# Patient Record
Sex: Female | Born: 1935 | Race: White | Hispanic: No | Marital: Married | Smoking: Never smoker
Health system: Southern US, Community
[De-identification: ages and names within clinical notes are randomized; demographics above are authoritative.]

## PROBLEM LIST (undated history)

## (undated) DIAGNOSIS — I4891 Unspecified atrial fibrillation: Secondary | ICD-10-CM

## (undated) DIAGNOSIS — J449 Chronic obstructive pulmonary disease, unspecified: Secondary | ICD-10-CM

## (undated) DIAGNOSIS — E079 Disorder of thyroid, unspecified: Secondary | ICD-10-CM

## (undated) DIAGNOSIS — M419 Scoliosis, unspecified: Secondary | ICD-10-CM

## (undated) DIAGNOSIS — N189 Chronic kidney disease, unspecified: Secondary | ICD-10-CM

## (undated) DIAGNOSIS — G43909 Migraine, unspecified, not intractable, without status migrainosus: Secondary | ICD-10-CM

## (undated) DIAGNOSIS — Z95810 Presence of automatic (implantable) cardiac defibrillator: Secondary | ICD-10-CM

## (undated) DIAGNOSIS — I340 Nonrheumatic mitral (valve) insufficiency: Secondary | ICD-10-CM

## (undated) DIAGNOSIS — E785 Hyperlipidemia, unspecified: Secondary | ICD-10-CM

## (undated) DIAGNOSIS — I472 Ventricular tachycardia, unspecified: Secondary | ICD-10-CM

## (undated) DIAGNOSIS — D649 Anemia, unspecified: Secondary | ICD-10-CM

## (undated) DIAGNOSIS — I499 Cardiac arrhythmia, unspecified: Secondary | ICD-10-CM

## (undated) DIAGNOSIS — G894 Chronic pain syndrome: Secondary | ICD-10-CM

## (undated) DIAGNOSIS — I5022 Chronic systolic (congestive) heart failure: Secondary | ICD-10-CM

## (undated) DIAGNOSIS — K900 Celiac disease: Secondary | ICD-10-CM

## (undated) DIAGNOSIS — Z950 Presence of cardiac pacemaker: Secondary | ICD-10-CM

## (undated) DIAGNOSIS — I420 Dilated cardiomyopathy: Secondary | ICD-10-CM

## (undated) DIAGNOSIS — L570 Actinic keratosis: Secondary | ICD-10-CM

## (undated) DIAGNOSIS — M810 Age-related osteoporosis without current pathological fracture: Secondary | ICD-10-CM

## (undated) DIAGNOSIS — J3489 Other specified disorders of nose and nasal sinuses: Secondary | ICD-10-CM

## (undated) DIAGNOSIS — I10 Essential (primary) hypertension: Secondary | ICD-10-CM

## (undated) DIAGNOSIS — H269 Unspecified cataract: Secondary | ICD-10-CM

## (undated) DIAGNOSIS — T7840XA Allergy, unspecified, initial encounter: Secondary | ICD-10-CM

## (undated) DIAGNOSIS — K219 Gastro-esophageal reflux disease without esophagitis: Secondary | ICD-10-CM

## (undated) DIAGNOSIS — E039 Hypothyroidism, unspecified: Secondary | ICD-10-CM

## (undated) DIAGNOSIS — I469 Cardiac arrest, cause unspecified: Secondary | ICD-10-CM

## (undated) DIAGNOSIS — R010 Benign and innocent cardiac murmurs: Secondary | ICD-10-CM

## (undated) DIAGNOSIS — Z5189 Encounter for other specified aftercare: Secondary | ICD-10-CM

## (undated) DIAGNOSIS — I359 Nonrheumatic aortic valve disorder, unspecified: Secondary | ICD-10-CM

## (undated) DIAGNOSIS — M799 Soft tissue disorder, unspecified: Secondary | ICD-10-CM

## (undated) DIAGNOSIS — M545 Low back pain, unspecified: Secondary | ICD-10-CM

## (undated) DIAGNOSIS — J984 Other disorders of lung: Secondary | ICD-10-CM

## (undated) DIAGNOSIS — Z4789 Encounter for other orthopedic aftercare: Secondary | ICD-10-CM

## (undated) DIAGNOSIS — R519 Headache, unspecified: Secondary | ICD-10-CM

## (undated) DIAGNOSIS — K259 Gastric ulcer, unspecified as acute or chronic, without hemorrhage or perforation: Secondary | ICD-10-CM

## (undated) DIAGNOSIS — M1990 Unspecified osteoarthritis, unspecified site: Secondary | ICD-10-CM

## (undated) DIAGNOSIS — I341 Nonrheumatic mitral (valve) prolapse: Secondary | ICD-10-CM

## (undated) DIAGNOSIS — I351 Nonrheumatic aortic (valve) insufficiency: Secondary | ICD-10-CM

## (undated) DIAGNOSIS — J349 Unspecified disorder of nose and nasal sinuses: Secondary | ICD-10-CM

## (undated) DIAGNOSIS — J392 Other diseases of pharynx: Secondary | ICD-10-CM

## (undated) DIAGNOSIS — I429 Cardiomyopathy, unspecified: Secondary | ICD-10-CM

## (undated) DIAGNOSIS — R0602 Shortness of breath: Secondary | ICD-10-CM

## (undated) DIAGNOSIS — R0609 Other forms of dyspnea: Secondary | ICD-10-CM

## (undated) DIAGNOSIS — H9390 Unspecified disorder of ear, unspecified ear: Secondary | ICD-10-CM

## (undated) DIAGNOSIS — R011 Cardiac murmur, unspecified: Secondary | ICD-10-CM

## (undated) DIAGNOSIS — R262 Difficulty in walking, not elsewhere classified: Secondary | ICD-10-CM

## (undated) DIAGNOSIS — H539 Unspecified visual disturbance: Secondary | ICD-10-CM

## (undated) DIAGNOSIS — I879 Disorder of vein, unspecified: Secondary | ICD-10-CM

## (undated) DIAGNOSIS — I509 Heart failure, unspecified: Secondary | ICD-10-CM

## (undated) DIAGNOSIS — J189 Pneumonia, unspecified organism: Secondary | ICD-10-CM

## (undated) HISTORY — DX: Age-related osteoporosis without current pathological fracture: M81.0

## (undated) HISTORY — DX: Hyperlipidemia, unspecified: E78.5

## (undated) HISTORY — DX: Unspecified cataract: H26.9

## (undated) HISTORY — PX: PACEMAKER IMPLANT: EP1218

## (undated) HISTORY — DX: Anemia, unspecified: D64.9

## (undated) HISTORY — DX: Cardiac arrhythmia, unspecified: I49.9

## (undated) HISTORY — DX: Unspecified atrial fibrillation: I48.91

## (undated) HISTORY — DX: Actinic keratosis: L57.0

## (undated) HISTORY — DX: Chronic kidney disease, unspecified: N18.9

## (undated) HISTORY — DX: Essential (primary) hypertension: I10

## (undated) HISTORY — DX: Other specified disorders of nose and nasal sinuses: J34.89

## (undated) HISTORY — DX: Scoliosis, unspecified: M41.9

## (undated) HISTORY — DX: Chronic pain syndrome: G89.4

## (undated) HISTORY — DX: Allergy, unspecified, initial encounter: T78.40XA

## (undated) HISTORY — DX: Chronic systolic (congestive) heart failure: I50.22

## (undated) HISTORY — DX: Ventricular tachycardia: I47.2

## (undated) HISTORY — PX: CORONARY ARTERY BYPASS GRAFT: SHX141

## (undated) HISTORY — DX: Celiac disease: K90.0

## (undated) HISTORY — DX: Ventricular tachycardia, unspecified: I47.20

## (undated) HISTORY — DX: Nonrheumatic mitral (valve) insufficiency: I34.0

## (undated) HISTORY — DX: Disorder of thyroid, unspecified: E07.9

## (undated) HISTORY — DX: Migraine, unspecified, not intractable, without status migrainosus: G43.909

## (undated) HISTORY — PX: OTHER SURGICAL HISTORY: SHX169

## (undated) HISTORY — DX: Dilated cardiomyopathy: I42.0

## (undated) HISTORY — PX: CARDIAC SURGERY: SHX584

## (undated) HISTORY — DX: Chronic obstructive pulmonary disease, unspecified: J44.9

## (undated) HISTORY — DX: Cardiac murmur, unspecified: R01.1

## (undated) HISTORY — DX: Other diseases of pharynx: J39.2

## (undated) HISTORY — DX: Soft tissue disorder, unspecified: M79.9

## (undated) HISTORY — DX: Nonrheumatic mitral (valve) prolapse: I34.1

## (undated) HISTORY — PX: CARDIAC CATHETERIZATION: SHX172

## (undated) HISTORY — DX: Low back pain, unspecified: M54.50

## (undated) HISTORY — DX: Unspecified osteoarthritis, unspecified site: M19.90

## (undated) HISTORY — PX: TONSILLECTOMY AND ADENOIDECTOMY: SUR1326

## (undated) HISTORY — DX: Difficulty in walking, not elsewhere classified: R26.2

## (undated) HISTORY — DX: Unspecified disorder of nose and nasal sinuses: J34.9

## (undated) HISTORY — PX: APPENDECTOMY (OPEN): SHX54

## (undated) HISTORY — DX: Gastro-esophageal reflux disease without esophagitis: K21.9

## (undated) HISTORY — PX: CARDIOVERSION: SHX1299

## (undated) HISTORY — PX: ADENOIDECTOMY: SUR15

## (undated) HISTORY — DX: Cardiomyopathy, unspecified: I42.9

## (undated) HISTORY — DX: Headache, unspecified: R51.9

## (undated) HISTORY — DX: Unspecified disorder of ear, unspecified ear: H93.90

## (undated) HISTORY — DX: Disorder of vein, unspecified: I87.9

## (undated) HISTORY — DX: Nonrheumatic aortic (valve) insufficiency: I35.1

## (undated) HISTORY — DX: Unspecified visual disturbance: H53.9

## (undated) HISTORY — DX: Hypothyroidism, unspecified: E03.9

---
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
## 1995-02-01 ENCOUNTER — Ambulatory Visit: Admit: 1995-02-01 | Disposition: A | Discharge status: Home / Self Care | Payer: Self-pay | Source: Ambulatory Visit

## 1999-10-08 ENCOUNTER — Ambulatory Visit
Admit: 1999-10-08 | Disposition: A | Discharge status: Home / Self Care | Payer: Self-pay | Source: Ambulatory Visit | Admitting: Adult Reconstructive Orthopaedic Surgery

## 1999-11-14 ENCOUNTER — Ambulatory Visit: Admission: RE | Admit: 1999-11-14 | Payer: Self-pay | Source: Ambulatory Visit | Admitting: Gastroenterology

## 2000-10-04 ENCOUNTER — Ambulatory Visit
Admission: RE | Admit: 2000-10-04 | Payer: Self-pay | Source: Ambulatory Visit | Admitting: Adult Reconstructive Orthopaedic Surgery

## 2001-05-14 ENCOUNTER — Emergency Department: Admit: 2001-05-14 | Payer: Self-pay | Source: Emergency Department | Admitting: Emergency Medicine

## 2006-12-17 ENCOUNTER — Ambulatory Visit
Admit: 2006-12-17 | Disposition: A | Discharge status: Home / Self Care | Payer: Self-pay | Source: Ambulatory Visit | Admitting: Gastroenterology

## 2006-12-17 LAB — BASIC METABOLIC PANEL
BUN: 22 mg/dL — ABNORMAL HIGH (ref 8–20)
CO2: 28 mEq/L (ref 21–30)
Calcium: 9.5 mg/dL (ref 8.6–10.2)
Chloride: 106 mEq/L (ref 98–107)
Creatinine: 1.1 mg/dL (ref 0.6–1.5)
Glucose: 77 mg/dL (ref 70–100)
Potassium: 4.4 mEq/L (ref 3.6–5.0)
Sodium: 140 mEq/L (ref 136–146)

## 2006-12-17 LAB — CBC- CERNER
Hematocrit: 36.2 % — ABNORMAL LOW (ref 37.0–47.0)
Hgb: 12.4 G/DL (ref 12.0–16.0)
MCH: 31.2 PG (ref 28.0–32.0)
MCHC: 34.2 G/DL (ref 32.0–36.0)
MCV: 91 FL (ref 80.0–100.0)
MPV: 8.2 FL (ref 7.4–10.4)
Platelets: 187 /mm3 (ref 140–400)
RBC: 3.98 /mm3 — ABNORMAL LOW (ref 4.20–5.40)
RDW: 16 % — ABNORMAL HIGH (ref 11.5–15.0)
WBC: 6.4 /mm3 (ref 3.5–10.8)

## 2006-12-17 LAB — GFR

## 2006-12-30 ENCOUNTER — Ambulatory Visit: Admission: RE | Admit: 2006-12-30 | Payer: Self-pay | Source: Ambulatory Visit | Admitting: Gastroenterology

## 2007-10-06 ENCOUNTER — Ambulatory Visit
Admit: 2007-10-06 | Disposition: A | Discharge status: Home / Self Care | Payer: Self-pay | Source: Ambulatory Visit | Admitting: Gastroenterology

## 2007-10-06 LAB — CBC AND DIFFERENTIAL
Basophils Absolute: 0 /mm3 (ref 0.0–0.2)
Basophils: 0 % (ref 0–2)
Eosinophils Absolute: 0.1 /mm3 (ref 0.0–0.7)
Eosinophils: 1 % (ref 0–5)
Granulocytes Absolute: 3.8 /mm3 (ref 1.8–8.1)
Hematocrit: 39.2 % (ref 37.0–47.0)
Hgb: 12.4 G/DL (ref 12.0–16.0)
Immature Granulocytes Absolute: 0
Immature Granulocytes: 0 %
Lymphocytes Absolute: 1.8 /mm3 (ref 0.5–4.4)
Lymphocytes: 30 % (ref 15–41)
MCH: 31.7 PG (ref 28.0–32.0)
MCHC: 31.6 G/DL — ABNORMAL LOW (ref 32.0–36.0)
MCV: 100.3 FL — ABNORMAL HIGH (ref 80.0–100.0)
MPV: 10.6 FL (ref 9.4–12.3)
Monocytes Absolute: 0.3 /mm3 (ref 0.0–1.2)
Monocytes: 6 % (ref 0–11)
Neutrophils %: 63 % (ref 52–75)
Platelets: 198 /mm3 (ref 140–400)
RBC: 3.91 /mm3 — ABNORMAL LOW (ref 4.20–5.40)
RDW: 13.6 % (ref 11.5–15.0)
WBC: 5.96 /mm3 (ref 3.50–10.80)

## 2007-10-06 LAB — BASIC METABOLIC PANEL
BUN: 22 mg/dL — ABNORMAL HIGH (ref 8–20)
CO2: 26 mEq/L (ref 21–30)
Calcium: 9.2 mg/dL (ref 8.6–10.2)
Chloride: 104 mEq/L (ref 98–107)
Creatinine: 1 mg/dL (ref 0.6–1.5)
Glucose: 94 mg/dL (ref 70–100)
Potassium: 4.9 mEq/L (ref 3.6–5.0)
Sodium: 141 mEq/L (ref 136–146)

## 2007-10-06 LAB — GFR

## 2007-10-13 ENCOUNTER — Ambulatory Visit: Admission: RE | Admit: 2007-10-13 | Payer: Self-pay | Source: Ambulatory Visit | Admitting: Gastroenterology

## 2008-12-03 ENCOUNTER — Ambulatory Visit
Admit: 2008-12-03 | Disposition: A | Discharge status: Home / Self Care | Payer: Self-pay | Source: Ambulatory Visit | Admitting: Adult Reconstructive Orthopaedic Surgery

## 2008-12-17 ENCOUNTER — Ambulatory Visit
Admit: 2008-12-17 | Disposition: A | Discharge status: Home / Self Care | Payer: Self-pay | Source: Ambulatory Visit | Admitting: Anesthesiology

## 2009-07-01 ENCOUNTER — Ambulatory Visit
Admit: 2009-07-01 | Disposition: A | Discharge status: Home / Self Care | Payer: Self-pay | Source: Ambulatory Visit | Admitting: Adult Reconstructive Orthopaedic Surgery

## 2009-07-23 ENCOUNTER — Ambulatory Visit
Admit: 2009-07-23 | Disposition: A | Discharge status: Home / Self Care | Payer: Self-pay | Source: Ambulatory Visit | Admitting: Neurological Surgery

## 2011-03-22 LAB — ECG 12-LEAD
Atrial Rate: 70 {beats}/min
P Axis: 41 degrees
P-R Interval: 164 ms
Q-T Interval: 414 ms
QRS Duration: 98 ms
QTC Calculation (Bezet): 447 ms
R Axis: -26 degrees
T Axis: 81 degrees
Ventricular Rate: 70 {beats}/min

## 2011-04-08 ENCOUNTER — Ambulatory Visit
Admission: RE | Admit: 2011-04-08 | Disposition: A | Discharge status: Home / Self Care | Payer: Self-pay | Source: Ambulatory Visit | Attending: Gastroenterology | Admitting: Gastroenterology

## 2011-04-08 ENCOUNTER — Ambulatory Visit: Payer: Self-pay

## 2011-06-09 NOTE — Op Note (Signed)
MRN: [id number] DOCUMENT ID: [id number]      INTRODUCTION:      77 YEAR OLD FEMALE PATIENT PRESENTS FOR AN EGD.  THE INDICATION FOR THE      PROCEDURE WAS IDA. PATIENT UNDERWENT EVALUATION WITH EGD AND COLONOSCOPY      UNREVEALING AND VCE WHICH REMAINED IN THE STOMACH FOR RECORDING TIME NOW      FOR ENDOSCOPIC PLACEMENT OF VCE.      CONSENT:      THE BENEFITS, RISKS, AND ALTERNATIVES TO THE PROCEDURE WERE DISCUSSED AND      INFORMED CONSENT WAS OBTAINED.      PREPARATION:      EKG, PULSE, PULSE OXIMETRY AND BLOOD PRESSURE MONITORED.      MEDICATIONS:      ANESTHESIA-IVA      PROCEDURE:      THE ENDOSCOPE WAS PASSED WITH EASE UNDER DIRECT VISUALIZATION TO THE 3RD      PORTION OF THE DUODENUM.  RETROFLEXION WAS PERFORMED.  THE STUDY WAS      PERFORMED WITH A GIF-H180.      FINDINGS:      ESOPHAGUS: THER WAS MILD DISTAL ESOPHAGEAL ERYTHEMA WITH A FEW EROSIONS AT      THE SCJ CONSISTANT WITH EROSIVE ESOPHAGITIS. THE SCJ APPEARED REGULAR,      HOWEVER, DISPLACED PROXIMALLY 1-2 CM. BIOPSIES WERE NOT OBTAINED AS NOT TO      INTERFERE WITH VCE READING.  THE ESOPHAGUS WAS OTHERWISE NORMAL.      STOMACH: THERE WAS A SMALL HIATAL HERNIA.  THE STOMACH WAS OTHERWISE      NORMAL.      DUODENUM: THE DUODENUM APPEARED NORMAL.  THE ENDOSCOPE WAS WITHDRAWAN AND      THE [place] ENDOSCOPY VCE DEPLOYMENT DEVICE ASSEMBLED. THE ACTIVATED VCE WAS      ATTACHED AND ADEQUATELY LUBRICATED. THE ENDOSCOPE WITH VCE WAS PASSED WITH      CAUTION INTO THE THE ESOPHAGUS WITHOUT RESISTANCE. THE VCE WAS THEN      ADVACED ALLOWING DIRECT VISUALIZATION. THE ENDOSCOPE WAS ADVACED TO THE      2ND PORTION OF DUODENUM AND THE VCE DEPOLYED. THE EDOSCOPE WAS WITHDRAWAN.      COMPLICATIONS:      THERE WERE NO COMPLICATIONS ASSOCIATED WITH THE PROCEDURE.      IMPRESSION:      1.  THER WAS MILD DISTAL ESOPHAGEAL ERYTHEMA WITH A FEW EROSIONS AT THE      SCJ CONSISTANT WITH EROSIVE ESOPHAGITIS. THE SCJ APPEARED REGULAR,      HOWEVER, DISPLACED 1-2 CM PROXIMALLY. BIOPSIES  WERE NOT OBTAINED AS NOT TO      INTERFERE WITH VCE READING.      2.  SMALL HIATAL HERNIA. [553.3].      3.  THE DUODENUM APPEARED NORMAL.  THE ENDOSCOPE WAS WITHDRAWAN AND THE [place]      ENDOSCOPY VCE DEPLOYMENT DEVICE ASSEMBLED. THE ACTIVATED VCE WAS ATTACHED      AND ADEQUATELY LUBRICATED. THE ENDOSCOPE WITH VCE WAS PASSED WITH CAUTION      INTO THE THE ESOPHAGUS WITHOUT RESISTANCE. THE VCE WAS THEN ADVACED      ALLOWING DIRECT VISUALIZATION. THE ENDOSCOPE WAS ADVACED TO THE 2ND      PORTION OF DUODENUM AND THE VCE DEPOLYED. THE EDOSCOPE WAS WITHDRAWAN.      RECOMMENDATION:      - VCE.      - PATIENT INSTRUCTED TO FOLLOW UP WITH DR Reola Calkins FOR FURTHER EVALUATION AND  MANAGEMENT OF IDA, EROSIVE ESOPHAGITIS, AND CONSIDER REPEAT EGD AFTER HI      DOSE PPI THERAPY WITH BIOPSIES OF DISTAL ESOPHAGUS.      SIGNING PHYSICIAN: Marge Duncans

## 2011-06-09 NOTE — Op Note (Signed)
Introduction:[id number] Document ID: [id number] -- 77 year old female      patient presents for an outpatient Esophagogastroduodenoscopy on      04/08/2011.            Indications: GERD (530.81). Iron deficiency anemia (280.9). History of      celiac disease.            Consent: The benefits, risks, and alternatives to the procedure were      discussed and informed consent was obtained from the patient.            Preparation: EKG, pulse, pulse oximetry, and blood pressure were monitored      throughout the procedure.            Medications: IVA anesthesia.            Procedure: The gastroscope was passed through the mouth under direct      visualization and was advanced with ease to the 2nd portion of the      duodenum. The scope was withdrawn and the mucosa was carefully examined.      The views were excellent. The patient's toleration of the procedure was      excellent.            Estimated Blood Loss: Negligible.            Findings:   Esophagus: There was mild, distal, esophageal erythema. The      SCJ appeared regular at level of GEJ. A biopsy was taken from the GE      junction and distal third of the esophagus.  Stomach: Erythematous mucosa      was found in the antrum. Otherwise, the stomach appeared to be normal. A      biopsy was taken from the antrum, body of the stomach, and fundus.      Duodenum: The duodenum appeared to be normal. 6 biopsies were taken from      the 3rd part of the duodenum, 2nd portion of the duodenum, and bulb.            Unplanned Events: There were no unplanned events.            Summary: There was mild, distal, esophageal erythema. The SCJ appeared      regular at level of GEJ. Erythematous mucosa was found in the antrum.      Normal duodenum. 6 biopsies taken.            Recommendations: Call office in 7-10 for pathology results. Diet and      lifesyle modifications. PPI po qam as needed. Proceed with colonosocpy.      Follow up in office after endoscopic evaluation complete.  Follow up with      Dr. Pecola Leisure as directed.            Procedure Codes: [43239]EGD with biopsy            Performed By: The procedure was performed by Halina Andreas, MD.      Version 1, electronically signed by Dr. Marge Duncans on 04/08/2011 at 11:08.      D:/[email or web address]

## 2011-06-09 NOTE — Op Note (Signed)
Introduction: [id number] Document ID: [id number] -- 77 year old female      patient presents for an outpatient Colonoscopy on 04/08/2011.            Indications: Iron deficiency anemia (280.9).            Consent: The benefits, risks, and alternatives to the procedure were      discussed and informed consent was obtained from the patient.            Preparation: EKG, pulse, pulse oximetry, and blood pressure were monitored      throughout the procedure.            Medications: IVA anesthesia.            Rectal Exam: Normal rectal exam.            Procedure: The colonoscope was passed through the anus under direct      visualization and was advanced with ease to the cecum, confirmed by      landmarks. The scope was withdrawn and the mucosa was carefully examined.      The quality of the preparation was excellent. The views were excellent.      The patient's toleration of the procedure was excellent. Retroflexion was      performed in the rectum.            Estimated Blood Loss: Negligible.            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 The AVMs were      ablated using APC, standard protocol, with excellent eschar formation and      no bleeding. The TI was intubated and appeared normal for 10 cm. There      were few tiny early diverticula in the sigmoid colon. Medium-sized      internal hemorrhoids were found. Otherwise, the colon appeared to be      normal.            Unplanned Events: There were no unplanned events.            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 The AVMs were      ablated using APC, standard protocol, with excellent eschar formation and      no bleeding. The TI was intubated and appeared normal for 10 cm. There      were few tiny early diverticula in the sigmoid colon. Internal hemorrhoids      found (455.0).            Recommendations: VCE to further evaluate for small bowel mucosal pathology      including AVMs. Follow up in office after VCE.  Follow up with Dr. Pecola Leisure as      directed.            Procedure Codes: [45382]Colonoscopy with control of bleeding            Performed By: The procedure was performed by Halina Andreas, MD.      Version 1, electronically signed by Dr. Marge Duncans on 04/08/2011 at 11:15.      D:/[id number]

## 2011-06-09 NOTE — Op Note (Signed)
MRN: [id number] DOCUMENT ID: [id number]      INTRODUCTION:      77 YEAR OLD FEMALE PATIENT PRESENTS FOR AN ELECTIVE OUTPATIENT      COLONOSCOPY.  THE INDICATION FOR THE PROCEDURE WAS IRON DEFICIENCY ANEMIA.      CONSENT:      THE BENEFITS, RISKS, AND ALTERNATIVES TO THE PROCEDURE WERE DISCUSSED AND      INFORMED CONSENT WAS OBTAINED.      PREPARATION:      EKG, PULSE, PULSE OXIMETRY AND BLOOD PRESSURE MONITORED.      MEDICATIONS:      ANESTHESIA-IVA      - PROPOFOL      - ROBINUL DURING THE PROCEDURE      THE ENDOSCOPE WAS PASSED WITH A MODERATE AMOUNT OF DIFFICULTY THROUGH A      VERY TORTUOUS COLON UNDER DIRECT VISUALIZATION TO THE TERMINAL ILEUM      CONFIRMED BY LANDMARKS.  THE STUDY WAS PERFORMED WITH A CF-H180AL AND      CF-H180AL.  THE QUALITY OF THE PREPARATION WAS EXCELLENT.  RETROFLEXION      WAS PERFORMED IN THE RECTUM.  COLONOSCOPE WITHDRAWAL TIME WAS 7 MINUTES.      FINDINGS:  NORMAL VASCULAR PATTERN WITH NO INFLAMMATION.  NO POLYPS OR AVM      WERE SEEN.  MODERATE INTERNAL HEMORRHOIDS WERE PRESENT.  THE COLONOSCOPY      WAS OTHERWISE NORMAL.      COMPLICATIONS:      THERE WERE NO COMPLICATIONS ASSOCIATED WITH THE PROCEDURE.      IMPRESSION:      1.  NORMAL VASCULAR PATTERN WITH NO INFLAMMATION.      2.  NO POLYPS OR AVM WERE SEEN.      3.  MODERATE INTERNAL HEMORRHOIDS WERE PRESENT [455.0].      4.  COLONOSCOPY, OTHERWISE NORMAL.      5.  NO CAUSE FOR IRON DEFICIENCY ANEMIA SEEN.      RECOMMENDATION:      - REPEAT COLONOSCOPY IN 7 TO 10 YEARS.      SIGNING PHYSICIAN: Margalit Leece S

## 2011-06-09 NOTE — Op Note (Signed)
MRN: [id number] DOCUMENT ID: [id number]      INTRODUCTION:      77 YEAR OLD FEMALE PATIENT PRESENTS FOR AN ELECTIVE OUTPATIENT EGD.  THE      INDICATIONS FOR THE PROCEDURE WERE IRON DEFICIENCY ANEMIA AND CELIAC      DISEASE.      CONSENT:      THE BENEFITS, RISKS, AND ALTERNATIVES TO THE PROCEDURE WERE DISCUSSED AND      INFORMED CONSENT WAS OBTAINED.      PREPARATION:      EKG, PULSE, PULSE OXIMETRY AND BLOOD PRESSURE MONITORED.      MEDICATIONS:      ANESTHESIA-IVA      - PROPOFOL      PROCEDURE:      THE ENDOSCOPE WAS PASSED WITH EASE UNDER DIRECT VISUALIZATION TO THE 2ND      PORTION OF THE DUODENUM.  THE STUDY WAS PERFORMED WITH A H180 AND      CF-H180AL.  RETROFLEXION WAS PERFORMED IN THE CARDIA OF THE STOMACH.      FINDINGS:      ESOPHAGUS: MILD ERYTHEMA IN DISTAL ESOPHAGUS.  BIOPSIES TAKEN TO EVALUATE      FOR INFLAMMATION AND REFLUX CHANGES.  THE ESOPHAGUS WAS OTHERWISE NORMAL.      GE-JUNCTION: AT 39 CM FROM THE GUMS, THE GASTROESOPHAGEAL JUNCTION      APPEARED NORMAL.      STOMACH: NORMAL MUCOSA IN THE CARDIA, FUNDUS AND BODY WITH MILD CHANGES IN      THE ANTRUM SUGGESTIVE OF GASTRITIS.  NO ULCERS OR EROSIONS SEEN.  BIOPSIES      TAKEN IN THE BODY AND ANTRUM FOR UREASE AND IN THE ANTRUM FOR HISTOLOGY.      THE STOMACH WAS OTHERWISE NORMAL.      DUODENUM: NORMAL BULB.  PATCHY ERYTHEMA IN THE 2ND PORTION, VILLI APPEAR      NORMAL.  BIOPSIES TAKEN.  THE DUODENUM WAS OTHERWISE NORMAL.      COMPLICATIONS:      THERE WERE NO COMPLICATIONS ASSOCIATED WITH THE PROCEDURE.      IMPRESSION:      1.  MILD ERYTHEMA IN DISTAL ESOPHAGUS.  BIOPSIES TAKEN TO EVALUATE FOR      INFLAMMATION AND REFLUX CHANGES.      2.  THE GASTROESOPHAGEAL JUNCTION APPEARED NORMAL.      3.  NORMAL MUCOSA IN THE CARDIA, FUNDUS AND BODY WITH MILD CHANGES IN THE      ANTRUM SUGGESTIVE OF GASTRITIS.  NO ULCERS OR EROSIONS SEEN.  BIOPSIES      TAKEN IN THE BODY AND ANTRUM FOR UREASE AND IN THE ANTRUM FOR HISTOLOGY.      4.  NORMAL DUODENAL BULB.       5.  PATCHY ERYTHEMA IN THE 2ND PORTION OF THE DUODENUM, VILLI APPEAR      NORMAL.  BIOPSIES TAKEN.      RECOMMENDATION:      - FOLLOW-UP ON THE RESULTS OF BIOPSY SPECIMENS IN 2 WEEKS.      SIGNING PHYSICIAN: Andrej Spagnoli S

## 2011-07-29 ENCOUNTER — Other Ambulatory Visit: Payer: Self-pay | Admitting: Cardiovascular Disease

## 2011-07-29 DIAGNOSIS — I4891 Unspecified atrial fibrillation: Secondary | ICD-10-CM

## 2011-08-04 ENCOUNTER — Encounter: Payer: Self-pay | Admitting: Anesthesiology

## 2011-08-04 ENCOUNTER — Ambulatory Visit
Admission: RE | Admit: 2011-08-04 | Discharge: 2011-08-04 | Disposition: A | Discharge status: Home / Self Care | Payer: Medicare Other | Source: Ambulatory Visit | Attending: Cardiovascular Disease | Admitting: Cardiovascular Disease

## 2011-08-04 DIAGNOSIS — I4891 Unspecified atrial fibrillation: Secondary | ICD-10-CM | POA: Insufficient documentation

## 2011-08-04 DIAGNOSIS — M419 Scoliosis, unspecified: Secondary | ICD-10-CM | POA: Insufficient documentation

## 2011-08-04 DIAGNOSIS — Q211 Atrial septal defect: Secondary | ICD-10-CM | POA: Insufficient documentation

## 2011-08-04 DIAGNOSIS — K900 Celiac disease: Secondary | ICD-10-CM | POA: Insufficient documentation

## 2011-08-04 DIAGNOSIS — Q2111 Secundum atrial septal defect: Secondary | ICD-10-CM | POA: Insufficient documentation

## 2011-08-04 HISTORY — DX: Celiac disease: K90.0

## 2011-08-04 HISTORY — DX: Disorder of thyroid, unspecified: E07.9

## 2011-08-04 HISTORY — DX: Hyperlipidemia, unspecified: E78.5

## 2011-08-04 HISTORY — DX: Benign and innocent cardiac murmurs: R01.0

## 2011-08-04 HISTORY — DX: Nonrheumatic aortic valve disorder, unspecified: I35.9

## 2011-08-04 HISTORY — DX: Shortness of breath: R06.02

## 2011-08-04 HISTORY — DX: Scoliosis, unspecified: M41.9

## 2011-08-04 HISTORY — DX: Cardiac arrhythmia, unspecified: I49.9

## 2011-08-04 MED ORDER — LACTATED RINGERS IV SOLN
INTRAVENOUS | Status: DC | PRN
Start: 2011-08-04 — End: 2012-06-29

## 2011-08-04 MED ORDER — FENTANYL CITRATE 0.05 MG/ML IJ SOLN
INTRAMUSCULAR | Status: DC | PRN
Start: 2011-08-04 — End: 2012-06-29
  Administered 2011-08-04: 50 ug via INTRAVENOUS

## 2011-08-04 MED ORDER — PROPOFOL 10 MG/ML IV EMUL
INTRAVENOUS | Status: DC | PRN
Start: 2011-08-04 — End: 2012-06-29
  Administered 2011-08-04: 30 mg via INTRAVENOUS
  Administered 2011-08-04 (×2): 20 mg via INTRAVENOUS
  Administered 2011-08-04: 40 mg via INTRAVENOUS

## 2011-08-04 NOTE — Anesthesia Postprocedure Evaluation (Signed)
Anesthesia Post Evaluation    Patient: [name]    Procedures performed: TEE by Cardiology, Ext [place] cardioversion    Anesthesia type: General TIVA    Patient location:PACU    Last vitals:   Filed Vitals:    08/04/11 0910   BP: 118/71   Pulse: 80   Resp: 28       Post pain: Patient not complaining of pain, continue current therapy     Mental Status:awake and alert     Respiratory Function: tolerating room air    Cardiovascular: stable    Nausea/Vomiting: patient not complaining of nausea or vomiting    Hydration Status: adequate    Post assessment: no apparent anesthetic complications, no reportable events and no evidence of recall

## 2011-08-04 NOTE — Transfer of Care (Signed)
Anesthesia Transfer of Care Note    Patient: [name]    Procedures performed: TEE by Cardiology, ext cardioversion    Anesthesia type: General TIVA    Patient location:PACU    Post pain: Patient not complaining of pain, continue current therapy     Mental Status:sedated    Respiratory Function: toleratinig nasal cannula    Cardiovascular: stable    Nausea/Vomiting: patient not complaining of nausea or vomiting    Hydration Status: adequate    Post assessment: no reportable events

## 2011-08-04 NOTE — Anesthesia Preprocedure Evaluation (Signed)
Anesthesia Evaluation    AIRWAY    Mallampati: II    TM distance: >3 FB       CARDIOVASCULAR    regular and normal     DENTAL    No notable dental hx       PULMONARY    pulmonary exam normal and clear to auscultation     OTHER FINDINGS          Anesthesia Plan    ASA 2   general   Detailed anesthesia plan: general IV        intravenous induction   informed consent obtained        <IHSANLANDD>

## 2011-08-04 NOTE — Procedures (Signed)
Echo  Findings  No left atrial thrombus,nl left ventricular function  Small pfo with right to left and left to right shunting  Mild aortic insufficiency,mild mitral regurgitation    Cardioversion performed with 200   joules pt had brief run of svt  Then reestablished sinus rhythm.  Discharge to home with same meds.

## 2011-08-04 NOTE — Progress Notes (Signed)
Tee/Cv performed by Dr. Denton Lank pt converted to sinus brady after one shock 200j.short run of self limited svt noted ; Dr. Denton Lank updated husband post procedure to review poc and follow up. Pt recovered and [place] instructions review with patient and husband; pt [place] per wheelchair to car.at 0930

## 2011-08-04 NOTE — Discharge Instructions (Addendum)
Post Anesthesia Discharge Instructions    Although you may be awake and alert in the recovery room, small amounts of anesthetic remain in your system for about 24 hours.  You may feel tired and sleepy during this time.      You are advised to go directly home from the hospital.    Plan to stay at home and rest for the remainder of the day.    It is advisable to have someone with you at home for 24 hours after surgery.    Do not operate a motor vehicle, or any mechanical or electrical equipment for the next 24 hours.      Be careful when you are walking around, you may become dizzy.  The effects of anesthesia and/or medications are still present and drowsiness may occur    Do not consume alcohol, tranquilizers, sleeping medications, or any other non prescribed medication for the remainder of the day.    Diet:  begin with liquids, progress your diet as tolerated or as directed by your surgeon.  Nausea and vomiting may occur in the next 24 hours.  Continue current medications. No restrictio  Discharge Instructions for Pacemaker Implantation  You have had a procedure to insert a pacemaker. Once inside your body, this small electronic device helps keep your heart from beating too slowly. A pacemaker can't fix existing heart problems. But it can help you feel better and have more energy. As you recover, follow all of the instructions you are given, including those below.  Activity   Don't drive until your doctor says it's okay.   Follow the instructions you are given about limiting your activity.   If you are fitted with an arm sling, keep your arm in the sling for 24 hours.   Do not raise your arm on the incision side above shoulder level for 10 days. This gives the device lead wires time to attach securely inside your heart.   Ask your doctor when you can expect to return to work.   You can still exercise. It's good for your body and your heart. Talk with your doctor about an exercise plan.  Other  Precautions   Change your dressing as often as your doctor instructs. Avoid getting the area wet for about a week.   Every day, take your temperature and check your incision for signs of infection (redness, swelling, drainage, or warmth). Do this for 7days.   Learn to take your own pulse. Keep a record of your results. Ask your doctor what pulse rate means you should call for medical attention.   Before you receive any treatment, tell all healthcare providers (including your dentist) that you have a pacemaker.   Carry an ID card that contains information about your pacemaker. You can show this card if yourpacemaker sets off a metal detector. You should also show it to avoid screening with a hand-held security wand.   Keep your cell phone away from your pacemaker. Don't carry the phone in your shirt pocket, even when it's turned off.   Avoid strong magnets. Examples are those used in MRIs or in hand-held security wands.   Avoid strong electrical fields. Examples are those made by radio transmitting towers,"ham"radios, and Geophysicist/field seismologist equipment.   Avoid leaning over the open hood of a running car. A running engine creates an Civil engineer, contracting.  Follow-Up   See your cardiologist in the next 7-10 days. Call and make an appointment as soon as you get home.  Make regular follow-up appointments with your doctor. He or she will check the pacemaker to make sure it's working properly.  When to Call Your Doctor  Call your doctor immediately if you have any of the following:   Dizziness   Chest pain   Lack of energy   Fainting spells   Twitching chest muscles   Rapid pulse or pounding heartbeat   Shortness of breath   Pain around your pacemaker  Fever above100.4For other signs of infection (redness, swe  Discharge Instructions for Pacemaker Implantation  You have had a procedure to insert a pacemaker. Once inside your body, this small electronic device helps keep your heart from beating too  slowly. A pacemaker can't fix existing heart problems. But it can help you feel better and have more energy. As you recover, follow all of the instructions you are given, including those below.  Activity  Don't drive until your doctor says it's okay.  Follow the instructions you are given about limiting your activity.  If you are fitted with an arm sling, keep your arm in the sling for 24 hours.  Do not raise your arm on the incision side above shoulder level for 10 days. This gives the device lead wires time to attach securely inside your heart.  Ask your doctor when you can expect to return to work.  You can still exercise. It's good for your body and your heart. Talk with your doctor about an exercise plan.  Other Precautions  Change your dressing as often as your doctor instructs. Avoid getting the area wet for about a week.  Every day, take your temperature and check your incision for signs of infection (redness, swelling, drainage, or warmth). Do this for 7days.  Learn to take your own pulse. Keep a record of your results. Ask your doctor what pulse rate means you should call for medical attention.  Before you receive any treatment, tell all healthcare providers (including your dentist) that you have a pacemaker.  Carry an ID card that contains information about your pacemaker. You can show this card if yourpacemaker sets off a metal detector. You should also show it to avoid screening with a hand-held security wand.  Keep your cell phone away from your pacemaker. Don't carry the phone in your shirt pocket, even when it's turned off.  Avoid strong magnets. Examples are those used in MRIs or in hand-held security wands.  Avoid strong electrical fields. Examples are those made by radio transmitting towers,"ham"radios, and Geophysicist/field seismologist equipment.  Avoid leaning over the open hood of a running car. A running engine creates an Civil engineer, contracting.  Follow-Up  See your cardiologist in the next 7-10 days.  Call and make an appointment as soon as you get home.  Make regular follow-up appointments with your doctor. He or she will check the pacemaker to make sure it's working properly.  When to Call Your Doctor  Call your doctor immediately if you have any of the following:  Dizziness  Chest pain  Lack of energy  Fainting spells  Twitching chest muscles  Rapid pulse or pounding heartbeat  Shortness of breath  Pain around your pacemaker  Fever above100.4For other signs of infection (redness, swelling, drainage, or warmth at the incision site)  Hiccups that won't stop    [address] [place], [place] [address]. All rights reserved. This information is not intended as a substitute for professional medical care. Always follow your healthcare professional's instructions.  MEDICATION:  DIGOXIN  Digoxin (brand names: Lanoxin, Digitek) helps strengthen the heart and controls an irregular heartbeat (atrial fibrillation). It also helps relieve swelling of the legs and shortness of breath due to congestive heart failure.  DIRECTIONS FOR USE:  Digoxin must be taken at regular times with or without food, once a day, every day. Do not stop taking this medicine without consulting your doctor since it might make your condition worse.  WHAT TO WATCH FOR:  POSSIBLE SIDE EFFECTS: Nausea, vomiting, loss of appetite, visual changes, drowsiness, headache, confusion, depression, weakness, slow pulse (If these symptoms occur, contact your doctor).  MEDICAL CONDITIONS: Before starting this medicine, be sure your doctor knows if you have any of the following conditions:  Pregnancy, breastfeeding, kidney disease  DRUG INTERACTIONS: Before starting this medicine, be sure your doctor knows if you are taking any of the following drugs:  Antacids, Kaopectate, milk of Magnesia, Azulfidine (sulfasalazine), Questran (cholestyramine), Colestid (colestipol)  WARNING:  Do not drive, ride a bicycle, or operate dangerous  equipment while taking this medicine until you know how it will affect you.  [NOTE: This information topic may not include all directions, precautions, medical conditions, drug/food interactions and warnings for this drug. Check with your doctor, nurse, or pharmacist for any questions that you may have.]   2000-2011 [employer], [address], [place], [place] [address]. All rights reserved. This information is not intended as a substitute for professional medical care. Always follow your healthcare professional's instructions.  MEDICATION: DIGOXIN  Digoxin (brand names: Lanoxin, Digitek) helps strengthen the heart and controls an irregular heartbeat (atrial fibrillation). It also helps relieve swelling of the legs and shortness of breath due to congestive heart failure.  DIRECTIONS FOR USE:  Digoxin must be taken at regular times with or without food, once a day, every day. Do not stop taking this medicine without consulting your doctor since it might make your condition worse.  WHAT TO WATCH FOR:  POSSIBLE SIDE EFFECTS: Nausea, vomiting, loss of appetite, visual changes, drowsiness, headache, confusion, depression, weakness, slow pulse (If these symptoms occur, contact your doctor).  MEDICAL CONDITIONS: Before starting this medicine, be sure your doctor knows if you have any of the following conditions:  Pregnancy, breastfeeding, kidney disease  DRUG INTERACTIONS: Before starting this medicine, be sure your doctor knows if you are taking any of the following drugs:  Antacids, Kaopectate, milk of Magnesia, Azulfidine (sulfasalazine), Questran (cholestyramine), Colestid (colestipol)  WARNING:  Do not drive, ride a bicycle, or operate dangerous equipment while taking this medicine until you know how it will affect you.  [NOTE: This information topic may not include all directions, precautions, medical conditions, drug/food interactions and warnings for this drug. Check with your doctor, nurse, or pharmacist for  any questions that you may have.]    2000-2011 [employer], [address], [place], [place] [address]. All rights reserved. This information is not intended as a substitute for professional al's iHiccups that won't stop    [address], [address], [place], [place] [address]. All rights reserved. This information is not intended as a substitute for professional medical care. Always follow your healthcare professional's instructions.n

## 2011-08-06 LAB — ECG 12-LEAD
Atrial Rate: 89 {beats}/min
Atrial Rate: 120 {beats}/min
Atrial Rate: 66 {beats}/min
P Axis: 76 degrees
P Axis: 83 degrees
P-R Interval: 212 ms
P-R Interval: 272 ms
Q-T Interval: 354 ms
Q-T Interval: 394 ms
Q-T Interval: 410 ms
QRS Duration: 102 ms
QRS Duration: 106 ms
QRS Duration: 108 ms
QTC Calculation (Bezet): 423 ms
QTC Calculation (Bezet): 413 ms
QTC Calculation (Bezet): 515 ms
R Axis: -47 degrees
R Axis: -42 degrees
R Axis: -50 degrees
T Axis: 27 degrees
T Axis: 49 degrees
T Axis: 75 degrees
Ventricular Rate: 86 {beats}/min
Ventricular Rate: 66 {beats}/min
Ventricular Rate: 95 {beats}/min

## 2012-06-13 NOTE — Op Note (Unsigned)
DATE OF SURGERY:                    10/04/2000            SURGEON:                            Aggie Moats, MD            ASSISTANT(S):                  PREOPERATIVE DIAGNOSIS:  TORN LATERAL MENISCUS, LEFT KNEE.            POSTOPERATIVE  DIAGNOSIS:   PARTIAL   ANTERIOR   CRUCIATE   LIGAMENT  TEAR,      DEGENERATIVE  TEAR  LATERAL  MENISCUS  AND  CHONDROMALACIA  MEDIAL  FEMORAL      CONDYLE, LEFT KNEE.            TITLE OF PROCEDURE:  LEFT KNEE ARTHROSCOPY,  PARTIAL  LATERAL MENISCECTOMY,      PARTIAL LATERAL MENISCECTOMY.            ANESTHESIA:  LMA.            INDICATIONS:   The  patient  is  a  78   year   old,  who  was  well  until      approximately a year ago when she began complaining of left knee pain which      was  made  worse  by  aerobics.   She ultimately  underwent  an  MRI  which      demonstrated possible tears of both her  lateral  and  medial menisci.  She      elected not to proceed with surgery  at  that  time.   However  she  is now      anticipating a walking trip around  [place]  and  decided  to  proceed  with      arthroscopic  surgery,  and after careful  explanation  of  the  risks  and      benefits,  including  infection,  blood   clots,   pulmonary  embolism  and      anesthetic reaction, she elected to proceed with surgery and is now brought      to the operating room for the above procedure.            DESCRIPTION OF PROCEDURE:  After comfortably  positioning  on the operating      room  table  in  the  supine position,  after  induction  of  adequate  LMA      anesthesia, the patient's left leg was  prepped  and  draped  in  the usual      sterile fashion.  Standard portals were made with the scope, inflow through      inferolateral  portal,  instrumentation  through  inferomedial  portal  and      outflow through superior medial portal.   A  survey  of the joint was made.      The  patellofemoral  joint  was  relatively   pristine   with   some   mild      degenerative fraying of  both the patella and the trochlear area.  The scope  was passed into the medial compartment.   The  posterior horn of the medial      meniscus was well seen, suggesting an  ACL  tear.   The  rest of the medial      meniscus was intact to inspection and probing.  There was some mild fraying      of the articular cartilage of the medial  femoral  condyle, adjacent to the      notch.   The  scope  was  passed into  the  notch.  The  anterior  cruciate      ligament, particularly at the anterior  fibers was somewhat lax to probing.      The scope was passed into the lateral  compartment.   The  popliteus tendon      was well seen.  The articular cartilage  of the lateral femoral condyle and      lateral tibial plateau was pristine.   There  was  noted to be degenerative      fraying of the lateral meniscus.  This  was  debrided  using a shaver.  The      joint was then irrigated and closure  was  begun. The skin was closed using      interrupted 3-0 nylon stitches, approximately  30  cc of 0.5% Marcaine were      instilled in the joint.            A  dry sterile dressing was applied.  The  patient  was  extubated  in  the      operating room, transferred to a stretcher and taken to the to the recovery      room in satisfactory condition.  It should  be noted that 30 cc of Marcaine      were instilled in the joint.                                                        _____________________________________                                            _____                                            Aggie Moats, MD      [id number]      D: 10/04/2000 5:07 P      T: 10/05/2000 11:50 A      J: [id number]      N: [id number]      CC: Excell Seltzer, MD         Aggie Moats, MD

## 2012-06-27 ENCOUNTER — Other Ambulatory Visit: Payer: Self-pay | Admitting: Cardiovascular Disease

## 2012-06-27 DIAGNOSIS — I059 Rheumatic mitral valve disease, unspecified: Secondary | ICD-10-CM

## 2012-06-29 ENCOUNTER — Encounter: Payer: Self-pay | Admitting: Anesthesiology

## 2012-06-29 ENCOUNTER — Ambulatory Visit
Admission: RE | Admit: 2012-06-29 | Discharge: 2012-06-29 | Disposition: A | Discharge status: Home / Self Care | Payer: Medicare Other | Source: Ambulatory Visit | Attending: Cardiovascular Disease | Admitting: Cardiovascular Disease

## 2012-06-29 VITALS — BP 150/70 | HR 88 | Resp 34 | Ht 65.0 in | Wt 150.0 lb

## 2012-06-29 DIAGNOSIS — I059 Rheumatic mitral valve disease, unspecified: Secondary | ICD-10-CM | POA: Insufficient documentation

## 2012-06-29 DIAGNOSIS — I4891 Unspecified atrial fibrillation: Secondary | ICD-10-CM | POA: Insufficient documentation

## 2012-06-29 DIAGNOSIS — Q2111 Secundum atrial septal defect: Secondary | ICD-10-CM | POA: Insufficient documentation

## 2012-06-29 DIAGNOSIS — I079 Rheumatic tricuspid valve disease, unspecified: Secondary | ICD-10-CM | POA: Insufficient documentation

## 2012-06-29 DIAGNOSIS — I517 Cardiomegaly: Secondary | ICD-10-CM | POA: Insufficient documentation

## 2012-06-29 LAB — BASIC METABOLIC PANEL
BUN: 22 mg/dL — ABNORMAL HIGH (ref 7.0–19.0)
CO2: 23 mEq/L (ref 22–29)
Calcium: 9.6 mg/dL (ref 7.9–10.6)
Chloride: 106 mEq/L (ref 98–107)
Creatinine: 1 mg/dL (ref 0.6–1.0)
Glucose: 93 mg/dL (ref 70–100)
Potassium: 4.5 mEq/L (ref 3.5–5.1)
Sodium: 140 mEq/L (ref 136–145)

## 2012-06-29 LAB — CBC
Hematocrit: 40.8 % (ref 37.0–47.0)
Hgb: 13.2 g/dL (ref 12.0–16.0)
MCH: 32.3 pg — ABNORMAL HIGH (ref 28.0–32.0)
MCHC: 32.4 g/dL (ref 32.0–36.0)
MCV: 99.8 fL (ref 80.0–100.0)
MPV: 10.8 fL (ref 9.4–12.3)
Nucleated RBC: 0 /100 WBC (ref 0–1)
Platelets: 189 10*3/uL (ref 140–400)
RBC: 4.09 10*6/uL — ABNORMAL LOW (ref 4.20–5.40)
RDW: 14 % (ref 12–15)
WBC: 4.8 10*3/uL (ref 3.50–10.80)

## 2012-06-29 LAB — GFR: EGFR: 53.6

## 2012-06-29 MED ORDER — PROPOFOL INFUSION 10 MG/ML
INTRAVENOUS | Status: DC | PRN
Start: 2012-06-29 — End: 2012-06-29
  Administered 2012-06-29 (×4): 50 mg via INTRAVENOUS

## 2012-06-29 MED ORDER — SODIUM CHLORIDE 0.9 % IV SOLN
INTRAVENOUS | Status: DC | PRN
Start: 2012-06-29 — End: 2012-06-29

## 2012-06-29 NOTE — Progress Notes (Signed)
Dr. Denton Lank did TEE and she spoke with pt/husband post procedure. Pt tolerated procedure well. Volunteer to wheel pt downstairs to meet ride home.

## 2012-06-29 NOTE — Anesthesia Preprocedure Evaluation (Signed)
Anesthesia Evaluation    AIRWAY    Mallampati: II    TM distance: >3 FB  Neck ROM: full  Mouth Opening:full   CARDIOVASCULAR    irregular and normal     DENTAL    No notable dental hx     PULMONARY    pulmonary exam normal and clear to auscultation     OTHER FINDINGS                  Anesthesia Plan    ASA 3   general   Detailed anesthesia plan: general IV      Post op pain management: per surgeon        intravenous induction     informed consent obtained    Plan discussed with resident.

## 2012-06-29 NOTE — Discharge Instructions (Signed)
Anesthesia: General Anesthesia  You're due to have surgery. During surgery, you'll be given medication called anesthesia. (It is also called anesthetic.) This will keep you comfortable and pain-free. Youranesthesia providerwill use general anesthesia. This sheet tells you more about it.    What Is General Anesthesia?  General anesthesia puts you into a state like deep sleep. It goes into the bloodstream (IV anesthetics), into the lungs (gas anesthetics), or both. You feel nothing during the procedure. You will not remember it. During the procedure, the anesthesia provider monitors you continuously. He or she checks your heart rate and rhythm, blood pressure, breathing, and blood oxygen.   IV Anesthetics  IV anesthetics are given through an IV line in your arm. They're often given first. This is so you are asleep before a gas anesthetic is started. Some kinds of IV anesthetics relieve pain. Others relax you. Your doctor will decide which kind is best in your case.   Gas Anesthetics  Gas anesthetics are breathed into the lungs. They are often used to keep you asleep. They can be given through a facemask or a tube placed in your larynx or trachea (breathing tube).   If you have a facemask, your anesthesia provider will most likely place it over your nose and mouth while you're still awake. You'll breathe oxygen through the mask as your IV anesthetic is started. Gas anesthetic may be added through the mask.   If you have atube in the larynx or trachea,it will be inserted into your throat after you're asleep.  Anesthesia Tools and Medications  You will likely have:   IV anesthetics put into an IV line into your bloodstream.   Gas anestheticsbreathed into your lungs, where they pass into your bloodstream.   A pulse oximeter attached tothe end of your finger. This measures your blood oxygen level.   Electrocardiography leads (electrodes)are small sticky pads on your chest. These record your heart rate and  rhythm.   A blood pressure cuff. This reads your blood pressure.  Risks and Possible Complications  General anesthesia has some risks. These include:   Breathing problems   Nausea and vomiting   Sore throat or hoarseness (usually temporary)   Allergic reaction to the anesthetic   Irregular heartbeat (rare)   Cardiac arrest (rare)   Anesthesia Safety   Follow all instructions you are given for how long not to eat or drink before your procedure.   Be sure your doctor knows what medicationsand drugsyou take. This includes over-the-counter medications, herbs, supplements, alcohol or other drugs. You will be asked when those were last taken.   Have an adult family member or friend drive you home after the procedure.   For the first 24 hours after your surgery:   Do not drive or use heavy equipment.   Have a trusted family member or spouse make important decisions or sign documents.   Avoid alcohol.   Have someone stay with you, if possible. They can watch for problems and help keep you safe.   2000-2013 [employer], [address], [place] [address]. All rights reserved. This information is not intended as a substitute for professional medical care. Always follow your healthcare professional's instructions.    Transesophageal Echo    You have had a Transesophageal Echocardiogram (TEE).    An echocardiogram, also known as an "echo," is a diagnostic test using ultrasound waves to produce a moving picture of your heart. This particular test uses an ultrasound device on an endoscope (  a flexible probe) that is inserted into your mouth and then your esophagus (food pipe). Throat numbing medications and mild sedation are used for this procedure.    Because the esophagus is located just behind your heart, clear pictures of your heart and its various parts (valves and chambers) can be obtained. These images are produced on a computer screen for your doctor to look at. Blood flow through your heart  valves can also be seen and evaluated.    The TEE is usually done if your doctor suspects a problem with your heart muscle, heart valves, and / or the chambers of your heart. It is also used to check on the effectiveness of any medical or surgical treatments you have had for your heart.    TEE is a very good test for finding blood clots and tumors inside of the heart. It also can show problems of the heart valves, such as infection or defects of the valve parts. It can also help your doctor to detect abnormal holes in the heart wall that arise from certain congenital heart diseases (defects present from birth). Finally, it is used to diagnose and determine the severity of tears in the wall of the aorta, called an aortic dissection.    A cardiologist (heart doctor) has reviewed your echocardiogram. At this time, your doctor has determined that further testing of your heart IS NOT necessary.    YOU SHOULD SEEK MEDICAL ATTENTION IMMEDIATELY, EITHER HERE OR AT THE NEAREST EMERGENCY DEPARTMENT, IF ANY OF THE FOLLOWING OCCURS:   The symptoms that lead to you getting the TEE return or become worse.   You have any additional concerns.

## 2012-06-29 NOTE — Sedation Documentation (Signed)
Positioned pt for comfort, Low lights and knees bent.

## 2012-06-29 NOTE — Transfer of Care (Cosign Needed)
Anesthesia Transfer of Care Note    Patient: [name]    Procedures performed: * No procedures listed *    Anesthesia type: General TIVA    Patient location:Outpatient    Last vitals:   Filed Vitals:    06/29/12 1450   BP: 127/77   Pulse: 101   Resp: 16   SpO2: 98%       Post pain: Patient not complaining of pain, continue current therapy     Mental Status:awake    Respiratory Function: tolerating nasal cannula    Cardiovascular: stable    Nausea/Vomiting: patient not complaining of nausea or vomiting    Hydration Status: adequate    Post assessment: no apparent anesthetic complications

## 2012-07-04 NOTE — Anesthesia Postprocedure Evaluation (Signed)
Anesthesia Post Procedure Evaluation    Patient: [name]    Procedures performed: * No procedures listed *    Anesthesia type: General TIVA    Patient location: Outpatient      Last vitals: There were no vitals filed for this visit.    Post vital signs: stable    Other complications: none    Patient discharged prior to post-anesthetic evaluation. Records reviewed. No apparent anesthetic complications.

## 2012-09-01 ENCOUNTER — Ambulatory Visit: Payer: Medicare Other | Attending: Cardiology

## 2012-09-01 DIAGNOSIS — I4891 Unspecified atrial fibrillation: Secondary | ICD-10-CM | POA: Insufficient documentation

## 2012-09-01 DIAGNOSIS — Z01818 Encounter for other preprocedural examination: Secondary | ICD-10-CM | POA: Insufficient documentation

## 2012-09-01 DIAGNOSIS — I059 Rheumatic mitral valve disease, unspecified: Secondary | ICD-10-CM | POA: Insufficient documentation

## 2012-09-01 LAB — BASIC METABOLIC PANEL
BUN: 17 mg/dL (ref 6.0–20.0)
CO2: 27 mEq/L (ref 21–30)
Calcium: 9.4 mg/dL (ref 7.9–10.6)
Chloride: 106 mEq/L (ref 96–109)
Creatinine: 0.8 mg/dL (ref 0.4–1.5)
Glucose: 76 mg/dL (ref 70–100)
Potassium: 4.7 mEq/L (ref 3.5–5.3)
Sodium: 142 mEq/L (ref 135–146)

## 2012-09-01 LAB — PT/INR
PT INR: 2.5 — ABNORMAL HIGH (ref 0.9–1.1)
PT: 26.7 s — ABNORMAL HIGH (ref 12.6–15.0)

## 2012-09-01 LAB — CBC
Hematocrit: 40.1 % (ref 37.0–47.0)
Hgb: 12.8 g/dL (ref 12.0–16.0)
MCH: 32.7 pg — ABNORMAL HIGH (ref 28.0–32.0)
MCHC: 31.9 g/dL — ABNORMAL LOW (ref 32.0–36.0)
MCV: 102.6 fL — ABNORMAL HIGH (ref 80.0–100.0)
MPV: 11 fL (ref 9.4–12.3)
Nucleated RBC: 0 /100 WBC (ref 0–1)
Platelets: 181 10*3/uL (ref 140–400)
RBC: 3.91 10*6/uL — ABNORMAL LOW (ref 4.20–5.40)
RDW: 14 % (ref 12–15)
WBC: 4.08 10*3/uL (ref 3.50–10.80)

## 2012-09-01 LAB — GFR: EGFR: >60

## 2012-09-01 LAB — HEMOLYSIS INDEX: Hemolysis Index: 14 Index — ABNORMAL HIGH (ref 0–9)

## 2012-09-08 ENCOUNTER — Ambulatory Visit: Payer: Medicare Other | Admitting: Cardiovascular Disease

## 2012-09-08 ENCOUNTER — Ambulatory Visit
Admission: RE | Admit: 2012-09-08 | Discharge: 2012-09-08 | Disposition: A | Discharge status: Home / Self Care | Payer: Medicare Other | Source: Ambulatory Visit | Attending: Cardiovascular Disease | Admitting: Cardiovascular Disease

## 2012-09-08 ENCOUNTER — Encounter
Admission: RE | Disposition: A | Discharge status: Home / Self Care | Payer: Self-pay | Source: Ambulatory Visit | Attending: Cardiovascular Disease

## 2012-09-08 DIAGNOSIS — E785 Hyperlipidemia, unspecified: Secondary | ICD-10-CM | POA: Insufficient documentation

## 2012-09-08 DIAGNOSIS — I2789 Other specified pulmonary heart diseases: Secondary | ICD-10-CM | POA: Insufficient documentation

## 2012-09-08 DIAGNOSIS — I519 Heart disease, unspecified: Secondary | ICD-10-CM | POA: Insufficient documentation

## 2012-09-08 DIAGNOSIS — E039 Hypothyroidism, unspecified: Secondary | ICD-10-CM | POA: Insufficient documentation

## 2012-09-08 DIAGNOSIS — I059 Rheumatic mitral valve disease, unspecified: Secondary | ICD-10-CM | POA: Insufficient documentation

## 2012-09-08 LAB — PT/INR
PT INR: 1.3 — ABNORMAL HIGH (ref 0.9–1.1)
PT: 15.9 s — ABNORMAL HIGH (ref 12.6–15.0)

## 2012-09-08 SURGERY — RIGHT & LEFT HEART CATH POSSIBLE PCI
Anesthesia: Conscious Sedation

## 2012-09-08 MED ORDER — LIDOCAINE HCL (PF) 1 % IJ SOLN
INTRAMUSCULAR | Status: AC
Start: 2012-09-08 — End: 2012-09-08
  Filled 2012-09-08: qty 1

## 2012-09-08 MED ORDER — HEPARIN SODIUM (PORCINE) 1000 UNIT/ML IJ SOLN
INTRAMUSCULAR | Status: AC
Start: 2012-09-08 — End: 2012-09-08
  Administered 2012-09-08: 2000 [IU] via INTRAVENOUS
  Filled 2012-09-08: qty 1

## 2012-09-08 MED ORDER — SODIUM CHLORIDE 0.9 % IV SOLN
INTRAVENOUS | Status: AC
Start: 2012-09-08 — End: 2012-09-08

## 2012-09-08 MED ORDER — IODIXANOL 320 MG/ML IV SOLN
80.0000 mL | Freq: Once | INTRAVENOUS | Status: AC | PRN
Start: 2012-09-08 — End: 2012-09-08
  Administered 2012-09-08: 80 mL via INTRA_ARTERIAL

## 2012-09-08 MED ORDER — VERAPAMIL HCL 2.5 MG/ML IV SOLN
INTRAVENOUS | Status: AC
Start: 2012-09-08 — End: 2012-09-08
  Administered 2012-09-08: 2.5 mg via INTRA_ARTERIAL
  Filled 2012-09-08: qty 2

## 2012-09-08 MED ORDER — SODIUM CHLORIDE 0.9 % IV SOLN
INTRAVENOUS | Status: DC
Start: 2012-09-08 — End: 2012-09-08

## 2012-09-08 MED ORDER — NITROGLYCERIN IN D5W 200-5 MCG/ML-% IV SOLN
INTRAVENOUS | Status: AC
Start: 2012-09-08 — End: 2012-09-08
  Administered 2012-09-08: 200 ug via INTRA_ARTERIAL
  Filled 2012-09-08: qty 5

## 2012-09-08 MED ORDER — MIDAZOLAM HCL 2 MG/2ML IJ SOLN
INTRAMUSCULAR | Status: AC
Start: 2012-09-08 — End: 2012-09-08
  Administered 2012-09-08: 2 mg via INTRAVENOUS
  Filled 2012-09-08: qty 2

## 2012-09-08 MED ORDER — HEPARIN WASH BOWL 5 UNITS/ML SOLN (CATH LAB)
Status: AC
Start: 2012-09-08 — End: 2012-09-08
  Filled 2012-09-08: qty 2

## 2012-09-08 MED ORDER — ACETAMINOPHEN 500 MG PO TABS
500.0000 mg | ORAL_TABLET | Freq: Once | ORAL | Status: AC
Start: 2012-09-08 — End: 2012-09-08
  Administered 2012-09-08: 500 mg via ORAL
  Filled 2012-09-08: qty 1

## 2012-09-08 MED ORDER — FENTANYL CITRATE 0.05 MG/ML IJ SOLN
INTRAMUSCULAR | Status: AC
Start: 2012-09-08 — End: 2012-09-08
  Administered 2012-09-08: 50 ug via INTRAVENOUS
  Filled 2012-09-08: qty 2

## 2012-09-08 MED ORDER — FUROSEMIDE 20 MG PO TABS
20.00 mg | ORAL_TABLET | Freq: Two times a day (BID) | ORAL | Status: DC
Start: 2012-09-08 — End: 2013-05-29

## 2012-09-08 NOTE — Progress Notes (Signed)
Pt. Ambulated w/o difficulty, right wrist site and right groin sites c/d/i, no hematomas.   Pt. Denies pain and discomfort.   Aldrete 10.  Pt. Verbalized understanding of all discharge instructions.  IV and monitor discontinued, and pt. Escorted to vehicle in wheelchair.

## 2012-09-08 NOTE — Plan of Care (Signed)
Pre- Cath Teaching and Learning Objectives   Learner: [name],   Preference for learning: Verbal  Teaching Method: Verbal Instruction   Outcome of Learning: Fully Achieved    Described/Demonstrated the following:     + Responsibilities of patient's care  + Cardiac Cath/PTCA/Stent/Brachytherapy   + Purpose of procedure  + Need to be NPO pre-procedure  + Need for maintaining bedrest & straight leg post-procedure & sheath removal.  + Necessary fluid intake after procedure  + Symptoms of bleeding & states plan to notify nurse.

## 2012-09-08 NOTE — Progress Notes (Signed)
vasband removed w/o difficulty.  Pt. Ambulated to bathroom and voided.   Aldrete 10.   Right groin site c/d/i, no hematoma, right radial site with nickel-sized bruising, no hematoma.   Pt. And husband verbalized understanding of all discharge instructions.

## 2012-09-08 NOTE — Progress Notes (Signed)
ASA      Indication(s):   ( ) Failed stress test  ( ) Chest pain / Angina  ( ) Abnormal EKG  ( ) Known CAD  ( ) Heart Failure / post Heart Transplant  (x ) Other:Severe mitral valve regurgitation    Review of systems performed:   Yes  (x)         Allergies:     Allergies   Allergen Reactions   . Erythromycin          Most recent labs:    If available in Epic:     Lab Results   Component Value Date    WBC 4.08 09/01/2012    HGB 12.8 09/01/2012    HCT 40.1 09/01/2012    MCV 102.6* 09/01/2012    PLT 181 09/01/2012          Lab 09/01/12 1226   NA 142   K 4.7   CL 106   CO2 27   BUN 17.0   CREAT 0.8   EGFR >60.0   GLU 76   CA 9.4   ALB --   PHOS --       If unavailable in Epic, present in paper chart.    ASA Physical Status:   (  )  ASA 1  Healthy patient    (  )  ASA 2  Mild systemic illness    ( x )  ASA 3  Systemic disease, though not incapacitating    (  )  ASA 4  Severe systemic disease that is a constant threat to life     (  )  ASA 5  Moribund condition, patient unexpected to live >24 hours, irrespective of    procedure    (  )  E       Emergent procedure    Planned sedation:   (  ) No sedation  (x) Moderate sedation  (  ) Deep sedation (with Anesthesiology present)      Conclusion:   Indications, benefits and risks including bleeding, vascular complications, contrast nephropathy, MI, CVA, urgent CABG and death) were explained to the patient and husband. Both agreeable to proceed.   This patient has been seen and examined immediately prior to the procedure, and I feel that they are an appropriate candidate for the planned procedure with the planned sedation.    The risks, benefits, and alternatives to the planned procedure and sedation have been explained to the patient or the patient's guardian.     The currently available history & physical has been reviewed, and there are no major changes.     Exceptions only in the case of emergency.         Rush Landmark, MD

## 2012-09-08 NOTE — Progress Notes (Signed)
Received patient from ccl staff.   Pt. C/o only of slight headache.   Right radial band in place with good distal pulses, and right groin site c/d/i, with good distal pulses.    Pt. Tolerating diet with husband at bedside.

## 2012-09-08 NOTE — H&P (Addendum)
[place] HEART CARDIOLOGY CONSULTATION REPORT  [facility]    Date Time: 09/08/2012 7:32 AM  Patient Name: TeresaTeresa Young  Requesting Physician: Rush Landmark, MD           History:   Mrs. Swamy is a pleasant 79 year old female, returning for clinical followup after her recent transesophageal echocardiogram. 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 The left atrium was markedly dilated and there was no thrombus. The patient also had moderate to severe tricuspid regurgitation with marked right atrial dilatation. There was no pulmonary vein reversal and cord and leaflet structure was intact. Left ventricular size and function was normal. She continues to be limited in her level of exercise, primarily by lower spine and foot issues. We discussed stress echocardiography as a means to further evaluate whether she should have surgical management of her mitral valvular disease. She has never been on a treadmill and is going to practice. She denies PND, orthopnea or pedal edema. She has chronic dyspnea on exertion, which is multifactorial.   Stress echo showed severe pulm HTN with exercise and only 2. were achieved.     Past Medical History:     Past Medical History   Diagnosis Date   . Arrhythmia    . Shortness of breath    . Benign heart murmur    . AVD (aortic valve disease)    . Celiac disease    . Scoliosis    . Hyperlipidemia    . Thyroid disease    . Hypothyroidism    . GERD (gastroesophageal reflux disease)      occasional , asymptomatic       Past Surgical History:     Past Surgical History   Procedure Date   . Appendectomy    . Knee surgery    . Tonsillectomy and adenoidectomy        Family History:   History reviewed. No pertinent family history.    Social History:     History     Social History   . Marital Status: Married     Spouse Name: N/A     Number of Children: N/A   . Years of Education: N/A     Social History Main Topics    . Smoking status: Never Smoker    . Smokeless tobacco: Never Used   . Alcohol Use: No      Comment: rare   . Drug Use: No   . Sexually Active:      Other Topics Concern   . Not on file     Social History Narrative   . No narrative on file       Allergies:     Allergies   Allergen Reactions   . Erythromycin        Medications:     Prescriptions prior to admission   Medication Sig   . acetaminophen (TYLENOL) 500 MG tablet Take 500 mg by mouth 3 (three) times daily.   Marland Kitchen alendronate (FOSAMAX) 70 MG tablet Take 70 mg by mouth every 7 days. Take 1 tab by mouth every 7 days with a full glass of water on an empty stomach. Do not take anything else by mouth or lie down for 30 mins.   . bisoprolol (ZEBETA) 5 MG tablet Take 5 mg by mouth daily.     . Calcium Carbonate-Vit D-Min (CALCIUM 600 + MINERALS) 600-200 MG-UNIT TABS Take by mouth. Take as directed    .  Coenzyme Q10 (COQ10) 100 MG CAPS Take 1 tablet by mouth daily.   Marland Kitchen gabapentin (NEURONTIN) 100 MG capsule Take 100 mg by mouth 3 (three) times daily.     . Iron-Vitamin C (VITRON-C PO) Take 1 tablet by mouth twice a week.   . levothyroxine (SYNTHROID, LEVOTHROID) 112 MCG tablet Take 112 mcg by mouth daily.     Marland Kitchen lisinopril (PRINIVIL,ZESTRIL) 5 MG tablet Take 5 mg by mouth daily.     Marland Kitchen loratadine (CLARITIN) 10 MG tablet Take 10 mg by mouth daily.     . Magnesium 250 MG TABS Take 1 tablet by mouth daily.   . Multiple Vitamin (MULTI-VITAMINS PO) Take 1 tablet by mouth.   . Probiotic Product (ALIGN PO) Take 1 tablet by mouth daily.   . rivaroxaban (XARELTO) 15 MG TABS Take 15 mg by mouth daily.     . simvastatin (ZOCOR) 40 MG tablet Take 40 mg by mouth nightly.     . Vitamins C E (CRANBERRY CONCENTRATE PO) Take 500 mg by mouth daily.   Marland Kitchen warfarin (COUMADIN) 5 MG tablet Take 5 mg by mouth daily.      Current Facility-Administered Medications   Medication Dose Route Frequency         Review of Systems:    Comprehensive review of systems including constitutional, eyes, ears,  nose, mouth, throat, cardiovascular, GI, GU, musculoskeletal, integumentary, respiratory, neurologic, psychiatric, and endocrine is negative other than what is mentioned already in the history of present illness    Physical Exam:     Filed Vitals:    09/08/12 0702   BP: 153/51   Pulse: 73   Temp: 96 F (35.6 C)   Resp: 18   SpO2: 97%     Temp (24hrs), Avg:96 F (35.6 C), Min:96 F (35.6 C), Max:96 F (35.6 C)      Intake and Output Summary (Last 24 hours) at Date Time  No intake or output data in the 24 hours ending 09/08/12 0732    GENERAL: Patient is in no acute distress   HEENT: No scleral icterus or conjunctival pallor, moist mucous membranes   NECK: Elevated jugular venous distention or thyromegaly, normal carotid upstrokes without bruits   CARDIAC:  S1 and S2, 2/6 SEJM at RUSB  CHEST: Clear to auscultation bilaterally, normal respiratory effort  ABDOMEN: soft  SKIN: No rash or jaundice   NEUROLOGIC: Alert and oriented to time, place and person, normal mood and affect   MUSCULOSKELETAL: Normal muscle strength and tone.      Labs Reviewed:   No results found for this basename: CK:3,TROPI:3,TROPT:3,CKMBINDEX:3 in the last 168 hours  No results found for this basename: DIG in the last 168 hours  No results found for this basename: CHOL:3,TRIG:3,HDL:3,LDL:3 in the last 168 hours  No results found for this basename: BILITOTAL,BILIDIRECT,PROT,ALB,ALT,AST in the last 168 hours  No results found for this basename: MG in the last 168 hours    Lab 09/08/12 0645   PT 15.9*   INR 1.3*   PTT --       Lab 09/01/12 1226   WBC 4.08   HGB 12.8   HCT 40.1   PLT 181       Lab 09/01/12 1226   NA 142   K 4.7   CL 106   CO2 27   BUN 17.0   CREAT 0.8   EGFR >60.0   GLU 76   CA 9.4     STRESS ECHO  Normal  stress echocardiogram without clinical, EKG, or echocardiographic evidence of ischemia.  Severe mitral regurgitation with associated pulmonary hypertension, worse with exercise and symptomatic.    Results were discussed with the  patient and her husband.  At this point, she does have evidence that surgical management of her valvular disease is indicated if she wishes to proceed.  We reviewed her STS score and the potential comorbidities.  She is going to return for coumadin teaching next week and let us know if she wants to proceed with cardiac catheterization as a prelude to mitral annuloplasty and MAZE procedures.    IMPRESSION:    1. Moderate to severe mitral regurgitation with biatrial enlargement and mild pulmonary    hypertension by echocardiogram June 06, 2012 and transesophageal    echocardiography demonstrating the etiology of MR as being annular dilatation.  2. Recurrent atrial fibrillation with history of transesophageal echocardiography guided    cardioversion February 2013.  3. EP consult with Dr. Lorel Monaco June 06, 2012 recommending further evaluation of    underlying valvular disease, rate control strategy or cardioversion after amiodarone    loading.   4. Right sided neck fullness with subsequent negative CT of the neck in 2008.  5. Normal CT of the abdomen and pelvis in 2009 with history of celiac disease.   6. Anemia, followed by Dr. Pecola Leisure with normal blood counts June 29, 2012.  7. Normal perfusion by adenosine MPI 2007 and Lexiscan in 2013.      RECOMMENDATIONS:  1. The patient is going to try practicing walking on a treadmill. She is limited by scoliosis    and spinal stenosis. She is going to return for a stress echo, probably on a [place]   . protocol to assess the functional impairment that she is experiencing based on her    mitral valvular disease and atrial fibrillation.  2. Following that, we discussed possible surgical evaluation, although if she is more    limited by musculoskeletal issues, clinical observation of her cardiac situation may    also be reasonable.  3. We further briefly discussed amiodarone loading and electrical cardioversion, but this    may not be a complete solution, given the presence  of underlying moderately severe    mitral regurgitation and biatrial enlargement.       Signed by: Rush Landmark, MD    [facility]  NP Spectralink [phone] (8am-5pm)  MD Spectralink [phone] or [phone] (8am-5pm)  Arrhythmia Spectralink [phone] (8am-4:30pm)  After hours, non urgent consult line [phone]  After Hours, urgent consults [phone]

## 2012-09-08 NOTE — Discharge Instructions (Signed)
[facility]  [address]  [place], [place]    Interventional Cardiovascular Admission and Recovery  Post Catheterization Discharge Instructions  Arm Access          Access Site: Radial or Brachial Artery    Activity:  1. Do not lift anything greater than five (5) pounds in weight and no strenuous activity for 48 hours.  2. No driving for 48 hours following your procedure.    Access Site Care:  1. You may shower 24 hours after your procedure.  Leave the bandage in place and just let the water passively flow over the site.  After 48 hours REMOVE the dressing before or during your shower.  Again, let the water passively flow over the site, wash gently with your hand, then pat the area dry.    2. Do not rub, pick or scratch the area.   3. Do not apply creams, powders, lotions, or ointments to the site.   4. Apply a regular sized Band-Aid to the puncture site and change it daily for five (5) days.  You may shower daily.  5. If the puncture site looks like it is becoming infected or not healing properly, (red, hot to touch, drainage, and/or fever over 101 degrees F), call the doctor who performed the procedure.    Normal Observation:  1. You may feel tenderness.      2. You may experience some mild bruising.      Call 911 if:  1. You are experiencing unrelieved chest pain.  2. You notice bleeding either through the dressing or underneath the skin.  If the blood is trapped under the skin, the area will hurt, become swollen and hard.  If either happens, lay down flat and hold pressure on the site.  This is an arterial bleed, and may become an emergency if unattended.    3. Your arm becomes cold, numb, painful, grayish in color, or change from usual color/sensation.                                                                      [facility]                                                                    [address]  [place], [place]                                                  Interventional Cardiovascular Admission and Recovery                                                                   Post Catheterization Discharge Instructions                                                                                   Groin Access    Closure Device: ___________________________         Access Site:______________________    Activity:  1. No driving for 24 hours following the procedure due to medications you may have received.   2. Rest today and tomorrow, gradually increasing to your usual activities.  3. Limit stair usage for the next 24 hours. If you must use the stairs, take them one at a time, leading with your unaffected leg holding pressure on the bandaged site.  4. Do not lift anything over 10 pounds in weight for five (5) days. That includes pushing, pulling, dragging or moving anything.  5. No strenuous activity for five (5) days. Do not attempt anything that may cause fatigue, shortness of breath, perspiration or chest pain.   6. Support the bandaged site when coughing or sneezing. Do not strain when having a bowel movement.     Access Site Care:  1. No tub baths, hot tubs, pools or sitting in water for one week.  2. You may shower 24 hours after your procedure. Leave the bandage in place and just let the water passively flow over the site.  3. REMOVE the dressing 48 hours after your procedure, either before or during your shower. Again, let the water passively flow over the site, wash gently with your hand, then pat the area dry.   4. Do not rub, pick or scratch the area.   5. Do not apply creams, powders, lotions, or ointments to the site.   6. Apply a regular sized Band-Aid to the puncture site and change it daily for five (5) days. You may shower daily.  7. If the puncture site looks like it is becoming infected or not healing properly, (red, hot to touch, drainage, and/or fever over 101 degrees F), call the doctor who performed the procedure.     Normal  Observation:  1. You may feel tenderness.   2. You may experience some mild bruising.   3. You may feel a small lump, about the size of an olive pit, which should disappear within 90 days if a closure device is used.   Call 911 if:  1. You are experiencing unrelieved chest pain.  2. You notice bleeding either through the dressing or underneath the skin. If the blood is trapped under the skin, the area will hurt, become swollen and hard. If either happens, lay down flat and hold pressure on the site. This is an arterial bleed, and may become an emergency if unattended.   3. Your leg becomes cold, numb, painful, grayish in color, or change from usual color/sensation.

## 2012-09-15 ENCOUNTER — Ambulatory Visit: Payer: Medicare Other | Attending: Cardiovascular Disease

## 2012-09-15 DIAGNOSIS — R0602 Shortness of breath: Secondary | ICD-10-CM | POA: Insufficient documentation

## 2012-09-15 DIAGNOSIS — O46012 Antepartum hemorrhage with afibrinogenemia, second trimester: Secondary | ICD-10-CM

## 2012-09-15 DIAGNOSIS — I4891 Unspecified atrial fibrillation: Secondary | ICD-10-CM | POA: Insufficient documentation

## 2012-09-15 LAB — HEMOLYSIS INDEX: Hemolysis Index: 51 Index — ABNORMAL HIGH (ref 0–9)

## 2012-09-15 LAB — BASIC METABOLIC PANEL
BUN: 23 mg/dL — ABNORMAL HIGH (ref 6.0–20.0)
CO2: 26 mEq/L (ref 21–30)
Calcium: 9.3 mg/dL (ref 7.9–10.6)
Chloride: 105 mEq/L (ref 96–109)
Creatinine: 0.9 mg/dL (ref 0.4–1.5)
Glucose: 84 mg/dL (ref 70–100)
Potassium: 5.3 mEq/L (ref 3.5–5.3)
Sodium: 141 mEq/L (ref 135–146)

## 2012-09-15 LAB — GFR: EGFR: >60

## 2013-04-19 ENCOUNTER — Other Ambulatory Visit: Payer: Medicare Other

## 2013-04-26 ENCOUNTER — Other Ambulatory Visit: Payer: Medicare Other

## 2013-04-28 ENCOUNTER — Ambulatory Visit
Admission: RE | Admit: 2013-04-28 | Discharge: 2013-04-28 | Disposition: A | Discharge status: Home / Self Care | Payer: Medicare Other | Source: Ambulatory Visit | Attending: Critical Care Medicine | Admitting: Critical Care Medicine

## 2013-04-28 VITALS — BP 140/80 | HR 98 | Resp 16 | Ht 64.0 in | Wt 151.8 lb

## 2013-04-28 DIAGNOSIS — J449 Chronic obstructive pulmonary disease, unspecified: Secondary | ICD-10-CM

## 2013-04-28 DIAGNOSIS — J4489 Other specified chronic obstructive pulmonary disease: Secondary | ICD-10-CM | POA: Insufficient documentation

## 2013-04-28 HISTORY — DX: Encounter for other orthopedic aftercare: Z47.89

## 2013-04-28 HISTORY — DX: Anemia, unspecified: D64.9

## 2013-04-28 HISTORY — DX: Other forms of dyspnea: R06.09

## 2013-04-28 HISTORY — DX: Chronic obstructive pulmonary disease, unspecified: J44.9

## 2013-04-28 HISTORY — DX: Other disorders of lung: J98.4

## 2013-04-28 NOTE — Progress Notes (Signed)
Pulmonary Rehabilitation Assessment and Six Minute Walk   Initial Assessment  04/28/2013   Start Time: 0900   End Time:  1115   Total Time:  135 mins  Supervising MD readily available:  Dr. Illene Bolus    Initial Evaluation completed by Aleen Sells EP    Referral Diagnosis:  COPD  [name] understands she has COPD, mild to medium  No chief complaint on file.    Duration of onset:  Apr 04, 2013 diagnosed, SOB for 2 years  [facility]  [address]  [place], [place] [address]  Danford Bad, MD  Allergies   Allergen Reactions   . Erythromycin      Current Outpatient Rx   Name  Route  Sig  Dispense  Refill   . ACETAMINOPHEN 500 MG PO TABS    Oral    Take 500 mg by mouth 3 (three) times daily.             . ALENDRONATE SODIUM 70 MG PO TABS    Oral    Take 70 mg by mouth every 7 days. Take 1 tab by mouth every 7 days with a full glass of water on an empty stomach. Do not take anything else by mouth or lie down for 30 mins.             Marland Kitchen BISOPROLOL FUMARATE 5 MG PO TABS    Oral    Take 5 mg by mouth daily.               Marland Kitchen CALCIUM 600 + MINERALS 600-200 MG-UNIT PO TABS    Oral    Take by mouth. Take as directed              . COQ10 100 MG PO CAPS    Oral    Take 1 tablet by mouth daily.             . FUROSEMIDE 20 MG PO TABS    Oral    Take 1 tablet (20 mg total) by mouth 2 (two) times daily.    30 tablet    0     . GABAPENTIN 100 MG PO CAPS    Oral    Take 100 mg by mouth 3 (three) times daily.               Marland Kitchen VITRON-C PO    Oral    Take 1 tablet by mouth twice a week.             Marland Kitchen LEVOTHYROXINE SODIUM 112 MCG PO TABS    Oral    Take 112 mcg by mouth daily.               Marland Kitchen LISINOPRIL 5 MG PO TABS    Oral    Take 5 mg by mouth daily.               Marland Kitchen LORATADINE 10 MG PO TABS    Oral    Take 10 mg by mouth daily.               Marland Kitchen MAGNESIUM 250 MG PO TABS    Oral    Take 1 tablet by mouth daily.             . MULTI-VITAMINS PO    Oral    Take 1 tablet by mouth.             Marland Kitchen  ALIGN PO    Oral     Take 1 tablet by mouth daily.             Marland Kitchen SIMVASTATIN 40 MG PO TABS    Oral    Take 40 mg by mouth nightly.               Marland Kitchen CRANBERRY CONCENTRATE PO    Oral    Take 500 mg by mouth daily.             . WARFARIN SODIUM 5 MG PO TABS    Oral    Take 5 mg by mouth daily.               Past Medical History   Diagnosis Date   . Arrhythmia    . Shortness of breath    . Benign heart murmur    . AVD (aortic valve disease)    . Celiac disease    . Scoliosis    . Hyperlipidemia    . Thyroid disease    . Hypothyroidism    . GERD (gastroesophageal reflux disease)      occasional , asymptomatic     Past Surgical History   Procedure Date   . Appendectomy    . Knee surgery    . Tonsillectomy and adenoidectomy      History   Substance Use Topics   . Smoking status: Never Smoker    . Smokeless tobacco: Never Used   . Alcohol Use: No      Comment: rare       There were no vitals filed for this visit.  There is no height or weight on file to calculate BMI.   Symptoms:   Cough:  Only when hoariness is severe enough  Sputum:  does not produce nor cough enough to have sputum  Wheeze:  no  Dyspnea:  climbing 1 flight of stairs, walking 1 block and vacuuming   Fluid retention:  mild pre-tibial edema  Clubbing:  no  Cyanosis:  no      Sleeping problems:  yes  Number of hours sleep per night:  8   Extra pillows:  no    Breathing Retraining needed:    Patient uses accessory muscles:  yes  Pursed Lip Breathing Training:  yes  Diaphragm Breathing Training:  yes    MMRC Dyspnea Index:      Borg Dyspnea Scale:       Occupation:  Currently working:  no, retirement/disability date: 1992  Occupational exposures:  none    Current functional activity level:  Sedentary, not able to go walking as she would like; but is active with normal activities such as shopping, errands, going out, etc  Has a DMV Placard:  no    Difficulty with ADLs:  Walking, climbing stairs, vacuuming  Stairs at home:  yes - 12 and difficulty with stairs       Variables Affecting  Learning:    Vision:  yes  Glasses:  yes  Hearing aid:  no  Language:  no  Ethnic/cultural diversity:  no    Vaccines:  Flu - yes, Date: 04/04/13 and Pneumonia - yes, Date: 07/04/08; shingles-about 2010    Respiratory Infections past year:  0  Hospitalizations/ED past year:  no  Have you attending Pulmonary Rehab before?  no  Need training in warning signal, infection prevention:  yes    Stress Management  Stressors:  Back problems  Relaxation techniques:  Gardening, reading,  book club, car rides    Aerosol Therapy:  Nebulizer:  no    Sleep Study:  no     Epworth Sleepiness Scale:  7    BIPAP/CPAP:  not prescribed    Oxygen Therapy:  not prescribed    Rescue Inhaler:  no     Functional Goals:  Walk better, improve SOB, walk up flight of stairs without needing to rest    Pulmonary Functions Test Results:  Date:  04/05/13  FEV1:  59  FEV1/FVC:  62  DLCO:  46  TLC:  95       RV: 132         Chest X-ray:  Date:  04/04/13  Result:  A little cloudy on top, had CT scan which came back clear      Assessment: Pt is very pleasant woman who has been diagnosed with COPD 04/04/13. She has a history of heart complications; afib and stenosis of AV; and has been experiencing an increase in her SOB over the past two years. After several cardiac tests which could not explain the shortness of breath, the patient was referred to a pulmonologist. Although the patient continues to be active around the house and does still go out, she has extreme difficulty with stairs and walking long distances. She does also have scoliosis and stenosis in her spine, which also had an effect on her ability to walk long distances. The patient states that she does not have a cough. Occasionally she will cough when her hoariness is very bad. She does not understand why she becomes hoarse and has assumed it has to do with her pulmonary issue. Patient does not have any problems sleeping. Due to having celiac disease, she does have acid reflux occasionally. Pt  also states that she is starting to get arthritis. Pt is not that deconditioned due to still being as active as she can. She does not work-out and does not take walks like she would to due to SOB. But she has not let it effect her to the point of being completely sedentary. Her favorite past-time is to garden, which will begin to cause her pain in her back from bending over the whole time. Her back will cause some adjustments and attention needed when exercising.          Increased work of breathing  Deconditioned difficulty with ADLs  Difficulty with stair climbing  Patient needs education on disease process    Plan:  Follow through with MD Pulmonary Rehabilitation order  Teach breathing retraining  Teach energy conservation/pacing  Stair climbing instructions  Educate patient on disease process

## 2013-05-03 ENCOUNTER — Inpatient Hospital Stay
Admission: RE | Admit: 2013-05-03 | Discharge: 2013-05-03 | Disposition: A | Discharge status: Home / Self Care | Payer: Medicare Other | Source: Ambulatory Visit | Attending: Critical Care Medicine | Admitting: Critical Care Medicine

## 2013-05-03 DIAGNOSIS — J4489 Other specified chronic obstructive pulmonary disease: Secondary | ICD-10-CM | POA: Insufficient documentation

## 2013-05-05 ENCOUNTER — Inpatient Hospital Stay
Admission: RE | Admit: 2013-05-05 | Discharge: 2013-05-05 | Disposition: A | Discharge status: Home / Self Care | Payer: Medicare Other | Source: Ambulatory Visit

## 2013-05-05 DIAGNOSIS — J449 Chronic obstructive pulmonary disease, unspecified: Secondary | ICD-10-CM

## 2013-05-08 ENCOUNTER — Inpatient Hospital Stay
Admission: RE | Admit: 2013-05-08 | Discharge: 2013-05-08 | Disposition: A | Discharge status: Home / Self Care | Payer: Medicare Other | Source: Ambulatory Visit

## 2013-05-08 DIAGNOSIS — J449 Chronic obstructive pulmonary disease, unspecified: Secondary | ICD-10-CM

## 2013-05-10 ENCOUNTER — Inpatient Hospital Stay
Admission: RE | Admit: 2013-05-10 | Discharge: 2013-05-10 | Disposition: A | Discharge status: Home / Self Care | Payer: Medicare Other | Source: Ambulatory Visit

## 2013-05-12 ENCOUNTER — Inpatient Hospital Stay
Admission: RE | Admit: 2013-05-12 | Discharge: 2013-05-12 | Disposition: A | Discharge status: Home / Self Care | Payer: Medicare Other | Source: Ambulatory Visit

## 2013-05-12 DIAGNOSIS — J449 Chronic obstructive pulmonary disease, unspecified: Secondary | ICD-10-CM

## 2013-05-12 NOTE — Addendum Note (Signed)
Encounter addended by: Gaynelle Cage, RCP on: 05/12/2013  8:55 AM<BR>     Documentation filed: Visit Diagnoses, Charges VN, Inpatient Patient Education

## 2013-05-12 NOTE — Addendum Note (Signed)
Encounter addended by: Gaynelle Cage, RCP on: 05/12/2013  8:56 AM<BR>     Documentation filed: Visit Diagnoses, Charges VN

## 2013-05-12 NOTE — Addendum Note (Signed)
Encounter addended by: Rickey Barbara, RCP on: 05/12/2013  9:07 AM<BR>     Documentation filed: Visit Diagnoses, Charges VN

## 2013-05-15 ENCOUNTER — Inpatient Hospital Stay
Admission: RE | Admit: 2013-05-15 | Discharge: 2013-05-15 | Disposition: A | Discharge status: Home / Self Care | Payer: Medicare Other | Source: Ambulatory Visit

## 2013-05-17 ENCOUNTER — Inpatient Hospital Stay
Admission: RE | Admit: 2013-05-17 | Discharge: 2013-05-17 | Disposition: A | Discharge status: Home / Self Care | Payer: Medicare Other | Source: Ambulatory Visit

## 2013-05-17 DIAGNOSIS — J449 Chronic obstructive pulmonary disease, unspecified: Secondary | ICD-10-CM

## 2013-05-19 ENCOUNTER — Inpatient Hospital Stay: Admission: RE | Admit: 2013-05-19 | Payer: Medicare Other | Source: Ambulatory Visit

## 2013-05-22 ENCOUNTER — Inpatient Hospital Stay
Admission: RE | Admit: 2013-05-22 | Discharge: 2013-05-22 | Disposition: A | Discharge status: Home / Self Care | Payer: Medicare Other | Source: Ambulatory Visit | Attending: Critical Care Medicine | Admitting: Critical Care Medicine

## 2013-05-22 DIAGNOSIS — J449 Chronic obstructive pulmonary disease, unspecified: Secondary | ICD-10-CM

## 2013-05-22 DIAGNOSIS — J4489 Other specified chronic obstructive pulmonary disease: Secondary | ICD-10-CM | POA: Insufficient documentation

## 2013-05-22 NOTE — Addendum Note (Signed)
Encounter addended by: Orpah Cobb, RCP on: 05/22/2013  9:17 AM<BR>     Documentation filed: Visit Diagnoses

## 2013-05-22 NOTE — Addendum Note (Signed)
Encounter addended by: Orpah Cobb, RCP on: 05/22/2013  9:16 AM<BR>     Documentation filed: Visit Diagnoses

## 2013-05-24 ENCOUNTER — Inpatient Hospital Stay
Admission: RE | Admit: 2013-05-24 | Discharge: 2013-05-24 | Disposition: A | Discharge status: Home / Self Care | Payer: Medicare Other | Source: Ambulatory Visit

## 2013-05-24 DIAGNOSIS — J449 Chronic obstructive pulmonary disease, unspecified: Secondary | ICD-10-CM

## 2013-05-26 ENCOUNTER — Inpatient Hospital Stay
Admission: RE | Admit: 2013-05-26 | Discharge: 2013-05-26 | Disposition: A | Discharge status: Home / Self Care | Payer: Medicare Other | Source: Ambulatory Visit

## 2013-05-26 DIAGNOSIS — J449 Chronic obstructive pulmonary disease, unspecified: Secondary | ICD-10-CM

## 2013-05-29 ENCOUNTER — Inpatient Hospital Stay
Admission: RE | Admit: 2013-05-29 | Discharge: 2013-05-29 | Disposition: A | Discharge status: Home / Self Care | Payer: Medicare Other | Source: Ambulatory Visit

## 2013-05-29 DIAGNOSIS — J449 Chronic obstructive pulmonary disease, unspecified: Secondary | ICD-10-CM

## 2013-05-31 ENCOUNTER — Inpatient Hospital Stay
Admission: RE | Admit: 2013-05-31 | Discharge: 2013-05-31 | Disposition: A | Discharge status: Home / Self Care | Payer: Medicare Other | Source: Ambulatory Visit

## 2013-05-31 DIAGNOSIS — J449 Chronic obstructive pulmonary disease, unspecified: Secondary | ICD-10-CM

## 2013-06-02 ENCOUNTER — Inpatient Hospital Stay
Admission: RE | Admit: 2013-06-02 | Discharge: 2013-06-02 | Disposition: A | Discharge status: Home / Self Care | Payer: Medicare Other | Source: Ambulatory Visit

## 2013-06-05 ENCOUNTER — Inpatient Hospital Stay
Admission: RE | Admit: 2013-06-05 | Discharge: 2013-06-05 | Disposition: A | Discharge status: Home / Self Care | Payer: Medicare Other | Source: Ambulatory Visit

## 2013-06-05 DIAGNOSIS — J449 Chronic obstructive pulmonary disease, unspecified: Secondary | ICD-10-CM

## 2013-06-07 ENCOUNTER — Inpatient Hospital Stay
Admission: RE | Admit: 2013-06-07 | Discharge: 2013-06-07 | Disposition: A | Discharge status: Home / Self Care | Payer: Medicare Other | Source: Ambulatory Visit

## 2013-06-07 DIAGNOSIS — J449 Chronic obstructive pulmonary disease, unspecified: Secondary | ICD-10-CM

## 2013-06-09 ENCOUNTER — Inpatient Hospital Stay
Admission: RE | Admit: 2013-06-09 | Discharge: 2013-06-09 | Disposition: A | Discharge status: Home / Self Care | Payer: Medicare Other | Source: Ambulatory Visit

## 2013-06-12 ENCOUNTER — Inpatient Hospital Stay
Admission: RE | Admit: 2013-06-12 | Discharge: 2013-06-12 | Disposition: A | Discharge status: Home / Self Care | Payer: Medicare Other | Source: Ambulatory Visit

## 2013-06-12 DIAGNOSIS — J449 Chronic obstructive pulmonary disease, unspecified: Secondary | ICD-10-CM

## 2013-06-14 ENCOUNTER — Inpatient Hospital Stay
Admission: RE | Admit: 2013-06-14 | Discharge: 2013-06-14 | Disposition: A | Discharge status: Home / Self Care | Payer: Medicare Other | Source: Ambulatory Visit

## 2013-06-14 DIAGNOSIS — J449 Chronic obstructive pulmonary disease, unspecified: Secondary | ICD-10-CM

## 2013-06-19 ENCOUNTER — Inpatient Hospital Stay
Admission: RE | Admit: 2013-06-19 | Discharge: 2013-06-19 | Disposition: A | Discharge status: Home / Self Care | Payer: Medicare Other | Source: Ambulatory Visit

## 2013-06-19 VITALS — BP 122/78 | HR 72 | Resp 16 | Ht 64.02 in | Wt 153.6 lb

## 2013-06-19 DIAGNOSIS — J449 Chronic obstructive pulmonary disease, unspecified: Secondary | ICD-10-CM

## 2013-06-21 ENCOUNTER — Inpatient Hospital Stay
Admission: RE | Admit: 2013-06-21 | Discharge: 2013-06-21 | Disposition: A | Discharge status: Home / Self Care | Payer: Medicare Other | Source: Ambulatory Visit

## 2013-06-21 DIAGNOSIS — J449 Chronic obstructive pulmonary disease, unspecified: Secondary | ICD-10-CM

## 2013-06-23 ENCOUNTER — Inpatient Hospital Stay
Admission: RE | Admit: 2013-06-23 | Discharge: 2013-06-23 | Disposition: A | Discharge status: Home / Self Care | Payer: Medicare Other | Source: Ambulatory Visit | Attending: Critical Care Medicine | Admitting: Critical Care Medicine

## 2013-06-23 DIAGNOSIS — J449 Chronic obstructive pulmonary disease, unspecified: Secondary | ICD-10-CM

## 2013-06-23 DIAGNOSIS — J4489 Other specified chronic obstructive pulmonary disease: Secondary | ICD-10-CM | POA: Insufficient documentation

## 2013-06-26 ENCOUNTER — Inpatient Hospital Stay
Admission: RE | Admit: 2013-06-26 | Discharge: 2013-06-26 | Disposition: A | Discharge status: Home / Self Care | Payer: Medicare Other | Source: Ambulatory Visit

## 2013-06-26 DIAGNOSIS — J449 Chronic obstructive pulmonary disease, unspecified: Secondary | ICD-10-CM

## 2013-06-28 ENCOUNTER — Inpatient Hospital Stay
Admission: RE | Admit: 2013-06-28 | Discharge: 2013-06-28 | Disposition: A | Discharge status: Home / Self Care | Payer: Medicare Other | Source: Ambulatory Visit

## 2013-06-28 DIAGNOSIS — J449 Chronic obstructive pulmonary disease, unspecified: Secondary | ICD-10-CM

## 2013-07-26 ENCOUNTER — Other Ambulatory Visit: Payer: Self-pay | Admitting: Cardiology

## 2013-07-26 DIAGNOSIS — I059 Rheumatic mitral valve disease, unspecified: Secondary | ICD-10-CM

## 2013-07-28 ENCOUNTER — Ambulatory Visit
Admission: RE | Admit: 2013-07-28 | Discharge: 2013-07-28 | Disposition: A | Discharge status: Home / Self Care | Payer: Medicare Other | Source: Ambulatory Visit | Attending: Cardiology | Admitting: Cardiology

## 2013-07-28 DIAGNOSIS — I079 Rheumatic tricuspid valve disease, unspecified: Secondary | ICD-10-CM | POA: Insufficient documentation

## 2013-07-28 DIAGNOSIS — I517 Cardiomegaly: Secondary | ICD-10-CM | POA: Insufficient documentation

## 2013-07-28 DIAGNOSIS — I080 Rheumatic disorders of both mitral and aortic valves: Secondary | ICD-10-CM | POA: Insufficient documentation

## 2013-07-28 DIAGNOSIS — I059 Rheumatic mitral valve disease, unspecified: Secondary | ICD-10-CM

## 2013-10-31 ENCOUNTER — Inpatient Hospital Stay: Payer: Medicare Other | Admitting: Internal Medicine

## 2013-10-31 ENCOUNTER — Inpatient Hospital Stay
Admission: RE | Admit: 2013-10-31 | Discharge: 2013-11-03 | DRG: 310 | Disposition: A | Discharge status: Home / Self Care | Payer: Medicare Other | Source: Ambulatory Visit | Attending: Internal Medicine | Admitting: Internal Medicine

## 2013-10-31 DIAGNOSIS — K900 Celiac disease: Secondary | ICD-10-CM | POA: Diagnosis present

## 2013-10-31 DIAGNOSIS — E785 Hyperlipidemia, unspecified: Secondary | ICD-10-CM | POA: Diagnosis present

## 2013-10-31 DIAGNOSIS — J449 Chronic obstructive pulmonary disease, unspecified: Secondary | ICD-10-CM | POA: Diagnosis present

## 2013-10-31 DIAGNOSIS — Z883 Allergy status to other anti-infective agents status: Secondary | ICD-10-CM

## 2013-10-31 DIAGNOSIS — K219 Gastro-esophageal reflux disease without esophagitis: Secondary | ICD-10-CM | POA: Diagnosis present

## 2013-10-31 DIAGNOSIS — Z0189 Encounter for other specified special examinations: Secondary | ICD-10-CM

## 2013-10-31 DIAGNOSIS — E039 Hypothyroidism, unspecified: Secondary | ICD-10-CM | POA: Diagnosis present

## 2013-10-31 DIAGNOSIS — J4489 Other specified chronic obstructive pulmonary disease: Secondary | ICD-10-CM | POA: Diagnosis present

## 2013-10-31 DIAGNOSIS — I080 Rheumatic disorders of both mitral and aortic valves: Secondary | ICD-10-CM | POA: Diagnosis present

## 2013-10-31 DIAGNOSIS — I4891 Unspecified atrial fibrillation: Principal | ICD-10-CM | POA: Diagnosis present

## 2013-10-31 DIAGNOSIS — I079 Rheumatic tricuspid valve disease, unspecified: Secondary | ICD-10-CM | POA: Diagnosis present

## 2013-10-31 DIAGNOSIS — Z7901 Long term (current) use of anticoagulants: Secondary | ICD-10-CM

## 2013-10-31 DIAGNOSIS — I10 Essential (primary) hypertension: Secondary | ICD-10-CM | POA: Diagnosis present

## 2013-10-31 HISTORY — DX: Encounter for other specified special examinations: Z01.89

## 2013-10-31 LAB — COMPREHENSIVE METABOLIC PANEL
ALT: 23 U/L (ref 0–55)
AST (SGOT): 56 U/L — ABNORMAL HIGH (ref 5–34)
Albumin/Globulin Ratio: 1.4 (ref 0.9–2.2)
Albumin: 4.1 g/dL (ref 3.5–5.0)
Alkaline Phosphatase: 62 U/L (ref 37–106)
BUN: 32 mg/dL — ABNORMAL HIGH (ref 7.0–19.0)
Bilirubin, Total: 0.5 mg/dL (ref 0.2–1.2)
CO2: 23 mEq/L (ref 22–29)
Calcium: 9.8 mg/dL (ref 7.9–10.2)
Chloride: 106 mEq/L (ref 100–111)
Creatinine: 1 mg/dL (ref 0.6–1.0)
Globulin: 3 g/dL (ref 2.0–3.6)
Glucose: 87 mg/dL (ref 70–100)
Potassium: 4.8 mEq/L (ref 3.5–5.1)
Protein, Total: 7.1 g/dL (ref 6.0–8.3)
Sodium: 140 mEq/L (ref 136–145)

## 2013-10-31 LAB — GFR: EGFR: 53.4

## 2013-10-31 LAB — CBC
Hematocrit: 40 % (ref 37.0–47.0)
Hgb: 12.8 g/dL (ref 12.0–16.0)
MCH: 31.9 pg (ref 28.0–32.0)
MCHC: 32 g/dL (ref 32.0–36.0)
MCV: 99.8 fL (ref 80.0–100.0)
MPV: 10.5 fL (ref 9.4–12.3)
Nucleated RBC: 0 /100 WBC (ref 0–1)
Platelets: 171 10*3/uL (ref 140–400)
RBC: 4.01 10*6/uL — ABNORMAL LOW (ref 4.20–5.40)
RDW: 14 % (ref 12–15)
WBC: 3.39 10*3/uL — ABNORMAL LOW (ref 3.50–10.80)

## 2013-10-31 LAB — MAGNESIUM: Magnesium: 2.4 mg/dL (ref 1.6–2.6)

## 2013-10-31 LAB — PT/INR
PT INR: 2 — ABNORMAL HIGH (ref 0.9–1.1)
PT: 24.3 s — ABNORMAL HIGH (ref 12.6–15.0)

## 2013-10-31 MED ORDER — GABAPENTIN 100 MG PO CAPS
100.0000 mg | ORAL_CAPSULE | Freq: Three times a day (TID) | ORAL | Status: DC
Start: 2013-10-31 — End: 2013-11-03
  Administered 2013-10-31 – 2013-11-03 (×8): 100 mg via ORAL
  Filled 2013-10-31 (×9): qty 1

## 2013-10-31 MED ORDER — LISINOPRIL 5 MG PO TABS
5.00 mg | ORAL_TABLET | Freq: Every day | ORAL | Status: DC
Start: 2013-10-31 — End: 2013-11-03
  Administered 2013-11-01 – 2013-11-03 (×3): 5 mg via ORAL
  Filled 2013-10-31 (×3): qty 1

## 2013-10-31 MED ORDER — WARFARIN SODIUM 5 MG PO TABS
5.0000 mg | ORAL_TABLET | Freq: Every day | ORAL | Status: DC
Start: 2013-10-31 — End: 2013-11-02
  Administered 2013-10-31 – 2013-11-01 (×2): 5 mg via ORAL
  Filled 2013-10-31 (×2): qty 1

## 2013-10-31 MED ORDER — SIMVASTATIN 40 MG PO TABS
40.0000 mg | ORAL_TABLET | Freq: Every evening | ORAL | Status: DC
Start: 2013-10-31 — End: 2013-11-03
  Administered 2013-10-31 – 2013-11-02 (×3): 40 mg via ORAL
  Filled 2013-10-31 (×3): qty 1

## 2013-10-31 MED ORDER — POLYETHYLENE GLYCOL 3350 17 G PO PACK
17.0000 g | PACK | Freq: Every day | ORAL | Status: DC | PRN
Start: 2013-10-31 — End: 2013-11-03

## 2013-10-31 MED ORDER — BISMUTH SUBSALICYLATE 262 MG/15ML PO SUSP
30.0000 mL | Freq: Four times a day (QID) | ORAL | Status: DC | PRN
Start: 2013-10-31 — End: 2013-11-03
  Filled 2013-10-31: qty 120

## 2013-10-31 MED ORDER — DOFETILIDE 250 MCG PO CAPS
250.0000 ug | ORAL_CAPSULE | Freq: Two times a day (BID) | ORAL | Status: DC
Start: 2013-10-31 — End: 2013-10-31

## 2013-10-31 MED ORDER — FUROSEMIDE 20 MG PO TABS
20.0000 mg | ORAL_TABLET | Freq: Every day | ORAL | Status: DC
Start: 2013-10-31 — End: 2013-11-03
  Administered 2013-11-01 – 2013-11-03 (×3): 20 mg via ORAL
  Filled 2013-10-31 (×3): qty 1

## 2013-10-31 MED ORDER — CETIRIZINE HCL 10 MG PO TABS
5.0000 mg | ORAL_TABLET | Freq: Every day | ORAL | Status: DC
Start: 2013-10-31 — End: 2013-11-03
  Administered 2013-11-01 – 2013-11-03 (×3): 5 mg via ORAL
  Filled 2013-10-31 (×3): qty 1

## 2013-10-31 MED ORDER — ACETAMINOPHEN 325 MG PO TABS
650.0000 mg | ORAL_TABLET | ORAL | Status: DC | PRN
Start: 2013-10-31 — End: 2013-11-03

## 2013-10-31 MED ORDER — LEVOTHYROXINE SODIUM 112 MCG PO TABS
112.0000 ug | ORAL_TABLET | Freq: Every day | ORAL | Status: DC
Start: 2013-10-31 — End: 2013-11-03
  Administered 2013-11-01 – 2013-11-03 (×3): 112 ug via ORAL
  Filled 2013-10-31 (×3): qty 1

## 2013-10-31 MED ORDER — SODIUM CHLORIDE 0.9 % IJ SOLN
3.0000 mL | Freq: Three times a day (TID) | INTRAMUSCULAR | Status: DC
Start: 2013-10-31 — End: 2013-11-03
  Administered 2013-10-31 – 2013-11-03 (×5): 3 mL via INTRAVENOUS

## 2013-10-31 MED ORDER — BISOPROLOL FUMARATE 5 MG PO TABS
5.0000 mg | ORAL_TABLET | Freq: Every day | ORAL | Status: DC
Start: 2013-10-31 — End: 2013-11-03
  Administered 2013-11-01 – 2013-11-03 (×3): 5 mg via ORAL
  Filled 2013-10-31 (×4): qty 1

## 2013-10-31 MED ORDER — ONDANSETRON HCL 4 MG/2ML IJ SOLN
4.0000 mg | Freq: Three times a day (TID) | INTRAMUSCULAR | Status: DC | PRN
Start: 2013-10-31 — End: 2013-11-03

## 2013-10-31 MED ORDER — RISAQUAD PO CAPS
1.00 | ORAL_CAPSULE | Freq: Every day | ORAL | Status: DC
Start: 2013-10-31 — End: 2013-11-03
  Administered 2013-11-01 – 2013-11-03 (×3): 1 via ORAL
  Filled 2013-10-31 (×3): qty 1

## 2013-10-31 MED ORDER — MAGNESIUM OXIDE 400 (241.3 MG) MG PO TABS
200.0000 mg | ORAL_TABLET | Freq: Every day | ORAL | Status: DC
Start: 2013-10-31 — End: 2013-11-03
  Administered 2013-11-01 – 2013-11-03 (×3): 200 mg via ORAL
  Filled 2013-10-31 (×3): qty 1

## 2013-10-31 MED ORDER — DOFETILIDE 250 MCG PO CAPS
250.0000 ug | ORAL_CAPSULE | Freq: Two times a day (BID) | ORAL | Status: DC
Start: 2013-10-31 — End: 2013-11-03
  Administered 2013-10-31 – 2013-11-03 (×6): 250 ug via ORAL
  Filled 2013-10-31 (×22): qty 1

## 2013-10-31 NOTE — Progress Notes (Signed)
Pt admitted to unit from home. VS. A-fib on tele monitor. Denies pain or discomfort. Oriented to unit and room. Safety precautions maintained. Page out to attending MD

## 2013-10-31 NOTE — Plan of Care (Signed)
Problem: Health Promotion  Goal: Knowledge - disease process  Extent of understanding conveyed about a specific disease process.   Outcome: Progressing  Pt aware of need for admission. Cont A-fib on tele monitor.     Problem: Safety  Goal: Patient will be free from injury during hospitalization  Fall mat in place, refused fall alarm.     Problem: Pain  Goal: Patient's pain/discomfort is manageable  Pt denies any pain or discomfort.

## 2013-10-31 NOTE — H&P (Signed)
[facility], [facility]  Answering service [phone]  Admission History and Physical Examination      Date Time: 10/31/2013 3:21 PM  Patient Name: [name],[name]  Attending Physician: Karma Ganja, MD  Primary Care Physician: Danford Bad, MD    CC:  Atrial fibrillation      History of Presenting Illness:   [name] is a 80 y.o. female with a PMH significant for MMP including atrial fibrillation, hypothyroidism, celiac disease who presents for tikosyn loading. She states that, lately, she has had increased symptoms from her a fib - specifically dyspnea on exertion. She had a DCCV a few years ago that worked for a few months, but hs been on a relatively slow decline since then. She was on Xarelto for [facility] but was switched to coumadin due to worsening valve disease.    Past Medical History:     Past Medical History   Diagnosis Date   . Arrhythmia    . Shortness of breath    . Benign heart murmur    . AVD (aortic valve disease)    . Celiac disease    . Scoliosis    . Hyperlipidemia    . Thyroid disease    . Hypothyroidism    . GERD (gastroesophageal reflux disease)      occasional , asymptomatic   . Anemia    . Disease of lung    . Chronic obstructive pulmonary disease    . Dyspnea on exertion    . Hypertension    . Orthopedic aftercare      scoliosis, stenosis, athritis     Available old records reviewed,    Past Surgical History:     Past Surgical History   Procedure Laterality Date   . Appendectomy     . Knee surgery     . Tonsillectomy and adenoidectomy     . Cataract extraction w/ intraocular lens implant     . Cardiac catheterization     . Orthopedic surgery         Family History:     Family History   Problem Relation Age of Onset   . Pneumonia Mother    . Heart failure Father    . Hypertension Father      Reviewed    Social History:     History     Social History   . Marital Status: Married     Spouse Name: N/A     Number of Children: N/A   . Years of Education: N/A      Social History Main Topics   . Smoking status: Never Smoker    . Smokeless tobacco: Never Used   . Alcohol Use: No      Comment: rare   . Drug Use: No   . Sexual Activity:      Other Topics Concern   . Not on file     Social History Narrative   . No narrative on file     Reviewed    Allergies:     Allergies   Allergen Reactions   . Erythromycin        Medications:     Current Discharge Medication List      CONTINUE these medications which have NOT CHANGED    Details   acetaminophen (TYLENOL) 500 MG tablet Take 500 mg by mouth 3 (three) times daily.      alendronate (FOSAMAX) 70 MG tablet Take 70 mg by mouth every  7 days. Take 1 tab by mouth every 7 days with a full glass of water on an empty stomach. Do not take anything else by mouth or lie down for 30 mins.      bisoprolol (ZEBETA) 5 MG tablet Take 5 mg by mouth daily.        Calcium Carbonate-Vit D-Min (CALCIUM 600 + MINERALS) 600-200 MG-UNIT TABS Take by mouth. Take as directed       Coenzyme Q10 (COQ10) 100 MG CAPS Take 2 tablets by mouth daily.         furosemide (LASIX) 20 MG tablet Take 20 mg by mouth daily.      gabapentin (NEURONTIN) 100 MG capsule Take 100 mg by mouth 3 (three) times daily.        Iron-Vitamin C (VITRON-C PO) Take 1 tablet by mouth twice a week.      levothyroxine (SYNTHROID, LEVOTHROID) 112 MCG tablet Take 112 mcg by mouth daily.        lisinopril (PRINIVIL,ZESTRIL) 5 MG tablet Take 5 mg by mouth daily.        loratadine (CLARITIN) 10 MG tablet Take 10 mg by mouth daily.        Magnesium 250 MG TABS Take 1 tablet by mouth daily.      Multiple Vitamin (MULTI-VITAMINS PO) Take 1 tablet by mouth.      Probiotic Product (ALIGN PO) Take 1 tablet by mouth daily.      simvastatin (ZOCOR) 40 MG tablet Take 40 mg by mouth nightly.        Vitamins C E (CRANBERRY CONCENTRATE PO) Take 500 mg by mouth daily.      warfarin (COUMADIN) 5 MG tablet Take 5 mg by mouth daily.              Review of Systems:   No headache. No Eyes, Ear, Nose, Throat  problem. No chest pain, orthopnea or shortness or breath or cough, no abdominal pain, nausea or vomiting. No pain or weakness in upper or lower extremity, no psychiatric, neurological, endocrine or hematological complaints other than above.    Physical Exam:   There were no vitals filed for this visit.    Intake and Output Summary (Last 24 hours) at Date Time  No intake or output data in the 24 hours ending 10/31/13 1521    General: Awake,  no acute distress.  HEENT: Normocephalic, No icter or conjunctival injection  Neck: supple, no lymphadenopathy, no JVD, no carotid bruits  Cardiovascular: S1, S2 itregular rhythm and rate, no murmurs, rubs or gallops  Lungs: Bilateral breath sounds, clear to auscultation bilaterally, without wheezing, rhonchi, or rales  Abdomen: soft, non-tender, non-distended; no palpable masses, no hepatosplenomegaly, normoactive bowel sounds, no rebound or guarding  Extremities: no clubbing, cyanosis, or edema  Neuro: Exam grossly Non focal,   Skin: no rashes or lesions noted      Labs:       Results    ** No results found for the last 24 hours. **          Radiology Results (24 Hour)    ** No results found for the last 24 hours. **               Assessment/ Plan     Patient Active Problem List   Diagnosis   . Celiac disease   . Scoliosis   . Atrial fibrillation   . Mitral valve disorder   . Disease of tricuspid valve   .  Chronic airway obstruction, not elsewhere classified         # Atrial fibrillation  - tikosyn loading per cardiology/EP  - continue warfarin for anticoagulation    # Hypothyroidism  - will continue synthroid  - check a TSH level    # celiac disease  - avoid gluten. Will order appropriate diet.    # Prophy  - on therapeutic AC  - tolerating PO    # Dispo  - when cleared by cards/EP    Signed by: Antoine Poche, MD   ZO:[id number], Molli Posey, MD

## 2013-10-31 NOTE — Plan of Care (Signed)
A-fib on tele monitor. Waiting for cardiology to see pt. Pt denies any pain or discomfort. Safety precautions maintained. Fall mat in place, refused fall alarm. Pt steady gait.

## 2013-10-31 NOTE — Progress Notes (Addendum)
EP notified of pt need for Tikosyn therapy. Spoke to Dr. Allena Katz and Dr. Loraine Leriche EP. Order per Dr Loraine Leriche is to start Tikosyn BID, 0700 & 1900.

## 2013-11-01 LAB — ECG 12-LEAD
Q-T Interval: 388 ms
Q-T Interval: 414 ms
Q-T Interval: 412 ms
QRS Duration: 116 ms
QRS Duration: 118 ms
QRS Duration: 114 ms
QTC Calculation (Bezet): 427 ms
QTC Calculation (Bezet): 456 ms
QTC Calculation (Bezet): 475 ms
R Axis: -11 degrees
R Axis: -20 degrees
R Axis: -46 degrees
T Axis: 7 degrees
T Axis: 12 degrees
T Axis: 57 degrees
Ventricular Rate: 73 {beats}/min
Ventricular Rate: 73 {beats}/min
Ventricular Rate: 80 {beats}/min

## 2013-11-01 LAB — CBC
Hematocrit: 37.3 % (ref 37.0–47.0)
Hgb: 11.7 g/dL — ABNORMAL LOW (ref 12.0–16.0)
MCH: 31.3 pg (ref 28.0–32.0)
MCHC: 31.4 g/dL — ABNORMAL LOW (ref 32.0–36.0)
MCV: 99.7 fL (ref 80.0–100.0)
MPV: 10 fL (ref 9.4–12.3)
Nucleated RBC: 0 /100 WBC (ref 0–1)
Platelets: 151 10*3/uL (ref 140–400)
RBC: 3.74 10*6/uL — ABNORMAL LOW (ref 4.20–5.40)
RDW: 13 % (ref 12–15)
WBC: 3.23 10*3/uL — ABNORMAL LOW (ref 3.50–10.80)

## 2013-11-01 LAB — BASIC METABOLIC PANEL
BUN: 23 mg/dL — ABNORMAL HIGH (ref 7.0–19.0)
CO2: 23 mEq/L (ref 22–29)
Calcium: 8.8 mg/dL (ref 7.9–10.2)
Chloride: 110 mEq/L (ref 100–111)
Creatinine: 0.9 mg/dL (ref 0.6–1.0)
Glucose: 85 mg/dL (ref 70–100)
Potassium: 4.2 mEq/L (ref 3.5–5.1)
Sodium: 141 mEq/L (ref 136–145)

## 2013-11-01 LAB — PT/INR
PT INR: 2.2 — ABNORMAL HIGH (ref 0.9–1.1)
PT: 24.6 s — ABNORMAL HIGH (ref 12.6–15.0)

## 2013-11-01 LAB — MAGNESIUM: Magnesium: 2.1 mg/dL (ref 1.6–2.6)

## 2013-11-01 LAB — GFR: EGFR: >60

## 2013-11-01 NOTE — Plan of Care (Signed)
Problem: Safety  Goal: Patient will be free from injury during hospitalization  Outcome: Progressing  Pt received 3rd dose of tikosyn, tolerating well, no c/o CP or SOB. Afib on telemetry. Pt independent in room, ambulates frequently with no difficulties, refuses bed alarm, fall mat in place, pt educated on falls risks and verbalizes understanding. Plan for NPO at MN for TEE/CV at 1100 tomorrow.

## 2013-11-01 NOTE — Progress Notes (Signed)
Case Management Initial Discharge Planning Assessment      Psychosocial/Demographic Information   Name of interviewee: [name] - patient    Healthcare Decision Maker (HDM) (if other than the patient) self   Pt lives with Her husband   Type of residence where patient lives Living Arrangements: Spouse/significant other]   ]House, rambler-style   ]     Prior level of functioning (ambulation & ADLs)  ]Independent w/ ADLs, ambulates w/o assistive device, still drives   ]     Correct Insurance listed on face sheet - verified with the patient/HDM Medicare Part A and B  [employer]      Any additional emergency contacts? Extended Emergency Contact Information  Primary Emergency Contact: Prevette,John  Address: [address] [place], [place] [address] [place]  Home Phone: [phone]  Mobile Phone: [phone]  Relation: Spouse   Does the patient have an Advance Directive? <no information>  Advance Directive: Patient has advance directive, copy not in chart]     Is the POA/Guardianship documentation in shadow chart? (if applicable)  Healthcare Agent Appointed: Yes]  Healthcare Agent's Name: Merri Brunette ]  Healthcare Agent's Phone Number: [phone]]   Source of Income (SSDI. SSI. Social Security, pension, employment, Catering manager)      Economist in Place  Name of Primary Care Physician verified in patient banner (update in patient banner if not listed).  If no PCP, call [phone] with patient in room to get them connected with a PCP. Danford Bad, MD  [phone]   What DME does the patient currently own/have at home? (rolling walker, hospital bed, home O2, BiPAP/CPAP, bedside commode, cane, hoyer lift)  ]  Assistive Devices: None]   ]   Has the patient been to an Acute Rehab or SNF in the past?  If so, where? no   Does the patient currently have home health or hospice/palliative services in place?  If so, list agency name. no   Would the patient benefit from a PT/OT order? If so, is it  ordered? no   Does the patient already have community dialysis set up?  If so, where? no     Readmission Assessment  Current LACE Score Reviewed? Yes/No Yes   Is this patient an inpatient to inpatient 30 day readmission? no   Does the patient have difficulty obtaining his/her medications? no   Follow-up appt made with: [facility], [facility] or private pcp? no   Does the patient have difficulty getting to his/her appointment? no       Anticipated Discharge Plan  Discussed Anticipated Discharge Date and Discharge Disposition Possibilities with: _X__Patient   ___Healthcare Decision Maker  ___Other   Anticipated Disposition: Option A Home   Anticipated Disposition: Option B    Who will transport the patient when ready for discharge? (offer wheelchair [place] service if patient/family cannot identify transport plan) Pt's husband will transport   If applicable, were SNF or Hospice choices provided? no   Palliative Care Consult needed? (if yes, contact attending MD)  no   Geriatrics Consult needed? (if yes, contact attending MD) no   Elderlink Referral needed? (if yes, refer through [facility]) no   TCM Referral needed? (if yes, refer through [facility]) no   PACE Referral needed? (if yes, refer through [facility]) no   Are there any potential barriers to discharge identified?      ___Lack of Insurance  ___Lack of Health  Literacy  ___Undocumented  ___No resources for meds or medical care  ___Transportation issues  ___Language/Cultural/Spiritual  ___Cognitive level / capacity  ___Psychiatric or substance abuse issues  ___Co-morbidities  ___Potential abuse or neglect  ___Safety issues in the home  ___Potential placement issues  ___Pt / family disagreement with d/c plan  ___Lack of family support  ___Lack of extended family / friend support  ___Home Estate agent (multi-level home/access          issues)   _X__ NONE     Inpatient Medicare/Medicare HMO Patients Only  Was an initial IMM signed within 24 hours of admission?  (Look in Media  Tab, Documents Table or Shadow Chart)  ]Yes         COMMENTS: Pt states she and her husband are both independent w/ ADLs; pt had no concerns re: d/c at this time.     Lourdes Sledge, MSW  Social Worker II  [facility]  [phone]

## 2013-11-01 NOTE — Plan of Care (Signed)
A&Ox4. Independent. rec'd 2 doses of tikosyn (2000 and 0700),  (250 mcg each). Awaiting EKG at 0900 am post 2nd dose. AF on tele. HR controlled. VSS. Denies pain, SOB or dizziness. Safety is maintained. Will cont to monitor pt and cont with the plan of care.

## 2013-11-01 NOTE — Progress Notes (Signed)
Talked with Dr. Loraine Leriche EP on the phone approx [phone]. Notified him about manual calculation QT and QTC 400/462. He said OK to give the 2nd dose but make it at 0700 am.

## 2013-11-01 NOTE — Progress Notes (Signed)
[facility], [facility]  Answering service [phone]  Progress Note    Date Time: 11/01/2013 12:33 PM  Patient Name: [name]  Attending Physician: Antoine Poche, *      Subjective:   Patient Seen and Examined. The notes from the last 24 hours were reviewed. Discussed with patient's family member and nursing at length.        Physical Exam:     Filed Vitals:    11/01/13 1204   BP: 123/65   Pulse: 65   Temp: 97.4 F (36.3 C)   Resp: 18   SpO2: 96%       Intake and Output Summary (Last 24 hours) at Date Time  No intake or output data in the 24 hours ending 11/01/13 1233    General: Awake, no acute distress.  HEENT: Normocephalic. No icter or congestion  Neck: supple, no lymphadenopathy, no thyromegaly, no JVD, no carotid bruits  Cardiovascular: S1, S2 regular rhythm and rate, no murmurs, rubs or gallops  Lungs: Bilateral breath sounds, clear to auscultation bilaterally, No wheezing, rhonchi, or rales  Abdomen: soft, non-tender, non-distended; no palpable masses,  no rebound or guarding  Extremities: no clubbing, cyanosis, or edema  Neuro: Exam grossly  non focal  Skin: no rashes or lesions noted      Meds:     Scheduled Meds:  Current Facility-Administered Medications   Medication Dose Route Frequency   . bisoprolol  5 mg Oral Daily   . cetirizine  5 mg Oral Daily   . dofetilide  250 mcg Oral Q12H SCH   . furosemide  20 mg Oral Daily   . gabapentin  100 mg Oral Q8H SCH   . lactobacillus/streoptococcus  1 capsule Oral Daily   . levothyroxine  112 mcg Oral Daily at 0600   . lisinopril  5 mg Oral Daily   . magnesium oxide  200 mg Oral Daily   . simvastatin  40 mg Oral QHS   . sodium chloride (PF)  3 mL Intravenous Q8H   . warfarin  5 mg Oral Daily at 1800     Continuous Infusions:   PRN Meds:.acetaminophen, bismuth subsalicylate, ondansetron, polyethylene glycol  I personally reviewed all of the medications        Labs:     Results    Procedure Component Value Units Date/Time     Basic Metabolic Panel [[id number]]  (Abnormal) Collected:  11/01/13 0404    Specimen Information:  Blood Updated:  11/01/13 0448     Glucose 85 mg/dL      BUN 52.8 (H) mg/dL      Creatinine 0.9 mg/dL      CALCIUM 8.8 mg/dL      Sodium 413 mEq/L      Potassium 4.2 mEq/L      Chloride 110 mEq/L      CO2 23 mEq/L     Magnesium [[id number]] Collected:  11/01/13 0404    Specimen Information:  Blood Updated:  11/01/13 0448     Magnesium 2.1 mg/dL     GFR [[id number]] Collected:  11/01/13 0404     EGFR >60.0 Updated:  11/01/13 0448    Prothrombin time/INR [[id number]]  (Abnormal) Collected:  11/01/13 0404    Specimen Information:  Blood Updated:  11/01/13 0439     PT 24.6 (H) sec      PT INR 2.2 (H)      PT Anticoag. Given Within 48 hrs. warfarin ([place]  CBC [[id number]]  (Abnormal) Collected:  11/01/13 0404    Specimen Information:  Blood / Blood Updated:  11/01/13 0423     WBC 3.23 (L) x10 3/uL      RBC 3.74 (L) x10 6/uL      Hgb 11.7 (L) g/dL      Hematocrit 40.9 %      MCV 99.7 fL      MCH 31.3 pg      MCHC 31.4 (L) g/dL      RDW 13 %      Platelets 151 x10 3/uL      MPV 10.0 fL      Nucleated RBC 0 /100 WBC     Magnesium [[id number]] Collected:  10/31/13 1553    Specimen Information:  Blood Updated:  10/31/13 1636     Magnesium 2.4 mg/dL     GFR [[id number]] Collected:  10/31/13 1553     EGFR 53.4 Updated:  10/31/13 1636    Comprehensive metabolic panel [[id number]]  (Abnormal) Collected:  10/31/13 1553    Specimen Information:  Blood Updated:  10/31/13 1636     Glucose 87 mg/dL      BUN 52.8 (H) mg/dL      Creatinine 1.0 mg/dL      Sodium 413 mEq/L      Potassium 4.8 mEq/L      Chloride 106 mEq/L      CO2 23 mEq/L      CALCIUM 9.8 mg/dL      Protein, Total 7.1 g/dL      Albumin 4.1 g/dL      AST (SGOT) 56 (H) U/L      ALT 23 U/L      Alkaline Phosphatase 62 U/L      Bilirubin, Total 0.5 mg/dL      Globulin 3.0 g/dL      Albumin/Globulin Ratio 1.4     Prothrombin time/INR [[id number]]  (Abnormal) Collected:  10/31/13 1553     Specimen Information:  Blood Updated:  10/31/13 1621     PT 24.3 (H) sec      PT INR 2.0 (H)      PT Anticoag. Given Within 48 hrs. warfarin ([place]     CBC [[id number]]  (Abnormal) Collected:  10/31/13 1553    Specimen Information:  Blood / Blood Updated:  10/31/13 1614     WBC 3.39 (L) x10 3/uL      RBC 4.01 (L) x10 6/uL      Hgb 12.8 g/dL      Hematocrit 74.2 %      MCV 99.8 fL      MCH 31.9 pg      MCHC 32.0 g/dL      RDW 14 %      Platelets 171 x10 3/uL      MPV 10.5 fL      Nucleated RBC 0 /100 WBC            Radiology Results (24 Hour)    ** No results found for the last 24 hours. **                   Assessment/ Plan     Patient Active Problem List   Diagnosis   . Celiac disease   . Scoliosis   . Atrial fibrillation   . Mitral valve disorder   . Disease of tricuspid valve   . Chronic airway obstruction, not elsewhere classified         #  Atrial fibrillation  - tikosyn loading per cardiology/EP  - continue warfarin for anticoagulation    # Hypothyroidism  - will continue synthroid  - check a TSH level    # celiac disease  - gluten free diet.    # Prophy  - on therapeutic AC  - tolerating PO    # Dispo  - when cleared by cards/EP      Signed by: Antoine Poche, MD

## 2013-11-01 NOTE — Consults (Addendum)
[facility]  EP CONSULTATION REPORT  [facility]      Date Time: 11/01/2013 8:36 AM Patient Name: [name],[name]  Requesting Physician: Antoine Poche, *  Medical Record #:  [id number]  Account#:  [id number]  Admission Date:  10/31/2013      Assessment:   1.  Atrial fibrillation, persistent.   2.  Marked biatrial dilatation.   3.  Patent foramen ovale with a left to right shunt.   4.  Severe mitral and severe tricuspid regurgitation  5.         Switched from xarelto to coumadin given severe valvular disease  6          Recent INRs from office   10/05/13        3.1              10/12/13        2.3              10/19/13        2.6  10/26/13        2.6  10/31/13        2.0    Recommendations:    CrCl 54 would continue tikosyn BID, post first dose EKG QTc , 2nd dose stable    Continue tikosyn BID   In Afib with CVR, INR therapeutic continue coumadin, Mag 2.1   Will schedule DCCV if patient doesn't convert Thursday AM, as patient has a severely dilated LA    Patient seen and examined, my assessment and plans as noted above.  80 year old female with a history of COPD, moderate MR, severe TR, and persistent AF, referred by her primary cardiologist for dofetilide initiation.  Currently has had 2 doses at reduced dose, with tolerable QTc.  Remains in rate controlled AF, with rare ventricular HR spikes and bradycardia in 40s with sleep.  Therapeutic INRs confirmed.  Ok at this juncture to continue dofetilide initiation.  If has recurrent AF, would consider trial of amiodarone.  Suspect that AF is secondary to signficant valvular disease, which may need to be addressed to improve arrhythmia control.  No surgical plan at this time, with improved MR on recent TTE.  Hopefully her symptoms with improve with maintaining normal Rhythm.  Will plan on DCCV tomorrow if remains in AF after 4th dose.    Signed by    Thurnell Lose, MD      Reason for Consultation:   Afib and  DOE    History:   [name] is a 80 Young.o. female admitted on 10/31/2013, [name] is a 80 Young.o. female with a PMH significant for persistent atrial fibrillation, hypothyroidism, celiac disease who presents for tikosyn loading. She states that, lately, she has had increased symptoms from her a fib - specifically dyspnea on exertion. She had a DCCV a few years ago that worked for a few months, but has been on a relatively slow decline since then. She was on Xarelto for [facility] but was switched to coumadin due to worsening valve disease  Past Medical History:     Past Medical History   Diagnosis Date   . Arrhythmia    . Shortness of breath    . Benign heart murmur    . AVD (aortic valve disease)    . Celiac disease    . Scoliosis    . Hyperlipidemia    . Thyroid disease    .  Hypothyroidism    . GERD (gastroesophageal reflux disease)      occasional , asymptomatic   . Anemia    . Disease of lung    . Chronic obstructive pulmonary disease    . Dyspnea on exertion    . Hypertension    . Orthopedic aftercare      scoliosis, stenosis, athritis       Past Surgical History:     Past Surgical History   Procedure Laterality Date   . Appendectomy     . Knee surgery     . Tonsillectomy and adenoidectomy     . Cataract extraction w/ intraocular lens implant     . Cardiac catheterization     . Orthopedic surgery         Family History:     Family History   Problem Relation Age of Onset   . Pneumonia Mother    . Heart failure Father    . Hypertension Father        Social History:     History     Social History   . Marital Status: Married     Spouse Name: N/A     Number of Children: N/A   . Years of Education: N/A     Social History Main Topics   . Smoking status: Never Smoker    . Smokeless tobacco: Never Used   . Alcohol Use: No      Comment: rare   . Drug Use: No   . Sexual Activity:      Other Topics Concern   . Not on file     Social History Narrative   . No narrative on file       Allergies:     Allergies   Allergen  Reactions   . Erythromycin        Medications:     Prescriptions prior to admission   Medication Sig   . acetaminophen (TYLENOL) 500 MG tablet Take 500 mg by mouth 3 (three) times daily.   Marland Kitchen alendronate (FOSAMAX) 70 MG tablet Take 70 mg by mouth every 7 days. Take 1 tab by mouth every 7 days with a full glass of water on an empty stomach. Do not take anything else by mouth or lie down for 30 mins.   . bisoprolol (ZEBETA) 5 MG tablet Take 5 mg by mouth daily.     . Calcium Carbonate-Vit D-Min (CALCIUM 600 + MINERALS) 600-200 MG-UNIT TABS Take by mouth. Take as directed    . Coenzyme Q10 (COQ10) 100 MG CAPS Take 2 tablets by mouth daily.      . furosemide (LASIX) 20 MG tablet Take 20 mg by mouth daily.   Marland Kitchen gabapentin (NEURONTIN) 100 MG capsule Take 100 mg by mouth 3 (three) times daily.     . Iron-Vitamin C (VITRON-C PO) Take 1 tablet by mouth twice a week.   . levothyroxine (SYNTHROID, LEVOTHROID) 112 MCG tablet Take 112 mcg by mouth daily.     Marland Kitchen lisinopril (PRINIVIL,ZESTRIL) 5 MG tablet Take 5 mg by mouth daily.     Marland Kitchen loratadine (CLARITIN) 10 MG tablet Take 10 mg by mouth daily.     . Magnesium 250 MG TABS Take 1 tablet by mouth daily.   . Multiple Vitamin (MULTI-VITAMINS PO) Take 1 tablet by mouth.   . Probiotic Product (ALIGN PO) Take 1 tablet by mouth daily.   . simvastatin (ZOCOR) 40 MG tablet Take 40 mg by mouth nightly.     Marland Kitchen  warfarin (COUMADIN) 5 MG tablet Take 5 mg by mouth daily.   . [DISCONTINUED] Vitamins C E (CRANBERRY CONCENTRATE PO) Take 500 mg by mouth daily.       Current Facility-Administered Medications   Medication Dose Route Frequency Provider Last Rate Last Dose   . acetaminophen (TYLENOL) tablet 650 mg  650 mg Oral Q4H PRN Aje, Temilolu Lady Gary, MD       . bismuth subsalicylate (PEPTO BISMOL) 262 MG/15ML suspension 30 mL  30 mL Oral Q6H PRN Aje, Temilolu Olayinka, MD       . bisoprolol (ZEBETA) tablet 5 mg  5 mg Oral Daily Aje, Temilolu Olayinka, MD       . cetirizine (ZyrTEC) tablet 5 mg  5  mg Oral Daily Aje, Temilolu Olayinka, MD       . dofetilide (TIKOSYN) capsule 250 mcg  250 mcg Oral Q12H [facility] Otis Dials, MD   250 mcg at 11/01/13 [id number]   . furosemide (LASIX) tablet 20 mg  20 mg Oral Daily Aje, Temilolu Olayinka, MD       . gabapentin (NEURONTIN) capsule 100 mg  100 mg Oral Q8H SCH Aje, Marlane Mingle, MD   100 mg at 11/01/13 0456   . lactobacillus/streoptococcus (RISAQUAD) capsule 1 capsule  1 capsule Oral Daily Aje, Temilolu Olayinka, MD       . levothyroxine (SYNTHROID, LEVOTHROID) tablet 112 mcg  112 mcg Oral Daily at 0600 Aje, Marlane Mingle, MD   112 mcg at 11/01/13 0457   . lisinopril (PRINIVIL,ZESTRIL) tablet 5 mg  5 mg Oral Daily Aje, Temilolu Olayinka, MD       . magnesium oxide (MAG-OX) tablet 200 mg  200 mg Oral Daily Aje, Temilolu Olayinka, MD       . ondansetron (ZOFRAN) injection 4 mg  4 mg Intravenous Q8H PRN Aje, Temilolu Olayinka, MD       . polyethylene glycol (MIRALAX) packet 17 g  17 g Oral QD PRN Aje, Marlane Mingle, MD       . simvastatin (ZOCOR) tablet 40 mg  40 mg Oral QHS Aje, Marlane Mingle, MD   40 mg at 10/31/13 2251   . sodium chloride (PF) 0.9 % flush 3 mL  3 mL Intravenous Q8H Patel, Dhaval R, MD   3 mL at 10/31/13 2001   . warfarin (COUMADIN) tablet 5 mg  5 mg Oral Daily at 1800 Aje, Marlane Mingle, MD   5 mg at 10/31/13 1843         Review of Systems:    Comprehensive review of systems including constitutional, eyes, ears, nose, mouth, throat, cardiovascular, GI, GU, musculoskeletal, integumentary, respiratory, neurologic, psychiatric, and endocrine is negative other than what is mentioned already in the history of present illness    Physical Exam:     VITAL SIGNS PHYSICAL EXAM   Filed Vitals:    11/01/13 0749   BP: 147/70   Pulse: 65   Temp: 97.6 F (36.4 C)   Resp: 18   SpO2: 96%     Temp (24hrs), Avg:97.5 F (36.4 C), Min:97.3 F (36.3 C), Max:97.9 F (36.6 C)      Intake and Output Summary (Last 24 hours) at Date Time  No intake or output  data in the 24 hours ending 11/01/13 0836    Telemetry: AF with CVR and SVR Physical Exam  General: awake, alert, breathing comfortable, no acute distress  Head: normocephalic  Eyes: EOM's intact  Cardiovascular: iregular irreg rate and rhythm,, no S3, no  S4, no murmurs, rubs or gallops  Neck: no carotid bruits or JVD  Lungs: clear to auscultation bilateraly, without wheezing, rhonchi, or rales  Abdomen: soft, non-tender, non-distended; no palpable masses,  normoactive bowel sounds  Extremities: no edema  Pulse: equal pulses, 4/4 symmetric  Neurological: Alert and oriented X3, mood and affect normal       Labs Reviewed:   No results for input(s): CK, TROPI, TROPT, CKMBINDEX in the last 168 hours.  No results for input(s): DIG in the last 168 hours.  No results for input(s): CHOL, TRIG, HDL, LDL in the last 168 hours.    Recent Labs  Lab 10/31/13  1553   Bilirubin, Total 0.5   Protein, Total 7.1   Albumin 4.1   ALT 23   AST (SGOT) 56*       Recent Labs  Lab 11/01/13  0404   Magnesium 2.1       Recent Labs  Lab 11/01/13  0404   PT 24.6*   PT INR 2.2*       Recent Labs  Lab 11/01/13  0404 10/31/13  1553   WBC 3.23* 3.39*   Hgb 11.7* 12.8   Hematocrit 37.3 40.0   Platelets 151 171       Recent Labs  Lab 11/01/13  0404 10/31/13  1553   Sodium 141 140   Potassium 4.2 4.8   Chloride 110 106   CO2 23 23   BUN 23.0* 32.0*   Creatinine 0.9 1.0   EGFR >60.0 53.4   Glucose 85 87   CALCIUM 8.8 9.8           Radiology   Telemetry: Afib with SVR    Echo 2/15: 1. Systolic function is normal.  2. Visually estimated ejection fraction is 60%.  3. The left atrium is severely dilated.  4. Moderate mitral valve regurgitation.  5. Moderate aortic valve regurgitation.  6. The right ventricle is dilated.  7. Moderate-severe tricuspid valve regurgitation.    Signed by: Edythe Lynn, PA-C    [facility]  NP Spectralink [phone] (8am-5pm)  MD Spectralink [phone] (8am-5pm)  Arrhythmia Spectralink [phone] (8am-4:30pm)  [facility]  Office Number [phone] (if EP line unavailable)  After hours, non urgent consult line [phone]  After Hours, urgent consults [phone]

## 2013-11-02 ENCOUNTER — Encounter: Payer: Medicare Other | Admitting: Anesthesiology

## 2013-11-02 ENCOUNTER — Inpatient Hospital Stay: Payer: Medicare Other | Admitting: Anesthesiology

## 2013-11-02 ENCOUNTER — Inpatient Hospital Stay: Payer: Medicare Other

## 2013-11-02 LAB — PT/INR
PT INR: 1.9 — ABNORMAL HIGH (ref 0.9–1.1)
PT: 23.3 s — ABNORMAL HIGH (ref 12.6–15.0)

## 2013-11-02 LAB — ECG 12-LEAD
Atrial Rate: 65 {beats}/min
Atrial Rate: 85 {beats}/min
Q-T Interval: 400 ms
Q-T Interval: 408 ms
QRS Duration: 116 ms
QRS Duration: 112 ms
QTC Calculation (Bezet): 461 ms
QTC Calculation (Bezet): 446 ms
R Axis: -45 degrees
R Axis: -56 degrees
T Axis: 59 degrees
T Axis: -5 degrees
Ventricular Rate: 80 {beats}/min
Ventricular Rate: 72 {beats}/min

## 2013-11-02 LAB — BASIC METABOLIC PANEL
BUN: 19 mg/dL (ref 7.0–19.0)
CO2: 24 mEq/L (ref 22–29)
Calcium: 9.1 mg/dL (ref 7.9–10.2)
Chloride: 109 mEq/L (ref 100–111)
Creatinine: 0.9 mg/dL (ref 0.6–1.0)
Glucose: 82 mg/dL (ref 70–100)
Potassium: 4.1 mEq/L (ref 3.5–5.1)
Sodium: 143 mEq/L (ref 136–145)

## 2013-11-02 LAB — CBC
Hematocrit: 39.3 % (ref 37.0–47.0)
Hgb: 12.8 g/dL (ref 12.0–16.0)
MCH: 32.2 pg — ABNORMAL HIGH (ref 28.0–32.0)
MCHC: 32.6 g/dL (ref 32.0–36.0)
MCV: 99 fL (ref 80.0–100.0)
MPV: 10.6 fL (ref 9.4–12.3)
Nucleated RBC: 0 /100 WBC (ref 0–1)
Platelets: 174 10*3/uL (ref 140–400)
RBC: 3.97 10*6/uL — ABNORMAL LOW (ref 4.20–5.40)
RDW: 13 % (ref 12–15)
WBC: 3.52 10*3/uL (ref 3.50–10.80)

## 2013-11-02 LAB — MAGNESIUM: Magnesium: 2.2 mg/dL (ref 1.6–2.6)

## 2013-11-02 LAB — GFR: EGFR: >60

## 2013-11-02 LAB — TSH: TSH: 0.47 u[IU]/mL (ref 0.35–4.94)

## 2013-11-02 MED ORDER — WARFARIN SODIUM 5 MG PO TABS
10.0000 mg | ORAL_TABLET | Freq: Once | ORAL | Status: AC
Start: 2013-11-02 — End: 2013-11-02
  Administered 2013-11-02: 10 mg via ORAL
  Filled 2013-11-02: qty 2

## 2013-11-02 MED ORDER — PROPOFOL 10 MG/ML IV EMUL
INTRAVENOUS | Status: DC | PRN
Start: 2013-11-02 — End: 2013-11-02
  Administered 2013-11-02: 60 mg via INTRAVENOUS

## 2013-11-02 MED ORDER — ENOXAPARIN SODIUM 80 MG/0.8ML SC SOLN
1.0000 mg/kg | Freq: Once | SUBCUTANEOUS | Status: DC
Start: 2013-11-02 — End: 2013-11-02

## 2013-11-02 MED ORDER — PROPOFOL 10 MG/ML IV EMUL
INTRAVENOUS | Status: AC
Start: 2013-11-02 — End: ?
  Filled 2013-11-02: qty 20

## 2013-11-02 MED ORDER — ENOXAPARIN SODIUM 80 MG/0.8ML SC SOLN
1.0000 mg/kg | Freq: Once | SUBCUTANEOUS | Status: AC
Start: 2013-11-02 — End: 2013-11-02
  Administered 2013-11-02: 70 mg via SUBCUTANEOUS
  Filled 2013-11-02: qty 0.8

## 2013-11-02 MED ORDER — LACTATED RINGERS IV SOLN
INTRAVENOUS | Status: DC | PRN
Start: 2013-11-02 — End: 2013-11-02

## 2013-11-02 NOTE — Progress Notes (Signed)
[facility]   PROGRESS NOTE     [facility]       Date Time: 11/02/2013 4:53 PM Patient Name: [name], [name]  Medical Record #:  [id number] Account#:  [id number] Admission Date:  10/31/2013      Assessment:    Atrial fibrillation, now in NSR after tikosyn and cardioversion   Biatrial enlargement   Severe mitral regurgitation   Coumadin anticoagulation with INR slightly below therapeutic (usual coumadin dose 5 mg daily and 7.5 on Wednesday    Recommendations    Continue tikosyn 250 bid   Give 10 mg dose of coumadin tonight   Lovenox to coumadin until INR 2.0   Possible d/c after dose 6 of tikosyn and post ekg tomorrow am   Will go home with one week supply of tikosyn      Patient Active Problem List   Diagnosis   . Celiac disease   . Scoliosis   . Atrial fibrillation   . Mitral valve disorder   . Disease of tricuspid valve   . Chronic airway obstruction, not elsewhere classified         Medications:      Scheduled Meds:      bisoprolol 5 mg Oral Daily   cetirizine 5 mg Oral Daily   dofetilide 250 mcg Oral Q12H SCH   furosemide 20 mg Oral Daily   gabapentin 100 mg Oral Q8H SCH   lactobacillus/streoptococcus 1 capsule Oral Daily   levothyroxine 112 mcg Oral Daily at 0600   lisinopril 5 mg Oral Daily   magnesium oxide 200 mg Oral Daily   simvastatin 40 mg Oral QHS   sodium chloride (PF) 3 mL Intravenous Q8H   warfarin 10 mg Oral Once            Subjective:   Feels well overall, no clear side effect from [employer]    Physical Exam:     VITAL SIGNS PHYSICAL EXAM   Temp:  [97.4 F (36.3 C)-97.9 F (36.6 C)] 97.4 F (36.3 C)  Heart Rate:  [48-77] 55  Resp Rate:  [12-28] 28  BP: (93-133)/(51-90) 116/67 mmHg  FiO2:  [20.9 %-21 %] 21 %  Temp (24hrs), Avg:97.5 F (36.4 C), Min:97.4 F (36.3 C), Max:97.9 F (36.6 C)      Telemetry: Reviewed - NSR    No intake or output data in the 24 hours ending 11/02/13 1653 Physical Exam  General: awake, alert, breathing comfortable, no acute  distress  Head: normocephalic  Eyes: EOM's intact  Cardiovascular: regular rate and rhythm, S1, S2, no S3, no S4, no murmurs, rubs or gallops  Neck: no carotid bruits or JVD  Lungs: clear to auscultation bilaterally, without wheezing, rhonchi, or rales  Abdomen: soft, non-tender, non-distended; no palpable masses,  normoactive bowel sounds  Extremities: no edema  Pulse: equal pulses, 4/4 symmetric  Neurological: Alert and oriented X3, mood and affect normal  Musculoskeletal:  Good strength and tone     Tele shows NSR with PACs    Labs:       Recent Labs  Lab 11/02/13  0141   Magnesium 2.2       Recent Labs  Lab 11/02/13  0141   PT 23.3*   PT INR 1.9*       Recent Labs  Lab 11/02/13  0141 11/01/13  0404 10/31/13  1553   WBC 3.52 3.23* 3.39*   Hgb 12.8 11.7* 12.8   Hematocrit 39.3 37.3 40.0   Platelets 174  151 171       Recent Labs  Lab 11/02/13  0141 11/01/13  0404 10/31/13  1553   Sodium 143 141 140   Potassium 4.1 4.2 4.8   Chloride 109 110 106   CO2 24 23 23    BUN 19.0 23.0* 32.0*   Creatinine 0.9 0.9 1.0   EGFR >60.0 >60.0 53.4   Glucose 82 85 87   CALCIUM 9.1 8.8 9.8       Recent Labs  Lab 11/02/13  0141   Thyroid Stimulating Hormone 0.47        Estimated Creatinine Clearance: 45.6 mL/min (based on Cr of 0.9).      Signed by     Ida Rogue, MD    [facility]  [facility]    Arrhythmia Spectralink [phone] (8am-4:30pm)  After hours, non urgent consult line [phone]  After Hours, urgent consults [phone]  For EP questions or consults during the week, easiest to call Spectralink [phone]   You can also reach [place] 24/7 through our main EP office [phone].

## 2013-11-02 NOTE — Progress Notes (Signed)
Notified Dr. Amado Coe of pt QTC level. Order was to give the same dose 250 mcg at 0700 am. Will cont to monitor.

## 2013-11-02 NOTE — Progress Notes (Signed)
Pt tolerated procedure well.  VSS and back to baseline.  Pt denies pain and SOB and nausea.  Dr. Marinell Blight explained procedure and results to pt  who verbalized understanding. Report called to RN Page.  Pt taken via stretcher by transport  back to room with belongings in tow.

## 2013-11-02 NOTE — Procedures (Signed)
[facility] Operative Note  Direct Current Cardioversion    Pre-op Diagnosis: Atrial Fibrillation    Post-op Diagnosis: Sinus rhythm    Procedure Note:    The patient was brought to the [facility] in the fasting state. The patient was informed regarding the risk/benefits of DCCV, including, but not limited to, proarrhythmia, superficial skin burns, stroke. The patient understood and was willing to proceed. Anesthesia service was present to provide adequate sedation.     Informed consent was obtained and boarding pass verified. Defibrillator pads were placed in the AP position. All providers were cleared from the cart.     A single synchronized biphasic shock at 150 Joules was administered to the patient. The patient's rhythm converted to sinus rhythm.     The patient's vitals were monitored thoughout the procedure and remained stable. There were no focal neurologic deficits on emergence from anasthesia.     Advait Buice, MD FACC  [facility]  [phone]

## 2013-11-02 NOTE — Progress Notes (Signed)
[facility], [facility]  Answering service [phone]  Progress Note    Date Time: 11/02/2013 12:11 PM  Patient Name: [name],[name]  Attending Physician: Antoine Poche, *      Subjective:   Patient Seen and Examined. The notes from the last 24 hours were reviewed. Discussed with patient's family member and nursing at length.        Physical Exam:     Filed Vitals:    11/02/13 1205   BP: 116/67   Pulse: 55   Temp:    Resp: 28   SpO2: 95%       Intake and Output Summary (Last 24 hours) at Date Time    Intake/Output Summary (Last 24 hours) at 11/02/13 1211  Last data filed at 11/01/13 1300   Gross per 24 hour   Intake    320 ml   Output      0 ml   Net    320 ml       General: Awake, no acute distress.  HEENT: Normocephalic. No icter or congestion  Neck: supple, no lymphadenopathy, no thyromegaly, no JVD, no carotid bruits  Cardiovascular: S1, S2 regular rhythm and rate, no murmurs, rubs or gallops  Lungs: Bilateral breath sounds, clear to auscultation bilaterally, No wheezing, rhonchi, or rales  Abdomen: soft, non-tender, non-distended; no palpable masses,  no rebound or guarding  Extremities: no clubbing, cyanosis, or edema  Neuro: Exam grossly  non focal  Skin: no rashes or lesions noted      Meds:     Scheduled Meds:  Current Facility-Administered Medications   Medication Dose Route Frequency   . bisoprolol  5 mg Oral Daily   . cetirizine  5 mg Oral Daily   . dofetilide  250 mcg Oral Q12H SCH   . furosemide  20 mg Oral Daily   . gabapentin  100 mg Oral Q8H SCH   . lactobacillus/streoptococcus  1 capsule Oral Daily   . levothyroxine  112 mcg Oral Daily at 0600   . lisinopril  5 mg Oral Daily   . magnesium oxide  200 mg Oral Daily   . simvastatin  40 mg Oral QHS   . sodium chloride (PF)  3 mL Intravenous Q8H   . warfarin  5 mg Oral Daily at 1800     Continuous Infusions:   PRN Meds:.acetaminophen, bismuth subsalicylate, ondansetron, polyethylene glycol  I personally reviewed all of  the medications        Labs:     Results    Procedure Component Value Units Date/Time    TSH [[id number]] Collected:  11/02/13 0141    Specimen Information:  Blood Updated:  11/02/13 0242     Thyroid Stimulating Hormone 0.47 uIU/mL     Prothrombin time/INR [[id number]]  (Abnormal) Collected:  11/02/13 0141    Specimen Information:  Blood Updated:  11/02/13 0227     PT 23.3 (H) sec      PT INR 1.9 (H)      PT Anticoag. Given Within 48 hrs. warfarin ([place]     Magnesium [[id number]] Collected:  11/02/13 0141    Specimen Information:  Blood Updated:  11/02/13 0219     Magnesium 2.2 mg/dL     GFR [[id number]] Collected:  11/02/13 0141     EGFR >60.0 Updated:  11/02/13 0219    Basic Metabolic Panel [[id number]] Collected:  11/02/13 0141    Specimen Information:  Blood Updated:  11/02/13 [phone]  Glucose 82 mg/dL      BUN 54.0 mg/dL      Creatinine 0.9 mg/dL      CALCIUM 9.1 mg/dL      Sodium 981 mEq/L      Potassium 4.1 mEq/L      Chloride 109 mEq/L      CO2 24 mEq/L     CBC [[id number]]  (Abnormal) Collected:  11/02/13 0141    Specimen Information:  Blood / Blood Updated:  11/02/13 0205     WBC 3.52 x10 3/uL      RBC 3.97 (L) x10 6/uL      Hgb 12.8 g/dL      Hematocrit 62.1 %      MCV 99.0 fL      MCH 32.2 (H) pg      MCHC 32.6 g/dL      RDW 13 %      Platelets 174 x10 3/uL      MPV 10.6 fL      Nucleated RBC 0 /100 WBC            Radiology Results (24 Hour)    ** No results found for the last 24 hours. **                   Assessment/ Plan     Patient Active Problem List   Diagnosis   . Celiac disease   . Scoliosis   . Atrial fibrillation   . Mitral valve disorder   . Disease of tricuspid valve   . Chronic airway obstruction, not elsewhere classified         # Atrial fibrillation  - tikosyn loading per cardiology/EP. Plan for DCCV today.  - continue warfarin for anticoagulation    # Hypothyroidism  - will continue synthroid  - check a TSH level    # celiac disease  - gluten free diet.    # Prophy  - on therapeutic AC  -  tolerating PO    # Dispo  - when cleared by cards/EP. Likely tomorrow.      Signed by: Antoine Poche, MD

## 2013-11-02 NOTE — Transfer of Care (Signed)
Procedure: Cardioversion   IV General Anesthesia  Cardiac Diagnostics  See: Cardiac Diagnostics Vital Signs  Pain well controlled  Hemodynamically Stable  Appropriate Mental Status  Tolerating Room Air/or low flow oxygen  N/V controlled  No obvious anesthetic complications

## 2013-11-02 NOTE — Anesthesia Preprocedure Evaluation (Addendum)
Anesthesia Evaluation    AIRWAY    Mallampati: III    TM distance: <3 FB  Neck ROM: limited  Mouth Opening:full   CARDIOVASCULAR    irregular and normal       DENTAL    no notable dental hx     PULMONARY    pulmonary exam normal and clear to auscultation     OTHER FINDINGS    Allergies:   -- Erythromycin   All Rx:  Scheduled Meds:Current Facility-Administered Medications:  bisoprolol, 5 mg, Oral, Daily  cetirizine, 5 mg, Oral, Daily  dofetilide, 250 mcg, Oral, Q12H SCH  furosemide, 20 mg, Oral, Daily  gabapentin, 100 mg, Oral, Q8H SCH  lactobacillus/streoptococcus, 1 capsule, Oral, Daily  levothyroxine, 112 mcg, Oral, Daily at 0600  lisinopril, 5 mg, Oral, Daily  magnesium oxide, 200 mg, Oral, Daily  simvastatin, 40 mg, Oral, QHS  sodium chloride (PF), 3 mL, Intravenous, Q8H  warfarin, 5 mg, Oral, Daily at 1800      Continuous Infusions:   PRN Meds:.acetaminophen, bismuth subsalicylate, ondansetron, polyethylene glycol   Problem List:  Patient Active Problem List    Chronic airway obstruction, not elsewhere classified         Date Noted: 04/28/2013      Atrial fibrillation         Date Noted: 06/29/2012      Mitral valve disorder         Date Noted: 06/29/2012      Disease of tricuspid valve         Date Noted: 06/29/2012      Celiac disease      Scoliosis      History:  Past Medical History:    Arrhythmia                                                    Shortness of breath                                           Benign heart murmur                                           AVD (aortic valve disease)                                    Celiac disease                                                Scoliosis                                                     Hyperlipidemia  Thyroid disease                                               Hypothyroidism                                                GERD (gastroesophageal reflux disease)                           Comment:occasional , asymptomatic    Anemia                                                        Disease of lung                                               Chronic obstructive pulmonary disease                         Dyspnea on exertion                                           Hypertension                                                  Orthopedic aftercare                                            Comment:scoliosis, stenosis, athritis  Past Surgical History:    APPENDECTOMY                                                   KNEE SURGERY                                                   TONSILLECTOMY AND ADENOIDECTOMY                                CATARACT EXTRACTION W/ INTRAOCULAR LENS IMPLANT                CARDIAC CATHETERIZATION  ORTHOPEDIC SURGERY                                           Labs    WBC       3.52   11/02/2013  HGB       12.8   11/02/2013  HCT       39.3   11/02/2013  PLT        174   11/02/2013  ALT         23   10/31/2013  AST         56   10/31/2013  NA         143   11/02/2013  K          4.1   11/02/2013  CL         109   11/02/2013  CO2         24   11/02/2013  CREAT      0.9   11/02/2013  BUN       19.0   11/02/2013  TSH       0.47   11/02/2013  PT        23.3   11/02/2013  INR        1.9   11/02/2013  GLU         82   11/02/2013  MG         2.2   11/02/2013      Enzo Bi, MD                    Anesthesia Plan    ASA 3     general                     intravenous induction   Detailed anesthesia plan: general IV            informed consent obtained

## 2013-11-02 NOTE — Plan of Care (Addendum)
A&Ox4. AF on tele- HR controlled. Independent. VSS. Denies pain, SOB or dizziness. C/o neck stiffness, repositioned pt. NPO since MN for cardioversion. Gave tikosyn at approx 0700 (4th dose), per Dr. Amado Coe order. Electrolytes are WNL. Will cont to monitor pt and cont with the plan of care.

## 2013-11-02 NOTE — Anesthesia Postprocedure Evaluation (Signed)
The patient is awake or easily arousable.  The patient's respirations and cardiovascular status have been evaluated and deemed stable post-op.  Post-op nausea, vomiting, and pain have been treated and reasonably controlled without respiratory or cardiovascular compromise.  Please refer to post-op PACU documentation for confirmation of attainment of normothermia and hydration status.  There were no obvious anesthetic related complications.  The patient has recovered adequately to be discharged from the PACU.

## 2013-11-02 NOTE — Plan of Care (Signed)
Problem: Safety  Goal: Patient will be free from injury during hospitalization  Outcome: Progressing  Pt completed CV today, NSR on telemetry, will receive 5th dose of tikosyn tonight. No c/o CP or SOB. Pt ambulatory in room, no difficulties, refuses bed alarm. Plan for D/C tomorrow if cleared by EP.

## 2013-11-03 ENCOUNTER — Other Ambulatory Visit: Payer: Self-pay

## 2013-11-03 LAB — ECG 12-LEAD
Atrial Rate: 63 {beats}/min
Atrial Rate: 70 {beats}/min
Atrial Rate: 68 {beats}/min
P Axis: 50 degrees
P-R Interval: 200 ms
P-R Interval: 110 ms
P-R Interval: 126 ms
Q-T Interval: 474 ms
Q-T Interval: 422 ms
Q-T Interval: 430 ms
QRS Duration: 120 ms
QRS Duration: 112 ms
QRS Duration: 118 ms
QTC Calculation (Bezet): 485 ms
QTC Calculation (Bezet): 455 ms
QTC Calculation (Bezet): 457 ms
R Axis: -56 degrees
R Axis: -47 degrees
R Axis: -52 degrees
T Axis: -1 degrees
T Axis: 54 degrees
T Axis: 55 degrees
Ventricular Rate: 63 {beats}/min
Ventricular Rate: 70 {beats}/min
Ventricular Rate: 68 {beats}/min

## 2013-11-03 LAB — CBC
Hematocrit: 38.1 % (ref 37.0–47.0)
Hgb: 12.2 g/dL (ref 12.0–16.0)
MCH: 31.9 pg (ref 28.0–32.0)
MCHC: 32 g/dL (ref 32.0–36.0)
MCV: 99.7 fL (ref 80.0–100.0)
MPV: 10.6 fL (ref 9.4–12.3)
Nucleated RBC: 0 /100 WBC (ref 0–1)
Platelets: 169 10*3/uL (ref 140–400)
RBC: 3.82 10*6/uL — ABNORMAL LOW (ref 4.20–5.40)
RDW: 14 % (ref 12–15)
WBC: 3.8 10*3/uL (ref 3.50–10.80)

## 2013-11-03 LAB — BASIC METABOLIC PANEL
BUN: 24 mg/dL — ABNORMAL HIGH (ref 7.0–19.0)
CO2: 23 mEq/L (ref 22–29)
Calcium: 8.7 mg/dL (ref 7.9–10.2)
Chloride: 108 mEq/L (ref 100–111)
Creatinine: 0.8 mg/dL (ref 0.6–1.0)
Glucose: 82 mg/dL (ref 70–100)
Potassium: 4.3 mEq/L (ref 3.5–5.1)
Sodium: 139 mEq/L (ref 136–145)

## 2013-11-03 LAB — PT AND APTT
PT INR: 2.1 — ABNORMAL HIGH (ref 0.9–1.1)
PT: 23 s — ABNORMAL HIGH (ref 12.6–15.0)
PTT: 38 s — ABNORMAL HIGH (ref 23–37)

## 2013-11-03 LAB — MAGNESIUM: Magnesium: 2.1 mg/dL (ref 1.6–2.6)

## 2013-11-03 LAB — GFR: EGFR: >60

## 2013-11-03 MED ORDER — DOFETILIDE 250 MCG PO CAPS
250.0000 ug | ORAL_CAPSULE | Freq: Two times a day (BID) | ORAL | Status: DC
Start: 2013-11-03 — End: 2013-12-27

## 2013-11-03 MED ORDER — DOFETILIDE 250 MCG PO CAPS
250.0000 ug | ORAL_CAPSULE | Freq: Two times a day (BID) | ORAL | 3 refills | Status: DC
Start: 2013-11-03 — End: 2013-12-27
  Filled 2013-11-03: qty 60, 30d supply, fill #0

## 2013-11-03 MED ORDER — WARFARIN SODIUM 5 MG PO TABS
5.0000 mg | ORAL_TABLET | Freq: Every day | ORAL | Status: DC
Start: 2013-11-03 — End: 2013-11-03

## 2013-11-03 NOTE — Discharge Instructions (Signed)
Discharge Instructions:     Call Your Doctor if you have:  Chest pain  Shortness of breath or difficulty breathing  Temperature greater than 100.4  Persistent nausea and vomiting  Severe uncontrolled pain  Signs of infection (pain, swelling, redness, tenderness, odor, or green/yellow discharge)  Headache or visual disturbances  Hives  Persistent dizziness or light-headedness  Extreme fatigue  Bleeding  Any other questions or concerns you may have after discharge    In an emergency, call 911 or go to an Emergency Department at a nearby hospital      Additional Instructions:  -Schedule a follow up appointment with your Primary Care Provider within 1 week of discharge.     -Carry a list of your medications and allergies with you at all times    -Resume your usual diet unless otherwise directed. Good nutrition promotes healing.    -Call your pharmacy at least 1 week in advance to refill prescriptions      Information on Taking Medicine Safely  Medicine is given to help treat or prevent illness. But if you don't take it correctly, it might not help. It might even harm you. Your doctor or pharmacist can help you learn the right way to take your medicine. Listed below are some tips to help you take medicine safely.    Safety Tips  Have a routine for taking each medicine. Make it part of something you do each day, such as brushing your teeth or eating a meal.  When you go to the hospital or your doctor's office, bring all your current medicines in their original boxes or bottles. If you can't do that, bring an up-to-date list of your medicines.  Do not stop taking a prescription medicine unless your doctor tells you to. Doing so could make your condition worse.  Do not share medicines.  Let your doctor and pharmacist know of any allergies you have.  Taking prescription medicines with alcohol, street drugs, herbs, supplements, or even some over-the-counter medicines can be harmful. Talk to your doctor or pharmacist before  using any of these things while taking a prescription medcine.  When filling your prescriptions, try using the same pharmacy for all your medicines. If not, let the pharmacist know what medicines you are already taking.  Keep medicines out of the reach of children and pets. Store medicines in a cool, dry, dark place--not in the bathroom or in the kitchen near moisture or heat.  Do not use medicine that has expired or that doesn't look or smell right. Bring expired or old medicine to your pharmacy for disposal.    Using Generic Medicines  Medicines have brand names and generic (chemical) names. When a medicine is first made, it is sold only under its brand name. Later, it can be made and sold as a generic. Generic medicines cost less than brand-name medicines and most work just as well. Unless their doctor says otherwise, most people can use the generic medicine instead of the brand-name medicine.  Always follow your healthcare professional's instructions.      Your Internal Medicine/Hospitalist Providers:   [facility] : [phone]

## 2013-11-03 NOTE — Plan of Care (Signed)
Pt ready for discharge, received tikosyn teaching, has 1 week supply and prescription she prefers to fill at own pharmacy. No c/o CP or SOB, cleared by EP. IV access removed, pt has all personal belongings. Awaiting ride from family member.

## 2013-11-03 NOTE — Progress Notes (Addendum)
[facility]   PROGRESS NOTE     [facility]       Date Time: 11/03/2013 8:30 AM Patient Name: [name], [name]  Medical Record #:  [id number] Account#:  [id number] Admission Date:  10/31/2013      Assessment:    Atrial fibrillation, now in NSR after tikosyn and cardioversion   Biatrial enlargement   Severe mitral regurgitation   Coumadin anticoagulation with INR slightly below therapeutic (usual coumadin dose 5 mg daily and 7.5 on Wednesday    Recommendations    Check ECG this morning after the 7am administration of Tikosyn . EKG is currently pending. If unremarkable, will go home this morning on Tikosyn BID of Tikosyn.   Continue Coumadin with INR goals 2-3. INR today is 2.1; can discontinue Lovenox as she is therapeutic   F/U in office in 4 weeks. We will arrange    Addendum from PA: EKG this morning QTC is within normal limits, No significant changes. Patient can be discharged this morning. SHe has an appt with [facility] next Thurs. SHe takes Coumadin 5mg  daily except for Weds she takes 7.5mg . She will go on this regimen. SHe will see Dr. Michelle Piper in the office in 2-3 weeks and then follow with EP after that as well. We will arrange the appt.  Patient Active Problem List   Diagnosis   . Celiac disease   . Scoliosis   . Atrial fibrillation   . Mitral valve disorder   . Disease of tricuspid valve   . Chronic airway obstruction, not elsewhere classified         Medications:      Scheduled Meds:        bisoprolol 5 mg Oral Daily   cetirizine 5 mg Oral Daily   dofetilide 250 mcg Oral Q12H SCH   furosemide 20 mg Oral Daily   gabapentin 100 mg Oral Q8H SCH   lactobacillus/streoptococcus 1 capsule Oral Daily   levothyroxine 112 mcg Oral Daily at 0600   lisinopril 5 mg Oral Daily   magnesium oxide 200 mg Oral Daily   simvastatin 40 mg Oral QHS   sodium chloride (PF) 3 mL Intravenous Q8H            Subjective:   Feels well overall, no clear side effect from  [place]    Physical Exam:     VITAL SIGNS PHYSICAL EXAM   Temp:  [97.5 F (36.4 C)-98 F (36.7 C)] 98 F (36.7 C)  Heart Rate:  [48-76] 62  Resp Rate:  [12-28] 18  BP: (93-124)/(51-81) 124/74 mmHg  FiO2:  [20.9 %-21 %] 21 %  Temp (24hrs), Avg:97.7 F (36.5 C), Min:97.5 F (36.4 C), Max:98 F (36.7 C)      Telemetry: Reviewed - NSR    No intake or output data in the 24 hours ending 11/03/13 0830 Physical Exam  General: awake, alert, breathing comfortable, no acute distress  Head: normocephalic  Eyes: EOM's intact  Cardiovascular: regular rate and rhythm, S1, S2, no S3, no S4, no murmurs, rubs or gallops  Neck: no carotid bruits or JVD  Lungs: clear to auscultation bilaterally, without wheezing, rhonchi, or rales  Abdomen: soft, non-tender, non-distended; no palpable masses,  normoactive bowel sounds  Extremities: no edema  Pulse: equal pulses, 4/4 symmetric  Neurological: Alert and oriented X3, mood and affect normal  Musculoskeletal:  Good strength and tone     Tele shows NSR with PACs    Labs:  Recent Labs  Lab 11/03/13  0434   Magnesium 2.1       Recent Labs  Lab 11/03/13  0434   PT 23.0*   PT INR 2.1*   PTT 38*       Recent Labs  Lab 11/03/13  0434 11/02/13  0141 11/01/13  0404   WBC 3.80 3.52 3.23*   Hgb 12.2 12.8 11.7*   Hematocrit 38.1 39.3 37.3   Platelets 169 174 151       Recent Labs  Lab 11/03/13  0434 11/02/13  0141 11/01/13  0404   Sodium 139 143 141   Potassium 4.3 4.1 4.2   Chloride 108 109 110   CO2 23 24 23    BUN 24.0* 19.0 23.0*   Creatinine 0.8 0.9 0.9   EGFR >60.0 >60.0 >60.0   Glucose 82 82 85   CALCIUM 8.7 9.1 8.8       Recent Labs  Lab 11/02/13  0141   Thyroid Stimulating Hormone 0.47        Estimated Creatinine Clearance: 51.3 mL/min (based on Cr of 0.8).      Signed by     Dreama Saa, PA    [facility]  [facility]    Arrhythmia Spectralink [phone] (8am-4:30pm)  After hours, non urgent consult line [phone]  After Hours, urgent consults [phone]  For EP  questions or consults during the week, easiest to call Spectralink [phone]   You can also reach [place] 24/7 through our main EP office [phone].

## 2013-11-03 NOTE — Plan of Care (Signed)
Pt discharge education completed. Pt reports that her coumadin is managed by [facility] and she has PT/INR blood work appointment on Thursday. Pt agrees to get her Tikosyn prescription filled at [facility] as her home pharmacy its a special order which may take a week or more.    Jorene Guest, BS, MS, RN, MSN  Patient Care Navigator, [place]  [phone]

## 2013-11-03 NOTE — Plan of Care (Signed)
Patient has been in sinus rhythm with PAC's, vitals have been stable, adjusted QTc was 448 after receiving 5th dose of tikosyn, next dose to be given at 0700. Ambulating in room independently, fall mats are in place, refusing bed alarm.

## 2013-11-03 NOTE — Discharge Summary (Signed)
[facility], [facility]  Answering service [phone]  Discharge Summary      Date Time: 11/03/2013 9:50 AM  Patient Name: [name],[name]  Attending Physician: Antoine Poche, *  Primary Care Physician: Danford Bad, MD    Date of Admission:   10/31/2013    Date of Discharge:   11/03/13    Discharge Dx:     Patient Active Problem List    Diagnosis Date Noted   . Chronic airway obstruction, not elsewhere classified 04/28/2013   . Atrial fibrillation 06/29/2012   . Mitral valve disorder 06/29/2012   . Disease of tricuspid valve 06/29/2012   . Celiac disease    . Scoliosis         Consultations:   Treatment Team: Attending Provider: Antoine Poche, MD; Registered Nurse: Julieta Bellini, RN; Case Manager: Baltazar Apo     Procedures/Radiology performed:   DCCV: atrial fibrillation to NSR   Results    Procedure Component Value Units Date/Time    Magnesium [[id number]] Collected:  11/03/13 0434    Specimen Information:  Blood Updated:  11/03/13 0526     Magnesium 2.1 mg/dL     GFR [[id number]] Collected:  11/03/13 0434     EGFR >60.0 Updated:  11/03/13 0526    Basic Metabolic Panel [[id number]]  (Abnormal) Collected:  11/03/13 0434    Specimen Information:  Blood Updated:  11/03/13 0526     Glucose 82 mg/dL      BUN 29.5 (H) mg/dL      Creatinine 0.8 mg/dL      CALCIUM 8.7 mg/dL      Sodium 284 mEq/L      Potassium 4.3 mEq/L      Chloride 108 mEq/L      CO2 23 mEq/L     PT AND APTT [[id number]]  (Abnormal) Collected:  11/03/13 0434     PT 23.0 (H) sec Updated:  11/03/13 0518     PT INR 2.1 (H)      PT Anticoag. Given Within 48 hrs. warfarin (Couma      PTT 38 (H) sec     CBC [[id number]]  (Abnormal) Collected:  11/03/13 0434    Specimen Information:  Blood / Blood Updated:  11/03/13 0515     WBC 3.80 x10 3/uL      RBC 3.82 (L) x10 6/uL      Hgb 12.2 g/dL      Hematocrit 34.7 %      MCV 99.7 fL      MCH 31.9 pg      MCHC 32.0 g/dL      RDW 14 %      Platelets 169 x10 3/uL      MPV 10.6  fL      Nucleated RBC 0 /100 WBC             Hospital Course:   Mrs. Mink is 80 year old woman with a history of symptomatic atrial fibrillation admitted for tikosyn loading. She was started on Tikosyn and did not convert after first few doses. As a result she underwent a DCCV which put her in NSR which she remains in currently. At this point she is stable for discharge and has been cleared for [facility] by EP/Cardiology with OP EP/Cardiology follow up. She will continue taking coumadin for [facility].    DISCHARGE DAY EXAM:  Filed Vitals:    11/03/13 0739   BP: 124/74   Pulse: 62   Temp:  98 F (36.7 C)   Resp: 18   SpO2: 97%     General: awake, alert, oriented x 3; no acute distress.  Cardiovascular: regular rate and rhythm, no murmurs, rubs or gallops  Lungs: clear to auscultation bilaterally, without wheezing, rhonchi, or rales  Abdomen: soft, non-tender, non-distended; no palpable masses, no hepatosplenomegaly, normoactive bowel sounds, no rebound or guarding  Extremities: no clubbing, cyanosis, or edema      Discharge Medications:     Current Discharge Medication List      START taking these medications    Details   dofetilide (TIKOSYN) 250 MCG capsule Take 1 capsule (250 mcg total) by mouth every 12 (twelve) hours.  Refills: 0         CONTINUE these medications which have NOT CHANGED    Details   acetaminophen (TYLENOL) 500 MG tablet Take 500 mg by mouth 3 (three) times daily.      alendronate (FOSAMAX) 70 MG tablet Take 70 mg by mouth every 7 days. Take 1 tab by mouth every 7 days with a full glass of water on an empty stomach. Do not take anything else by mouth or lie down for 30 mins.      bisoprolol (ZEBETA) 5 MG tablet Take 5 mg by mouth daily.        Calcium Carbonate-Vit D-Min (CALCIUM 600 + MINERALS) 600-200 MG-UNIT TABS Take by mouth. Take as directed       Coenzyme Q10 (COQ10) 100 MG CAPS Take 2 tablets by mouth daily.         furosemide (LASIX) 20 MG tablet Take 20 mg by mouth daily.      gabapentin (NEURONTIN)  100 MG capsule Take 100 mg by mouth 3 (three) times daily.        Iron-Vitamin C (VITRON-C PO) Take 1 tablet by mouth twice a week.      levothyroxine (SYNTHROID, LEVOTHROID) 112 MCG tablet Take 112 mcg by mouth daily.        lisinopril (PRINIVIL,ZESTRIL) 5 MG tablet Take 5 mg by mouth daily.        loratadine (CLARITIN) 10 MG tablet Take 10 mg by mouth daily.        Magnesium 250 MG TABS Take 1 tablet by mouth daily.      Multiple Vitamin (MULTI-VITAMINS PO) Take 1 tablet by mouth.      Probiotic Product (ALIGN PO) Take 1 tablet by mouth daily.      simvastatin (ZOCOR) 40 MG tablet Take 40 mg by mouth nightly.        warfarin (COUMADIN) 5 MG tablet Take 5 mg by mouth daily.               Discharge Instructions:   Discharge Instructions:     Call Your Doctor if you have:  Chest pain  Shortness of breath or difficulty breathing  Temperature greater than 100.4  Persistent nausea and vomiting  Severe uncontrolled pain  Signs of infection (pain, swelling, redness, tenderness, odor, or green/yellow discharge)  Headache or visual disturbances  Hives  Persistent dizziness or light-headedness  Extreme fatigue  Bleeding  Any other questions or concerns you may have after discharge    In an emergency, call 911 or go to an Emergency Department at a nearby hospital      Additional Instructions:  -Schedule a follow up appointment with your Primary Care Provider within 1 week of discharge.     -Carry a list of your medications and allergies  with you at all times    -Resume your usual diet unless otherwise directed. Good nutrition promotes healing.    -Call your pharmacy at least 1 week in advance to refill prescriptions      Information on Taking Medicine Safely  Medicine is given to help treat or prevent illness. But if you don't take it correctly, it might not help. It might even harm you. Your doctor or pharmacist can help you learn the right way to take your medicine. Listed below are some tips to help you take medicine  safely.    Safety Tips  Have a routine for taking each medicine. Make it part of something you do each day, such as brushing your teeth or eating a meal.  When you go to the hospital or your doctor's office, bring all your current medicines in their original boxes or bottles. If you can't do that, bring an up-to-date list of your medicines.  Do not stop taking a prescription medicine unless your doctor tells you to. Doing so could make your condition worse.  Do not share medicines.  Let your doctor and pharmacist know of any allergies you have.  Taking prescription medicines with alcohol, street drugs, herbs, supplements, or even some over-the-counter medicines can be harmful. Talk to your doctor or pharmacist before using any of these things while taking a prescription medcine.  When filling your prescriptions, try using the same pharmacy for all your medicines. If not, let the pharmacist know what medicines you are already taking.  Keep medicines out of the reach of children and pets. Store medicines in a cool, dry, dark place--not in the bathroom or in the kitchen near moisture or heat.  Do not use medicine that has expired or that doesn't look or smell right. Bring expired or old medicine to your pharmacy for disposal.    Using Generic Medicines  Medicines have brand names and generic (chemical) names. When a medicine is first made, it is sold only under its brand name. Later, it can be made and sold as a generic. Generic medicines cost less than brand-name medicines and most work just as well. Unless their doctor says otherwise, most people can use the generic medicine instead of the brand-name medicine.  Always follow your healthcare professional's instructions.      Your Internal Medicine/Hospitalist Providers:   [facility] : [phone]      Disposition:  home    Patient was instructed to follow up with Primary Care Doctor Danford Bad, MD in 2 weeks and with cardiology in 2  weeks    Minutes spent coordinating discharge and reviewing discharge plan:More 30 minutes          Signed by: Antoine Poche, MD    CC: Danford Bad, MD

## 2013-11-03 NOTE — Progress Notes (Signed)
Pt discharged safely off unit per wheelchair.

## 2013-12-15 ENCOUNTER — Other Ambulatory Visit: Payer: Self-pay | Admitting: Cardiovascular Disease

## 2013-12-15 DIAGNOSIS — I4891 Unspecified atrial fibrillation: Secondary | ICD-10-CM

## 2013-12-28 ENCOUNTER — Ambulatory Visit: Payer: Medicare Other | Admitting: Anesthesiology

## 2013-12-28 ENCOUNTER — Ambulatory Visit
Admission: RE | Admit: 2013-12-28 | Discharge: 2013-12-28 | Disposition: A | Discharge status: Home / Self Care | Payer: Medicare Other | Source: Ambulatory Visit | Attending: Cardiovascular Disease | Admitting: Cardiovascular Disease

## 2013-12-28 VITALS — BP 120/75 | HR 48 | Resp 15 | Ht 65.0 in | Wt 150.0 lb

## 2013-12-28 DIAGNOSIS — I4819 Other persistent atrial fibrillation: Secondary | ICD-10-CM

## 2013-12-28 DIAGNOSIS — I4891 Unspecified atrial fibrillation: Secondary | ICD-10-CM | POA: Insufficient documentation

## 2013-12-28 LAB — PT/INR
PT INR: 2.9 — ABNORMAL HIGH (ref 0.9–1.1)
PT: 29.5 s — ABNORMAL HIGH (ref 12.6–15.0)

## 2013-12-28 MED ORDER — PROPOFOL INFUSION 10 MG/ML
INTRAVENOUS | Status: DC | PRN
Start: 2013-12-28 — End: 2013-12-28
  Administered 2013-12-28: 70 mg via INTRAVENOUS

## 2013-12-28 MED ORDER — PROPOFOL 10 MG/ML IV EMUL
INTRAVENOUS | Status: AC
Start: 2013-12-28 — End: ?
  Filled 2013-12-28: qty 20

## 2013-12-28 MED ORDER — SODIUM CHLORIDE 0.9 % IV SOLN
INTRAVENOUS | Status: DC | PRN
Start: 2013-12-28 — End: 2013-12-28

## 2013-12-28 NOTE — Anesthesia Preprocedure Evaluation (Addendum)
Anesthesia Evaluation    AIRWAY    Mallampati: III    TM distance: <3 FB  Neck ROM: limited  Mouth Opening:full   CARDIOVASCULAR    irregular and normal       DENTAL    no notable dental hx     PULMONARY    pulmonary exam normal and clear to auscultation     OTHER FINDINGS                          Anesthesia Plan    ASA 2     general                     intravenous induction   Detailed anesthesia plan: general IV        Post op pain management: per surgeon    informed consent obtained                   ===============================================================  Inpatient Anesthesia Evaluation    Patient Name: [name],[name]  Surgeon: * Surgery not found *  Patient Age / Sex: 80 Young.o. / female    Medical History:     Past Medical History   Diagnosis Date   . Arrhythmia    . Shortness of breath    . Benign heart murmur    . AVD (aortic valve disease)    . Celiac disease    . Scoliosis    . Hyperlipidemia    . Thyroid disease    . Hypothyroidism    . GERD (gastroesophageal reflux disease)      occasional , asymptomatic   . Anemia    . Disease of lung    . Chronic obstructive pulmonary disease    . Dyspnea on exertion    . Hypertension    . Orthopedic aftercare      scoliosis, stenosis, athritis   . Encounter for cardioversion procedure 10/31/2013       Past Surgical History   Procedure Laterality Date   . Appendectomy     . Knee surgery     . Tonsillectomy and adenoidectomy     . Cataract extraction w/ intraocular lens implant     . Cardiac catheterization     . Orthopedic surgery           Allergies:     Allergies   Allergen Reactions   . Erythromycin          Medications:     No current facility-administered medications for this encounter.              Prior to Admission medications    Medication Sig Start Date End Date Taking? Authorizing Provider   acetaminophen (TYLENOL) 500 MG tablet Take 500 mg by mouth 3 (three) times daily.   Yes [provider]   alendronate (FOSAMAX) 70 MG tablet Take 70 mg by  mouth every 7 days. Take 1 tab by mouth every 7 days with a full glass of water on an empty stomach. Do not take anything else by mouth or lie down for 30 mins.   Yes [provider]   amiodarone (PACERONE) 200 MG tablet Take 200 mg by mouth daily.   Yes [provider]   bisoprolol (ZEBETA) 5 MG tablet Take 5 mg by mouth daily.     Yes [provider]   Calcium Carbonate-Vit D-Min (CALCIUM 600 + MINERALS) 600-200 MG-UNIT TABS Take by mouth.  Take as directed    Yes [provider]   Coenzyme Q10 (COQ10) 100 MG CAPS Take 2 tablets by mouth daily.      Yes [provider]   furosemide (LASIX) 20 MG tablet Take 20 mg by mouth every other day.      Yes [provider]   gabapentin (NEURONTIN) 100 MG capsule Take 100 mg by mouth 3 (three) times daily.     Yes [provider]   Iron-Vitamin C (VITRON-C PO) Take 1 tablet by mouth twice a week.   Yes [provider]   levothyroxine (SYNTHROID, LEVOTHROID) 112 MCG tablet Take 112 mcg by mouth daily.     Yes [provider]   lisinopril (PRINIVIL,ZESTRIL) 5 MG tablet Take 5 mg by mouth daily.     Yes [provider]   loratadine (CLARITIN) 10 MG tablet Take 10 mg by mouth daily.     Yes [provider]   Magnesium 250 MG TABS Take 1 tablet by mouth daily.   Yes [provider]   Multiple Vitamin (MULTI-VITAMINS PO) Take 1 tablet by mouth.   Yes [provider]   Probiotic Product (ALIGN PO) Take 1 tablet by mouth daily.   Yes [provider]   simvastatin (ZOCOR) 40 MG tablet Take 40 mg by mouth nightly.     Yes [provider]   warfarin (COUMADIN) 5 MG tablet Take 5 mg by mouth daily. 5 mg on Mondays and Fridays, 2.5 mg the rest of the week      [provider]     Vitals        Wt Readings from Last 3 Encounters:   12/27/13 68.04 kg (150 lb)   11/03/13 66.588 kg (146 lb 12.8 oz)   06/19/13 69.673 kg (153 lb 9.6 oz)     BMI  (Estimated body mass index is 24.96 kg/(m^2) as calculated from the following:    Height as of this encounter: 1.651 m (5\' 5" ).    Weight as of this encounter: 68.04 kg (150 lb).)  Temp Readings from Last 3 Encounters:   11/03/13 36.7 C (98 F)    09/08/12 36.4 C (97.5 F)      BP Readings from Last 3 Encounters:   11/03/13 124/74   06/19/13 122/78   04/28/13 140/80     Pulse Readings from Last 3 Encounters:   11/03/13 62   06/19/13 72   04/28/13 98           Labs:   CBC:  Lab Results   Component Value Date    WBC 3.80 11/03/2013    HGB 12.2 11/03/2013    HCT 38.1 11/03/2013    PLT 169 11/03/2013       Chemistries:  Lab Results   Component Value Date    NA 139 11/03/2013    K 4.3 11/03/2013    CL 108 11/03/2013    CO2 23 11/03/2013    BUN 24.0* 11/03/2013    CREAT 0.8 11/03/2013    GLU 82 11/03/2013    CA 8.7 11/03/2013    AST 56* 10/31/2013       Coags:  Lab Results   Component Value Date    PT 23.0* 11/03/2013    PTT 38* 11/03/2013    INR 2.1* 11/03/2013     _____________________      Signed by: Comer Locket  12/28/2013   7:37 AM    =============================================================  [facility]    PROGRESS NOTE      [facility]       Date Time: 11/03/2013 8:30 AM Patient Name: [name], [name]  Medical Record #:  [id number] Account#: [id number] Admission Date:  10/31/2013       Assessment:     Atrial fibrillation, now in NSR after tikosyn and cardioversion   Biatrial enlargement   Severe mitral regurgitation   Coumadin anticoagulation with INR slightly below therapeutic (usual coumadin dose 5 mg daily and 7.5 on Wednesday      Recommendations     Check ECG this morning after the 7am administration of Tikosyn . EKG is currently pending. If unremarkable, will go home this morning on Tikosyn BID of Tikosyn.   Continue Coumadin with INR goals 2-3. INR today is 2.1; can discontinue Lovenox as she is therapeutic   F/U in office in 4 weeks. We will  arrange    Addendum from PA: EKG this morning QTC is within normal limits, No significant changes. Patient can be discharged this morning. SHe has an appt with the COumadin clinic next Thurs. SHe takes Coumadin 5mg  daily except for Weds she takes 7.5mg . She will go on this regimen. SHe will see Dr. Michelle Piper in the office in 2-3 weeks and then follow with EP after that as well. We will arrange the appt.  Patient Active Problem List    Diagnosis    .  Celiac disease    .  Scoliosis    .  Atrial fibrillation    .  Mitral valve disorder    .  Disease of tricuspid valve    .  Chronic airway obstruction, not elsewhere classified      ===========================================================    INDICATION:  Ms. Cudney is a pleasant 80 year old woman with moderate to severe mitral  valve regurgitation and moderate to severe tricuspid valve regurgitation.    She has symptoms of dyspnea primarily with exertion.  She has had no chest  pain.  She underwent a TEE in January of 2014, at which point, the  transesophageal echocardiogram showed moderate to severe mitral valve  regurgitation with severe dilatation of the right atrium.  The patient also  had a stress echocardiogram to assess if her symptoms may be related to  worsening pulmonary hypertension on exertion.  At rest, her PA systolic  pressure was 57, post-exercise it increased to 87.  The patient was only  able to achieve 2.9 METS on the modified Bruce and 6.30 minutes.  Given all  this, the patient was referred for cardiac catheterization, right and left  heart catheterization to assess her pulmonary pressures and also for  preoperative evaluation prior to possible mitral valve surgery.    TITLE OF PROCEDURES:  1.  Right heart catheterization.  2.  Coronary angiogram.  3.  Left ventricular angiogram.    DESCRIPTION OF PROCEDURE:  Informed consent was obtained.  The patient was brought into the  catheterization lab in a stable condition.  The right femoral region  and  the right radial region were draped and prepped in a sterile manner.    Lidocaine 1% was given in both sides.  Conscious sedation was given using  Versed and fentanyl.  A 7-French sheath was inserted into the right femoral  vein and a 5-French sheath was inserted into the right femoral artery using  the modified Seldinger technique,  following which a Swan-Ganz catheter was  advanced through the femoral sheath into the right  atrium, right ventricle,  PA and wedge position.  Following which, cardiac output was obtained using  thermodilution method.  The catheter was then removed.  Following which, a  5-French TIG catheter was advanced through the right radial sheath into the  left coronary system.  Multiple projections were obtained.  The catheter  was then removed and was used to engage the right coronary artery.    Multiple projections were obtained.  The catheter was then removed and  exchanged for a pigtail catheter, which was advanced into the left  ventricle.  LVEDP was measured.  LV angiogram was performed and a pullback  gradient was obtained.  At the end of the procedure, the sheath in the  right femoral vein was removed and manual hemostasis was achieved.  The  sheath in the right radial artery was removed and a vascular band was  placed.  There is no complication.  The patient was taken to recovery.    FINDINGS:  I.  HEMODYNAMICS:  1.  Mean wedge of 12, A wave of 12, V wave of 16.  No significant prominent  V waves noted.  PA pressure 37/17/24 consistent with mild pulmonary  hypertension.  2.  Mean right atrial pressure 7 mmHg.  3.  LVEDP 13 mmHg.  4.  RV 37/36 mmHg.  5.  Cardiac output using thermodilution was 4.23 liters per minute, cardiac  index 2.4 liters per minute per meter squared.    CORONARY ANGIOGRAPHY:  1.  Left main appears normal.  2.  Left circumflex is a medium size vessel with 1 major OM, which  bifurcates distally.  There is mild diffuse disease.  3.  Left anterior descending artery  is a medium to small size vessel  distally.  It gives off a large diagonal branch, which has mild diffuse  disease.  4.  Right coronary is a large vessel and dominant.  There is approximately  20% stenosis in the proximal segment of this vessel.    LEFT VENTRICULOGRAM:  Demonstrated normal LV function.  There appears to be mild diffuse  hypokinesis with an LV function estimated at 45%.  There was no aortic  valve stenosis by pullback.  Severe mitral regurgitation was not noted on  the ventricular angiogram.    CONCLUSION:  1.  Mild pulmonary hypertension with normal wedge pressure and elevated  pulmonary vascular resistance of approximately 3 Wood units.  2.  Normal cardiac output.  3.  No significant obstructive coronary artery disease.  4.  Mild LV dysfunction, LV function estimated at 45% to 50%.  No severe  mitral valve regurgitation noted on LV angiogram today, possibly mild to moderate  the left ventricular angiogram.    RECOMMENDATIONS:  I would recommend a pulmonary evaluation for other causes of dyspnea in  this patient.  We will give the patient a very low-dose of Lasix to assess  for any improvement despite the fact that her filling pressures were  essentially normal.  We will follow her up closely with close electrolytes  check as well.

## 2013-12-28 NOTE — Discharge Instructions (Signed)
Atrial Fibrillation    "Atrial Fibrillation" is a condition where the heart beats in an irregular pattern. It is due to a disturbance in the electrical pathways of the heart. It is a sign of heart disease or other health problem affecting the heart.  The most common symptom is "palpitations". This is the feeling that your heart is fluttering, beating fast, hard or irregular. When the heart beats too fast it does not pump blood very well. This can cause other symptoms such as anxiety, fatigue, shortness of breath, chest pain, dizziness or fainting. Atrial Fibrillation may come and go, lasting from a few hours to a couple of days. Or, it may become chronic, lasting for months at a time, or longer.  Atrial Fibrillation may be caused by a disease of the heart or other conditions in the body that affect the heart:  1. Coronary artery disease (atherosclerosis)  2. High blood pressure  3. Disease of the heart valves  4. Enlarged heart  Atrial Fibrillation can also occur without heart disease due to:   Overactive thyroid (hyperthyroid)   Chronic lung disease (COPD, emphysema, bronchitis)   Heavy alcohol use   Cardiac stimulants (cocaine, amphetamines, diet pills, certain decongestant cold medicines, caffeine or nicotine)   Infection   Blood clot in the lung (pulmonary embolus)  Treating or removing these causes will improve success in the treatment of Atrial Fibrillation and reduce your risk of recurrence.  Atrial Fibrillation can alternate back and forth with another abnormal rhythm called Atrial Flutter . The risk of stroke increases with either of these conditions. Proper treatment can reduce your risk of stroke.  Home Care:   Resume your usual activities as soon as you are feeling back to normal.   If you smoke, stop smoking. Contact your doctor or a local stop-smoking program for help.   Avoid cardiac stimulants (cocaine, amphetamines, diet pills, certain decongestant cold medicines, caffeine or  nicotine).   If medicine is prescribed to prevent recurrence of Atrial Fibrillation, take it exactly as directed. Some medicines must be taken daily, not just when you have symptoms, in order to be effective.   If you were prescribed warfarin (Coumadin) to reduce stroke risk, have your blood tested on a regular basis as advised by your doctor. This will ensure you are getting the dose that is right for you and reduce the risk of side effects.  Follow Up  with your doctor as advised by our staff.  Get Prompt Medical Attention  if any of the following occur:   Increasing shortness of breath or swelling in the legs   Unexpected weight gain   Chest pain or palpitations (the sense that your heart is fluttering, beating fast or hard)   Fever of 100.71F (38C) or higher, or as directed by your healthcare provider   Cough with dark colored or bloody sputum (mucus)   Pain, redness or swelling in one leg   Signs of stroke:   Weakness of an arm or leg or one side of the face   Difficulty with speech or vision   Extreme drowsiness, confusion, dizziness or fainting   2000-2014 [employer] [address], [place], [place] [address]. All rights reserved. This information is not intended as a substitute for professional medical care. Always follow your healthcare professional's instructions.      Post Anesthesia Discharge Instructions    Although you may be awake and alert in the recovery room, small amounts of anesthetic remain in  your system for about 24 hours.  You may feel tired and sleepy during this time.      You are advised to go directly home from the hospital.    Plan to stay at home and rest for the remainder of the day.    It is advisable to have someone with you at home for 24 hours after surgery.    Do not operate a motor vehicle, or any mechanical or electrical equipment for the next 24 hours.      Be careful when you are walking around, you may become dizzy.  The effects of anesthesia  and/or medications are still present and drowsiness may occur    Do not consume alcohol, tranquilizers, sleeping medications, or any other non prescribed medication for the remainder of the day.    Diet:  begin with liquids, progress your diet as tolerated or as directed by your surgeon.  Nausea and vomiting may occur in the next 24 hours.    Cardioversion [employer])    You had a cardioversion today. This is an electrical shock applied to the chest toreset your heart rhythmback to normal. Your chest wall and chest muscles may feel sore for a few days. A bruise may appear on the chest, but that will go away within a week.  To prepare you for this procedure, you may have received medicine to sedate you and reduce pain. Depending on the medicine used,it could take up to8 hours for the effect to wear off.  Home Care:  5. For the next 8 hours, you should be watched by a responsible adult to look for any worsening of your condition.  6. Do not take any oral medicine for pain or sleep during the next 4 hours since this might react with the medicines you were given in the hospital, causing a much stronger response than usual.  7. Do not drinkanyalcohol for the next 24 hours.  8. Do notdriveor operate dangerous machinery during the next 24 hours.  9. Medicines may be prescribed to prevent a recurrence of the abnormal heart rhythm. Take these as directed.  10. You may use acetaminophen (Tylenol) or ibuprofen (Motrin, Advil) to control pain, unless another pain medicine was prescribed. [NOTE: If you have chronic liver or kidney disease or ever had a stomach ulcer or GI bleeding, talk with your doctor before using these medicines.]  Follow Up  with your doctor or this facility if you are not alert and back to your usual level of activity within 12 hours.  Get Prompt Medical Attention  if any of the following occur:   Weakness, dizziness, light-headed or fainting   Chest, arm, shoulder, neck or upper back  pain   Shortness of breath   Rapid heart rate (over 120 beats per minute, at rest)   Palpitations (the sense that your heart is fluttering or beating fast or hard or irregular)   Difficulty with speech or vision, weakness of an arm or leg   2000-2014 [employer] [address] [place], [place] [address]. All rights reserved. This information is not intended as a substitute for professional medical care. Always follow your healthcare professional's instructions.

## 2013-12-28 NOTE — Procedures (Signed)
[name] is a 80 y.o. female patient.  Active Problems:    * No active hospital problems. *    Past Medical History   Diagnosis Date   . Arrhythmia    . Shortness of breath    . Benign heart murmur    . AVD (aortic valve disease)    . Celiac disease    . Scoliosis    . Hyperlipidemia    . Thyroid disease    . Hypothyroidism    . GERD (gastroesophageal reflux disease)      occasional , asymptomatic   . Anemia    . Disease of lung    . Chronic obstructive pulmonary disease    . Dyspnea on exertion    . Hypertension    . Orthopedic aftercare      scoliosis, stenosis, athritis   . Encounter for cardioversion procedure 10/31/2013     Blood pressure 107/53, pulse 41, resp. rate 14, height 1.651 m (5\' 5" ), weight 68.04 kg (150 lb), SpO2 99 %.    Electrical Cardioversion  Date/Time: 12/28/2013 9:32 AM  Performed by: Ida Rogue  Authorized by: Ida Rogue  Consent: Verbal consent obtained. Written consent obtained.  Risks and benefits: risks, benefits and alternatives were discussed  Consent given by: patient  Patient understanding: patient states understanding of the procedure being performed  Patient consent: the patient's understanding of the procedure matches consent given  Procedure consent: procedure consent matches procedure scheduled  Relevant documents: relevant documents present and verified  Test results: test results available and properly labeled  Site marked: the operative site was not marked  Imaging studies: imaging studies not available  Required items: required blood products, implants, devices, and special equipment available  Patient identity confirmed: verbally with patient  Time out: Immediately prior to procedure a "time out" was called to verify the correct patient, procedure, equipment, support staff and site/side marked as required.  Patient sedated: yes  Sedatives: see MAR for details  Vitals: Vital signs were monitored during sedation.  Cardioversion basis: elective  Indications: failure of  anti-arrhythmic medications  Pre-procedure rhythm: atrial fibrillation  Patient position: patient was placed in a supine position  Chest area: chest area exposed  Electrodes: pads  Electrodes placed: anterior-posterior  Number of attempts: 1  Attempt 1 mode: synchronous  Attempt 1 waveform: biphasic  Attempt 1 shock (in Joules): 200  Attempt 1 outcome: conversion to normal sinus rhythm  Post-procedure rhythm: normal sinus rhythm  Complications: no complications  Patient tolerance: Patient tolerated the procedure well with no immediate complications  Comments: Patient had sinus bradycardia after procedure        Ida Rogue  12/28/2013

## 2013-12-28 NOTE — Progress Notes (Signed)
Pt tolerated procedure well.  Vitals signs stable and back to baseline. Pt denies pain, sob and nausea.  Dr. Loraine Leriche explained procedure and results to patient and spouse who verbalized understanding.  Patient taken  via wheelchair by [facility] to [facility] lobby with belongings in tow to meet family for transport home.

## 2013-12-28 NOTE — Anesthesia Postprocedure Evaluation (Signed)
Anesthesia Post Evaluation    Patient: [name]    Last vitals:   Filed Vitals:    12/28/13 0955   BP: 120/75   Pulse: 48   Resp: 15   SpO2: 94%       The patient is awake or easily arousable.      The patients respirations, and cardiovascular status have been evaluated and deemed stable post op.     Post op nausea, vomiting and pain have been treated and controlled as effectively as possible without compromising the patients respiratory and cardiovascular status.    Please refer to Post Op PACU Documentation for confirmation of attainment of normothermia and adequate hydration status.    There were no obvious anesthetic related complications.    The patient has recovered adequately to be transfered to floor at the present time.

## 2013-12-28 NOTE — Transfer of Care (Signed)
Anesthesia Transfer of Care Note    Patient: [name]    Procedures performed: DCCV    Anesthesia type: General TIVA    Patient location:Phase I PACU    Last vitals: There were no vitals filed for this visit.    Post pain: Patient not complaining of pain, continue current therapy      Mental Status:sedated    Respiratory Function: tolerating face mask    Cardiovascular: stable    Nausea/Vomiting: patient not complaining of nausea or vomiting    Hydration Status: adequate    Post assessment: no apparent anesthetic complications, no reportable events and no evidence of recall

## 2014-01-03 LAB — ECG 12-LEAD
Atrial Rate: 78 {beats}/min
Atrial Rate: 44 {beats}/min
P Axis: 82 degrees
P-R Interval: 230 ms
Q-T Interval: 408 ms
Q-T Interval: 472 ms
QRS Duration: 124 ms
QRS Duration: 128 ms
QTC Calculation (Bezet): 452 ms
QTC Calculation (Bezet): 403 ms
R Axis: -53 degrees
R Axis: -54 degrees
T Axis: 84 degrees
T Axis: 76 degrees
Ventricular Rate: 74 {beats}/min
Ventricular Rate: 44 {beats}/min

## 2014-06-22 HISTORY — PX: OTHER SURGICAL HISTORY: SHX169

## 2014-07-05 ENCOUNTER — Ambulatory Visit (INDEPENDENT_AMBULATORY_CARE_PROVIDER_SITE_OTHER): Payer: Self-pay

## 2014-07-17 ENCOUNTER — Ambulatory Visit (INDEPENDENT_AMBULATORY_CARE_PROVIDER_SITE_OTHER): Payer: Self-pay | Admitting: Cardiovascular Disease

## 2014-07-17 ENCOUNTER — Ambulatory Visit (INDEPENDENT_AMBULATORY_CARE_PROVIDER_SITE_OTHER): Payer: Self-pay

## 2014-07-17 ENCOUNTER — Other Ambulatory Visit (INDEPENDENT_AMBULATORY_CARE_PROVIDER_SITE_OTHER): Payer: Self-pay

## 2014-07-19 ENCOUNTER — Other Ambulatory Visit: Payer: Self-pay | Admitting: Cardiovascular Disease

## 2014-07-19 DIAGNOSIS — I340 Nonrheumatic mitral (valve) insufficiency: Secondary | ICD-10-CM

## 2014-07-27 ENCOUNTER — Ambulatory Visit
Admission: RE | Admit: 2014-07-27 | Discharge: 2014-07-27 | Disposition: A | Discharge status: Home / Self Care | Payer: Medicare Other | Source: Ambulatory Visit | Attending: Cardiovascular Disease | Admitting: Cardiovascular Disease

## 2014-07-27 DIAGNOSIS — I080 Rheumatic disorders of both mitral and aortic valves: Secondary | ICD-10-CM | POA: Insufficient documentation

## 2014-07-27 DIAGNOSIS — I340 Nonrheumatic mitral (valve) insufficiency: Secondary | ICD-10-CM

## 2014-07-27 DIAGNOSIS — R0602 Shortness of breath: Secondary | ICD-10-CM | POA: Insufficient documentation

## 2014-07-27 DIAGNOSIS — I4891 Unspecified atrial fibrillation: Secondary | ICD-10-CM | POA: Insufficient documentation

## 2014-07-30 ENCOUNTER — Other Ambulatory Visit: Payer: Self-pay | Admitting: Nurse Practitioner

## 2014-07-30 DIAGNOSIS — M25561 Pain in right knee: Secondary | ICD-10-CM

## 2014-07-30 DIAGNOSIS — M1990 Unspecified osteoarthritis, unspecified site: Secondary | ICD-10-CM

## 2014-08-01 ENCOUNTER — Ambulatory Visit: Payer: Medicare Other | Attending: Nurse Practitioner

## 2014-08-01 DIAGNOSIS — M23321 Other meniscus derangements, posterior horn of medial meniscus, right knee: Secondary | ICD-10-CM | POA: Insufficient documentation

## 2014-08-01 DIAGNOSIS — M1711 Unilateral primary osteoarthritis, right knee: Secondary | ICD-10-CM | POA: Insufficient documentation

## 2014-08-01 DIAGNOSIS — M23361 Other meniscus derangements, other lateral meniscus, right knee: Secondary | ICD-10-CM | POA: Insufficient documentation

## 2014-08-01 DIAGNOSIS — M2241 Chondromalacia patellae, right knee: Secondary | ICD-10-CM | POA: Insufficient documentation

## 2014-08-01 DIAGNOSIS — M1990 Unspecified osteoarthritis, unspecified site: Secondary | ICD-10-CM

## 2014-08-01 DIAGNOSIS — M25561 Pain in right knee: Secondary | ICD-10-CM | POA: Insufficient documentation

## 2014-08-02 ENCOUNTER — Ambulatory Visit (INDEPENDENT_AMBULATORY_CARE_PROVIDER_SITE_OTHER): Payer: Self-pay

## 2014-08-10 ENCOUNTER — Ambulatory Visit (INDEPENDENT_AMBULATORY_CARE_PROVIDER_SITE_OTHER): Payer: Self-pay | Admitting: Cardiology

## 2014-08-30 ENCOUNTER — Ambulatory Visit (INDEPENDENT_AMBULATORY_CARE_PROVIDER_SITE_OTHER): Payer: Self-pay

## 2014-09-27 ENCOUNTER — Ambulatory Visit (INDEPENDENT_AMBULATORY_CARE_PROVIDER_SITE_OTHER): Payer: Self-pay

## 2014-10-30 ENCOUNTER — Ambulatory Visit (INDEPENDENT_AMBULATORY_CARE_PROVIDER_SITE_OTHER): Payer: Self-pay

## 2014-11-06 ENCOUNTER — Ambulatory Visit (INDEPENDENT_AMBULATORY_CARE_PROVIDER_SITE_OTHER): Payer: Self-pay

## 2014-11-20 ENCOUNTER — Ambulatory Visit (INDEPENDENT_AMBULATORY_CARE_PROVIDER_SITE_OTHER): Payer: Self-pay

## 2014-12-20 ENCOUNTER — Ambulatory Visit (INDEPENDENT_AMBULATORY_CARE_PROVIDER_SITE_OTHER): Payer: Self-pay

## 2015-01-11 ENCOUNTER — Ambulatory Visit (INDEPENDENT_AMBULATORY_CARE_PROVIDER_SITE_OTHER): Payer: Self-pay

## 2015-01-11 ENCOUNTER — Ambulatory Visit (INDEPENDENT_AMBULATORY_CARE_PROVIDER_SITE_OTHER): Payer: Self-pay | Admitting: Cardiovascular Disease

## 2015-01-11 ENCOUNTER — Other Ambulatory Visit (INDEPENDENT_AMBULATORY_CARE_PROVIDER_SITE_OTHER): Payer: Self-pay

## 2015-01-17 ENCOUNTER — Ambulatory Visit (INDEPENDENT_AMBULATORY_CARE_PROVIDER_SITE_OTHER): Payer: Self-pay

## 2015-01-24 ENCOUNTER — Ambulatory Visit: Payer: Medicare Other

## 2015-01-24 NOTE — Patient Instructions (Signed)
   Labs done 7/29 at [facility]   Arrival/Procedure times reviewed:  0700/0800 read back   Pt verbalized understanding of NPO instructions:  NPO after MN with sips of water to take am medications - read back   On Coumadin- Managed by Dr. Amado Coe, pt reports that she has 4 weeks of INR results.  Pt denies any missed doses in the past 4 weeks.  Reinforced with pt to continue taking per her normal routine   Pt instructed to bring current medication list day of procedure   Has limited vision in left eye

## 2015-01-24 NOTE — Patient Instructions (Signed)
   Labs done 7/30 at [facility]   Arrival/Procedure times reviewed:  0700/0800 read back   Pt verbalized understanding of NPO instructions:  NPO after MN with sips of water to take am medications - read back   On Coumadin - Managed by Dr. Amado Coe.  No missed doses in the past 4 weeks.  Reinforced with pt to continue taking per her normal routine.   Limited vision in left eye

## 2015-01-25 ENCOUNTER — Other Ambulatory Visit: Payer: Self-pay | Admitting: Cardiovascular Disease

## 2015-01-25 DIAGNOSIS — I4891 Unspecified atrial fibrillation: Secondary | ICD-10-CM

## 2015-01-25 NOTE — H&P (Addendum)
The history and physical currently available in the chart has been reviewed and there are no significant interval changes since prior evaluation.  She has no complaints.  She was seen and examined by me prior to the procedure.  The risks, benefits and alternatives of the procedure have been discussed in detail and she has indicated that she understands the procedure, indications, and risks inherent to the procedure and is amenable to proceeding.  All questions were answered.  Informed consent was signed and verified.

## 2015-01-28 ENCOUNTER — Ambulatory Visit: Payer: Medicare Other | Admitting: Anesthesiology

## 2015-01-28 ENCOUNTER — Ambulatory Visit
Admission: RE | Admit: 2015-01-28 | Discharge: 2015-01-28 | Disposition: A | Discharge status: Home / Self Care | Payer: Medicare Other | Source: Ambulatory Visit | Attending: Anesthesiology | Admitting: Anesthesiology

## 2015-01-28 ENCOUNTER — Ambulatory Visit (INDEPENDENT_AMBULATORY_CARE_PROVIDER_SITE_OTHER): Payer: Self-pay

## 2015-01-28 VITALS — BP 128/72 | HR 56 | Temp 98.1°F | Resp 16 | Ht 64.0 in | Wt 148.0 lb

## 2015-01-28 DIAGNOSIS — Z7901 Long term (current) use of anticoagulants: Secondary | ICD-10-CM | POA: Insufficient documentation

## 2015-01-28 DIAGNOSIS — I4891 Unspecified atrial fibrillation: Secondary | ICD-10-CM

## 2015-01-28 DIAGNOSIS — I481 Persistent atrial fibrillation: Secondary | ICD-10-CM | POA: Insufficient documentation

## 2015-01-28 LAB — ECG 12-LEAD
Atrial Rate: 97 {beats}/min
Atrial Rate: 57 {beats}/min
P Axis: 86 degrees
P-R Interval: 218 ms
Q-T Interval: 394 ms
Q-T Interval: 476 ms
QRS Duration: 122 ms
QRS Duration: 128 ms
QTC Calculation (Bezet): 439 ms
QTC Calculation (Bezet): 463 ms
R Axis: -46 degrees
R Axis: -54 degrees
T Axis: 88 degrees
T Axis: 75 degrees
Ventricular Rate: 75 {beats}/min
Ventricular Rate: 57 {beats}/min

## 2015-01-28 LAB — PT/INR
PT INR: 2.6 — ABNORMAL HIGH (ref 0.9–1.1)
PT: 28.5 s — ABNORMAL HIGH (ref 12.6–15.0)

## 2015-01-28 MED ORDER — PROPOFOL INFUSION 10 MG/ML
INTRAVENOUS | Status: DC | PRN
Start: 2015-01-28 — End: 2015-01-28
  Administered 2015-01-28: 60 mg via INTRAVENOUS

## 2015-01-28 MED ORDER — PROPOFOL 10 MG/ML IV EMUL (WRAP)
INTRAVENOUS | Status: AC
Start: 2015-01-28 — End: ?
  Filled 2015-01-28: qty 20

## 2015-01-28 MED ORDER — SODIUM CHLORIDE 0.9 % IV SOLN
INTRAVENOUS | Status: DC | PRN
Start: 2015-01-28 — End: 2015-01-28

## 2015-01-28 NOTE — Progress Notes (Signed)
Pt in [facility] for Synchronized Cardioversion with anesthesia. Pt AA+Ox4, MAE, speech clear, denies pain, VSS, A.fib on monitor and on 12-lead EKG. Dr. Caralee Ates and Dr Amado Coe talked to pt. Anesthesia and Procedure risks and benefits discussed. All consents signed. Will continue with procedure, see anesthesia records for intra-procedure documentation and monitoring.

## 2015-01-28 NOTE — Discharge Instructions (Signed)
Post Anesthesia Discharge Instructions    Although you may be awake and alert in the recovery room, small amounts of anesthetic remain in your system for about 24 hours.  You may feel tired and sleepy during this time.      You are advised to go directly home from the hospital.    Plan to stay at home and rest for the remainder of the day.    It is advisable to have someone with you at home for 24 hours after surgery.    Do not operate a motor vehicle, or any mechanical or electrical equipment for the next 24 hours.      Be careful when you are walking around, you may become dizzy.  The effects of anesthesia and/or medications are still present and drowsiness may occur    Do not consume alcohol, tranquilizers, sleeping medications, or any other non prescribed medication for the remainder of the day.    Diet:  begin with liquids, progress your diet as tolerated or as directed by your surgeon.  Nausea and vomiting may occur in the next 24 hours.    Electrical Cardioversion    You had a cardioversion today. This is an electrical shock applied to the chest. The shockreset your heart rhythmback to normal. Your chest wall and chest muscles may feel sore for a few days. A bruise may appear on your chest. That will go away within a week.  To get ready for this procedure, you may have been given medicine to help you relax and to reduce pain. Depending on the medicine used,it could take up to8 hours for the effect to wear off.  Home care  Follow these guidelines when caring for yourself at home:   You should be watched by a responsible adult for the next 8 hours. This is in case your condition gets worse.   Don't take any oral medicine for pain or sleep during the next 4 hours. These medicines might react with the medicines you were given in the hospital. This can cause a much stronger response than usual.   Don't drinkanyalcohol for the next 24 hours.   Don'tdriveor operate dangerous machinery during the next 24  hours.   Your health care provider may have prescribed medicines to stop the abnormal heart rhythm from coming back. Take these medicines as directed.   You may use acetaminophen or ibuprofen to control pain, unless another pain medicine was prescribed. If you have chronic liver or kidney disease, talk with your provider before taking these medicines. Also talk with your provider if you've had a stomach ulcer or GI bleeding.  Follow-up care  Follow up with your health care provider, or as advised, if you aren't alert and back to your usual level of activity within 12 hours.  When to seek medical advice  Call your health care provider right away if any of these occur:   Weakness, dizziness, lightheadedness, or fainting   Pain in your chest, arm, shoulder, neck or upper back   Shortness of breath   Rapid heart rate (more than 120 beats per minute at rest)   You feel like your heart is fluttering or beating fast, hard, or irregularly (palpitations)   You have problems speaking or seeing   Weakness in an arm or leg   More than minor skin discomfort or redness where the cardioversion pads were placed   2000-2015 [employer] [address], [place] [address]. All rights reserved. This   information is not intended as a substitute for professional medical care. Always follow your healthcare professional's instructions.

## 2015-01-28 NOTE — Anesthesia Postprocedure Evaluation (Signed)
Anesthesia Post Evaluation    Patient: [name]    * No procedures listed *    Anesthesia type: general    Last Vitals:   Filed Vitals:    01/28/15 0835   BP: 129/72   Pulse: 57   Temp:    Resp: 60   SpO2: 95%       Patient Location: Phase II PACU      Post Pain: Patient not complaining of pain, continue current therapy    Mental Status: awake and alert    Respiratory Function: tolerating room air    Cardiovascular: stable    Nausea/Vomiting: patient not complaining of nausea or vomiting    Hydration Status: adequate    Post Assessment: no apparent anesthetic complications, no evidence of recall and no reportable events          Anesthesia Qualified Clinical Data Registry    Central Line      CVC insertion : NO                                               Perioperative temperature management      General/neuraxial anesthesia > or = 60 minutes (excluding CABG) : NO                                Administration of antibiotic prophylaxis      Age > or = 18, with IV access, with surgical procedure for which antibiotic prophylaxis indicated, and not on chronic antibiotics : NO                  Medication Administration      Ordering or administration of drug inconsistent with intended drug, dose, delivery or timing : NO      Dental loss/damage      Dental injury with administration of anesthesia : NO      Difficult intubation due to unrecognized difficult airway        Elective airway procedure including but not limited to: tracheostomy, fiberoptic bronchoscopy, rigid bronchoscopy; jet ventilation; or elective use of a device to facilitate airway management such as a Glidescope : NO                > Unanticipated difficult intubation post pre-evaluation : NO      Aspiration of gastric contents        Aspiration of gastric contents : NO                    Surgical fire        Procedure requiring electrocautery/laser : NO                    Immediate perioperative cardiac arrest        Cardiac arrest in OR or  PACU : NO                    Unplanned hospital admission        Unplanned hospital admission for initially intended outpatient anesthesia service : NO      Unplanned ICU admission        Unplanned ICU admission related to anesthesia occurring within 24 hours of induction or start of MAC : NO      Surgical  case cancellation        Cancellation of procedure after care already started by anesthesia care team : NO      Post-anesthesia transfer of care checklist/protocol to PACU        Transfer from OR to PACU upon case conclusion : YES              > Use of PACU transfer checklist/protocol : YES     (Includes the key elements of: patient identification, responsible practitioner identification (PACU nurse or advanced practitioner), discussion of pertinent history and procedure course, intraoperative anesthetic management, post-procedure plans, acknowledgement/questions)    Post-anesthesia transfer of care checklist/protocol to ICU        Transfer from OR to ICU upon case conclusion : NO                    Post-operative nausea/vomiting risk protocol        Post-operative nausea/vomiting risk protocol : NO  Patient > or = 18 with care initiated by anesthesia team that has a risk factor screen for post-op nausea/vomiting (Includes female, hx PONV, or motion sickness, non-smoker, intended opioid administration for post-op analgesia.)    Anaphylaxis        Anaphylaxis during anesthesia services : NO    (Inclusive of any suspected transfusion reaction in association with blood-bank confirmed blood product incompatibility)              Barbara Cower, 01/28/2015 9:00 AM

## 2015-01-28 NOTE — Progress Notes (Signed)
Pt s/p successful cardioversion with 200 J x1 biphasic, synchronized shock. Pt tolerated well. AA+Ox4, MAE, speech clear, VSS. Denies pain, anterior and posterior chest skin intact. Pt resting comfortably. Dr Amado Coe talked to pt's husband, plan of care discussed. Will continue to monitor pt.

## 2015-01-28 NOTE — Progress Notes (Signed)
Pt has no complaints, VSS, skin intact. Pt taking po fluids well. Discharge instructions given, all questions answered. IV [place]-ed, than pt discharged to home in stable condition, via wheelchair.

## 2015-01-28 NOTE — Procedures (Signed)
Pre-operative diagnosis: persistent AF  Post-operative dx: same  Procedure:  Synchronized DCCV    Prior to the procedure informed consent was obtained.  Longstanding therapeutic INRs were reviewed and confirmed.  The patient was sedated with 60mg  IV propofol with adequate affect.  A single 200J CV was performed with successful restoration of SR.      Summary:  Successful CV of AF into SR    Plan:  Bed rest x 2 hrs  Continue coumadin w/ goal INR 2-3  Continue amiodarone  EP f/u 4 weeks.

## 2015-01-28 NOTE — Anesthesia Preprocedure Evaluation (Signed)
Anesthesia Evaluation    AIRWAY    Mallampati: II    TM distance: >3 FB  Neck ROM: full  Mouth Opening:full   CARDIOVASCULAR    irregular and normal       DENTAL    no notable dental hx     PULMONARY    pulmonary exam normal     OTHER FINDINGS                      Anesthesia Plan    ASA 2     general                     intravenous induction   Detailed anesthesia plan: general IV            informed consent obtained

## 2015-01-28 NOTE — Transfer of Care (Signed)
Anesthesia Transfer of Care Note    Patient: [name]    Procedures performed: Ext cardioversion by cardiology    Anesthesia type: General TIVA    Patient location:PACU    Last vitals:   Filed Vitals:    01/28/15 0826   BP: 119/64   Pulse: 53   Temp:    Resp: 17   SpO2: 96%       Post pain: Patient not complaining of pain, continue current therapy      Mental Status:awake    Respiratory Function: tolerating room air    Cardiovascular: stable    Nausea/Vomiting: patient not complaining of nausea or vomiting    Hydration Status: adequate    Post assessment: no apparent anesthetic complications, no reportable events and no evidence of recall

## 2015-02-06 ENCOUNTER — Ambulatory Visit (INDEPENDENT_AMBULATORY_CARE_PROVIDER_SITE_OTHER): Payer: Self-pay | Admitting: Cardiology

## 2015-02-06 ENCOUNTER — Ambulatory Visit (INDEPENDENT_AMBULATORY_CARE_PROVIDER_SITE_OTHER): Payer: Self-pay

## 2015-02-26 ENCOUNTER — Ambulatory Visit (INDEPENDENT_AMBULATORY_CARE_PROVIDER_SITE_OTHER): Payer: Self-pay

## 2015-03-26 ENCOUNTER — Ambulatory Visit (INDEPENDENT_AMBULATORY_CARE_PROVIDER_SITE_OTHER): Payer: Self-pay

## 2015-04-18 ENCOUNTER — Other Ambulatory Visit (INDEPENDENT_AMBULATORY_CARE_PROVIDER_SITE_OTHER): Payer: Self-pay

## 2015-04-18 ENCOUNTER — Ambulatory Visit (INDEPENDENT_AMBULATORY_CARE_PROVIDER_SITE_OTHER): Payer: Self-pay

## 2015-04-18 ENCOUNTER — Ambulatory Visit (INDEPENDENT_AMBULATORY_CARE_PROVIDER_SITE_OTHER): Payer: Self-pay | Admitting: Cardiovascular Disease

## 2015-04-23 ENCOUNTER — Ambulatory Visit (INDEPENDENT_AMBULATORY_CARE_PROVIDER_SITE_OTHER): Payer: Self-pay

## 2015-05-21 ENCOUNTER — Ambulatory Visit (INDEPENDENT_AMBULATORY_CARE_PROVIDER_SITE_OTHER): Payer: Self-pay

## 2015-06-20 ENCOUNTER — Ambulatory Visit (INDEPENDENT_AMBULATORY_CARE_PROVIDER_SITE_OTHER): Payer: Self-pay

## 2015-06-23 DIAGNOSIS — S7290XA Unspecified fracture of unspecified femur, initial encounter for closed fracture: Secondary | ICD-10-CM

## 2015-06-23 HISTORY — DX: Unspecified fracture of unspecified femur, initial encounter for closed fracture: S72.90XA

## 2015-07-18 ENCOUNTER — Ambulatory Visit (INDEPENDENT_AMBULATORY_CARE_PROVIDER_SITE_OTHER): Payer: Self-pay

## 2015-08-19 ENCOUNTER — Ambulatory Visit (INDEPENDENT_AMBULATORY_CARE_PROVIDER_SITE_OTHER): Payer: Self-pay | Admitting: Cardiology

## 2015-08-27 ENCOUNTER — Ambulatory Visit (INDEPENDENT_AMBULATORY_CARE_PROVIDER_SITE_OTHER): Payer: Self-pay

## 2015-09-04 ENCOUNTER — Other Ambulatory Visit: Payer: Self-pay | Admitting: Cardiology

## 2015-09-04 DIAGNOSIS — I340 Nonrheumatic mitral (valve) insufficiency: Secondary | ICD-10-CM

## 2015-09-10 ENCOUNTER — Ambulatory Visit (INDEPENDENT_AMBULATORY_CARE_PROVIDER_SITE_OTHER): Payer: Self-pay

## 2015-09-11 ENCOUNTER — Ambulatory Visit
Admission: RE | Admit: 2015-09-11 | Discharge: 2015-09-11 | Disposition: A | Discharge status: Home / Self Care | Payer: Medicare Other | Source: Ambulatory Visit | Attending: Cardiology | Admitting: Cardiology

## 2015-09-11 DIAGNOSIS — I340 Nonrheumatic mitral (valve) insufficiency: Secondary | ICD-10-CM

## 2015-09-11 DIAGNOSIS — I517 Cardiomegaly: Secondary | ICD-10-CM | POA: Insufficient documentation

## 2015-09-11 DIAGNOSIS — I088 Other rheumatic multiple valve diseases: Secondary | ICD-10-CM | POA: Insufficient documentation

## 2015-09-11 DIAGNOSIS — I5189 Other ill-defined heart diseases: Secondary | ICD-10-CM | POA: Insufficient documentation

## 2015-09-17 ENCOUNTER — Other Ambulatory Visit (INDEPENDENT_AMBULATORY_CARE_PROVIDER_SITE_OTHER): Payer: Self-pay

## 2015-09-17 ENCOUNTER — Ambulatory Visit (INDEPENDENT_AMBULATORY_CARE_PROVIDER_SITE_OTHER): Payer: Self-pay

## 2015-09-17 ENCOUNTER — Ambulatory Visit (INDEPENDENT_AMBULATORY_CARE_PROVIDER_SITE_OTHER): Payer: Self-pay | Admitting: Cardiovascular Disease

## 2015-09-18 ENCOUNTER — Ambulatory Visit (INDEPENDENT_AMBULATORY_CARE_PROVIDER_SITE_OTHER): Payer: Self-pay | Admitting: Nurse Practitioner

## 2015-09-24 ENCOUNTER — Ambulatory Visit (INDEPENDENT_AMBULATORY_CARE_PROVIDER_SITE_OTHER): Payer: Self-pay

## 2015-10-08 ENCOUNTER — Ambulatory Visit (INDEPENDENT_AMBULATORY_CARE_PROVIDER_SITE_OTHER): Payer: Self-pay

## 2015-10-31 ENCOUNTER — Ambulatory Visit (INDEPENDENT_AMBULATORY_CARE_PROVIDER_SITE_OTHER): Payer: Self-pay

## 2015-11-07 ENCOUNTER — Ambulatory Visit (INDEPENDENT_AMBULATORY_CARE_PROVIDER_SITE_OTHER): Payer: Self-pay

## 2015-11-12 ENCOUNTER — Ambulatory Visit (INDEPENDENT_AMBULATORY_CARE_PROVIDER_SITE_OTHER): Payer: Self-pay

## 2015-11-19 ENCOUNTER — Ambulatory Visit (INDEPENDENT_AMBULATORY_CARE_PROVIDER_SITE_OTHER): Payer: Self-pay

## 2015-12-03 ENCOUNTER — Ambulatory Visit (INDEPENDENT_AMBULATORY_CARE_PROVIDER_SITE_OTHER): Payer: Self-pay

## 2015-12-10 ENCOUNTER — Ambulatory Visit (INDEPENDENT_AMBULATORY_CARE_PROVIDER_SITE_OTHER): Payer: Self-pay | Admitting: Cardiovascular Disease

## 2015-12-10 ENCOUNTER — Ambulatory Visit (INDEPENDENT_AMBULATORY_CARE_PROVIDER_SITE_OTHER): Payer: Self-pay

## 2015-12-10 ENCOUNTER — Other Ambulatory Visit (INDEPENDENT_AMBULATORY_CARE_PROVIDER_SITE_OTHER): Payer: Self-pay

## 2015-12-16 ENCOUNTER — Encounter: Payer: Self-pay | Admitting: Clinical Cardiac Electrophysiology

## 2015-12-31 ENCOUNTER — Ambulatory Visit (INDEPENDENT_AMBULATORY_CARE_PROVIDER_SITE_OTHER): Payer: Self-pay

## 2016-01-07 ENCOUNTER — Ambulatory Visit (INDEPENDENT_AMBULATORY_CARE_PROVIDER_SITE_OTHER): Payer: Self-pay

## 2016-01-14 ENCOUNTER — Ambulatory Visit (INDEPENDENT_AMBULATORY_CARE_PROVIDER_SITE_OTHER): Payer: Self-pay

## 2016-01-21 ENCOUNTER — Ambulatory Visit: Payer: Medicare Other

## 2016-01-21 NOTE — Pre-Procedure Instructions (Signed)
   Procedure verified 01/29/2016   Pt ID verified   Pt verbalized understanding of NPO instructions:  NPO after MN with sips of water to take am medications - read back   Arrival/Procedure time reviewed: 1000/1200 read back    Procedure location:  Park in [employer] garage.  [facility] ground level, EP/Cath Lab check-in    Labs done 01/18/2016, in [facility]   Ride arranged with husband, Jonny Ruiz   On Coumadin

## 2016-02-11 ENCOUNTER — Ambulatory Visit (INDEPENDENT_AMBULATORY_CARE_PROVIDER_SITE_OTHER): Payer: Self-pay

## 2016-02-18 ENCOUNTER — Ambulatory Visit: Payer: Medicare Other

## 2016-02-18 ENCOUNTER — Ambulatory Visit (INDEPENDENT_AMBULATORY_CARE_PROVIDER_SITE_OTHER): Payer: Self-pay

## 2016-02-18 NOTE — Pre-Procedure Instructions (Signed)
   Procedure Verified: 02/26/2016   Pt ID verified   Arrival time at 0700   w read back , parking at gray garage, check in at CATH/EP lab registration, [facility] grd flr    Nothing to eat and drink after midnight, w read back   May take morning meds w sips of water, Reinforced medication instructions by MD   Pt instructed to bring medication list dos   Labs to be done @ [facility] 02/19/2016.    INR to be done @ [facility] 02/21/2016, 02/25/2016    Ride arranged with husband Jonny Ruiz [phone]

## 2016-02-19 ENCOUNTER — Ambulatory Visit (INDEPENDENT_AMBULATORY_CARE_PROVIDER_SITE_OTHER): Payer: Self-pay | Admitting: Cardiology

## 2016-02-26 ENCOUNTER — Ambulatory Visit (INDEPENDENT_AMBULATORY_CARE_PROVIDER_SITE_OTHER): Payer: Self-pay | Admitting: Clinical Cardiac Electrophysiology

## 2016-02-26 ENCOUNTER — Ambulatory Visit: Payer: Medicare Other | Admitting: Anesthesiology

## 2016-02-26 ENCOUNTER — Ambulatory Visit: Payer: Medicare Other

## 2016-02-26 ENCOUNTER — Encounter
Admission: RE | Disposition: A | Discharge status: Home / Self Care | Payer: Self-pay | Source: Ambulatory Visit | Attending: Clinical Cardiac Electrophysiology

## 2016-02-26 ENCOUNTER — Inpatient Hospital Stay: Payer: Medicare Other | Admitting: Clinical Cardiac Electrophysiology

## 2016-02-26 ENCOUNTER — Inpatient Hospital Stay
Admission: RE | Admit: 2016-02-26 | Discharge: 2016-02-29 | DRG: 274 | Disposition: A | Discharge status: Home / Self Care | Payer: Medicare Other | Source: Ambulatory Visit | Attending: Clinical Cardiac Electrophysiology | Admitting: Clinical Cardiac Electrophysiology

## 2016-02-26 DIAGNOSIS — M810 Age-related osteoporosis without current pathological fracture: Secondary | ICD-10-CM | POA: Diagnosis present

## 2016-02-26 DIAGNOSIS — Z7983 Long term (current) use of bisphosphonates: Secondary | ICD-10-CM

## 2016-02-26 DIAGNOSIS — E039 Hypothyroidism, unspecified: Secondary | ICD-10-CM | POA: Diagnosis present

## 2016-02-26 DIAGNOSIS — I272 Other secondary pulmonary hypertension: Secondary | ICD-10-CM | POA: Diagnosis present

## 2016-02-26 DIAGNOSIS — I481 Persistent atrial fibrillation: Principal | ICD-10-CM | POA: Diagnosis present

## 2016-02-26 DIAGNOSIS — K219 Gastro-esophageal reflux disease without esophagitis: Secondary | ICD-10-CM | POA: Diagnosis present

## 2016-02-26 DIAGNOSIS — I10 Essential (primary) hypertension: Secondary | ICD-10-CM | POA: Diagnosis present

## 2016-02-26 DIAGNOSIS — Z79899 Other long term (current) drug therapy: Secondary | ICD-10-CM

## 2016-02-26 DIAGNOSIS — K900 Celiac disease: Secondary | ICD-10-CM | POA: Diagnosis present

## 2016-02-26 DIAGNOSIS — Z7901 Long term (current) use of anticoagulants: Secondary | ICD-10-CM

## 2016-02-26 DIAGNOSIS — I4891 Unspecified atrial fibrillation: Secondary | ICD-10-CM | POA: Diagnosis present

## 2016-02-26 LAB — MAGNESIUM: Magnesium: 1.8 mg/dL (ref 1.6–2.6)

## 2016-02-26 LAB — BASIC METABOLIC PANEL
BUN: 19 mg/dL (ref 7.0–19.0)
CO2: 25 mEq/L (ref 22–29)
Calcium: 8.5 mg/dL (ref 7.9–10.2)
Chloride: 110 mEq/L (ref 100–111)
Creatinine: 1 mg/dL (ref 0.6–1.0)
Glucose: 125 mg/dL — ABNORMAL HIGH (ref 70–100)
Potassium: 5.3 mEq/L — ABNORMAL HIGH (ref 3.5–5.1)
Sodium: 142 mEq/L (ref 136–145)

## 2016-02-26 LAB — ECG 12-LEAD
Atrial Rate: 88 {beats}/min
P Axis: 87 degrees
P-R Interval: 228 ms
Q-T Interval: 414 ms
QRS Duration: 110 ms
QTC Calculation (Bezet): 500 ms
R Axis: -61 degrees
T Axis: 106 degrees
Ventricular Rate: 88 {beats}/min

## 2016-02-26 LAB — PT/INR
PT INR: 2.5 — ABNORMAL HIGH (ref 0.9–1.1)
PT: 26.8 s — ABNORMAL HIGH (ref 12.6–15.0)

## 2016-02-26 LAB — GFR: EGFR: 53.1

## 2016-02-26 SURGERY — ABLATION - ATRIAL FIBRILLATION
Anesthesia: Anesthesia General

## 2016-02-26 MED ORDER — CEFUROXIME SODIUM 1.5 G IJ/IV SOLR (WRAP)
Status: DC | PRN
Start: 2016-02-26 — End: 2016-02-26
  Administered 2016-02-26: 1.5 g via INTRAVENOUS

## 2016-02-26 MED ORDER — FENTANYL CITRATE (PF) 50 MCG/ML IJ SOLN (WRAP)
INTRAMUSCULAR | Status: DC | PRN
Start: 2016-02-26 — End: 2016-02-26
  Administered 2016-02-26 (×2): 25 ug via INTRAVENOUS
  Administered 2016-02-26: 50 ug via INTRAVENOUS

## 2016-02-26 MED ORDER — POTASSIUM CHLORIDE CRYS ER 20 MEQ PO TBCR
20.0000 meq | EXTENDED_RELEASE_TABLET | Freq: Once | ORAL | Status: DC | PRN
Start: 2016-02-26 — End: 2016-02-29

## 2016-02-26 MED ORDER — MAGNESIUM OXIDE 400 (241.3 MG) MG PO TABS
200.0000 mg | ORAL_TABLET | Freq: Every day | ORAL | Status: DC
Start: 2016-02-27 — End: 2016-02-29
  Administered 2016-02-27 – 2016-02-29 (×3): 200 mg via ORAL
  Filled 2016-02-26 (×3): qty 1

## 2016-02-26 MED ORDER — LISINOPRIL 5 MG PO TABS
5.0000 mg | ORAL_TABLET | Freq: Every day | ORAL | Status: DC
Start: 2016-02-26 — End: 2016-02-29
  Administered 2016-02-27 – 2016-02-29 (×2): 5 mg via ORAL
  Filled 2016-02-26 (×3): qty 1

## 2016-02-26 MED ORDER — CETIRIZINE HCL 10 MG PO TABS
10.0000 mg | ORAL_TABLET | Freq: Every day | ORAL | Status: DC
Start: 2016-02-27 — End: 2016-02-29
  Administered 2016-02-27 – 2016-02-29 (×3): 10 mg via ORAL
  Filled 2016-02-26 (×3): qty 1

## 2016-02-26 MED ORDER — FUROSEMIDE 20 MG PO TABS
20.0000 mg | ORAL_TABLET | ORAL | Status: DC
Start: 2016-02-27 — End: 2016-02-29
  Administered 2016-02-27 – 2016-02-29 (×2): 20 mg via ORAL
  Filled 2016-02-26 (×2): qty 1

## 2016-02-26 MED ORDER — ACETAMINOPHEN 500 MG PO TABS
500.0000 mg | ORAL_TABLET | Freq: Three times a day (TID) | ORAL | Status: DC
Start: 2016-02-26 — End: 2016-02-29
  Administered 2016-02-26 – 2016-02-29 (×8): 500 mg via ORAL
  Filled 2016-02-26 (×9): qty 1

## 2016-02-26 MED ORDER — PHENYLEPHRINE HCL 10 MG/ML IV SOLN (WRAP)
100.0000 mg | Status: DC | PRN
Start: 2016-02-26 — End: 2016-02-26
  Administered 2016-02-26: 20 ug/min via INTRAVENOUS

## 2016-02-26 MED ORDER — DEXAMETHASONE SODIUM PHOSPHATE 4 MG/ML IJ SOLN (WRAP)
INTRAMUSCULAR | Status: DC | PRN
Start: 2016-02-26 — End: 2016-02-26
  Administered 2016-02-26: 4 mg via INTRAVENOUS

## 2016-02-26 MED ORDER — LACTATED RINGERS IV SOLN
INTRAVENOUS | Status: DC
Start: 2016-02-26 — End: 2016-02-29

## 2016-02-26 MED ORDER — SIMVASTATIN 10 MG PO TABS
10.0000 mg | ORAL_TABLET | Freq: Every evening | ORAL | Status: DC
Start: 2016-02-26 — End: 2016-02-29
  Administered 2016-02-26 – 2016-02-28 (×3): 10 mg via ORAL
  Filled 2016-02-26 (×4): qty 1

## 2016-02-26 MED ORDER — LEVOTHYROXINE SODIUM 112 MCG PO TABS
112.0000 ug | ORAL_TABLET | Freq: Every day | ORAL | Status: DC
Start: 2016-02-27 — End: 2016-02-29
  Administered 2016-02-27 – 2016-02-29 (×3): 112 ug via ORAL
  Filled 2016-02-26 (×3): qty 1

## 2016-02-26 MED ORDER — POTASSIUM CHLORIDE CRYS ER 20 MEQ PO TBCR
40.0000 meq | EXTENDED_RELEASE_TABLET | Freq: Once | ORAL | Status: DC | PRN
Start: 2016-02-26 — End: 2016-02-29

## 2016-02-26 MED ORDER — HEPARIN SODIUM (PORCINE) 1000 UNIT/ML IJ SOLN
INTRAMUSCULAR | Status: AC
Start: 2016-02-26 — End: 2016-02-26
  Administered 2016-02-26: 2000 [IU] via INTRAVENOUS
  Administered 2016-02-26 (×2): 5000 [IU] via INTRAVENOUS
  Filled 2016-02-26: qty 10

## 2016-02-26 MED ORDER — GABAPENTIN 100 MG PO CAPS
100.0000 mg | ORAL_CAPSULE | Freq: Three times a day (TID) | ORAL | Status: DC
Start: 2016-02-26 — End: 2016-02-29
  Administered 2016-02-26 – 2016-02-29 (×9): 100 mg via ORAL
  Filled 2016-02-26 (×10): qty 1

## 2016-02-26 MED ORDER — NEOSTIGMINE METHYLSULFATE 1 MG/ML IJ SOLN
INTRAMUSCULAR | Status: DC | PRN
Start: 2016-02-26 — End: 2016-02-26
  Administered 2016-02-26: 1.5 mg via INTRAVENOUS

## 2016-02-26 MED ORDER — FLUTICASONE PROPIONATE 50 MCG/ACT NA SUSP
2.0000 | Freq: Every day | NASAL | Status: DC
Start: 2016-02-26 — End: 2016-02-29
  Administered 2016-02-27 – 2016-02-29 (×3): 2 via NASAL
  Filled 2016-02-26: qty 16

## 2016-02-26 MED ORDER — TAB-A-VITE/BETA CAROTENE PO TABS
1.0000 | ORAL_TABLET | Freq: Every day | ORAL | Status: DC
Start: 2016-02-27 — End: 2016-02-29
  Administered 2016-02-27 – 2016-02-29 (×3): 1 via ORAL
  Filled 2016-02-26 (×3): qty 1

## 2016-02-26 MED ORDER — PHENYLEPHRINE 100 MCG/ML IN NACL 0.9% IV SOSY
PREFILLED_SYRINGE | INTRAVENOUS | Status: DC | PRN
Start: 2016-02-26 — End: 2016-02-26
  Administered 2016-02-26 (×4): 100 ug via INTRAVENOUS

## 2016-02-26 MED ORDER — SODIUM CHLORIDE 0.9 % IJ SOLN
3.0000 mL | Freq: Three times a day (TID) | INTRAMUSCULAR | Status: DC
Start: 2016-02-26 — End: 2016-02-29
  Administered 2016-02-28 – 2016-02-29 (×3): 3 mL via INTRAVENOUS

## 2016-02-26 MED ORDER — GLYCOPYRROLATE 0.2 MG/ML IJ SOLN
INTRAMUSCULAR | Status: DC | PRN
Start: 2016-02-26 — End: 2016-02-26
  Administered 2016-02-26: .2 mg via INTRAVENOUS

## 2016-02-26 MED ORDER — PROTAMINE SULFATE 10 MG/ML IV SOLN
INTRAVENOUS | Status: DC | PRN
Start: 2016-02-26 — End: 2016-02-26
  Administered 2016-02-26: 100 mg via INTRAVENOUS

## 2016-02-26 MED ORDER — ONDANSETRON HCL 4 MG/2ML IJ SOLN
INTRAMUSCULAR | Status: DC | PRN
Start: 2016-02-26 — End: 2016-02-26
  Administered 2016-02-26: 4 mg via INTRAVENOUS

## 2016-02-26 MED ORDER — ROCURONIUM BROMIDE 50 MG/5ML IV SOLN
INTRAVENOUS | Status: DC | PRN
Start: 2016-02-26 — End: 2016-02-26
  Administered 2016-02-26: 25 mg via INTRAVENOUS
  Administered 2016-02-26: 10 mg via INTRAVENOUS

## 2016-02-26 MED ORDER — DOFETILIDE 250 MCG PO CAPS
250.0000 ug | ORAL_CAPSULE | Freq: Two times a day (BID) | ORAL | Status: DC
Start: 2016-02-26 — End: 2016-02-29
  Administered 2016-02-26 – 2016-02-28 (×5): 250 ug via ORAL
  Filled 2016-02-26 (×20): qty 1

## 2016-02-26 MED ORDER — HEPARIN (PORCINE) IN D5W 50-5 UNIT/ML-% IV SOLN
INTRAVENOUS | Status: AC
Start: 2016-02-26 — End: 2016-02-26
  Administered 2016-02-26: 500 [IU]/h via INTRAVENOUS
  Filled 2016-02-26: qty 500

## 2016-02-26 MED ORDER — HEPARIN (PORCINE) IN NACL 2-0.9 UNIT/ML-% IJ SOLN
INTRAMUSCULAR | Status: AC
Start: 2016-02-26 — End: 2016-02-26
  Filled 2016-02-26: qty 4000

## 2016-02-26 MED ORDER — POTASSIUM CHLORIDE CRYS ER 20 MEQ PO TBCR
60.0000 meq | EXTENDED_RELEASE_TABLET | Freq: Once | ORAL | Status: DC | PRN
Start: 2016-02-26 — End: 2016-02-26

## 2016-02-26 MED ORDER — WARFARIN SODIUM 2.5 MG PO TABS
2.5000 mg | ORAL_TABLET | Freq: Once | ORAL | Status: AC
Start: 2016-02-26 — End: 2016-02-26
  Administered 2016-02-26: 2.5 mg via ORAL
  Filled 2016-02-26: qty 1

## 2016-02-26 MED ORDER — MAGNESIUM SULFATE IN D5W 1-5 GM/100ML-% IV SOLN
1.0000 g | INTRAVENOUS | Status: DC | PRN
Start: 2016-02-26 — End: 2016-02-29
  Administered 2016-02-26: 1 g via INTRAVENOUS
  Filled 2016-02-26: qty 100

## 2016-02-26 MED ORDER — POTASSIUM CHLORIDE 10 MEQ/100ML IV SOLN
10.0000 meq | INTRAVENOUS | Status: DC | PRN
Start: 2016-02-26 — End: 2016-02-29

## 2016-02-26 MED ORDER — LIDOCAINE HCL (PF) 1 % IJ SOLN
INTRAMUSCULAR | Status: AC
Start: 2016-02-26 — End: 2016-02-26
  Administered 2016-02-26: 5 mL via SUBCUTANEOUS
  Filled 2016-02-26: qty 30

## 2016-02-26 MED ORDER — PROPOFOL INFUSION 10 MG/ML
INTRAVENOUS | Status: DC | PRN
Start: 2016-02-26 — End: 2016-02-26
  Administered 2016-02-26: 40 mg via INTRAVENOUS
  Administered 2016-02-26: 100 mg via INTRAVENOUS

## 2016-02-26 NOTE — Anesthesia Postprocedure Evaluation (Signed)
Anesthesia Post Evaluation    Patient: [name]    Procedure(s) with comments:  Ablation - Atrial Fibrillation -T - TEE  ICE/CARTO/w dr Amado Coe    Anesthesia type: general    Last Vitals:   Filed Vitals:    02/26/16 1500   BP: 102/55   Pulse: 77   Temp:    Resp: 16   SpO2: 93%       Patient Location: Phase II PACU      Post Pain: Patient not complaining of pain, continue current therapy    Mental Status: awake and alert    Respiratory Function: tolerating room air    Cardiovascular: stable    Nausea/Vomiting: patient not complaining of nausea or vomiting    Hydration Status: adequate    Post Assessment: no apparent anesthetic complications, no evidence of recall and no reportable events          Anesthesia Qualified Clinical Data Registry    Central Line      CVC insertion : NO                                               Perioperative temperature management      General/neuraxial anesthesia > or = 60 minutes (excluding CABG) : YES              > Use of intraoperative active warming : YES              > Temperature > or = 36 degrees Centigrade (96.8 degrees Farenheit) during time span from 30 minutes before up to 15 minutes after anesthesia end time : YES      Administration of antibiotic prophylaxis      Age > or = 18, with IV access, with surgical procedure for which antibiotic prophylaxis indicated, and not on chronic antibiotics : YES              > Prophylactic antibiotics within 1 hour of incision (or fluroroquinolone/vancomycin within 2 hours of incision) : YES    Medication Administration      Ordering or administration of drug inconsistent with intended drug, dose, delivery or timing : NO      Dental loss/damage      Dental injury with administration of anesthesia : NO      Difficult intubation due to unrecognized difficult airway        Elective airway procedure including but not limited to: tracheostomy, fiberoptic bronchoscopy, rigid bronchoscopy; jet ventilation; or elective use of a device to  facilitate airway management such as a Glidescope : NO                > Unanticipated difficult intubation post pre-evaluation : NO      Aspiration of gastric contents        Aspiration of gastric contents : NO                    Surgical fire        Procedure requiring electrocautery/laser : YES                > Ignition/burning in invasive procedure location : NO      Immediate perioperative cardiac arrest        Cardiac arrest in OR or PACU : NO  Unplanned hospital admission        Unplanned hospital admission for initially intended outpatient anesthesia service : NO      Unplanned ICU admission        Unplanned ICU admission related to anesthesia occurring within 24 hours of induction or start of MAC : NO      Surgical case cancellation        Cancellation of procedure after care already started by anesthesia care team : NO      Post-anesthesia transfer of care checklist/protocol to PACU        Transfer from OR to PACU upon case conclusion : YES              > Use of PACU transfer checklist/protocol : YES     (Includes the key elements of: patient identification, responsible practitioner identification (PACU nurse or advanced practitioner), discussion of pertinent history and procedure course, intraoperative anesthetic management, post-procedure plans, acknowledgement/questions)    Post-anesthesia transfer of care checklist/protocol to ICU        Transfer from OR to ICU upon case conclusion : NO                    Post-operative nausea/vomiting risk protocol        Post-operative nausea/vomiting risk protocol : YES  Patient > or = 18 with care initiated by anesthesia team that has a risk factor screen for post-op nausea/vomiting (Includes female, hx PONV, or motion sickness, non-smoker, intended opioid administration for post-op analgesia.)    Anaphylaxis        Anaphylaxis during anesthesia services : NO    (Inclusive of any suspected transfusion reaction in association with blood-bank  confirmed blood product incompatibility)              Barbara Cower, 02/26/2016 3:23 PM

## 2016-02-26 NOTE — Plan of Care (Signed)
Problem: Safety  Goal: Patient will be free from injury during hospitalization  Outcome: Progressing    Problem: Pain  Goal: Pain at adequate level as identified by patient  Outcome: Progressing    Problem: Hemodynamic Status: Cardiac  Goal: Stable vital signs and fluid balance  Outcome: Progressing    Comments:   Pt A/Ox4, one person standby assist with ambulation to bathroom. Bilateral lungs clear diminished on RA. EKG performed 2 hrs post tikosyn dose, shows NSR with frequent PACs and PVCs, HR 82, QT/QTC 424/495. On call EP paged, per Dr. Lorel Monaco okay to continue same dose. Pt is on cardiac, gluten-free diet, has celiac disease. Pt bilateral groin sites covered with gauze and tegaderm, scant serosanguinous drainage, bruising noted on bilateral sites. No oozing noted. Bed placed in lowest position, call light within reach, fall mat placed, wheels locked. Pt denies pain this shift and is currently resting comfortably.     Plan: Continue tikosyn loading, monitor electrolytes. Continue EKG's 2hrs post tikosyn dose.

## 2016-02-26 NOTE — Plan of Care (Signed)
Baseline ECG with QT/QTc (manually calculated by me) of 400/449 msec.  Initiate tikosyn therapy BID with 7a/7p dosing.    Thanks,   Karis Juba, PA-C  [facility]  [phone] FFX Spectralink.

## 2016-02-26 NOTE — Plan of Care (Addendum)
Problem: Moderate/High Fall Risk Score >5  Goal: Patient will remain free of falls  Outcome: Progressing    02/26/16 1730   OTHER   Moderate Risk (6-13) MOD-Floor mat at bedside (where available) if appropriate;MOD-Remain with patient during toileting;MOD-(VH Only) Yellow "Fall Risk" signage       Assumed care of pt from [facility] at 1700 s/p afib ablation. Pt a&ox4. R groin puncture site has scant amount of drainage. Present upon admission. L groin dressing c/d/i. Minimal bruising around the puncture site. Mg 1.8 given replacement via IVPB.  Spoke to Dr. Lorel Monaco re: K+ of 5.3. Per MD no changes and proceed with giving Tikosyn. First dose given at 1902. ECG 2 hours post dose.  Pt INR 2.5. Given coumadin w/ blood draw due in AM.
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
## 2016-02-26 NOTE — Progress Notes (Signed)
Report and pt received in ICAR room 37 from procedural area s/p afib ablation. Assumed pt's care. Right femoral vein puncture site is CDI.  No bleeding or hematoma noted. Left femoral vein puncture site noted to have a sizeable hematoma that was hemostased by lab staff.  No active bleeding or hematoma noted. ID band verified, alert and oriented x3, VSS, aldrete 9, pain 0/10, SR with occasional PVC on monitor.    Oriented pt to call bell and placed within reach. Family retrieved from waiting area to bedside.

## 2016-02-26 NOTE — Transfer of Care (Signed)
Anesthesia Transfer of Care Note    Patient: [name]    Procedures performed: Procedure(s) with comments:  Ablation - Atrial Fibrillation -T - TEE  ICE/CARTO/w dr Amado Coe    Anesthesia type: General ETT    Patient location:ICAR    Last vitals:   Filed Vitals:    02/26/16 1434   BP: 120/72   Pulse: 77   Temp: 36.8 C (98.2 F)   Resp: 16   SpO2: 98%       Post pain: Patient not complaining of pain, continue current therapy      Mental Status:awake    Respiratory Function: tolerating face mask    Cardiovascular: stable    Nausea/Vomiting: patient not complaining of nausea or vomiting    Hydration Status: adequate    Post assessment: no apparent anesthetic complications, no reportable events and no evidence of recall    Signed by: Daylene Katayama  02/26/2016 2:34 PM

## 2016-02-26 NOTE — Progress Notes (Signed)
Received pt from home and admitted for Atrial Fibrillation Ablation to [facility] Rm 8. VSS, heart rhythm AFib w BBB and PVC's, c/o chronic back pain with movement/walking, no c/o N/V. Pt confirmed NPO status and her husband will be his transport home tomorrow. Awaiting consent to be obtained by Anesthesia. Bilat distal UE/LE pulses palpable/dopplerable and marked. Reviewed general procedure plan and post-op precautions with pt and her husband. Oriented to room and call light. Call light in reach. Awaiting MD visit then Lab transport.

## 2016-02-26 NOTE — Brief Op Note (Signed)
Dictated  Extensive biatrial ablation for persistent atrial fibrillation

## 2016-02-26 NOTE — Progress Notes (Signed)
Report called to [employer] on [place].  Pt transferred to room 173 via bed with monitor and RN escort.

## 2016-02-26 NOTE — Progress Notes (Signed)
Dr. Amado Coe at bedside.  Updated on pt status and rhythm afib vs. NSR.  Per MD pt to be started on tikosyn.  First dose to be given tonight.

## 2016-02-26 NOTE — Anesthesia Preprocedure Evaluation (Signed)
Anesthesia Evaluation    AIRWAY    Mallampati: II    TM distance: >3 FB  Neck ROM: full  Mouth Opening:full   CARDIOVASCULAR    irregular and normal       DENTAL    no notable dental hx       PULMONARY    decreased breath sounds and bibasilar     OTHER FINDINGS                      Anesthesia Plan    ASA 3     general                     intravenous induction   Detailed anesthesia plan: general endotracheal        Post op pain management: per surgeon    informed consent obtained    Plan discussed with CRNA.

## 2016-02-26 NOTE — Progress Notes (Signed)
Oozing noted to right femoral puncture site.  Dressing changed, quik clot applied.  5 minutes of manual pressure held.  Hemostasis achieved.  Will continue to monitor.

## 2016-02-27 LAB — PT/INR
PT INR: 3 — ABNORMAL HIGH (ref 0.9–1.1)
PT: 31 s — ABNORMAL HIGH (ref 12.6–15.0)

## 2016-02-27 LAB — BASIC METABOLIC PANEL
BUN: 18 mg/dL (ref 7.0–19.0)
CO2: 21 mEq/L — ABNORMAL LOW (ref 22–29)
Calcium: 8.1 mg/dL (ref 7.9–10.2)
Chloride: 106 mEq/L (ref 100–111)
Creatinine: 1 mg/dL (ref 0.6–1.0)
Glucose: 115 mg/dL — ABNORMAL HIGH (ref 70–100)
Potassium: 4.5 mEq/L (ref 3.5–5.1)
Sodium: 137 mEq/L (ref 136–145)

## 2016-02-27 LAB — GFR: EGFR: 53.1

## 2016-02-27 LAB — MAGNESIUM: Magnesium: 2.2 mg/dL (ref 1.6–2.6)

## 2016-02-27 NOTE — Progress Notes (Addendum)
[facility]   PROGRESS NOTE     [facility]       Date Time: 02/27/2016 9:37 AM Patient Name: [name], [name]  Medical Record #:  [id number] Account#:  [id number] Admission Date:  02/26/2016      Assessment:    Admitted 6 Sep 17 for AF ablation and Tikosyn initiation.   Persistent AF with failure of tikosyn and amiodarone.  Now S/P PVI, Biatrial FIRM guided by temporal dispersion, RA isthmus ablation 6 Sep 17.     On tikosyn 250 mcg BID (2 doses thus far) with QT/QTc (manually calculated by [facility]) of 420/464 msec.   On warfarin for AC.   Echo 22 Mar 17 - EF 55%, Mod LAE with LAVI of 65mL/m2, Mod MR, Mild-mod AI, Mod TR, Mod PHTN with RVSP of , Grade II DD.   Severe DLCO abnormality, moderate obstructive pattern, followed by Dr. Stacy Gardner.   Scoliosis.    Recommendations    Continue TIkosyn BID with 7a/7p dosing.   Continue to monitor via telemetry.    Mr. [name] underwent extensive biatrial ablation yesterday.  Overnight, she had runs of atrial arrhythmias but has predominantly remained in normal rhythm.  This is quite promising, given persistent nature of her atrial fibrillation, extensive biatrial scarring and previous failure to maintain normal rhythm on amiodarone.  We will continue to follow closely and she is expected to stay for 6 doses.      Patient Active Problem List   Diagnosis   . Celiac disease   . Scoliosis   . Atrial fibrillation   . Mitral valve disorder   . Disease of tricuspid valve   . Chronic airway obstruction, not elsewhere classified   . AF (atrial fibrillation)         Medications:      Scheduled Meds:      acetaminophen 500 mg Oral TID   cetirizine 10 mg Oral Daily   dofetilide 250 mcg Oral Q12H SCH   fluticasone 2 spray Each Nare Daily   furosemide 20 mg Oral Every Other Day   gabapentin 100 mg Oral TID   levothyroxine 112 mcg Oral Daily   lisinopril 5 mg Oral Daily   magnesium oxide 200 mg Oral Daily   multivitamin 1 tablet Oral Daily   simvastatin  10 mg Oral QHS   sodium chloride (PF) 3 mL Intravenous Q8H       Continuous Infusions:  . lactated ringers Stopped (02/26/16 1433)        Subjective:   No complaints overnight.     Denies chest pain or palpitations.     Physical Exam:     VITAL SIGNS PHYSICAL EXAM   Temp:  [97.3 F (36.3 C)-98.3 F (36.8 C)] 97.8 F (36.6 C)  Heart Rate:  [71-95] 85  Resp Rate:  [14-18] 18  BP: (89-157)/(54-93) 102/74 mmHg  Temp (24hrs), Avg:97.8 F (36.6 C), Min:97.3 F (36.3 C), Max:98.3 F (36.8 C)      Telemetry: NSR with brief PAT and rare PVC's overnight, now NSR with PAC's and PVC's @ 87 bpm.        Intake/Output Summary (Last 24 hours) at 02/27/16 0937  Last data filed at 02/26/16 1806   Gross per 24 hour   Intake   1000 ml   Output      0 ml   Net   1000 ml    Physical Exam  General: frail, awake, alert, breathing comfortable, no acute distress  Head: normocephalic  Eyes: EOM's intact  Cardiovascular: regular rate and rhythm, S1, S2, no S3, no S4, no murmurs, rubs or gallops, frequent extrasystole's appreciated.   Neck: no carotid bruits or JVD  Lungs: clear to auscultation bilaterally, without wheezing, rhonchi, or rales  Abdomen: soft, non-tender, non-distended; normoactive bowel sounds  Extremities: no edema  Pulse: bilateral radial pulses 2+   Neurological: Alert and oriented X3, mood and affect normal  Musculoskeletal:  Good strength and tone     Incision: B/L groin sites C/D/I without hematoma or discharge.     Labs:             Recent Labs  Lab 02/27/16  0224   MAGNESIUM 2.2       Recent Labs  Lab 02/27/16  0224   PT 31.0*   PT INR 3.0*           Recent Labs  Lab 02/27/16  0224 02/26/16  1614   SODIUM 137 142   POTASSIUM 4.5 5.3*   CHLORIDE 106 110   CO2 21* 25   BUN 18.0 19.0   CREATININE 1.0 1.0   EGFR 53.1 53.1   GLUCOSE 115* 125*   CALCIUM 8.1 8.5     .No results found for: BNP   Estimated Creatinine Clearance: 38.7 mL/min (based on Cr of 1).  Diagnostics:     Signed by     Theo Dills, PA-C    [facility]  [facility]    Arrhythmia Spectralink [phone] (8am-4:30pm)  After hours, non urgent consult line [phone]  After Hours, urgent consults [phone]  For EP questions or consults during the week, easiest to call Spectralink [phone]   You can also reach [place] 24/7 through our main EP office [phone].      j

## 2016-02-27 NOTE — Plan of Care (Addendum)
Problem: Hemodynamic Status: Cardiac  Goal: Stable vital signs and fluid balance  Outcome: Progressing    Pt is A&Ox4. Pt did have some minor confusion this morning but resolved within an hour or so as day progressed. HR is SR with frequent PACs and PVCs, 1st degree AV block, and BBB. R/L groin puncture sites from ablation are c/d/i with no changes. Pt c/o throat hoarseness and irritation. Given tylenol PRN for pain. Dose #2 of Tikosyn given at 7:30am. Pt husband at bedside. Pt in chair with bedside table and call bell within reach. Will CTM and follow POC.    Dose #3 of Tikosyn due at 1930.

## 2016-02-27 NOTE — Progress Notes (Addendum)
MEDICARE FOCUS PATIENT:   Pt lives with her spouse and plans to discharge back home. Admitted 6 Sep 17 for AF ablation and Tikosyn initiation.       02/27/16 1031   Patient Type   Within 30 Days of Previous Admission? No   Healthcare Decisions   Interviewed: Patient   English as a second language teacher Information: [phone]   Orientation/Decision Making Abilities of Patient Alert and Oriented x3, able to make decisions   Advance Directive Patient has advance directive, copy in chart   Advance Directive not in Chart (n/a)   Healthcare Agent Appointed No   Healthcare Agent's Name (n/a)   Healthcare Agent's Phone Number (n/a)   Additional Emergency Contacts? (n/a)   Prior to admission   Prior level of function Independent with ADLs   Type of Residence Private residence  (rambler w/ 2-step entry)   Home Layout Two level   Have running water, electricity, heat, etc? Yes   Living Arrangements Spouse/significant other   How do you get to your MD appointments? self / spouse   How do you get your groceries? self /spouse   Who fixes your meals? self / spouse   Who does your laundry? self / spouse   Who picks up your prescriptions? self / spouse   Dressing Independent   Grooming Independent   Feeding Independent   Bathing Independent   Toileting Independent   DME Currently at Home Standard walker;Single point cane;Other (Comment)  (BP machine)   Name of Prior Assisted Living Facility (n/a)   Home Care/Community Services (n/a)   Prior SNF admission? (Detail) (n/a)   Prior Rehab admission? (Detail) (n/a)   Adult Protective Services (APS) involved? No   Discharge Planning   Support Systems Spouse/significant other;Family members  (sister in law lives in area)   Patient expects to be discharged to: home   Anticipated [place] plan discussed with: Same as interviewed;Patient   [place] discussion contact information: [phone]   Follow up appointment scheduled? No   Reason no follow up scheduled? (pending treatment plan / discharge date)   Potential  barriers to discharge: (n/a)   Mode of transportation: Private car (family member)  (spouse to transpor tpt home at discharge)   Consults/Providers   PT Evaluation Needed 2   OT Evalulation Needed 2   SLP Evaluation Needed 2   Outcome Palliative Care Screen Screened but did not meet criteria for intervention   Correct PCP listed in Epic? Yes   Important Message from [facility] Notice   Patient received 1st IMM Letter? Yes   Date of most recent IMM given: 02/26/16

## 2016-02-27 NOTE — Plan of Care (Signed)
Bundle letter delivered and explained to the pt and pt spouse present at bedside. A-fib pt education booklet used to review pt education.    Groin care instructions post ablation reviewed. Pt made aware of the signs and symptoms of bleeding and infection that require immediate MD attention. RN updated the pt that shower baths are ok but to avoid tub baths for a week if possible as standing water can cause froin infection. Pt reports that she usually does tub baths but can do showers as she did after her total knee replacement.    Pt wants to know if she qualifies for HHPT. RN updated the pt that the SW and [facility] liaison will be updated and will follow up with the pt.    Tikosyn pt education handout printed and reviewed with the pt. Pt made aware of the purpose of the medication and possible side effects that require immediate MD attention.    Jorene Guest, RN, MSN, PCCN-K  Patient Care Navigator, [place]  [phone]

## 2016-02-27 NOTE — UM Notes (Signed)
02/27/16 1610  Admit to Inpatient        RUE:AVWUJW fibrillation  LOC: floor  GLOS: 34 day    82 year old woman with amiodarone refractory  persistent atrial fibrillation. She has been symptomatic and additionally  structural heart implications of atrial fibrillation have also been noted  as mitral regurgitation and LV function improved after sustained normal  rhythm. Now that she has failed to maintain normal rhythm despite  treatment with amiodarone, she presents for ablation.    38 y o female pt s/p  Extensive biatrial ablation for persistent atrial fibrillation  Dr. Amado Coe at bedside. Updated on pt status and rhythm afib vs. NSR. Per MD pt to be started on tikosyn. First dose to be given tonight    RN note:   Assumed care of pt from [facility] at 1700 s/p afib ablation. Pt a&ox4. R groin puncture site has scant amount of drainage. Present upon admission. L groin dressing c/d/i. Minimal bruising around the puncture site. Mg 1.8 given replacement via IVPB. Spoke to Dr. Lorel Monaco re: K+ of 5.3. Per MD no changes and proceed with giving Tikosyn. First dose given at 1902. ECG 2 hours post dose. Pt INR 2.5. Given coumadin w/ blood draw due in AM.    Vs-97.8, 85, 18, 102/74, 95% ra,   Labs: pt/inr 31.0/3.0, glucose 115,   Cardiac diet, po tikosyn bid, telemetry,     Cordelia Pen MSN, [employer], [place]  Utilization Review Case Manager  [employer]  [phone]

## 2016-02-27 NOTE — Procedures (Signed)
HISTORY:  [name] is an 82 year old woman with amiodarone refractory  persistent atrial fibrillation.  She has been symptomatic and additionally  structural heart implications of atrial fibrillation have also been noted  as mitral regurgitation and LV function improved after sustained normal  rhythm.  Now that she has failed to maintain normal rhythm despite  treatment with amiodarone, she presents for ablation.     TITLE OF PROCEDURES:   1.  Pulmonary vein isolation.  2.  Biatrial substrate modification guided by mapping.  3.  Right atrial isthmus ablation.  4.  Intracardiac echocardiogram.  5.  Three dimensional mapping.    Assistant: Marlou Starks, MD     ANESTHESIA:  General with intubation and esophageal temperature monitoring.     DESCRIPTION OF PROCEDURE:  The patient was brought to the EP suite in a postabsorptive state.  Vital  signs, electrocardiogram, and pulse oximetry were monitored continuously.   The patient was prepped and draped and bilateral groin regions were  prepared for catheter and sheath insertion.  Local anesthesia was achieved  with 1% lidocaine.  Preprocedure INR was 2.5.     A long 10 and a short 8-French sheath were inserted in the left femoral  vein, Agilis sheath and an 8-French sheath in the right femoral vein.   Heparin bolus was given and drip was started.  ACT was targeted over 300  seconds.  Via Agilis sheath, PentaRay catheter was advanced into right  atrium.     Using Carto mapping system, a detailed voltage/dispersion map was generated.   Areas of dispersion were identified in posterior right atrium and inferior/posterior septum.  There  were extensive areas of electrical scar.     Under intracardiac echo guidance, using Agilis sheath and an RF needle,  interatrial septum was punctured at desired location.  PentaRay catheter  was advanced into left atrium.  A detailed voltage and dispersion map was  now generated in left atrium.  Areas of dispersion were identified  at  septal roof and left posterior wall behind left-sided veins.  Again,  extensive areas of electrical scar were noted.  Limited potentials were  noted within right superior and right inferior pulmonary veins.  PentaRay  catheter was then removed and Topera catheter was advanced into left atrium  via Agilis sheath.  It was deployed.  Three consecutive EPOCHs were  recorded.  Areas of rotors were identified anterior to the left atrial  appendage, posterior wall by left inferior pulmonary vein which extended to  mitral annulus.  Agilis sheath and Topera mapping catheter were then pulled  back into right atrium where it was now deployed and once again 3  consecutive the EPOCHs were recorded.  Areas of rotation were identified  posterior wall at superior vena cava, posterior septum adjacent to superior  vena cava and inferior posterolateral right atrium.     Decision was made to ablate left atrium using cryotherapy.  Via existing  transseptal puncture site, FlexCath was advanced into the left atrium as a  wire was stationed in the left superior pulmonary vein.  Using standard  technique without injecting contrast, 28-mm [employer] and 20-mm Achieve  catheters were advanced and the left superior pulmonary vein was occluded  at the antrum.  Esophageal temperature was continuously monitored. Left  superior pulmonary vein was isolated with first freeze at 45 seconds,  left inferior pulmonary vein did not have any potentials; however, 2 cryo  applications were delivered, 1 at the vein antrum and  second one  posteriorly directed at the area identified by [facility].  Diaphragmatic  stimulation was then initiated.  Right inferior pulmonary vein was isolated  at 55 seconds with first freeze and right superior pulmonary vein at 40  seconds with second freeze.  All 3 veins then received a second cryo  application after initial successful cryo isolation. There was no  significant change in esophageal temperature and diaphragmatic  function  remained normal post-ablation.     [employer] and Achieve catheters were pulled back into [place] which was  pulled back into right atrium.  Achieve and [employer] catheters were  removed.  The Agilis sheath with smartTouch catheter was then advanced into LA via existing transseptal puncture site into left atrium.   The remaining area previously identified by temporal dispersion and focal impulse rotor  modulation was then targeted until that voltage in that area was  eliminated.     The catheter and sheath were then withdrawn into right atrium.  Areas of  dispersion and rotors were targeted for ablation.  Voltage in these areas  were eliminated.  Before ablating any region in the lateral or  posterolateral region of right atrium, high-output pacing was performed to  confirm that there was no phrenic nerve in that vicinity.     At this point, patient was cardioverted to normal rhythm.  Right atrial  isthmus was assessed, conduction was intact with lateral to medial and  medial to lateral delays of under 80 milliseconds.  A series of  radiofrequency lesions were then applied extending from tricuspid annulus  to inferior vena cava.  Post-ablation, these delays were over 180  milliseconds.  An EP study was performed.  AH interval was 120 and HV  interval was 40 milliseconds.  AV Wenckebach cycle length was 500  milliseconds.     Of note, when in atrial fibrillation and following conversion to normal  rhythm, only low amplitude electrograms were noted within coronary sinus.     At this point, heparin was stopped, all catheters were removed.  Protamine  was administered.  Once ACT was under 180 seconds, sheaths were removed and  hemostasis was achieved using manual pressure.  The patient tolerated the  procedure well.  There were no apparent complications.     CONCLUSIONS:  1.  Pulmonary vein isolation.  2.  Right atrial isthmus ablation.  3.  Extensive biatrial ablation guided by temporal dispersion and  focal  impulsive rotor modulation mapping.

## 2016-02-27 NOTE — Plan of Care (Signed)
Problem: Moderate/High Fall Risk Score >5  Goal: Patient will remain free of falls  Outcome: Progressing    02/27/16 0800   OTHER   Moderate Risk (6-13) MOD-(VH Only) Yellow "Fall Risk" signage;MOD-Floor mat at bedside (where available) if appropriate;MOD-Use of chair-pad alarm when appropriate

## 2016-02-27 NOTE — Plan of Care (Signed)
Problem: Safety  Goal: Patient will be free from injury during hospitalization  Outcome: Progressing    Problem: Pain  Goal: Pain at adequate level as identified by patient  Outcome: Progressing    Problem: Hemodynamic Status: Cardiac  Goal: Stable vital signs and fluid balance  Outcome: Progressing    Comments:   Pt A/Ox4, one person standby assist w/ambulation. Bilateral lungs clear, diminished in bases on RA. EKG was done 2 hrs post third tikosyn dose; HR 82, QT/QTC 390/455 showed NSR 1st degree AV block, frequent PACs and PVCS, pt also in/out of afib throughout shift. On call EP notified, per Dr. Amado Coe okay to continue same dose. Next dose to be given in am at 0700. Pt complained of soreness 4/10 on bilateral groins. Groin sites remain unchanged with bruising and old drainage, site soft nontender. Scheduled meds given per [facility], pt reassessed later, now resting comfortably. Bed placed in lowest position, fall mat placed, call light within reach, wheels locked.

## 2016-02-28 LAB — ECG 12-LEAD
Atrial Rate: 82 {beats}/min
Atrial Rate: 84 {beats}/min
Atrial Rate: 82 {beats}/min
Atrial Rate: 74 {beats}/min
P Axis: 82 degrees
P Axis: 86 degrees
P Axis: 82 degrees
P-R Interval: 208 ms
P-R Interval: 128 ms
P-R Interval: 214 ms
P-R Interval: 208 ms
Q-T Interval: 424 ms
Q-T Interval: 418 ms
Q-T Interval: 390 ms
Q-T Interval: 420 ms
QRS Duration: 116 ms
QRS Duration: 114 ms
QRS Duration: 110 ms
QRS Duration: 114 ms
QTC Calculation (Bezet): 495 ms
QTC Calculation (Bezet): 493 ms
QTC Calculation (Bezet): 455 ms
QTC Calculation (Bezet): 466 ms
R Axis: -60 degrees
R Axis: -41 degrees
R Axis: -63 degrees
R Axis: -49 degrees
T Axis: 104 degrees
T Axis: 109 degrees
T Axis: 88 degrees
T Axis: 109 degrees
Ventricular Rate: 82 {beats}/min
Ventricular Rate: 84 {beats}/min
Ventricular Rate: 82 {beats}/min
Ventricular Rate: 74 {beats}/min

## 2016-02-28 LAB — BASIC METABOLIC PANEL
BUN: 27 mg/dL — ABNORMAL HIGH (ref 7.0–19.0)
CO2: 23 mEq/L (ref 22–29)
Calcium: 7.9 mg/dL (ref 7.9–10.2)
Chloride: 105 mEq/L (ref 100–111)
Creatinine: 1.2 mg/dL — ABNORMAL HIGH (ref 0.6–1.0)
Glucose: 94 mg/dL (ref 70–100)
Potassium: 4.3 mEq/L (ref 3.5–5.1)
Sodium: 134 mEq/L — ABNORMAL LOW (ref 136–145)

## 2016-02-28 LAB — MAGNESIUM: Magnesium: 2.2 mg/dL (ref 1.6–2.6)

## 2016-02-28 LAB — PT/INR
PT INR: 3.2 — ABNORMAL HIGH (ref 0.9–1.1)
PT: 32.5 s — ABNORMAL HIGH (ref 12.6–15.0)

## 2016-02-28 LAB — GFR: EGFR: 43

## 2016-02-28 NOTE — Progress Notes (Signed)
Patient received Tikosyn teaching on 02/28/2016 as well as 1 week supply of 250 mcg dose:  Patient has been provided teaching on the following:  Drug interactions, avoid grapefruit/grapefruit juice, proper timing of medication, avoid skipping doses, monitor heart rate, contact MD and go to local ER is symptoms worsen upon discharge, always check with MD and pharmacist if taking any supplements as well as any other medication which might be added in future along with Tikosyn. In addition, notified patient with regards to outpatient pharmacy services at [facility] to fill discharge medication if needed.

## 2016-02-28 NOTE — Plan of Care (Signed)
Problem: Moderate/High Fall Risk Score >5  Goal: Patient will remain free of falls  Outcome: Progressing    02/28/16 0745   OTHER   Moderate Risk (6-13) MOD-Use of chair-pad alarm when appropriate;MOD-Remain with patient during toileting;MOD-Place Fall Risk level on whiteboard in room         Problem: Hemodynamic Status: Cardiac  Goal: Stable vital signs and fluid balance  Outcome: Progressing    02/28/16 1729   Goal/Interventions addressed this shift   Stable vital signs and fluid balance Monitor/assess vital signs and telemetry per unit protocol;Weigh on admission and record weight daily;Assess signs and symptoms associated with cardiac rhythm changes;Monitor for leg swelling/edema and report to LIP if abnormal         Comments:   Pt A&O x4. HR in 70s, SBP 90s-120s during shift. A fib on tele with frequent PVCs. Pt denies CP, SOB and dizziness. 4th dose of Tikosyn given this am before shift and EKG done at 0850. PA Karis Juba notified QT 420/QTc 466; continue dose at Tikosyn. Pt L AC IV infiltrated this am; inserted new IV in R forearm. Pharmacy education completed and pt given 1 week supply of Tikosyn. Pt INR was high 3.2; Coumadin held today per PA James. Repeat INR in am and restart Coumadin if in acceptable range. Pt groin cath site remains bruised. Fall mat down, bed alarm on and linked to phones. Pt safety precautions maintained.    Plan:              5th Dose of Tikosyn due at 1900; EKG 2 hrs post dose administration              INR tomorrow am; possibly restart Coumadin              Monitor groin site              Discharge tomorrow after 6th dose

## 2016-02-28 NOTE — Progress Notes (Signed)
[facility]   PROGRESS NOTE     [facility]       Date Time: 02/28/2016 9:09 AM Patient Name: [name], [name]  Medical Record #:  [id number] Account#:  [id number] Admission Date:  02/26/2016      Assessment:    Admitted 6 Sep 17 for AF ablation and Tikosyn initiation.   Persistent AF with failure of tikosyn and amiodarone. Now S/P PVI, Biatrial FIRM guided by temporal dispersion, RA isthmus ablation 6 Sep 17.    On tikosyn 250 mcg BID (4 doses thus far) with QTc 466   On warfarin for AC.   Echo 22 Mar 17 - EF 55%, Mod LAE with LAVI of 73mL/m2, Mod MR, Mild-mod AI, Mod TR, Mod PHTN with RVSP of , Grade II DD.   Severe DLCO abnormality, moderate obstructive pattern, followed by Dr. Stacy Gardner.   Scoliosis.    Recommendations    Continue Tikosyn 250   Order coumadin tomorrow based on INR   Anticipate [place] tomorrow after the 6th dose      Patient Active Problem List   Diagnosis   . Celiac disease   . Scoliosis   . Atrial fibrillation   . Mitral valve disorder   . Disease of tricuspid valve   . Chronic airway obstruction, not elsewhere classified   . AF (atrial fibrillation)         Medications:      Scheduled Meds:      acetaminophen 500 mg Oral TID   cetirizine 10 mg Oral Daily   dofetilide 250 mcg Oral Q12H SCH   fluticasone 2 spray Each Nare Daily   furosemide 20 mg Oral Every Other Day   gabapentin 100 mg Oral TID   levothyroxine 112 mcg Oral Daily   lisinopril 5 mg Oral Daily   magnesium oxide 200 mg Oral Daily   multivitamin 1 tablet Oral Daily   simvastatin 10 mg Oral QHS   sodium chloride (PF) 3 mL Intravenous Q8H       Continuous Infusions:  . lactated ringers Stopped (02/26/16 1433)        Subjective:   Throat and groin sore    Physical Exam:     VITAL SIGNS PHYSICAL EXAM   Temp:  [97.4 F (36.3 C)-98.1 F (36.7 C)] 97.4 F (36.3 C)  Heart Rate:  [69-81] 81  Resp Rate:  [16-18] 16  BP: (94-111)/(51-69) 105/69 mmHg  Temp (24hrs), Avg:97.7 F (36.5 C), Min:97.4 F (36.3  C), Max:98.1 F (36.7 C)      Telemetry: Sinus rhythm, PVC, PAT   General: thin, awake, alert, breathing comfortable, no acute distress  HEENT: normocephalic, PER, anicteric, no pallor or cyanosis.  Mucosa moist.    Cardiovascular: regular rate and rhythm, S1, S2, no S3, frequent ectopies  Neck: no JVD  Lungs: clear to auscultation, diminished BS  Abdomen: bowel sound present  Extremities: no edema, ecchymosis groin area  Pulse: equal pulses,  symmetric  Neurological: Alert and oriented Musculoskeletal:  Scoliosis  Skin:  Warm and dry       Labs:             Recent Labs  Lab 02/28/16  0406   MAGNESIUM 2.2       Recent Labs  Lab 02/28/16  0406   PT 32.5*   PT INR 3.2*           Recent Labs  Lab 02/28/16  0406 02/27/16  0224 02/26/16  1614  SODIUM 134* 137 142   POTASSIUM 4.3 4.5 5.3*   CHLORIDE 105 106 110   CO2 23 21* 25   BUN 27.0* 18.0 19.0   CREATININE 1.2* 1.0 1.0   EGFR 43.0 53.1 53.1   GLUCOSE 94 115* 125*   CALCIUM 7.9 8.1 8.5   Estimated Creatinine Clearance: 32.5 mL/min (based on Cr of 1.2).      Signed by     Lonzo Cloud, MD    [facility]  [facility]    Arrhythmia Spectralink [phone] (8am-4:30pm)  After hours, non urgent consult line [phone]  After Hours, urgent consults [phone]  For EP questions or consults during the week, easiest to call Spectralink [phone]   You can also reach [place] 24/7 through our main EP office [phone].

## 2016-02-28 NOTE — Progress Notes (Signed)
Per Karis Juba PA, hold Coumadin today d/t PT INR of 3.2.

## 2016-02-28 NOTE — Plan of Care (Addendum)
Problem: Safety  Goal: Patient will be free from injury during hospitalization  Outcome: Progressing    Problem: Pain  Goal: Pain at adequate level as identified by patient  Outcome: Progressing    Problem: Hemodynamic Status: Cardiac  Goal: Stable vital signs and fluid balance  Outcome: Progressing    Comments:   Pt A/Ox4, one person standby assist with ambulation to bathroom. Pt on RA with lungs clear diminished; right lower lobe has fine crackles in bases. On tele, pt is NSR, BBB, PACs, PVCs. Pt received 5th dose of tikosyn at start of shift. EKG performed 2 hrs post, showed NSR frequent PACs and PVCs, HR 85, QT/QTC 390/468. On call EP paged, per Dr. Cherly Hensen, okay to continue same dose. 6th dose tikosyn scheduled for 0700 am. Bilateral groin sites remained unchanged with bruising, no oozing noted. Pt complains of soreness at site 1/10, scheduled tylenol given with good effect. Bed placed in lowest position, wheels locked, call light within reach, fall mat placed. Pt denies pain for remainder of shift, currently resting comfortably.    Plan: Monitor electrolytes. Continue EKG's 2 hrs post tikosyn dose. Possible discharge tomorrow.

## 2016-02-29 LAB — BASIC METABOLIC PANEL
BUN: 21 mg/dL — ABNORMAL HIGH (ref 7.0–19.0)
CO2: 22 mEq/L (ref 22–29)
Calcium: 8.2 mg/dL (ref 7.9–10.2)
Chloride: 108 mEq/L (ref 100–111)
Creatinine: 0.9 mg/dL (ref 0.6–1.0)
Glucose: 83 mg/dL (ref 70–100)
Potassium: 4.3 mEq/L (ref 3.5–5.1)
Sodium: 138 mEq/L (ref 136–145)

## 2016-02-29 LAB — GFR: EGFR: 59.9

## 2016-02-29 LAB — ECG 12-LEAD
Atrial Rate: 76 {beats}/min
P Axis: 78 degrees
P-R Interval: 204 ms
Q-T Interval: 390 ms
QRS Duration: 114 ms
QTC Calculation (Bezet): 464 ms
R Axis: -56 degrees
T Axis: 94 degrees
Ventricular Rate: 85 {beats}/min

## 2016-02-29 LAB — MAGNESIUM: Magnesium: 2.2 mg/dL (ref 1.6–2.6)

## 2016-02-29 LAB — PT/INR
PT INR: 1.8 — ABNORMAL HIGH (ref 0.9–1.1)
PT: 20.6 s — ABNORMAL HIGH (ref 12.6–15.0)

## 2016-02-29 NOTE — Progress Notes (Signed)
Pt left unit at 1255pm walking.

## 2016-02-29 NOTE — Plan of Care (Signed)
Pt being discharge home with husband. HL and Telemetry d/ced. Downtime Epic discharge summery given. Tikosyn new medication and other to continue same as per MD. Pt verbalized understanding.

## 2016-03-01 NOTE — Progress Notes (Addendum)
HOME HEALTH LIAISON:  DEFERRAL:    LACE SCORE is 5 - Medicare patient     Patient declines Home Health SN visits - agrees to follow-up with a MD office follow-up visit on Monday, 09/11    E-mail notification sent to Discharge Planner Corinna Gab)    (no PT/OT consults done for this patient during this Hospital stay)    Fara Chute, RN BSN - [phone]

## 2016-03-02 ENCOUNTER — Other Ambulatory Visit: Payer: Self-pay

## 2016-03-02 ENCOUNTER — Telehealth: Payer: Self-pay

## 2016-03-02 LAB — ECG 12-LEAD
Atrial Rate: 0 {beats}/min
Q-T Interval: 388 ms
QRS Duration: 116 ms
QTC Calculation (Bezet): 495 ms
R Axis: 7 degrees
T Axis: 192 degrees
Ventricular Rate: 98 {beats}/min

## 2016-03-02 NOTE — Progress Notes (Signed)
Patient has a follow-up appt with Dr. Pecola Leisure on 03/05/16 @ 1130am.  There is a note that states pt has appt on 03/02/16, doctor office said no such appt.  I scheduled the patient 03/05/16 with office patient is aware.  Patient declined H/H.    Bernestine Amass, [place]  [id number]

## 2016-03-02 NOTE — Telephone Encounter (Signed)
Discharge phone call completed. Doing well, no issues or complaints voiced.       SOB: intermittent, gets better upon rest  Blood Pressure:  Has the machine at home but has not checked yet. Rn reminded  Medications: reviewed, INR check tomorrow  PCP appointment: Thursday at 11:30 AM  Cardiology appt: 03/30/16  Pt reports that she does not feel the need for the [facility] visit yet but took RN phone number in case she feels the need for it.    Jorene Guest, RN, MSN, PCCN-K  Patient Care Navigator, [place]  [phone]

## 2016-03-03 ENCOUNTER — Ambulatory Visit (INDEPENDENT_AMBULATORY_CARE_PROVIDER_SITE_OTHER): Payer: Self-pay

## 2016-03-05 ENCOUNTER — Ambulatory Visit (INDEPENDENT_AMBULATORY_CARE_PROVIDER_SITE_OTHER): Payer: Self-pay

## 2016-03-05 ENCOUNTER — Other Ambulatory Visit: Payer: Self-pay | Admitting: Cardiovascular Disease

## 2016-03-05 ENCOUNTER — Ambulatory Visit: Payer: Medicare Other

## 2016-03-05 ENCOUNTER — Ambulatory Visit (INDEPENDENT_AMBULATORY_CARE_PROVIDER_SITE_OTHER): Payer: Self-pay | Admitting: Physician Assistant

## 2016-03-05 DIAGNOSIS — I4891 Unspecified atrial fibrillation: Secondary | ICD-10-CM

## 2016-03-05 DIAGNOSIS — I4819 Other persistent atrial fibrillation: Secondary | ICD-10-CM

## 2016-03-05 NOTE — Patient Instructions (Signed)
Exam-specific instructions:     Medication and NPO instructions: Pt to follow instructions from [facility] and PST staff.    Clothing instructions: Instructed patient to wear loose, comfortable clothing.      Arrival instructions: Instructed patient to arrive at 1030 via the Patient Entrance.  Instructed patient where the CVIR is located and about parking options.  Pt's husband, Jonny Ruiz, will be her designated driver.    Other reminders: Instructed to bring something to keep occupied in case of any unforeseen delays.

## 2016-03-06 ENCOUNTER — Ambulatory Visit: Payer: Medicare Other | Admitting: Cardiovascular Disease

## 2016-03-06 ENCOUNTER — Other Ambulatory Visit (INDEPENDENT_AMBULATORY_CARE_PROVIDER_SITE_OTHER): Payer: Self-pay

## 2016-03-06 ENCOUNTER — Ambulatory Visit: Payer: Medicare Other | Admitting: Certified Registered"

## 2016-03-06 ENCOUNTER — Ambulatory Visit: Payer: Medicare Other

## 2016-03-06 ENCOUNTER — Ambulatory Visit: Payer: Medicare Other | Attending: Cardiovascular Disease

## 2016-03-06 ENCOUNTER — Ambulatory Visit
Admission: RE | Admit: 2016-03-06 | Discharge: 2016-03-06 | Disposition: A | Discharge status: Home / Self Care | Payer: Medicare Other | Source: Ambulatory Visit | Attending: Cardiovascular Disease | Admitting: Cardiovascular Disease

## 2016-03-06 ENCOUNTER — Inpatient Hospital Stay: Payer: Medicare Other

## 2016-03-06 DIAGNOSIS — I4891 Unspecified atrial fibrillation: Secondary | ICD-10-CM | POA: Insufficient documentation

## 2016-03-06 DIAGNOSIS — Z7901 Long term (current) use of anticoagulants: Secondary | ICD-10-CM | POA: Insufficient documentation

## 2016-03-06 DIAGNOSIS — I080 Rheumatic disorders of both mitral and aortic valves: Secondary | ICD-10-CM | POA: Insufficient documentation

## 2016-03-06 DIAGNOSIS — I480 Paroxysmal atrial fibrillation: Secondary | ICD-10-CM | POA: Diagnosis present

## 2016-03-06 HISTORY — DX: Pneumonia, unspecified organism: J18.9

## 2016-03-06 LAB — PT/INR
PT INR: 1.8 — ABNORMAL HIGH (ref 0.9–1.1)
PT: 21 s — ABNORMAL HIGH (ref 12.6–15.0)

## 2016-03-06 MED ORDER — PHENYLEPHRINE HCL 10 MG/ML IV SOLN (WRAP)
Status: DC | PRN
Start: 2016-03-06 — End: 2016-03-06
  Administered 2016-03-06: 50 ug via INTRAVENOUS
  Administered 2016-03-06 (×2): 100 ug via INTRAVENOUS

## 2016-03-06 MED ORDER — LIDOCAINE HCL (PF) 2 % IJ SOLN
INTRAMUSCULAR | Status: AC
Start: 2016-03-06 — End: ?
  Filled 2016-03-06: qty 5

## 2016-03-06 MED ORDER — SODIUM CHLORIDE 0.9 % IV SOLN
INTRAVENOUS | Status: DC | PRN
Start: 2016-03-06 — End: 2016-03-06

## 2016-03-06 MED ORDER — LIDOCAINE HCL 2 % IJ SOLN
INTRAMUSCULAR | Status: DC | PRN
Start: 2016-03-06 — End: 2016-03-06
  Administered 2016-03-06: 100 mg

## 2016-03-06 MED ORDER — PROPOFOL 10 MG/ML IV EMUL (WRAP)
INTRAVENOUS | Status: AC
Start: 2016-03-06 — End: ?
  Filled 2016-03-06: qty 50

## 2016-03-06 MED ORDER — PROPOFOL 10 MG/ML IV EMUL (WRAP)
INTRAVENOUS | Status: DC | PRN
Start: 2016-03-06 — End: 2016-03-06
  Administered 2016-03-06: 50 mg via INTRAVENOUS

## 2016-03-06 MED ORDER — ATROPINE SULFATE 0.1 MG/ML IJ SOLN (WRAP)
INTRAMUSCULAR | Status: DC
Start: 2016-03-06 — End: 2016-03-06
  Filled 2016-03-06: qty 10

## 2016-03-06 MED ORDER — PROPOFOL INFUSION 10 MG/ML
INTRAVENOUS | Status: DC | PRN
Start: 2016-03-06 — End: 2016-03-06
  Administered 2016-03-06: 140 ug/kg/min via INTRAVENOUS

## 2016-03-06 NOTE — Progress Notes (Signed)
Discharge teaching completed. Written copy provided. Pt able to verbalize understanding of discharge teaching. Peripheral IV discontinued   Pt to continue regular medication regime   Pt to follow up next week with cardiology   Pt alert and oriented. Pt denies pain Vital signs stable Discharge criteria met  Wheelchair escort to lobby. Discharged to home

## 2016-03-06 NOTE — Progress Notes (Signed)
Dr Georgeanna Lea to see patient request additional EKG. [phone]

## 2016-03-06 NOTE — Anesthesia Postprocedure Evaluation (Signed)
Anesthesia Post Evaluation    Patient: [name]    Procedures performed: * No procedures listed *    Anesthesia type: General TIVA    Patient location:PACU    Last vitals:   Vitals:    03/06/16 1215   BP: (!) 84/50   Pulse: (!) 46   Resp:    Temp:    SpO2: 96%       Post pain: Patient not complaining of pain, continue current therapy      Mental Status:awake and alert     Respiratory Function: tolerating room air    Cardiovascular: stable    Nausea/Vomiting: patient not complaining of nausea or vomiting    Hydration Status: adequate    Post assessment: no apparent anesthetic complications    Jennae Hakeem Kabiruddin Curran Lenderman, 03/06/2016 12:30 PM

## 2016-03-06 NOTE — Transfer of Care (Signed)
Anesthesia Transfer of Care Note    Patient: [name]    Procedures performed: * No procedures listed *    Anesthesia type: General TIVA    Patient location:PACU    Last vitals:   Vitals:    03/06/16 1159   BP: 108/56   Pulse: 68   Resp: 15   Temp: 36.1 C (97 F)   SpO2: 99%       Post pain: Patient not complaining of pain, continue current therapy      Mental Status:sedated    Respiratory Function: tolerating nasal cannula    Cardiovascular: stable    Nausea/Vomiting: patient not complaining of nausea or vomiting    Hydration Status: adequate    Post assessment: no apparent anesthetic complications, no reportable events and no evidence of recall    Signed by: Clemens Catholic  03/06/16 12:00 PM

## 2016-03-06 NOTE — Discharge Summary (Signed)
Discharge Summary    Date:03/06/2016   Patient Name: [name], [name]  Attending Physician: No att. providers found    Date of Admission:   02/26/2016    Date of Discharge:   02/29/16    Admitting Diagnosis:   Atrial fibrillation    Discharge Dx:     Principal Diagnosis (Diagnosis after study, that is chiefly responsible for admission to inpatient status): Atrial fibrillation  Active Hospital Problems    Diagnosis POA   . Atrial fibrillation Yes      Resolved Hospital Problems    Diagnosis POA   No resolved problems to display.       Treatment Team:    Dr. Marlou Starks, Dr. Peyton Najjar    Procedures performed:   Surgery: all results from this admission  Procedure(s) with comments:  Ablation - Atrial Fibrillation -T (N/A) - TEE  ICE/CARTO/w dr Amado Coe    Reason for Admission:   Atrial fibrillation  Hospital Course:     Patient underwent catheter ablation for treatment of atrial fibrillation with PVI and FIRM with temporal dispersion and RA isthmus ablation. Patient was loaded with tikosyn and reverted to normal rhythm. She tolerated antiarrhythmic drug loading well    Condition at Discharge:   Fair  Today:     BP 138/74   Pulse 77   Temp 98.3 F (36.8 C) (Oral)   Resp 16   Ht 1.575 m (5\' 2" )   Wt 67 kg (147 lb 9.6 oz)   SpO2 97%   BMI 27.00 kg/m   Ranges for the last 24 hours:       Last set of labs     Recent Labs  Lab 02/29/16  0552   Sodium 138   Potassium 4.3   Chloride 108   CO2 22   BUN 21.0*   Creatinine 0.9   EGFR 59.9   Glucose 83   Calcium 8.2       Micro / Labs / Path pending:     Unresulted Labs     None          Discharge Instructions For Providers     1. Patient to monitor for arrhythmia, palpitations and lightheadedness.           Discharge Instructions:     Follow-up Information     Danford Bad, MD. Call on 03/05/2016.    Why:  appt with Dr. Pecola Leisure, PCP 03/05/16 @ 1130am   Contact information:  [address] [place] [address]  [phone]                   Discharge Diet: Cardiac Diet         Activity/Weight bearing status: No heavy lifting for one week  Disposition:  Home or Self Care     Discharge Medication List      Taking    acetaminophen 500 MG tablet  Dose:  500 mg  Commonly known as:  TYLENOL  Take 500 mg by mouth 3 (three) times daily.     alendronate 70 MG tablet  Dose:  70 mg  Commonly known as:  FOSAMAX  Take 70 mg by mouth every 7 days. Take 1 tab by mouth every 7 days with a full glass of water on an empty stomach. Do not take anything else by mouth or lie down for 30 mins.     ALIGN PO  Dose:  1 tablet  Take 1 tablet by mouth daily.  CALCIUM 600 + MINERALS 600-200 MG-UNIT Tabs  Take by mouth. Take as directed     CoQ10 100 MG Caps  Dose:  2 tablet  Take 2 tablets by mouth every evening.     fluticasone 27.5 MCG/SPRAY nasal spray  Dose:  2 spray  Commonly known as:  VERAMYST  2 sprays by Nasal route daily.     furosemide 20 MG tablet  Dose:  20 mg  Commonly known as:  LASIX  Take 20 mg by mouth every other day.     gabapentin 100 MG capsule  Dose:  100 mg  Commonly known as:  NEURONTIN  Take 100 mg by mouth 3 (three) times daily.     levothyroxine 112 MCG tablet  Dose:  112 mcg  Commonly known as:  SYNTHROID, LEVOTHROID  Take 112 mcg by mouth daily.     lisinopril 5 MG tablet  Dose:  5 mg  Commonly known as:  PRINIVIL,ZESTRIL  Take 5 mg by mouth daily.     Magnesium 250 MG Tabs  Dose:  1 tablet  Take 1 tablet by mouth daily.     MULTI-VITAMINS PO  Dose:  1 tablet  Take 1 tablet by mouth.Without Vit K     simvastatin 10 MG tablet  Commonly known as:  ZOCOR  TAKE ONE TABLET EACH EVENING     VITRON-C PO  Dose:  1 tablet  Take 1 tablet by mouth twice a week.     warfarin 5 MG tablet  Dose:  5 mg  Commonly known as:  COUMADIN  Take 5 mg by mouth every evening.          Minutes spent coordinating discharge and reviewing discharge plan: 30 minutes      Signed by: Ida Rogue, MD

## 2016-03-06 NOTE — Anesthesia Preprocedure Evaluation (Signed)
Anesthesia Evaluation    AIRWAY    Mallampati: II    TM distance: >3 FB  Neck ROM: full  Mouth Opening:full   CARDIOVASCULAR    irregular and normal       DENTAL    no notable dental hx       PULMONARY    decreased breath sounds and bibasilar     OTHER FINDINGS                      Anesthesia Plan    ASA 3     general                     intravenous induction   Detailed anesthesia plan: general IV        Post op pain management: per surgeon    informed consent obtained    Plan discussed with CRNA.

## 2016-03-06 NOTE — Brief Op Note (Signed)
[facility] CARDIOVERSION PROCEDURE NOTE    Date Time: 03/06/16 12:07 PM  Patient Name: [name],[name]  Requesting Physician: Lysle Rubens, MD         See seprate tee structured report    Reason for Cardioversion:   Atrial fibrillation      The nature of the procedure, risks and alternatives were discussed with the patient who understands and  gave informed consent for the procedure.    PROCEDURE:  Electrical cardioversion.    After electrocardiographic monitoring, blood pressure monitoring and pulse oximetry monitoring was established, patient was sedated by anesthesiology.  Anterior, posterior, biphasic, synchronous cardioversion was then performed with 200Joules.      COMPLICATIONS:  Patient was resting comfortably after the completion of the procedure.  There were no complications.     IMPRESSION:  Successful electrical cardioversion.    PLAN:  Discharge to home  No change in medications   Follow up [place] [facility]  No alcohol or driving 24 hours      Signed by: Lysle Rubens, M.D., F.A.C.C.

## 2016-03-06 NOTE — Discharge Instructions (Signed)
[address]  [place], [place] [address]  (phone) [phone]   (fax) [phone]        TRANS-ESOPHAGEAL ECHOCARDIOGRAM DISCHARGE INSTRUCTIONS       PHYSICIAN NAME/NUMBER:      ACTIVITY:  ? Return home and rest the rest of the day  ? Activity as tolerated tomorrow  ? Due to the sedations you received, do not sign any legal documents and do not operate any equipment which requires mental alertness (driving)    DIET:  ? May  resume normal diet  ? Soft if any irritation present  ? Avoid alcohol for 24 hours post procedure.    POST PROCEDURE DISCOMFORT:  ? May gargle with warm water or use over the counter lozenges for sore throat.  ? Notify your doctor is no relief    CALL YOUR DOCTOR IF YOU HAVE:  ? Fever/chills  ? Vomiting bright blood  ? Chest pain  ? Difficulty breathing.    We are open from  the hours of 7 am to 6 pm Monday through Friday. If you attempt to call us after hours with a question or concern and are unable to get a hold of [place], or you feel the problem may be life threatening  call 911 and go to the closest emergency room   I have received and understand these discharge instructions. I have no further questions regarding these instructions .     Patient or Representative     __________________________________________________     [address]  [place], [place] [address]  (phone) [phone]   (fax) [phone]      CARDIOVERSION DISCHARGE INSTRUCTIONS      ACTIVITY:  ? Return home today and rest  ? Activity as tolerated tomorrow    DIET:  ? Normal diet  ? soft if any irritation present    SPECIAL INSTRUCTIONS:  ? Due to sedation given for your procedure, do not sign any legal documents and do not operate any equipment which requires mental alertness (driving)    CALL YOUR DOCTOR IF YOU HAVE:  ? Dizziness/fainting  ? Abnormal heart beat  ? Chest pain, burning or tingling at the paddle site  ? Shortness of breath    MEDICATIONS:  ? Continue current medications unless otherwise stated.      We are open from  the hours of 7 am to 6 pm Monday through Friday. If you attempt to call us after hours with a question or concern and are unable to get a hold of [place], or you feel the problem may be life threatening  call 911 and go to the closest emergency room   I have received and understand these discharge instructions. I have no further questions regarding these instructions .     Patient or Representative     __________________________________________________

## 2016-03-07 LAB — ECG 12-LEAD
Atrial Rate: 70 {beats}/min
Atrial Rate: 71 {beats}/min
P Axis: 83 degrees
P Axis: 86 degrees
P-R Interval: 196 ms
P-R Interval: 206 ms
Q-T Interval: 458 ms
Q-T Interval: 468 ms
QRS Duration: 122 ms
QRS Duration: 122 ms
QTC Calculation (Bezet): 494 ms
QTC Calculation (Bezet): 508 ms
R Axis: -41 degrees
R Axis: -54 degrees
T Axis: 105 degrees
T Axis: 94 degrees
Ventricular Rate: 70 {beats}/min
Ventricular Rate: 71 {beats}/min

## 2016-03-10 ENCOUNTER — Ambulatory Visit (INDEPENDENT_AMBULATORY_CARE_PROVIDER_SITE_OTHER): Payer: Self-pay

## 2016-03-13 ENCOUNTER — Ambulatory Visit (INDEPENDENT_AMBULATORY_CARE_PROVIDER_SITE_OTHER): Payer: Self-pay | Admitting: Interventional Cardiology

## 2016-03-26 ENCOUNTER — Ambulatory Visit (INDEPENDENT_AMBULATORY_CARE_PROVIDER_SITE_OTHER): Payer: Self-pay

## 2016-03-30 ENCOUNTER — Ambulatory Visit (INDEPENDENT_AMBULATORY_CARE_PROVIDER_SITE_OTHER): Payer: Self-pay | Admitting: Physician Assistant

## 2016-03-30 ENCOUNTER — Other Ambulatory Visit (INDEPENDENT_AMBULATORY_CARE_PROVIDER_SITE_OTHER): Payer: Self-pay

## 2016-03-30 ENCOUNTER — Ambulatory Visit (INDEPENDENT_AMBULATORY_CARE_PROVIDER_SITE_OTHER): Payer: Self-pay

## 2016-03-31 ENCOUNTER — Other Ambulatory Visit: Payer: Self-pay | Admitting: Physician Assistant

## 2016-03-31 DIAGNOSIS — I4891 Unspecified atrial fibrillation: Secondary | ICD-10-CM

## 2016-04-06 ENCOUNTER — Ambulatory Visit (INDEPENDENT_AMBULATORY_CARE_PROVIDER_SITE_OTHER): Payer: Self-pay | Admitting: Nurse Practitioner

## 2016-04-06 LAB — VAHRT HISTORIC LVEF: Ejection Fraction: 50 %

## 2016-04-07 ENCOUNTER — Ambulatory Visit
Admission: RE | Admit: 2016-04-07 | Discharge: 2016-04-07 | Disposition: A | Discharge status: Home / Self Care | Payer: Medicare Other | Source: Ambulatory Visit | Attending: Physician Assistant | Admitting: Physician Assistant

## 2016-04-07 DIAGNOSIS — I272 Pulmonary hypertension, unspecified: Secondary | ICD-10-CM | POA: Insufficient documentation

## 2016-04-07 DIAGNOSIS — I5189 Other ill-defined heart diseases: Secondary | ICD-10-CM | POA: Insufficient documentation

## 2016-04-07 DIAGNOSIS — I4891 Unspecified atrial fibrillation: Secondary | ICD-10-CM | POA: Insufficient documentation

## 2016-04-07 DIAGNOSIS — I517 Cardiomegaly: Secondary | ICD-10-CM | POA: Insufficient documentation

## 2016-04-09 ENCOUNTER — Ambulatory Visit (INDEPENDENT_AMBULATORY_CARE_PROVIDER_SITE_OTHER): Payer: Self-pay

## 2016-04-09 ENCOUNTER — Encounter: Payer: Self-pay | Admitting: Cardiovascular Disease

## 2016-04-15 ENCOUNTER — Ambulatory Visit: Payer: Medicare Other

## 2016-04-15 NOTE — Pre-Procedure Instructions (Signed)
   Procedure Verified 10/27?2017   Pt ID verified   NPO p MN reinforced , w read back   Reinforced medication instructions by MD, may take morning meds w sips of water   Arrival time at 0600  w read back, parking at [facility], check in at CATH/EP lab registration, [facility] grd flr    Ride arranged with husband, John   Pt instructed to bring medication list with dosages   Labs to be done Thursday @ [facility]   Instructed to bring overnight bag incase overnight stay becomes necessary

## 2016-04-16 ENCOUNTER — Ambulatory Visit: Payer: Medicare Other | Attending: Cardiology

## 2016-04-16 DIAGNOSIS — Z5181 Encounter for therapeutic drug level monitoring: Secondary | ICD-10-CM | POA: Insufficient documentation

## 2016-04-16 DIAGNOSIS — Z7901 Long term (current) use of anticoagulants: Secondary | ICD-10-CM | POA: Insufficient documentation

## 2016-04-16 LAB — CBC AND DIFFERENTIAL
Absolute NRBC: 0 10*3/uL
Basophils Absolute Automated: 0.01 10*3/uL (ref 0.00–0.20)
Basophils Automated: 0.4 %
Eosinophils Absolute Automated: 0.1 10*3/uL (ref 0.00–0.70)
Eosinophils Automated: 3.5 %
Hematocrit: 38.8 % (ref 37.0–47.0)
Hgb: 12.2 g/dL (ref 12.0–16.0)
Immature Granulocytes Absolute: 0.01 10*3/uL
Immature Granulocytes: 0.4 %
Lymphocytes Absolute Automated: 1.32 10*3/uL (ref 0.50–4.40)
Lymphocytes Automated: 46.3 %
MCH: 31.9 pg (ref 28.0–32.0)
MCHC: 31.4 g/dL — ABNORMAL LOW (ref 32.0–36.0)
MCV: 101.6 fL — ABNORMAL HIGH (ref 80.0–100.0)
MPV: 10.4 fL (ref 9.4–12.3)
Monocytes Absolute Automated: 0.29 10*3/uL (ref 0.00–1.20)
Monocytes: 10.2 %
Neutrophils Absolute: 1.12 10*3/uL — ABNORMAL LOW (ref 1.80–8.10)
Neutrophils: 39.2 %
Nucleated RBC: 0 /100 WBC (ref 0.0–1.0)
Platelets: 155 10*3/uL (ref 140–400)
RBC: 3.82 10*6/uL — ABNORMAL LOW (ref 4.20–5.40)
RDW: 14 % (ref 12–15)
WBC: 2.85 10*3/uL — ABNORMAL LOW (ref 3.50–10.80)

## 2016-04-16 LAB — BASIC METABOLIC PANEL
BUN: 23 mg/dL — ABNORMAL HIGH (ref 7.0–19.0)
CO2: 24 mEq/L (ref 21–30)
Calcium: 9.4 mg/dL (ref 7.9–10.2)
Chloride: 106 mEq/L (ref 100–111)
Creatinine: 1 mg/dL (ref 0.4–1.5)
Glucose: 93 mg/dL (ref 70–100)
Potassium: 3.8 mEq/L (ref 3.5–5.1)
Sodium: 140 mEq/L (ref 135–146)

## 2016-04-16 LAB — PT/INR
PT INR: 1.1 (ref 0.9–1.1)
PT: 14.7 s (ref 12.6–15.0)

## 2016-04-16 LAB — HEMOLYSIS INDEX: Hemolysis Index: 5 (ref 0–18)

## 2016-04-16 LAB — GFR: EGFR: 53.1

## 2016-04-17 ENCOUNTER — Encounter
Admission: RE | Disposition: A | Discharge status: Home / Self Care | Payer: Self-pay | Source: Ambulatory Visit | Attending: Cardiovascular Disease

## 2016-04-17 ENCOUNTER — Ambulatory Visit
Admission: RE | Admit: 2016-04-17 | Discharge: 2016-04-17 | Disposition: A | Discharge status: Home / Self Care | Payer: Medicare Other | Source: Ambulatory Visit | Attending: Cardiovascular Disease | Admitting: Cardiovascular Disease

## 2016-04-17 ENCOUNTER — Ambulatory Visit: Payer: Medicare Other | Admitting: Cardiovascular Disease

## 2016-04-17 DIAGNOSIS — I5023 Acute on chronic systolic (congestive) heart failure: Secondary | ICD-10-CM | POA: Diagnosis present

## 2016-04-17 DIAGNOSIS — Z7901 Long term (current) use of anticoagulants: Secondary | ICD-10-CM | POA: Insufficient documentation

## 2016-04-17 DIAGNOSIS — I420 Dilated cardiomyopathy: Secondary | ICD-10-CM

## 2016-04-17 DIAGNOSIS — D649 Anemia, unspecified: Secondary | ICD-10-CM | POA: Insufficient documentation

## 2016-04-17 DIAGNOSIS — I272 Pulmonary hypertension, unspecified: Secondary | ICD-10-CM | POA: Insufficient documentation

## 2016-04-17 DIAGNOSIS — I429 Cardiomyopathy, unspecified: Secondary | ICD-10-CM | POA: Insufficient documentation

## 2016-04-17 DIAGNOSIS — I081 Rheumatic disorders of both mitral and tricuspid valves: Secondary | ICD-10-CM | POA: Insufficient documentation

## 2016-04-17 DIAGNOSIS — I5033 Acute on chronic diastolic (congestive) heart failure: Secondary | ICD-10-CM | POA: Insufficient documentation

## 2016-04-17 DIAGNOSIS — I481 Persistent atrial fibrillation: Secondary | ICD-10-CM | POA: Insufficient documentation

## 2016-04-17 DIAGNOSIS — J449 Chronic obstructive pulmonary disease, unspecified: Secondary | ICD-10-CM | POA: Insufficient documentation

## 2016-04-17 SURGERY — RIGHT & LEFT HEART CATH  W/ CORONARY ANGIOS, LV/LA
Anesthesia: Conscious Sedation

## 2016-04-17 MED ORDER — NITROGLYCERIN IN D5W 200-5 MCG/ML-% IV SOLN
INTRAVENOUS | Status: AC
Start: 2016-04-17 — End: 2016-04-17
  Administered 2016-04-17: 200 ug via INTRA_ARTERIAL
  Filled 2016-04-17: qty 250

## 2016-04-17 MED ORDER — SODIUM CHLORIDE 0.9 % IV SOLN
INTRAVENOUS | Status: AC
Start: 2016-04-17 — End: 2016-04-17

## 2016-04-17 MED ORDER — SODIUM CHLORIDE 0.9 % IV SOLN
INTRAVENOUS | Status: DC
Start: 2016-04-17 — End: 2016-04-17

## 2016-04-17 MED ORDER — IODIXANOL 320 MG/ML IV SOLN
100.0000 mL | Freq: Once | INTRAVENOUS | Status: AC | PRN
Start: 2016-04-17 — End: 2016-04-17
  Administered 2016-04-17: 100 mL via INTRA_ARTERIAL

## 2016-04-17 MED ORDER — MIDAZOLAM HCL 2 MG/2ML IJ SOLN
INTRAMUSCULAR | Status: AC
Start: 2016-04-17 — End: 2016-04-17
  Administered 2016-04-17: 2 mg via INTRAVENOUS
  Filled 2016-04-17: qty 2

## 2016-04-17 MED ORDER — VERAPAMIL HCL 2.5 MG/ML IV SOLN
INTRAVENOUS | Status: AC
Start: 2016-04-17 — End: 2016-04-17
  Administered 2016-04-17: 2.5 mg via INTRA_ARTERIAL
  Filled 2016-04-17: qty 2

## 2016-04-17 MED ORDER — FENTANYL CITRATE (PF) 50 MCG/ML IJ SOLN (WRAP)
INTRAMUSCULAR | Status: AC
Start: 2016-04-17 — End: 2016-04-17
  Administered 2016-04-17: 50 ug via INTRAVENOUS
  Filled 2016-04-17: qty 2

## 2016-04-17 MED ORDER — LIDOCAINE HCL (PF) 1 % IJ SOLN
INTRAMUSCULAR | Status: AC
Start: 2016-04-17 — End: 2016-04-17
  Administered 2016-04-17: 1 mL via INTRAVENOUS
  Filled 2016-04-17: qty 30

## 2016-04-17 MED ORDER — HEPARIN WASH BOWL 5 UNITS/ML SOLN (CATH LAB)
Status: AC
Start: 2016-04-17 — End: 2016-04-17
  Filled 2016-04-17: qty 2000

## 2016-04-17 MED ORDER — HEPARIN SODIUM (PORCINE) 1000 UNIT/ML IJ SOLN
INTRAMUSCULAR | Status: AC
Start: 2016-04-17 — End: 2016-04-17
  Administered 2016-04-17: 2500 [IU] via INTRAVENOUS
  Filled 2016-04-17: qty 10

## 2016-04-17 NOTE — Progress Notes (Signed)
H&P Update with ASA/MALLAMPATTI     Date Time: 04/17/16 6:48 AM    PROCEDURE   LHC/RHC/PCI    MALLAMPATTI AIRWAY CLASSIFICATION (2)      CLASS I- Full visibility of tonsils, uvula, and soft palate     CLASS II- Visibility of hard and soft palate, upper portion of tonsils and uvula  CLASS III-Soft and hard palate and base of uvula are visible    CLASS IV- Only hard palate visible      ASA PHYSICAL STATUS (3)     ASA 1   HEALTHY PATIENT  ASA 2   MILD SYSTEMIC ILLNESS  ASA 3   SYSTEMIC DISEASE, NOT INCAPACITATING  ASA 4   SEVERE SYSTEMIC DISEASE, IS CONSTANT THREAT TO LIFE  ASA 5   MORIBUND CONDITION, NOT EXPECTED TO LIVE >24 HOURS           IRRESPECTIVE OF PROCEDURE  E           EMERGENCY PROCEDURE       CONCLUSION:     The history and physical currently available in the chart has been reviewed and there are no significant interval changes since prior evaluation.  She has no complaints.  She was seen and examined by me prior to the procedure.  The risks, benefits and alternatives of the procedure have been discussed in detail and she has indicated that she understands the procedure, indications, and risks inherent to the procedure and is amenable to proceeding.  All questions were answered. Informed consent was signed and verified.        Signed by: Kendell Bane, MD

## 2016-04-17 NOTE — Progress Notes (Signed)
PIV and tele removed. Pt left for home via wheelchair with husband, left with all belongings

## 2016-04-17 NOTE — Discharge Instructions (Signed)
Interventional Cardiovascular Admission and Recovery  Catheterization Discharge Instructions            Access Site: Radial Artery    Activity:  1. Do not lift anything greater than five (5) pounds in weight and no strenuous activity for 72 hours.  2. Avoid weight bearing in the affected arm and prevent flexion, extension and manipulation of the wrist area for at least 24 hours.   3. No driving for 48 hours following your procedure.  4. Ask your doctor when you should return to work. Usually you can return within two (2) to three (3) days for desk jobs and within five (5) to seven (7) days for jobs requiring heavy labor.   5. Drink 6-8 glasses of water for at least the next two (2) days to help flush your body of the contrast used during the procedure.    Access Site Care:  1. You may shower 24 hours after your procedure.  Leave the bandage in place and let the water passively flow over the site.  You may shower daily.   2. After 48 hours REMOVE the dressing before or during your shower.  Again, let the water passively flow over the site, wash gently with mild soap and water using your hand, then pat the area dry.    3. Do not submerge access site in water (dishwashing, tub bath, pool, etc) until completely healed (usually five (5) days).   4. Do not rub, pick or scratch the area.   5. Do not apply creams, powders, lotions, or ointments to the site.   6. Apply a regular sized Band-Aid to the puncture site for five (5) days.  Change it daily.   7. Observe for signs of infection:  redness, warmth, swelling, drainage, or temperature greater than 100 degrees F.  If you suspect infection call the doctor who performed the procedure.  8. Monitor for bleeding and swelling. If either occurs, sit or lie down, apply manual pressure directly over the access site for twenty (20) minutes and call the doctor who performed the procedure.     Normal Observation:  1. You may feel tenderness at the puncture site.  May take  ACETAMINOPHEN (TYLENOL) if needed.  May also use ice and elevation for site discomfort.  2. You may experience some mild bruising.      Call 911 if:  1. If you are unable to stop the bleeding with manual pressure. An arterial bleed may become an emergency if left unattended.   2. Your fingers/hand/arm has a loss of normal sensation, becomes cold, numb, painful or grayish in color.   3. You are experiencing unrelieved chest pain.

## 2016-04-17 NOTE — UM Notes (Signed)
04/17/16 0611  Place in Outpatient/Ambulatory Status        RIGHT & LEFT HEART CATH  W/ CORONARY ANGIOS, LV/LA    Cordelia Pen MSN, GCNS-BC, ACM  Utilization Review Case Manager  [employer]  [phone]

## 2016-04-17 NOTE — Progress Notes (Signed)
Spoke to Dr Garlan Fair in lab. Per MD, pt will restart coumadin tomorrow 10/28. Notified pt and husband

## 2016-04-17 NOTE — Op Note (Signed)
Right and Left Heart Catheterization      Procedure:   Right Heart Catheterization     Left Heart Catheterization     Coronary angiography    LV Gram   Dx:   Cardiomyopathy    Left to Right shunt after Trans-septal puncture (ablation)     Access: R brachial vein accessed with ultrasound guidance and micropuncture needle    R radial artery accessed with ultrasound guidance and micropuncture needle  Sedation:       Versed 2 mg IV and Fentanyl 50 mcg IV  Complication: none  EBL:   10-20 mls  Equipment:  12F venous sheath and Swan Ganz catheter     12F arterial sheath, 62F Tig4 cathter, 62F pigtail catheter  Right and Left Heart Catheterization    Patient was brought to the cath lab an prepped and draped in a sterile manner. 1% lidocaine was used to infiltrate to right antecubital fossa and right wrist. We accessed the brachial vein with a micropuncture needle. Ultrasound guidance was used. The right heart catheter was advanced through the venous sheath over a swan wire and right heart catheterization was performed with appropriate recording of hemodynamics and collection of blood for oxygen measurement (SVC, IVC, RA, PA, radial artery). Thermodilution cardiac outputs were performed. This catheter was then removed. We then accessed the R radial artery with ultrasound guidance and a 12F sheath was inserted; we gave IA verapamil 2.5 mg, IA NTG 200 mcg and IV heparin 2500 units. We then passed a TIG4 catheter over a J wire to the aortic root. We performed left and right coronary angiography. We then exchanged the TIG4 for a pigtail over an exchange length rosen wire and we crossed the AV with the pigtail and rosen wire. LV pressures were recorded. LVGram was performed.  All catheter exchanges were done over an exchange length wire and the final catheter was removed over a wire. The patient tolerated the procedure well and there were no complications.     Baseline Measurements:   RA   5 mmHg   RV   50/10 mmHg    PA   50/17 mmHg  and mean 28 mmHg   PCWP  18 mmHg mean    TPG  10 mmHg   LV:  100/12 mm Hg   SVC Sat 69 %   IVC Sat 73 %   RA Sat  82 %   PA Sat  78 %   Art Sat  93 %   Fick CO 7.9 L/min   Fick CI  4.8 L/min/m2   TD CO  5.2 L/min   TD CI  3.2 L/min/m2   PVR  1.3 Wood units with Fick and 2.1 Wood units with TD   Qp  12.6 L/min   Qs  7.9 L/min   Qp:Qs  1.6      Coronary Angiography:   Dominance: right   LM:   Long, mild disease   LAD:  Relatively small, mild disease   Ramus: Large, mild disease   LCx:  Mild disease   RCA:  Large with PDA reaching apex; mild disease     Left Heart Catheterization:   Pullback: no gradient   LV Gram: LVEF 35% with mild mitral regurgitation         Impression:  No significant obstructive coronary artery disease  Mild pulmonary hypertension with mean PAP of 28 mmHg  R to L shunt with Qp:Qs of 1.6  LVEDP of 12 mm Hg  indicates that filling L sided filling pressures are not elevated post diuresis    Recommendations:  Continue to observe  Would only consider closure of defect caused by trans-septal puncture if she has evidence of right heart failure or has new dilation of right sided chambers  Physiologically, the right to left heart shunt is not playing a role in left sided heart failure and her LVEDP presently is 12 mm Hg indicating that she has had a good response to diuretic therapy    Kendell Bane, MD [facility]

## 2016-04-17 NOTE — Progress Notes (Signed)
Pt admitted to room 7 for [facility], ID verified. Alert and oriented, vss, hemodynamically stable. Aldrete =10, denies pain, in no apparent distress. Confirmed procedure and NPO status. IVF infusing, lab results in EPIC, need H&P and consent from Dr Garlan Fair.

## 2016-04-17 NOTE — Progress Notes (Signed)
Consent confirmed, patient seen and examined, [id number] informed.

## 2016-04-17 NOTE — Progress Notes (Signed)
Pt completed bedrest. Vasc band deflated per protocol, pt tolerated well. Ambulated around unit and voided with no difficulty. Discharge instructions reviewed and signed with pt and husband

## 2016-04-17 NOTE — Progress Notes (Signed)
VASC band deflated after 2 hours per protocol, pt tolerated well. Gauze dressing applied. Radial and ulnar pulses remain palpable.

## 2016-04-17 NOTE — Progress Notes (Signed)
PT arrived to [facility] room 16 awake and alert. Able to ambulate to radial chair for recovery. Vitals stable. Pt denies pain. R radial and brachial sites checked, no evidence of bleeding/hematoma. Radial and ulnar pulses palpable. Pt oriented to room and call bell. Aware of arm restrictions Husband at bedside. Pt seen by Dr Garlan Fair at bedside

## 2016-04-22 ENCOUNTER — Ambulatory Visit (INDEPENDENT_AMBULATORY_CARE_PROVIDER_SITE_OTHER): Payer: Self-pay | Admitting: Cardiology

## 2016-04-23 ENCOUNTER — Ambulatory Visit (INDEPENDENT_AMBULATORY_CARE_PROVIDER_SITE_OTHER): Payer: Self-pay

## 2016-04-28 ENCOUNTER — Ambulatory Visit (INDEPENDENT_AMBULATORY_CARE_PROVIDER_SITE_OTHER): Payer: Self-pay

## 2016-05-05 ENCOUNTER — Ambulatory Visit (INDEPENDENT_AMBULATORY_CARE_PROVIDER_SITE_OTHER): Payer: Self-pay

## 2016-05-18 ENCOUNTER — Inpatient Hospital Stay
Admission: EM | Admit: 2016-05-18 | Discharge: 2016-05-27 | DRG: 226 | Disposition: A | Discharge status: Skilled Nursing Facility | Payer: Medicare Other | Attending: Hospitalist | Admitting: Hospitalist

## 2016-05-18 ENCOUNTER — Inpatient Hospital Stay: Payer: Medicare Other | Admitting: Critical Care Medicine

## 2016-05-18 ENCOUNTER — Emergency Department: Payer: Medicare Other

## 2016-05-18 DIAGNOSIS — I340 Nonrheumatic mitral (valve) insufficiency: Secondary | ICD-10-CM | POA: Diagnosis present

## 2016-05-18 DIAGNOSIS — R4182 Altered mental status, unspecified: Secondary | ICD-10-CM

## 2016-05-18 DIAGNOSIS — Z7983 Long term (current) use of bisphosphonates: Secondary | ICD-10-CM

## 2016-05-18 DIAGNOSIS — E039 Hypothyroidism, unspecified: Secondary | ICD-10-CM | POA: Diagnosis present

## 2016-05-18 DIAGNOSIS — I4891 Unspecified atrial fibrillation: Secondary | ICD-10-CM

## 2016-05-18 DIAGNOSIS — G8194 Hemiplegia, unspecified affecting left nondominant side: Secondary | ICD-10-CM | POA: Diagnosis present

## 2016-05-18 DIAGNOSIS — I5022 Chronic systolic (congestive) heart failure: Secondary | ICD-10-CM | POA: Diagnosis present

## 2016-05-18 DIAGNOSIS — Q2112 Patent foramen ovale: Secondary | ICD-10-CM

## 2016-05-18 DIAGNOSIS — I472 Ventricular tachycardia: Principal | ICD-10-CM | POA: Diagnosis present

## 2016-05-18 DIAGNOSIS — W19XXXA Unspecified fall, initial encounter: Secondary | ICD-10-CM | POA: Diagnosis present

## 2016-05-18 DIAGNOSIS — D689 Coagulation defect, unspecified: Secondary | ICD-10-CM | POA: Diagnosis not present

## 2016-05-18 DIAGNOSIS — I639 Cerebral infarction, unspecified: Secondary | ICD-10-CM | POA: Diagnosis present

## 2016-05-18 DIAGNOSIS — D649 Anemia, unspecified: Secondary | ICD-10-CM | POA: Diagnosis not present

## 2016-05-18 DIAGNOSIS — I480 Paroxysmal atrial fibrillation: Secondary | ICD-10-CM | POA: Diagnosis present

## 2016-05-18 DIAGNOSIS — S72142A Displaced intertrochanteric fracture of left femur, initial encounter for closed fracture: Secondary | ICD-10-CM | POA: Diagnosis present

## 2016-05-18 DIAGNOSIS — J449 Chronic obstructive pulmonary disease, unspecified: Secondary | ICD-10-CM | POA: Diagnosis present

## 2016-05-18 DIAGNOSIS — I2721 Secondary pulmonary arterial hypertension: Secondary | ICD-10-CM | POA: Diagnosis present

## 2016-05-18 DIAGNOSIS — I4721 Torsades de pointes: Secondary | ICD-10-CM | POA: Diagnosis present

## 2016-05-18 DIAGNOSIS — S72102A Unspecified trochanteric fracture of left femur, initial encounter for closed fracture: Secondary | ICD-10-CM

## 2016-05-18 DIAGNOSIS — E785 Hyperlipidemia, unspecified: Secondary | ICD-10-CM | POA: Diagnosis present

## 2016-05-18 DIAGNOSIS — I469 Cardiac arrest, cause unspecified: Secondary | ICD-10-CM

## 2016-05-18 DIAGNOSIS — Z8673 Personal history of transient ischemic attack (TIA), and cerebral infarction without residual deficits: Secondary | ICD-10-CM

## 2016-05-18 DIAGNOSIS — Z7901 Long term (current) use of anticoagulants: Secondary | ICD-10-CM

## 2016-05-18 DIAGNOSIS — K219 Gastro-esophageal reflux disease without esophagitis: Secondary | ICD-10-CM | POA: Diagnosis present

## 2016-05-18 DIAGNOSIS — R791 Abnormal coagulation profile: Secondary | ICD-10-CM

## 2016-05-18 DIAGNOSIS — I110 Hypertensive heart disease with heart failure: Secondary | ICD-10-CM | POA: Diagnosis present

## 2016-05-18 DIAGNOSIS — R942 Abnormal results of pulmonary function studies: Secondary | ICD-10-CM | POA: Diagnosis present

## 2016-05-18 DIAGNOSIS — I5023 Acute on chronic systolic (congestive) heart failure: Secondary | ICD-10-CM | POA: Diagnosis present

## 2016-05-18 LAB — COMPREHENSIVE METABOLIC PANEL
ALT: 96 U/L — ABNORMAL HIGH (ref 0–55)
AST (SGOT): 153 U/L — ABNORMAL HIGH (ref 5–34)
Albumin/Globulin Ratio: 1.6 (ref 0.9–2.2)
Albumin: 3.8 g/dL (ref 3.5–5.0)
Alkaline Phosphatase: 86 U/L (ref 37–106)
BUN: 28 mg/dL — ABNORMAL HIGH (ref 7.0–19.0)
Bilirubin, Total: 0.8 mg/dL (ref 0.2–1.2)
CO2: 21 mEq/L — ABNORMAL LOW (ref 22–29)
Calcium: 9.2 mg/dL (ref 7.9–10.2)
Chloride: 106 mEq/L (ref 100–111)
Creatinine: 1.2 mg/dL — ABNORMAL HIGH (ref 0.6–1.0)
Globulin: 2.4 g/dL (ref 2.0–3.6)
Glucose: 193 mg/dL — ABNORMAL HIGH (ref 70–100)
Potassium: 4 mEq/L (ref 3.5–5.1)
Protein, Total: 6.2 g/dL (ref 6.0–8.3)
Sodium: 139 mEq/L (ref 136–145)

## 2016-05-18 LAB — CBC AND DIFFERENTIAL
Absolute NRBC: 0 10*3/uL
Basophils Absolute Automated: 0.02 10*3/uL (ref 0.00–0.20)
Basophils Automated: 0.3 %
Eosinophils Absolute Automated: 0.02 10*3/uL (ref 0.00–0.70)
Eosinophils Automated: 0.3 %
Hematocrit: 38.2 % (ref 37.0–47.0)
Hgb: 12.1 g/dL (ref 12.0–16.0)
Immature Granulocytes Absolute: 0.04 10*3/uL
Immature Granulocytes: 0.5 %
Lymphocytes Absolute Automated: 2.59 10*3/uL (ref 0.50–4.40)
Lymphocytes Automated: 33.1 %
MCH: 31.9 pg (ref 28.0–32.0)
MCHC: 31.7 g/dL — ABNORMAL LOW (ref 32.0–36.0)
MCV: 100.8 fL — ABNORMAL HIGH (ref 80.0–100.0)
MPV: 10.7 fL (ref 9.4–12.3)
Monocytes Absolute Automated: 0.39 10*3/uL (ref 0.00–1.20)
Monocytes: 5 %
Neutrophils Absolute: 4.76 10*3/uL (ref 1.80–8.10)
Neutrophils: 60.8 %
Nucleated RBC: 0 /100 WBC (ref 0.0–1.0)
Platelets: 179 10*3/uL (ref 140–400)
RBC: 3.79 10*6/uL — ABNORMAL LOW (ref 4.20–5.40)
RDW: 14 % (ref 12–15)
WBC: 7.82 10*3/uL (ref 3.50–10.80)

## 2016-05-18 LAB — PT/INR
PT INR: 3.7 — ABNORMAL HIGH (ref 0.9–1.1)
PT: 36.1 s — ABNORMAL HIGH (ref 12.6–15.0)

## 2016-05-18 LAB — GFR: EGFR: 43

## 2016-05-18 LAB — I-STAT TROPONIN: i-STAT Troponin: 0.22 ng/mL — ABNORMAL HIGH (ref 0.00–0.09)

## 2016-05-18 MED ORDER — PROPOFOL INFUSION 10 MG/ML
10.0000 ug/kg/min | INTRAVENOUS | Status: DC
Start: 2016-05-18 — End: 2016-05-19
  Administered 2016-05-18: 10 ug/kg/min via INTRAVENOUS
  Filled 2016-05-18: qty 100

## 2016-05-18 MED ORDER — VECURONIUM BROMIDE 10 MG IV SOLR
INTRAVENOUS | Status: AC
Start: 2016-05-18 — End: 2016-05-19
  Filled 2016-05-18: qty 10

## 2016-05-18 MED ORDER — SUCCIMER 100 MG PO CAPS
10.0000 mg/kg | ORAL_CAPSULE | Freq: Three times a day (TID) | ORAL | Status: DC
Start: 2016-05-18 — End: 2016-05-18

## 2016-05-18 MED ORDER — VECURONIUM BROMIDE 10 MG IV SOLR
10.0000 mg | Freq: Once | INTRAVENOUS | Status: DC
Start: 2016-05-18 — End: 2016-05-19

## 2016-05-18 MED ORDER — SUCCINYLCHOLINE CHLORIDE 20 MG/ML IJ SOLN
100.0000 mg | Freq: Once | INTRAMUSCULAR | Status: AC
Start: 2016-05-18 — End: 2016-05-18
  Administered 2016-05-18: 100 mg via INTRAVENOUS

## 2016-05-18 MED ORDER — ETOMIDATE 2 MG/ML IV SOLN
20.0000 mg | Freq: Once | INTRAVENOUS | Status: AC
Start: 2016-05-18 — End: 2016-05-18
  Administered 2016-05-18: 20 mg via INTRAVENOUS

## 2016-05-18 NOTE — ED Notes (Signed)
Xray bedside at this time

## 2016-05-18 NOTE — ED Notes (Signed)
1x2cm hematoma noted on left side of forehead.

## 2016-05-18 NOTE — ED Notes (Signed)
Intubation in progress.

## 2016-05-18 NOTE — Progress Notes (Signed)
SW provided emotional support to pt's husband in consult room.  SW to continue to follow and bring pt's husband back to pt's room when appropriate.    Mliss Fritz, MSW, [place]  Emergency Department Social Worker  Case Management  [phone]  [email or web address]

## 2016-05-18 NOTE — ED Notes (Signed)
Troponin: 0.22

## 2016-05-18 NOTE — ED Notes (Signed)
Bed: S 21  Expected date:   Expected time:   Means of arrival: [id number] - [place]  Comments:  [facility]

## 2016-05-18 NOTE — ED Provider Notes (Signed)
Physician/Midlevel provider first contact with patient: 05/18/16 2249          [facility] EMERGENCY DEPARTMENT H&P      Visit date: 05/18/2016      CLINICAL SUMMARY          Diagnosis:    .     Final diagnoses:   Hemiparesis of left nondominant side, unspecified hemiparesis etiology   Atrial fibrillation with RVR   Elevated INR   Altered mental status, unspecified altered mental status type         MDM Notes:      I am the first provider for this patient.  I reviewed the vital signs, nursing notes, past medical history, past surgical history, family history and social history.  I have reviewed the patient's previous charts.         Disposition:         Inpatient Admit      ED Disposition     ED Disposition Condition Date/Time Comment    Admit  Tue May 19, 2016 12:58 AM Admitting Physician: Hulan Fess [[id number]]   Diagnosis: Ischemic embolic stroke [[id number]]   Estimated Length of Stay: 3 - 5 Days   Tentative Discharge Plan?: Home or Self Care [1]   Patient Class: Inpatient [101]           CASE ACUITY SUMMARY        CNS Altered mental status/Coma GCS: 11. Requiring intubation.               CLINICAL INFORMATION        HPI:      Chief Complaint: Unresponsive  .    [name] is a 82 Youngo. female BIBA who presents with unresponsiveness.   She and her husband were at rest at home when she walked into another room. Husband became concerned and checked on her 20 minutes later when he reportedly found her lying on the ground and breathing but unresponsive.   Husband is unsure of exact down time.     She had 1x NBNB emesis this afternoon while husband was out of the house but reportedly did not complain of CP, dizziness/HA, abdominal pain, or fever to husband. She was otherwise at baseline health and mentation at home prior to unresponsive episode tonight.   She had decreased appetite in the afternoon but was able to tolerate fluids.     En route, EMS cardioverted 2 times in response to suspected vtach noted on  cardiac strip after which heart rate reportedly normalized.     She is presently more active in exam than she was when husband found her at home. She is not alert or verbally responsive to painful stimuli, unchanged from mentation state for EMS, changed from baseline per husband.   Blood pressure never depressed en route per EMS.     PMH CHF, AVD, HLD, HTN, AVD, afib for which she is regularly on coumadin.  Hx ablation in 07/2015 and cardiac catheterization in 02/2016 for afib exacerbation.     History obtained from: EMS, spouse  Caveat: HPI caveat invoked due to patient's mental status.          ROS:      Positive and Negative ROS elements as per HPI  All Other Systems Reviewed and Negative: Yes      Physical Exam:      Pulse (!) 113  BP (!) 173/93  Resp (!) 23  SpO2 99 %  Temp 97.5 F (  36.4 C)    Physical Exam   Nursing note and vitals reviewed.  Constitutional: Pt appears well-developed and well-nourished. Moaning, non-verbal (new per husband).   HEENT:   Head: Normocephalic. 1x2 cm hematoma of left forehead. Old 1x1 cm area of ecchymosis to right forehead.   Right Ear: External ear normal.   Left Ear: External ear normal.   Nose: Nose normal.   Mouth/Throat: Oropharynx is clear and moist. No oropharyngeal exudate.   Eyes: Conjunctivae and EOM are normal. Pupils are equal, round, and reactive to light. Right eye exhibits no discharge. Left eye exhibits no discharge. No scleral icterus.   Neck: Normal range of motion. Neck supple. No JVD present. No tracheal deviation present. No thyromegaly present.   Cardiovascular: Normal heart sounds and intact distal pulses.  Exam reveals no gallop and no friction rub. Irregularly irregular rhythm.   No murmur heard.  Pulmonary/Chest: Effort normal and breath sounds normal. No stridor. No respiratory distress. Pt has no wheezes. Pt has no rales. Pt exhibits no tenderness.   Abdominal: There is no tenderness. Soft. Bowel sounds are normal. Pt exhibits no distension and no  mass.  There is no rebound and no guarding.   Musculoskeletal:  No edema. FROM  Neurological:  Pt exhibits normal muscle tone. Moving right upper and lower extremities. Decreased movement secondary to painful stimuli of left upper and lower extremities.   Skin: Skin is warm and dry. Pt is not diaphoretic.   Psychiatric: Pt has a normal mood and affect. Behavior is normal. Judgment and thought content normal            PAST HISTORY        Primary Care Provider: Danford Bad, MD        PMH/PSH:    .     Past Medical History:   Diagnosis Date   . Abnormal vision     Dry eyes; marginal vision L eye   . Abnormal vision     Wears glasses   . Anemia     controlled on meds   . Aortic valve insufficiency    . Arrhythmia     Atrial fibrillation   . Arthritis     osteoarthritis, back and pelvis   . AVD (aortic valve disease)    . Benign heart murmur    . Bilateral cataracts     surgery, right eye   . Celiac disease    . Chronic obstructive pulmonary disease     mild-mod   . Difficulty walking     Back pain;    . Disease of lung    . Disorder of musculoskeletal system ?    ?   Marland Kitchen Dyspnea on exertion    . Ear, nose and throat disorder     Hoarseness   . Encounter for cardioversion procedure 10/31/2013   . Gastroesophageal reflux disease    . GERD (gastroesophageal reflux disease)     occasional , asymptomatic   . Headache     infrequently   . Heart murmur    . Hyperlipidemia     controlled with meds   . Hypertension     controlled w/meds   . Hypothyroidism     controlled with meds   . Low back pain    . Mitral valve insufficiency    . Mitral valve prolapse    . Orthopedic aftercare     scoliosis, stenosis, athritis   . Osteoporosis    . Pneumonia  had pneumonia shot 2010 and 2015   . Primary cardiomyopathy    . Scoliosis    . Shortness of breath    . Thyroid disease    . Vein disorder     bilateral legs       She has a past surgical history that includes Knee surgery (Left, 2002); Tonsillectomy and adenoidectomy; Cataract  extraction w/ intraocular lens implant (Right, 2001); Tonsillectomy; Colonoscopy; Adenoidectomy; Appendectomy; Cardiac catheterization (2007); Joint replacement (Right, 10-15-15); Eye surgery; Cardioversion; and Cardiac catheterization (2014).      Social/Family History:      She reports that she has never smoked. She has never used smokeless tobacco. She reports that she drinks alcohol. She reports that she does not use drugs.    Family History   Problem Relation Age of Onset   . Pneumonia Mother    . Heart failure Father    . Hypertension Father    . Colon cancer Neg Hx    . Malignant hyperthermia Neg Hx    . Pseudochol deficiency Neg Hx    . Anesthesia problems Neg Hx          Listed Medications on Arrival:    .     Home Medications     Med List Status:  In Progress Set By: Deliah Boston, RN at 05/18/2016 11:40 PM                acetaminophen (TYLENOL) 500 MG chewable tablet     Chew 500 mg by mouth 3 (three) times daily.     alendronate (FOSAMAX) 70 MG/75ML solution     Take 70 mg by mouth every 7 days.Take in the morning with a full glass of water, on an empty stomach, and do not take anything else by mouth or lie down for the next 30 min.     calcium-vitamin D (OSCAL-500 + D) 500-200 MG-UNIT per tablet     Take 1 tablet by mouth daily.     co-enzyme Q-10 30 MG capsule     Take 30 mg by mouth daily.     dofetilide (TIKOSYN) 250 MCG capsule     TAKE ONE CAPSULE TWO TIMES A DAY BY MOUTH     fluticasone (VERAMYST) 27.5 MCG/SPRAY nasal spray     2 sprays by Nasal route daily.     furosemide (LASIX) 20 MG tablet     Take 20 mg by mouth 2 (two) times daily.     gabapentin (NEURONTIN) 100 MG capsule     Take 100 mg by mouth 3 (three) times daily.     iron-vitamin C (VITRON-C) 65-125 MG Tab     Take 1 tablet by mouth every tues and thurs.     levothyroxine (SYNTHROID, LEVOTHROID) 112 MCG tablet     Take 112 mcg by mouth Once a day at 6:00am.     lisinopril (PRINIVIL,ZESTRIL) 5 MG tablet     Take 5 mg by mouth daily.      magnesium oxide (MAG-OX) 400 MG tablet     Take 400 mg by mouth daily.     Multiple Vitamin (MULTIVITAMIN) tablet     Take 1 tablet by mouth daily.     Probiotic Product (ALIGN) 4 MG Cap     Take by mouth.     simvastatin (ZOCOR) 10 MG tablet     Take 10 mg by mouth nightly.     warfarin (COUMADIN) 5 MG tablet     Take 5  mg by mouth daily.         Allergies: She is allergic to erythromycin and gluten meal.            VISIT INFORMATION        Clinical Course in the ED:      10:59 PM  Stroke RN consulted, reports that patient is possibly a TPA candidate as neuro assessment is presently unable to be performed due to patient mental status.     11:07 PM  20 etomidate and 100 suc administered.     12:10 AM  Stroke RN has consulted with neurology who reports that patient is not a TPA candidate secondary to imaging and elevated INR. INR is presently 3.7.     12:22 AM  Discussed patient case with neuro intensivist who will admit after IR clears.     12:36 AM  Stroke RN d/w neurologist who reports that CTA head results are unremarkable for ICH. Head CT is unremarkable for ICH.       Medications Given in the ED:    .     ED Medication Orders     Start Ordered     Status Ordering Provider    05/18/16 2350 05/18/16 2350  vecuronium (NORCURON) injection 10 mg  Once     Route: Intravenous  Ordered Dose: 10 mg     Last MAR action:  Not Given Merry Proud, [facility]    05/18/16 2304 05/18/16 2303  succinylcholine (ANECTINE) injection 100 mg  Once     Route: Intravenous  Ordered Dose: 100 mg     Last MAR action:  Given Jovon Winterhalter, [facility]    05/18/16 2302 05/18/16 2301  etomidate (AMIDATE) injection 20 mg  Once     Route: Intravenous  Ordered Dose: 20 mg     Last MAR action:  Given Honora Searson, [facility]    05/18/16 2302 05/18/16 2301    Every 8 hours     Route: Oral  Ordered Dose: 10 mg/kg     Discontinued Garan Frappier, [facility]    05/18/16 2302 05/18/16 2301  propofol (DIPRIVAN) 10 mg/mL infusion (ADULT)  Continuous     Route: Intravenous  Ordered  Dose: 10-80 mcg/kg/min     Last MAR action:  Not Given Arlana Lindau            Procedures:      Intubation  Date/Time: 05/18/2016 11:07 PM  Performed by: Arlana Lindau  Authorized by: Arlana Lindau     Consent:     Consent obtained:  Emergent situation  Pre-procedure details:     Patient status:  Altered mental status    Paralytics:  Succinylcholine  Procedure details:     Preoxygenation:  Nasal cannula    CPR in progress: no      Intubation method:  Oral    Oral intubation technique:  Lighted stylet    Tube size (mm):  7.5    Tube type:  Cuffed    Number of attempts:  1    Ventilation between attempts: no    Placement assessment:     ETT to lip:  23    Tube secured with:  Adhesive tape    Breath sounds:  Equal    Placement verification: condensation, CXR verification and equal breath sounds      CXR findings:  ETT in proper place  Post-procedure details:     Patient tolerance of procedure:  Tolerated well, no immediate complications  Interpretations:      Pulse ox reviewed by me. All ordered labs and images reviewed by me.     O2 sat-           saturation: 99 %; Oxygen use: 4L NC; Interpretation: Improved with intervention.     Radiology -     interpreted by me with the following observations: CXR showed no acute disease or pleural effusions.     EKG -             interpreted by me: Atrial fibrillation rate 113 with PVCs. RBBB.     Monitor -         interpreted by me: atrial fibrillation at 112.    Critical Care:     Time spent on critical care 30-74 min.    Stroke/TIA -     Reason for not initiating tPA (alteplase) within 0-3 hrs of onset of symptoms: Evidence of intracranial hemorrhage on pretreatment evaluation and or post head CT scan     Head CT/trauma - GCS: 11   Age 88 or older, Focal neuro deficit, Coagulopathy/thrombocytopenia/anti-coagulants and Dangerous mechanism      Critical Care Time (not including procedures): 30-74 minutes.   Due to the high risk of critical illness or  multi-organ failure at initial presentation and/or during ED course.    System(s) at risk for compromise:  circulatory, respiratory and CNS  Critical Diagnosis:   1. Altered mental status, unspecified altered mental status type    2. Hemiparesis of left nondominant side, unspecified hemiparesis etiology    3. Atrial fibrillation with RVR    4. Elevated INR         The patient was Hypotensive:   No     The patient was Hypoxic:   Yes   This does not including time spent performing other reported procedures or services.   Critical care time involved full attention to the patient's condition and included:   Review of nursing notes and/or old charts - Yes  Documentation time - Yes  Care, transfer of care, and discharge plans - Yes  Obtaining necessary history from family, EMS, nursing home staff and/or treating physicians - Yes  Review of medications, allergies, and vital signs - Yes   Consultant collaboration on findings and treatment options - Yes  Ordering, interpreting, and reviewing diagnostic studies/tab tests - Yes               RESULTS        Lab Results:    Results     Procedure Component Value Units Date/Time    i-Stat CG4 Arterial CartrIDge [[id number]]  (Abnormal) Collected:  05/19/16 0019     Updated:  05/19/16 0033     i-STAT pH Arterial 7.299 (L)     i-STAT pCO2 Arterial 40.5     i-STAT pO2 Arterial 517.0 (H)     i-STAT HCO3 Bicarbonate Arterial 20.0 (L) mEq/L      i-STAT Total CO2 Arterial 21.0 (L) mEq/L      i-STAT Base Excess Arterial -6.0 (L) mEq/L      i-STAT O2 Saturation Arterial 100.0 %      i-STAT Lactic acid 1.2 mmol/L      i-STAT Patient Temperature 97.5     i-STAT FIO2 100     i-STAT O2 Delivery Vent     i-STAT Allen's Test NA     i-STAT Draw Site R Brachial     i-STAT Mode PRVC     i-STAT  Set Rate 14 BPM      i-STAT Peep 5 cmH2O      i-STAT Vent Volume 440    Protime - INR [[id number]]  (Abnormal) Collected:  05/18/16 2307    Specimen:  Blood Updated:  05/18/16 2358     PT 36.1 (H) sec       PT INR 3.7 (H)     PT Anticoag. Given Within 48 hrs. warfarin ([place]    Comprehensive Metabolic Panel (CMP) [[id number]]  (Abnormal) Collected:  05/18/16 2307    Specimen:  Blood Updated:  05/18/16 2334     Glucose 193 (H) mg/dL      BUN 78.2 (H) mg/dL      Creatinine 1.2 (H) mg/dL      Sodium 956 mEq/L      Potassium 4.0 mEq/L      Chloride 106 mEq/L      CO2 21 (L) mEq/L      Calcium 9.2 mg/dL      Protein, Total 6.2 g/dL      Albumin 3.8 g/dL      AST (SGOT) 213 (H) U/L      ALT 96 (H) U/L      Alkaline Phosphatase 86 U/L      Bilirubin, Total 0.8 mg/dL      Globulin 2.4 g/dL      Albumin/Globulin Ratio 1.6    GFR [[id number]] Collected:  05/18/16 2307     Updated:  05/18/16 2334     EGFR 43.0    CBC with Differential [[id number]]  (Abnormal) Collected:  05/18/16 2307    Specimen:  Blood from Blood Updated:  05/18/16 2329     WBC 7.82 x10 3/uL      Hgb 12.1 g/dL      Hematocrit 24.4 %      Platelets 179 x10 3/uL      RBC 3.79 (L) x10 6/uL      MCV 100.8 (H) fL      MCH 31.9 pg      MCHC 31.7 (L) g/dL      RDW 14 %      MPV 10.7 fL      Neutrophils 60.8 %      Lymphocytes Automated 33.1 %      Monocytes 5.0 %      Eosinophils Automated 0.3 %      Basophils Automated 0.3 %      Immature Granulocyte 0.5 %      Nucleated RBC 0.0 /100 WBC      Neutrophils Absolute 4.76 x10 3/uL      Abs Lymph Automated 2.59 x10 3/uL      Abs Mono Automated 0.39 x10 3/uL      Abs Eos Automated 0.02 x10 3/uL      Absolute Baso Automated 0.02 x10 3/uL      Absolute Immature Granulocyte 0.04 x10 3/uL      Absolute NRBC 0.00 x10 3/uL     i-Stat Troponin [[id number]]  (Abnormal) Collected:  05/18/16 2249     Updated:  05/18/16 2313     i-STAT Troponin 0.22 (H) ng/mL               Radiology Results:      Chest AP Portable   Final Result      No acute abnormality.      Nonacute findings detailed above.      Johnsie Kindred, MD    05/18/2016 11:56 PM  CT Head without Contrast   Final Result      No acute intracranial abnormality.      If  continued clinical concern for acute stroke, MRI is more sensitive.      These findings were discussed with Dr. Arlana Lindau at 05/18/2016   11:40 PM.      Johnsie Kindred, MD    05/18/2016 11:40 PM         CT Angiogram Head Neck    (Results Pending)   CT Head W Contrast    (Results Pending)   Echocardiogram Adult Complete W Clr/ Dopp Waveform    (Results Pending)               Scribe Attestation:      I was acting as a Neurosurgeon for Arlana Lindau, MD on Mccamy,Joee Y  Treatment Team: Scribe: Lincoln Brigham   I am the first provider for this patient and I personally performed the services documented. Treatment Team: Scribe: Lincoln Brigham is scribing for me on Bonano,[name]. This note accurately reflects work and decisions made by me.  Arlana Lindau, MD            Arlana Lindau, MD  05/19/16 [phone]

## 2016-05-18 NOTE — ED Notes (Signed)
Pt to be intubated for airway control for pt mental status and for CT. Husband bedside and verbalized understanding.

## 2016-05-19 ENCOUNTER — Inpatient Hospital Stay: Payer: Medicare Other

## 2016-05-19 ENCOUNTER — Other Ambulatory Visit (INDEPENDENT_AMBULATORY_CARE_PROVIDER_SITE_OTHER): Payer: Self-pay

## 2016-05-19 ENCOUNTER — Ambulatory Visit (INDEPENDENT_AMBULATORY_CARE_PROVIDER_SITE_OTHER): Payer: Self-pay

## 2016-05-19 DIAGNOSIS — I639 Cerebral infarction, unspecified: Secondary | ICD-10-CM | POA: Diagnosis present

## 2016-05-19 DIAGNOSIS — I2721 Secondary pulmonary arterial hypertension: Secondary | ICD-10-CM | POA: Diagnosis present

## 2016-05-19 DIAGNOSIS — R942 Abnormal results of pulmonary function studies: Secondary | ICD-10-CM | POA: Diagnosis present

## 2016-05-19 DIAGNOSIS — I482 Chronic atrial fibrillation: Secondary | ICD-10-CM

## 2016-05-19 DIAGNOSIS — Q2112 Patent foramen ovale: Secondary | ICD-10-CM

## 2016-05-19 DIAGNOSIS — I5022 Chronic systolic (congestive) heart failure: Secondary | ICD-10-CM | POA: Diagnosis present

## 2016-05-19 DIAGNOSIS — E039 Hypothyroidism, unspecified: Secondary | ICD-10-CM | POA: Diagnosis present

## 2016-05-19 DIAGNOSIS — J9600 Acute respiratory failure, unspecified whether with hypoxia or hypercapnia: Secondary | ICD-10-CM

## 2016-05-19 DIAGNOSIS — I340 Nonrheumatic mitral (valve) insufficiency: Secondary | ICD-10-CM | POA: Diagnosis present

## 2016-05-19 DIAGNOSIS — J9690 Respiratory failure, unspecified, unspecified whether with hypoxia or hypercapnia: Secondary | ICD-10-CM | POA: Insufficient documentation

## 2016-05-19 DIAGNOSIS — Q211 Atrial septal defect: Secondary | ICD-10-CM

## 2016-05-19 DIAGNOSIS — I4891 Unspecified atrial fibrillation: Secondary | ICD-10-CM

## 2016-05-19 DIAGNOSIS — R4182 Altered mental status, unspecified: Secondary | ICD-10-CM

## 2016-05-19 LAB — PT AND APTT
PT INR: 4.2 — ABNORMAL HIGH (ref 0.9–1.1)
PT: 39.5 s — ABNORMAL HIGH (ref 12.6–15.0)
PTT: 84 s — ABNORMAL HIGH (ref 23–37)

## 2016-05-19 LAB — ECG 12-LEAD
Atrial Rate: 113 {beats}/min
Atrial Rate: 77 {beats}/min
P Axis: 83 degrees
P-R Interval: 158 ms
P-R Interval: 180 ms
Q-T Interval: 398 ms
Q-T Interval: 478 ms
QRS Duration: 138 ms
QRS Duration: 118 ms
QTC Calculation (Bezet): 545 ms
QTC Calculation (Bezet): 540 ms
R Axis: -66 degrees
R Axis: -69 degrees
T Axis: 106 degrees
T Axis: 154 degrees
Ventricular Rate: 113 {beats}/min
Ventricular Rate: 77 {beats}/min

## 2016-05-19 LAB — BASIC METABOLIC PANEL
BUN: 22 mg/dL — ABNORMAL HIGH (ref 7.0–19.0)
CO2: 20 mEq/L — ABNORMAL LOW (ref 22–29)
Calcium: 7.2 mg/dL — ABNORMAL LOW (ref 7.9–10.2)
Chloride: 113 mEq/L — ABNORMAL HIGH (ref 100–111)
Creatinine: 0.8 mg/dL (ref 0.6–1.0)
Glucose: 134 mg/dL — ABNORMAL HIGH (ref 70–100)
Potassium: 3.7 mEq/L (ref 3.5–5.1)
Sodium: 141 mEq/L (ref 136–145)

## 2016-05-19 LAB — I-STAT CG4 ARTERIAL CARTRIDGE
Lactic Acid I-Stat: 1.2 mmol/L (ref 0.2–2.0)
i-STAT Base Excess Arterial: -6 mEq/L — ABNORMAL LOW (ref <=2.0)
i-STAT FIO2: 100
i-STAT HCO3 Bicarbonate Arterial: 20 mEq/L — ABNORMAL LOW (ref 23.0–29.0)
i-STAT O2 Saturation Arterial: 100 % (ref 95.0–100.0)
i-STAT Patient Temperature: 97.5
i-STAT Peep: 5 cmH2O
i-STAT Set Rate: 14 {beats}/min
i-STAT Total CO2 Arterial: 21 mEq/L — ABNORMAL LOW (ref 24.0–30.0)
i-STAT Vent Volume: 440
i-STAT pCO2 Arterial: 40.5 (ref 35.0–45.0)
i-STAT pH Arterial: 7.299 — ABNORMAL LOW (ref 7.350–7.450)
i-STAT pO2 Arterial: 517 — ABNORMAL HIGH (ref 80.0–90.0)

## 2016-05-19 LAB — HEPATIC FUNCTION PANEL
ALT: 98 U/L — ABNORMAL HIGH (ref 0–55)
AST (SGOT): 163 U/L — ABNORMAL HIGH (ref 5–34)
Albumin/Globulin Ratio: 1.8 (ref 0.9–2.2)
Albumin: 3 g/dL — ABNORMAL LOW (ref 3.5–5.0)
Alkaline Phosphatase: 76 U/L (ref 37–106)
Bilirubin Direct: 0.4 mg/dL (ref 0.0–0.5)
Bilirubin Indirect: 0.3 mg/dL (ref 0.0–1.1)
Bilirubin, Total: 0.7 mg/dL (ref 0.2–1.2)
Globulin: 1.7 g/dL — ABNORMAL LOW (ref 2.0–3.6)
Protein, Total: 4.7 g/dL — ABNORMAL LOW (ref 6.0–8.3)

## 2016-05-19 LAB — LIPID PANEL
Cholesterol / HDL Ratio: 2.1
Cholesterol: 123 mg/dL (ref 0–199)
HDL: 59 mg/dL (ref 40–9999)
LDL Calculated: 50 mg/dL (ref 0–99)
Triglycerides: 69 mg/dL (ref 34–149)
VLDL Calculated: 14 mg/dL (ref 10–40)

## 2016-05-19 LAB — PHOSPHORUS
Phosphorus: 4.4 mg/dL (ref 2.3–4.7)
Phosphorus: 3.1 mg/dL (ref 2.3–4.7)

## 2016-05-19 LAB — CBC
Absolute NRBC: 0 10*3/uL
Hematocrit: 30 % — ABNORMAL LOW (ref 37.0–47.0)
Hgb: 9.5 g/dL — ABNORMAL LOW (ref 12.0–16.0)
MCH: 32.1 pg — ABNORMAL HIGH (ref 28.0–32.0)
MCHC: 31.7 g/dL — ABNORMAL LOW (ref 32.0–36.0)
MCV: 101.4 fL — ABNORMAL HIGH (ref 80.0–100.0)
MPV: 10.8 fL (ref 9.4–12.3)
Nucleated RBC: 0 /100 WBC (ref 0.0–1.0)
Platelets: 146 10*3/uL (ref 140–400)
RBC: 2.96 10*6/uL — ABNORMAL LOW (ref 4.20–5.40)
RDW: 14 % (ref 12–15)
WBC: 8.2 10*3/uL (ref 3.50–10.80)

## 2016-05-19 LAB — TYPE AND SCREEN
AB Screen Gel: NEGATIVE
ABO Rh: A POS

## 2016-05-19 LAB — MAGNESIUM
Magnesium: 2.1 mg/dL (ref 1.6–2.6)
Magnesium: 1.6 mg/dL (ref 1.6–2.6)

## 2016-05-19 LAB — T4, FREE: T4 Free: 1.02 ng/dL (ref 0.70–1.48)

## 2016-05-19 LAB — TSH: TSH: 0.09 u[IU]/mL — ABNORMAL LOW (ref 0.35–4.94)

## 2016-05-19 LAB — TROPONIN I
Troponin I: 0.24 ng/mL — ABNORMAL HIGH (ref 0.00–0.09)
Troponin I: 0.24 ng/mL — ABNORMAL HIGH (ref 0.00–0.09)

## 2016-05-19 LAB — VITAMIN B12: Vitamin B-12: 678 pg/mL (ref 211–911)

## 2016-05-19 LAB — HEMOLYSIS INDEX
Hemolysis Index: 11 (ref 0–18)
Hemolysis Index: 14 (ref 0–18)

## 2016-05-19 LAB — PT/INR
PT INR: 4.2 — ABNORMAL HIGH (ref 0.9–1.1)
PT: 40 s — ABNORMAL HIGH (ref 12.6–15.0)

## 2016-05-19 LAB — GFR: EGFR: >60

## 2016-05-19 LAB — T3: T3: 0.44 ng/mL — ABNORMAL LOW (ref 0.48–1.59)

## 2016-05-19 MED ORDER — HYDRALAZINE HCL 20 MG/ML IJ SOLN
10.0000 mg | INTRAMUSCULAR | Status: DC | PRN
Start: 2016-05-19 — End: 2016-05-27

## 2016-05-19 MED ORDER — SODIUM CHLORIDE 0.9 % IV SOLN
INTRAVENOUS | Status: DC
Start: 2016-05-19 — End: 2016-05-22

## 2016-05-19 MED ORDER — ONDANSETRON HCL 4 MG/2ML IJ SOLN
4.0000 mg | Freq: Four times a day (QID) | INTRAMUSCULAR | Status: DC | PRN
Start: 2016-05-19 — End: 2016-05-27

## 2016-05-19 MED ORDER — AQUADEKS PO CHEW
1.0000 | CHEWABLE_TABLET | Freq: Every day | ORAL | Status: DC
Start: 2016-05-19 — End: 2016-05-27
  Administered 2016-05-19 – 2016-05-27 (×8): 1 via OROGASTRIC
  Filled 2016-05-19 (×9): qty 1

## 2016-05-19 MED ORDER — DOFETILIDE 250 MCG PO CAPS
250.0000 ug | ORAL_CAPSULE | Freq: Two times a day (BID) | ORAL | Status: DC
Start: 2016-05-19 — End: 2016-05-19
  Filled 2016-05-19 (×6): qty 1

## 2016-05-19 MED ORDER — ACETAMINOPHEN 325 MG PO TABS
650.0000 mg | ORAL_TABLET | ORAL | Status: DC | PRN
Start: 2016-05-19 — End: 2016-05-27
  Administered 2016-05-19 – 2016-05-26 (×15): 650 mg via OROGASTRIC
  Filled 2016-05-19 (×15): qty 2

## 2016-05-19 MED ORDER — PHYTONADIONE 5 MG PO TABS
10.0000 mg | ORAL_TABLET | Freq: Once | ORAL | Status: AC
Start: 2016-05-19 — End: 2016-05-19
  Administered 2016-05-19: 10 mg via ORAL
  Filled 2016-05-19: qty 2

## 2016-05-19 MED ORDER — LABETALOL HCL 5 MG/ML IV SOLN
10.0000 mg | INTRAVENOUS | Status: DC | PRN
Start: 2016-05-19 — End: 2016-05-27

## 2016-05-19 MED ORDER — FAMOTIDINE 10 MG/ML IV SOLN (WRAP)
20.0000 mg | Freq: Every day | INTRAVENOUS | Status: DC
Start: 2016-05-19 — End: 2016-05-27
  Administered 2016-05-19: 20 mg via INTRAVENOUS
  Filled 2016-05-19: qty 2

## 2016-05-19 MED ORDER — FENTANYL CITRATE (PF) 50 MCG/ML IJ SOLN (WRAP)
50.0000 ug | INTRAMUSCULAR | Status: DC | PRN
Start: 2016-05-19 — End: 2016-05-19

## 2016-05-19 MED ORDER — FENTANYL CITRATE-NACL 1-0.9 MG/100ML-% IV SOLN
12.5000 ug/h | INTRAVENOUS | Status: DC
Start: 2016-05-19 — End: 2016-05-19

## 2016-05-19 MED ORDER — MAGNESIUM SULFATE IN D5W 1-5 GM/100ML-% IV SOLN
1.0000 g | Freq: Once | INTRAVENOUS | Status: AC
Start: 2016-05-19 — End: 2016-05-19
  Administered 2016-05-19: 1 g via INTRAVENOUS
  Filled 2016-05-19: qty 100

## 2016-05-19 MED ORDER — FLUTICASONE-SALMETEROL 115-21 MCG/ACT IN AERO
2.0000 | INHALATION_SPRAY | Freq: Two times a day (BID) | RESPIRATORY_TRACT | Status: DC
Start: 2016-05-19 — End: 2016-05-27
  Administered 2016-05-19 – 2016-05-27 (×12): 2 via RESPIRATORY_TRACT
  Filled 2016-05-19 (×4): qty 8

## 2016-05-19 MED ORDER — FENTANYL CITRATE-NACL 1-0.9 MG/100ML-% IV SOLN
INTRAVENOUS | Status: AC
Start: 2016-05-19 — End: 2016-05-19
  Filled 2016-05-19: qty 100

## 2016-05-19 MED ORDER — SIMVASTATIN 10 MG PO TABS
10.0000 mg | ORAL_TABLET | Freq: Every evening | ORAL | Status: DC
Start: 2016-05-19 — End: 2016-05-19

## 2016-05-19 MED ORDER — FLUTICASONE FUROATE-VILANTEROL 100-25 MCG/INH IN AEPB
1.0000 | INHALATION_SPRAY | Freq: Every morning | RESPIRATORY_TRACT | Status: DC
Start: 2016-05-19 — End: 2016-05-19
  Filled 2016-05-19: qty 14

## 2016-05-19 MED ORDER — METOPROLOL TARTRATE 5 MG/5ML IV SOLN
5.0000 mg | Freq: Four times a day (QID) | INTRAVENOUS | Status: DC | PRN
Start: 2016-05-19 — End: 2016-05-27

## 2016-05-19 MED ORDER — FENTANYL CITRATE (PF) 50 MCG/ML IJ SOLN (WRAP)
50.0000 ug | INTRAMUSCULAR | Status: DC | PRN
Start: 2016-05-19 — End: 2016-05-27

## 2016-05-19 MED ORDER — GABAPENTIN 100 MG PO CAPS
100.0000 mg | ORAL_CAPSULE | Freq: Three times a day (TID) | ORAL | Status: DC
Start: 2016-05-19 — End: 2016-05-27
  Administered 2016-05-19 – 2016-05-27 (×20): 100 mg via ORAL
  Filled 2016-05-19 (×24): qty 1

## 2016-05-19 MED ORDER — MAGNESIUM SULFATE IN D5W 1-5 GM/100ML-% IV SOLN
1.0000 g | Freq: Once | INTRAVENOUS | Status: AC
Start: 2016-05-19 — End: 2016-05-19

## 2016-05-19 MED ORDER — IODIXANOL 320 MG/ML IV SOLN
100.0000 mL | Freq: Once | INTRAVENOUS | Status: AC | PRN
Start: 2016-05-19 — End: 2016-05-19
  Administered 2016-05-19: 100 mL via INTRAVENOUS

## 2016-05-19 MED ORDER — MAGNESIUM SULFATE IN D5W 1-5 GM/100ML-% IV SOLN
INTRAVENOUS | Status: AC
Start: 2016-05-19 — End: 2016-05-19
  Filled 2016-05-19: qty 100

## 2016-05-19 MED ORDER — SODIUM CHLORIDE 0.9 % IV SOLN
INTRAVENOUS | Status: DC
Start: 2016-05-19 — End: 2016-05-19

## 2016-05-19 MED ORDER — FAMOTIDINE 20 MG PO TABS
20.0000 mg | ORAL_TABLET | Freq: Every day | ORAL | Status: DC
Start: 2016-05-19 — End: 2016-05-27
  Administered 2016-05-21 – 2016-05-27 (×7): 20 mg via ORAL
  Filled 2016-05-19 (×8): qty 1

## 2016-05-19 MED ORDER — CHLORHEXIDINE GLUCONATE 0.12 % MT SOLN
15.0000 mL | Freq: Two times a day (BID) | OROMUCOSAL | Status: DC
Start: 2016-05-19 — End: 2016-05-22
  Administered 2016-05-19 – 2016-05-22 (×5): 15 mL via OROMUCOSAL
  Filled 2016-05-19 (×3): qty 15

## 2016-05-19 NOTE — SLP Progress Note (Signed)
[facility]   Speech Therapy Cancellation Note      Patient:  [name] MRN#:  [id number]  Unit:  [facility] TOWER 7 Room/Bed:  F709/F709.01    05/19/2016  Time: 5409      Patient not seen for speech therapy secondary to pt is currently intubated. SLP will cancel current orders. Please re-consult when medically appropriate. Thank you. Larosa Rhines McDonough Audre Cenci MS, CCC-SLP

## 2016-05-19 NOTE — Consults (Signed)
Orthopaedic Consult    Date Time: 05/19/16 7:31 PM  Patient Name: Teresa Starch, DO Attending Physician        Assessment & Plan  Orthopaedic assessment:  82 y.o. female with left displaced reverse obliquity intertrochanteric femur fracture    Seen by orthopaedic resident at: 1830    Reductions/Procedures/Splinting performed (indicate type of Anesthesia used):  None    Plan:    1. Labs and preop diagnostics  2. Admit to Medicine  3. Foley  4. NPO at midnight for OR tomorrow pending clearance, will need INR ~1.5-1.6  5. NWB left lower extremity    [facility] PGY4  Dept of Orthopedics  Page [phone] or call [phone] for questions    Attending Addendum/Attestation:    I have personally seen and examined this patient and have participated in their care. I agree with the clinical information, including the physical exam, patient history, and planning as documented by the Resident or Physician Assistant. In addition, I have edited this note to reflect my findings and plan as well as to incorporate any new data.    Pt cleared for OR, plan for CMN left femur.  NPO for OR.    Chevis Pretty, MD  Orthopaedic Trauma  Pager [phone]  Office [phone]         HPI   [name] is a 82 y.o. female  who presents with left hip pain after a fall yesterday night. She believes that she had a syncopal episode. Pain is sharp, nonradiating, and worse with movement. She denies numbness or tingling. She could not ambulate after the injury. At baseline, the patient the patient ambulates without assisted device Patient denies any other injuries. She does have heart arrythmia which is being followed and has been evaluated by [facility] and Cardiology services. She is on coumadin for her heart arrythmias and her INR on arrival was 4.2.      Past Medical and Surgical History     Past Medical History:   Diagnosis Date   . Abnormal vision     Dry eyes; marginal vision L eye   . Abnormal vision     Wears glasses   .  Anemia     controlled on meds   . Aortic valve insufficiency    . Arrhythmia     Atrial fibrillation   . Arthritis     osteoarthritis, back and pelvis   . AVD (aortic valve disease)    . Benign heart murmur    . Bilateral cataracts     surgery, right eye   . Celiac disease    . Chronic obstructive pulmonary disease     mild-mod   . Difficulty walking     Back pain;    . Disease of lung    . Disorder of musculoskeletal system ?    ?   Marland Kitchen Dyspnea on exertion    . Ear, nose and throat disorder     Hoarseness   . Encounter for cardioversion procedure 10/31/2013   . Gastroesophageal reflux disease    . GERD (gastroesophageal reflux disease)     occasional , asymptomatic   . Headache     infrequently   . Heart murmur    . Hyperlipidemia     controlled with meds   . Hypertension     controlled w/meds   . Hypothyroidism     controlled with meds   . Low back pain    .  Mitral valve insufficiency    . Mitral valve prolapse    . Orthopedic aftercare     scoliosis, stenosis, athritis   . Osteoporosis    . Pneumonia     had pneumonia shot 2010 and 2015   . Primary cardiomyopathy    . Scoliosis    . Shortness of breath    . Thyroid disease    . Vein disorder     bilateral legs        Past Surgical History:   Procedure Laterality Date   . ADENOIDECTOMY      as a child   . APPENDECTOMY      as a child   . CARDIAC CATHETERIZATION  2007   . CARDIAC CATHETERIZATION  2014   . CARDIOVERSION      x3 for A Fib   . CATARACT EXTRACTION W/ INTRAOCULAR LENS IMPLANT Right 2001   . COLONOSCOPY     . EYE SURGERY      Cataract R eye, almost no focusand vision in left eye   . JOINT REPLACEMENT Right 10-15-15    TJR,  No Complications   . KNEE SURGERY Left 2002   . TONSILLECTOMY     . TONSILLECTOMY AND ADENOIDECTOMY         Past Social & Family History   Social History:   Social History     Social History   . Marital status: Married     Spouse name: N/A   . Number of children: N/A   . Years of education: N/A     Social History Main Topics   . Smoking  status: Never Smoker   . Smokeless tobacco: Never Used   . Alcohol use Yes      Comment: Rarely   . Drug use: No   . Sexual activity: No     Other Topics Concern   . Not on file     Social History Narrative   . No narrative on file       Family History:   Family History   Problem Relation Age of Onset   . Pneumonia Mother    . Heart failure Father    . Hypertension Father    . Colon cancer Neg Hx    . Malignant hyperthermia Neg Hx    . Pseudochol deficiency Neg Hx    . Anesthesia problems Neg Hx         Relevant history reviewed as it pertains to current Orthopaedic issues    Review of Systems:   All other systems were reviewed and are negative. She denies nausea, vomiting, shortness of breath or chest pain.    Home Medications     Prior to Admission medications    Medication Sig Start Date End Date Taking? Authorizing Provider   acetaminophen (TYLENOL) 500 MG tablet Take 500 mg by mouth 3 (three) times daily.   Yes [provider]   alendronate (FOSAMAX) 70 MG tablet Take 70 mg by mouth every 7 days.Take 1 tab by mouth every 7 days with a full glass of water on an empty stomach. Do not take anything else by mouth or lie down for 30 mins.   Yes [provider]   Calcium Citrate-Vitamin D (CITRACAL + D PO) Take 2 tablets by mouth daily.   Yes [provider]   CO-ENZYME Q-10 PO Take by mouth daily.   Yes [provider]   dofetilide (TIKOSYN) 250 MCG capsule Take 250 mcg by  mouth 2 (two) times daily.   Yes [provider]   fluticasone furoate (ARNUITY ELLIPTA) 100 MCG/ACT Aerosol Powder, Breath Activtivatede Inhale 1 puff into the lungs daily.   Yes [provider]   Magnesium 250 MG Tab Take 250 mg by mouth daily.   Yes [provider]   furosemide (LASIX) 20 MG tablet Take 20 mg by mouth daily.        [provider]   gabapentin (NEURONTIN) 100 MG capsule Take 100 mg by mouth 3 (three) times daily.    [provider]   iron-vitamin C  (VITRON-C) 65-125 MG Tab Take 1 tablet by mouth every tues and thurs.    [provider]   levothyroxine (SYNTHROID, LEVOTHROID) 112 MCG tablet Take 112 mcg by mouth Once a day at 6:00am.    [provider]   lisinopril (PRINIVIL,ZESTRIL) 5 MG tablet Take 5 mg by mouth daily.    [provider]   Multiple Vitamin (MULTIVITAMIN) tablet Take 1 tablet by mouth daily.    [provider]   Probiotic Product (ALIGN) 4 MG Cap Take by mouth.Take as directed        [provider]   simvastatin (ZOCOR) 10 MG tablet Take 10 mg by mouth nightly.    [provider]   warfarin (COUMADIN) 5 MG tablet Take 5 mg by mouth daily.    [provider]   acetaminophen (TYLENOL) 500 MG chewable tablet Chew 500 mg by mouth 3 (three) times daily.  05/19/16  [provider]   alendronate (FOSAMAX) 70 MG/75ML solution Take 70 mg by mouth every 7 days.Take in the morning with a full glass of water, on an empty stomach, and do not take anything else by mouth or lie down for the next 30 min.  05/19/16  [provider]   calcium-vitamin D (OSCAL-500 + D) 500-200 MG-UNIT per tablet Take 1 tablet by mouth daily.  05/19/16  [provider]   co-enzyme Q-10 30 MG capsule Take 30 mg by mouth daily.  05/19/16  [provider]   dofetilide (TIKOSYN) 250 MCG capsule TAKE ONE CAPSULE TWO TIMES A DAY BY MOUTH 04/04/16 05/19/16  [provider]   fluticasone (VERAMYST) 27.5 MCG/SPRAY nasal spray 2 sprays by Nasal route daily.  05/19/16  [provider]   magnesium oxide (MAG-OX) 400 MG tablet Take 400 mg by mouth daily.  05/19/16  [provider]       Allergies     Allergies   Allergen Reactions   . Erythromycin Hives   . Gluten Meal Other (See Comments)     Has Celiac disease       Radiology Studies (actual Orthopaedically relevant films reviewed and read by Orthopaedics):   Ct Angiogram Head Neck    Result Date: 05/19/2016   1.  The major arterial vessels of the neck and intracranial circulation are widely patent as discussed in detail above. 2. No intracranial hemorrhage is seen. 3. Given the symptoms  , followup MRI of the brain without and with contrast material could be obtained. Theodoro Doing, MD 05/19/2016 9:42 AM     Ct Head Without Contrast    Result Date: 05/18/2016  No acute intracranial abnormality. If continued clinical concern for acute stroke, MRI is more sensitive. These findings were discussed with Dr. Arlana Lindau at 05/18/2016 11:40 PM. Johnsie Kindred, MD 05/18/2016 11:40 PM     Ct Head W Contrast  Result Date: 05/19/2016   1. The major arterial vessels of the neck and intracranial circulation are widely patent as discussed in detail above. 2. No intracranial hemorrhage is seen. 3. Given the symptoms  , followup MRI of the brain without and with contrast material could be obtained. Theodoro Doing, MD 05/19/2016 9:42 AM     Xr Hip Left 2-3 Vw With Pelvis    Result Date: 05/19/2016  Acute intratrochanteric fracture of the left femur. Findings were discussed with and acknowledged by the nurse Katy at 5:46 PM on 05/19/2016. Stephannie Peters, MD 05/19/2016 5:47 PM     Chest Ap Portable    Result Date: 05/18/2016  No acute abnormality. Nonacute findings detailed above. Johnsie Kindred, MD 05/18/2016 11:56 PM     US Venous Low Extrem Duplx Dopp Comp Bilat    Result Date: 05/19/2016   No sonographic evidence for right or left lower extremity deep venous thrombosis. Al Decant, MD 05/19/2016 1:54 PM                          Physical Exam:   Patient is a 82 y.o. year old female who is alert, well appearing, and in no distress, mood is alert  Orientation: Fully Oriented     BP 132/63   Pulse 86   Temp 98.2 F (36.8 C) (Oral)   Resp 15   Ht 1.626 m (5' 4.02")   Wt 66.5 kg (146 lb 9.7 oz)   SpO2 100%   BMI 25.15 kg/m   66.5 kg (146 lb 9.7 oz)  1.626 m (5' 4.02")     Gait: unable to walk     Heart: regular  rate   Lungs: unlabored      Right Upper Extremity:   Inspection:  No swelling, erythema, deformity, atrophy or hypertrophy noted  Palpation:  Tenderness-none  ROM:  within normal limits  Joint Stability: normal  Strength: normal  Skin: normal  Peripheral Vascular: normal  Reflexes: normal  Sensation: normal  Lymph Nodes: None Palpable  Coordination: Normal      Left Lower Extremity:   Inspection:  No swelling, erythema, deformity, atrophy or hypertrophy noted  Palpation:  Tenderness-severe at fracture site  ROM:  severely limited  Joint Stability: unable to assess   Strength: limited by pain   Skin: normal  Peripheral Vascular: normal  Reflexes: unable to assess   Sensation: normal  Lymph Nodes: None Palpable  Coordination: unable to assess      Left Upper Extremity:   Inspection:  No swelling, erythema, deformity, atrophy or hypertrophy noted  Palpation:  Tenderness-none  ROM:  within normal limits  Joint Stability: normal  Strength: normal  Skin: normal  Peripheral Vascular: normal  Reflexes: normal  Sensation: normal  Lymph Nodes: None Palpable  Coordination: Normal      Right Lower Extremity:   Inspection:  No swelling, erythema, deformity, atrophy or hypertrophy noted  Palpation:  Tenderness-none  ROM:  within normal limits  Joint Stability: normal  Strength: normal  Skin: normal  Peripheral Vascular: normal  Reflexes: normal  Sensation: normal  Lymph Nodes: None Palpable  Coordination: Normal     Pelvis:   Skin: normal  Palpation: Tenderness- none  Stability: normal

## 2016-05-19 NOTE — Consults (Signed)
[facility]  EP CONSULTATION REPORT  [facility]      Date Time: 05/19/16 2:15 PM Patient Name: [name]  Requesting Physician: Hulan Fess, DO  Medical Record #:  [id number]  Account#:  [id number]  Admission Date:  05/18/2016      Assessment:    Admitted with LOC shocked out of VT by EMS with recurrent torsades seen on telemetry 05/19/16   Prolonged QT interval, tikosyn d/c'd   Recurrent atrial fibrillation s/p extensive bilateral rotor modulation with temporal dispersion, pulmonary vein isolation, and right isthmus ablation February 26, 2016.   Recurrences postoperatively, one requiring cardioversion March 06, 2016, with only brief recurrences since.   Reloaded on Tikosyn post ablation.   Amiodarone d/c'd with severe DLCO abnormality and moderate obstructive disease   CHA2DS2-VASc 3, continues on Coumadin, admitted with supra-therapeutic INR on admission   TEE March 06, 2016, showing a mildly depressed left ventricular function 45-50%, likely tachy-mediated. Then more depressed on echo 10/17 35%, cath at the time showed no sig CAD    Scoliosis      Recommendations:    Torsades last night on tele, d/c'd tikosyn   Daily EKG's, QTc currently prolonged avoid any QT prolonging agents   Keep electrolytes WNL   Restart coumadin once INR and pt have stabilized     My a/p as above. Extubated and interactive. No ambulance tracings available (checked chart and checked with nurse...) but mostly likely she experienced outside of hospital torsades type cardiac arrest in the setting of LQT and tikosyn therapy (especially since she was noted to have nonsustained runs of polymorphic vt in icu overnight). Discussed with [facility] by phone - would stop tikosyn; monitor closely for now in icu setting; keep k>4 and m>2; rates in sinus ok ie normal (no need to consider raising heart rate, in other words). Resume oac as able once inr comes down. Discussed also with her husband at  bedside (noted that he apparently has issues with alzheimer's, but was the one who checked on her / found her / and called 911 fortunately). Rec daily ecg's for now to monitor qt / rhythms.  Thanks, Particia Lather, MD  [facility]  [phone]; [phone] FFX Spectralink.      Reason for Consultation:   AF     History:   [name] is a 82 [name].o. female admitted on 05/18/2016.  We have been asked by Hulan Fess, DO,  to provide EP consultation, regarding AF. She was found unconscious by her husband on the bedroom floor and EMS was called. She was reported shocked out of a rhythm that was likely VT as she continued to have runs of torsades on tele last night. She is well known to [facility] and underwent an extensive ablation with Dr. Lorel Monaco, and was loaded on tikosyn at that time. QTc interval on discharge was stable <578ms.  Past Medical History:     Past Medical History:   Diagnosis Date   . Abnormal vision     Dry eyes; marginal vision L eye   . Abnormal vision     Wears glasses   . Anemia     controlled on meds   . Aortic valve insufficiency    . Arrhythmia     Atrial fibrillation   . Arthritis     osteoarthritis, back and pelvis   . AVD (aortic valve disease)    . Benign heart murmur    . Bilateral cataracts  surgery, right eye   . Celiac disease    . Chronic obstructive pulmonary disease     mild-mod   . Difficulty walking     Back pain;    . Disease of lung    . Disorder of musculoskeletal system ?    ?   Marland Kitchen Dyspnea on exertion    . Ear, nose and throat disorder     Hoarseness   . Encounter for cardioversion procedure 10/31/2013   . Gastroesophageal reflux disease    . GERD (gastroesophageal reflux disease)     occasional , asymptomatic   . Headache     infrequently   . Heart murmur    . Hyperlipidemia     controlled with meds   . Hypertension     controlled w/meds   . Hypothyroidism     controlled with meds   . Low back pain    . Mitral valve insufficiency    . Mitral valve prolapse    . Orthopedic aftercare      scoliosis, stenosis, athritis   . Osteoporosis    . Pneumonia     had pneumonia shot 2010 and 2015   . Primary cardiomyopathy    . Scoliosis    . Shortness of breath    . Thyroid disease    . Vein disorder     bilateral legs       Past Surgical History:     Past Surgical History:   Procedure Laterality Date   . ADENOIDECTOMY      as a child   . APPENDECTOMY      as a child   . CARDIAC CATHETERIZATION  2007   . CARDIAC CATHETERIZATION  2014   . CARDIOVERSION      x3 for A Fib   . CATARACT EXTRACTION W/ INTRAOCULAR LENS IMPLANT Right 2001   . COLONOSCOPY     . EYE SURGERY      Cataract R eye, almost no focusand vision in left eye   . JOINT REPLACEMENT Right 10-15-15    TJR,  No Complications   . KNEE SURGERY Left 2002   . TONSILLECTOMY     . TONSILLECTOMY AND ADENOIDECTOMY         Family History:     Family History   Problem Relation Age of Onset   . Pneumonia Mother    . Heart failure Father    . Hypertension Father    . Colon cancer Neg Hx    . Malignant hyperthermia Neg Hx    . Pseudochol deficiency Neg Hx    . Anesthesia problems Neg Hx        Social History:     Social History     Social History   . Marital status: Married     Spouse name: N/A   . Number of children: N/A   . Years of education: N/A     Social History Main Topics   . Smoking status: Never Smoker   . Smokeless tobacco: Never Used   . Alcohol use Yes      Comment: Rarely   . Drug use: No   . Sexual activity: No     Other Topics Concern   . Not on file     Social History Narrative   . No narrative on file       Allergies:     Allergies   Allergen Reactions   . Erythromycin Hives   . Gluten Meal Other (  See Comments)     Has Celiac disease       Medications:     Prescriptions Prior to Admission   Medication Sig   . acetaminophen (TYLENOL) 500 MG tablet Take 500 mg by mouth 3 (three) times daily.   Marland Kitchen alendronate (FOSAMAX) 70 MG tablet Take 70 mg by mouth every 7 days.Take 1 tab by mouth every 7 days with a full glass of water on an empty stomach. Do  not take anything else by mouth or lie down for 30 mins.   . Calcium Citrate-Vitamin D (CITRACAL + D PO) Take 2 tablets by mouth daily.   Marland Kitchen CO-ENZYME Q-10 PO Take by mouth daily.   Marland Kitchen dofetilide (TIKOSYN) 250 MCG capsule Take 250 mcg by mouth 2 (two) times daily.   . fluticasone furoate (ARNUITY ELLIPTA) 100 MCG/ACT Aerosol Powder, Breath Activtivatede Inhale 1 puff into the lungs daily.   . Magnesium 250 MG Tab Take 250 mg by mouth daily.   . furosemide (LASIX) 20 MG tablet Take 20 mg by mouth daily.       Marland Kitchen gabapentin (NEURONTIN) 100 MG capsule Take 100 mg by mouth 3 (three) times daily.   . iron-vitamin C (VITRON-C) 65-125 MG Tab Take 1 tablet by mouth every tues and thurs.   Marland Kitchen levothyroxine (SYNTHROID, LEVOTHROID) 112 MCG tablet Take 112 mcg by mouth Once a day at 6:00am.   . lisinopril (PRINIVIL,ZESTRIL) 5 MG tablet Take 5 mg by mouth daily.   . Multiple Vitamin (MULTIVITAMIN) tablet Take 1 tablet by mouth daily.   . Probiotic Product (ALIGN) 4 MG Cap Take by mouth.Take as directed       . simvastatin (ZOCOR) 10 MG tablet Take 10 mg by mouth nightly.   . warfarin (COUMADIN) 5 MG tablet Take 5 mg by mouth daily.       Current Facility-Administered Medications   Medication Dose Route Frequency Provider Last Rate Last Dose   . acetaminophen (TYLENOL) tablet 650 mg  650 mg per OG tube Q4H PRN Hulan Fess, DO   650 mg at 05/19/16 0819   . chlorhexidine (PERIDEX) 0.12 % solution 15 mL  15 mL Mouth/Throat Q12H [place] F, DO   15 mL at 05/19/16 0300   . dofetilide (TIKOSYN) capsule 250 mcg  250 mcg Oral Q12H SCH Cockram, Butch Penny., PA       . famotidine (PEPCID) tablet 20 mg  20 mg Oral Daily [place] F, DO        Or   . famotidine (PEPCID) injection 20 mg  20 mg Intravenous Daily [place] F, DO   20 mg at 05/19/16 0931   . fentaNYL (PF) (SUBLIMAZE) injection 50 mcg  50 mcg Intravenous Q1H PRN Hulan Fess, DO       . FentaNYL-NaCl (PF) 1-0.9 MG/100ML-% 1000 mcg in sodium chloride 0.9%  infusion 100 mL            . FentaNYL-NaCl (PF) 1000 mcg in sodium chloride 0.9% infusion 100 mL  12.5-200 mcg/hr Intravenous Continuous Cockram, Butch Penny., PA       . fluticasone-salmeterol (ADVAIR HFA) 115-21 MCG/ACT inhaler 2 puff  2 puff Inhalation BID Erich Montane D, NP   2 puff at 05/19/16 0820   . hydrALAZINE (APRESOLINE) injection 10 mg  10 mg Intravenous Q3H PRN Hulan Fess, DO       . labetalol (NORMODYNE,TRANDATE) injection 10 mg  10 mg Intravenous Q1H PRN [place]  F, DO       . metoprolol tartrate (LOPRESSOR) injection 5 mg  5 mg Intravenous Q6H PRN Hulan Fess, DO       . multivitamin-minerals-folic acid-coenzyme q10 (AquADEKs) chewable tablet 1 tablet  1 tablet per OG tube Daily Hulan Fess, DO   1 tablet at 05/19/16 0931   . ondansetron (ZOFRAN) injection 4 mg  4 mg Intravenous Q6H PRN Hulan Fess, DO       . propofol (DIPRIVAN) 10 mg/mL infusion (ADULT)  10-80 mcg/kg/min Intravenous Continuous Arlana Lindau, MD   Stopped at 05/19/16 1004   . vecuronium (NORCURON) injection 10 mg  10 mg Intravenous Once Arlana Lindau, MD             Review of Systems:    Comprehensive review of systems including constitutional, eyes, ears, nose, mouth, throat, cardiovascular, GI, GU, musculoskeletal, integumentary, respiratory, neurologic, psychiatric, and endocrine is negative other than what is mentioned already in the history of present illness    Physical Exam:     VITAL SIGNS PHYSICAL EXAM   Vitals:    05/19/16 1300   BP: 134/67   Pulse: 81   Resp: 17   Temp:    SpO2: 100%     Temp (24hrs), Avg:98.5 F (36.9 C), Min:96.7 F (35.9 C), Max:100.9 F (38.3 C)      Intake and Output Summary (Last 24 hours) at Date Time    Intake/Output Summary (Last 24 hours) at 05/19/16 1415  Last data filed at 05/19/16 1300   Gross per 24 hour   Intake           1020.6 ml   Output              425 ml   Net            595.6 ml      Physical Exam  General: awake, alert, breathing  comfortable, no acute distress  Head: normocephalic  Eyes: EOM's intact  Cardiovascular: regular rate and rhythm, S1, S2, no S3, no S4, no murmurs, rubs or gallops  Neck: no carotid bruits or JVD  Lungs: clear to auscultation bilateraly, without wheezing, rhonchi, or rales  Abdomen: soft, non-tender, non-distended; normoactive bowel sounds  Extremities: no edema  Pulse: equal pulses, 4/4 symmetric  Neurological: Alert and oriented X3, mood and affect normal       Labs Reviewed:     Recent Labs  Lab 05/19/16  0414   Troponin I 0.24*           Recent Labs  Lab 05/19/16  0413   Cholesterol 123   Triglycerides 69   HDL 59   LDL Calculated 50       Recent Labs  Lab 05/19/16  0413   Bilirubin, Total 0.7   Bilirubin, Direct 0.4   Protein, Total 4.7*   Albumin 3.0*   ALT 98*   AST (SGOT) 163*       Recent Labs  Lab 05/19/16  0413   Magnesium 1.6       Recent Labs  Lab 05/19/16  0414   PT 40.0*   PT INR 4.2*       Recent Labs  Lab 05/19/16  0414 05/18/16  2307   WBC 8.20 7.82   Hgb 9.5* 12.1   Hematocrit 30.0* 38.2   Platelets 146 179       Recent Labs  Lab 05/19/16  0413 05/18/16  2307   Sodium 141  139   Potassium 3.7 4.0   Chloride 113* 106   CO2 20* 21*   BUN 22.0* 28.0*   Creatinine 0.8 1.2*   EGFR >60.0 43.0   Glucose 134* 193*   Calcium 7.2* 9.2           Imaging   Telemetry: SR with runs of torsades last night terminated spontaneously    Echocardiogram: EF 35% echo 10/17        Signed by: Edythe Lynn, PA-C    [facility]  Arrhythmia Spectralink [phone] (8am-4:30pm)  [facility] Office Number [phone] (if EP line unavailable)  After hours, non urgent consult line [phone]  After Hours, urgent consults [phone]

## 2016-05-19 NOTE — Progress Notes (Addendum)
MEDICARE FOCUS PATIENT  Social worker met with patient and her spouse Jonny Ruiz.  Though she does not recall the events leading to this hospitalization, per her spouse, she is seemingly at baseline today; no cognitive or physical impairment is evidenced.    Patient had no deficits prior.  Anticipate that patient will d/c to home with spouse.    Angus Seller, MSW  MSICU/NSICU Clinical Social Worker  [phone]     05/19/16 1430   Patient Type   Within 30 Days of Previous Admission? No   Healthcare Decisions   Interviewed: Patient;Spouse   Interviewee Contact Information: Jonny Ruiz [phone]   Orientation/Decision Making Abilities of Patient Alert and Oriented x3, able to make decisions   Advance Directive Patient does not have advance directive   Healthcare Agent Appointed No   Prior to admission   Prior level of function Independent with ADLs;Ambulates independently   Type of Residence Private residence   Home Layout One level   Have running water, electricity, heat, etc? Yes   Living Arrangements Spouse/significant other   How do you get to your MD appointments? self/spouse   How do you get your groceries? self   Who fixes your meals? self   Who does your laundry? spouse   Who picks up your prescriptions? self/spouse   Dressing Independent   Grooming Independent   Feeding Independent   Bathing Independent   Toileting Independent   Home Care/Community Services None   Adult Protective Services (APS) involved? No   Discharge Planning   Support Systems Spouse/significant other;Children   Patient expects to be discharged to: home   Anticipated [place] plan discussed with: Same as interviewed   Follow up appointment scheduled? No   Reason no follow up scheduled? Family to schedule   Mode of transportation: Private car (family member)   Consults/Providers   PT Evaluation Needed 1   OT Evalulation Needed 1   SLP Evaluation Needed 1   Correct PCP listed in Epic? Yes   Important Message from [facility] Notice   Patient received 1st  IMM Letter? Yes

## 2016-05-19 NOTE — Consults (Signed)
Admission Medication Reconciliation  Chief Complaint   Patient presents with   . Unresponsive       Allergies:  Erythromycin and Gluten meal    Patient's Pharmacy Information:  [facility] in [place]    Primary Source:  Spouse and Pharmacy    Prior to Admission Medication List:  Medication Sig   . acetaminophen (TYLENOL) 500 MG tablet Take 500 mg by mouth 3 (three) times daily.   Marland Kitchen alendronate (FOSAMAX) 70 MG tablet Take 70 mg by mouth every 7 days.Take 1 tab by mouth every 7 days with a full glass of water on an empty stomach. Do not take anything else by mouth or lie down for 30 mins.   . Calcium Citrate-Vitamin D (CITRACAL + D PO) Take 2 tablets by mouth daily.   Marland Kitchen CO-ENZYME Q-10 PO Take by mouth daily.   Marland Kitchen dofetilide (TIKOSYN) 250 MCG capsule Take 250 mcg by mouth 2 (two) times daily.   . fluticasone furoate (ARNUITY ELLIPTA) 100 MCG/ACT Aerosol Powder, Breath Activtivatede Inhale 1 puff into the lungs daily.   . Magnesium 250 MG Tab Take 250 mg by mouth daily.   . furosemide (LASIX) 20 MG tablet Take 20 mg by mouth daily.       Marland Kitchen gabapentin (NEURONTIN) 100 MG capsule Take 100 mg by mouth 3 (three) times daily.   . iron-vitamin C (VITRON-C) 65-125 MG Tab Take 1 tablet by mouth every tues and thurs.   Marland Kitchen levothyroxine (SYNTHROID, LEVOTHROID) 112 MCG tablet Take 112 mcg by mouth Once a day at 6:00am.   . lisinopril (PRINIVIL,ZESTRIL) 5 MG tablet Take 5 mg by mouth daily.   . Multiple Vitamin (MULTIVITAMIN) tablet Take 1 tablet by mouth daily.   . Probiotic Product (ALIGN) 4 MG Cap Take by mouth.Take as directed       . simvastatin (ZOCOR) 10 MG tablet Take 10 mg by mouth nightly.   . warfarin (COUMADIN) 5 MG tablet Take 5 mg by mouth daily.       Comments/Recommendations:    -The following medications have been changed(reason):   Furosemide updated to 20 mg po daily  Fosamax product changed to Fosamax 70 mg tablets  Magnesium updated to 250 mg po daily    -The following medications have been removed(reason):    Veramyst nasal spray- husband did not have med listed and [facility] did not have a record of filling med    -The following medications have been added:   Arnuity Ellipta 100 mcg- 1 puff by inhalation once daily    Dosage strengths of OTC: Citracal, and Co-enzyme Q10  were not clear  Instructions for use of OTC: Align were not clear      Delene Ruffini, RPH BCPS  Transitions of Care Pharmacist  [phone]

## 2016-05-19 NOTE — Consults (Signed)
Service Date: 05/19/2016     Patient Type: I     CONSULTING PHYSICIAN: Shawnee Knapp MD     REFERRING PHYSICIAN:      TIME OF CONSULTATION:    8:45 a.m.      REASON FOR CONSULTATION:  Unwitnessed fall, altered mental status, rule out stroke.     HISTORY OF PRESENT ILLNESS:  The patient is an 82 year old woman with history of prior stroke, mitral  regurgitation, atrial fibrillation on Coumadin, congestive heart failure  who comes into the hospital with unwitnessed fall, left-sided weakness,  concerned about stroke.  The patient is currently intubated but alert.   History is mostly from medical records.  The patient was watching TV with  her husband last night when she went to another room; 30 to 40 minutes  later husband went to check on her and found her lying on the floor.  She  was unconscious and would not answer.  EMS was called.  The patient was  noted to have wide complex tachycardia en route.  This was suspected as  ventricular tachycardia.  She was cardioverted x2.  The patient in the ED  had NIH stroke scale of 21.  CT brain scan negative for any bleed, no TPA  was administered due to her INR of 3.7.  The patient currently is more  awake, responsive, denies pain.  She is intubated in the ICU.     PAST MEDICAL HISTORY:  Pulmonary hypertension, mitral regurgitation, atrial fibrillation, ischemic  stroke, PFO, long-term anticoagulation, history of anemia, aortic stenosis,  celiac disease, COPD, osteoarthritis, hypertension, hyperlipidemia, mitral  valve prolapse, hypothyroidism, cardiomyopathy, scoliosis.     PAST SURGICAL HISTORY:  Appendectomy, cardiac catheterization, cataract surgery, tonsillectomy,  knee surgery, colonoscopy.     FAMILY HISTORY:  Noncontributory.     SOCIAL HISTORY:  Nonsmoker, no alcohol or drug abuse.  Lives with her husband.     CURRENT MEDICATIONS:  Include Lasix, Neurontin, , Zocor, warfarin.     ALLERGIES:  ERYTHROMYCIN, GLUTEN due to celiac disease.     REVIEW OF SYSTEMS:  Not  practical as patient is intubated.     PHYSICAL EXAMINATION:  VITAL SIGNS:  Temperature 100.9, pulse 84, atrial fibrillation,  respirations 16, blood pressure 132/73, oxygen saturation 100%.  GENERAL:  A pleasant, cooperative, thin-built, elderly woman propped up in  bed, intubated on a ventilator.  HEAD:  No external signs of trauma.  NECK:  Supple.  ENT:  Unremarkable.  SKIN:  Warm, dry.  EXTREMITIES:  Show arthritic changes.  No clubbing, cyanosis or edema.   Peripheral pulses intact.  NEUROLOGIC:  Shows patient to be awake, alert.  She nods appropriately,  follows commands.  Extraocular movements full without gaze deviation.  Face  is grossly symmetric within the limitations of intubation.  She is able to  squeeze with both hands.  There may be minimal drift on the left arm.  She  can follow commands.  She can hold up 2 fingers, wiggles her toes.   Negative Babinski.  HEART:  Sounds irregular, systolic murmur, no carotid bruits, no signs of  meningeal irritation.     LABORATORY AND DIAGNOSTIC DATA:  Shows hemoglobin 9.5, hematocrit 30, white cell count 8.2; platelet count  146,000.  Sodium 141, potassium 3.7, chloride 113, bicarbonate 20, BUN 22,  creatinine 0.8, random glucose 134.  INR last night, 3.7; this morning is  4.2.  CT brain scan negative for any acute intracranial process.  CT  angiogram:  No large vessel disease.     IMPRESSION:  An 82 year old with history of hypertension, hyperlipidemia, congestive  heart failure, mitral regurgitation, mitral valve prolapse, atrial  fibrillation on Coumadin, prior history of ischemic stroke, celiac disease,  other problems listed above, comes in with unwitnessed fall, syncope versus  seizure.  The patient noted to have left hemiparesis upon presentation with  an NIH stroke scale of 21.  No TPA was administered due to INR of 3.7.  The  patient is neurologically much improved, which raises question of a  differential diagnosis of transient attack versus stroke  versus unwitnessed  seizure with Todd's palsy.  Possibility of primary arrhythmia causing  syncopal event is also a consideration.     RECOMMENDATIONS:  Brain MRI to look for infarct.  EEG to look for seizure focus.  Continue  general medical and supportive care, blackout precautions, fall  precautions, optimize INR, serial neurologic exams.     Diagnosis, recommendation and plan of care discussed with the patient.   Care coordinated with the stroke nurse, attending physician and the nursing  staff.  Plan is to follow patient clinically, review diagnostic data.   Additional recommendation will depend upon clinical course and results of  above testing.      Critically ill, being monitored and treated in ICU.  Total time spent 50 minutes     D:  05/19/2016 13:25 PM by Dr. Shawnee Knapp, MD ([id number])  T:  05/19/2016 13:58 PM by [id number]      Everlean Cherry: [id number]) (Doc ID: [id number])

## 2016-05-19 NOTE — Progress Notes (Signed)
EEG completed as ordered.  Report to follow.   For EEG/Evoked Potentials Results:     Dial [phone] or [id number]   Enter Provider ID #   Press * 3   Enter Provider ID # again   Enter the default PIN # - [id number]   Change the default PIN # (1st time)  Listen by: Medical Record Number Press 1

## 2016-05-19 NOTE — Stroke Progress Note (Signed)
Background:   82 y.o. female presents to the emergency department with complaint(s) of Unresponsive       Initial NIHSS completed at 2300.    Last known well is 2120.  Husband states that patient went to the bathroom around 2120 after about 30 minutes he went to check on her and found her unresponsive laying on floor.  Upon EMS arrival patient remained unresponsive, abnormal ECG, was cardioverted x 2 enroute to ED.  On exam patient is only responding to pain, does not open eyes, moaning, moving head back and forth, no purposeful movements.  Pupils equal and reactive.  Noted to have decreased movement to left upper and lower extremities.  Moving right side more aggressively than left.  Of note, patient is on coumadin for afib.    Past Medical History:   Diagnosis Date   . Abnormal vision     Dry eyes; marginal vision L eye   . Abnormal vision     Wears glasses   . Anemia     controlled on meds   . Aortic valve insufficiency    . Arrhythmia     Atrial fibrillation   . Arthritis     osteoarthritis, back and pelvis   . AVD (aortic valve disease)    . Benign heart murmur    . Bilateral cataracts     surgery, right eye   . Celiac disease    . Chronic obstructive pulmonary disease     mild-mod   . Difficulty walking     Back pain;    . Disease of lung    . Disorder of musculoskeletal system ?    ?   Marland Kitchen Dyspnea on exertion    . Ear, nose and throat disorder     Hoarseness   . Encounter for cardioversion procedure 10/31/2013   . Gastroesophageal reflux disease    . GERD (gastroesophageal reflux disease)     occasional , asymptomatic   . Headache     infrequently   . Heart murmur    . Hyperlipidemia     controlled with meds   . Hypertension     controlled w/meds   . Hypothyroidism     controlled with meds   . Low back pain    . Mitral valve insufficiency    . Mitral valve prolapse    . Orthopedic aftercare     scoliosis, stenosis, athritis   . Osteoporosis    . Pneumonia     had pneumonia shot 2010 and 2015   . Primary  cardiomyopathy    . Scoliosis    . Shortness of breath    . Thyroid disease    . Vein disorder     bilateral legs        Plan:  NIHSS = 21, mRS: 1 = No significant disability despite symptoms; able to carry out all usual duties and activities..  Patient's past medical history and tPA exclusion criteria reviewed.  Patient is not a candidate for tPA secondary to Current use of all dosage forms of anticoagulant in the previous 48 hours and a prothrombin time of  greater than 15 seconds or INR greater than 1.7 or elevated aPTT at presentation.    Patient required intubation prior to initial CT.      Case Discussed with:    Case discussed with Dr. Merry Proud (ED attending).    23:41 - Called and discussed case with Dr. Lamonte Sakai (neurology).  Will proceed with CTA to eval  for LVO, no to IV-tPA secondary to INR >1.7.

## 2016-05-19 NOTE — Procedures (Signed)
EEG report    Date of study: May 19, 2016.    Indication: Unresponsiveness.    Summary of findings:    The background rhythm consisted of 20-50 V activity region from 4 Hz delta through 8-9 Hz alpha activity, somewhat disorganized at times.  There appeared to be a bit more prominent slowing seen over the left side in the frontotemporal region, though independent delta slowing was seen on the right as well.  Typical myogenic and electrode artifacts were noted, at times, producing sharp contours but no definite discharges or seizure activity was seen.  EKG showed a regular rhythm.    Impression:    Findings are consistent with mild and occasionally more mild-to-moderate generalized dysfunction, nonspecific for etiology.  Occasional independent slowing was seen over the frontotemporal regions as well.  No definite discharges or seizures were noted, although technical artifacts were seen, as described above.  Correlation recommended.    Marland Kitchen

## 2016-05-19 NOTE — Progress Notes (Signed)
Time out pre procedure pause performed with Dr.Ramesh, Subhashini.  Physician gave go ahead. All necessary equipment was available. Patient extubated to 4l/m NC.     05/19/16 1100   Extubation   Extubation reason Param/CPAP Protocol   Extubated to Nasal cannula   Adverse Reactions None

## 2016-05-19 NOTE — Research Enrollment (Signed)
Title: Cardiovascular Precision Medicine Study    Date: 05/19/2016    [name] was enrolled in the Cardiovascular Precision Medicine Study protocol (IR[facility] [phone]). [name] has been fully informed about the study including procedures, risks, and benefits. Patient was given a study flyer and showed interest in participating. Pt was then given the consent form, read it and/or had it read to them, and was given the opportunity to ask questions.    After all questions were answered, and the patient agreed to comply with all study related procedures including the length of participation, [name] signed the consent document. Patient was given a copy of the signed consent form with study contact information, the original consent was faxed to [facility] medical records, and then secured in a locked study file cabinet. Collection of study specimens did not commence until after patient had all questions answered and signed the Informed Consent Form     For study related questions, please contact:    Investigators: Marlowe Sax, MD ex. [phone]  Clinical Research Coordinators: Stann Ore ex. [phone]

## 2016-05-19 NOTE — Plan of Care (Signed)
Problem: Safety  Goal: Patient will be free from injury during hospitalization  Outcome: Progressing   05/19/16 0745   Goal/Interventions addressed this shift   Patient will be free from injury during hospitalization  Assess patient's risk for falls and implement fall prevention plan of care per policy;Provide and maintain safe environment;Use appropriate transfer methods;Ensure appropriate safety devices are available at the bedside;Include patient/ family/ care giver in decisions related to safety;Hourly rounding;Assess for patients risk for elopement and implement Elopement Risk Plan per policy;Provide alternative method of communication if needed (communication boards, writing)   All standard safety precautions taken. Pt free from injury this shift. Continuing to monitor.    Problem: Day of Admission - Stroke  Goal: Core/Quality measure requirements - Admission  Outcome: Progressing   05/19/16 0745   Goal/Interventions addressed this shift   Core/Quality measure requirements - Admission Document NIH Stroke Scale on admission;Document nursing swallow/dysphagia screen on admission. If patient fails, keep patient NPO (follow your hospital protocol on swallowing screening).;VTE Prevention: Ensure anticoagulant(s) administered and/or anti-embolism stockings/devices documented as ordered;Ensure antithrombotic administered or contraindication documented by LIP;If diagnosis or history of Atrial Fib/Atrial Flutter, ensure oral anticoagulation is initiated or contraindication documented by LIP;Ensure lipid panel ordered;Begin stroke education on admission (must include Modifiable Risk Factors, Warning Signs and Symptoms of Stroke, Activation of Emergency Medical System and Follow-up Appointments) Ensure handout has been given and documented.;Ensure PT/OT and/or SLP ordered   NIHSS = 18 on admission to NSICU. Pt on prop. With best exam, pt opens eyes to voice, nods/gestures, FC, tracks/EOMs intact, PERRL at 3-66mm and  brisk, +cough/gag/corneals, 3/5 strength in BUE, 3/5 in RLE, 2/5 in LLE. Pt afebrile; tmax 99. SBP on low end of goal. Continuing to monitor.    Problem: Non-Violent Restraints Interdisciplinary Plan  Goal: Will be injury free during the use of non-violent restraints  Outcome: Not Progressing   05/19/16 0745   Goal/Interventions addressed this shift   Will be injury free during the use of non-violent restraints Attempt all alternatives before use of restraints;Initiate least restrictive type of restraint that is effective;Provide and maintain safe environment;Include patient/family/caregiver in decisions related to safety;Document observed patient actions according to protocol;Notify family of initiation of restraints;Ensure safety devices are properly applied and maintained;Nurse to accompany patient off unit when on restraints;Remove restraints before the indicated maximum length of time when meets criteria for discontinuation   Pt continuously reaching for ETT despite teaching. Soft limb restraints placed on BUE. [facility] aware Clent Ridges). Continuing to monitor.    Comments: Pt had multiple episodes of VTach and VFib, though short lived. Pt converted back to rate controlled afib after each event and pt remained non-symptomatic. 12 lead EKG completed per orders, labs (Mg, Ph, K) sent and are pending. Episodes have slowed as shift progresses but pt has frequent multifocal PVCs. Continuing to monitor.

## 2016-05-19 NOTE — Plan of Care (Addendum)
Neuro exam improved since beginning of shift, once extubated and sedation held. See doc flow sheets for details. Afib on tele, frequent PVCs, seen by Cardio today. Extubated at 1100, now on 2L NC. No BM today, but passing flatus. Straight cath x 1 at 0800 today, voided 250 at 1600. Skin grossly intact.         Problem: Safety  Goal: Patient will be free from injury during hospitalization  Outcome: Progressing   05/19/16 0745   Goal/Interventions addressed this shift   Patient will be free from injury during hospitalization  Assess patient's risk for falls and implement fall prevention plan of care per policy;Provide and maintain safe environment;Use appropriate transfer methods;Ensure appropriate safety devices are available at the bedside;Include patient/ family/ care giver in decisions related to safety;Hourly rounding;Assess for patients risk for elopement and implement Elopement Risk Plan per policy;Provide alternative method of communication if needed (communication boards, writing)   Free from injury this shift.     Problem: Pain  Goal: Pain at adequate level as identified by patient  Outcome: Progressing   05/19/16 1858   Goal/Interventions addressed this shift   Pain at adequate level as identified by patient Identify patient comfort function goal;Assess for risk of opioid induced respiratory depression, including snoring/sleep apnea. Alert healthcare team of risk factors identified.;Assess pain on admission, during daily assessment and/or before any "as needed" intervention(s);Evaluate if patient comfort function goal is met;Reassess pain within 30-60 minutes of any procedure/intervention, per Pain Assessment, Intervention, Reassessment (AIR) Cycle;Evaluate patient's satisfaction with pain management progress;Offer non-pharmacological pain management interventions;Consult/collaborate with Pain Service;Consult/collaborate with Physical Therapy, Occupational Therapy, and/or Speech Therapy;Include  patient/patient care companion in decisions related to pain management as needed   No complaints of pain this shift. Refusing pain medication.     Problem: Every Day - Stroke  Goal: Core/Quality measure requirements - Daily  Outcome: Progressing   05/19/16 1858   Goal/Interventions addressed this shift   Core/Quality measure requirements - Daily  VTE Prevention: Ensure anticoagulant(s) administered and/or anti-embolism stockings/devices documented by end of day 2;Ensure antithrombotic administered or contraindication documented by LIP by end of day 2;Once lipid panel has resulted, check LDL. Contact provider for statin order if LDL > 70 (or ensure contraindication documented by LIP).;Continue stroke education (must include Modifiable Risk Factors, Warning Signs and Symptoms of Stroke, Activation of Emergency Medical System and Follow-up Appointments). Ensure handout has been given and documented.     Goal: Neurological status is stable or improving  Outcome: Progressing   05/19/16 1858   Goal/Interventions addressed this shift   Neurological status is stable or improving Monitor/assess/document neurological assessment (Stroke: every 4 hours);Observe for seizure activity and initiate seizure precautions if indicated;Perform CAM Assessment   Neuro exam improved once off sedation and extubated. GCS q1 hour, full neuro q 4. See doc flow sheets for details.   Goal: Stable vital signs and fluid balance   05/19/16 1858   Goal/Interventions addressed this shift   Stable vital signs and fluid balance  Position patient for maximum circulation/cardiac output;Monitor and assess vitals every 4 hours or as ordered and hemodynamic parameters;Monitor intake and output. Notify LIP if urine output is < 30 mL/hour.;Encourage oral fluid intake;Apply telemetry monitor as ordered     Goal: Patient will maintain adequate oxygenation   05/19/16 1858   Goal/Interventions addressed this shift   Patient will maintain adequate oxygenation   Suction secretions as needed;Maintain SpO2 of greater than 92%;Sleep Apnea: Assess if previously tested or on CPAP;Sleep Apnea:  Encourage patient to speak to PCP regarding sleep apnea/sleep study ([place] only)   Extubated at 1100. On 2L NC. Lung sounds clear.   Goal: Patient's risk of aspiration will be minimized  Outcome: Not Progressing   05/19/16 1858   Goal/Interventions addressed this shift   Patient's risk of aspiration will be minimized Complete new dysphagia screen for any change in status: Keep patient NPO if patient fails;Place swallow precaution signage above bed;Monitor/assess for signs of aspiration (tachypnea, cough, wheezing, clearing throat, hoarseness after eating, decrease in SaO2;Order modified texture diet as recommend by Speech Pathologist;Thicken liquids as recommended by Speech Pathologist;Keep head of bed up a minimum of 30 degrees when hemodynamically stable;Assess and monitor ability to swallow;Place patient up in chair to eat, if possible;HOB up 90 degrees to eat if unable to be OOB;Instruct patient to take small single sips of liquid;Do not use a straw;Instruct patient to take small bites;Supervise patient during oral intake;Consult/Collaborate with Speech Pathologist for dysphagia   Seen by SLP today.   Goal: Nutritional intake is adequate  Outcome: Not Progressing   05/19/16 1858   Goal/Interventions addressed this shift   Nutritional intake is adequate Monitor daily weights;Assist patient with meals/food selection;Allow adequate time for meals;Encourage/perform oral hygiene as appropriate;Encourage/administer dietary supplements as ordered (i.e. tube feed, TPN, oral, OGT/NGT, supplements);Consult/collaborate with Clinical Nutritionist;Include patient/patient care companion in decisions related to nutrition;Assess anorexia, appetite, and amount of meal/food tolerated;Consult/collaborate with Speech Therapy (swallow evaluations)   Failed swallow eval today. Ice chips ok. Meds with apple  sauce. NPO at midnight for OR tomorrow.   Goal: Mobility/Activity is maintained at optimal level for patient  Outcome: Not Progressing   05/19/16 1858   Goal/Interventions addressed this shift   Mobility/activity is maintained at optimal level for patient Increase mobility as tolerated/progressive mobility;Encourage independent activity per ability;Maintain proper body alignment;Perform active/passive ROM;Plan activities to conserve energy, plan rest periods;Reposition patient every 2 hours and as needed unless able to reposition self;Assess for changes in respiratory status, level of consciousness and/or development of fatigue;Consult/collaborate with Physical Therapy and/or Occupational Therapy   Acute left hip fracture post fall at home. Pt able to assist with turns. Plan for ortho surgery tomorrow. Turn q2 hours.   Goal: Skin integrity is maintained or improved  Outcome: Progressing   05/19/16 1858   Goal/Interventions addressed this shift   Skin integrity is maintained or improved Assess Braden Scale every shift;Turn or reposition patient every 2 hours or as needed unless able to reposition self;Increase activity as tolerated/progressive mobility;Relieve pressure to bony prominences;Avoid shearing;Keep skin clean and dry;Encourage use of lotion/moisturizer on skin;Monitor patient's hygiene practices;Collaborate with Wound, Ostomy, and Continence Nurse;Utilize specialty bed;Keep head of bed 30 degrees or less (unless contraindicated)       Problem: Impaired Mobility  Goal: Mobility/Activity is maintained at optimal level for patient  Outcome: Not Progressing   05/19/16 1858   Goal/Interventions addressed this shift   Mobility/activity is maintained at optimal level for patient Increase mobility as tolerated/progressive mobility;Encourage independent activity per ability;Maintain proper body alignment;Perform active/passive ROM;Plan activities to conserve energy, plan rest periods;Reposition patient every 2 hours  and as needed unless able to reposition self;Assess for changes in respiratory status, level of consciousness and/or development of fatigue;Consult/collaborate with Physical Therapy and/or Occupational Therapy   Acute left hip fracture post fall at home. Pt able to assist with turns. Plan for ortho surgery tomorrow. Turn q2 hours.     Problem: Peripheral Neurovascular Impairment  Goal: Extremity color, movement, sensation are maintained  or improved  Outcome: Progressing   05/19/16 1858   Goal/Interventions addressed this shift   Extremity color, movement, sensation are maintained or improved  Increase mobility as tolerated/progressive mobility;Assess and monitor application of corrective devices (cast, brace, splint), check skin integrity;Assess extremity for proper alignment;Teach/review/reinforce ankle pump exercises;VTE Prevention: Administer anticoagulant(s) and/or apply anti-embolism stockings/devices as ordered       Problem: Compromised skin integrity  Goal: Skin integrity is maintained or improved  Outcome: Progressing   05/19/16 1858   Goal/Interventions addressed this shift   Skin integrity is maintained or improved Assess Braden Scale every shift;Turn or reposition patient every 2 hours or as needed unless able to reposition self;Increase activity as tolerated/progressive mobility;Relieve pressure to bony prominences;Avoid shearing;Keep skin clean and dry;Encourage use of lotion/moisturizer on skin;Monitor patient's hygiene practices;Collaborate with Wound, Ostomy, and Continence Nurse;Utilize specialty bed;Keep head of bed 30 degrees or less (unless contraindicated)       Problem: Nutrition  Goal: Nutritional intake is adequate  Outcome: Not Progressing   05/19/16 1858   Goal/Interventions addressed this shift   Nutritional intake is adequate Monitor daily weights;Assist patient with meals/food selection;Allow adequate time for meals;Encourage/perform oral hygiene as appropriate;Encourage/administer  dietary supplements as ordered (i.e. tube feed, TPN, oral, OGT/NGT, supplements);Consult/collaborate with Clinical Nutritionist;Include patient/patient care companion in decisions related to nutrition;Assess anorexia, appetite, and amount of meal/food tolerated;Consult/collaborate with Speech Therapy (swallow evaluations)   Failed swallow eval today. Ice chips ok. Meds with apple sauce. NPO at midnight for OR tomorrow.     Problem: Neurological Deficit  Goal: Neurological status is stable or improving  Outcome: Progressing   05/19/16 1858   Goal/Interventions addressed this shift   Neurological status is stable or improving Monitor/assess/document neurological assessment (Stroke: every 4 hours);Observe for seizure activity and initiate seizure precautions if indicated;Perform CAM Assessment   Neuro exam improved once off sedation and extubated. GCS q1 hour, full neuro q 4. See doc flow sheets for details.

## 2016-05-19 NOTE — UM Notes (Signed)
In-pt Status: 05/19/16 at 0051    05/19/16:  82 y.o. female provided by husband. They were watching a tv program together and Mrs. Dresner went to another room. about 30-40 minutes later, Mr Hoskin went to check on her lying and found on the bathroom floor. "she was unconscious and would not answer", "I know she was breathing, I could see her breathing" he did not notice any spontaneous movement. EMS was called. earlier in the day she had one episode severe vomiting and decreased appetite throughout the day. She was in normal state of health prior to this.  INR 3.7  Cardioverted times 2 en route for V-tach. May have been a-fib with RVR down BB. Or left ant hemiblock..  Intubated for airway protection and to safely facilitate additional work-up.  Called by ED, patient evaluated by stroke team. No TPA due to elevated INR, CTA complete and being reviewed to evaluate for large vessel occlusion at time of call.  Known PFO see cardiac cath below under radiology.  113, 173/93, 23, 97.5  SpO2 99%  Glucose 193, BUN 28, cr 1.2, CO2 21, ast 153, alt 96, PT 36.1, INR 3.7, troponin 0.22, pH arterial 7.299, pO2 arterial 517  CT of head: The major arterial vessels of the neck and intracranial circulation are widely patent  2. No intracranial hemorrhage is seen.  3. Given the symptoms , followup MRI of the brain without and with contrast material could be obtained.  Amidate IV, Propofol gtt, Anectine IV in the ER    Episode Care Day #1:  Admit to NSICU  Fentanyl gtt  Propofol gtt  MRI of brain pending  Bed rest  PRVC FiO2 100%  V/s q 1h  NIHSS = 18 on admission to NSICU. Pt on prop. With best exam, pt opens eyes to voice, nods/gestures, FC, tracks/EOMs intact, PERRL at 3-26mm and brisk, +cough/gag/corneals, 3/5 strength in BUE, 3/5 in RLE, 2/5 in LLE  Plan per MD: Cardiovascular: lipid panel  Hold zocor elevated transaminase . Continue dofetelide in am. Daily INR, trend enzymes. Therapeutic INR. Source of embolism left side or  right and through PFO. echo in am. LE dopplers.  Outpatient BP at cardiology office 140's/70   wt. 137 lbs on 04/06/16  Pulmonary: vent bundle. abg reviewed. Bre-lo bid  Gastrointestinal: NPO except meds. OGT in place. Famotidine 20 mg daily. Start Tube feeds over next 24 hours  Infectious Disease: No issues identified.  Neurologic: MRI. Repeat Head CT any neuro change. SBP goal 120-160 mm Hg since fully anti-coagulated. Daily PT/INR.  Normal sodium goal.   Renal: NSS at 75 ml/hr. Check lytes replace as needed  Hem/Onc: daily pt/inr cbc while on warfarin.  Endocrine: check TSH continue synthroid. Glucose less than 180.        Bobette Mo Karel Jarvis, RN BSN  Utilization Review  [employer]  Tel: [phone]

## 2016-05-19 NOTE — SLP Eval Note (Signed)
[facility]   Speech and Language Therapy Bedside Swallow Evaluation     Patient: [name]    MRN#: [id number]     Consult received for [name] for SLP Bedside Swallow Evaluation and Treatment.    Plan/Recommendations:   Diet Solid Recommendations: NPO except ice chips   Liquid Recommendations:   (NPO)  Administration of Medications: crushed, with puree  Precautions/Compensations: Awake/alert;Upright 90 degrees for all oral intake;45 degrees upright after meals (for ice chips)  Aspiration Precautions posted at bedside: n/a     Follow up treatments: Other (comment) (re assessment)  Recommendation Discussed With: : Patient;Caregiver;Nurse     SLP Frequency Recommended: 5x per week    Discharge recommendations: Home with supervision/assistance    Assessment:   Patient presents with mild oral and suspect moderate pharyngeal dysphagia at this time.  Subtle clinical s/s aspiration demonstrated across all textures presented at this time.  Mastication is prolonged, but effective.  Swallow initiation is delayed, with noted grimace and mild discomfort until swallow completed.   Noted left side buccal weakness, uncertain if baseline.   Voice is clear, but mildly dysphonic.  Recommend continue with NPO except for ice chips.  Meds crushed with puree. SLP will re assess tomorrow. Suspect will be able to initiate PO diet quickly, once fully recuperated from intubation.     History of Present Illness:   [name] is a 82 y.o. female admitted on 05/18/2016. "Husband states that patient went to the bathroom around 2120 after about 30 minutes he went to check on her and found her unresponsive laying on floor.  Upon EMS arrival patient remained unresponsive, abnormal ECG, was cardioverted x 2 enroute to ED.  On exam patient is only responding to pain, does not open eyes, moaning, moving head back and forth, no purposeful movements.  Pupils equal and reactive.  Noted to have decreased movement to left  upper and lower extremities.  Moving right side more aggressively than left.  Of note, patient is on coumadin for afib."    Chest xray: IMPRESSION: No acute abnormality. Nonacute findings detailed above. Johnsie Kindred, MD  05/18/2016 11:56 PM    Medical Diagnosis: Elevated INR [R79.1]  Atrial fibrillation with RVR [I48.91]  Altered mental status, unspecified altered mental status type [R41.82]  Hemiparesis of left nondominant side, unspecified hemiparesis etiology [G81.94]  Unresponsiveness [R41.89]    Therapy Diagnosis: oropharyngeal dysphagia, dysphonia    Past Medical/Surgical History:  Past Medical History:   Diagnosis Date   . Abnormal vision     Dry eyes; marginal vision L eye   . Abnormal vision     Wears glasses   . Anemia     controlled on meds   . Aortic valve insufficiency    . Arrhythmia     Atrial fibrillation   . Arthritis     osteoarthritis, back and pelvis   . AVD (aortic valve disease)    . Benign heart murmur    . Bilateral cataracts     surgery, right eye   . Celiac disease    . Chronic obstructive pulmonary disease     mild-mod   . Difficulty walking     Back pain;    . Disease of lung    . Disorder of musculoskeletal system ?    ?   Marland Kitchen Dyspnea on exertion    . Ear, nose and throat disorder     Hoarseness   . Encounter for cardioversion procedure  10/31/2013   . Gastroesophageal reflux disease    . GERD (gastroesophageal reflux disease)     occasional , asymptomatic   . Headache     infrequently   . Heart murmur    . Hyperlipidemia     controlled with meds   . Hypertension     controlled w/meds   . Hypothyroidism     controlled with meds   . Low back pain    . Mitral valve insufficiency    . Mitral valve prolapse    . Orthopedic aftercare     scoliosis, stenosis, athritis   . Osteoporosis    . Pneumonia     had pneumonia shot 2010 and 2015   . Primary cardiomyopathy    . Scoliosis    . Shortness of breath    . Thyroid disease    . Vein disorder     bilateral legs     Past Surgical History:    Procedure Laterality Date   . ADENOIDECTOMY      as a child   . APPENDECTOMY      as a child   . CARDIAC CATHETERIZATION  2007   . CARDIAC CATHETERIZATION  2014   . CARDIOVERSION      x3 for A Fib   . CATARACT EXTRACTION W/ INTRAOCULAR LENS IMPLANT Right 2001   . COLONOSCOPY     . EYE SURGERY      Cataract R eye, almost no focusand vision in left eye   . JOINT REPLACEMENT Right 10-15-15    TJR,  No Complications   . KNEE SURGERY Left 2002   . TONSILLECTOMY     . TONSILLECTOMY AND ADENOIDECTOMY       History/Current Status:  Respiratory Status: O2 via nasal cannula  Date extubated: 05/19/16 (1100 )  Behavior/Mental Status: Awake/alert;Able to follow directions;Cooperative;Pleasant mood  Nutrition: NPO    Subjective:   Patient is agreeable to participation in the therapy session. Family and/or guardian are agreeable to patient's participation in the therapy session. Nursing clears patient for therapy. Patient's medical condition is appropriate for Speech therapy intervention at this time.     PAIN:  Grimace with swallow. Subsided once swallow completed. RN attending to for hip pain. Pending hip xray.     Objective:   Observation of Patient/Vital Signs:  Patient is in bed with telemetry and SCD's in place.    Patient left with call bell within reach, all needs met, fall mat in place, bed alarm activated and all questions answered. RN notified of session outcome and patient response.     Oral Motor Skills:  Engineer, maintenance (IT) Skills: exceptions to [facility]  Oral Motor Impairments: strength;dysphonia (mildly reduced)    Deglutition Skills:  Deglutition Skills  Position: upright 90 degrees  Food(s) Tested: ice chips;thin liquid;nectar thick liquid;puree;solid  Oral Stage: chewing reduced, slow but effective;impaired swallow initiation;residuals  Oral Stage Residuals: partially cleared;spontaneous swallow;liquid assist  Pharyngeal Stage: delayed response;reduced laryngeal elevation;throat clearing;delayed weak  cough      Goals:  Patient will tolerate trials of ice chips x 10 with no clinical s/s aspiration. NEW  Patient will tolerate 10/10 trials of liquids with no clinical s/s aspiration.   Patient will tolerate 10/10 trials of puree with no clinical s/s aspiration.     Lyndee Leo, MA, CCC-SLP    Pager [phone]      Time of Treatment:  SLP Received On: 05/19/16  Start Time: 1550  Stop Time: 1608  Time Calculation (min): 18 min

## 2016-05-19 NOTE — H&P (Addendum)
[facility]  ADMISSION- HISTORY & PHYSICAL EXAM    Patient Name: [name], [name]  Date/Time: 05/19/16 4:10 AM  Admitting Physician: Hulan Fess, DO  Primary Care Physician: Danford Bad, MD  Location/Room: [phone]       Chief Complaint:     Chief Complaint   Patient presents with   . Unresponsive       HPI:   [name] is a 82 y.o. female provided by husband. They were watching a tv program together and [name] went to another room. about 30-40 minutes later, [name] went to check on her lying and found on the bathroom floor. "she was unconscious and would not answer", "I know she was breathing, I could see her breathing" he did not notice any spontaneous movement. EMS was called. earlier in the day she had one episode severe vomiting and decreased appetite throughout the day. She was in normal state of health prior to this.    INR 3.7    Cardioverted times 2 en route for V-tach. May have been a-fib with RVR down BB. Or left ant hemiblock..    Intubated for airway protection and to safely facilitate additional work-up    Called by ED, patient evaluated by stroke team. No TPA due to elevated INR, CTA complete and being reviewed to evaluate for large vessel occlusion at time of call.    Known PFO see cardiac cath below under radiology.    Problem List:     Active Hospital Problems    Diagnosis   . Ischemic embolic stroke   . Decreased diffusion capacity   . PFO (patent foramen ovale)   . Acquired hypothyroidism   . Chronic systolic heart failure   . PAH (pulmonary artery hypertension)   . Non-rheumatic mitral regurgitation   . Respiratory failure   . Ischemic stroke   . CHF (congestive heart failure)   . Atrial fibrillation       Assessment:       Plan:     Cardiovascular: lipid panel  Hold zocor elevated transaminase . Continue dofetelide in am. Daily INR, trend enzymes. Therapeutic INR. Source of embolism left side or right and through PFO. echo in am. LE dopplers.    Outpatient BP  at cardiology office 140's/70   wt. 137 lbs on 04/06/16    Pulmonary: vent bundle. abg reviewed. Bre-lo bid    Gastrointestinal: NPO except meds. OGT in place. Famotidine 20 mg daily. Start Tube feeds over next 24 hours    Infectious Disease: No issues identified.    Neurologic: MRI. Repeat Head CT any neuro change. SBP goal 120-160 mm Hg since fully anti-coagulated. Daily PT/INR.  Normal sodium goal.     Renal: NSS at 75 ml/hr. Check lytes replace as needed    Hem/Onc: daily pt/inr cbc while on warfarin.    Endocrine: check TSH continue synthroid. Glucose less than 180.      Code Status: Full Code    Past Medical History:     Past Medical History:   Diagnosis Date   . Abnormal vision     Dry eyes; marginal vision L eye   . Abnormal vision     Wears glasses   . Anemia     controlled on meds   . Aortic valve insufficiency    . Arrhythmia     Atrial fibrillation   . Arthritis     osteoarthritis, back and pelvis   . AVD (aortic valve disease)    .  Benign heart murmur    . Bilateral cataracts     surgery, right eye   . Celiac disease    . Chronic obstructive pulmonary disease     mild-mod   . Difficulty walking     Back pain;    . Disease of lung    . Disorder of musculoskeletal system ?    ?   Marland Kitchen Dyspnea on exertion    . Ear, nose and throat disorder     Hoarseness   . Encounter for cardioversion procedure 10/31/2013   . Gastroesophageal reflux disease    . GERD (gastroesophageal reflux disease)     occasional , asymptomatic   . Headache     infrequently   . Heart murmur    . Hyperlipidemia     controlled with meds   . Hypertension     controlled w/meds   . Hypothyroidism     controlled with meds   . Low back pain    . Mitral valve insufficiency    . Mitral valve prolapse    . Orthopedic aftercare     scoliosis, stenosis, athritis   . Osteoporosis    . Pneumonia     had pneumonia shot 2010 and 2015   . Primary cardiomyopathy    . Scoliosis    . Shortness of breath    . Thyroid disease    . Vein disorder     bilateral  legs         Past Surgical History:     Past Surgical History:   Procedure Laterality Date   . ADENOIDECTOMY      as a child   . APPENDECTOMY      as a child   . CARDIAC CATHETERIZATION  2007   . CARDIAC CATHETERIZATION  2014   . CARDIOVERSION      x3 for A Fib   . CATARACT EXTRACTION W/ INTRAOCULAR LENS IMPLANT Right 2001   . COLONOSCOPY     . EYE SURGERY      Cataract R eye, almost no focusand vision in left eye   . JOINT REPLACEMENT Right 10-15-15    TJR,  No Complications   . KNEE SURGERY Left 2002   . TONSILLECTOMY     . TONSILLECTOMY AND ADENOIDECTOMY         Home Medications     Prior to Admission medications    Medication Sig Start Date End Date Taking? Authorizing Provider   acetaminophen (TYLENOL) 500 MG chewable tablet Chew 500 mg by mouth 3 (three) times daily.    [provider]   alendronate (FOSAMAX) 70 MG/75ML solution Take 70 mg by mouth every 7 days.Take in the morning with a full glass of water, on an empty stomach, and do not take anything else by mouth or lie down for the next 30 min.    [provider]   calcium-vitamin D (OSCAL-500 + D) 500-200 MG-UNIT per tablet Take 1 tablet by mouth daily.    [provider]   co-enzyme Q-10 30 MG capsule Take 30 mg by mouth daily.    [provider]   dofetilide (TIKOSYN) 250 MCG capsule TAKE ONE CAPSULE TWO TIMES A DAY BY MOUTH 04/04/16   [provider]   fluticasone (VERAMYST) 27.5 MCG/SPRAY nasal spray 2 sprays by Nasal route daily.    [provider]   furosemide (LASIX) 20 MG tablet Take 20 mg by mouth 2 (two) times daily.    [provider]   gabapentin (NEURONTIN) 100 MG capsule Take 100 mg by mouth 3 (three) times daily.    [provider]   iron-vitamin C (VITRON-C) 65-125 MG Tab Take 1 tablet by mouth every tues and thurs.    [provider]   levothyroxine (SYNTHROID, LEVOTHROID) 112 MCG tablet Take 112 mcg by mouth Once a day at 6:00am.    [provider]    lisinopril (PRINIVIL,ZESTRIL) 5 MG tablet Take 5 mg by mouth daily.    [provider]   magnesium oxide (MAG-OX) 400 MG tablet Take 400 mg by mouth daily.    [provider]   Multiple Vitamin (MULTIVITAMIN) tablet Take 1 tablet by mouth daily.    [provider]   Probiotic Product (ALIGN) 4 MG Cap Take by mouth.    [provider]   simvastatin (ZOCOR) 10 MG tablet Take 10 mg by mouth nightly.    [provider]   warfarin (COUMADIN) 5 MG tablet Take 5 mg by mouth daily.    [provider]        Family History:     Family History   Problem Relation Age of Onset   . Pneumonia Mother    . Heart failure Father    . Hypertension Father    . Colon cancer Neg Hx    . Malignant hyperthermia Neg Hx    . Pseudochol deficiency Neg Hx    . Anesthesia problems Neg Hx        Social History:     Social History     Social History   . Marital status: Married     Spouse name: N/A   . Number of children: N/A   . Years of education: N/A     Social History Main Topics   . Smoking status: Never Smoker   . Smokeless tobacco: Never Used   . Alcohol use Yes      Comment: Rarely   . Drug use: No   . Sexual activity: No     Other Topics Concern   . Not on file     Social History Narrative   . No narrative on file       Allergies:     Allergies   Allergen Reactions   . Erythromycin Hives   . Gluten Meal Other (See Comments)     Has Celiac disease       Review of Systems:   unable tp check    Physical Exam:   Vitals: BP 120/58   Pulse 94   Temp 98.5 F (36.9 C) (Oral)   Resp (!) 11   Ht 1.626 m (5' 4.02")   Wt 66.5 kg (146 lb 9.7 oz)   SpO2 100%   BMI 25.15 kg/m   Vent settings: FiO2:  [40 %-100 %] 40 %  S RR:  [14] 14  S VT:  [440 mL] 440 mL  PEEP/EPAP:  [5 cm H20] 5 cm H20  Admission Weight: Weight: 63.5 kg (139 lb 15.9 oz)    Patient Lines/Drains/Airways Status    Active PICC Line / CVC Line / PIV Line / Drain / Airway / Intraosseous Line / Epidural Line / ART Line / Line  / Wound / Pressure Ulcer / NG/OG Tube     Name:   Placement date:   Placement time:   Site:   Days:    Peripheral IV 05/18/16 Right Antecubital  05/18/16    2245  Antecubital    less than 1    Peripheral IV 05/18/16 Left Antecubital  05/18/16        Antecubital    1    NG/OG Tube Orogastric 18 Fr. Left mouth  05/18/16    2314    Left mouth    less than 1    ETT  7.5 mm  05/18/16    2308      less than 1                General Appearance:  chronically ill appearing    Mental status: sedated    Neuro: spontaneous on right, right side UE and LE anti-gravity. LUE anti-gravity with pain. Left lower flicker, maybe minimal triple flexion, tous up on right, equivocal on left. Exam on 15 mcg/propofol.  pupils equal react, cough gag present.    HEENT: sclera clea,r small contusion hematoma on left a few cm above zygomatic arch.. Mucous membranes moist    Neck: no evidence of elevated JVP    Lungs:CTA no wheeze or rhonchi, equal breath sounds and chest movement    Cardiac: irreg/irreg no murmur    Abdomen: soft, nontender, nondistended, no masses or organomegaly    Extremities: edema present, no cyanosis, no obvious injury or deformity. Scar anterior surface left knee. Recent total knee 07/17        Other:       Labs:     CBC:     Recent Labs  Lab 05/18/16  2307   WBC 7.82   RBC 3.79*   Hgb 12.1   Hematocrit 38.2   MCV 100.8*   Platelets 179     Chemistry:     Recent Labs  Lab 05/18/16  2307 05/18/16  2306   Sodium 139  --    Potassium 4.0  --    Chloride 106  --    CO2 21*  --    BUN 28.0*  --    Creatinine 1.2*  --    Glucose 193*  --    Calcium 9.2  --    Magnesium  --  2.1     LFTs:     Recent Labs  Lab 05/18/16  2307   ALT 96*   AST (SGOT) 153*   Bilirubin, Total 0.8   Albumin 3.8   Alkaline Phosphatase 86     Cardiac Enzymes:     Coags:     Recent Labs  Lab 05/18/16  2307   PT INR 3.7*   PT 36.1*       Radiology / Imaging:     Imaging personally reviewed by me, including: CXR: Tubes and lines in good  position.        CARDIAC CA  Baseline Measurements:              RA                   5 mmHg              RV                   50/10 mmHg               PA                   50/17 mmHg and mean 28 mmHg              PCWP  18 mmHg mean              TPG                 10 mmHg              LV:                   100/12 mm Hg              SVC Sat          69 %              IVC Sat            73 %              RA Sat             82 %              PA Sat             78 %              Art Sat             93 %              Fick CO           7.9 L/min              Fick CI             4.8 L/min/m2              TD CO             5.2 L/min              TD CI               3.2 L/min/m2              PVR                 1.3 Wood units with Fick and 2.1 Wood units with TD              Qp                   12.6 L/min              Qs                    7.9 L/min              Qp:Qs              1.6      Coronary Angiography:              Dominance:     right              LM:                  Long, mild disease              LAD:                Relatively small, mild disease              Ramus:            Large, mild disease              LCx:  Mild disease              RCA:                Large with PDA reaching apex; mild disease                Left Heart Catheterization:              Pullback:         no gradient              LV Gram:         LVEF 35% with mild mitral regurgitation                                          Impression:  No significant obstructive coronary artery disease  Mild pulmonary hypertension with mean PAP of 28 mmHg  R to L shunt with Qp:Qs of 1.6  LVEDP of 12 mm Hg indicates that filling L sided filling pressures are not elevated post diuresis    Recommendations:  Continue to observe  Would only consider closure of defect caused by trans-septal puncture if she has evidence of right heart failure or has new dilation of right sided chambers  Physiologically, the right to left heart  shunt is not playing a role in left sided heart failure and her LVEDP presently is 12 mm Hg     Attestation & Billing:       Patient's condition and plan discussed with: family, bedside nurse, emergency physician and respiratory therapy    This patient has a high probability of sudden clinically significant deterioration which requires the highest level of physician preparedness to intervene urgently. I managed/supervised life or organ supporting interventions that required frequent physician assessments. I devoted my full attention in the ICU to the direct care of this patient for this period of time.    So far today I have spent  92 minutes providing critical  care for this patient exclusive of  teaching, billable procedures, and not overlapping with any other providers.      Signed by: Hulan Fess, DO   [id number], Molli Posey, MD

## 2016-05-20 ENCOUNTER — Inpatient Hospital Stay: Payer: Medicare Other | Admitting: Anesthesiology

## 2016-05-20 ENCOUNTER — Encounter
Admission: EM | Disposition: A | Discharge status: Skilled Nursing Facility | Payer: Self-pay | Source: Home / Self Care | Attending: Internal Medicine

## 2016-05-20 ENCOUNTER — Inpatient Hospital Stay: Payer: Medicare Other

## 2016-05-20 DIAGNOSIS — S72142A Displaced intertrochanteric fracture of left femur, initial encounter for closed fracture: Secondary | ICD-10-CM

## 2016-05-20 DIAGNOSIS — I5022 Chronic systolic (congestive) heart failure: Secondary | ICD-10-CM

## 2016-05-20 DIAGNOSIS — S72102A Unspecified trochanteric fracture of left femur, initial encounter for closed fracture: Secondary | ICD-10-CM

## 2016-05-20 DIAGNOSIS — E039 Hypothyroidism, unspecified: Secondary | ICD-10-CM

## 2016-05-20 DIAGNOSIS — W19XXXA Unspecified fall, initial encounter: Secondary | ICD-10-CM

## 2016-05-20 LAB — PREPARE FRESH FROZEN PLASMA
Expiration Date: 201712021551
Expiration Date: 201712021551
Expiration Date: 201712021551
Expiration Date: 201712021551
Status: TRANSFUSED
Status: TRANSFUSED
Status: TRANSFUSED
Status: TRANSFUSED
UTYPE: A POS
UTYPE: A POS
UTYPE: A POS
UTYPE: A POS

## 2016-05-20 LAB — PT/INR
PT INR: 3.9 — ABNORMAL HIGH (ref 0.9–1.1)
PT INR: 1.4 — ABNORMAL HIGH (ref 0.9–1.1)
PT: 37.7 s — ABNORMAL HIGH (ref 12.6–15.0)
PT: 17.5 s — ABNORMAL HIGH (ref 12.6–15.0)

## 2016-05-20 LAB — CBC
Absolute NRBC: 0 10*3/uL
Hematocrit: 28.4 % — ABNORMAL LOW (ref 37.0–47.0)
Hgb: 9.3 g/dL — ABNORMAL LOW (ref 12.0–16.0)
MCH: 32.6 pg — ABNORMAL HIGH (ref 28.0–32.0)
MCHC: 32.7 g/dL (ref 32.0–36.0)
MCV: 99.6 fL (ref 80.0–100.0)
MPV: 11.2 fL (ref 9.4–12.3)
Nucleated RBC: 0 /100 WBC (ref 0.0–1.0)
Platelets: 113 10*3/uL — ABNORMAL LOW (ref 140–400)
RBC: 2.85 10*6/uL — ABNORMAL LOW (ref 4.20–5.40)
RDW: 14 % (ref 12–15)
WBC: 5.55 10*3/uL (ref 3.50–10.80)

## 2016-05-20 LAB — ECG 12-LEAD
Atrial Rate: 102 {beats}/min
P Axis: 89 degrees
P-R Interval: 182 ms
Q-T Interval: 360 ms
QRS Duration: 106 ms
QTC Calculation (Bezet): 469 ms
R Axis: -62 degrees
T Axis: 103 degrees
Ventricular Rate: 102 {beats}/min

## 2016-05-20 LAB — TROPONIN I: Troponin I: 0.15 ng/mL — ABNORMAL HIGH (ref 0.00–0.09)

## 2016-05-20 LAB — BASIC METABOLIC PANEL
BUN: 15 mg/dL (ref 7.0–19.0)
CO2: 20 mEq/L — ABNORMAL LOW (ref 22–29)
Calcium: 7.6 mg/dL — ABNORMAL LOW (ref 7.9–10.2)
Chloride: 112 mEq/L — ABNORMAL HIGH (ref 100–111)
Creatinine: 0.7 mg/dL (ref 0.6–1.0)
Glucose: 114 mg/dL — ABNORMAL HIGH (ref 70–100)
Potassium: 4.4 mEq/L (ref 3.5–5.1)
Sodium: 140 mEq/L (ref 136–145)

## 2016-05-20 LAB — GFR: EGFR: >60

## 2016-05-20 LAB — MAGNESIUM: Magnesium: 2.1 mg/dL (ref 1.6–2.6)

## 2016-05-20 SURGERY — OPEN TREATMENT, INTERTROCHANTERIC OR SUBTROCHANTERIC FEMUR FRACTURE WITH INTRAMEDULLARY IMPLANT
Anesthesia: Regional | Site: Hip | Laterality: Left | Wound class: Clean

## 2016-05-20 MED ORDER — CALCIUM CITRATE-VITAMIN D 315-250 MG-UNIT PO TABS
2.0000 | ORAL_TABLET | Freq: Every day | ORAL | Status: DC
Start: 2016-05-20 — End: 2016-05-27
  Administered 2016-05-21: 0.5 via ORAL
  Administered 2016-05-22 – 2016-05-27 (×5): 2 via ORAL
  Filled 2016-05-20 (×7): qty 2

## 2016-05-20 MED ORDER — NALOXONE HCL 0.4 MG/ML IJ SOLN (WRAP)
0.4000 mg | INTRAMUSCULAR | Status: DC | PRN
Start: 2016-05-20 — End: 2016-05-27

## 2016-05-20 MED ORDER — FENTANYL CITRATE (PF) 50 MCG/ML IJ SOLN (WRAP)
25.0000 ug | INTRAMUSCULAR | Status: DC | PRN
Start: 2016-05-20 — End: 2016-05-20

## 2016-05-20 MED ORDER — CEFAZOLIN SODIUM-DEXTROSE 1-4 GM-% IV SOLR
1.0000 g | Freq: Three times a day (TID) | INTRAVENOUS | Status: AC
Start: 2016-05-20 — End: 2016-05-21
  Administered 2016-05-21 (×2): 1 g via INTRAVENOUS
  Filled 2016-05-20 (×2): qty 50

## 2016-05-20 MED ORDER — FUROSEMIDE 10 MG/ML IJ SOLN
20.0000 mg | Freq: Once | INTRAMUSCULAR | Status: AC
Start: 2016-05-20 — End: 2016-05-20
  Administered 2016-05-20: 20 mg via INTRAVENOUS
  Filled 2016-05-20: qty 4

## 2016-05-20 MED ORDER — VITAMIN K1 10 MG/ML IJ SOLN
5.0000 mg | Freq: Once | INTRAVENOUS | Status: AC
Start: 2016-05-20 — End: 2016-05-20
  Administered 2016-05-20: 5 mg via INTRAVENOUS
  Filled 2016-05-20: qty 0.5

## 2016-05-20 MED ORDER — HYDROMORPHONE HCL 1 MG/ML IJ SOLN
0.5000 mg | INTRAMUSCULAR | Status: DC | PRN
Start: 2016-05-20 — End: 2016-05-20

## 2016-05-20 MED ORDER — SODIUM CHLORIDE 0.9 % IR SOLN
Status: DC | PRN
Start: 2016-05-20 — End: 2016-05-20
  Administered 2016-05-20: 1000 mL

## 2016-05-20 MED ORDER — CEFAZOLIN SODIUM 1 G IJ SOLR
INTRAMUSCULAR | Status: DC | PRN
Start: 2016-05-20 — End: 2016-05-20
  Administered 2016-05-20: 1 g via INTRAVENOUS

## 2016-05-20 MED ORDER — PROPOFOL INFUSION 10 MG/ML
INTRAVENOUS | Status: DC | PRN
Start: 2016-05-20 — End: 2016-05-20
  Administered 2016-05-20: 20 ug/kg/min via INTRAVENOUS

## 2016-05-20 MED ORDER — SIMVASTATIN 10 MG PO TABS
10.0000 mg | ORAL_TABLET | Freq: Every evening | ORAL | Status: DC
Start: 2016-05-20 — End: 2016-05-27
  Administered 2016-05-21 – 2016-05-26 (×6): 10 mg via ORAL
  Filled 2016-05-20 (×8): qty 1

## 2016-05-20 MED ORDER — PROPOFOL 10 MG/ML IV EMUL (WRAP)
INTRAVENOUS | Status: AC
Start: 2016-05-20 — End: ?
  Filled 2016-05-20: qty 50

## 2016-05-20 MED ORDER — PROPOFOL 10 MG/ML IV EMUL (WRAP)
INTRAVENOUS | Status: AC
Start: 2016-05-20 — End: ?
  Filled 2016-05-20: qty 20

## 2016-05-20 MED ORDER — HYDROMORPHONE HCL 2 MG PO TABS
2.0000 mg | ORAL_TABLET | Freq: Once | ORAL | Status: DC | PRN
Start: 2016-05-20 — End: 2016-05-20

## 2016-05-20 MED ORDER — SENNOSIDES-DOCUSATE SODIUM 8.6-50 MG PO TABS
2.0000 | ORAL_TABLET | Freq: Two times a day (BID) | ORAL | Status: DC
Start: 2016-05-20 — End: 2016-05-26
  Administered 2016-05-21 – 2016-05-22 (×3): 2 via ORAL
  Filled 2016-05-20 (×2): qty 2
  Filled 2016-05-20 (×2): qty 1
  Filled 2016-05-20 (×2): qty 2

## 2016-05-20 MED ORDER — LACTATED RINGERS IV SOLN
125.0000 mL/h | INTRAVENOUS | Status: DC
Start: 2016-05-20 — End: 2016-05-21

## 2016-05-20 MED ORDER — LEVOTHYROXINE SODIUM 112 MCG PO TABS
112.0000 ug | ORAL_TABLET | Freq: Every day | ORAL | Status: DC
Start: 2016-05-20 — End: 2016-05-27
  Administered 2016-05-21 – 2016-05-27 (×7): 112 ug via ORAL
  Filled 2016-05-20 (×11): qty 1

## 2016-05-20 MED ORDER — PROPOFOL INFUSION 10 MG/ML
INTRAVENOUS | Status: DC | PRN
Start: 2016-05-20 — End: 2016-05-20
  Administered 2016-05-20: 20 mg via INTRAVENOUS

## 2016-05-20 MED ORDER — LACTATED RINGERS IV SOLN
INTRAVENOUS | Status: DC | PRN
Start: 2016-05-20 — End: 2016-05-20

## 2016-05-20 MED ORDER — PROMETHAZINE HCL 25 MG/ML IJ SOLN
6.2500 mg | Freq: Once | INTRAMUSCULAR | Status: DC | PRN
Start: 2016-05-20 — End: 2016-05-20

## 2016-05-20 MED ORDER — CEFAZOLIN SODIUM 1 G IJ SOLR
INTRAMUSCULAR | Status: AC
Start: 2016-05-20 — End: ?
  Filled 2016-05-20: qty 1000

## 2016-05-20 SURGICAL SUPPLY — 73 items
APPLCATOR CHLORAPREP 26ML (Prep) ×4 IMPLANT
BIT DRILL L180 MM OD4.2 MM SMALL AO (Drillbits) ×2
BIT DRILL L180 MM OD4.2 MM SMALL AO COUPLING (Drillbits) ×2 IMPLANT
BIT DRILL L300 MM OD4.2 MM SMALL AO (Drillbits) ×1
BIT DRILL L300 MM OD4.2 MM SMALL GAMMA3 AO (Drillbits) ×1 IMPLANT
BIT DRL 4.2MM 300MM STRL QC DISP (Drillbits) ×1
BIT DRL SM 4.2MM 180MM STRL AO CPLNG (Drillbits) ×2
CATHETER FOLEY KT 16FR (Catheter Micellaneous)
CATHETER FOLEY KT 16FR (Catheter Miscellaneous) IMPLANT
DRAPE INCISE IOBAN-2 90X60CM (Drape) IMPLANT
DRAPE SRG PLS STRDRP IOBN 125X83IN STRL (Drape) ×1
DRAPE SRG PLS U STRDRP 51X47IN LF STRL (Drape)
DRAPE SRG TBRN LG CNVRT 98X72IN LF STRL (Procedure Accessories) ×2
DRAPE SURGICAL 2 INCISE FILM POUCH (Drape) ×1
DRAPE SURGICAL 2 INCISE FILM POUCH ISOLATION ADHESIVE STRIP L125 IN X (Drape) ×1 IMPLANT
DRAPE SURGICAL ADHESIVE L51 IN X W47 IN (Drape)
DRAPE SURGICAL ADHESIVE L51 IN X W47 IN STERI-DRAPE CLEAR (Drape) IMPLANT
DRAPE SURGICAL FANFOLD L98 IN X W72 IN (Procedure Accessories) ×2
DRAPE SURGICAL FANFOLD L98 IN X W72 IN CONVERTORS TIBURON LARGE (Procedure Accessories) ×2 IMPLANT
DRESSING PETRO 3% BI 3BRM GZE XR 9X5IN (Dressing) ×2
DRESSING PETROLATUM L9 IN X W5 IN NONADHESIVE OCCLUSIVE BACTERIOSTATIC (Dressing) ×1 IMPLANT
DRESSING TRANSPARENT L4 3/4 IN X W4 IN (Dressing) ×4
DRESSING TRANSPARENT L4 3/4 IN X W4 IN POLYURETHANE ADHESIVE (Dressing) ×4 IMPLANT
DRESSING TRNS PU STD TGDRM 4.75X4IN LF (Dressing) ×4
GLOVE SRG NTR RBR 8 INDCTR BGL 299X103MM (Glove) ×1
GLOVE SURG LTX SZ 8 STRL (Glove) ×4 IMPLANT
GLOVE SURGICAL 8 INDICATOR BIOGEL POWDER (Glove) ×1
GLOVE SURGICAL 8 INDICATOR BIOGEL POWDER FREE SMOOTH BEAD CUFF (Glove) ×1 IMPLANT
GUIDEWIRE ORTH TI T2 3MM 1000MM STRL (Guide Wire) ×1
GUIDEWIRE ORTHOPEDIC T2 OD3 MM L1000 MM (Guide Wire) ×1
GUIDEWIRE ORTHOPEDIC T2 OD3 MM L1000 MM BALL TIP TITANIUM FEMORAL NAIL (Guide Wire) ×1 IMPLANT
NAIL IM TI 120D GAMMA3 11MM 180MM STRL (Nail) ×1 IMPLANT
NAIL IM TI 120D R1.5 GAMMA3 13MM 400MM (Nail) ×1 IMPLANT
NAIL OD11 MM L180 MM HIP TROCHANTER (Nail) ×1 IMPLANT
NAIL OD11 MM L180 MM TROCHANTER TITANIUM GAMMA3 INTRAMEDULLARY KIT 120 (Nail) ×1 IMPLANT
NAIL OD13 MM L400 MM FEMUR LEFT TITANIUM GAMMA3 INTRAMEDULLARY LOCK (Nail) ×1 IMPLANT
NAIL OD13 MM L400 MM FEMUR LT TITANIUM (Nail) ×1 IMPLANT
PAD ABD PVC CRTY 9X5IN LF STRL 3 LYR (Dressing) IMPLANT
PAD ELECTROSRG GRND REM W CRD (Procedure Accessories) ×2 IMPLANT
PADDING CAST L4 YD X W6 IN UNDERCAST (Bandage) ×1
PADDING CAST L4 YD X W6 IN UNDERCAST HAND TEARABLE SPECIALIST 100 (Bandage) ×1 IMPLANT
PADDING CST CTTN SPCLST 100 4YDX6IN LF (Bandage) ×1
REAMER MODIFY TRINKLE CLEARANCE HIGH (Reamer) ×1
REAMER MODIFY TRINKLE CLEARANCE HIGH RELIABILITY LOW FRICTION SURFACE (Reamer) ×1 IMPLANT
REAMER SRG BIXCUT 8MM 448MM STRL MOD (Reamer) ×1
SCREW BN TI FT 5MM 52.5MM STRL LCK T2 IM (Screw) ×1 IMPLANT
SCREW BN TI FT 5MM 55MM STRL LCK T2 IM (Screw) ×2 IMPLANT
SCREW L100 MM OD10.5 MM TITANIUM LAG TROCHANTER NAIL 180 BONE GAMMA3 (Screw) ×1 IMPLANT
SCREW L52.5 MM OD5 MM FULL THREAD (Screw) ×1 IMPLANT
SCREW L52.5 MM OD5 MM FULL THREAD TITANIUM LOCK T2 INTRAMEDULLARY NAIL (Screw) ×1 IMPLANT
SCREW L55 MM OD5 MM FULL THREAD TITANIUM (Screw) ×2 IMPLANT
SCREW L55 MM OD5 MM FULL THREAD TITANIUM LOCK T2 INTRAMEDULLARY NAIL (Screw) ×2 IMPLANT
SCREW LAG 10.5X100MM (Screw) ×2 IMPLANT
SOLUTION IRR 0.9% NACL 1000ML LF STRL (Irrigation Solutions) ×1
SOLUTION IRRIGATION 0.9% SODIUM CHLORIDE (Irrigation Solutions) ×1
SOLUTION IRRIGATION 0.9% SODIUM CHLORIDE 1000 ML PLASTIC POUR BOTTLE (Irrigation Solutions) ×1 IMPLANT
SOLUTION ISPRPL ALCHL 70% 4OZ (Scrub Supplies) ×2 IMPLANT
SPONGE GAUZE L4 IN X W4 IN 12 PLY (Sponge) ×2 IMPLANT
SPONGE GZE PLS CTTN CRTY 4X4IN LF STRL (Sponge) ×2
SPONGE LAP 18X18IN PREWASH WHT (Sponge)
SPONGE LAPAROTOMY L18 IN X W18 IN (Sponge)
SPONGE LAPAROTOMY L18 IN X W18 IN PREWASH WHITE (Sponge) IMPLANT
SUTURE ABS 2-0 CT1 VCL 36IN BRD COAT (Suture) ×1
SUTURE COATED VICRYL 2-0 CT-1 L36 IN (Suture) ×1
SUTURE COATED VICRYL 2-0 CT-1 L36 IN BRAID COATED VIOLET ABSORBABLE (Suture) ×1 IMPLANT
SUTURE ETHILON 3-0 30IN PSLX (Suture) IMPLANT
SUTURE VICRYL 0-0 CT1 (Suture) ×2 IMPLANT
TAPE SURGICAL DURAPORE 3IN (Tape) ×2 IMPLANT
TOWEL STERILE REUSABLE 8PK (Procedure Accessories) ×6 IMPLANT
TRAY FRACTURE TABLE IFH (Tray) ×2 IMPLANT
WIRE FIXATION OD3.2 MM L450 MM KIRSCHNER (Guide Wire) ×1 IMPLANT
WIRE FX KRSH 3.2MM 450MM STRL (Guide Wire) ×1
WRAP COBAN SELF ADHRNT 6INX5YD (Procedure Accessories) ×2 IMPLANT

## 2016-05-20 NOTE — PACU (Signed)
Clarified with anesthesia Dr.Chandy, pt is ok to go the floor.  Pt has medicine team and is followed by cardiology per charge RN report.

## 2016-05-20 NOTE — PACU (Signed)
Husband at bedside.  

## 2016-05-20 NOTE — Anesthesia Procedure Notes (Signed)
Peripheral    Patient location during procedure: Pre-Op  Reason for block: Post-op pain managment  Injection technique: Single-shot  Block Region:  Laterality: Left  Block at surgeon's request Yes  Start time: 05/20/2016 5:00 PM  End time: 05/20/2016 5:05 PM    Staffing  Anesthesiologist: Fidela Salisbury A    Pre-procedure Checklist   Completed: patient identified, surgical consent, pre-op evaluation, timeout performed, risks and benefits discussed, anesthesia consent given and correct site      Peripheral Block  Patient monitoring: Pulse oximetry and NIBP  Patient position: Supine  Local infiltration: Lidocaine 1%    Needle  Needle type: Stim needle   Needle length: 10 cm    Procedures: ultrasound guided  Ultrasound Guided: LA spread visualized      Assessment   Incremental injection: yes  Injection made incrementally with aspirations every 5 mL.  Injection Resistance: no  Paresthesia Pain: No  Blood Aspirated: Yes (transient flushed and no more heme aspirated)  no suspected intravascular injection  Patient tolerated procedure well: Yes  Block Outcome: No complications  Additional Notes  30 cc 0.5% rop with 30 cc NS and 5 mg decadron

## 2016-05-20 NOTE — Progress Notes (Signed)
Left fascia iliaca nerve block  completed. Vital signs monitored and charted. Sterile protocol maintained. Patient tolerated procedure well. Discussed expected outcomes, (pain control,decreased opioid requirements and side effects, limb safety). Patient verbalized understanding.      Anesthesia doctor Iaconetti at bedside

## 2016-05-20 NOTE — Plan of Care (Signed)
Problem: Safety  Goal: Patient will be free from injury during hospitalization  Outcome: Progressing   05/20/16 0517   Goal/Interventions addressed this shift   Patient will be free from injury during hospitalization  Assess patient's risk for falls and implement fall prevention plan of care per policy;Provide and maintain safe environment;Use appropriate transfer methods;Ensure appropriate safety devices are available at the bedside;Include patient/ family/ care giver in decisions related to safety;Hourly rounding;Assess for patients risk for elopement and implement Elopement Risk Plan per policy;Provide alternative method of communication if needed (communication boards, writing)       Problem: Pain  Goal: Pain at adequate level as identified by patient  Outcome: Not Progressing   05/20/16 0517   Goal/Interventions addressed this shift   Pain at adequate level as identified by patient Identify patient comfort function goal;Assess for risk of opioid induced respiratory depression, including snoring/sleep apnea. Alert healthcare team of risk factors identified.;Assess pain on admission, during daily assessment and/or before any "as needed" intervention(s);Reassess pain within 30-60 minutes of any procedure/intervention, per Pain Assessment, Intervention, Reassessment (AIR) Cycle;Evaluate if patient comfort function goal is met;Evaluate patient's satisfaction with pain management progress;Offer non-pharmacological pain management interventions;Include patient/patient care companion in decisions related to pain management as needed       Problem: Every Day - Stroke  Goal: Neurological status is stable or improving  Outcome: Progressing   05/20/16 0517   Goal/Interventions addressed this shift   Neurological status is stable or improving Monitor/assess/document neurological assessment (Stroke: every 4 hours);Perform CAM Assessment     Pt's neuro status is well intact. She is A&O x4 with some confusion on why she is  here and can be forgetful. Complains of back and hip pain, treated with tylenol and gabapentin (usually takes combo at home for back pain), and pain was relieved. Waiting for ortho surg to receive consent from pt regarding hip surgery that may take place on 11/29. For now pt consents to FFP transfusion per Otho Darner NP and myself as witness for increased INR if she does consent to surgery in AM. Requests husband is present for surgery consent. Pt is resting comfortably in bed and reports manageable pain level. Will continue to monitor.

## 2016-05-20 NOTE — Anesthesia Postprocedure Evaluation (Signed)
Anesthesia Post Evaluation    Patient: [name]    Procedure(s):  CLOSED REDUCTION, INSERTION, HIP IM NAIL (GAMMA)    Anesthesia type: Spinal    Last Vitals:   Vitals:    05/20/16 1645   BP: 135/63   Pulse: 95   Resp: 16   Temp:    SpO2: 100%       Patient Location: PACU      Post Pain: Patient not complaining of pain, continue current therapy    Mental Status: awake    Respiratory Function: tolerating face mask    Cardiovascular: stable    Nausea/Vomiting: patient not complaining of nausea or vomiting    Hydration Status: adequate    Post Assessment: no apparent anesthetic complications          Anesthesia Qualified Clinical Data Registry    Central Line      CVC insertion : NO                                               Perioperative temperature management      General/neuraxial anesthesia > or = 60 minutes (excluding CABG) : YES              > Use of intraoperative active warming : YES              > Temperature > or = 36 degrees Centigrade (96.8 degrees Farenheit) during time span from 30 minutes before up to 15 minutes after anesthesia end time : YES      Administration of antibiotic prophylaxis      Age > or = 18, with IV access, with surgical procedure for which antibiotic prophylaxis indicated, and not on chronic antibiotics : YES              > Prophylactic antibiotics within 1 hour of incision (or fluroroquinolone/vancomycin within 2 hours of incision) : YES    Medication Administration      Ordering or administration of drug inconsistent with intended drug, dose, delivery or timing : NO      Dental loss/damage      Dental injury with administration of anesthesia : NO      Difficult intubation due to unrecognized difficult airway        Elective airway procedure including but not limited to: tracheostomy, fiberoptic bronchoscopy, rigid bronchoscopy; jet ventilation; or elective use of a device to facilitate airway management such as a Glidescope : NO                > Unanticipated difficult  intubation post pre-evaluation : NO      Aspiration of gastric contents        Aspiration of gastric contents : NO                    Surgical fire        Procedure requiring electrocautery/laser : NO                    Immediate perioperative cardiac arrest        Cardiac arrest in OR or PACU : NO                    Unplanned hospital admission        Unplanned hospital admission for initially intended outpatient  anesthesia service : NO      Unplanned ICU admission        Unplanned ICU admission related to anesthesia occurring within 24 hours of induction or start of MAC : NO      Surgical case cancellation        Cancellation of procedure after care already started by anesthesia care team : NO      Post-anesthesia transfer of care checklist/protocol to PACU        Transfer from OR to PACU upon case conclusion : YES              > Use of PACU transfer checklist/protocol : YES     (Includes the key elements of: patient identification, responsible practitioner identification (PACU nurse or advanced practitioner), discussion of pertinent history and procedure course, intraoperative anesthetic management, post-procedure plans, acknowledgement/questions)    Post-anesthesia transfer of care checklist/protocol to ICU        Transfer from OR to ICU upon case conclusion : YES                > Use of ICU transfer checklist/protocol : YES     (Includes the key elements of: patient identification, responsible practitioner identification (ICU nurse or advanced practitioner), discussion of pertinent history and procedure course, intraoperative anesthetic management, post-procedure plans, acknowledgement/questions)     Post-operative nausea/vomiting risk protocol        Post-operative nausea/vomiting risk protocol : NO  Patient > or = 18 with care initiated by anesthesia team that has a risk factor screen for post-op nausea/vomiting (Includes female, hx PONV, or motion sickness, non-smoker, intended opioid administration for  post-op analgesia.)    Anaphylaxis        Anaphylaxis during anesthesia services : NO    (Inclusive of any suspected transfusion reaction in association with blood-bank confirmed blood product incompatibility)              Signed by: Talmage Nap, 05/20/2016 7:29 PM

## 2016-05-20 NOTE — PACU (Signed)
XRay done in main PACU (left hip).

## 2016-05-20 NOTE — Progress Notes (Signed)
[facility]  Progress note    Patient Name: [name], [name]  Date/Time: 05/20/16 10:39 AM  Admitting Physician: Maudry Mayhew, MD  Primary Care Physician: Danford Bad, MD  Location/Room: [phone]       Chief Complaint:     Chief Complaint   Patient presents with   . Unresponsive         Overnight-Extubated.Found to have a left intratrocahnteric fracture.Thi explains why she was not moving her left leg.Unlikely to have had a stroke.She likely had cardiac dysrhytmia that caused loc and then sustained a fracture rather than a stroke.MR brain cancelled.Ortyho to take patient to OR when inr reversed.ffp and vitamin k given along with lasix.  recurrent torsades seen on telemetry 11/28/1.Prolonged QT interval, tikosyn d/c'd by EP cardiology.Can be transferred to medicine service.    HPI:   [name] is a 82 y.o. female provided by husband. They were watching a tv program together and Mrs. Cerro went to another room. about 30-40 minutes later, Mr Sultan went to check on her lying and found on the bathroom floor. "she was unconscious and would not answer", "I know she was breathing, I could see her breathing" he did not notice any spontaneous movement. EMS was called. earlier in the day she had one episode severe vomiting and decreased appetite throughout the day. She was in normal state of health prior to this.INR 3.7Cardioverted times 2 en route for V-tach. May have been a-fib with RVR down BB. Or left ant hemiblock..Intubated for airway protection and to safely facilitate additional work-upCalled by ED, patient evaluated by stroke team. No TPA due to elevated INR, CTA complete and being reviewed to evaluate for large vessel occlusion at time of call.Known PFO see cardiac cath below under radiology.            Problem List:     Active Hospital Problems    Diagnosis   . Ischemic embolic stroke   . Decreased diffusion capacity   . PFO (patent foramen ovale)   . Acquired hypothyroidism   .  Chronic systolic heart failure   . PAH (pulmonary artery hypertension)   . Non-rheumatic mitral regurgitation   . Respiratory failure   . Ischemic stroke   . Altered mental status, unspecified altered mental status type   . CHF (congestive heart failure)   . Atrial fibrillation       Assessment:       Plan:     Cardiovascular:  Hold zocor elevated transaminase . EP cardiology following.has had runs of torsades.ticosyn d/cd.keep mag>2.check last set of troponins.    Pulmonary: extubated.doing well.    Gastrointestinal: NPO except meds. Famotidine 20 mg daily.     Infectious Disease: No issues identified.    Neurologic: although came in as a code stroke,event was not a stroke.coumadin held for orthopeadic surgery.restart when ready from a  surgical standpoint.    Renal: kvo ivf. Check lytes replace as needed    Hem/Onc: vitamin k given yesterday and now ffp.recheck inr.goal is ,1.4 for surgery.    Endocrine: tsh is low but free t4 normal.    Transfer to medicine service on cardiac tele    Code Status: Full Code    Past Medical History:     Past Medical History:   Diagnosis Date   . Abnormal vision     Dry eyes; marginal vision L eye   . Abnormal vision     Wears glasses   . Anemia  controlled on meds   . Aortic valve insufficiency    . Arrhythmia     Atrial fibrillation   . Arthritis     osteoarthritis, back and pelvis   . AVD (aortic valve disease)    . Benign heart murmur    . Bilateral cataracts     surgery, right eye   . Celiac disease    . Chronic obstructive pulmonary disease     mild-mod   . Difficulty walking     Back pain;    . Disease of lung    . Disorder of musculoskeletal system ?    ?   Marland Kitchen Dyspnea on exertion    . Ear, nose and throat disorder     Hoarseness   . Encounter for cardioversion procedure 10/31/2013   . Gastroesophageal reflux disease    . GERD (gastroesophageal reflux disease)     occasional , asymptomatic   . Headache     infrequently   . Heart murmur    . Hyperlipidemia     controlled with  meds   . Hypertension     controlled w/meds   . Hypothyroidism     controlled with meds   . Low back pain    . Mitral valve insufficiency    . Mitral valve prolapse    . Orthopedic aftercare     scoliosis, stenosis, athritis   . Osteoporosis    . Pneumonia     had pneumonia shot 2010 and 2015   . Primary cardiomyopathy    . Scoliosis    . Shortness of breath    . Thyroid disease    . Vein disorder     bilateral legs         Past Surgical History:     Past Surgical History:   Procedure Laterality Date   . ADENOIDECTOMY      as a child   . APPENDECTOMY      as a child   . CARDIAC CATHETERIZATION  2007   . CARDIAC CATHETERIZATION  2014   . CARDIOVERSION      x3 for A Fib   . CATARACT EXTRACTION W/ INTRAOCULAR LENS IMPLANT Right 2001   . COLONOSCOPY     . EYE SURGERY      Cataract R eye, almost no focusand vision in left eye   . JOINT REPLACEMENT Right 10-15-15    TJR,  No Complications   . KNEE SURGERY Left 2002   . TONSILLECTOMY     . TONSILLECTOMY AND ADENOIDECTOMY         Home Medications     Prior to Admission medications    Medication Sig Start Date End Date Taking? Authorizing Provider   acetaminophen (TYLENOL) 500 MG chewable tablet Chew 500 mg by mouth 3 (three) times daily.    [provider]   alendronate (FOSAMAX) 70 MG/75ML solution Take 70 mg by mouth every 7 days.Take in the morning with a full glass of water, on an empty stomach, and do not take anything else by mouth or lie down for the next 30 min.    [provider]   calcium-vitamin D (OSCAL-500 + D) 500-200 MG-UNIT per tablet Take 1 tablet by mouth daily.    [provider]   co-enzyme Q-10 30 MG capsule Take 30 mg by mouth daily.    [provider]   dofetilide (TIKOSYN) 250 MCG capsule TAKE ONE CAPSULE TWO TIMES A DAY BY MOUTH 04/04/16  [provider]   fluticasone (VERAMYST) 27.5 MCG/SPRAY nasal spray 2 sprays by Nasal route daily.    [provider]   furosemide (LASIX) 20 MG tablet Take 20  mg by mouth 2 (two) times daily.    [provider]   gabapentin (NEURONTIN) 100 MG capsule Take 100 mg by mouth 3 (three) times daily.    [provider]   iron-vitamin C (VITRON-C) 65-125 MG Tab Take 1 tablet by mouth every tues and thurs.    [provider]   levothyroxine (SYNTHROID, LEVOTHROID) 112 MCG tablet Take 112 mcg by mouth Once a day at 6:00am.    [provider]   lisinopril (PRINIVIL,ZESTRIL) 5 MG tablet Take 5 mg by mouth daily.    [provider]   magnesium oxide (MAG-OX) 400 MG tablet Take 400 mg by mouth daily.    [provider]   Multiple Vitamin (MULTIVITAMIN) tablet Take 1 tablet by mouth daily.    [provider]   Probiotic Product (ALIGN) 4 MG Cap Take by mouth.    [provider]   simvastatin (ZOCOR) 10 MG tablet Take 10 mg by mouth nightly.    [provider]   warfarin (COUMADIN) 5 MG tablet Take 5 mg by mouth daily.    [provider]        Family History:     Family History   Problem Relation Age of Onset   . Pneumonia Mother    . Heart failure Father    . Hypertension Father    . Colon cancer Neg Hx    . Malignant hyperthermia Neg Hx    . Pseudochol deficiency Neg Hx    . Anesthesia problems Neg Hx        Social History:     Social History     Social History   . Marital status: Married     Spouse name: N/A   . Number of children: N/A   . Years of education: N/A     Social History Main Topics   . Smoking status: Never Smoker   . Smokeless tobacco: Never Used   . Alcohol use Yes      Comment: Rarely   . Drug use: No   . Sexual activity: No     Other Topics Concern   . Not on file     Social History Narrative   . No narrative on file       Allergies:     Allergies   Allergen Reactions   . Erythromycin Hives   . Gluten Meal Other (See Comments)     Has Celiac disease       Review of Systems:   unable tp check    Physical Exam:   Vitals: BP 141/81   Pulse (!) 103   Temp 97.7 F (36.5 C)   Resp  (!) 30   Ht 1.626 m (5' 4.02")   Wt 66.5 kg (146 lb 9.7 oz)   SpO2 100%   BMI 25.15 kg/m   Vent settings:    Admission Weight: Weight: 63.5 kg (139 lb 15.9 oz)    Patient Lines/Drains/Airways Status    Active PICC Line / CVC Line / PIV Line / Drain / Airway / Intraosseous Line / Epidural Line / ART Line / Line / Wound / Pressure Ulcer / NG/OG Tube     Name:   Placement date:   Placement time:   Site:   Days:  Peripheral IV 05/18/16 Right Antecubital  05/18/16    2245    Antecubital    less than 1    Peripheral IV 05/18/16 Left Antecubital  05/18/16        Antecubital    1    NG/OG Tube Orogastric 18 Fr. Left mouth  05/18/16    2314    Left mouth    less than 1    ETT  7.5 mm  05/18/16    2308      less than 1                General Appearance:  chronically ill appearing    Mental status: sedated    Neuro: wide awake,orietnetd to all events and date,perla,eomi.5/5 st on rt u/l and left u ext.letf le externally rotated from fracture.    HEENT: sclera clea,r small contusion hematoma on left a few cm above zygomatic arch.. Mucous membranes moist    Neck: no evidence of elevated JVP    Lungs:CTA no wheeze or rhonchi, equal breath sounds and chest movement    Cardiac: irreg/irreg no murmur    Abdomen: soft, nontender, nondistended, no masses or organomegaly    Extremities: edema present, no cyanosis, no obvious injury or deformity. Scar anterior surface left knee. Recent total knee 07/17.left leg externally rotated.      Labs:     CBC:     Recent Labs  Lab 05/20/16  0416 05/19/16  0414 05/18/16  2307   WBC 5.55 8.20 7.82   RBC 2.85* 2.96* 3.79*   Hgb 9.3* 9.5* 12.1   Hematocrit 28.4* 30.0* 38.2   MCV 99.6 101.4* 100.8*   Platelets 113* 146 179     Chemistry:     Recent Labs  Lab 05/20/16  0416 05/19/16  0413 05/18/16  2307 05/18/16  2306   Sodium 140 141 139  --    Potassium 4.4 3.7 4.0  --    Chloride 112* 113* 106  --    CO2 20* 20* 21*  --    BUN 15.0 22.0* 28.0*  --    Creatinine 0.7 0.8 1.2*  --    Glucose  114* 134* 193*  --    Calcium 7.6* 7.2* 9.2  --    Magnesium 2.1 1.6  --  2.1     LFTs:     Recent Labs  Lab 05/19/16  0413 05/18/16  2307   ALT 98* 96*   AST (SGOT) 163* 153*   Bilirubin, Total 0.7 0.8   Bilirubin, Direct 0.4  --    Albumin 3.0* 3.8   Alkaline Phosphatase 76 86     Cardiac Enzymes:     Recent Labs  Lab 05/19/16  1515 05/19/16  0414   Troponin I 0.24* 0.24*     Coags:     Recent Labs  Lab 05/20/16  0416 05/19/16  1800 05/19/16  0414   PT INR 3.9* 4.2* 4.2*   PT 37.7* 39.5* 40.0*   PTT  --  84*  --        Radiology / Imaging:     Imaging personally reviewed by me, including: XR and ct      Attestation & Billing:       Patient's condition and plan discussed with: family, bedside nurse, emergency physician and respiratory therapy    This patient has a high probability of sudden clinically significant deterioration which requires the highest level of physician preparedness to intervene urgently.  I managed/supervised life or organ supporting interventions that required frequent physician assessments. I devoted my full attention in the ICU to the direct care of this patient for this period of time.    So far today I have spent  45 minutes providing critical  care for this patient exclusive of  teaching, billable procedures, and not overlapping with any other providers.      Signed by: Maudry Mayhew, MD   VW:[id number], Molli Posey, MD

## 2016-05-20 NOTE — Anesthesia Procedure Notes (Signed)
Spinal      Patient location during procedure: OR  Reason for block: Primary Anesthesia In the OR      Start time: 05/20/2016 5:25 PM    End time: 05/20/2016 5:30 PM    Staffing  Anesthesiologist: Fidela Salisbury A  Performed: anesthesiologist       Spinal  Patient monitoring: pulse oximetry, EKG and NIBP      Patient position: right lateral decubitus    Sterile Technique: chlorhexidine gluconate and isopropyl alcohol      Successful attempt  Interspace: L3-4    Approach: midline  Number of attempts: 2      Needle Placement  Needle type: Whitacre tip   Needle gauge: 25              Paresthesia Pain: no    Catheter Placement   CSF Return: Yes  Blood Return: No              Assessment   Block Outcome: patient tolerated procedure well  Additional Notes  3 cc 0.5% bupivacaine

## 2016-05-20 NOTE — Progress Notes (Signed)
[facility]   PROGRESS NOTE     [facility]       Date Time: 05/20/16 9:41 AM Patient Name: [name], [name]  Medical Record #:  [id number] Account#:  [id number] Admission Date:  05/18/2016      Assessment:    Admitted with LOC shocked out of VT by EMS with recurrent torsades seen on telemetry 05/19/16  ? Prolonged QT interval, tikosyn d/c'd   Recurrent atrial fibrillation s/p extensive bilateral rotor modulation with temporal dispersion, pulmonary vein isolation, and right isthmus ablation February 26, 2016.  ? Recurrences postoperatively, one requiring cardioversion March 06, 2016, with only brief recurrences since.  ? Reloaded on Tikosyn post ablation.   Amiodarone d/c'd with severe DLCO abnormality and moderate obstructive disease   CHA2DS2-VASc 3, continues on Coumadin, admitted with supra-therapeutic INR on admission   TEE March 06, 2016, showing a mildly depressed left ventricular function 45-50%, likely tachy-mediated. Then more depressed on echo 10/17 35%, cath at the time showed no sig CAD    Left femur fracture   Depressed TSH, normal free T4, slightly low T3    Recommendations    One episode of sustained VT last night 15 beats, monomorphic.     Today's ECG not available yet, but QT does not appear long on tele   To OR for hip surgery today (moderate cardiac risk), getting FFP reversal   Will continue to follow with you      Patient Active Problem List   Diagnosis   . Celiac disease   . Scoliosis   . Atrial fibrillation   . Mitral valve disorder   . Disease of tricuspid valve   . Chronic airway obstruction, not elsewhere classified   . AF (atrial fibrillation)   . Paroxysmal a-fib   . CHF (congestive heart failure)   . Ischemic embolic stroke   . Decreased diffusion capacity   . PFO (patent foramen ovale)   . Acquired hypothyroidism   . Chronic systolic heart failure   . PAH (pulmonary artery hypertension)   . Non-rheumatic mitral regurgitation   . Respiratory  failure   . Ischemic stroke   . Altered mental status, unspecified altered mental status type         Medications:      Scheduled Meds:      chlorhexidine 15 mL Mouth/Throat Q12H   famotidine 20 mg Oral Daily   Or      famotidine 20 mg Intravenous Daily   fluticasone-salmeterol 2 puff Inhalation BID   gabapentin 100 mg Oral TID   multivitamin-minerals-folic acid-coenzyme q10 1 tablet per OG tube Daily       Continuous Infusions:  . sodium chloride 75 mL/hr at 05/20/16 0700        Subjective:   Left hip pain     Physical Exam:     VITAL SIGNS PHYSICAL EXAM   Temp:  [97.5 F (36.4 C)-98.3 F (36.8 C)] 97.5 F (36.4 C)  Heart Rate:  [79-108] 96  Resp Rate:  [12-36] 22  BP: (108-144)/(60-92) 142/73  FiO2:  [40 %] 40 %  Temp (24hrs), Avg:97.9 F (36.6 C), Min:97.5 F (36.4 C), Max:98.3 F (36.8 C)      Telemetry: Sinus rhythm, NS-VT x 15 beats, monomorphic      Intake/Output Summary (Last 24 hours) at 05/20/16 0941  Last data filed at 05/20/16 0925   Gross per 24 hour   Intake           1686.3  ml   Output              650 ml   Net           1036.3 ml    General: awake, alert, breathing comfortable, uncomfortable position in bed  HEENT: normocephalic, PER, anicteric, no cyanosis.  Mucosa moist but pale.    Cardiovascular: regular rate and rhythm, S1, S2, no S3, no S4  Neck: no JVD  Lungs: clear to auscultation   Abdomen: soft,bowel sound present  Extremities: no edema, left leg rotated laterally  Pulse: equal pulses,  symmetric  Neurological: Alert and oriented Musculoskeletal:  normal tone  Skin:  Warm and dry       Labs:             Recent Labs  Lab 05/20/16  0416   Magnesium 2.1       Recent Labs  Lab 05/20/16  0416 05/19/16  1800   PT 37.7* 39.5*   PT INR 3.9* 4.2*   PTT  --  84*       Recent Labs  Lab 05/20/16  0416 05/19/16  0414 05/18/16  2307   WBC 5.55 8.20 7.82   Hgb 9.3* 9.5* 12.1   Hematocrit 28.4* 30.0* 38.2   Platelets 113* 146 179       Recent Labs  Lab 05/20/16  0416 05/19/16  0413 05/18/16  2307    Sodium 140 141 139   Potassium 4.4 3.7 4.0   Chloride 112* 113* 106   CO2 20* 20* 21*   BUN 15.0 22.0* 28.0*   Creatinine 0.7 0.8 1.2*   EGFR >60.0 >60.0 43.0   Glucose 114* 134* 193*   Calcium 7.6* 7.2* 9.2       Recent Labs  Lab 05/19/16  0413   Thyroid Stimulating Hormone 0.09*         Estimated Creatinine Clearance: 58.1 mL/min (based on SCr of 0.7 mg/dL).      Signed by     Lonzo Cloud, MD    [facility]  [facility]    Arrhythmia Spectralink [phone] (8am-4:30pm)  After hours, non urgent consult line [phone]  After Hours, urgent consults [phone]  For EP questions or consults during the week, easiest to call Spectralink [phone]   You can also reach [place] 24/7 through our main EP office [phone].

## 2016-05-20 NOTE — PACU (Signed)
Updated pt's husband with room number prior her leaving PACU.

## 2016-05-20 NOTE — SLP Progress Note (Signed)
[facility]   Speech Therapy Cancellation Note      Patient:  [name] MRN#:  [id number]  Unit:  [facility] TOWER 7 Room/Bed:  F709/F709.01    05/20/2016  Time: 0840      Patient not seen for re assessment of swallow due to currently being NPO for possible OR this date.  SLP will monitor and f/u when appropriate/able. Discussed with RN, Geraldine Contras via telephone.    Thank you,  Lyndee Leo, MA, CCC-SLP    Pager [phone]

## 2016-05-20 NOTE — Transfer of Care (Signed)
Anesthesia Transfer of Care Note    Patient: [name]    Procedures performed: Procedure(s):  CLOSED REDUCTION, INSERTION, HIP IM NAIL (GAMMA)    Anesthesia type: Spinal    Patient location:Phase I PACU    Last vitals:   Vitals:    05/20/16 1930   BP: (!) 120/96   Pulse: 96   Resp: 12   Temp:    SpO2: 100%       Post pain: Patient not complaining of pain, continue current therapy      Mental Status:awake and alert     Respiratory Function: tolerating face mask    Cardiovascular: stable    Nausea/Vomiting: patient not complaining of nausea or vomiting    Hydration Status: adequate    Post assessment: no apparent anesthetic complications, no reportable events and no evidence of recall    Signed by: Donald Siva   05/20/16 7:31 PM

## 2016-05-20 NOTE — Progress Notes (Signed)
Orthopedic Daily Progress Note    05/20/2016   6:39 AM    [name] is a 82 y.o. female     S/p  L IT fx    Subjective: Pain controlled. NAEON.  Patient Denies nausea, vomiting, or fevers.     Physical Exam:  Vitals:    05/20/16 0600   BP: 140/71   Pulse: 96   Resp: 17   Temp:    SpO2: 100%        Intake/Output Summary (Last 24 hours) at 05/20/16 0639  Last data filed at 05/20/16 0300   Gross per 24 hour   Intake          1462.55 ml   Output              875 ml   Net           587.55 ml       Left Lower Extremity:   skin intact                                                                                                                          5/5 PF/DF/EHL/Q/IP                                                                                                            SILT SP/DP/T                                                                                                                     2+ DP/PT  Cap refill <2 secs      INR 3.9    Results     Procedure Component Value Units Date/Time    Basic Metabolic Panel [[id number]]  (Abnormal) Collected:  05/20/16 0416    Specimen:  Blood Updated:  05/20/16 0527     Glucose 114 (H) mg/dL      BUN 40.9 mg/dL      Creatinine 0.7 mg/dL      Calcium 7.6 (L) mg/dL  Sodium 140 mEq/L      Potassium 4.4 mEq/L      Chloride 112 (H) mEq/L      CO2 20 (L) mEq/L     Magnesium [[id number]] Collected:  05/20/16 0416    Specimen:  Blood Updated:  05/20/16 0527     Magnesium 2.1 mg/dL     GFR [[id number]] Collected:  05/20/16 0416     Updated:  05/20/16 0527     EGFR >60.0    Prothrombin time/INR [[id number]]  (Abnormal) Collected:  05/20/16 0416    Specimen:  Blood Updated:  05/20/16 0452     PT 37.7 (H) sec      PT INR 3.9 (H)     PT Anticoag. Given Within 48 hrs. warfarin (Couma    CBC [[id number]]  (Abnormal) Collected:  05/20/16 0416    Specimen:  Blood from Blood Updated:  05/20/16 0440     WBC 5.55 x10 3/uL      Hgb 9.3 (L) g/dL      Hematocrit 76.2 (L) %       Platelets 113 (L) x10 3/uL      RBC 2.85 (L) x10 6/uL      MCV 99.6 fL      MCH 32.6 (H) pg      MCHC 32.7 g/dL      RDW 14 %      MPV 11.2 fL      Nucleated RBC 0.0 /100 WBC      Absolute NRBC 0.00 x10 3/uL     MRSA culture [[id number]] Collected:  05/19/16 0414    Specimen:  Body Fluid from Nasal/Throat ASC Admission Updated:  05/20/16 0301    Narrative:       ORDER#: [id number]                                    ORDERED BY: Clent Ridges, PATRICK  SOURCE: Nares and Throat                             COLLECTED:  05/19/16 04:14  ANTIBIOTICS AT COLL.:                                RECEIVED :  05/19/16 05:29  Culture MRSA Surveillance                  FINAL       05/20/16 03:01  05/20/16   Negative for Methicillin Resistant Staph aureus from Nares and             Negative for Methicillin Resistant Staph aureus from Throat      T3, Total [[id number]]  (Abnormal) Collected:  05/19/16 1800    Specimen:  Blood Updated:  05/19/16 2204     T3 0.44 (L) ng/mL     T4, free [[id number]] Collected:  05/19/16 1800    Specimen:  Blood Updated:  05/19/16 1912     T4 Free 1.02 ng/dL     PT/APTT [[id number]]  (Abnormal) Collected:  05/19/16 1800     Updated:  05/19/16 1853     PT 39.5 (H) sec      PT INR 4.2 (H)     PT Anticoag. Given Within 48 hrs. warfarin (Couma     PTT 84 (H) sec  Troponin I [[id number]]  (Abnormal) Collected:  05/19/16 1515    Specimen:  Blood Updated:  05/19/16 1626     Troponin I 0.24 (H) ng/mL     Type and Screen [[id number]] Collected:  05/19/16 0630    Specimen:  Blood Updated:  05/19/16 0714     ABO Rh A POS     AB Screen Gel NEG    Vitamin B12 [[id number]] Collected:  05/19/16 0414    Specimen:  Blood Updated:  05/19/16 0651     Vitamin B-12 678 pg/mL           Assessment: s/p L IT fx    Plan:   Mobility: Out of bed as tolerated with PT/OT   Pain control: Continue to wean/titrate to appropriate oral regimen   DVT Prophylaxis (per Ortho Trauma service Protocol): Lovenox for 6 weeks postop (fractures from Pelvis to  subtrochanteric femur)   Foley catheter status: Foley for comfort measures   Further surgical plans: Return to OR today vs tom for L CMN      RUE: WBAT   LUE:  WBAT   RLE:  WBAT   LLE:  NWB   Disposition: Subacute Nursing Facility when medically stable   Will need INR ~1.5/1.6 to proceed with OR   GIM and Cards recs appreciated for clearance, Vit K and FFP    Rosalio Loud PGY-4  Dept of Orthopedics  Page [phone] or call [phone] for questions

## 2016-05-20 NOTE — Anesthesia Preprocedure Evaluation (Addendum)
Anesthesia Evaluation    AIRWAY    Mallampati: I    TM distance: >3 FB  Neck ROM: full  Mouth Opening:full   CARDIOVASCULAR           DENTAL         PULMONARY         OTHER FINDINGS    -  Left ventricle:  -  The ventricle was mildly dilated.  -  Systolic function was markedly reduced. Ejection fraction was estimated to  be 25 %.  -  There was severe hypokinesis.  -  There was an increased relative contribution of atrial contraction to  ventricular filling. -  Doppler parameters were consistent with abnormal left  ventricular relaxation (grade 1 diastolic dysfunction).    -  Aortic valve:  -  There was mild to moderate regurgitation.    -  Atrial septum:  -  There was a probable medium-sized atrial septal defect.  -  There was a mild left-to-right shunt.    COMPARISONS:  LV overall function has decreased from 35 % to 25 %. Pulmonary artery pressure  has decreased.       Admitted with LOC shocked out of VT by EMS with recurrent torsades seen on telemetry 05/19/16  ? Prolonged QT interval, tikosyn d/c'd   Recurrent atrial fibrillation s/pextensive bilateral rotor modulation with temporal dispersion, pulmonary vein isolation, and right isthmus ablation February 26, 2016.  ? Recurrences postoperatively, one requiring cardioversion March 06, 2016, with only brief recurrences since.  ? Reloaded on Tikosyn post ablation.   Amiodarone d/c'dwith severe DLCO abnormality and moderate obstructive disease   CHA2DS2-VASc 3, continues on Coumadin, admitted with supra-therapeutic INR on admission   TEESeptember 15, 2017, showing a mildly depressed left ventricular function 45-50%, likely tachy-mediated. Then more depressed on echo 10/17 35%, cath at the time showed no sig CAD    Left femur fracture   Depressed TSH, normal free T4, slightly low T3    Recommendations    One episode of sustained VT last night 15 beats, monomorphic.     Today's ECG not available yet, but QT does not appear long on tele   To OR for  hip surgery today (moderate cardiac risk), getting FFP reversal   Will continue to follow with you        Relevant Problems   No active problems are marked relevant to this note.               Anesthesia Plan    ASA 4     spinal                     intravenous induction     Monitors/Adjuncts: arterial line      Post op pain management: PNB single shot    informed consent obtained    Plan discussed with CRNA.                   Signed by: Talmage Nap 05/20/16 3:55 PM

## 2016-05-20 NOTE — Brief Op Note (Signed)
BRIEF OP NOTE    Date Time: 05/20/16 7:28 PM    Patient Name:   [name]    Date of Operation:   05/20/2016    Providers Performing:   Surgeon(s):  Stephania Fragmin, DPM  Timoteo Ace, MD    Assistant (s):   Circulator: Jackson Latino, RN  Relief Circulator: Leslie Andrea, RN  Relief Scrub: Burton Apley  Scrub Person: Lauretta Chester  Second Circulator: Oneal Grout, RN    Operative Procedure:   Procedure(s):  CLOSED REDUCTION, INSERTION, HIP IM NAIL (GAMMA)    Preoperative Diagnosis:   Pre-Op Diagnosis Codes:     * Left hip pain [M25.552]    Postoperative Diagnosis:   Post-Op Diagnosis Codes:     * Left hip pain [M25.552]    Anesthesia:   General    Estimated Blood Loss:    200 mL    Implants:     Implant Name Type Inv. Item Serial No. Manufacturer Lot No. LRB No. Used Action   NAIL GAMMA3 LT 13X400MM 120DEG - [id number] Nail NAIL GAMMA3 LT 13X400MM 120DEG  [employer] [id number] Left 1 Implanted   SCREW LAG 10.5X100MM - [id number] Screw SCREW LAG 10.5X100MM  [employer] [id number] Left 1 Implanted   SCREW LCK 5X52.5MM - [id number] Screw SCREW LCK 5X52.5MM  [employer] [id number] Left 1 Implanted   SCREW T2 F/T LCK 5MMX55 - [id number] Screw SCREW T2 F/T LCK 5MMX55   [employer] [id number] Left 1 Implanted       Drains:   Drains: no    Specimens:   None    Findings:   See full op note    Complications:   None      Signed by: Stephania Fragmin, DPM                                                                           [facility]

## 2016-05-20 NOTE — Plan of Care (Signed)
Problem: Safety  Goal: Patient will be free from injury during hospitalization  Outcome: Progressing   05/20/16 1442   Goal/Interventions addressed this shift   Patient will be free from injury during hospitalization  Assess patient's risk for falls and implement fall prevention plan of care per policy;Provide and maintain safe environment;Use appropriate transfer methods;Ensure appropriate safety devices are available at the bedside;Hourly rounding;Include patient/ family/ care giver in decisions related to safety   Pt impulsive at times. Attempts to get up but unable to and states, "why can't I get up?"    Problem: Pain  Goal: Pain at adequate level as identified by patient  Outcome: Progressing   05/20/16 1442   Goal/Interventions addressed this shift   Pain at adequate level as identified by patient Identify patient comfort function goal   Pt states no pain if "not moving".     Problem: Every Day - Stroke  Goal: Neurological status is stable or improving  Outcome: Progressing   05/20/16 1442   Goal/Interventions addressed this shift   Neurological status is stable or improving Monitor/assess/document neurological assessment (Stroke: every 4 hours)   Oriented to self and year. Sometimes year and sometimes place. Oriented to "I broke my hip" see doc flow sheet for full assessment.   Goal: Nutritional intake is adequate  Outcome: Progressing   05/20/16 1442   Goal/Interventions addressed this shift   Nutritional intake is adequate Monitor daily weights   Pt NPO for surgery.     Problem: Nutrition  Goal: Nutritional intake is adequate  Outcome: Progressing   05/20/16 1442   Goal/Interventions addressed this shift   Nutritional intake is adequate Monitor daily weights   Pt NPO for surgery.     Problem: Neurological Deficit  Goal: Neurological status is stable or improving  Outcome: Progressing   05/20/16 1442   Goal/Interventions addressed this shift   Neurological status is stable or improving  Monitor/assess/document neurological assessment (Stroke: every 4 hours)   Oriented to self and year. Sometimes year and sometimes place. Oriented to "I broke my hip" see doc flow sheet for full assessment.     Comments: Report called to pre-op. Pt trasported to pre-op monitored with RN.

## 2016-05-20 NOTE — Progress Notes (Signed)
NEUROLOGY PROGRESS NOTE    Date Time: 05/20/16 8:38 AM  Patient Name: [name],[name]  Attending Physician: Shawnee Knapp, MD    BRIEF HISTORY:   REASON FOR CONSULTATION:  Unwitnessed fall, altered mental status, rule out stroke.     HISTORY OF PRESENT ILLNESS:  The patient is an 82 year old woman with history of prior stroke, mitral  regurgitation, atrial fibrillation on Coumadin, congestive heart failure  who comes into the hospital with unwitnessed fall, left-sided weakness,  concerned about stroke.  The patient is currently intubated but alert.   History is mostly from medical records.  The patient was watching TV with  her husband last night when she went to another room; 30 to 40 minutes  later husband went to check on her and found her lying on the floor.  She  was unconscious and would not answer.  EMS was called.  The patient was  noted to have wide complex tachycardia en route.  This was suspected as  ventricular tachycardia.  She was cardioverted x2.  The patient in the ED  had NIH stroke scale of 21.  CT brain scan negative for any bleed, no TPA  was administered due to her INR of 3.7.  The patient currently is more  awake, responsive, denies pain.  She is intubated in the ICU.         Chief Complaints:   Alert  Left hip fracture    Interval History/24 hour events:   INR noted  Denies headache or confusion  Likely syncope caused fall with left hip fracture  Surgery planned  MRI cancelled as low probability of stroke      Review Of Systems:  No headaches, dizziness, confusion, focal weakness or numbness, left leg unable to use from fracture  No deafness, tinnitus, blurred vision, or vision loss, No double vision or droopy lids  No speech or swallowing problems  Balance poor,+ Falls  No bowel or bladder incontinence  No fever or chills        Physical Exam:     Vitals:    05/20/16 0700   BP: (!) 139/92   Pulse: (!) 108   Resp: 20   Temp:    SpO2: 100%       Intake and Output Summary (Last 24 hours) at  Date Time    Intake/Output Summary (Last 24 hours) at 05/20/16 2130  Last data filed at 05/20/16 0700   Gross per 24 hour   Intake          1576.15 ml   Output              450 ml   Net          1126.15 ml       General: No apparent distress, comfortable appearing, vital signs noted above  Head: Mild bruise right temple and normocephalic  Eyes: Anicteric sclerae, moist conjunctiva, PERRLA   ENT: Oropharynx clear; no erythema or exudates;   Neck: supple; FROM; no mass   Extremities: No clubbing, cyanosis or edema, left hip fracture  CV: Regular rate and rhythm, no murmurs  Lungs: clear  Skin: No rash, lesions, ulcers   Neurological Examination  Mental Status:   Alert, oriented, to time, place, person and situation   Clear speech, normal language, including fluency, naming,     Normal short term and remote memory.  Adequate knowledge.   Affect appropriate   CN 2-12    EOM full, PERL, Normal fields and  fundi, Face symmetric, tongue midline with normal bulk and no fasicualtions  Hearing, facial sensations and Gag reflex intact   Motor system: Normal tone, bulk, no involuntary movements.     Power 5/5 left hip fracture.  No cog wheeling, tremors, or bradykinesia.   Sensory system: Normal touch, pin prick vibration in all 4 extremities; no sensory level   Reflexes:  Deep tendon reflexes symmetric, Negative Babinski  Cerebellar:  Mild tremor  Gait: Not tested  Neurovascular: No bruits.  Signs of meningeal irritation: None        Other pertinent positive findings:       Meds:     Current Facility-Administered Medications   Medication Dose Route Frequency   . chlorhexidine  15 mL Mouth/Throat Q12H   . famotidine  20 mg Oral Daily    Or   . famotidine  20 mg Intravenous Daily   . fluticasone-salmeterol  2 puff Inhalation BID   . gabapentin  100 mg Oral TID   . multivitamin-minerals-folic acid-coenzyme q10  1 tablet per OG tube Daily       Labs:     Results     Procedure Component Value Units Date/Time    Basic Metabolic Panel  [[id number]]  (Abnormal) Collected:  05/20/16 0416    Specimen:  Blood Updated:  05/20/16 0527     Glucose 114 (H) mg/dL      BUN 59.5 mg/dL      Creatinine 0.7 mg/dL      Calcium 7.6 (L) mg/dL      Sodium 638 mEq/L      Potassium 4.4 mEq/L      Chloride 112 (H) mEq/L      CO2 20 (L) mEq/L     Magnesium [[id number]] Collected:  05/20/16 0416    Specimen:  Blood Updated:  05/20/16 0527     Magnesium 2.1 mg/dL     GFR [[id number]] Collected:  05/20/16 0416     Updated:  05/20/16 0527     EGFR >60.0    Prothrombin time/INR [[id number]]  (Abnormal) Collected:  05/20/16 0416    Specimen:  Blood Updated:  05/20/16 0452     PT 37.7 (H) sec      PT INR 3.9 (H)     PT Anticoag. Given Within 48 hrs. warfarin (Couma    CBC [[id number]]  (Abnormal) Collected:  05/20/16 0416    Specimen:  Blood from Blood Updated:  05/20/16 0440     WBC 5.55 x10 3/uL      Hgb 9.3 (L) g/dL      Hematocrit 25.4 (L) %      Platelets 113 (L) x10 3/uL      RBC 2.85 (L) x10 6/uL      MCV 99.6 fL      MCH 32.6 (H) pg      MCHC 32.7 g/dL      RDW 14 %      MPV 11.2 fL      Nucleated RBC 0.0 /100 WBC      Absolute NRBC 0.00 x10 3/uL     MRSA culture [[id number]] Collected:  05/19/16 0414    Specimen:  Body Fluid from Nasal/Throat ASC Admission Updated:  05/20/16 0301    Narrative:       ORDER#: [id number]  ORDERED BY: WALSH, PATRICK  SOURCE: Nares and Throat                             COLLECTED:  05/19/16 04:14  ANTIBIOTICS AT COLL.:                                RECEIVED :  05/19/16 05:29  Culture MRSA Surveillance                  FINAL       05/20/16 03:01  05/20/16   Negative for Methicillin Resistant Staph aureus from Nares and             Negative for Methicillin Resistant Staph aureus from Throat      T3, Total [[id number]]  (Abnormal) Collected:  05/19/16 1800    Specimen:  Blood Updated:  05/19/16 2204     T3 0.44 (L) ng/mL     T4, free [[id number]] Collected:  05/19/16 1800    Specimen:  Blood Updated:  05/19/16 1912      T4 Free 1.02 ng/dL     PT/APTT [[id number]]  (Abnormal) Collected:  05/19/16 1800     Updated:  05/19/16 1853     PT 39.5 (H) sec      PT INR 4.2 (H)     PT Anticoag. Given Within 48 hrs. warfarin (Couma     PTT 84 (H) sec     Troponin I [[id number]]  (Abnormal) Collected:  05/19/16 1515    Specimen:  Blood Updated:  05/19/16 1626     Troponin I 0.24 (H) ng/mL             Imaging personally reviewed, including:     Radiology Results (24 Hour)     Procedure Component Value Units Date/Time    XR Femur Left AP And Lateral [[id number]] Collected:  05/19/16 1957    Order Status:  Completed Updated:  05/19/16 2004    Narrative:       CLINICAL HISTORY: Pain.    Frontal and crosstable lateral radiographs of the left femur were  obtained. There is no prior study available for comparison. Comparison  is made to a study of the left hip on 05/19/2016. This study is limited  due to the patient's inability to maintain the positions.    Again identified is an intratrochanteric fracture of the left femur. The  remainder of the visualized femoral shaft is intact. There is soft  tissue swelling of the level of the hip.      Impression:         Acute intratrochanteric fracture of the left femur. The remainder is as  above.    Stephannie Peters, MD   05/19/2016 8:00 PM      XR Hip left 2-3 vw with pelvis [[id number]] Collected:  05/19/16 1742    Order Status:  Completed Updated:  05/19/16 1751    Narrative:       CLINICAL HISTORY: Pain.Marland Kitchen    Four views of the left hip which include a frontal radiograph of the  pelvis were obtained. COMPARISON:  None.    There is an acute intratrochanteric fracture of the left femur. The  proximal fracture fragment is approximately 2.3 cm laterally placed. The  femoral head is within its normal location. No lytic or sclerotic  osseous lesion is seen. The right hip is  intact. Evaluation of the upper  pelvis is limited due to overlying bowel gas and stool.      Impression:         Acute intratrochanteric  fracture of the left femur. Findings were  discussed with and acknowledged by the nurse Katy at 5:46 PM on  05/19/2016.    Stephannie Peters, MD   05/19/2016 5:47 PM      US Venous Low Extrem Duplx Dopp Comp Bilat [[id number]] Collected:  05/19/16 1353    Order Status:  Completed Updated:  05/19/16 1358    Narrative:       Clinical history: Bilateral lower extremity swelling.    COMPARISON: None.    The right and left common femoral through posterior tibial and peroneal  veins are patent and fully compressible, without intraluminal defect.  Normal venous flow is demonstrated within these vessels.      Impression:        No sonographic evidence for right or left lower extremity  deep venous thrombosis.    Al Decant, MD   05/19/2016 1:54 PM      CT Angiogram Head Neck [[id number]] Collected:  05/19/16 0935    Order Status:  Completed Updated:  05/19/16 0946    Narrative:       HISTORY: Unresponsive. TIA versus cerebral infarct.    COMPARISON: None.    TECHNIQUE: A noncontrast head CT was performed. A CTA of the neck and a  CTA of the intracranial circulation were performed. The CTA examinations  consist of contrast-enhanced axial source images as well as multiplanar  2-D, MIP, and 3-D images obtained by post processing the data on an  independent workstation. Additionally, a contrast-enhanced head CT was  performed at the conclusion procedure. The patient received 100 ml of  Visipaque 320 contrast material intravenously. The following dose  reduction techniques were utilized: automated exposure control and/or  adjustment of the mA and/or KV according to patient size, and the use of  an iterative reconstruction technique.       FINDINGS: Both ET and NG tubes are in place.     No intracranial mass, hemorrhage, or hydrocephalus is detected.  The  basilar cisterns are clear.    After the administration of contrast material there is a normal  enhancement pattern the brain parenchyma, meningeal surfaces, and  major  dural venous sinuses.     CTA of the neck demonstrates that the  major arterial vessels originate  from the aortic arch in a normal fashion.  Both common carotid arteries  and their bifurcations are widely patent. There is a small amount of  calcified atherosclerotic change of the bifurcation the common carotid  artery and proximal internal carotid artery bilaterally. Only minimal  stenosis is seen in both carotid bulbs.    The cervical portion of the internal carotid artery appears to be normal  bilaterally.  Both vertebral arteries are patent in the neck.  Both  vertebral arteries have a congenitally small caliber.     CTA of the intracranial arterial circulation demonstrates the both  distal vertebral arteries are patent. The vertebral arteries join to  form the basilar artery.  The basilar artery is patent though it has a  congenitally small caliber. The P1 segment of the posterior cerebral  artery has a small caliber bilaterally. The primary arterial inflow into  the posterior cerebral artery is from the anterior arterial circulation  via the ipsilateral posterior communicating artery bilaterally. This is  a  common anatomic variation.      The posterior cerebral arteries have a normal appearance.    Both distal internal carotid arteries are patent. Each distal internal  carotid artery gives rise to an ACA and MCA branch.  The left and right  ACA and MCA branches appear patent.    The anterior communicating artery is normal in appearance. The left and  the right posterior communicating arteries are within the range of  normal in appearance.      No cerebral aneurysm is detected.    Because of the arterial phase of the injection and acquisition, the  venous structures are not visualized.      Impression:          1. The major arterial vessels of the neck and intracranial circulation  are widely patent as discussed in detail above.  2. No intracranial hemorrhage is seen.  3. Given the symptoms  , followup  MRI of the brain without and with  contrast material could be obtained.    Theodoro Doing, MD   05/19/2016 9:42 AM      CT Head W Contrast [[id number]] Collected:  05/19/16 0935    Order Status:  Completed Updated:  05/19/16 0946    Narrative:       HISTORY: Unresponsive. TIA versus cerebral infarct.    COMPARISON: None.    TECHNIQUE: A noncontrast head CT was performed. A CTA of the neck and a  CTA of the intracranial circulation were performed. The CTA examinations  consist of contrast-enhanced axial source images as well as multiplanar  2-D, MIP, and 3-D images obtained by post processing the data on an  independent workstation. Additionally, a contrast-enhanced head CT was  performed at the conclusion procedure. The patient received 100 ml of  Visipaque 320 contrast material intravenously. The following dose  reduction techniques were utilized: automated exposure control and/or  adjustment of the mA and/or KV according to patient size, and the use of  an iterative reconstruction technique.       FINDINGS: Both ET and NG tubes are in place.     No intracranial mass, hemorrhage, or hydrocephalus is detected.  The  basilar cisterns are clear.    After the administration of contrast material there is a normal  enhancement pattern the brain parenchyma, meningeal surfaces, and major  dural venous sinuses.     CTA of the neck demonstrates that the  major arterial vessels originate  from the aortic arch in a normal fashion.  Both common carotid arteries  and their bifurcations are widely patent. There is a small amount of  calcified atherosclerotic change of the bifurcation the common carotid  artery and proximal internal carotid artery bilaterally. Only minimal  stenosis is seen in both carotid bulbs.    The cervical portion of the internal carotid artery appears to be normal  bilaterally.  Both vertebral arteries are patent in the neck.  Both  vertebral arteries have a congenitally small caliber.     CTA of the  intracranial arterial circulation demonstrates the both  distal vertebral arteries are patent. The vertebral arteries join to  form the basilar artery.  The basilar artery is patent though it has a  congenitally small caliber. The P1 segment of the posterior cerebral  artery has a small caliber bilaterally. The primary arterial inflow into  the posterior cerebral artery is from the anterior arterial circulation  via the ipsilateral posterior communicating artery bilaterally. This is  a common  anatomic variation.      The posterior cerebral arteries have a normal appearance.    Both distal internal carotid arteries are patent. Each distal internal  carotid artery gives rise to an ACA and MCA branch.  The left and right  ACA and MCA branches appear patent.    The anterior communicating artery is normal in appearance. The left and  the right posterior communicating arteries are within the range of  normal in appearance.      No cerebral aneurysm is detected.    Because of the arterial phase of the injection and acquisition, the  venous structures are not visualized.      Impression:          1. The major arterial vessels of the neck and intracranial circulation  are widely patent as discussed in detail above.  2. No intracranial hemorrhage is seen.  3. Given the symptoms  , followup MRI of the brain without and with  contrast material could be obtained.    Theodoro Doing, MD   05/19/2016 9:42 AM              Assessment:     Patient Active Problem List   Diagnosis   . Celiac disease   . Scoliosis   . Atrial fibrillation   . Mitral valve disorder   . Disease of tricuspid valve   . Chronic airway obstruction, not elsewhere classified   . AF (atrial fibrillation)   . Paroxysmal a-fib   . CHF (congestive heart failure)   . Ischemic embolic stroke   . Decreased diffusion capacity   . PFO (patent foramen ovale)   . Acquired hypothyroidism   . Chronic systolic heart failure   . PAH (pulmonary artery hypertension)   .  Non-rheumatic mitral regurgitation   . Respiratory failure   . Ischemic stroke   . Altered mental status, unspecified altered mental status type     Fall  Syncope  Left hip fracture  Coagulopathy      Plan/Recommendations:   General measures:  Diet/activity/Precuations: Per routine from admitting orders  Symptomatic measures: as needed  Diagnostics:Reviewed CT/CTA  Medications:Noted  Consultation:Ortho for hip surgery  Physical/Occupational/Speech Therapy:As needed  Patient/Family Education/Counseling:Provided        Anticipated discharge disposition and date: Unknown     Case discussed with: Nursing/Attending        Signed by: Shawnee Knapp, MD

## 2016-05-21 LAB — BASIC METABOLIC PANEL
BUN: 19 mg/dL (ref 7.0–19.0)
CO2: 23 mEq/L (ref 22–29)
Calcium: 7.9 mg/dL (ref 7.9–10.2)
Chloride: 109 mEq/L (ref 100–111)
Creatinine: 0.7 mg/dL (ref 0.6–1.0)
Glucose: 146 mg/dL — ABNORMAL HIGH (ref 70–100)
Potassium: 3.9 mEq/L (ref 3.5–5.1)
Sodium: 141 mEq/L (ref 136–145)

## 2016-05-21 LAB — CBC
Absolute NRBC: 0 10*3/uL
Hematocrit: 22 % — ABNORMAL LOW (ref 37.0–47.0)
Hgb: 7 g/dL — ABNORMAL LOW (ref 12.0–16.0)
MCH: 32.4 pg — ABNORMAL HIGH (ref 28.0–32.0)
MCHC: 31.8 g/dL — ABNORMAL LOW (ref 32.0–36.0)
MCV: 101.9 fL — ABNORMAL HIGH (ref 80.0–100.0)
MPV: 11.4 fL (ref 9.4–12.3)
Nucleated RBC: 0 /100 WBC (ref 0.0–1.0)
Platelets: 111 10*3/uL — ABNORMAL LOW (ref 140–400)
RBC: 2.16 10*6/uL — ABNORMAL LOW (ref 4.20–5.40)
RDW: 14 % (ref 12–15)
WBC: 5.6 10*3/uL (ref 3.50–10.80)

## 2016-05-21 LAB — ECG 12-LEAD
Atrial Rate: 95 {beats}/min
P-R Interval: 172 ms
Q-T Interval: 376 ms
QRS Duration: 118 ms
QTC Calculation (Bezet): 472 ms
R Axis: -43 degrees
T Axis: 114 degrees
Ventricular Rate: 95 {beats}/min

## 2016-05-21 LAB — PT/INR
PT INR: 1.2 — ABNORMAL HIGH (ref 0.9–1.1)
PT: 15.2 s — ABNORMAL HIGH (ref 12.6–15.0)

## 2016-05-21 LAB — GFR: EGFR: >60

## 2016-05-21 LAB — MAGNESIUM: Magnesium: 2.2 mg/dL (ref 1.6–2.6)

## 2016-05-21 NOTE — Plan of Care (Signed)
Problem: Every Day - Stroke  Goal: Neurological status is stable or improving  Outcome: Progressing  Care Plan Notes          [facility] Nursing Progress Note    KENNETH LAX is a 82 y.o. female  Admitted 05/18/2016 10:42 PM (Hospital day 2)    Indication for continued [facility] status:  Elevated INR - Now 1.2    Major Shift Events:  Pt arrived from PACU around 2330.    Around 0300 woke up slightly disoriented trying to get OOB to use the bathroom, removed EKG leads, IV, and 2/3 L hip dressings - RN redressed with gauze/tegaderm and notified ortho trauma PA. Pt was easily reoriented and instructed to use call bell and foley to urinate.     Review of Systems  Neuro:  A&Ox4, FC, moves BUE and RLE. LLE nerve block. Pt drowsy but easily aroused.     Cardiac:  Afebrile, SA 80-90's, SBP 110-130's. Pulses palpable, no edema.     Respiratory:  2L NC beginning of shift, weaned to RA - no desats.     GI/GU:  No diet order? Foley with minimal UOP.    BM this shift? No    Skin Assessment  3 L hip dressings (gauze and tegaderm), no drainage noted.       LDAs  Patient Lines/Drains/Airways Status    Active Lines, Drains and Airways     Name:   Placement date:   Placement time:   Site:   Days:    Peripheral IV 05/21/16 Right Forearm  05/21/16    0312    Forearm    less than 1    Urethral Catheter 16 Fr.  05/20/16    0830        less than 1              Indication for Foley and estimated target removal date?  Foley for immobilization from trauma surgeries, TBD.       Psycho/Social:  Calm/cooperative    Problem: Neurological Deficit  Goal: Neurological status is stable or improving  Outcome: Progressing  Care Plan Notes          [facility] Nursing Progress Note    IEESHA ABBASI is a 83 y.o. female  Admitted 05/18/2016 10:42 PM (Hospital day 2)    Indication for continued [facility] status:  Elevated INR - Now 1.2    Major Shift Events:  Pt arrived from PACU around 2330.    Around 0300 woke up slightly disoriented trying to get OOB to use the bathroom,  removed EKG leads, IV, and 2/3 L hip dressings - RN redressed with gauze/tegaderm and notified ortho trauma PA. Pt was easily reoriented and instructed to use call bell and foley to urinate.     Review of Systems  Neuro:  A&Ox4, FC, moves BUE and RLE. LLE nerve block. Pt drowsy but easily aroused.     Cardiac:  Afebrile, SA 80-90's, SBP 110-130's. Pulses palpable, no edema.     Respiratory:  2L NC beginning of shift, weaned to RA - no desats.     GI/GU:  No diet order? Foley with minimal UOP.    BM this shift? No    Skin Assessment  3 L hip dressings (gauze and tegaderm), no drainage noted.       LDAs  Patient Lines/Drains/Airways Status    Active Lines, Drains and Airways     Name:   Placement date:   Placement time:  Site:   Days:    Peripheral IV 05/21/16 Right Forearm  05/21/16    0312    Forearm    less than 1    Urethral Catheter 16 Fr.  05/20/16    0830        less than 1              Indication for Foley and estimated target removal date?  Foley for immobilization from trauma surgeries, TBD.       Psycho/Social:  Calm/cooperative

## 2016-05-21 NOTE — PT Eval Note (Addendum)
[facility]   Physical Therapy Evaluation   Patient: [name]    MRN#: [id number]   Unit: [facility] TOWER 4  Bed: F441/F441.01        Discharge Recommendations:   Discharge Recommendation: SNF   DME Recommendation: DME Recommended for Discharge:  (TBD at rehab)      If the above recommended discharge disposition is not available patient will need Moderate assistance for ambulation and transfers, BSC, manual WC, shower chair equipment, transport into home and HHPT.                                                   Discharge recommendations are subject to change based on patient's progress / regression. Please refer to the most recent treatment note for the current recommendation. Thank You!     Assessment:   [name] is a 82 y.o. female admitted 05/18/2016 s/p fall in bathroom with resultant L intertrochanteric femur fracture.  Pt presents with decreased strength L LE, decreased endurance and increased pain significantly limiting functional mobility. Pt required MinA supine to sit and ModA sit to stand with VCs for hand and foot placement. Pt with R lateral lean with stance and decreased weight shift over L LE 2/2 pain. Pt required Mod/MaxA for stand pivot transfer from bed to chair. Pt would benefit from continued skilled PT to maximize functional mobility and independence.     Impairments: Assessment: Decreased LE ROM;Decreased LE strength;Decreased endurance/activity tolerance;Decreased functional mobility;Decreased balance;Gait impairment.     Rehabilitation Potential: Prognosis: Good;With continued PT status post acute discharge    Treatment Activities:  PT Evaluation. Investment banker, operational.  Educated the patient to role of physical therapy, plan of care, goals of therapy and Hendricks recommendations and safety with mobility.    Plan:   Treatment/Interventions: Exercise;Gait training;Stair training;Functional transfer training;LE strengthening/ROM;Endurance training;Patient/family  training;Equipment eval/education;Bed mobility;Compensatory technique education     PT Frequency: 3-4x/wk   Risks/Benefits/POC Discussed with Pt/Family: With patient/family        Precautions and Contraindications:   Weight Bearing Status: LLE WBAT  Other Precautions:  (falls)    Consult received for [name] for PT Evaluation and Treatment.  Patient's medical condition is appropriate for Physical therapy intervention at this time.    Medical Diagnosis: Elevated INR [R79.1]  Atrial fibrillation with RVR [I48.91]  Altered mental status, unspecified altered mental status type [R41.82]  Hemiparesis of left nondominant side, unspecified hemiparesis etiology [G81.94]  Ischemic embolic stroke [I63.9]  Unresponsiveness [R41.89]      History of Present Illness:   [name] is a 82 y.o. female admitted on 05/18/2016 s/p fall in bathroom found by husband unconscious. L intertrochanteric femur fracture on imaging and POD #1 procedure listed below    Procedure(s):  CLOSED REDUCTION, INSERTION, HIP IM NAIL (GAMMA)    Past Medical/Surgical History:  PMH  AFIB  AVD  COPD  GERD   Scoliosis  Thyroid disease  Mitral Valve insufficiency    PSH  Cardiac CAth [phone]  R TJR 2017  L Knee surgery 2002     X-Rays/Tests/Labs:  IMPRESSION:    Expected immediate postoperative appearance of the left hip  and femur.    IMPRESSION:   Acute intratrochanteric fracture of the left femur. The remainder is as  Above.    IMPRESSION:  1. The major arterial vessels of the neck and intracranial circulation  are widely patent as discussed in detail above.  2. No intracranial hemorrhage is seen.  3. Given the symptoms  , followup MRI of the brain without and with  contrast material could be obtained.    Social History:   Prior Level of Function:  Prior level of function: Independent with ADLs, Ambulates independently  Baseline Activity Level: Community ambulation  DME Currently at Home: Single point cane, Front wheel walker    Home  Living Arrangements:  Living Arrangements: Spouse/significant other  Type of Home: House  Home Layout: Performs ADL's on one level, One level, Stairs to enter without rails (add number in comment) (2)  DME Currently at Home: Single point cane, Front wheel walker  Home Living - Notes / Comments:  (Husband home to assist)    Subjective:   Patient is agreeable to participation in the therapy session. Nursing clears patient for therapy.     "I looks really swollen."    Patient Goal:  (I want to go home)    Pain Assessment  Pain Assessment: Numeric Scale (0-10)  Pain Score: 8-severe pain  Pain Location:  (L LE)  Pain Frequency: Increases with movement  Pain Intervention(s): Medication (See eMAR);Cold applied;Repositioned;Ambulation/increased activity;Emotional support;Elevated    Objective:   Observation of Patient/Vital Signs:  Patient is in bed with telemetry, SCD's, peripheral IV and indwelling urinary catheter in place.    Observation of Patient/Vital signs:  Inspection/Posture:  ([facility] monitors, Foley, PIV)     Vital Signs  Supine BP: 133/73  Seated BP following transfer: 97/55  Seated BP following transfer after 1 min w/ LEs elevated: 99/58 - pt asymptomatic RN informed    Cognition/Neuro Status  Arousal/Alertness: Appropriate responses to stimuli  Attention Span: Appears intact  Orientation Level: Oriented X4  Memory: Appears intact  Following Commands: Follows all commands and directions without difficulty  Safety Awareness: minimal verbal instruction         Musculoskeletal Examination:  Gross ROM  Neck/Trunk ROM: within functional limits  Right Upper Extremity ROM: within functional limits  Left Upper Extremity ROM: within functional limits  Right Lower Extremity ROM: within functional limits  Left Lower Extremity ROM:  (AAROM for full knee extension)    Gross Strength  Neck/Trunk Strength: WFL  Right Upper Extremity Strength: within functional limits  Left Upper Extremity Strength: within functional  limits  Right Lower Extremity Strength: 4+/5 (Quads, DF/PF)  Left Lower Extremity Strength: 3-/5 (Quads, 4/5 DF)    Functional Mobility:  Supine to Sit: Minimal Assist;Increased Time;Increased Effort;HOB raised;to Right  Scooting to EOB: Contact Guard Assist  Sit to Stand: Moderate Assist;bed elevated;with instruction for hand placement to increase safety  Stand to Sit: Minimal Assist    Transfers  Bed to Chair: Moderate Assist;Maximal Assist  Stand Pivot Transfers: Moderate Assist  Device Used for Functional Transfer: front-wheeled walker    Ambulation:   UTA - increased pain     Balance:  Sitting - Static: Good  Sitting - Dynamic: Good  Standing - Static: Fair (R lateral lean, kyphotic posture)  Standing - Dynamic: Poor (at FWW decreased ability to weight shift over L LE)    PMP - Progressive Mobility Protocol   PMP Activity: Step 5 - Chair       Participation and Activity Tolerance:  Participation Effort: good  Endurance: Tolerates 10 - 20 min exercise with multiple rests (orthostatic BP - RN informed)  Patient left with call bell within reach, SCDs in place, chair alarm on, bed alarm n/a, fall mats in place all needs met and all questions answered, RN notified of session outcome and patient response.      Goals:   Goals  Goal Formulation: With patient/family  Time for Goal Acheivement: 5 visits  Goals: Select goal  Pt Will Go Supine To Sit: with contact guard assist;to maximize functional mobility and independence  Pt Will Perform Sit to Stand: with contact guard assist;to maximize functional mobility and independence  Pt Will Transfer Bed/Chair: with minimal assist;to maximize functional mobility and independence  Pt Will Ambulate: 101-150 feet;with rolling walker;with minimal assist;to maximize functional mobility and independence  Pt Will Go Up / Down Stairs: 1-2 stairs;with minimal assist;With rail;to maximize functional mobility and independence         Time of treatment:   PT Received On:  05/21/16  Start Time: 1050  Stop Time: 1140  Time Calculation (min): 50 min      Clent Jacks PT, DPT   [phone]

## 2016-05-21 NOTE — Progress Notes (Signed)
MEDICINE PROGRESS NOTE    Date Time: 05/21/16 5:02 PM  Patient Name: Michalski,Sharman Y  Attending Physician: Janalee Dane, MD    Assessment:   Active Problems:    Atrial fibrillation    CHF (congestive heart failure)    Ischemic embolic stroke    Decreased diffusion capacity    PFO (patent foramen ovale)    Acquired hypothyroidism    Chronic systolic heart failure    PAH (pulmonary artery hypertension)    Non-rheumatic mitral regurgitation    Respiratory failure    Ischemic stroke    Altered mental status, unspecified altered mental status type  Resolved Problems:    * No resolved hospital problems. *    82 y.o. female with left displaced reverse obliquity intertrochanteric femur fracture and VT    Plan:     1) Left displaced intertrochenteric fracture - s/p closed reduction - PT/OT working with pt and recommended subacute rehab - pain well controlled   lovenox for 6 weeks     2) VT and torsades ; ep on board  amio dcd  Sec to lung disease  QTc stable now     3) Recurrent afib : On lovenox - coumadin on hold for now     4)Hypothyroidism : ON replacement     5) Disp : Placement   Case discussed with: RN and pt     Safety Checklist:     DVT prophylaxis:  CHEST guideline (See page [id number]) Chemical   Foley:  Wyatt Rn Foley protocol Not present   IVs:  Peripheral IV   PT/OT: Ordered   Daily CBC & or Chem ordered:  SHM/ABIM guidelines (see #5) Yes, due to clinical and lab instability   Reference for approximate charges of common labs: CBC auto diff - $76  BMP - $99  Mg - $79    Lines:     Patient Lines/Drains/Airways Status    Active PICC Line / CVC Line / PIV Line / Drain / Airway / Intraosseous Line / Epidural Line / ART Line / Line / Wound / Pressure Ulcer / NG/OG Tube     Name:   Placement date:   Placement time:   Site:   Days:    Peripheral IV 05/21/16 Right Forearm  05/21/16    0312    Forearm    less than 1    Incision Site 05/20/16 Hip Left  05/20/16    1643      1                 Disposition: (Please see  PAF column for Expected D/C Date)   Today's date: 05/21/2016  Admit Date: 05/18/2016 10:42 PM  LOS: 2  Clinical Milestones: On lovenox- needs placement   Anticipated discharge needs:placement       Subjective     CC: <principal problem not specified>    Interval History/24 hour events: has not been getting out of bed - no fever chills abd pain nausea vomiting or diarhea - pain well controlled- husband at the bedside      HPI/Subjective: came in with shock for VT    Review of Systems:   As per HPI     Physical Exam:     VITAL SIGNS PHYSICAL EXAM   Temp:  [97.3 F (36.3 C)-98.5 F (36.9 C)] 98.4 F (36.9 C)  Heart Rate:  [85-100] 90  Resp Rate:  [12-24] 17  BP: (104-139)/(62-96) 104/63  Blood Glucose:  Intake/Output Summary (Last 24 hours) at 05/21/16 1702  Last data filed at 05/21/16 1100   Gross per 24 hour   Intake          1756.25 ml   Output             1075 ml   Net           681.25 ml    Physical Exam  General: awake, alert X o3  Cardiovascular: regular rate and rhythm, no murmurs, rubs or gallops  Lungs: clear to auscultation bilaterally, without wheezing, rhonchi, or rales  Abdomen: soft, non-tender, non-distended; no palpable masses,  normoactive bowel sounds  Extremities: no edema- limited         Meds:     Medications were reviewed:    Labs:     Labs (last 72 hours):      Recent Labs  Lab 05/21/16  0254 05/20/16  0416   WBC 5.60 5.55   Hgb 7.0* 9.3*   Hematocrit 22.0* 28.4*   Platelets 111* 113*         Recent Labs  Lab 05/21/16  0254 05/20/16  1247  05/19/16  1800   PT 15.2* 17.5* More results in Results Review 39.5*   PT INR 1.2* 1.4* More results in Results Review 4.2*   PTT  --   --   --  84*   More results in Results Review = values in this interval not displayed.   Recent Labs  Lab 05/21/16  0254 05/20/16  0416 05/19/16  0413 05/18/16  2307   Sodium 141 140 141 139   Potassium 3.9 4.4 3.7 4.0   Chloride 109 112* 113* 106   CO2 23 20* 20* 21*   BUN 19.0 15.0 22.0* 28.0*   Creatinine 0.7 0.7  0.8 1.2*   Calcium 7.9 7.6* 7.2* 9.2   Albumin  --   --  3.0* 3.8   Protein, Total  --   --  4.7* 6.2   Bilirubin, Total  --   --  0.7 0.8   Alkaline Phosphatase  --   --  76 86   ALT  --   --  98* 96*   AST (SGOT)  --   --  163* 153*   Glucose 146* 114* 134* 193*                       Signed by: Janalee Dane, MD

## 2016-05-21 NOTE — Progress Notes (Signed)
NEUROLOGY PROGRESS NOTE    Date Time: 05/21/16 8:28 AM  Patient Name: [name],[name]  Attending Physician: Shawnee Knapp, MD    BRIEF HISTORY:   REASON FOR CONSULTATION:  Unwitnessed fall, altered mental status, rule out stroke.     HISTORY OF PRESENT ILLNESS:  The patient is an 82 year old woman with history of prior stroke, mitral  regurgitation, atrial fibrillation on Coumadin, congestive heart failure  who comes into the hospital with unwitnessed fall, left-sided weakness,  concerned about stroke.  The patient is currently intubated but alert.   History is mostly from medical records.  The patient was watching TV with  her husband last night when she went to another room; 30 to 40 minutes  later husband went to check on her and found her lying on the floor.  She  was unconscious and would not answer.  EMS was called.  The patient was  noted to have wide complex tachycardia en route.  This was suspected as  ventricular tachycardia.  She was cardioverted x2.  The patient in the ED  had NIH stroke scale of 21.  CT brain scan negative for any bleed, no TPA  was administered due to her INR of 3.7.  The patient currently is more  awake, responsive, denies pain.  She is intubated in the ICU.         Chief Complaints:   Alert, dry mouth  Post surgery  Left hip fracture      Interval History/24 hour events:   In [facility]  Denies headache or confusion  Likely syncope caused fall with left hip fracture  MRI cancelled as low probability of stroke      Review Of Systems:  No headaches, dizziness, confusion, focal weakness or numbness  No deafness, tinnitus, blurred vision, or vision loss, No double vision or droopy lids  No speech or swallowing problems  Balance poor,+ Falls  No bowel or bladder incontinence  No fever or chills        Physical Exam:     Vitals:    05/21/16 0800   BP:    Pulse:    Resp:    Temp: 98.2 F (36.8 C)   SpO2:        Intake and Output Summary (Last 24 hours) at Date Time    Intake/Output Summary  (Last 24 hours) at 05/21/16 1610  Last data filed at 05/21/16 0600   Gross per 24 hour   Intake          2862.25 ml   Output             1650 ml   Net          1212.25 ml       General: No apparent distress, comfortable appearing, vital signs noted above  Head: Mild bruise right temple and normocephalic  Eyes: Anicteric sclerae, moist conjunctiva, PERRLA   ENT: Oropharynx clear; no erythema or exudates;   Neck: supple; FROM; no mass   Extremities: No clubbing, cyanosis or edema, left hip fracture  CV: Regular rate and rhythm, no murmurs  Lungs: clear  Skin: No rash, lesions, ulcers   Neurological Examination  Mental Status:   Alert, oriented, to time, place, person and situation, confused about surgery   Clear speech, normal language, including fluency, naming,     Normal short term and remote memory.  Adequate knowledge.   Affect appropriate   CN 2-12    EOM full, PERL, Normal fields and  fundi, Face symmetric, tongue midline with normal bulk and no fasicualtions  Hearing, facial sensations and Gag reflex intact   Motor system: Normal tone, bulk, no involuntary movements.     Power 4/5.  No cog wheeling, tremors, or bradykinesia.   Sensory system: Normal touch, pin prick vibration in all 4 extremities; no sensory level   Reflexes:  Deep tendon reflexes symmetric, Negative Babinski  Cerebellar:  Mild tremor  Gait: Not tested  Neurovascular: No bruits.  Signs of meningeal irritation: None        Other pertinent positive findings:       Meds:     Current Facility-Administered Medications   Medication Dose Route Frequency   . calcium citrate-Vitamin D  2 tablet Oral Daily   . chlorhexidine  15 mL Mouth/Throat Q12H   . famotidine  20 mg Oral Daily    Or   . famotidine  20 mg Intravenous Daily   . fluticasone-salmeterol  2 puff Inhalation BID   . gabapentin  100 mg Oral TID   . levothyroxine  112 mcg Oral Daily at 0600   . multivitamin-minerals-folic acid-coenzyme q10  1 tablet per OG tube Daily   . senna-docusate  2  tablet Oral BID   . simvastatin  10 mg Oral QHS       Labs:     Results     Procedure Component Value Units Date/Time    Prothrombin time/INR [[id number]]  (Abnormal) Collected:  05/21/16 0254    Specimen:  Blood Updated:  05/21/16 0358     PT 15.2 (H) sec      PT INR 1.2 (H)     PT Anticoag. Given Within 48 hrs. warfarin (Couma    Basic Metabolic Panel [[id number]]  (Abnormal) Collected:  05/21/16 0254    Specimen:  Blood Updated:  05/21/16 0340     Glucose 146 (H) mg/dL      BUN 78.2 mg/dL      Creatinine 0.7 mg/dL      Calcium 7.9 mg/dL      Sodium 956 mEq/L      Potassium 3.9 mEq/L      Chloride 109 mEq/L      CO2 23 mEq/L     Magnesium [[id number]] Collected:  05/21/16 0254    Specimen:  Blood Updated:  05/21/16 0340     Magnesium 2.2 mg/dL     GFR [[id number]] Collected:  05/21/16 0254     Updated:  05/21/16 0340     EGFR >60.0    CBC [[id number]]  (Abnormal) Collected:  05/21/16 0254    Specimen:  Blood from Blood Updated:  05/21/16 0331     WBC 5.60 x10 3/uL      Hgb 7.0 (L) g/dL      Hematocrit 27.2 (L) %      Platelets 111 (L) x10 3/uL      RBC 2.16 (L) x10 6/uL      MCV 101.9 (H) fL      MCH 32.4 (H) pg      MCHC 31.8 (L) g/dL      RDW 14 %      MPV 11.4 fL      Nucleated RBC 0.0 /100 WBC      Absolute NRBC 0.00 x10 3/uL     Hemoglobin and hematocrit, blood [[id number]] Collected:  05/21/16 0254    Specimen:  Blood Updated:  05/21/16 0252    Narrative:       -Post op day one  Prepare fresh frozen plasma [[id number]] Collected:  05/20/16 0525    Specimen:  Blood Updated:  05/21/16 0042     Plasma Plasma     BLUNIT [id number]     Status transfused     PRODUCT CODE (NON READABLE) F6213Y86     Expiration Date 201712021551     UTYPE A POS     Plasma Plasma     BLUNIT [id number]     Status transfused     PRODUCT CODE (NON READABLE) X3244W10     Expiration Date 201712021551     UTYPE A POS     Plasma Plasma     BLUNIT [id number]     Status transfused     PRODUCT CODE (NON READABLE) Q2595G38     Expiration  Date 201712021551     UTYPE A POS     Plasma Plasma     BLUNIT [id number]     Status transfused     PRODUCT CODE (NON READABLE) E2121V00     Expiration Date 201712021551     UTYPE A POS    Troponin I [[id number]]  (Abnormal) Collected:  05/20/16 1248    Specimen:  Blood Updated:  05/20/16 1331     Troponin I 0.15 (H) ng/mL     Prothrombin time/INR [[id number]]  (Abnormal) Collected:  05/20/16 1247    Specimen:  Blood Updated:  05/20/16 1325     PT 17.5 (H) sec      PT INR 1.4 (H)     PT Anticoag. Given Within 48 hrs. warfarin (Couma            Imaging personally reviewed, including:     Radiology Results (24 Hour)     Procedure Component Value Units Date/Time    XR Femur Left AP And Lateral [[id number]] Collected:  05/20/16 2135    Order Status:  Completed Updated:  05/20/16 2141    Narrative:       Clinical history: Left hip fracture.    COMPARISON: Preoperative study from 05/19/2016.    AP and frog-leg views of the left hip and four additional views of the  left femur demonstrate placement of intact intramedullary rod and screw  hardware across a known fracture in the intertrochanteric and  subtrochanteric regions of the proximal left femur. Alignment is  near-anatomic. There is soft tissue air and surgical staples at the  operative site. The joint spaces are well-maintained. No new abnormality  seen.      Impression:        Expected immediate postoperative appearance of the left hip  and femur.    Al Decant, MD   05/20/2016 9:37 PM      XR Hip left 2-3 vw with pelvis [[id number]] Collected:  05/20/16 2135    Order Status:  Completed Updated:  05/20/16 2141    Narrative:       Clinical history: Left hip fracture.    COMPARISON: Preoperative study from 05/19/2016.    AP and frog-leg views of the left hip and four additional views of the  left femur demonstrate placement of intact intramedullary rod and screw  hardware across a known fracture in the intertrochanteric and  subtrochanteric regions of the  proximal left femur. Alignment is  near-anatomic. There is soft tissue air and surgical staples at the  operative site. The joint spaces are well-maintained. No new abnormality  seen.      Impression:        Expected immediate postoperative appearance of the  left hip  and femur.    Al Decant, MD   05/20/2016 9:37 PM      FL Fluoro > 1 Hour [[id number]] Collected:  05/20/16 1901    Order Status:  Completed Updated:  05/20/16 1905    Narrative:       History: Surgery.    Fluoroscopy: Fluoroscopic guidance was provided for this operative  procedure, performed without the presence of a radiologist.  Fluoroscopy time: 101.6 seconds  Number of fluoroscopic images obtained: 14      Impression:        Surgery under fluoroscopic guidance.          Al Decant, MD   05/20/2016 7:01 PM              Assessment:     Patient Active Problem List   Diagnosis   . Celiac disease   . Scoliosis   . Atrial fibrillation   . Mitral valve disorder   . Disease of tricuspid valve   . Chronic airway obstruction, not elsewhere classified   . AF (atrial fibrillation)   . Paroxysmal a-fib   . CHF (congestive heart failure)   . Ischemic embolic stroke   . Decreased diffusion capacity   . PFO (patent foramen ovale)   . Acquired hypothyroidism   . Chronic systolic heart failure   . PAH (pulmonary artery hypertension)   . Non-rheumatic mitral regurgitation   . Respiratory failure   . Ischemic stroke   . Altered mental status, unspecified altered mental status type     Fall  Syncope  Left hip fracture  Coagulopathy      Plan/Recommendations:   General measures:  Diet/activity/Precuations: Per routine from admitting orders  Symptomatic measures: as needed  Diagnostics:Reviewed CT/CTA  Medications:Noted  Consultation:Ortho for hip surgery  Physical/Occupational/Speech Therapy:As needed  Patient/Family Education/Counseling:Provided        Anticipated discharge disposition and date: Unknown     Case discussed with:  Nursing/Team        Signed by: Shawnee Knapp, MD

## 2016-05-21 NOTE — Discharge Instr - AVS First Page (Addendum)
For rehab facility/SNF:  - Patient needs daily INR checks. Goal INR is between 2 and 3. If she is not at goal by 05/29/16, she should be started on Lovenox 40mg  SQ QDay until her INR is within goal. Once her INR is at goal, Lovenox should be stopped.                      ORTHOPEDIC TRAUMA SERVICE DISCHARGE INSTRUCTIONS    [facility]             [address]            [place], [place] [address]          [phone] Phone, [phone] Fax      Please contact the office as soon as possible to schedule a follow-up appointment with:  Dr. Chevis Pretty, MD    You will need to be seen approximately 2 weeks after your discharge from the hospital  1 Day Post-Op  S/p Procedure(s) (LRB):  CLOSED REDUCTION, INSERTION, HIP IM NAIL (GAMMA) (Left)   Your staples/sutures will be removed at one of your office visits. Do not allow anyone else to remove your staples.    Activity Weight Bearing Status    Weight bearing as tolerated .     Medications    If you have a pain catheter or pump - still take your pain medications as prescribed, even if you feel you do not need them.  Once the pain catheter/pump is discontinued or stops working, it wears off extremely quickly and you will experience pain that is difficult to control if you have not been using your pain medications as prescribed.     If you believe you are having a problem with the pain catheter/pump, please call the number included in the pain pump instructions.  If you feel there is something wrong with your cast/splint/injury site, then please call the office line above to be seen, or come to the emergency department.     You should be taking a stool softener (ex. Colace) as long as you are on any narcotic pain medicines.  You should be having a bowel movement every 2-3 days.  If not, then  proceed with over the counter laxatives (Senokot -S/Miralax), enemas, or suppositories to fix the constipation.    You may take tylenol per package  directions provided your pain medication does not have any tylenol in it (Percocet, Vicodin, and Norco all have tylenol. Do not take more than 3000 mg in a 24 hr period.    Do not use NSAID for the first 3 weeks of healing your fracture.  If you have had an operation on your fracture, you should not use NSAIDs for 3 weeks after your surgery.     Skilled Nursing Facility Patients  If wound develops erythema or drainage, please do not start on antibiotics, please send patient to Orthopaedic office for a wound check.     Wound Care  -  Change your dressings as needed with sterile gauze (do not have to change if clean and dry).  -  If you have soft dressings only (no splints or casts), remove dressing and  you may take a shower  5 days after surgery (no baths). Do not scrub incision. Pat dry with a clean towel.  -  Do not remove splints/casts. This will be addressed at our follow-up appointment. If you feel that your cast/splint may be too tight  or there maybe another issue with it, please call the office to be seen or come to the emergency room.   -  Keep your splint/cast clean and dry.  -  If you have increased drainage, incision redness, foul smell, or fevers - inform the      office.  You may need to be evaluated in the office earlier than your follow-up      appointment.    Diet  -  You may resume your normal diet.  -  It is quite common to have a decreased appetite for the first week or two following       your injury/surgery.  This may be due to your pain level and the pain medications you      are taking.  Be sure to drink plenty of fluids during this time to remain hydrated.   -  You should be taking a stool softener (ex. Colace) as long as you are on any narcotic      pain medicines.  You should be having a bowel movement every 2-3 days.  If not, then       proceed with over the counter laxatives, enemas, or suppositories to fix the      constipation     X-Rays (Radiology Studies)  -  In most cases, we will  perform post-op x-rays at our office at the time of your follow-up      appointment.  Check with your health insurance carrier if this is permitted.  If not, they      can provide a list of providers which are covered.   -  If your x-rays (or MRI or CT) are to be performed anywhere other than our office, you         must make sure they give you the actual x-rays or a CD-ROM to bring with you to the         office visit.  A radiology report is not adequate; the surgeon must visualize the actual      images to determine your plan of care.     [facility] Patients  All outpatient radiology studies (X-ray, CT, MRI, etc) and physical therapy will need to be obtained at a [facility]  You may take the prescriptions which you received to your primary physician, so that they can enter them as a referral in the [facility] system.  You will need to bring the actual films or a CD-ROM with you to the follow-up appointment.  Radiology reports are not sufficient    [facility] Patients  If you have an upcoming planned surgery, you must go to your primary care physician and get a referral for the surgery.  Start this process ASAP, as it takes up to a week to accomplish.       Physical/Occupational Therapy  During the rehabilitation course from your injury, we may prescribe physical or occupational therapy.  In these cases, we will provide you with a list of therapists in the area.  This list in not comprehensive, and you are welcome to go to any therapist that you like - however, you must see if they accept your insurance, or you will be expected to pay at the time services are rendered.     The [facility] are all part of the same healthcare system as [facility] and our office.  These centers accept most health insurance plans, or will participate in the financial  arrangement (charity/discounted payment plan) that was put into effect when you presented to [facility].    Blood Clot  Prevention  You may be given a prescription for a blood thinner (Lovenox) that is meant to minimize your chances for developing blood clots.  These clots are call Deep Venous Thrombosis (DVT) or Pulmonary Embolus (PE).  While preventative measures are not 100% effective, they greatly decrease the chance of developing a DVT or PE.  Usually, when prescribed, we recommend using this for a total of 3 weeks post operatively for fractures below the hip, and 6 weeks for hip/acetabulum/pelvis fractures. Sometimes the course of this medicine will be longer, depending on your injuries.  The nurse in the hospital will teach you the proper technique to do these injections yourself at home.     If you choose not to have this prescription filled, you are at risk of having a VTE (blood clot in leg, arm, or chest). If you do not get this prescription filled, then we strongly suggest that you take an aspirin (325 mg) per day.  Aspirin, while not as effective against the formation of VTE, may have some protective qualities.       Special Instructions: Call asap to arrange your f/u appt.     [email or web address]

## 2016-05-21 NOTE — Plan of Care (Signed)
Problem: Safety  Goal: Patient will be free from injury during hospitalization  Outcome: Progressing      Problem: Pain  Goal: Pain at adequate level as identified by patient  Outcome: Progressing   05/21/16 1632   Goal/Interventions addressed this shift   Pain at adequate level as identified by patient Identify patient comfort function goal;Reassess pain within 30-60 minutes of any procedure/intervention, per Pain Assessment, Intervention, Reassessment (AIR) Cycle;Evaluate patient's satisfaction with pain management progress       Comments: Care Plan Notes          [facility] Nursing Progress Note    [name] is a 82 y.o. female  Admitted 05/18/2016 10:42 PM (Hospital day 2)    Indication for continued [facility] status:  Close monitoring for INR     Major Shift Events:  None    Review of Systems  Neuro:  A0x4, FC, MAE but left leg is weak due to surgery/fracture    Cardiac:  SA and afebrile    Respiratory:  RA with no desats    GI/GU:  Removed foley and gluten free diet. x1 BM    BM this shift? yes    Skin Assessment  Intact with dressing on left side of led/hip, dressings clean with no drainage      LDAs  Patient Lines/Drains/Airways Status    Active Lines, Drains and Airways     Name:   Placement date:   Placement time:   Site:   Days:    Peripheral IV 05/21/16 Right Forearm  05/21/16    0312    Forearm    less than 1                    Indication for Central Access and estimated target removal date?  NA      Indication for Foley and estimated target removal date?  NA      Psycho/Social:  NA

## 2016-05-21 NOTE — Progress Notes (Signed)
Orthopedic Daily Progress Note    05/21/2016   6:12 AM    [name] is a 82 y.o. female  1 Day Post-Op  S/p Procedure(s) (LRB):  CLOSED REDUCTION, INSERTION, HIP IM NAIL (GAMMA) (Left) L IT fx    Subjective: Patient relates pain well controlled overnight.  Endorses mild headache this morning. Patient Denies nausea, vomiting, or fevers.     Physical Exam:  Vitals:    05/21/16 0600   BP: 125/70   Pulse: 97   Resp: 17   Temp:    SpO2: 100%        Intake/Output Summary (Last 24 hours) at 05/21/16 0612  Last data filed at 05/21/16 0600   Gross per 24 hour   Intake          3062.25 ml   Output             1575 ml   Net          1487.25 ml       Left Lower Extremity:   Dressings c/d/i                                                                                                                      Wiggles toes well                                                                                                           SILT SP/DP/T                                                                                                                     2+ DP/PT  Cap refill <2 secs      Results     Procedure Component Value Units Date/Time    Prothrombin time/INR [[id number]]  (Abnormal) Collected:  05/21/16 0254    Specimen:  Blood Updated:  05/21/16 0358     PT 15.2 (H) sec      PT INR 1.2 (H)     PT Anticoag. Given Within 48 hrs. warfarin (  Couma    Basic Metabolic Panel [id number]  (Abnormal) Collected:  05/21/16 0254    Specimen:  Blood Updated:  05/21/16 0340     Glucose 146 (H) mg/dL      BUN 09.8 mg/dL      Creatinine 0.7 mg/dL      Calcium 7.9 mg/dL      Sodium 119 mEq/L      Potassium 3.9 mEq/L      Chloride 109 mEq/L      CO2 23 mEq/L     Magnesium [[id number]] Collected:  05/21/16 0254    Specimen:  Blood Updated:  05/21/16 0340     Magnesium 2.2 mg/dL     GFR [[id number]] Collected:  05/21/16 0254     Updated:  05/21/16 0340     EGFR >60.0    CBC [[id number]]  (Abnormal) Collected:  05/21/16 0254    Specimen:   Blood from Blood Updated:  05/21/16 0331     WBC 5.60 x10 3/uL      Hgb 7.0 (L) g/dL      Hematocrit 13.2 (L) %      Platelets 111 (L) x10 3/uL      RBC 2.16 (L) x10 6/uL      MCV 101.9 (H) fL      MCH 32.4 (H) pg      MCHC 31.8 (L) g/dL      RDW 14 %      MPV 11.4 fL      Nucleated RBC 0.0 /100 WBC      Absolute NRBC 0.00 x10 3/uL     Hemoglobin and hematocrit, blood [[id number]] Collected:  05/21/16 0254    Specimen:  Blood Updated:  05/21/16 0252    Narrative:       -Post op day one    Prepare fresh frozen plasma [[id number]] Collected:  05/20/16 0525    Specimen:  Blood Updated:  05/21/16 0042     Plasma Plasma     BLUNIT [id number]     Status transfused     PRODUCT CODE (NON READABLE) P2951O84     Expiration Date 201712021551     UTYPE A POS     Plasma Plasma     BLUNIT [id number]     Status transfused     PRODUCT CODE (NON READABLE) N2355D32     Expiration Date 201712021551     UTYPE A POS     Plasma Plasma     BLUNIT [id number]     Status transfused     PRODUCT CODE (NON READABLE) E8315V76     Expiration Date 201712021551     UTYPE A POS     Plasma Plasma     BLUNIT [id number]     Status transfused     PRODUCT CODE (NON READABLE) E2121V00     Expiration Date 201712021551     UTYPE A POS    Troponin I [[id number]]  (Abnormal) Collected:  05/20/16 1248    Specimen:  Blood Updated:  05/20/16 1331     Troponin I 0.15 (H) ng/mL     Prothrombin time/INR [[id number]]  (Abnormal) Collected:  05/20/16 1247    Specimen:  Blood Updated:  05/20/16 1325     PT 17.5 (H) sec      PT INR 1.4 (H)     PT Anticoag. Given Within 48 hrs. warfarin (Couma          Assessment: s/p Procedure(s) (LRB):  CLOSED REDUCTION, INSERTION, HIP IM NAIL (GAMMA) (Left)L  IT fx    Plan:   Mobility: Out of bed as tolerated with PT/OT   Pain control: Continue to wean/titrate to appropriate oral regimen   DVT Prophylaxis (per Ortho Trauma service Protocol): Lovenox for 6 weeks postop (fractures from Pelvis to subtrochanteric femur)   Foley  catheter status: Per protocol   Further surgical plans: No further Orthopedic plans      RUE: WBAT   LUE:  WBAT   RLE:  WBAT   LLE:  WBAT   Disposition: Skilled Nursing Facility when medically stable       [employer]  Dept of Orthopedics  Page [phone] or call [phone] for questions

## 2016-05-21 NOTE — OT Eval Note (Signed)
[facility]   Occupational Therapy Evaluation     Patient: [name]    MRN#: [id number]   Unit: [facility] TOWER 4  Bed: F441/F441.01                                     Discharge Recommendations:   Discharge Recommendation: SNF   DME Recommended for Discharge:  (tbd)    If SNF recommended discharge disposition is not available, patient will need max hands on assist for all ADLs/transfers, bsc, rw, ttb equipment, and HH OT.       Assessment:   [name] is a 82 y.o. female admitted 05/18/2016 with decreased independence w/ADLs and functional transfers related to pain, dec balance, impaired strength, dec activity tolerance. Pt is WBAT LLE, but struggles to weight shift to take steps.  At baseline, pt is typically ind with self-cares, and pt currently requires max A assist with ADLs, and modA with functional transfers.  Pt would benefit from continued OT services to maximize pt functional independence and safety with ADLs and transfers.      Therapy Diagnosis: dec ADLs/transfers    Rehabilitation Potential: good    Treatment Activities: OT eval, ADLs/trasnfers  Educated the patient to role of occupational therapy, plan of care, goals of therapy and safety with mobility and ADLs, home safety.    Plan:   OT Frequency Recommended: 3-4x/wk     Treatment/Interventions: ADLs, functional transfers, balance training, therex/strengthening, equipment/DME recs and education, pt/family education, neuro re-ed, functional endurance      Risks/benefits/POC discussed w/pt who verbalizes understanding       Precautions and Contraindications:   Falls  WBAT LLE    Consult received for [name] for OT Evaluation and Treatment.  Patient's medical condition is appropriate for Occupational Therapy intervention at this time.      History of Present Illness:    [name] is a 82 y.o. female admitted on 05/18/2016 with fall at home, initially not moving L side, brought in for  CVA.  The patient was  noted to have wide complex tachycardia en route. This was suspected as  ventricular tachycardia. She was cardioverted x2.    Found to have left displaced reverse obliquity intertrochanteric femur fracture    S/p IM nailing      Admitting Diagnosis: Elevated INR [R79.1]  Atrial fibrillation with RVR [I48.91]  Altered mental status, unspecified altered mental status type [R41.82]  Hemiparesis of left nondominant side, unspecified hemiparesis etiology [G81.94]  Ischemic embolic stroke [I63.9]  Unresponsiveness [R41.89]    Past Medical/Surgical History:  Past Medical History:   Diagnosis Date   . Abnormal vision     Dry eyes; marginal vision L eye   . Abnormal vision     Wears glasses   . Anemia     controlled on meds   . Aortic valve insufficiency    . Arrhythmia     Atrial fibrillation   . Arthritis     osteoarthritis, back and pelvis   . AVD (aortic valve disease)    . Benign heart murmur    . Bilateral cataracts     surgery, right eye   . Celiac disease    . Chronic obstructive pulmonary disease     mild-mod   . Difficulty walking     Back pain;    . Disease of lung    .  Disorder of musculoskeletal system ?    ?   Marland Kitchen Dyspnea on exertion    . Ear, nose and throat disorder     Hoarseness   . Encounter for cardioversion procedure 10/31/2013   . Gastroesophageal reflux disease    . GERD (gastroesophageal reflux disease)     occasional , asymptomatic   . Headache     infrequently   . Heart murmur    . Hyperlipidemia     controlled with meds   . Hypertension     controlled w/meds   . Hypothyroidism     controlled with meds   . Low back pain    . Mitral valve insufficiency    . Mitral valve prolapse    . Orthopedic aftercare     scoliosis, stenosis, athritis   . Osteoporosis    . Pneumonia     had pneumonia shot 2010 and 2015   . Primary cardiomyopathy    . Scoliosis    . Shortness of breath    . Thyroid disease    . Vein disorder     bilateral legs         Imaging/Tests/Labs:    Xr Femur Left Ap  And Lateral    Result Date: 05/19/2016  Acute intratrochanteric fracture of the left femur. The remainder is as above. Stephannie Peters, MD 05/19/2016 8:00 PM     Ct Head Without Contrast    Result Date: 05/18/2016  No acute intracranial abnormality. If continued clinical concern for acute stroke, MRI is more sensitive. These findings were discussed with Dr. Arlana Lindau at 05/18/2016 11:40 PM. Johnsie Kindred, MD 05/18/2016 11:40 PM     Xr Hip Left 2-3 Vw With Pelvis    Result Date: 05/19/2016  Acute intratrochanteric fracture of the left femur. Findings were discussed with and acknowledged by the nurse Katy at 5:46 PM on 05/19/2016. Stephannie Peters, MD 05/19/2016 5:47 PM         Social History:   Prior Level of Function: ind  Assistive Devices: non  Baseline Activity: comm ambulation  DME Currently at Home: spc, rw  Home Living Arrangements: w/spouse  Type of Home: house  Home Layout: one level home    Subjective:  "I can't believe how swollen my leg is"    Patient is agreeable to participation in the therapy session. Nursing clears patient for therapy.     Patient Goal: get better  Pain:   Scale: 5/10-  Location: L hip  Intervention: meds per RN    Objective:   Patient is in bed with dressings, telemetry and SCD's in place.      Cognitive Status and Neuro Exam:  Aox4, cooperative and pleasant    Musculoskeletal Examination  RUE ROM: wfl  LUE ROM: wfl  RLE ROM: wfl  LLE ROM: wfl    RUE Strength: wfl  LUE Strength: wfl  RLE Strength: 4/5  LLE Strength: 3/5      Sensory/Oculomotor Examination  Auditory: intact  Tactile: intact BUE  Vision: at baseline      Activities of Daily Living  Eating: Ind in supine  Grooming: SU at EOB  Bathing: max A d/t pain  UE Dressing: Min A at EOB  LE Dressing: max A B socks  Toileting: max A, BM on commode    Functional Mobility:  Supine to Sit: min A for LLE advancement  Sit to Stand: cga from EOB  Transfers: min to mod A, EOB to/from [facility]  PMP - Progressive Mobility Protocol    PMP Activity: Step 5 - Chair      Balance  Static Sitting: sba  Dynamic Sitting: sba  Static Standing: min a   Dynamic Standing: mod A    Participation and Activity Tolerance  Participation Effort: good  Endurance: good    Patient left with call bell within reach, all needs met, SCDs on, fall mat in place, bed alarm on, and all questions answered. RN notified of session outcome and patient response.       Goals:  Time For Goal Achievement: 5 visits  ADL Goals  Patient will groom self: at sinkside, Supervision (standing)  Patient will dress lower body: with AE, Supervision  Patient will toilet: Supervision  Mobility and Transfer Goals  Pt will perform functional transfers: Supervision                         Leward Quan, OTR/L 4:34 PM  [id number]            Time of treatment:   OT Received On: 05/21/16  Start Time: 1400  Stop Time: 1440  Time Calculation (min): 40 min

## 2016-05-21 NOTE — Progress Notes (Addendum)
[facility]   PROGRESS NOTE     [facility]       Date Time: 05/21/16 1:32 PM Patient Name: [name], [name]  Medical Record #:  [id number] Account#:  [id number] Admission Date:  05/18/2016      Assessment:    Admitted with LOC shocked out of VT by EMS with recurrent torsades seen on telemetry 05/19/16  ? Prolonged QT interval, tikosyn d/c'd   Recurrent atrial fibrillation s/p extensive bilateral rotor modulation with temporal dispersion, pulmonary vein isolation, and right isthmus ablation February 26, 2016.  ? Recurrences postoperatively, one requiring cardioversion March 06, 2016, with only brief recurrences since.  ? Reloaded on Tikosyn post ablation.   Amiodarone d/c'd with severe DLCO abnormality and moderate obstructive disease   CHA2DS2-VASc 3, continues on Coumadin, admitted with supra-therapeutic INR on admission   TEE March 06, 2016, showing a mildly depressed left ventricular function 45-50%, likely tachy-mediated. Then more depressed on echo 10/17 35%, cath at the time showed no sig CAD    Left femur fracture   Depressed TSH, normal free T4, slightly low T3    Recommendations    Brief PAT occ PVC/couplets, no further VT.     QTc is stable at   INR subtherapeutic, restart coumadin once cleared, continue off tikosyn      Patient Active Problem List   Diagnosis   . Celiac disease   . Scoliosis   . Atrial fibrillation   . Mitral valve disorder   . Disease of tricuspid valve   . Chronic airway obstruction, not elsewhere classified   . AF (atrial fibrillation)   . Paroxysmal a-fib   . CHF (congestive heart failure)   . Ischemic embolic stroke   . Decreased diffusion capacity   . PFO (patent foramen ovale)   . Acquired hypothyroidism   . Chronic systolic heart failure   . PAH (pulmonary artery hypertension)   . Non-rheumatic mitral regurgitation   . Respiratory failure   . Ischemic stroke   . Altered mental status, unspecified altered mental status type          Medications:      Scheduled Meds:        calcium citrate-Vitamin D 2 tablet Oral Daily   chlorhexidine 15 mL Mouth/Throat Q12H   famotidine 20 mg Oral Daily   Or      famotidine 20 mg Intravenous Daily   fluticasone-salmeterol 2 puff Inhalation BID   gabapentin 100 mg Oral TID   levothyroxine 112 mcg Oral Daily at 0600   multivitamin-minerals-folic acid-coenzyme q10 1 tablet per OG tube Daily   senna-docusate 2 tablet Oral BID   simvastatin 10 mg Oral QHS       Continuous Infusions:  . sodium chloride 75 mL/hr at 05/21/16 0125        Subjective:   Left hip pain     Physical Exam:     VITAL SIGNS PHYSICAL EXAM   Temp:  [97.3 F (36.3 C)-98.5 F (36.9 C)] 98.2 F (36.8 C)  Heart Rate:  [85-114] 100  Resp Rate:  [12-27] 23  BP: (120-146)/(62-96) 125/74  Temp (24hrs), Avg:97.7 F (36.5 C), Min:97.3 F (36.3 C), Max:98.5 F (36.9 C)      Telemetry: Sinus rhythm, NS-VT x 15 beats, monomorphic      Intake/Output Summary (Last 24 hours) at 05/21/16 1332  Last data filed at 05/21/16 1100   Gross per 24 hour   Intake  1756.25 ml   Output             1175 ml   Net           581.25 ml    General: awake, alert, breathing comfortable, uncomfortable position in bed  HEENT: normocephalic, PER, anicteric, no cyanosis.  Mucosa moist but pale.    Cardiovascular: regular rate and rhythm, S1, S2, no S3, no S4  Neck: no JVD  Lungs: clear to auscultation   Abdomen: soft,bowel sound present  Extremities: no edema, left leg rotated laterally  Pulse: equal pulses,  symmetric  Neurological: Alert and oriented Musculoskeletal:  normal tone  Skin:  Warm and dry       Labs:             Recent Labs  Lab 05/21/16  0254   Magnesium 2.2       Recent Labs  Lab 05/21/16  0254  05/19/16  1800   PT 15.2* More results in Results Review 39.5*   PT INR 1.2* More results in Results Review 4.2*   PTT  --   --  84*   More results in Results Review = values in this interval not displayed.    Recent Labs  Lab 05/21/16  0254 05/20/16  0416  05/19/16  0414   WBC 5.60 5.55 8.20   Hgb 7.0* 9.3* 9.5*   Hematocrit 22.0* 28.4* 30.0*   Platelets 111* 113* 146       Recent Labs  Lab 05/21/16  0254 05/20/16  0416 05/19/16  0413   Sodium 141 140 141   Potassium 3.9 4.4 3.7   Chloride 109 112* 113*   CO2 23 20* 20*   BUN 19.0 15.0 22.0*   Creatinine 0.7 0.7 0.8   EGFR >60.0 >60.0 >60.0   Glucose 146* 114* 134*   Calcium 7.9 7.6* 7.2*       Signed by     Edythe Lynn, PA-C    [facility]  [facility]    Arrhythmia Spectralink [phone] (8am-4:30pm)  After hours, non urgent consult line [phone]  After Hours, urgent consults [phone]    My a/p as above. RRR Mental status about back to baseline per husband. Rhythm remains nsr with tendency toward st ie 90s to low 100s, with occasional pac's on tele. rec continue stop tikosyn entirely; will not start a new antiarrhythmic at this time but will monitor. Resume oral anticoag when able. QT better on ECG.  Thanks, Particia Lather, MD  [facility]  [phone]; [phone] FFX Spectralink.

## 2016-05-21 NOTE — SLP Progress Note (Signed)
[facility]   SLP Treatment Note  Patient: [name]    MRN#: [id number]     Treatment Type:  Dysphagia f/u    Recommendations/Plan:   - Diet Recommendations:  Regular solids with thin liquids.  Meds whole with liquids (single pill per swallow)  - Aspiration Precautions: Awake/alert and upright ~90 degrees (as tolerated).  Frequent oral care    SLP Frequency Recommended: 2x per week    Discharge recommendations: Home with supervision/assistance    Assessment:   Patient demonstrating improved alertness, strength and swallow function this date.  Patient presents with mild oral and suspect pharyngeal dysphagia (age appropriate and likely exacerbated by generalized weakness) No clinical s/s aspiration across textures. Swallow initiation appears timely. Patient does fatigue quickly and requires frequent breaks.  Recommend initiate regular solid diet with thin liquids.  Recommend small, frequent snacks/meals with frequent breaks.  Assist with cutting up solids and positioning to upright position as needed.  SLP will f/u for tolerance of diet.  D/C with any decline in status or s/s aspiration.     Subjective:   Pain: no c/o pain, mentions discomfort when moving, which resolves quickly.   Current Diet: NPO / ice   Respiratory Status: RA  Precautions: falls  RN cleared patient for treatment. Patent agreeable to treatment. Requesting "A good gluten free breakfast"  Patient left with call bell within reach, all needs met, SCDs in place , fall mat in place, bed alarm activated and all questions answered. RN notified of session outcome and patient response.     Objective:   Objective:   Patient requires gluten free diet.    Presented with ice, thins by cup/straw, puree and gluten free Rice Krispie cereal with milk (mixed texture).    Adequate mastication and manipulation of bolus. Timely trigger of swallow, adequate laryngeal excursion upon palpation. No cough, throat clear or congestion. Voice is loud.   Adequate containment of bolus and adequate motor speech.       Patient Education: verbal    Goals:   Pt will tolerate regular solids with thin liquids with no clinical s/s aspiration x 24-48 hours.      Lyndee Leo, MA, CCC-SLP    Pager [phone]    Time of Treatment:  SLP Received On: 05/21/16  Start Time: 0945  Stop Time: 1005  Time Calculation (min): 20 min

## 2016-05-22 ENCOUNTER — Encounter: Payer: Self-pay | Admitting: Orthopaedic Surgery

## 2016-05-22 ENCOUNTER — Inpatient Hospital Stay: Payer: Medicare Other

## 2016-05-22 DIAGNOSIS — S72102A Unspecified trochanteric fracture of left femur, initial encounter for closed fracture: Secondary | ICD-10-CM

## 2016-05-22 LAB — CBC
Absolute NRBC: 0.02 10*3/uL — ABNORMAL HIGH
Hematocrit: 21.4 % — ABNORMAL LOW (ref 37.0–47.0)
Hgb: 6.8 g/dL — ABNORMAL LOW (ref 12.0–16.0)
MCH: 32.1 pg — ABNORMAL HIGH (ref 28.0–32.0)
MCHC: 31.8 g/dL — ABNORMAL LOW (ref 32.0–36.0)
MCV: 100.9 fL — ABNORMAL HIGH (ref 80.0–100.0)
MPV: 11 fL (ref 9.4–12.3)
Nucleated RBC: 0.3 /100 WBC (ref 0.0–1.0)
Platelets: 144 10*3/uL (ref 140–400)
RBC: 2.12 10*6/uL — ABNORMAL LOW (ref 4.20–5.40)
RDW: 14 % (ref 12–15)
WBC: 7.87 10*3/uL (ref 3.50–10.80)

## 2016-05-22 LAB — PREPARE RBC
Expiration Date: 201712162359
Status: TRANSFUSED
UTYPE: A POS

## 2016-05-22 LAB — BASIC METABOLIC PANEL
BUN: 28 mg/dL — ABNORMAL HIGH (ref 7.0–19.0)
CO2: 22 mEq/L (ref 22–29)
Calcium: 7.9 mg/dL (ref 7.9–10.2)
Chloride: 110 mEq/L (ref 100–111)
Creatinine: 0.7 mg/dL (ref 0.6–1.0)
Glucose: 123 mg/dL — ABNORMAL HIGH (ref 70–100)
Potassium: 4.3 mEq/L (ref 3.5–5.1)
Sodium: 140 mEq/L (ref 136–145)

## 2016-05-22 LAB — ECG 12-LEAD
Atrial Rate: 98 {beats}/min
P-R Interval: 186 ms
Q-T Interval: 364 ms
QRS Duration: 120 ms
QTC Calculation (Bezet): 464 ms
R Axis: -44 degrees
T Axis: 122 degrees
Ventricular Rate: 98 {beats}/min

## 2016-05-22 LAB — MAGNESIUM: Magnesium: 2.2 mg/dL (ref 1.6–2.6)

## 2016-05-22 LAB — PT/INR
PT INR: 1.2 — ABNORMAL HIGH (ref 0.9–1.1)
PT: 15.3 s — ABNORMAL HIGH (ref 12.6–15.0)

## 2016-05-22 LAB — GFR: EGFR: >60

## 2016-05-22 LAB — HEMOGLOBIN AND HEMATOCRIT, BLOOD
Hematocrit: 26.2 % — ABNORMAL LOW (ref 37.0–47.0)
Hgb: 8.5 g/dL — ABNORMAL LOW (ref 12.0–16.0)

## 2016-05-22 MED ORDER — SODIUM CHLORIDE 0.9 % IV SOLN
INTRAVENOUS | Status: DC | PRN
Start: 2016-05-22 — End: 2016-05-27

## 2016-05-22 MED ORDER — CARVEDILOL 3.125 MG PO TABS
3.1250 mg | ORAL_TABLET | Freq: Two times a day (BID) | ORAL | Status: DC
Start: 2016-05-22 — End: 2016-05-24
  Administered 2016-05-22 – 2016-05-23 (×4): 3.125 mg via ORAL
  Filled 2016-05-22 (×5): qty 1

## 2016-05-22 NOTE — Plan of Care (Signed)
Called for drop in Hb to 6.8. In the setting of recent surgery, CHF, will transfuse 1 U PRBC slowly.    Gala Murdoch, MD

## 2016-05-22 NOTE — Progress Notes (Addendum)
[facility]   PROGRESS NOTE     [facility]       Date Time: 05/22/16 12:42 PM Patient Name: [name], [name]  Medical Record #:  [id number] Account#:  [id number] Admission Date:  05/18/2016      Assessment:    Admitted with LOC shocked out of VT by EMS with recurrent torsades seen on telemetry 05/19/16  ? Prolonged QT interval, tikosyn d/c'd   Recurrent atrial fibrillation s/p extensive bilateral rotor modulation with temporal dispersion, pulmonary vein isolation, and right isthmus ablation February 26, 2016.  ? Recurrences postoperatively, one requiring cardioversion March 06, 2016, with only brief recurrences since.  ? Reloaded on Tikosyn post ablation.   Amiodarone d/c'd with severe DLCO abnormality and moderate obstructive disease   CHA2DS2-VASc 3, continues on Coumadin, admitted with supra-therapeutic INR on admission   TEE March 06, 2016, showing a mildly depressed left ventricular function 45-50%, likely tachy-mediated. Then more depressed on echo 10/17 35%, cath at the time showed no sig CAD, now 25% on echo 05/19/16   Left femur fracture   Depressed TSH, normal free T4, slightly low T3    Recommendations    Further decline in EF now on echo 10/28 EF 25% and that combined with recent cardiac arrest (on tikosyn) started on coreg and recommended a possible lifevest vs ICD implant Monday but will discuss further with EP team.    Brief PAT occ PVC/couplets, no further VT.     QTc is stable at   INR subtherapeutic, restart coumadin once cleared from surgical standpoint, continue off tikosyn    My a/p as above. Mentally clear. Difficult situation re recent pvt arrest (on tikosyn / lqt) but also worsening ef now 25%. With either alone, prob no indication for ICD yet (ie likely not <35% EF for >3 mos and lqt is "reversible"), but putting the 2 together I told her it might be reasonable to consider ICD. We will post for Monday and further discuss / reassess over  wkend. If no ICD, then alternate plan would be: home with Lifevest and reassess EF in a couple weeks by echo (ie to see severely depressed EF was in part related to the recent arrest). Ok to resume coumadin from our standpoint, with goal INR <2.5 (ie if ICD Monday).  Thanks, Particia Lather, MD  [facility]  [phone]; [phone] FFX Spectralink.        Patient Active Problem List   Diagnosis   . Celiac disease   . Scoliosis   . Atrial fibrillation   . Mitral valve disorder   . Disease of tricuspid valve   . Chronic airway obstruction, not elsewhere classified   . AF (atrial fibrillation)   . Paroxysmal a-fib   . CHF (congestive heart failure)   . Ischemic embolic stroke   . Decreased diffusion capacity   . PFO (patent foramen ovale)   . Acquired hypothyroidism   . Chronic systolic heart failure   . PAH (pulmonary artery hypertension)   . Non-rheumatic mitral regurgitation   . Respiratory failure   . Ischemic stroke   . Altered mental status, unspecified altered mental status type         Medications:      Scheduled Meds:        calcium citrate-Vitamin D 2 tablet Oral Daily   carvedilol 3.125 mg Oral Q12H SCH   chlorhexidine 15 mL Mouth/Throat Q12H   famotidine 20 mg Oral Daily   Or  famotidine 20 mg Intravenous Daily   fluticasone-salmeterol 2 puff Inhalation BID   gabapentin 100 mg Oral TID   levothyroxine 112 mcg Oral Daily at 0600   multivitamin-minerals-folic acid-coenzyme q10 1 tablet per OG tube Daily   senna-docusate 2 tablet Oral BID   simvastatin 10 mg Oral QHS       Continuous Infusions:  . sodium chloride 75 mL/hr at 05/22/16 0411        Subjective:   Left hip pain, denies palpitations or syncope    Physical Exam:     VITAL SIGNS PHYSICAL EXAM   Temp:  [97.5 F (36.4 C)-98.8 F (37.1 C)] 98.3 F (36.8 C)  Heart Rate:  [90-112] 92  Resp Rate:  [17-27] 25  BP: (104-138)/(59-94) 112/82  Temp (24hrs), Avg:98.1 F (36.7 C), Min:97.5 F (36.4 C), Max:98.8 F (37.1 C)      Telemetry: Sinus rhythm,frequent  PAC's and PVC's      Intake/Output Summary (Last 24 hours) at 05/22/16 1242  Last data filed at 05/21/16 1800   Gross per 24 hour   Intake              200 ml   Output                0 ml   Net              200 ml    General: awake, alert, breathing comfortable, uncomfortable position in bed  HEENT: normocephalic, PER, anicteric, no cyanosis.  Mucosa moist but pale.    Cardiovascular: regular rate and rhythm, S1, S2, no S3, no S4  Neck: no JVD  Lungs: clear to auscultation   Abdomen: soft,bowel sound present  Extremities: no edema, L hip dressing  Pulse: equal pulses,  symmetric  Neurological: Alert and oriented Musculoskeletal: L hip dressing c/d/i, LE strength or movements not tested  Skin:  Warm and dry       Labs:             Recent Labs  Lab 05/22/16  0413   Magnesium 2.2       Recent Labs  Lab 05/22/16  0413  05/19/16  1800   PT 15.3* More results in Results Review 39.5*   PT INR 1.2* More results in Results Review 4.2*   PTT  --   --  84*   More results in Results Review = values in this interval not displayed.    Recent Labs  Lab 05/22/16  1125 05/22/16  0413 05/21/16  0254 05/20/16  0416   WBC  --  7.87 5.60 5.55   Hgb 8.5* 6.8* 7.0* 9.3*   Hematocrit 26.2* 21.4* 22.0* 28.4*   Platelets  --  144 111* 113*       Recent Labs  Lab 05/22/16  0413 05/21/16  0254 05/20/16  0416   Sodium 140 141 140   Potassium 4.3 3.9 4.4   Chloride 110 109 112*   CO2 22 23 20*   BUN 28.0* 19.0 15.0   Creatinine 0.7 0.7 0.7   EGFR >60.0 >60.0 >60.0   Glucose 123* 146* 114*   Calcium 7.9 7.9 7.6*       Signed by     Edythe Lynn, PA-C    [facility]  [facility]    Arrhythmia Spectralink [phone] (8am-4:30pm)  After hours, non urgent consult line [phone]  After Hours, urgent consults [phone]

## 2016-05-22 NOTE — Progress Notes (Signed)
Orthopedic Daily Progress Note    05/22/2016   5:47 AM    [name] is a 82 y.o. female  2 Days Post-Op  S/p Procedure(s) (LRB):  CLOSED REDUCTION, INSERTION, HIP IM NAIL (GAMMA) (Left) L IT fx    Subjective: NAEON, patient resting comfortably in bed. Scheduled to receive 1uPRBC due to low H/H.  Patient Denies nausea, vomiting, or fevers.     Physical Exam:  Vitals:    05/22/16 0400   BP: (!) 119/94   Pulse: (!) 104   Resp: (!) 27   Temp: 98.8 F (37.1 C)   SpO2: 96%        Intake/Output Summary (Last 24 hours) at 05/22/16 0547  Last data filed at 05/21/16 1800   Gross per 24 hour   Intake              550 ml   Output              225 ml   Net              325 ml       Left Lower Extremity:   Dressings intact, mild SS strikethrough noted                                                                                                                   Wiggles toes well                                                                                                           SILT SP/DP/T                                                                                                                     2+ DP/PT  Cap refill <2 secs      Results     Procedure Component Value Units Date/Time    Prepare RBC: one unit RBC [[id number]] Collected:  05/19/16 0630     Updated:  05/22/16 0525    Narrative:       ?  One unit->1  ?Special Requirements->No special requirements  ?Transfusion Criteria->Hgb <7g/dL or Hct <16% in symptomatic,  ?hemodynamically stable patient  ?Is the patient pregnant?->No  ?Has the patient been transfused w/i the last 3  ?months?->Unknown    Prothrombin time/INR [[id number]]  (Abnormal) Collected:  05/22/16 0413    Specimen:  Blood Updated:  05/22/16 0512     PT 15.3 (H) sec      PT INR 1.2 (H)     PT Anticoag. Given Within 48 hrs. warfarin ([place]    Basic Metabolic Panel [[id number]]  (Abnormal) Collected:  05/22/16 0413    Specimen:  Blood Updated:  05/22/16 0453     Glucose 123 (H) mg/dL      BUN  29.5 (H) mg/dL      Creatinine 0.7 mg/dL      Calcium 7.9 mg/dL      Sodium 621 mEq/L      Potassium 4.3 mEq/L      Chloride 110 mEq/L      CO2 22 mEq/L     Magnesium [[id number]] Collected:  05/22/16 0413    Specimen:  Blood Updated:  05/22/16 0453     Magnesium 2.2 mg/dL     GFR [[id number]] Collected:  05/22/16 0413     Updated:  05/22/16 0453     EGFR >60.0    CBC [[id number]]  (Abnormal) Collected:  05/22/16 0413    Specimen:  Blood from Blood Updated:  05/22/16 0447     WBC 7.87 x10 3/uL      Hgb 6.8 (L) g/dL      Hematocrit 25.3 (L) %      Platelets 144 x10 3/uL      RBC 2.12 (L) x10 6/uL      MCV 100.9 (H) fL      MCH 32.1 (H) pg      MCHC 31.8 (L) g/dL      RDW 14 %      MPV 11.0 fL      Nucleated RBC 0.3 /100 WBC      Absolute NRBC 0.02 (H) x10 3/uL           Assessment: s/p Procedure(s) (LRB):  CLOSED REDUCTION, INSERTION, HIP IM NAIL (GAMMA) (Left)L IT fx    Plan:   Mobility: Out of bed as tolerated with PT/OT   Pain control: Continue to wean/titrate to appropriate oral regimen   DVT Prophylaxis (per Ortho Trauma service Protocol): Lovenox for 6 weeks postop (fractures from Pelvis to subtrochanteric femur)   Foley catheter status: Per protocol   Further surgical plans: No further Orthopedic plans      RUE: WBAT   LUE:  WBAT   RLE:  WBAT   LLE:  WBAT   Disposition: Skilled Nursing Facility when medically stable       [employer]  Dept of Orthopedics  Page [phone] or call [phone] for questions

## 2016-05-22 NOTE — Op Note (Signed)
Procedure Date: 05/20/2016     Patient Type: I     SURGEON: Timoteo Ace MD  ASSISTANT:  Stephania Fragmin DPM     PREOPERATIVE DIAGNOSIS:  Left pertrochanteric femur fracture.     POSTOPERATIVE DIAGNOSIS:  Left pertrochanteric femur fracture.     TITLE OF PROCEDURE:  Cephalomedullary fixation, left femur.     ANESTHESIA:  General.     ESTIMATED BLOOD LOSS:  200 mL.     DRAINS:  None.     SPECIMENS:  None.     COMPLICATIONS:  None.     INDICATIONS FOR PROCEDURE:  The patient is an 82 year old female who sustained a fall.  She was brought  to [facility], where she was found to have a pertrochanteric  fracture of her left proximal femur.  She was admitted and cleared by the  medical service for operative intervention.  I had a long discussion with  the patient and her family and recommended treatment to include  cephalomedullary fixation of her femur.  The risks, benefits and  alternatives were discussed with the patient at length, and she elected to  proceed.     DESCRIPTION OF PROCEDURE:  The patient was correctly identified in the preoperative holding area.  The  left lower extremity was marked.  She was brought to the operating room,  where she surrendered to general anesthesia.  She was placed on the table  in the supine position, with care taken to pad all bony prominences.  We  then prepped and draped the left lower extremity in the usual sterile  Fashion.   A timeout was conducted verifying the operative site, the administration of perioperative antibiotics, anesthesia concerns, and availability of all necessary equipment.     Traction, abduction and internal rotation were used to restore length and  plan rotation to the patient's proximal femur fracture.  We then made a  stab incision proximal to the greater trochanter, and a guidewire for a  Stryker Gamma nail was inserted with the appropriate start point and  trajectory for the gamma nail.  It was advanced down to the level of  the  metaphysis, and the opening reamer was used to open the proximal femoral  canal.  We then passed a ball-tipped guidewire down to a center-center  position at the distal aspect of the femur.  We measured for a size 400 mm  nail.  We then proceeded to ream the femoral canal up to a size 14.5  reamer.  A size 13 x 400 mm gamma nail was implanted.  AP and lateral views  demonstrated satisfactory reduction and alignment.  We then placed a lag  screw through the Stryker jig.  The guidewire was placed in a center-center  position of the femoral head and then overdrilled.  We then placed a 10.5 x  100-mm lag screw into the femoral head, and AP and lateral views  demonstrated satisfactory reduction and screw placement.  Finally, 2 distal  interlocking screws were placed utilizing a perfect circle freehand  technique.  Final AP and lateral views demonstrated satisfactory reduction  and alignment.  We irrigated all wounds thoroughly with normal saline.  The  deep tissues were closed with 0 Vicryl suture, subcutaneous tissues were  closed with 2-0 Monocryl, and the skin was closed with staples.  Sterile  dressings were placed, and the patient tolerated procedure well.  There  were no operative complications.     POSTOPERATIVE PLAN:  The patient  will be weightbearing as tolerated on the left lower extremity.   She will receive appropriate perioperative antibiotics and DVT  prophylaxis.           D:  05/22/2016 13:14 PM by Dr. Timoteo Ace, MD ([id number])  T:  05/22/2016 19:37 PM by       Everlean Cherry: [id number]) (Doc ID: [id number])

## 2016-05-22 NOTE — Plan of Care (Addendum)
Problem: Safety  Goal: Patient will be free from injury during hospitalization  Outcome: Progressing    Care Plan Notes    [facility] Nursing Progress Note    [name] is a 82 y.o. female  Admitted 05/18/2016 10:42 PM [facility] day 3)    Indication for continued [facility] status:  Close monitoring of INR    Major Shift Events:  H&H 6.8 21.4, MD notified. One unit of PRBC to be transfused.    Review of Systems  Neuro:  Alert and Oriented x 4, MAE w/limited movement in LLE, & able to follow commands. Tylenol x 1 for leg pain.    Cardiac:  BP (!) 119/94   Pulse (!) 104   Temp 98.8 F (37.1 C) (Axillary)   Resp (!) 27   Ht 1.626 m (5' 4.02")   Wt 66.5 kg (146 lb 9.7 oz)   SpO2 96%   BMI 25.15 kg/m     Respiratory:  On room air no desaturation.    GI/GU:  Regular (Gluten free diet). OOB x 1 to bedside commode to void.    BM this shift? No    Skin Assessment  Intact. Three clean dressings on LLE.      LDAs  Patient Lines/Drains/Airways Status    Active Lines, Drains and Airways     Name:   Placement date:   Placement time:   Site:   Days:    Peripheral IV 05/21/16 Right Forearm  05/21/16    0312    Forearm    1                Indication for Central Access and estimated target removal date?  None      Indication for Foley and estimated target removal date?  None    Psycho/Social:  Cooperative and calm.

## 2016-05-22 NOTE — Plan of Care (Signed)
[name]    [name] is a 82 y.o. female  Admitted 05/18/2016 10:42 PM [facility] day 3)    Indication for continued [facility] status:  Downgrade    Major Shift Events:  Received 1 unit PRBC this AM, repeat H&H 8.5. Ambulated with walker and x1 standby assist to bathroom.     Review of Systems  Neuro:  Pt is A&Ox4, FC, MAE, complains of 3/10 pain when ambulating. Tylenol x1 PRN.     Cardiac:  Sinus arrhythmia with frequent PVCs, HR 80-90, SBP 130s, afebrile, pulses palpable but cold in all extremities.     Respiratory:  Oxygen saturation maintained on RA, crackles heard around 1200, IV fluid stopped, chest xray ordered.     GI/GU:  Regular diet, gluten free. Active BS with no BM this shift. AUOP, yellow, clear.     BM this shift? No    Skin Assessment  Skin grossly intact, surgical incisions on L leg, dressing clean and intact, surgery plan to change tomorrow.       LDAs  Patient Lines/Drains/Airways Status    Active Lines, Drains and Airways     Name:   Placement date:   Placement time:   Site:   Days:    Peripheral IV 05/21/16 Right Forearm  05/21/16    0312    Forearm    1                    Indication for Central Access and estimated target removal date?  NA      Indication for Foley and estimated target removal date?  NA      Psycho/Social:  Pt pleasant and cooperative, able to participate in care. Husband at bedside this shift and appropriately supportive.

## 2016-05-22 NOTE — Progress Notes (Signed)
MEDICINE PROGRESS NOTE    Date Time: 05/22/16 2:37 PM  Patient Name: [name],[name]  Attending Physician: Janalee Dane, MD    Assessment:   Active Problems:    Atrial fibrillation    CHF (congestive heart failure)    Ischemic embolic stroke    Decreased diffusion capacity    PFO (patent foramen ovale)    Acquired hypothyroidism    Chronic systolic heart failure    PAH (pulmonary artery hypertension)    Non-rheumatic mitral regurgitation    Respiratory failure    Ischemic stroke    Altered mental status, unspecified altered mental status type    Closed pertrochanteric fracture of femur, left, initial encounter  Resolved Problems:    * No resolved hospital problems. *    82 y.o. female with left displaced reverse obliquity intertrochanteric femur fracture and VT    Plan:     1) Left displaced intertrochenteric fracture - s/p closed reduction - PT/OT working with pt and recommended subacute rehab - pain well controlled   lovenox for 6 weeks   swellingof the leg noted today could be post op but will do [place] leg    2) VT and torsades ; ep on board  amio dcd  Sec to lung disease  QTc stable now   ICD to be placed on Monday    3) Recurrent afib : On lovenox - coumadin on hold for now after surgery- resume whrn appropiate      4)Hypothyroidism : ON replacement     5) Disp : Placement   Case discussed with: RN and pt     Safety Checklist:     DVT prophylaxis:  CHEST guideline (See page [id number]) Chemical   Foley:  [facility] Foley protocol Not present   IVs:  Peripheral IV   PT/OT: Ordered   Daily CBC & or Chem ordered:  SHM/ABIM guidelines (see #5) Yes, due to clinical and lab instability   Reference for approximate charges of common labs: CBC auto diff - $76  BMP - $99  Mg - $79    Lines:     Patient Lines/Drains/Airways Status    Active PICC Line / CVC Line / PIV Line / Drain / Airway / Intraosseous Line / Epidural Line / ART Line / Line / Wound / Pressure Ulcer / NG/OG Tube     Name:   Placement date:   Placement time:    Site:   Days:    Peripheral IV 05/21/16 Right Forearm  05/21/16    0312    Forearm    less than 1    Incision Site 05/20/16 Hip Left  05/20/16    1643      1                 Disposition: (Please see PAF column for Expected D/C Date)   Today's date: 05/22/2016  Admit Date: 05/18/2016 10:42 PM  LOS: 3  Clinical Milestones: On lovenox- needs placement   Anticipated discharge needs:placement       Subjective     CC: <principal problem not specified>    Interval History/24 hour events:no fever chills abd pain nausea vomiting or diarhea - pain well controlled- has swelling of the left leg noted     HPI/Subjective: came in with shock for VT    Review of Systems:   As per HPI     Physical Exam:     VITAL SIGNS PHYSICAL EXAM   Temp:  [97.5 F (  36.4 C)-98.8 F (37.1 C)] 98.3 F (36.8 C)  Heart Rate:  [90-112] 92  Resp Rate:  [17-27] 25  BP: (104-138)/(59-94) 112/82  Blood Glucose:        Intake/Output Summary (Last 24 hours) at 05/22/16 1437  Last data filed at 05/21/16 1800   Gross per 24 hour   Intake              200 ml   Output                0 ml   Net              200 ml    Physical Exam  General: awake, alert X o3  Cardiovascular: regular rate and rhythm, no murmurs, rubs or gallops  Lungs: clear to auscultation bilaterally, without wheezing, rhonchi, or rales  Abdomen: soft, non-tender, non-distended; no palpable masses,  normoactive bowel sounds  Extremities: left leg swelling and limited motion         Meds:     Medications were reviewed:    Labs:     Labs (last 72 hours):      Recent Labs  Lab 05/22/16  1125 05/22/16  0413 05/21/16  0254   WBC  --  7.87 5.60   Hgb 8.5* 6.8* 7.0*   Hematocrit 26.2* 21.4* 22.0*   Platelets  --  144 111*         Recent Labs  Lab 05/22/16  0413 05/21/16  0254  05/19/16  1800   PT 15.3* 15.2* More results in Results Review 39.5*   PT INR 1.2* 1.2* More results in Results Review 4.2*   PTT  --   --   --  84*   More results in Results Review = values in this interval not displayed.    Recent Labs  Lab 05/22/16  0413 05/21/16  0254  05/19/16  0413 05/18/16  2307   Sodium 140 141 More results in Results Review 141 139   Potassium 4.3 3.9 More results in Results Review 3.7 4.0   Chloride 110 109 More results in Results Review 113* 106   CO2 22 23 More results in Results Review 20* 21*   BUN 28.0* 19.0 More results in Results Review 22.0* 28.0*   Creatinine 0.7 0.7 More results in Results Review 0.8 1.2*   Calcium 7.9 7.9 More results in Results Review 7.2* 9.2   Albumin  --   --   --  3.0* 3.8   Protein, Total  --   --   --  4.7* 6.2   Bilirubin, Total  --   --   --  0.7 0.8   Alkaline Phosphatase  --   --   --  76 86   ALT  --   --   --  98* 96*   AST (SGOT)  --   --   --  163* 153*   Glucose 123* 146* More results in Results Review 134* 193*   More results in Results Review = values in this interval not displayed.                    Signed by: Janalee Dane, MD

## 2016-05-22 NOTE — Progress Notes (Signed)
NEUROLOGY PROGRESS NOTE    Date Time: 05/22/16 8:21 AM  Patient Name: TeresaTeresa [name]  Attending Physician: Shawnee Knapp, MD    BRIEF HISTORY:   REASON FOR CONSULTATION:  Unwitnessed fall, altered mental status, rule out stroke.     HISTORY OF PRESENT ILLNESS:  The patient is an 82 year old woman with history of prior stroke, mitral  regurgitation, atrial fibrillation on Coumadin, congestive heart failure  who comes into the hospital with unwitnessed fall, left-sided weakness,  concerned about stroke.  The patient is currently intubated but alert.   History is mostly from medical records.  The patient was watching TV with  her husband last night when she went to another room; 30 to 40 minutes  later husband went to check on her and found her lying on the floor.  She  was unconscious and would not answer.  EMS was called.  The patient was  noted to have wide complex tachycardia en route.  This was suspected as  ventricular tachycardia.  She was cardioverted x2.  The patient in the ED  had NIH stroke scale of 21.  CT brain scan negative for any bleed, no TPA  was administered due to her INR of 3.7.  The patient currently is more  awake, responsive, denies pain.  She is intubated in the ICU.         Chief Complaints:   Alert, dry mouth better  Ate breakfast  Left leg pain  Post surgery  Left hip fracture      Interval History/24 hour events:   In [facility]  Denies headache or confusion  Likely syncope caused fall with left hip fracture  MRI cancelled as low probability of stroke  Overall much better  No headache or confusion       Review Of Systems:  No headaches, dizziness, confusion, focal weakness or numbness  No deafness, tinnitus, blurred vision, or vision loss, No double vision or droopy lids  No speech or swallowing problems  Balance poor,+ Falls  No bowel or bladder incontinence  No fever or chills        Physical Exam:     Vitals:    05/22/16 0700   BP: 123/65   Pulse: 90   Resp: 18   Temp: 98.6 F (37 C)    SpO2: 96%       Intake and Output Summary (Last 24 hours) at Date Time    Intake/Output Summary (Last 24 hours) at 05/22/16 1191  Last data filed at 05/21/16 1800   Gross per 24 hour   Intake              200 ml   Output              150 ml   Net               50 ml       General: No apparent distress, comfortable appearing, vital signs noted above  Head: Mild bruise right temple and normocephalic  Eyes: Anicteric sclerae, moist conjunctiva, PERRLA   ENT: Oropharynx clear; no erythema or exudates;   Neck: supple; FROM; no mass   Extremities: No clubbing, cyanosis or edema, left hip fracture  CV: Regular rate and rhythm, no murmurs  Lungs: clear  Skin: No rash, lesions, ulcers   Neurological Examination  Mental Status:   Alert, oriented, to time, place, person and situation, confused about surgery   Clear speech, normal language, including fluency, naming,  Normal short term and remote memory.  Adequate knowledge.   Affect appropriate   CN 2-12    EOM full, PERL, Normal fields and fundi, Face symmetric, tongue midline with normal bulk and no fasicualtions  Hearing, facial sensations and Gag reflex intact   Motor system: Normal tone, bulk, no involuntary movements.     Power 4/5.  No cog wheeling, tremors, or bradykinesia.   Sensory system: Normal touch, pin prick vibration in all 4 extremities; no sensory level   Reflexes:  Deep tendon reflexes symmetric, Negative Babinski  Cerebellar:  Mild tremor  Gait: Not tested  Neurovascular: No bruits.  Signs of meningeal irritation: None        Other pertinent positive findings:       Meds:     Current Facility-Administered Medications   Medication Dose Route Frequency   . calcium citrate-Vitamin D  2 tablet Oral Daily   . chlorhexidine  15 mL Mouth/Throat Q12H   . famotidine  20 mg Oral Daily    Or   . famotidine  20 mg Intravenous Daily   . fluticasone-salmeterol  2 puff Inhalation BID   . gabapentin  100 mg Oral TID   . levothyroxine  112 mcg Oral Daily at 0600   .  multivitamin-minerals-folic acid-coenzyme q10  1 tablet per OG tube Daily   . senna-docusate  2 tablet Oral BID   . simvastatin  10 mg Oral QHS       Labs:     Results     Procedure Component Value Units Date/Time    Prepare RBC: one unit RBC [[id number]] Collected:  05/19/16 0630     Updated:  05/22/16 0552     RBC Leukoreduced RBC Leukoreduced     BLUNIT [id number]     Status issued     PRODUCT CODE (NON READABLE) E0336V00     Expiration Date [id number]     UTYPE A POS    Prothrombin time/INR [[id number]]  (Abnormal) Collected:  05/22/16 0413    Specimen:  Blood Updated:  05/22/16 0512     PT 15.3 (H) sec      PT INR 1.2 (H)     PT Anticoag. Given Within 48 hrs. warfarin ([place]    Basic Metabolic Panel [[id number]]  (Abnormal) Collected:  05/22/16 0413    Specimen:  Blood Updated:  05/22/16 0453     Glucose 123 (H) mg/dL      BUN 72.5 (H) mg/dL      Creatinine 0.7 mg/dL      Calcium 7.9 mg/dL      Sodium 366 mEq/L      Potassium 4.3 mEq/L      Chloride 110 mEq/L      CO2 22 mEq/L     Magnesium [[id number]] Collected:  05/22/16 0413    Specimen:  Blood Updated:  05/22/16 0453     Magnesium 2.2 mg/dL     GFR [[id number]] Collected:  05/22/16 0413     Updated:  05/22/16 0453     EGFR >60.0    CBC [[id number]]  (Abnormal) Collected:  05/22/16 0413    Specimen:  Blood from Blood Updated:  05/22/16 0447     WBC 7.87 x10 3/uL      Hgb 6.8 (L) g/dL      Hematocrit 16.6 (L) %      Platelets 144 x10 3/uL      RBC 2.12 (L) x10 6/uL      MCV 100.9 (H) fL  MCH 32.1 (H) pg      MCHC 31.8 (L) g/dL      RDW 14 %      MPV 11.0 fL      Nucleated RBC 0.3 /100 WBC      Absolute NRBC 0.02 (H) x10 3/uL             Imaging personally reviewed, including:     Radiology Results (24 Hour)     ** No results found for the last 24 hours. **            Assessment:     Patient Active Problem List   Diagnosis   . Celiac disease   . Scoliosis   . Atrial fibrillation   . Mitral valve disorder   . Disease of tricuspid valve   . Chronic airway  obstruction, not elsewhere classified   . AF (atrial fibrillation)   . Paroxysmal a-fib   . CHF (congestive heart failure)   . Ischemic embolic stroke   . Decreased diffusion capacity   . PFO (patent foramen ovale)   . Acquired hypothyroidism   . Chronic systolic heart failure   . PAH (pulmonary artery hypertension)   . Non-rheumatic mitral regurgitation   . Respiratory failure   . Ischemic stroke   . Altered mental status, unspecified altered mental status type     Fall  Syncope  Left hip fracture  Coagulopathy      Plan/Recommendations:   General measures:  Diet/activity/Precuations: Per routine from admitting orders  Symptomatic measures: as needed  Diagnostics:Reviewed CT/CTA  Medications:Noted  Consultation:Ortho for hip surgery  Physical/Occupational/Speech Therapy:As needed  Patient/Family Education/Counseling:Provided        Anticipated discharge disposition and date: Unknown     Case discussed with: Nursing/Team        Signed by: Shawnee Knapp, MD

## 2016-05-22 NOTE — Progress Notes (Signed)
CCM spoke with patient after reviewing the chart and patient has finished receiving 1U PRBCs and still requires H/H and INR monitoring.    CCM explained to patient that she is recommended for SNF level of rehab and she indicated that she has never needed this type of rehab but she just finished OP rehab for her R knee. Patient states that she understands she needs rehab because of stamina and working on a stable gait.     CCM provided her with a list of rehab centers within 15 miles of her home and advised her to have her husband tour facilities this weekend and provide CCM with several choices on Monday. CCM also advised the patient that she likely will be d/c'd early the week of 12/4. Patient stated she understood and she will get back to [facility] tomorrow Sat or Monday    Lenis Dickinson RN, MSN  Clinical Case Manager   [facility]   Email: [email or web address]  Spectralink: Monday-Friday [phone] on Weekends call x [phone] Thank You

## 2016-05-23 ENCOUNTER — Inpatient Hospital Stay: Payer: Medicare Other

## 2016-05-23 LAB — CBC AND DIFFERENTIAL
Absolute NRBC: 0.05 10*3/uL — ABNORMAL HIGH
Basophils Absolute Automated: 0 10*3/uL (ref 0.00–0.20)
Basophils Automated: 0 %
Eosinophils Absolute Automated: 0.04 10*3/uL (ref 0.00–0.70)
Eosinophils Automated: 0.8 %
Hematocrit: 23.6 % — ABNORMAL LOW (ref 37.0–47.0)
Hgb: 7.7 g/dL — ABNORMAL LOW (ref 12.0–16.0)
Immature Granulocytes Absolute: 0.02 10*3/uL
Immature Granulocytes: 0.4 %
Lymphocytes Absolute Automated: 1.22 10*3/uL (ref 0.50–4.40)
Lymphocytes Automated: 23.7 %
MCH: 31.4 pg (ref 28.0–32.0)
MCHC: 32.6 g/dL (ref 32.0–36.0)
MCV: 96.3 fL (ref 80.0–100.0)
MPV: 10.6 fL (ref 9.4–12.3)
Monocytes Absolute Automated: 0.45 10*3/uL (ref 0.00–1.20)
Monocytes: 8.7 %
Neutrophils Absolute: 3.42 10*3/uL (ref 1.80–8.10)
Neutrophils: 66.4 %
Nucleated RBC: 1 /100 WBC (ref 0.0–1.0)
Platelets: 122 10*3/uL — ABNORMAL LOW (ref 140–400)
RBC: 2.45 10*6/uL — ABNORMAL LOW (ref 4.20–5.40)
RDW: 16 % — ABNORMAL HIGH (ref 12–15)
WBC: 5.15 10*3/uL (ref 3.50–10.80)

## 2016-05-23 LAB — ECG 12-LEAD
Atrial Rate: 77 {beats}/min
P Axis: 62 degrees
P-R Interval: 182 ms
Q-T Interval: 434 ms
QRS Duration: 118 ms
QTC Calculation (Bezet): 491 ms
R Axis: -49 degrees
T Axis: 210 degrees
Ventricular Rate: 77 {beats}/min

## 2016-05-23 LAB — COMPREHENSIVE METABOLIC PANEL
ALT: 32 U/L (ref 0–55)
AST (SGOT): 41 U/L — ABNORMAL HIGH (ref 5–34)
Albumin/Globulin Ratio: 1.3 (ref 0.9–2.2)
Albumin: 2.9 g/dL — ABNORMAL LOW (ref 3.5–5.0)
Alkaline Phosphatase: 60 U/L (ref 37–106)
BUN: 26 mg/dL — ABNORMAL HIGH (ref 7.0–19.0)
Bilirubin, Total: 1.2 mg/dL (ref 0.2–1.2)
CO2: 22 mEq/L (ref 22–29)
Calcium: 7.7 mg/dL — ABNORMAL LOW (ref 7.9–10.2)
Chloride: 110 mEq/L (ref 100–111)
Creatinine: 0.7 mg/dL (ref 0.6–1.0)
Globulin: 2.3 g/dL (ref 2.0–3.6)
Glucose: 94 mg/dL (ref 70–100)
Potassium: 3.9 mEq/L (ref 3.5–5.1)
Protein, Total: 5.2 g/dL — ABNORMAL LOW (ref 6.0–8.3)
Sodium: 139 mEq/L (ref 136–145)

## 2016-05-23 LAB — PT/INR
PT INR: 1.2 — ABNORMAL HIGH (ref 0.9–1.1)
PT: 15.4 s — ABNORMAL HIGH (ref 12.6–15.0)

## 2016-05-23 LAB — GFR: EGFR: >60

## 2016-05-23 LAB — HEMOGLOBIN AND HEMATOCRIT, BLOOD
Hematocrit: 25.2 % — ABNORMAL LOW (ref 37.0–47.0)
Hgb: 8 g/dL — ABNORMAL LOW (ref 12.0–16.0)

## 2016-05-23 NOTE — Plan of Care (Signed)
Problem: Every Day - Stroke  Goal: Core/Quality measure requirements - Daily  Outcome: Progressing   05/23/16 0104   Goal/Interventions addressed this shift   Core/Quality measure requirements - Daily  VTE Prevention: Ensure anticoagulant(s) administered and/or anti-embolism stockings/devices documented by end of day 2;Once lipid panel has resulted, check LDL. Contact provider for statin order if LDL > 70 (or ensure contraindication documented by LIP).;Continue stroke education (must include Modifiable Risk Factors, Warning Signs and Symptoms of Stroke, Activation of Emergency Medical System and Follow-up Appointments). Ensure handout has been given and documented.     Goal: Mobility/Activity is maintained at optimal level for patient  Outcome: Progressing   05/23/16 0104   Goal/Interventions addressed this shift   Mobility/activity is maintained at optimal level for patient Increase mobility as tolerated/progressive mobility;Maintain proper body alignment;Plan activities to conserve energy, plan rest periods;Reposition patient every 2 hours and as needed unless able to reposition self;Assess for changes in respiratory status, level of consciousness and/or development of fatigue;Consult/collaborate with Physical Therapy and/or Occupational Therapy      05/23/16 0104   Goal/Interventions addressed this shift   Mobility/activity is maintained at optimal level for patient Increase mobility as tolerated/progressive mobility;Maintain proper body alignment;Plan activities to conserve energy, plan rest periods;Reposition patient every 2 hours and as needed unless able to reposition self;Assess for changes in respiratory status, level of consciousness and/or development of fatigue;Consult/collaborate with Physical Therapy and/or Occupational Therapy     Goal: Skin integrity is maintained or improved  Outcome: Progressing   05/23/16 0104   Goal/Interventions addressed this shift   Skin integrity is maintained or improved  Assess Braden Scale every shift;Turn or reposition patient every 2 hours or as needed unless able to reposition self;Increase activity as tolerated/progressive mobility;Collaborate with Wound, Ostomy, and Continence Nurse;Keep head of bed 30 degrees or less (unless contraindicated);Keep skin clean and dry;Encourage use of lotion/moisturizer on skin;Relieve pressure to bony prominences       Problem: Impaired Mobility  Goal: Mobility/Activity is maintained at optimal level for patient  Outcome: Progressing   05/23/16 0104   Goal/Interventions addressed this shift   Mobility/activity is maintained at optimal level for patient Increase mobility as tolerated/progressive mobility;Maintain proper body alignment;Plan activities to conserve energy, plan rest periods;Reposition patient every 2 hours and as needed unless able to reposition self;Assess for changes in respiratory status, level of consciousness and/or development of fatigue;Consult/collaborate with Physical Therapy and/or Occupational Therapy      05/23/16 0104   Goal/Interventions addressed this shift   Mobility/activity is maintained at optimal level for patient Increase mobility as tolerated/progressive mobility;Maintain proper body alignment;Plan activities to conserve energy, plan rest periods;Reposition patient every 2 hours and as needed unless able to reposition self;Assess for changes in respiratory status, level of consciousness and/or development of fatigue;Consult/collaborate with Physical Therapy and/or Occupational Therapy       Problem: Compromised skin integrity  Goal: Skin integrity is maintained or improved  Outcome: Progressing   05/23/16 0104   Goal/Interventions addressed this shift   Skin integrity is maintained or improved Assess Braden Scale every shift;Turn or reposition patient every 2 hours or as needed unless able to reposition self;Increase activity as tolerated/progressive mobility;Collaborate with Wound, Ostomy, and Continence  Nurse;Keep head of bed 30 degrees or less (unless contraindicated);Keep skin clean and dry;Encourage use of lotion/moisturizer on skin;Relieve pressure to bony prominences       Comments: Received patient awake, no report of pain/discomfort.  She reports  of not getting dinner, was transferred to  our unit around dinner time.  After hour tray provided, patient ate 50% of salad.  Fall precaution maintained. Call bell accessible.

## 2016-05-23 NOTE — Progress Notes (Signed)
[facility]   PROGRESS NOTE     [facility]       Date Time: 05/23/16 12:37 PM Patient Name: [name], [name]  Medical Record #:  [id number] Account#:  [id number] Admission Date:  05/18/2016      Assessment:    Admitted with LOC shocked out of VT by EMS with recurrent torsades seen on telemetry 05/19/16.  Prolonged QT interval, tikosyn d/c'd   Recurrent atrial fibrillation s/pextensive bilateral rotor modulation with temporal dispersion, pulmonary vein isolation, and right isthmus ablation February 26, 2016.   Unable to take amiodarone or Tikosyn (pulmonary toxicity and long QT)   CHA2DS2-VASc 3, continues on Coumadin, admitted with supra-therapeutic INR on admission   TEESeptember 15, 2017, showing a mildly depressed left ventricular function 45-50%, likely tachy-mediated. Then more depressed on echo 10/17 35%, cath at the time showed no sig CAD, now 25% on echo 05/19/16   Left femur fracture, s/p surgery   Depressed TSH, normal free T4, slightly low T3    Recommendations    No anti arrhythmic drug for isolated (although relatively frequent) PVC   Plan ICD implant on Monday, she is agreeable.      Patient Active Problem List   Diagnosis   . Celiac disease   . Scoliosis   . Atrial fibrillation   . Mitral valve disorder   . Disease of tricuspid valve   . Chronic airway obstruction, not elsewhere classified   . AF (atrial fibrillation)   . Paroxysmal a-fib   . CHF (congestive heart failure)   . Ischemic embolic stroke   . Decreased diffusion capacity   . PFO (patent foramen ovale)   . Acquired hypothyroidism   . Chronic systolic heart failure   . PAH (pulmonary artery hypertension)   . Non-rheumatic mitral regurgitation   . Respiratory failure   . Ischemic stroke   . Altered mental status, unspecified altered mental status type   . Closed pertrochanteric fracture of femur, left, initial encounter         Medications:      Scheduled Meds:      calcium citrate-Vitamin D 2 tablet Oral  Daily   carvedilol 3.125 mg Oral Q12H SCH   famotidine 20 mg Oral Daily   Or      famotidine 20 mg Intravenous Daily   fluticasone-salmeterol 2 puff Inhalation BID   gabapentin 100 mg Oral TID   levothyroxine 112 mcg Oral Daily at 0600   multivitamin-minerals-folic acid-coenzyme q10 1 tablet per OG tube Daily   senna-docusate 2 tablet Oral BID   simvastatin 10 mg Oral QHS           Subjective:   No palpitations    Physical Exam:     VITAL SIGNS PHYSICAL EXAM   Temp:  [96 F (35.6 C)-97.7 F (36.5 C)] 97 F (36.1 C)  Heart Rate:  [52-93] 77  Resp Rate:  [16-22] 18  BP: (107-128)/(54-72) 118/63  Temp (24hrs), Avg:96.7 F (35.9 C), Min:96 F (35.6 C), Max:97.7 F (36.5 C)      Telemetry: Sinus rhythm, frequent PVCs     General: awake, alert, breathing comfortable, no acute distress  HEENT: normocephalic, PER, anicteric, no cyanosis.  Mucosa moist and pale.  No proptosis or lid lag.  Cardiovascular: regular rate and rhythm, S1, S2, no S3, no S4 occasional ectopies. A systolic murmur is present  Neck: no JVD  Lungs: clear to auscultation   Abdomen: bowel sound present  Extremities: left hip post  surgery, edema  Pulse: equal radial pulses  Neurological: Alert and oriented Musculoskeletal:  normal tone  Skin:  Warm and dry       Labs:             Recent Labs  Lab 05/22/16  0413   Magnesium 2.2       Recent Labs  Lab 05/23/16  0422  05/19/16  1800   PT 15.4* More results in Results Review 39.5*   PT INR 1.2* More results in Results Review 4.2*   PTT  --   --  84*   More results in Results Review = values in this interval not displayed.    Recent Labs  Lab 05/23/16  0422 05/22/16  1125 05/22/16  0413 05/21/16  0254   WBC 5.15  --  7.87 5.60   Hgb 7.7* 8.5* 6.8* 7.0*   Hematocrit 23.6* 26.2* 21.4* 22.0*   Platelets 122*  --  144 111*       Recent Labs  Lab 05/23/16  0423 05/22/16  0413 05/21/16  0254   Sodium 139 140 141   Potassium 3.9 4.3 3.9   Chloride 110 110 109   CO2 22 22 23    BUN 26.0* 28.0* 19.0   Creatinine  0.7 0.7 0.7   EGFR >60.0 >60.0 >60.0   Glucose 94 123* 146*   Calcium 7.7* 7.9 7.9       Recent Labs  Lab 05/19/16  0413   Thyroid Stimulating Hormone 0.09*     Estimated Creatinine Clearance: 58.1 mL/min (based on SCr of 0.7 mg/dL).      Signed by     Lonzo Cloud, MD    [facility]  [facility]    Arrhythmia Spectralink [phone] (8am-4:30pm)  After hours, non urgent consult line [phone]  After Hours, urgent consults [phone]  For EP questions or consults during the week, easiest to call Spectralink [phone]   You can also reach [place] 24/7 through our main EP office [phone].

## 2016-05-23 NOTE — Plan of Care (Signed)
Problem: Safety  Goal: Patient will be free from injury during hospitalization  Outcome: Progressing   05/23/16 1551   Goal/Interventions addressed this shift   Patient will be free from injury during hospitalization  Assess patient's risk for falls and implement fall prevention plan of care per policy;Use appropriate transfer methods;Provide and maintain safe environment;Ensure appropriate safety devices are available at the bedside;Include patient/ family/ care giver in decisions related to safety;Hourly rounding       Problem: Pain  Goal: Pain at adequate level as identified by patient  Outcome: Progressing   05/23/16 1551   Goal/Interventions addressed this shift   Pain at adequate level as identified by patient Identify patient comfort function goal;Reassess pain within 30-60 minutes of any procedure/intervention, per Pain Assessment, Intervention, Reassessment (AIR) Cycle;Evaluate if patient comfort function goal is met;Evaluate patient's satisfaction with pain management progress;Include patient/patient care companion in decisions related to pain management as needed       Problem: Discharge Barriers  Goal: Patient will be discharged home or other facility with appropriate resources  Outcome: Progressing      Problem: Psychosocial and Spiritual Needs  Goal: Demonstrates ability to cope with hospitalization/illness  Outcome: Progressing   05/23/16 1551   Goal/Interventions addressed this shift   Demonstrates ability to cope with hospitalizations/illness Encourage verbalization of feelings/concerns/expectations;Provide quiet environment;Include patient/ patient care companion in decisions       Problem: Every Day - Stroke  Goal: Neurological status is stable or improving  Outcome: Progressing   05/23/16 1551   Goal/Interventions addressed this shift   Neurological status is stable or improving Monitor/assess/document neurological assessment (Stroke: every 4 hours);Perform CAM Assessment       Problem:  Peripheral Neurovascular Impairment  Goal: Extremity color, movement, sensation are maintained or improved  Outcome: Progressing   05/23/16 1551   Goal/Interventions addressed this shift   Extremity color, movement, sensation are maintained or improved  Increase mobility as tolerated/progressive mobility;Assess and monitor application of corrective devices (cast, brace, splint), check skin integrity;Assess extremity for proper alignment;VTE Prevention: Administer anticoagulant(s) and/or apply anti-embolism stockings/devices as ordered       Problem: Neurological Deficit  Goal: Neurological status is stable or improving  Outcome: Progressing   05/23/16 1551   Goal/Interventions addressed this shift   Neurological status is stable or improving Monitor/assess/document neurological assessment (Stroke: every 4 hours);Perform CAM Assessment       Comments: A&O x4, FC, clear speech, MAE with limited movement of LLE. C/o pain relieved by PRN tylenol. A-fib on tele, rate controlled. US venous doppler of LLE negative for blood clot. Current H&H 8.0 & 25.2 Tolerating gluten restricted diet, takes pills whole. Continent of urine, BM today. OOB to the [facility] x 2 person assist. Husband & son at the bedside. Fall precautions reinforced, call bell within reach. Will continue to monitor.

## 2016-05-23 NOTE — Progress Notes (Signed)
MEDICINE PROGRESS NOTE    Date Time: 05/23/16 2:17 PM  Patient Name: [name],[name]  Attending Physician: Janalee Dane, MD    Assessment:   Active Problems:    Atrial fibrillation    CHF (congestive heart failure)    Ischemic embolic stroke    Decreased diffusion capacity    PFO (patent foramen ovale)    Acquired hypothyroidism    Chronic systolic heart failure    PAH (pulmonary artery hypertension)    Non-rheumatic mitral regurgitation    Respiratory failure    Ischemic stroke    Altered mental status, unspecified altered mental status type    Closed pertrochanteric fracture of femur, left, initial encounter  Resolved Problems:    * No resolved hospital problems. *    82 y.o. female with left displaced reverse obliquity intertrochanteric femur fracture and VT    Plan:     1) Left displaced intertrochenteric fracture - s/p closed reduction - PT/OT working with pt and recommended subacute rehab - pain well controlled   lovenox for 6 weeks   swellingof the leg most likely post op as [place] negative    2) VT and torsades ; ep on board  amio dcd  Sec to lung disease  QTc stable now   ICD to be placed on Monday    3) Recurrent afib : On lovenox - coumadin on hold for now after surgery- resume whrn appropiate      4)Hypothyroidism : ON replacement     5) Anemia : S/p Blood transfusion- continue to monitor- Hb 8 today    6) Disp : Placement   Case discussed with: RN and pt     Safety Checklist:     DVT prophylaxis:  CHEST guideline (See page [id number]) Chemical   Foley:  [place] [facility] Foley protocol Not present   IVs:  Peripheral IV   PT/OT: Ordered   Daily CBC & or Chem ordered:  SHM/ABIM guidelines (see #5) Yes, due to clinical and lab instability   Reference for approximate charges of common labs: CBC auto diff - $76  BMP - $99  Mg - $79    Lines:     Patient Lines/Drains/Airways Status    Active PICC Line / CVC Line / PIV Line / Drain / Airway / Intraosseous Line / Epidural Line / ART Line / Line / Wound / Pressure Ulcer /  NG/OG Tube     Name:   Placement date:   Placement time:   Site:   Days:    Peripheral IV 05/21/16 Right Forearm  05/21/16    0312    Forearm    less than 1    Incision Site 05/20/16 Hip Left  05/20/16    1643      1                 Disposition: (Please see PAF column for Expected D/C Date)   Today's date: 05/23/2016  Admit Date: 05/18/2016 10:42 PM  LOS: 4  Clinical Milestones: On lovenox- needs placement   Anticipated discharge needs:placement       Subjective     CC: <principal problem not specified>    Interval History/24 hour events:no fever chills abd pain nausea vomiting or diarhea - pain well controlled    HPI/Subjective: came in with shock for VT    Review of Systems:   As per HPI     Physical Exam:     VITAL SIGNS PHYSICAL EXAM   Temp:  [  96 F (35.6 C)-97.7 F (36.5 C)] 97 F (36.1 C)  Heart Rate:  [52-93] 77  Resp Rate:  [16-22] 18  BP: (107-128)/(54-72) 118/63  Blood Glucose:        Intake/Output Summary (Last 24 hours) at 05/23/16 1417  Last data filed at 05/22/16 2148   Gross per 24 hour   Intake              300 ml   Output                0 ml   Net              300 ml    Physical Exam  General: awake, alert X o3  Cardiovascular: regular rate and rhythm, no murmurs, rubs or gallops  Lungs: clear to auscultation bilaterally, without wheezing, rhonchi, or rales  Abdomen: soft, non-tender, non-distended; no palpable masses,  normoactive bowel sounds  Extremities: left leg swelling and limited motion         Meds:     Medications were reviewed:    Labs:     Labs (last 72 hours):      Recent Labs  Lab 05/23/16  1318 05/23/16  0422  05/22/16  0413   WBC  --  5.15  --  7.87   Hgb 8.0* 7.7* More results in Results Review 6.8*   Hematocrit 25.2* 23.6* More results in Results Review 21.4*   Platelets  --  122*  --  144   More results in Results Review = values in this interval not displayed.      Recent Labs  Lab 05/23/16  0422 05/22/16  0413  05/19/16  1800   PT 15.4* 15.3* More results in Results Review  39.5*   PT INR 1.2* 1.2* More results in Results Review 4.2*   PTT  --   --   --  84*   More results in Results Review = values in this interval not displayed.   Recent Labs  Lab 05/23/16  0423 05/22/16  0413  05/19/16  0413   Sodium 139 140 More results in Results Review 141   Potassium 3.9 4.3 More results in Results Review 3.7   Chloride 110 110 More results in Results Review 113*   CO2 22 22 More results in Results Review 20*   BUN 26.0* 28.0* More results in Results Review 22.0*   Creatinine 0.7 0.7 More results in Results Review 0.8   Calcium 7.7* 7.9 More results in Results Review 7.2*   Albumin 2.9*  --   --  3.0*   Protein, Total 5.2*  --   --  4.7*   Bilirubin, Total 1.2  --   --  0.7   Alkaline Phosphatase 60  --   --  76   ALT 32  --   --  98*   AST (SGOT) 41*  --   --  163*   Glucose 94 123* More results in Results Review 134*   More results in Results Review = values in this interval not displayed.                    Signed by: Janalee Dane, MD

## 2016-05-23 NOTE — Progress Notes (Signed)
NEUROLOGY PROGRESS NOTE    Date Time: 05/23/16 10:48 AM  Patient Name: [name]  Attending Physician: Shawnee Knapp, MD    BRIEF HISTORY:   REASON FOR CONSULTATION:  Unwitnessed fall, altered mental status, rule out stroke.     HISTORY OF PRESENT ILLNESS:  The patient is an 82 year old woman with history of prior stroke, mitral  regurgitation, atrial fibrillation on Coumadin, congestive heart failure  who comes into the hospital with unwitnessed fall, left-sided weakness,  concerned about stroke.  The patient is currently intubated but alert.   History is mostly from medical records.  The patient was watching TV with  her husband last night when she went to another room; 30 to 40 minutes  later husband went to check on her and found her lying on the floor.  She  was unconscious and would not answer.  EMS was called.  The patient was  noted to have wide complex tachycardia en route.  This was suspected as  ventricular tachycardia.  She was cardioverted x2.  The patient in the ED  had NIH stroke scale of 21.  CT brain scan negative for any bleed, no TPA  was administered due to her INR of 3.7.  The patient currently is more  awake, responsive, denies pain.  She is intubated in the ICU.         Chief Complaints:   Alert, dry mouth better  Ate breakfast  Left leg pain  Post surgery  Left hip fracture      Interval History/24 hour events:   In [facility]  Denies headache or confusion  Likely syncope caused fall with left hip fracture  MRI cancelled as low probability of stroke  Overall much better  No headache or confusion       Review Of Systems:  No headaches, dizziness, confusion, focal weakness or numbness  No deafness, tinnitus, blurred vision, or vision loss, No double vision or droopy lids  No speech or swallowing problems  Balance poor,+ Falls  No bowel or bladder incontinence  No fever or chills        Physical Exam:     Vitals:    05/23/16 0738   BP: 119/68   Pulse: 67   Resp: 18   Temp: (!) 96 F (35.6 C)    SpO2: 95%       Intake and Output Summary (Last 24 hours) at Date Time    Intake/Output Summary (Last 24 hours) at 05/23/16 1048  Last data filed at 05/22/16 2148   Gross per 24 hour   Intake              300 ml   Output                0 ml   Net              300 ml       General: No apparent distress, comfortable appearing, vital signs noted above  Head: Mild bruise right temple and normocephalic  Eyes: Anicteric sclerae, moist conjunctiva, PERRLA   ENT: Oropharynx clear; no erythema or exudates;   Neck: supple; FROM; no mass   Extremities: No clubbing, cyanosis or edema, left hip fracture  CV: Regular rate and rhythm, no murmurs  Lungs: clear  Skin: No rash, lesions, ulcers   Neurological Examination  Mental Status:   Alert, oriented, to time, place, person and situation, confused about surgery   Clear speech, normal language, including fluency, naming,  Normal short term and remote memory.  Adequate knowledge.   Affect appropriate   CN 2-12    EOM full, PERL, Normal fields and fundi, Face symmetric, tongue midline with normal bulk and no fasicualtions  Hearing, facial sensations and Gag reflex intact   Motor system: Normal tone, bulk, no involuntary movements.     Power 4/5.  No cog wheeling, tremors, or bradykinesia.   Sensory system: Normal touch, pin prick vibration in all 4 extremities; no sensory level   Reflexes:  Deep tendon reflexes symmetric, Negative Babinski  Cerebellar:  Mild tremor  Gait: Not tested  Neurovascular: No bruits.  Signs of meningeal irritation: None        Other pertinent positive findings:       Meds:     Current Facility-Administered Medications   Medication Dose Route Frequency   . calcium citrate-Vitamin D  2 tablet Oral Daily   . carvedilol  3.125 mg Oral Q12H SCH   . famotidine  20 mg Oral Daily    Or   . famotidine  20 mg Intravenous Daily   . fluticasone-salmeterol  2 puff Inhalation BID   . gabapentin  100 mg Oral TID   . levothyroxine  112 mcg Oral Daily at 0600   .  multivitamin-minerals-folic acid-coenzyme q10  1 tablet per OG tube Daily   . senna-docusate  2 tablet Oral BID   . simvastatin  10 mg Oral QHS       Labs:     Results     Procedure Component Value Units Date/Time    Prothrombin time/INR [[id number]]  (Abnormal) Collected:  05/23/16 0422    Specimen:  Blood Updated:  05/23/16 0542     PT 15.4 (H) sec      PT INR 1.2 (H)     PT Anticoag. Given Within 48 hrs. warfarin ([place]    GFR [[id number]] Collected:  05/23/16 0423     Updated:  05/23/16 0517     EGFR >60.0    Comprehensive metabolic panel [[id number]]  (Abnormal) Collected:  05/23/16 0423    Specimen:  Blood Updated:  05/23/16 0517     Glucose 94 mg/dL      BUN 46.9 (H) mg/dL      Creatinine 0.7 mg/dL      Sodium 629 mEq/L      Potassium 3.9 mEq/L      Chloride 110 mEq/L      CO2 22 mEq/L      Calcium 7.7 (L) mg/dL      Protein, Total 5.2 (L) g/dL      Albumin 2.9 (L) g/dL      AST (SGOT) 41 (H) U/L      ALT 32 U/L      Alkaline Phosphatase 60 U/L      Bilirubin, Total 1.2 mg/dL      Globulin 2.3 g/dL      Albumin/Globulin Ratio 1.3    CBC and differential [[id number]]  (Abnormal) Collected:  05/23/16 0422    Specimen:  Blood from Blood Updated:  05/23/16 0454     WBC 5.15 x10 3/uL      Hgb 7.7 (L) g/dL      Hematocrit 52.8 (L) %      Platelets 122 (L) x10 3/uL      RBC 2.45 (L) x10 6/uL      MCV 96.3 fL      MCH 31.4 pg      MCHC 32.6 g/dL  RDW 16 (H) %      MPV 10.6 fL      Nucleated RBC 1.0 /100 WBC      Absolute NRBC 0.05 (H) x10 3/uL      Neutrophils 66.4 %      Lymphocytes Automated 23.7 %      Monocytes 8.7 %      Eosinophils Automated 0.8 %      Basophils Automated 0.0 %      Immature Granulocyte 0.4 %      Neutrophils Absolute 3.42 x10 3/uL      Abs Lymph Automated 1.22 x10 3/uL      Abs Mono Automated 0.45 x10 3/uL      Abs Eos Automated 0.04 x10 3/uL      Absolute Baso Automated 0.00 x10 3/uL      Absolute Immature Granulocyte 0.02 x10 3/uL     Prepare RBC: one unit RBC [[id number]] Collected:   05/19/16 0630     Updated:  05/23/16 0044     RBC Leukoreduced RBC Leukoreduced     BLUNIT [id number]     Status transfused     PRODUCT CODE (NON READABLE) [id number]     Expiration Date [id number]     UTYPE A POS    Hemoglobin and hematocrit, blood [[id number]]  (Abnormal) Collected:  05/22/16 1125    Specimen:  Blood Updated:  05/22/16 1154     Hgb 8.5 (L) g/dL      Hematocrit 13.2 (L) %             Imaging personally reviewed, including:     Radiology Results (24 Hour)     Procedure Component Value Units Date/Time    US Venous Duplex Doppler Leg Left [[id number]] Collected:  05/23/16 1003    Order Status:  Completed Updated:  05/23/16 1007    Narrative:       CLINICAL HISTORY: Clinical left leg swelling. Concern for possible DVT.    TECHNIQUE AND FINDINGS: Duplex  evaluation of the deep venous system the  left lower extremity.    The common femoral vein, femoral vein, and popliteal vein were  visualized.  These veins demonstrate normal compression. There is no  evidence of thrombus by color Doppler. Spectral Doppler waveforms  demonstrate normal directional flow and no abnormality suggesting the  presence of occlusive thrombus. The proximal deep femoral vein is  patent. The portions of the posterior tibial and peroneal veins that  were seen compress normally and show no evidence of thrombus by Doppler.  There is no indirect evidence of calf vein thrombosis.      Impression:        No evidence of deep vein thrombosis involving the left lower  extremity.    Annabell Sabal, MD   05/23/2016 10:03 AM      XR Chest AP Portable [[id number]] Collected:  05/22/16 1649    Order Status:  Completed Updated:  05/22/16 1655    Narrative:       HISTORY: New  crackles. Evaluate for pulmonary congestion.    COMMENT:  Frontal view of the chest was obtained and compared to 05/18/2016.    There is motion. There is mild basilar atelectasis. Pulmonary  vascularity within normal limits. Heart size stable. There is  patient  rotation.      Impression:         Mild basilar atelectasis. No pulmonary vascular congestion.    Clide Cliff, MD   05/22/2016 4:51 PM  Assessment:     Patient Active Problem List   Diagnosis   . Celiac disease   . Scoliosis   . Atrial fibrillation   . Mitral valve disorder   . Disease of tricuspid valve   . Chronic airway obstruction, not elsewhere classified   . AF (atrial fibrillation)   . Paroxysmal a-fib   . CHF (congestive heart failure)   . Ischemic embolic stroke   . Decreased diffusion capacity   . PFO (patent foramen ovale)   . Acquired hypothyroidism   . Chronic systolic heart failure   . PAH (pulmonary artery hypertension)   . Non-rheumatic mitral regurgitation   . Respiratory failure   . Ischemic stroke   . Altered mental status, unspecified altered mental status type   . Closed pertrochanteric fracture of femur, left, initial encounter     Fall  Syncope  Left hip fracture  Coagulopathy      Plan/Recommendations:   General measures:  Diet/activity/Precuations: Per routine from admitting orders  Symptomatic measures: as needed  Diagnostics:Reviewed CT/CTA  Medications:Noted  Consultation:Ortho for hip surgery  Physical/Occupational/Speech Therapy:As needed  Patient/Family Education/Counseling:Provided        Anticipated discharge disposition and date: Unknown     Case discussed with: Nursing/Team        Signed by: Shawnee Knapp, MD

## 2016-05-23 NOTE — Progress Notes (Addendum)
Orthopedic Daily Progress Note    05/23/2016   5:50 AM    [name] is a 82 y.o. female  3 Days Post-Op  S/p Procedure(s) (LRB):  CLOSED REDUCTION, INSERTION, HIP IM NAIL (GAMMA) (Left) L IT fx    Subjective: NAEON, she endorses neck and back pain, no pain to operative site.  Patient denies any nausea, vomiting, or fevers.     Physical Exam:  Vitals:    05/23/16 0500   BP: 107/58   Pulse: (!) 40   Resp: 18   Temp: 97.2 F (36.2 C)   SpO2: 96%        Intake/Output Summary (Last 24 hours) at 05/23/16 0550  Last data filed at 05/22/16 2148   Gross per 24 hour   Intake              300 ml   Output                0 ml   Net              300 ml       Left Lower Extremity:   Dressings intact, mild SS strikethrough noted; dressings changed at bedside                                                                                                                Wiggles toes well                                                                                                           SILT SP/DP/T                                                                                                                     2+ DP/PT  Cap refill <2 secs      Results     Procedure Component Value Units Date/Time    Prothrombin time/INR [[id number]]  (Abnormal) Collected:  05/23/16 0422    Specimen:  Blood Updated:  05/23/16 0542     PT 15.4 (H) sec  PT INR 1.2 (H)     PT Anticoag. Given Within 48 hrs. warfarin (Couma    GFR [[id number]] Collected:  05/23/16 0423     Updated:  05/23/16 0517     EGFR >60.0    Comprehensive metabolic panel [[id number]]  (Abnormal) Collected:  05/23/16 0423    Specimen:  Blood Updated:  05/23/16 0517     Glucose 94 mg/dL      BUN 78.2 (H) mg/dL      Creatinine 0.7 mg/dL      Sodium 956 mEq/L      Potassium 3.9 mEq/L      Chloride 110 mEq/L      CO2 22 mEq/L      Calcium 7.7 (L) mg/dL      Protein, Total 5.2 (L) g/dL      Albumin 2.9 (L) g/dL      AST (SGOT) 41 (H) U/L      ALT 32 U/L      Alkaline  Phosphatase 60 U/L      Bilirubin, Total 1.2 mg/dL      Globulin 2.3 g/dL      Albumin/Globulin Ratio 1.3    CBC and differential [[id number]]  (Abnormal) Collected:  05/23/16 0422    Specimen:  Blood from Blood Updated:  05/23/16 0454     WBC 5.15 x10 3/uL      Hgb 7.7 (L) g/dL      Hematocrit 21.3 (L) %      Platelets 122 (L) x10 3/uL      RBC 2.45 (L) x10 6/uL      MCV 96.3 fL      MCH 31.4 pg      MCHC 32.6 g/dL      RDW 16 (H) %      MPV 10.6 fL      Nucleated RBC 1.0 /100 WBC      Absolute NRBC 0.05 (H) x10 3/uL      Neutrophils 66.4 %      Lymphocytes Automated 23.7 %      Monocytes 8.7 %      Eosinophils Automated 0.8 %      Basophils Automated 0.0 %      Immature Granulocyte 0.4 %      Neutrophils Absolute 3.42 x10 3/uL      Abs Lymph Automated 1.22 x10 3/uL      Abs Mono Automated 0.45 x10 3/uL      Abs Eos Automated 0.04 x10 3/uL      Absolute Baso Automated 0.00 x10 3/uL      Absolute Immature Granulocyte 0.02 x10 3/uL     Prepare RBC: one unit RBC [[id number]] Collected:  05/19/16 0630     Updated:  05/23/16 0044     RBC Leukoreduced RBC Leukoreduced     BLUNIT [id number]     Status transfused     PRODUCT CODE (NON READABLE) [id number]     Expiration Date [id number]     UTYPE A POS    Hemoglobin and hematocrit, blood [[id number]]  (Abnormal) Collected:  05/22/16 1125    Specimen:  Blood Updated:  05/22/16 1154     Hgb 8.5 (L) g/dL      Hematocrit 38.7 (L) %           Assessment: s/p Procedure(s) (LRB):  CLOSED REDUCTION, INSERTION, HIP IM NAIL (GAMMA) (Left)L IT fx    Plan:   Mobility: Out of bed as tolerated with PT/OT   Pain control: Continue to  wean/titrate to appropriate oral regimen   DVT Prophylaxis (per Ortho Trauma service Protocol): Lovenox for 6 weeks postop (fractures from Pelvis to subtrochanteric femur), currently on hold per Medicine, will resume when appropriate   Foley catheter status: Per protocol   Further surgical plans: No further Orthopedic plans      RUE: WBAT   LUE:  WBAT   RLE:   WBAT   LLE:  WBAT   Disposition: Skilled Nursing Facility when medically stable       [facility]  Dept of Orthopedics  Page [phone] or call [phone] for questions      Agree w/ above      Shawnie Pons MD      N.B. The review of the patient's medications does not in any way constitute an endorsement, by this clinician,  of their use, dosage, indications, route, efficacy, interactions, or other clinical parameters.

## 2016-05-24 LAB — CBC AND DIFFERENTIAL
Absolute NRBC: 0.09 10*3/uL — ABNORMAL HIGH
Basophils Absolute Automated: 0.01 10*3/uL (ref 0.00–0.20)
Basophils Automated: 0.2 %
Eosinophils Absolute Automated: 0.09 10*3/uL (ref 0.00–0.70)
Eosinophils Automated: 1.7 %
Hematocrit: 25.4 % — ABNORMAL LOW (ref 37.0–47.0)
Hgb: 8.2 g/dL — ABNORMAL LOW (ref 12.0–16.0)
Immature Granulocytes Absolute: 0.05 10*3/uL
Immature Granulocytes: 0.9 %
Lymphocytes Absolute Automated: 0.98 10*3/uL (ref 0.50–4.40)
Lymphocytes Automated: 18.1 %
MCH: 31.7 pg (ref 28.0–32.0)
MCHC: 32.3 g/dL (ref 32.0–36.0)
MCV: 98.1 fL (ref 80.0–100.0)
MPV: 10.9 fL (ref 9.4–12.3)
Monocytes Absolute Automated: 0.52 10*3/uL (ref 0.00–1.20)
Monocytes: 9.6 %
Neutrophils Absolute: 3.75 10*3/uL (ref 1.80–8.10)
Neutrophils: 69.5 %
Nucleated RBC: 1.7 /100 WBC — ABNORMAL HIGH (ref 0.0–1.0)
Platelets: 130 10*3/uL — ABNORMAL LOW (ref 140–400)
RBC: 2.59 10*6/uL — ABNORMAL LOW (ref 4.20–5.40)
RDW: 15 % (ref 12–15)
WBC: 5.4 10*3/uL (ref 3.50–10.80)

## 2016-05-24 LAB — COMPREHENSIVE METABOLIC PANEL
ALT: 28 U/L (ref 0–55)
AST (SGOT): 35 U/L — ABNORMAL HIGH (ref 5–34)
Albumin/Globulin Ratio: 1.3 (ref 0.9–2.2)
Albumin: 2.9 g/dL — ABNORMAL LOW (ref 3.5–5.0)
Alkaline Phosphatase: 67 U/L (ref 37–106)
BUN: 20 mg/dL — ABNORMAL HIGH (ref 7.0–19.0)
Bilirubin, Total: 1.3 mg/dL — ABNORMAL HIGH (ref 0.2–1.2)
CO2: 21 mEq/L — ABNORMAL LOW (ref 22–29)
Calcium: 7.8 mg/dL — ABNORMAL LOW (ref 7.9–10.2)
Chloride: 109 mEq/L (ref 100–111)
Creatinine: 0.7 mg/dL (ref 0.6–1.0)
Globulin: 2.3 g/dL (ref 2.0–3.6)
Glucose: 86 mg/dL (ref 70–100)
Potassium: 4.1 mEq/L (ref 3.5–5.1)
Protein, Total: 5.2 g/dL — ABNORMAL LOW (ref 6.0–8.3)
Sodium: 140 mEq/L (ref 136–145)

## 2016-05-24 LAB — GFR: EGFR: >60

## 2016-05-24 LAB — PT/INR
PT INR: 1.1 (ref 0.9–1.1)
PT: 14 s (ref 12.6–15.0)

## 2016-05-24 MED ORDER — CARVEDILOL 6.25 MG PO TABS
6.2500 mg | ORAL_TABLET | Freq: Two times a day (BID) | ORAL | Status: DC
Start: 2016-05-24 — End: 2016-05-26
  Administered 2016-05-25 – 2016-05-26 (×3): 6.25 mg via ORAL
  Filled 2016-05-24 (×3): qty 1

## 2016-05-24 NOTE — Progress Notes (Signed)
NEUROLOGY PROGRESS NOTE    Date Time: 05/24/16 9:55 AM  Patient Name: [name],[name]  Attending Physician: Shawnee Knapp, MD    BRIEF HISTORY:   REASON FOR CONSULTATION:  Unwitnessed fall, altered mental status, rule out stroke.     HISTORY OF PRESENT ILLNESS:  The patient is an 82 year old woman with history of prior stroke, mitral  regurgitation, atrial fibrillation on Coumadin, congestive heart failure  who comes into the hospital with unwitnessed fall, left-sided weakness,  concerned about stroke.  The patient is currently intubated but alert.   History is mostly from medical records.  The patient was watching TV with  her husband last night when she went to another room; 30 to 40 minutes  later husband went to check on her and found her lying on the floor.  She  was unconscious and would not answer.  EMS was called.  The patient was  noted to have wide complex tachycardia en route.  This was suspected as  ventricular tachycardia.  She was cardioverted x2.  The patient in the ED  had NIH stroke scale of 21.  CT brain scan negative for any bleed, no TPA  was administered due to her INR of 3.7.  The patient currently is more  awake, responsive, denies pain.  She is intubated in the ICU.         Chief Complaints:   Alert, dry mouth better  Ate breakfast  Left leg pain  Post surgery  Left hip fracture      Interval History/24 hour events:   In [facility]  Denies headache or confusion  Likely syncope from cardiac arrhythmia caused fall with left hip fracture  MRI cancelled as low probability of stroke  Overall much better  No headache or confusion       Review Of Systems:  No headaches, dizziness, confusion, focal weakness or numbness  No deafness, tinnitus, blurred vision, or vision loss, No double vision or droopy lids  No speech or swallowing problems  Balance poor,+ Falls  No bowel or bladder incontinence  No fever or chills        Physical Exam:     Vitals:    05/24/16 0738   BP: 123/90   Pulse: (!) 55   Resp: 16    Temp: 97.5 F (36.4 C)   SpO2: 93%       Intake and Output Summary (Last 24 hours) at Date Time    Intake/Output Summary (Last 24 hours) at 05/24/16 0955  Last data filed at 05/23/16 2125   Gross per 24 hour   Intake              200 ml   Output                0 ml   Net              200 ml       General: No apparent distress, comfortable appearing, vital signs noted above  Head: Mild bruise right temple and normocephalic  Eyes: Anicteric sclerae, moist conjunctiva, PERRLA   ENT: Oropharynx clear; no erythema or exudates;   Neck: supple; FROM; no mass   Extremities: No clubbing, cyanosis or edema, left hip fracture  CV: Regular rate and rhythm, no murmurs  Lungs: clear  Skin: No rash, lesions, ulcers   Neurological Examination  Mental Status:   Alert, oriented, to time, place, person and situation, confused about surgery   Clear speech, normal language,  including fluency, naming,     Normal short term and remote memory.  Adequate knowledge.   Affect appropriate   CN 2-12    EOM full, PERL, Normal fields and fundi, Face symmetric, tongue midline with normal bulk and no fasicualtions  Hearing, facial sensations and Gag reflex intact   Motor system: Normal tone, bulk, no involuntary movements.     Power 4/5.  No cog wheeling, tremors, or bradykinesia.   Sensory system: Normal touch, pin prick vibration in all 4 extremities; no sensory level   Reflexes:  Deep tendon reflexes symmetric, Negative Babinski  Cerebellar:  Mild tremor  Gait: Not tested  Neurovascular: No bruits.  Signs of meningeal irritation: None        Other pertinent positive findings:       Meds:     Current Facility-Administered Medications   Medication Dose Route Frequency   . calcium citrate-Vitamin D  2 tablet Oral Daily   . carvedilol  3.125 mg Oral Q12H SCH   . famotidine  20 mg Oral Daily    Or   . famotidine  20 mg Intravenous Daily   . fluticasone-salmeterol  2 puff Inhalation BID   . gabapentin  100 mg Oral TID   . levothyroxine  112 mcg  Oral Daily at 0600   . multivitamin-minerals-folic acid-coenzyme q10  1 tablet per OG tube Daily   . senna-docusate  2 tablet Oral BID   . simvastatin  10 mg Oral QHS       Labs:     Results     Procedure Component Value Units Date/Time    Prothrombin time/INR [[id number]] Collected:  05/24/16 0329    Specimen:  Blood Updated:  05/24/16 0647     PT 14.0 sec      PT INR 1.1     PT Anticoag. Given Within 48 hrs. warfarin (Couma    Comprehensive metabolic panel [[id number]]  (Abnormal) Collected:  05/24/16 0329    Specimen:  Blood Updated:  05/24/16 0640     Glucose 86 mg/dL      BUN 78.2 (H) mg/dL      Creatinine 0.7 mg/dL      Sodium 956 mEq/L      Potassium 4.1 mEq/L      Chloride 109 mEq/L      CO2 21 (L) mEq/L      Calcium 7.8 (L) mg/dL      Protein, Total 5.2 (L) g/dL      Albumin 2.9 (L) g/dL      AST (SGOT) 35 (H) U/L      ALT 28 U/L      Alkaline Phosphatase 67 U/L      Bilirubin, Total 1.3 (H) mg/dL      Globulin 2.3 g/dL      Albumin/Globulin Ratio 1.3    GFR [[id number]] Collected:  05/24/16 0329     Updated:  05/24/16 0640     EGFR >60.0    CBC and differential [[id number]]  (Abnormal) Collected:  05/24/16 0329    Specimen:  Blood from Blood Updated:  05/24/16 0630     WBC 5.40 x10 3/uL      Hgb 8.2 (L) g/dL      Hematocrit 41.3 (L) %      Platelets 130 (L) x10 3/uL      RBC 2.59 (L) x10 6/uL      MCV 98.1 fL      MCH 31.7 pg      MCHC  32.3 g/dL      RDW 15 %      MPV 10.9 fL      Nucleated RBC 1.7 (H) /100 WBC      Absolute NRBC 0.09 (H) x10 3/uL      Neutrophils 69.5 %      Lymphocytes Automated 18.1 %      Monocytes 9.6 %      Eosinophils Automated 1.7 %      Basophils Automated 0.2 %      Immature Granulocyte 0.9 %      Neutrophils Absolute 3.75 x10 3/uL      Abs Lymph Automated 0.98 x10 3/uL      Abs Mono Automated 0.52 x10 3/uL      Abs Eos Automated 0.09 x10 3/uL      Absolute Baso Automated 0.01 x10 3/uL      Absolute Immature Granulocyte 0.05 x10 3/uL     Hemoglobin and hematocrit, blood [[id number]]   (Abnormal) Collected:  05/23/16 1318    Specimen:  Blood Updated:  05/23/16 1357     Hgb 8.0 (L) g/dL      Hematocrit 16.1 (L) %             Imaging personally reviewed, including:     Radiology Results (24 Hour)     Procedure Component Value Units Date/Time    US Venous Duplex Doppler Leg Left [[id number]] Collected:  05/23/16 1003    Order Status:  Completed Updated:  05/23/16 1007    Narrative:       CLINICAL HISTORY: Clinical left leg swelling. Concern for possible DVT.    TECHNIQUE AND FINDINGS: Duplex  evaluation of the deep venous system the  left lower extremity.    The common femoral vein, femoral vein, and popliteal vein were  visualized.  These veins demonstrate normal compression. There is no  evidence of thrombus by color Doppler. Spectral Doppler waveforms  demonstrate normal directional flow and no abnormality suggesting the  presence of occlusive thrombus. The proximal deep femoral vein is  patent. The portions of the posterior tibial and peroneal veins that  were seen compress normally and show no evidence of thrombus by Doppler.  There is no indirect evidence of calf vein thrombosis.      Impression:        No evidence of deep vein thrombosis involving the left lower  extremity.    Annabell Sabal, MD   05/23/2016 10:03 AM              Assessment:     Patient Active Problem List   Diagnosis   . Celiac disease   . Scoliosis   . Atrial fibrillation   . Mitral valve disorder   . Disease of tricuspid valve   . Chronic airway obstruction, not elsewhere classified   . AF (atrial fibrillation)   . Paroxysmal a-fib   . CHF (congestive heart failure)   . Ischemic embolic stroke   . Decreased diffusion capacity   . PFO (patent foramen ovale)   . Acquired hypothyroidism   . Chronic systolic heart failure   . PAH (pulmonary artery hypertension)   . Non-rheumatic mitral regurgitation   . Respiratory failure   . Ischemic stroke   . Altered mental status, unspecified altered mental status type   . Closed  pertrochanteric fracture of femur, left, initial encounter     Fall  Syncope, cardiac arrhythmia   Left hip fracture  Coagulopathy  Plan/Recommendations:   General measures:  Diet/activity/Precuations: Per routine from admitting orders  Symptomatic measures: as needed  Diagnostics:Reviewed CT/CTA, loop recorder planned  Medications:Noted  Consultation:Ortho for hip surgery  Physical/Occupational/Speech Therapy:As needed  Patient/Family Education/Counseling:Provided        Anticipated discharge disposition and date: Unknown     Case discussed with: Nursing/Team        Signed by: Shawnee Knapp, MD

## 2016-05-24 NOTE — Plan of Care (Signed)
Problem: Safety  Goal: Patient will be free from injury during hospitalization  Outcome: Progressing  Patient   05/24/16 0124   Goal/Interventions addressed this shift   Patient will be free from injury during hospitalization  Assess patient's risk for falls and implement fall prevention plan of care per policy;Provide and maintain safe environment;Ensure appropriate safety devices are available at the bedside;Include patient/ family/ care giver in decisions related to safety;Assess for patients risk for elopement and implement Elopement Risk Plan per policy;Provide alternative method of communication if needed (communication boards, writing)     Goal: Patient will be free from infection during hospitalization  Outcome: Progressing      Problem: Pain  Goal: Pain at adequate level as identified by patient  Outcome: Progressing  Neurologically stable , discomfort on left leg during movement but patient doesn't want pain medication for now.  Surgical dressing intact with old drainage.  Fall precaution maintained.

## 2016-05-24 NOTE — Plan of Care (Signed)
Problem: Compromised Tissue integrity  Goal: Damaged tissue is healing and protected  Outcome: Progressing   05/24/16 1552   Goal/Interventions addressed this shift   Damaged tissue is healing and protected  Monitor/assess Braden scale every shift;Increase activity as tolerated/progressive mobility;Avoid shearing injuries;Keep intact skin clean and dry;Use bath wipes, not soap and water, for daily bathing     Goal: Nutritional status is improving  Outcome: Progressing   05/24/16 1552   Goal/Interventions addressed this shift   Nutritional status is improving Allow adequate time for meals;Include patient/patient care companion in decisions related to nutrition       Problem: Safety  Goal: Patient will be free from injury during hospitalization  Outcome: Progressing   05/24/16 1552   Goal/Interventions addressed this shift   Patient will be free from injury during hospitalization  Assess patient's risk for falls and implement fall prevention plan of care per policy;Use appropriate transfer methods;Provide and maintain safe environment;Ensure appropriate safety devices are available at the bedside;Hourly rounding;Include patient/ family/ care giver in decisions related to safety     Goal: Patient will be free from infection during hospitalization  Outcome: Progressing   05/24/16 1552   Goal/Interventions addressed this shift   Free from Infection during hospitalization Assess and monitor for signs and symptoms of infection;Monitor lab/diagnostic results;Monitor all insertion sites (i.e. indwelling lines, tubes, urinary catheters, and drains)       Problem: Pain  Goal: Pain at adequate level as identified by patient  Outcome: Progressing   05/24/16 1552   Goal/Interventions addressed this shift   Pain at adequate level as identified by patient Identify patient comfort function goal;Evaluate if patient comfort function goal is met;Include patient/patient care companion in decisions related to pain management as needed        Problem: Every Day - Stroke  Goal: Mobility/Activity is maintained at optimal level for patient  Outcome: Progressing   05/24/16 1552   Goal/Interventions addressed this shift   Mobility/activity is maintained at optimal level for patient Increase mobility as tolerated/progressive mobility;Encourage independent activity per ability;Maintain proper body alignment       Problem: Impaired Mobility  Goal: Mobility/Activity is maintained at optimal level for patient  Outcome: Progressing   05/24/16 1552   Goal/Interventions addressed this shift   Mobility/activity is maintained at optimal level for patient Increase mobility as tolerated/progressive mobility;Encourage independent activity per ability;Maintain proper body alignment       Comments: A&O x4, FC, clear speech, MAE with limited movement of LLE. C/o LLE pain with movement, but denies any pain med. A-fib with PVCs on tele, rate controlled. Tolerating gluten restricted diet, takes pills whole. NPO after midnight for ICD implant tomorrow on 05/25/16. Serosanguinous drainage noted on L hip surgical site dressings after changing new dressings this morning. Dressings reinforced per orthopedic resident MD Curley. Continent of urine, no BM. Husband at the bedside, updated with plan of care. Fall precautions reinforced, call bell within reach. Will continue to monitor.

## 2016-05-24 NOTE — Progress Notes (Signed)
[facility]   PROGRESS NOTE     [facility]       Date Time: 05/24/16 12:39 PM Patient Name: [name], [name]  Medical Record #:  [id number] Account#:  [id number] Admission Date:  05/18/2016      Assessment:    Admitted with LOC shocked out of VT by EMS with recurrent torsades seen on telemetry 05/19/16.  Prolonged QT interval, tikosyn d/c'd   Recurrent atrial fibrillation s/pextensive bilateral rotor modulation with temporal dispersion, pulmonary vein isolation, and right isthmus ablation February 26, 2016.   Unable to take amiodarone or Tikosyn (pulmonary toxicity and long QT)   CHA2DS2-VASc 3, off coumadin for surgery   TEESeptember 15, 2017, showing a mildly depressed left ventricular function 45-50%, likely tachy-mediated. Then more depressed on echo 10/17 35%, cath at the time showed no sig CAD, now 25% on echo 05/19/16   Left femur fracture, s/p surgery   Depressed TSH, normal free T4, slightly low T3   Anemia, Hgb low but stable    Recommendations    ICD implant tomorrow about 3 pm with Dr. Amado Coe   Increase coreg to 6.25      Patient Active Problem List   Diagnosis   . Celiac disease   . Scoliosis   . Atrial fibrillation   . Mitral valve disorder   . Disease of tricuspid valve   . Chronic airway obstruction, not elsewhere classified   . AF (atrial fibrillation)   . Paroxysmal a-fib   . CHF (congestive heart failure)   . Ischemic embolic stroke   . Decreased diffusion capacity   . PFO (patent foramen ovale)   . Acquired hypothyroidism   . Chronic systolic heart failure   . PAH (pulmonary artery hypertension)   . Non-rheumatic mitral regurgitation   . Respiratory failure   . Ischemic stroke   . Altered mental status, unspecified altered mental status type   . Closed pertrochanteric fracture of femur, left, initial encounter         Medications:      Scheduled Meds:      calcium citrate-Vitamin D 2 tablet Oral Daily   carvedilol 3.125 mg Oral Q12H SCH   famotidine 20 mg Oral  Daily   Or      famotidine 20 mg Intravenous Daily   fluticasone-salmeterol 2 puff Inhalation BID   gabapentin 100 mg Oral TID   levothyroxine 112 mcg Oral Daily at 0600   multivitamin-minerals-folic acid-coenzyme q10 1 tablet per OG tube Daily   senna-docusate 2 tablet Oral BID   simvastatin 10 mg Oral QHS           Subjective:   Sore at left hip surgical area    Physical Exam:     VITAL SIGNS PHYSICAL EXAM   Temp:  [96.4 F (35.8 C)-97.5 F (36.4 C)] 97.5 F (36.4 C)  Heart Rate:  [53-89] 55  Resp Rate:  [16-19] 16  BP: (118-149)/(62-103) 123/90  Temp (24hrs), Avg:96.9 F (36.1 C), Min:96.4 F (35.8 C), Max:97.5 F (36.4 C)      Telemetry: Sinus rhythm, frequent PVC   General: thin, awake, alert, breathing comfortable, no acute distress  HEENT: normocephalic, PER, anicteric, no cyanosis.  Mucosa moist and pale.  No proptosis or lid lag.  Cardiovascular: regular rate and rhythm, S1, S2, no S3, no S4 occasional ectopies. A systolic murmur is present  Neck: no JVD  Lungs: clear to auscultation   Abdomen: bowel sound present  Extremities: left hip post  surgery, edema  Pulse: equal radial pulses  Neurological: Alert and oriented Musculoskeletal:  normal tone  Skin:  Warm and dry       Labs:             Recent Labs  Lab 05/22/16  0413   Magnesium 2.2       Recent Labs  Lab 05/24/16  0329  05/19/16  1800   PT 14.0 More results in Results Review 39.5*   PT INR 1.1 More results in Results Review 4.2*   PTT  --   --  84*   More results in Results Review = values in this interval not displayed.    Recent Labs  Lab 05/24/16  0329 05/23/16  1318 05/23/16  0422  05/22/16  0413   WBC 5.40  --  5.15  --  7.87   Hgb 8.2* 8.0* 7.7* More results in Results Review 6.8*   Hematocrit 25.4* 25.2* 23.6* More results in Results Review 21.4*   Platelets 130*  --  122*  --  144   More results in Results Review = values in this interval not displayed.    Recent Labs  Lab 05/24/16  0329 05/23/16  0423 05/22/16  0413   Sodium 140 139 140    Potassium 4.1 3.9 4.3   Chloride 109 110 110   CO2 21* 22 22   BUN 20.0* 26.0* 28.0*   Creatinine 0.7 0.7 0.7   EGFR >60.0 >60.0 >60.0   Glucose 86 94 123*   Calcium 7.8* 7.7* 7.9       Recent Labs  Lab 05/19/16  0413   Thyroid Stimulating Hormone 0.09*     Estimated Creatinine Clearance: 58.1 mL/min (based on SCr of 0.7 mg/dL).      Signed by     Lonzo Cloud, MD    [facility]  [facility]    Arrhythmia Spectralink [phone] (8am-4:30pm)  After hours, non urgent consult line [phone]  After Hours, urgent consults [phone]  For EP questions or consults during the week, easiest to call Spectralink [phone]   You can also reach [place] 24/7 through our main EP office [phone].

## 2016-05-24 NOTE — Progress Notes (Signed)
Orthopedic Daily Progress Note    05/24/2016   5:49 AM    [name] is a 82 y.o. female  4 Days Post-Op  S/p Procedure(s) (LRB):  CLOSED REDUCTION, INSERTION, HIP IM NAIL (GAMMA) (Left) L IT fx    Subjective: NAEON, sitting up in bed.  Patient to receive ICD implant on Monday. She denies any nausea, vomiting, or fevers.     Physical Exam:  Vitals:    05/23/16 2247   BP: 122/62   Pulse: (!) 53   Resp: 18   Temp: 97 F (36.1 C)   SpO2: 93%        Intake/Output Summary (Last 24 hours) at 05/24/16 0549  Last data filed at 05/23/16 2125   Gross per 24 hour   Intake              200 ml   Output                0 ml   Net              200 ml       Left Lower Extremity:   Dressings c/d/i  Wiggles toes well                                                                                                           SILT SP/DP/T                                                                                                                     2+ DP/PT  Cap refill <2 secs      Results     Procedure Component Value Units Date/Time    Comprehensive metabolic panel [[id number]] Collected:  05/24/16 0329    Specimen:  Blood Updated:  05/24/16 0329    CBC and differential [[id number]] Collected:  05/24/16 0329    Specimen:  Blood from Blood Updated:  05/24/16 0329    Prothrombin time/INR [[id number]] Collected:  05/24/16 0329    Specimen:  Blood Updated:  05/24/16 0329    Hemoglobin and hematocrit, blood [[id number]]  (Abnormal) Collected:  05/23/16 1318    Specimen:  Blood Updated:  05/23/16 1357     Hgb 8.0 (L) g/dL      Hematocrit 08.6 (L) %           Assessment: s/p Procedure(s) (LRB):  CLOSED REDUCTION, INSERTION, HIP IM NAIL (GAMMA) (Left)L IT fx    Plan:   Mobility: Out of bed as tolerated with PT/OT   Pain control: Continue  to wean/titrate to appropriate oral regimen   DVT Prophylaxis (per Ortho Trauma service Protocol): Lovenox for 6 weeks postop (fractures from Pelvis to subtrochanteric femur), currently on hold per  Medicine, will resume when appropriate   Foley catheter status: Per protocol   Further surgical plans: No further Orthopedic plans      RUE: WBAT   LUE:  WBAT   RLE:  WBAT   LLE:  WBAT   Disposition: Skilled Nursing Facility when medically stable       [employer]  Dept of Orthopedics  Page [phone] or call [phone] for questions

## 2016-05-24 NOTE — Progress Notes (Signed)
MEDICINE PROGRESS NOTE    Date Time: 05/24/16 9:24 PM  Patient Name: [name],[name]  Attending Physician: Janalee Dane, MD    Assessment:   Active Problems:    Atrial fibrillation    CHF (congestive heart failure)    Ischemic embolic stroke    Decreased diffusion capacity    PFO (patent foramen ovale)    Acquired hypothyroidism    Chronic systolic heart failure    PAH (pulmonary artery hypertension)    Non-rheumatic mitral regurgitation    Respiratory failure    Ischemic stroke    Altered mental status, unspecified altered mental status type    Closed pertrochanteric fracture of femur, left, initial encounter  Resolved Problems:    * No resolved hospital problems. *    82 y.o. female with left displaced reverse obliquity intertrochanteric femur fracture and VT    Plan:     1) Left displaced intertrochenteric fracture - s/p closed reduction - PT/OT working with pt and recommended subacute rehab - pain well controlled   lovenox for 6 weeks   swellingof the leg most likely post op as [place] negative    2) VT and torsades ; ep on board  amio dcd  Sec to lung disease  QTc stable now   ICD to be placed on Monday- tomm    3) Recurrent afib : On lovenox - coumadin on hold for now after surgery- resume whrn appropiate      4)Hypothyroidism : ON replacement     5) Anemia : S/p Blood transfusion- continue to monitor- Hb stable today    6) Disp : Placement   Case discussed with: RN and pt     Safety Checklist:     DVT prophylaxis:  CHEST guideline (See page [id number]) Chemical   Foley:  [place] Rn Foley protocol Not present   IVs:  Peripheral IV   PT/OT: Ordered   Daily CBC & or Chem ordered:  SHM/ABIM guidelines (see #5) Yes, due to clinical and lab instability   Reference for approximate charges of common labs: CBC auto diff - $76  BMP - $99  Mg - $79    Lines:     Patient Lines/Drains/Airways Status    Active PICC Line / CVC Line / PIV Line / Drain / Airway / Intraosseous Line / Epidural Line / ART Line / Line / Wound /  Pressure Ulcer / NG/OG Tube     Name:   Placement date:   Placement time:   Site:   Days:    Peripheral IV 05/21/16 Right Forearm  05/21/16    0312    Forearm    less than 1    Incision Site 05/20/16 Hip Left  05/20/16    1643      1                 Disposition: (Please see PAF column for Expected D/C Date)   Today's date: 05/24/2016  Admit Date: 05/18/2016 10:42 PM  LOS: 5  Clinical Milestones: On lovenox- icd tomm   Anticipated discharge needs:placement       Subjective     CC: <principal problem not specified>    Interval History/24 hour events:no fever chills abd pain nausea vomiting or diarhea - pain well controlled    HPI/Subjective: came in with shock for VT    Review of Systems:   As per HPI     Physical Exam:     VITAL SIGNS PHYSICAL EXAM  Temp:  [96.9 F (36.1 C)-99.4 F (37.4 C)] 99.2 F (37.3 C)  Heart Rate:  [53-84] 57  Resp Rate:  [16-19] 18  BP: (115-139)/(62-90) 137/66  Blood Glucose:        Intake/Output Summary (Last 24 hours) at 05/24/16 2124  Last data filed at 05/23/16 2125   Gross per 24 hour   Intake              200 ml   Output                0 ml   Net              200 ml    Physical Exam  General: awake, alert X o3  Cardiovascular: regular rate and rhythm, no murmurs, rubs or gallops  Lungs: clear to auscultation bilaterally, without wheezing, rhonchi, or rales  Abdomen: soft, non-tender, non-distended; no palpable masses,  normoactive bowel sounds  Extremities: left leg swelling and limited motion         Meds:     Medications were reviewed:    Labs:     Labs (last 72 hours):      Recent Labs  Lab 05/24/16  0329 05/23/16  1318 05/23/16  0422   WBC 5.40  --  5.15   Hgb 8.2* 8.0* 7.7*   Hematocrit 25.4* 25.2* 23.6*   Platelets 130*  --  122*         Recent Labs  Lab 05/24/16  0329 05/23/16  0422  05/19/16  1800   PT 14.0 15.4* More results in Results Review 39.5*   PT INR 1.1 1.2* More results in Results Review 4.2*   PTT  --   --   --  84*   More results in Results Review = values in  this interval not displayed.   Recent Labs  Lab 05/24/16  0329 05/23/16  0423   Sodium 140 139   Potassium 4.1 3.9   Chloride 109 110   CO2 21* 22   BUN 20.0* 26.0*   Creatinine 0.7 0.7   Calcium 7.8* 7.7*   Albumin 2.9* 2.9*   Protein, Total 5.2* 5.2*   Bilirubin, Total 1.3* 1.2   Alkaline Phosphatase 67 60   ALT 28 32   AST (SGOT) 35* 41*   Glucose 86 94                       Signed by: Janalee Dane, MD

## 2016-05-25 ENCOUNTER — Ambulatory Visit (INDEPENDENT_AMBULATORY_CARE_PROVIDER_SITE_OTHER): Payer: Self-pay

## 2016-05-25 ENCOUNTER — Inpatient Hospital Stay: Payer: Medicare Other | Admitting: Certified Registered"

## 2016-05-25 ENCOUNTER — Encounter: Payer: Self-pay | Admitting: Orthopaedic Surgery

## 2016-05-25 ENCOUNTER — Inpatient Hospital Stay: Payer: Medicare Other

## 2016-05-25 ENCOUNTER — Encounter
Admission: EM | Disposition: A | Discharge status: Skilled Nursing Facility | Payer: Self-pay | Source: Home / Self Care | Attending: Internal Medicine

## 2016-05-25 ENCOUNTER — Other Ambulatory Visit: Payer: Medicare Other

## 2016-05-25 LAB — CBC AND DIFFERENTIAL
Absolute NRBC: 0.11 10*3/uL — ABNORMAL HIGH
Basophils Absolute Automated: 0.01 10*3/uL (ref 0.00–0.20)
Basophils Automated: 0.1 %
Eosinophils Absolute Automated: 0.15 10*3/uL (ref 0.00–0.70)
Eosinophils Automated: 2 %
Hematocrit: 27 % — ABNORMAL LOW (ref 37.0–47.0)
Hgb: 8.7 g/dL — ABNORMAL LOW (ref 12.0–16.0)
Immature Granulocytes Absolute: 0.05 10*3/uL
Immature Granulocytes: 0.7 %
Lymphocytes Absolute Automated: 1.57 10*3/uL (ref 0.50–4.40)
Lymphocytes Automated: 20.8 %
MCH: 31.6 pg (ref 28.0–32.0)
MCHC: 32.2 g/dL (ref 32.0–36.0)
MCV: 98.2 fL (ref 80.0–100.0)
MPV: 10.6 fL (ref 9.4–12.3)
Monocytes Absolute Automated: 0.79 10*3/uL (ref 0.00–1.20)
Monocytes: 10.4 %
Neutrophils Absolute: 4.99 10*3/uL (ref 1.80–8.10)
Neutrophils: 66 %
Nucleated RBC: 1.5 /100 WBC — ABNORMAL HIGH (ref 0.0–1.0)
Platelets: 149 10*3/uL (ref 140–400)
RBC: 2.75 10*6/uL — ABNORMAL LOW (ref 4.20–5.40)
RDW: 15 % (ref 12–15)
WBC: 7.56 10*3/uL (ref 3.50–10.80)

## 2016-05-25 LAB — GFR: EGFR: >60

## 2016-05-25 LAB — BASIC METABOLIC PANEL
BUN: 15 mg/dL (ref 7.0–19.0)
CO2: 21 mEq/L — ABNORMAL LOW (ref 22–29)
Calcium: 8 mg/dL (ref 7.9–10.2)
Chloride: 108 mEq/L (ref 100–111)
Creatinine: 0.7 mg/dL (ref 0.6–1.0)
Glucose: 105 mg/dL — ABNORMAL HIGH (ref 70–100)
Potassium: 4.1 mEq/L (ref 3.5–5.1)
Sodium: 137 mEq/L (ref 136–145)

## 2016-05-25 LAB — PT/INR
PT INR: 1.1 (ref 0.9–1.1)
PT: 14 s (ref 12.6–15.0)

## 2016-05-25 SURGERY — PM IMPLANT SINGLE
Anesthesia: Anesthesia General

## 2016-05-25 MED ORDER — PROPOFOL 10 MG/ML IV EMUL (WRAP)
INTRAVENOUS | Status: AC
Start: 2016-05-25 — End: ?
  Filled 2016-05-25: qty 20

## 2016-05-25 MED ORDER — HYDROMORPHONE HCL 1 MG/ML IJ SOLN
0.2000 mg | INTRAMUSCULAR | Status: DC | PRN
Start: 2016-05-25 — End: 2016-05-27

## 2016-05-25 MED ORDER — LIDOCAINE HCL (PF) 1 % IJ SOLN
INTRAMUSCULAR | Status: AC
Start: 2016-05-25 — End: 2016-05-25
  Administered 2016-05-25: 10 mL via SUBCUTANEOUS
  Filled 2016-05-25: qty 30

## 2016-05-25 MED ORDER — FENTANYL CITRATE (PF) 50 MCG/ML IJ SOLN (WRAP)
25.0000 ug | INTRAMUSCULAR | Status: DC | PRN
Start: 2016-05-25 — End: 2016-05-27
  Administered 2016-05-25: 25 ug via INTRAVENOUS
  Filled 2016-05-25: qty 2

## 2016-05-25 MED ORDER — ONDANSETRON HCL 4 MG/2ML IJ SOLN
4.0000 mg | Freq: Once | INTRAMUSCULAR | Status: DC | PRN
Start: 2016-05-25 — End: 2016-05-27

## 2016-05-25 MED ORDER — PROPOFOL INFUSION 10 MG/ML
INTRAVENOUS | Status: DC | PRN
Start: 2016-05-25 — End: 2016-05-25
  Administered 2016-05-25: 50 ug/kg/min via INTRAVENOUS

## 2016-05-25 MED ORDER — PROPOFOL 10 MG/ML IV EMUL (WRAP)
INTRAVENOUS | Status: AC
Start: 2016-05-25 — End: ?
  Filled 2016-05-25: qty 50

## 2016-05-25 MED ORDER — CEFUROXIME SODIUM 1.5 G IJ/IV SOLR (WRAP)
Status: AC
Start: 2016-05-25 — End: ?
  Filled 2016-05-25: qty 1500

## 2016-05-25 MED ORDER — METOCLOPRAMIDE HCL 5 MG/ML IJ SOLN
10.0000 mg | Freq: Once | INTRAMUSCULAR | Status: DC | PRN
Start: 2016-05-25 — End: 2016-05-27

## 2016-05-25 MED ORDER — ENOXAPARIN SODIUM 40 MG/0.4ML SC SOLN
40.0000 mg | Freq: Every day | SUBCUTANEOUS | Status: DC
Start: 2016-05-26 — End: 2016-05-25

## 2016-05-25 MED ORDER — CEFAZOLIN SODIUM-DEXTROSE 1-4 GM-% IV SOLR
1.0000 g | Freq: Three times a day (TID) | INTRAVENOUS | Status: AC
Start: 2016-05-26 — End: 2016-05-26
  Administered 2016-05-25 – 2016-05-26 (×2): 1 g via INTRAVENOUS
  Filled 2016-05-25 (×2): qty 50

## 2016-05-25 MED ORDER — BACITRACIN 50000 UNITS IM SOLR
INTRAMUSCULAR | Status: AC
Start: 2016-05-25 — End: 2016-05-25
  Filled 2016-05-25: qty 50000

## 2016-05-25 MED ORDER — LACTATED RINGERS IV SOLN
INTRAVENOUS | Status: DC | PRN
Start: 2016-05-25 — End: 2016-05-25

## 2016-05-25 MED ORDER — CEFUROXIME SODIUM 1.5 G IJ/IV SOLR (WRAP)
Status: DC | PRN
Start: 2016-05-25 — End: 2016-05-25
  Administered 2016-05-25: 1.5 g via INTRAVENOUS

## 2016-05-25 MED ORDER — OXYCODONE-ACETAMINOPHEN 5-325 MG PO TABS
1.0000 | ORAL_TABLET | Freq: Once | ORAL | Status: AC | PRN
Start: 2016-05-25 — End: 2016-05-26
  Administered 2016-05-26: 1 via ORAL
  Filled 2016-05-25: qty 1

## 2016-05-25 NOTE — Discharge Summary (Addendum)
Discharge Summary    Date:05/25/2016   Patient Name: [name],[name]  Attending Physician: Baker Pierini, MD    Date of Admission:   05/18/2016    Date of Discharge:   05/27/2016    Admitting Diagnosis:   VT   Fall  Hip fracture    Discharge Dx:     Principal Diagnosis (Diagnosis after study, that is chiefly responsible for admission to inpatient status): VT  Active Hospital Problems    Diagnosis POA   . Closed pertrochanteric fracture of femur, left, initial encounter Unknown   . Ischemic embolic stroke Yes   . Decreased diffusion capacity Yes   . PFO (patent foramen ovale) Not Applicable   . Acquired hypothyroidism Yes   . Chronic systolic heart failure Yes   . PAH (pulmonary artery hypertension) Yes   . Non-rheumatic mitral regurgitation Yes   . Respiratory failure Yes   . Ischemic stroke Yes   . Altered mental status, unspecified altered mental status type Unknown   . CHF (congestive heart failure) Yes   . Atrial fibrillation Yes      Resolved Hospital Problems    Diagnosis POA   No resolved problems to display.       Treatment Team:   Treatment Team:   Attending Provider: Baker Pierini, MD  Consulting Physician: Shawnee Knapp, MD     Procedures performed:   Radiology: all results from this admission  Ct Angiogram Head Neck    Result Date: 05/19/2016   1. The major arterial vessels of the neck and intracranial circulation are widely patent as discussed in detail above. 2. No intracranial hemorrhage is seen. 3. Given the symptoms  , followup MRI of the brain without and with contrast material could be obtained. Theodoro Doing, MD 05/19/2016 9:42 AM     Xr Femur Left Ap And Lateral    Result Date: 05/20/2016   Expected immediate postoperative appearance of the left hip and femur. Al Decant, MD 05/20/2016 9:37 PM     Xr Femur Left Ap And Lateral    Result Date: 05/19/2016  Acute intratrochanteric fracture of the left femur. The remainder is as above. Stephannie Peters, MD 05/19/2016 8:00 PM      Ct Head Without Contrast    Result Date: 05/18/2016  No acute intracranial abnormality. If continued clinical concern for acute stroke, MRI is more sensitive. These findings were discussed with Dr. Arlana Lindau at 05/18/2016 11:40 PM. Johnsie Kindred, MD 05/18/2016 11:40 PM     Ct Head W Contrast    Result Date: 05/19/2016   1. The major arterial vessels of the neck and intracranial circulation are widely patent as discussed in detail above. 2. No intracranial hemorrhage is seen. 3. Given the symptoms  , followup MRI of the brain without and with contrast material could be obtained. Theodoro Doing, MD 05/19/2016 9:42 AM     Xr Hip Left 2-3 Vw With Pelvis    Result Date: 05/20/2016   Expected immediate postoperative appearance of the left hip and femur. Al Decant, MD 05/20/2016 9:37 PM     Xr Hip Left 2-3 Vw With Pelvis    Result Date: 05/19/2016  Acute intratrochanteric fracture of the left femur. Findings were discussed with and acknowledged by the nurse Katy at 5:46 PM on 05/19/2016. Stephannie Peters, MD 05/19/2016 5:47 PM     Fl Fluoro > 1 Hour    Result Date: 05/20/2016   Surgery under  fluoroscopic guidance. Al Decant, MD 05/20/2016 7:01 PM     Xr Chest Ap Portable    Result Date: 05/25/2016   New left-sided ICD and leads as noted. Left basilar atelectasis and obscuration of both costophrenic angles. No pneumothorax. Prince Solian, MD 05/25/2016 6:13 PM     Xr Chest Ap Portable    Result Date: 05/22/2016  Mild basilar atelectasis. No pulmonary vascular congestion. Clide Cliff, MD 05/22/2016 4:51 PM     Chest Ap Portable    Result Date: 05/18/2016  No acute abnormality. Nonacute findings detailed above. Johnsie Kindred, MD 05/18/2016 11:56 PM     US Venous Duplex Doppler Leg Left    Result Date: 05/23/2016   No evidence of deep vein thrombosis involving the left lower extremity. Annabell Sabal, MD 05/23/2016 10:03 AM     US Venous Low Extrem Duplx Dopp Comp Bilat    Result Date:  05/19/2016   No sonographic evidence for right or left lower extremity deep venous thrombosis. Al Decant, MD 05/19/2016 1:54 PM     Surgery: all results from this admission  Procedure(s) with comments:  PM Implant Single (N/A) - [facility]    Reason for Admission:   [facility] Course:   From H&P:    RHYLEN PULIDO is a 82 Young.o. female provided by husband. They were watching a tv program together and Mrs. Dement went to another room. about 30-40 minutes later, Mr Funderburg went to check on her lying and found on the bathroom floor. "she was unconscious and would not answer", "I know she was breathing, I could see her breathing" he did not notice any spontaneous movement. EMS was called. earlier in the day she had one episode severe vomiting and decreased appetite throughout the day. She was in normal state of health prior to this. INR 3.7. Cardioverted times 2 en route for V-tach. May have been a-fib with RVR down BB. Or left ant hemiblock. Intubated for airway protection and to safely facilitate additional work-up. Called by ED, patient evaluated by stroke team. No TPA due to elevated INR, CTA complete and being reviewed to evaluate for large vessel occlusion at time of call. Known PFO see cardiac cath below under radiology.    From hospital course:    1. Torsades/VT: patient underwent resuscitation per EMS as noted above. She had previously been on Tikosyn for atrial fibrillation which was subsequently held. Ms. Wehrenberg was initiated on amiodarone but given her underlying disease, this was held as well. She underwent ICD placement uneventfully on 05/25/16. Medically, patient was started on carvedilol prior to discharge.     2. L femur fracture from syncope associated with VT: patient underwent closed reduction of left him with insertion of nail on 05/20/16 with Dr. Chevis Pretty.     3. Atrial fibrillation: patient was on coumadin prior to admission with stable dose of 5mg . This was held in setting of need  for ortho femur fracture repair. It was restarted prior to discharge. Because of recent ICD placement, Lovenox was withheld briefly. Instruction for SNF at time of discharge are as follows: Patient needs daily INR checks. Goal INR is between 2 and 3. If she is not at goal by 05/29/16, she should be started on Lovenox 40mg  SQ QDay until her INR is within goal. Once her INR is at goal, Lovenox should be stopped.  As home Tikosyn was stopped as it can be associated with arrhythmias.  4. Anemia: post-op anemia with 1u pRBC given.        Condition at Discharge:   stable  Today:     BP 146/67   Pulse 75   Temp 97.5 F (36.4 C) (Oral)   Resp 18   Ht 1.626 m (5' 4.02")   Wt 66.5 kg (146 lb 9.7 oz)   SpO2 98%   BMI 25.15 kg/m   Ranges for the last 24 hours:  Temp:  [96.1 F (35.6 C)-98.1 F (36.7 C)] 97.5 F (36.4 C)  Heart Rate:  [60-78] 75  Resp Rate:  [16-20] 18  BP: (91-154)/(57-85) 146/67    Last set of labs     Recent Labs  Lab 05/25/16  0227   WBC 7.56   Hgb 8.7*   Hematocrit 27.0*   Platelets 149       Micro / Labs / Path pending:     Unresulted Labs     Procedure . . . Date/Time    PM Implant Single [[id number]] Resulted:  05/25/16 1533     Updated:  05/25/16 1737    Transfuse 1 unit RBC [[id number]] Resulted:  05/22/16 0607     Updated:  05/22/16 0607    Hemoglobin and hematocrit, blood [[id number]] Collected:  05/21/16 0254    Specimen:  Blood Updated:  05/21/16 0252    Narrative:       -Post op day one            Stroke Measures:  Patient does not meet a need for further explanation of Reasons of treatment.  Heart Failure:  Patient does not meet a need for further explanation of Reasons of treatment.    Discharge Instructions:       Discharge Diet: Cardiac Diet  Incision Site 05/20/16 Hip Left (Active)   Site Description Intact 05/25/2016  8:15 PM   Peri-wound Description UTA 05/25/2016  8:15 PM   Closure Staples 05/24/2016  8:00 AM   Drainage Amount Moderate 05/25/2016  8:15 PM   Drainage Description  Other (Comment) 05/25/2016  8:15 PM   Treatments Site care 05/21/2016  6:00 AM   Dressing Gauze;Transparent Film 05/25/2016  8:15 PM   Dressing Changed Changed 05/24/2016  8:00 AM   Dressing Status Old drainage 05/25/2016  8:15 PM   Number of days: 5       Incision Site 05/25/16 Chest Left (Active)   Site Description Clean;Dry;Intact 05/25/2016  8:15 PM   Closure Adhesive bandage 05/25/2016  8:15 PM   Drainage Amount None 05/25/2016  8:15 PM   Dressing Status Clean;Dry;Intact 05/25/2016  8:15 PM   Number of days: 0        Peripheral IV 05/21/16 Right Forearm (Active)   Site Assessment Clean;Dry;Intact 05/25/2016  8:00 PM   Line Status Saline Locked 05/25/2016  8:00 PM   Tubing Dated? Yes 05/22/2016  8:30 PM   Dressing Status Clean;Dry;Intact 05/25/2016  8:00 PM   Dressing Dated? Yes 05/25/2016  8:00 PM   Dressing Change Due 05/28/16 05/25/2016  8:00 PM   Reason Not Rotated Not due 05/25/2016  8:00 PM   Number of days: 4       Peripheral IV 05/25/16 Left Wrist (Active)   Site Assessment Intact;Dry;Clean 05/25/2016  8:00 PM   Line Status Saline Locked 05/25/2016  8:00 PM   Dressing Status Clean;Dry;Intact 05/25/2016  8:00 PM   Dressing Change Due 06/01/16 05/25/2016  8:00 PM   Reason Not Rotated Not due 05/25/2016  8:00 PM  Number of days: 0     Disposition:  Home or Self Care     Discharge Medication List      Taking    acetaminophen 500 MG tablet  Dose:  500 mg  What changed:   when to take this   reasons to take this  Commonly known as:  TYLENOL  Take 1 tablet (500 mg total) by mouth every 6 (six) hours as needed for Pain.     alendronate 70 MG tablet  Dose:  70 mg  Commonly known as:  FOSAMAX  Take 70 mg by mouth every 7 days.Take 1 tab by mouth every 7 days with a full glass of water on an empty stomach. Do not take anything else by mouth or lie down for 30 mins.     ALIGN 4 MG Caps  Take by mouth.Take as directed     ARNUITY ELLIPTA 100 MCG/ACT Aepb  Dose:  1 puff  Generic drug:  fluticasone furoate  Inhale 1 puff into the lungs  daily.     carvedilol 12.5 MG tablet  Dose:  12.5 mg  Commonly known as:  COREG  Take 1 tablet (12.5 mg total) by mouth every 12 (twelve) hours.     CITRACAL + D PO  Dose:  2 tablet  Take 2 tablets by mouth daily.     CO-ENZYME Q-10 PO  Take by mouth daily.     furosemide 20 MG tablet  Dose:  20 mg  Commonly known as:  LASIX  Take 20 mg by mouth daily.     gabapentin 100 MG capsule  Dose:  100 mg  Commonly known as:  NEURONTIN  Take 100 mg by mouth 3 (three) times daily.     levothyroxine 112 MCG tablet  Dose:  112 mcg  Commonly known as:  SYNTHROID, LEVOTHROID  Take 112 mcg by mouth Once a day at 6:00am.     lisinopril 5 MG tablet  Dose:  5 mg  Commonly known as:  PRINIVIL,ZESTRIL  Take 5 mg by mouth daily.     Magnesium 250 MG Tabs  Dose:  250 mg  Take 250 mg by mouth daily.     multivitamin tablet  Dose:  1 tablet  Take 1 tablet by mouth daily.     simvastatin 10 MG tablet  Dose:  10 mg  Commonly known as:  ZOCOR  Take 10 mg by mouth nightly.     VITRON-C 65-125 MG Tabs  Dose:  1 tablet  Generic drug:  iron-vitamin C  Take 1 tablet by mouth every tues and thurs.     warfarin 5 MG tablet  Dose:  5 mg  Commonly known as:  COUMADIN  Take 5 mg by mouth daily.        STOP taking these medications    dofetilide 250 MCG capsule  Commonly known as:  TIKOSYN          Minutes spent coordinating discharge and reviewing discharge plan: 50 minutes      Signed by: Janalee Dane, MD

## 2016-05-25 NOTE — Plan of Care (Signed)
H&P Update with ASA/MALLAMPATTI     Date Time: 05/25/16 3:57 PM    PROCEDURE   ICD implantation    CONCLUSION:     The history and physical currently available in the chart has been reviewed and there are no significant interval changes since prior evaluation.  She has no complaints.  She was seen and examined by me prior to the procedure.  The risks, benefits and alternatives of the procedure have been discussed in detail and she has indicated that she understands the procedure, indications, and risks inherent to the procedure and is amenable to proceeding.  All questions were answered. Informed consent was signed and verified.        Signed by: Thurnell Lose, MD

## 2016-05-25 NOTE — Anesthesia Postprocedure Evaluation (Signed)
Anesthesia Post Evaluation    Patient: [name]    Procedure(s) with comments:  PM Implant Single - [facility]    Anesthesia type: General LMA    Last Vitals:   Vitals:    05/25/16 1600   BP: 125/58   Pulse: 73   Resp: 20   Temp: 36.6 C (97.9 F)   SpO2: 92%       Patient Location: Phase I PACU      Post Pain: Patient not complaining of pain, continue current therapy    Mental Status: awake    Respiratory Function: tolerating room air    Cardiovascular: stable    Nausea/Vomiting: patient not complaining of nausea or vomiting    Hydration Status: adequate    Post Assessment: no apparent anesthetic complications, no reportable events and no evidence of recall          Anesthesia Qualified Clinical Data Registry    Central Line      CVC insertion : NO                                               Perioperative temperature management      General/neuraxial anesthesia > or = 60 minutes (excluding CABG) : YES              > Use of intraoperative active warming : YES              > Temperature > or = 36 degrees Centigrade (96.8 degrees Farenheit) during time span from 30 minutes before up to 15 minutes after anesthesia end time : YES      Administration of antibiotic prophylaxis      Age > or = 18, with IV access, with surgical procedure for which antibiotic prophylaxis indicated, and not on chronic antibiotics : YES              > Prophylactic antibiotics within 1 hour of incision (or fluroroquinolone/vancomycin within 2 hours of incision) : YES    Medication Administration      Ordering or administration of drug inconsistent with intended drug, dose, delivery or timing : NO      Dental loss/damage      Dental injury with administration of anesthesia : NO      Difficult intubation due to unrecognized difficult airway        Elective airway procedure including but not limited to: tracheostomy, fiberoptic bronchoscopy, rigid bronchoscopy; jet ventilation; or elective use of a device to facilitate airway management  such as a Glidescope : NO                > Unanticipated difficult intubation post pre-evaluation : NO      Aspiration of gastric contents        Aspiration of gastric contents : NO                    Surgical fire        Procedure requiring electrocautery/laser : YES                > Ignition/burning in invasive procedure location : NO      Immediate perioperative cardiac arrest        Cardiac arrest in OR or PACU : NO  Unplanned hospital admission        Unplanned hospital admission for initially intended outpatient anesthesia service : NO      Unplanned ICU admission        Unplanned ICU admission related to anesthesia occurring within 24 hours of induction or start of MAC : NO      Surgical case cancellation        Cancellation of procedure after care already started by anesthesia care team : NO      Post-anesthesia transfer of care checklist/protocol to PACU        Transfer from OR to PACU upon case conclusion : YES              > Use of PACU transfer checklist/protocol : YES     (Includes the key elements of: patient identification, responsible practitioner identification (PACU nurse or advanced practitioner), discussion of pertinent history and procedure course, intraoperative anesthetic management, post-procedure plans, acknowledgement/questions)    Post-anesthesia transfer of care checklist/protocol to ICU        Transfer from OR to ICU upon case conclusion : NO                    Post-operative nausea/vomiting risk protocol        Post-operative nausea/vomiting risk protocol : YES  Patient > or = 18 with care initiated by anesthesia team that has a risk factor screen for post-op nausea/vomiting (Includes female, hx PONV, or motion sickness, non-smoker, intended opioid administration for post-op analgesia.)    Anaphylaxis        Anaphylaxis during anesthesia services : NO    (Inclusive of any suspected transfusion reaction in association with blood-bank confirmed blood product  incompatibility)              Signed by: Leilani Able, 05/25/2016 5:44 PM
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
## 2016-05-25 NOTE — Plan of Care (Addendum)
Problem: Safety  Goal: Patient will be free from injury during hospitalization  Outcome: Progressing   05/25/16 1041   Goal/Interventions addressed this shift   Patient will be free from injury during hospitalization  Assess patient's risk for falls and implement fall prevention plan of care per policy;Use appropriate transfer methods;Ensure appropriate safety devices are available at the bedside;Assess for patients risk for elopement and implement Elopement Risk Plan per policy;Hourly rounding       Problem: Pain  Goal: Pain at adequate level as identified by patient  Outcome: Progressing   05/25/16 1041   Goal/Interventions addressed this shift   Pain at adequate level as identified by patient Identify patient comfort function goal;Assess pain on admission, during daily assessment and/or before any "as needed" intervention(s);Evaluate if patient comfort function goal is met;Offer non-pharmacological pain management interventions       Problem: Every Day - Stroke  Goal: Neurological status is stable or improving  Outcome: Progressing   05/25/16 1041   Goal/Interventions addressed this shift   Neurological status is stable or improving Monitor/assess/document neurological assessment (Stroke: every 4 hours);Perform CAM Assessment     Goal: Mobility/Activity is maintained at optimal level for patient  Outcome: Progressing   05/25/16 1041   Goal/Interventions addressed this shift   Mobility/activity is maintained at optimal level for patient Increase mobility as tolerated/progressive mobility;Maintain proper body alignment;Reposition patient every 2 hours and as needed unless able to reposition self     Goal: Skin integrity is maintained or improved  Outcome: Progressing   05/25/16 1041   Goal/Interventions addressed this shift   Skin integrity is maintained or improved Assess Braden Scale every shift;Turn or reposition patient every 2 hours or as needed unless able to reposition self;Increase activity as  tolerated/progressive mobility;Relieve pressure to bony prominences;Avoid shearing;Keep skin clean and dry       Problem: Impaired Mobility  Goal: Mobility/Activity is maintained at optimal level for patient  Outcome: Progressing   05/25/16 1041   Goal/Interventions addressed this shift   Mobility/activity is maintained at optimal level for patient Increase mobility as tolerated/progressive mobility;Maintain proper body alignment;Reposition patient every 2 hours and as needed unless able to reposition self       Problem: Compromised skin integrity  Goal: Skin integrity is maintained or improved  Outcome: Progressing   05/25/16 1041   Goal/Interventions addressed this shift   Skin integrity is maintained or improved Assess Braden Scale every shift;Turn or reposition patient every 2 hours or as needed unless able to reposition self;Increase activity as tolerated/progressive mobility;Relieve pressure to bony prominences;Avoid shearing;Keep skin clean and dry       Problem: Neurological Deficit  Goal: Neurological status is stable or improving  Outcome: Progressing   05/25/16 1041   Goal/Interventions addressed this shift   Neurological status is stable or improving Monitor/assess/document neurological assessment (Stroke: every 4 hours);Perform CAM Assessment       Neuro status stable. A/Ox4, limited movement in LLE d/t to hip fracture. Follows commands, speech clear. On room air, no complaints of SOB. Pt c/o of pain neck, PRN tylenol given x2 with some relief. Pt NPO for ICD placement picked up for procedure at 1530, medications given with sips per order. Hip dressings changed x2. Husband present at bedside prior to procedure. Safety measures in place, will continue to monitor.

## 2016-05-25 NOTE — UM Notes (Signed)
CSR for 05/25/16    Admit for hip fx,, VT/torsades    POD #5  IM Nail    ICD to be placed today    Dispo plan is snf  Family making visits.   Should have decision today as to choice.    Ortho note: snf when medically stable. No further Ortho plans    Cardiology note:   Recommendations:    Resume pre-admit lisinopril for cardiomyopathy as bp will allow   ICD today.      Leafy Half RN BSN  Utilization Review Case Manager  [employer]  [phone]

## 2016-05-25 NOTE — Progress Notes (Signed)
Received report and patient into ICAR 36 s/p dual ICD implant. Assumed patient's care. Bedside handoff completed with RCIS and anesthesia, ID band verified, alert and oriented x 3, VSS, aldrete 10, pain 0 /10, rhythm. Pt on room air. Call bell within reach. Oriented pt to to room. Hourly rounding implemented. Site Checks/Vitals/Pain assessment/Pulses checked per protocol. Educated patient on site precautions including but not limited to: left arm precautions, NPO status until CXR resulted, arm sling, ROM precautions, etc. Pt verbalized understanding of all education.

## 2016-05-25 NOTE — PT Progress Note (Signed)
Physical Therapy Note    [facility]  [address]  [place] [address]  [phone]    Patient:  [name] MRN#:  [id number]  Unit:  [facility] EAST Room/Bed:  J188/C166.06    Time of treatment:  Start Time: 1415   Stop Time: 1443   Time Calculation (min): 28 min  Treatment # 1    Precautions and Contraindications:    Weight Bearing Status: LLE WBAT  Other Precautions: Falls, awaiting ICD placement,      Subjective:   PT agreeable to therapy, however nervous about pending procedure.    Pain Assessment  Pain Assessment: Numeric Scale (0-10)  Pain Score: 8-severe pain  Pain Location: Hip  Pain Orientation: Left  Pain Descriptors: Aching  Pain Frequency: Increases with movement  Patient's Stated Comfort Functional Goal: 2-mild pain  Pain Intervention(s): Medication (See eMAR) (call RN for pain meds).  Patient's medical condition is appropriate for Physical Therapy intervention at this time.  Patient is agreeable to participation in the therapy session.    Objective:  Observation of Patient/Vital Signs:  Patient is in bed with SCD's in place, resting in bed.  Awaiting transport for placement of AICD.           Functional Mobility  Functional Mobility Deferred (Comment): Pt going for ICD placement shortly.  Bed therex only this visit per nursing,  ,           Therapeutic Exercise  Glute Sets: 10  Ankle Pumps: 10   Hip abd: AAROM L 10  Heel slides: AAROM L 10  QS with avoidance of valsalva 10 sec holds 1 x 10.  SAQ:  AAROM L with 5 sec holds 1 x 10.      Educated the patient to role of physical therapy, plan of care, goals of therapy and HEP.    Assessment:   Pt resting in bed awaiting procedure.  Nursing asked for no OOB prior to procedure of AICD placement.  Pt agreeable for supine therex.     Patient left without needs and call bell within reach. RN notified of session outcome.    Plan:  Plan  Risks/Benefits/POC Discussed with Pt/Family: With  patient/family  Treatment/Interventions: Exercise  PT Frequency: 3-4x/wk.   Continue plan of care.    Riccardo Dubin, MSPT, pager # [phone], 05/25/2016, 3:15 PM

## 2016-05-25 NOTE — Progress Notes (Signed)
NEUROLOGY PROGRESS NOTE    Date Time: 05/25/16 8:54 AM  Patient Name: TeresaTeresa Young  Attending Physician: Shawnee Knapp, MD    BRIEF HISTORY:   REASON FOR CONSULTATION:  Unwitnessed fall, altered mental status, rule out stroke.     HISTORY OF PRESENT ILLNESS:  The patient is an 82 year old woman with history of prior stroke, mitral  regurgitation, atrial fibrillation on Coumadin, congestive heart failure  who comes into the hospital with unwitnessed fall, left-sided weakness,  concerned about stroke.  The patient is currently intubated but alert.   History is mostly from medical records.  The patient was watching TV with  her husband last night when she went to another room; 30 to 40 minutes  later husband went to check on her and found her lying on the floor.  She  was unconscious and would not answer.  EMS was called.  The patient was  noted to have wide complex tachycardia en route.  This was suspected as  ventricular tachycardia.  She was cardioverted x2.  The patient in the ED  had NIH stroke scale of 21.  CT brain scan negative for any bleed, no TPA  was administered due to her INR of 3.7.  The patient currently is more  awake, responsive, denies pain.  She is intubated in the ICU.         Chief Complaints:   Alert, dry mouth better  Ate breakfast  Left leg pain  Post surgery  Left hip fracture  EP eval for arrhythmia       Interval History/24 hour events:   Denies headache or confusion  Likely syncope from cardiac arrhythmia caused fall with left hip fracture  MRI cancelled as low probability of stroke  Overall much better  No headache or confusion       Review Of Systems:  No headaches, dizziness, confusion, focal weakness or numbness  No deafness, tinnitus, blurred vision, or vision loss, No double vision or droopy lids  No speech or swallowing problems  Balance poor,+ Falls  No bowel or bladder incontinence  No fever or chills        Physical Exam:     Vitals:    05/25/16 0751   BP: 125/71   Pulse:  78   Resp:    Temp:    SpO2:        Intake and Output Summary (Last 24 hours) at Date Time  No intake or output data in the 24 hours ending 05/25/16 0854    General: No apparent distress, comfortable appearing, vital signs noted above  Head: Mild bruise right temple and normocephalic  Eyes: Anicteric sclerae, moist conjunctiva, PERRLA   ENT: Oropharynx clear; no erythema or exudates;   Neck: supple; FROM; no mass   Extremities: No clubbing, cyanosis or edema, left hip fracture  CV: Regular rate and rhythm, no murmurs  Lungs: clear  Skin: No rash, lesions, ulcers   Neurological Examination  Mental Status:   Alert, oriented, to time, place, person and situation, confused about surgery   Clear speech, normal language, including fluency, naming,     Normal short term and remote memory.  Adequate knowledge.   Affect appropriate   CN 2-12    EOM full, PERL, Normal fields and fundi, Face symmetric, tongue midline with normal bulk and no fasicualtions  Hearing, facial sensations and Gag reflex intact   Motor system: Normal tone, bulk, no involuntary movements.     Power 4/5.  No cog  wheeling, tremors, or bradykinesia.   Sensory system: Normal touch, pin prick vibration in all 4 extremities; no sensory level   Reflexes:  Deep tendon reflexes symmetric, Negative Babinski  Cerebellar:  Mild tremor  Gait: Not tested  Neurovascular: No bruits.  Signs of meningeal irritation: None        Other pertinent positive findings:       Meds:     Current Facility-Administered Medications   Medication Dose Route Frequency   . calcium citrate-Vitamin D  2 tablet Oral Daily   . carvedilol  6.25 mg Oral Q12H SCH   . famotidine  20 mg Oral Daily    Or   . famotidine  20 mg Intravenous Daily   . fluticasone-salmeterol  2 puff Inhalation BID   . gabapentin  100 mg Oral TID   . levothyroxine  112 mcg Oral Daily at 0600   . multivitamin-minerals-folic acid-coenzyme q10  1 tablet per OG tube Daily   . senna-docusate  2 tablet Oral BID   .  simvastatin  10 mg Oral QHS       Labs:     Results     Procedure Component Value Units Date/Time    Prothrombin time/INR [[id number]] Collected:  05/25/16 0227    Specimen:  Blood Updated:  05/25/16 0344     PT 14.0 sec      PT INR 1.1     PT Anticoag. Given Within 48 hrs. warfarin ([place]    Basic Metabolic Panel [[id number]]  (Abnormal) Collected:  05/25/16 0228    Specimen:  Blood Updated:  05/25/16 0338     Glucose 105 (H) mg/dL      BUN 78.2 mg/dL      Creatinine 0.7 mg/dL      Calcium 8.0 mg/dL      Sodium 956 mEq/L      Potassium 4.1 mEq/L      Chloride 108 mEq/L      CO2 21 (L) mEq/L     GFR [[id number]] Collected:  05/25/16 0228     Updated:  05/25/16 0338     EGFR >60.0    CBC and differential [[id number]]  (Abnormal) Collected:  05/25/16 0227    Specimen:  Blood from Blood Updated:  05/25/16 0305     WBC 7.56 x10 3/uL      Hgb 8.7 (L) g/dL      Hematocrit 41.3 (L) %      Platelets 149 x10 3/uL      RBC 2.75 (L) x10 6/uL      MCV 98.2 fL      MCH 31.6 pg      MCHC 32.2 g/dL      RDW 15 %      MPV 10.6 fL      Nucleated RBC 1.5 (H) /100 WBC      Absolute NRBC 0.11 (H) x10 3/uL      Neutrophils 66.0 %      Lymphocytes Automated 20.8 %      Monocytes 10.4 %      Eosinophils Automated 2.0 %      Basophils Automated 0.1 %      Immature Granulocyte 0.7 %      Neutrophils Absolute 4.99 x10 3/uL      Abs Lymph Automated 1.57 x10 3/uL      Abs Mono Automated 0.79 x10 3/uL      Abs Eos Automated 0.15 x10 3/uL      Absolute Baso Automated 0.01  x10 3/uL      Absolute Immature Granulocyte 0.05 x10 3/uL             Imaging personally reviewed, including:     Radiology Results (24 Hour)     ** No results found for the last 24 hours. **            Assessment:     Patient Active Problem List   Diagnosis   . Celiac disease   . Scoliosis   . Atrial fibrillation   . Mitral valve disorder   . Disease of tricuspid valve   . Chronic airway obstruction, not elsewhere classified   . AF (atrial fibrillation)   . Paroxysmal a-fib   .  CHF (congestive heart failure)   . Ischemic embolic stroke   . Decreased diffusion capacity   . PFO (patent foramen ovale)   . Acquired hypothyroidism   . Chronic systolic heart failure   . PAH (pulmonary artery hypertension)   . Non-rheumatic mitral regurgitation   . Respiratory failure   . Ischemic stroke   . Altered mental status, unspecified altered mental status type   . Closed pertrochanteric fracture of femur, left, initial encounter     Fall  Syncope, cardiac arrhythmia   Left hip fracture  Coagulopathy      Plan/Recommendations:   General measures:  Diet/activity/Precuations: Per routine from admitting orders  Symptomatic measures: as needed  Diagnostics:Reviewed CT/CTA, loop recorder planned  Medications:Noted  Consultation:Ortho for hip surgery, EP for cardiac issues  Physical/Occupational/Speech Therapy:As needed  Patient/Family Education/Counseling:Provided        Anticipated discharge disposition and date: Unknown     Case discussed with: Nursing/Team        Signed by: Shawnee Knapp, MD

## 2016-05-25 NOTE — Progress Notes (Signed)
Inpatient transferred to [facility] room 9 to pre-op for ICD implant by Dr. Amado Coe. Verified pt, ID band, and procedure. NPO status. VSS. Call bell in reach. Accompanied by husband who will be waiting in [facility] waiting area.

## 2016-05-25 NOTE — OT Progress Note (Signed)
[facility]   Occupational Therapy Cancellation Note      Patient:  [name] MRN#:  [id number]  Unit:  [facility] TOWER 6 EAST Room/Bed:  W119/J478.29    05/25/2016  Time: 1530      Patient not seen for occupational therapy secondary to RN deferred as pt heading for ICD placement soon. Will f/u as appropriate.        Genia Del, OTR/L  [phone]

## 2016-05-25 NOTE — Anesthesia Preprocedure Evaluation (Signed)
Anesthesia Evaluation    AIRWAY    Mallampati: II    TM distance: >3 FB  Neck ROM: full  Mouth Opening:full   CARDIOVASCULAR    cardiovascular exam normal, regular and normal       DENTAL    no notable dental hx     PULMONARY    pulmonary exam normal and clear to auscultation     OTHER FINDINGS              Relevant Problems   (+) Acquired hypothyroidism   (+) Atrial fibrillation   (+) CHF (congestive heart failure)   (+) Chronic airway obstruction, not elsewhere classified   (+) Ischemic embolic stroke   (+) Ischemic stroke   (+) Non-rheumatic mitral regurgitation               Anesthesia Plan    ASA 3     general                     intravenous induction   Detailed anesthesia plan: general LMA and general IV            informed consent obtained    Plan discussed with CRNA.    ECG reviewed  pertinent labs reviewed           ===============================================================  Inpatient Anesthesia Evaluation    Patient Name: [name],[name]  Surgeon: Thurnell Lose, MD  Patient Age / Sex: 82 Young.o. / female    Medical History:     Past Medical History:   Diagnosis Date   . Abnormal vision     Dry eyes; marginal vision L eye   . Abnormal vision     Wears glasses   . Anemia     controlled on meds   . Aortic valve insufficiency    . Arrhythmia     Atrial fibrillation   . Arthritis     osteoarthritis, back and pelvis   . AVD (aortic valve disease)    . Benign heart murmur    . Bilateral cataracts     surgery, right eye   . Celiac disease    . Chronic obstructive pulmonary disease     mild-mod   . Difficulty walking     Back pain;    . Disease of lung    . Disorder of musculoskeletal system ?    ?   Marland Kitchen Dyspnea on exertion    . Ear, nose and throat disorder     Hoarseness   . Encounter for cardioversion procedure 10/31/2013   . Gastroesophageal reflux disease    . GERD (gastroesophageal reflux disease)     occasional , asymptomatic   . Headache     infrequently   . Heart murmur    . Hyperlipidemia     controlled  with meds   . Hypertension     controlled w/meds   . Hypothyroidism     controlled with meds   . Low back pain    . Mitral valve insufficiency    . Mitral valve prolapse    . Orthopedic aftercare     scoliosis, stenosis, athritis   . Osteoporosis    . Pneumonia     had pneumonia shot 2010 and 2015   . Primary cardiomyopathy    . Scoliosis    . Shortness of breath    . Thyroid disease    . Vein disorder     bilateral legs  Past Surgical History:   Procedure Laterality Date   . ADENOIDECTOMY      as a child   . APPENDECTOMY      as a child   . CARDIAC CATHETERIZATION  2007   . CARDIAC CATHETERIZATION  2014   . CARDIOVERSION      x3 for A Fib   . CATARACT EXTRACTION W/ INTRAOCULAR LENS IMPLANT Right 2001   . CLOSED REDUCTION, INSERTION, HIP IM NAIL (GAMMA) Left 05/20/2016    Procedure: CLOSED REDUCTION, INSERTION, HIP IM NAIL (GAMMA);  Surgeon: Timoteo Ace, MD;  Location: [facility];  Service: Orthopedics;  Laterality: Left;   . COLONOSCOPY     . EYE SURGERY      Cataract R eye, almost no focusand vision in left eye   . JOINT REPLACEMENT Right 10-15-15    TJR,  No Complications   . KNEE SURGERY Left 2002   . TONSILLECTOMY     . TONSILLECTOMY AND ADENOIDECTOMY           Allergies:     Allergies   Allergen Reactions   . Erythromycin Hives   . Gluten Meal Other (See Comments)     Has Celiac disease         Medications:     Current Facility-Administered Medications   Medication Dose Route Frequency Last Rate Last Dose   . 0.9%  NaCl infusion   Intravenous PRN       . acetaminophen (TYLENOL) tablet 650 mg  650 mg per OG tube Q4H PRN   650 mg at 05/25/16 1458   . bacitracin (BACIIM) 50000 units injection           . calcium citrate-Vitamin D (CITRACAL+D) 315-250 MG-UNIT per tablet 2 tablet  2 tablet Oral Daily   2 tablet at 05/25/16 0914   . carvedilol (COREG) tablet 6.25 mg  6.25 mg Oral Q12H SCH   6.25 mg at 05/25/16 0755   . famotidine (PEPCID) tablet 20 mg  20 mg Oral Daily   20 mg at 05/25/16 0914     Or   . famotidine (PEPCID) injection 20 mg  20 mg Intravenous Daily   20 mg at 05/19/16 0931   . fentaNYL (PF) (SUBLIMAZE) injection 50 mcg  50 mcg Intravenous Q1H PRN       . fluticasone-salmeterol (ADVAIR HFA) 115-21 MCG/ACT inhaler 2 puff  2 puff Inhalation BID   2 puff at 05/25/16 0905   . gabapentin (NEURONTIN) capsule 100 mg  100 mg Oral TID   100 mg at 05/25/16 0914   . hydrALAZINE (APRESOLINE) injection 10 mg  10 mg Intravenous Q3H PRN       . labetalol (NORMODYNE,TRANDATE) injection 10 mg  10 mg Intravenous Q1H PRN       . levothyroxine (SYNTHROID, LEVOTHROID) tablet 112 mcg  112 mcg Oral Daily at 0600   112 mcg at 05/25/16 0525   . lidocaine (XYLOCAINE) 1 % injection           . metoprolol tartrate (LOPRESSOR) injection 5 mg  5 mg Intravenous Q6H PRN       . multivitamin-minerals-folic acid-coenzyme q10 (AquADEKs) chewable tablet 1 tablet  1 tablet per OG tube Daily   1 tablet at 05/25/16 0914   . naloxone [facility]) injection 0.4 mg  0.4 mg Intravenous PRN       . ondansetron (ZOFRAN) injection 4 mg  4 mg Intravenous Q6H PRN       . senna-docusate (  PERICOLACE) 8.6-50 MG per tablet 2 tablet  2 tablet Oral BID   2 tablet at 05/22/16 1742   . simvastatin (ZOCOR) tablet 10 mg  10 mg Oral QHS   10 mg at 05/24/16 2130              Prior to Admission medications    Medication Sig Start Date End Date Taking? Authorizing Provider   acetaminophen (TYLENOL) 500 MG tablet Take 500 mg by mouth 3 (three) times daily.   Yes [provider]   alendronate (FOSAMAX) 70 MG tablet Take 70 mg by mouth every 7 days.Take 1 tab by mouth every 7 days with a full glass of water on an empty stomach. Do not take anything else by mouth or lie down for 30 mins.   Yes [provider]   Calcium Citrate-Vitamin D (CITRACAL + D PO) Take 2 tablets by mouth daily.   Yes [provider]   CO-ENZYME Q-10 PO Take by mouth daily.   Yes [provider]   dofetilide (TIKOSYN) 250 MCG capsule Take 250 mcg by mouth  2 (two) times daily.   Yes [provider]   fluticasone furoate (ARNUITY ELLIPTA) 100 MCG/ACT Aerosol Powder, Breath Activtivatede Inhale 1 puff into the lungs daily.   Yes [provider]   Magnesium 250 MG Tab Take 250 mg by mouth daily.   Yes [provider]   furosemide (LASIX) 20 MG tablet Take 20 mg by mouth daily.        [provider]   gabapentin (NEURONTIN) 100 MG capsule Take 100 mg by mouth 3 (three) times daily.    [provider]   iron-vitamin C (VITRON-C) 65-125 MG Tab Take 1 tablet by mouth every tues and thurs.    [provider]   levothyroxine (SYNTHROID, LEVOTHROID) 112 MCG tablet Take 112 mcg by mouth Once a day at 6:00am.    [provider]   lisinopril (PRINIVIL,ZESTRIL) 5 MG tablet Take 5 mg by mouth daily.    [provider]   Multiple Vitamin (MULTIVITAMIN) tablet Take 1 tablet by mouth daily.    [provider]   Probiotic Product (ALIGN) 4 MG Cap Take by mouth.Take as directed        [provider]   simvastatin (ZOCOR) 10 MG tablet Take 10 mg by mouth nightly.    [provider]   warfarin (COUMADIN) 5 MG tablet Take 5 mg by mouth daily.    [provider]     Vitals   Temp:  [35.6 C (96.1 F)-37.4 C (99.4 F)] 35.8 C (96.4 F)  Heart Rate:  [57-84] 62  Resp Rate:  [16-18] 17  BP: (91-154)/(57-84) 105/57    Wt Readings from Last 3 Encounters:   05/19/16 66.5 kg (146 lb 9.7 oz)   04/17/16 63.5 kg (140 lb 1.6 oz)   03/05/16 65.3 kg (144 lb)     BMI (Estimated body mass index is 25.15 kg/m as calculated from the following:    Height as of this encounter: 1.626 m (5' 4.02").    Weight as of this encounter: 66.5 kg (146 lb 9.7 oz).)  Temp Readings from Last 3 Encounters:   05/25/16 (!) 35.8 C (96.4 F) (Axillary)   04/17/16 36.2 C (97.2 F) (Oral)   03/06/16 36.1 C (97 F) (Temporal Artery)     BP Readings from Last 3 Encounters:   05/25/16 105/57   04/17/16 111/67   03/06/16  112/59     Pulse Readings from Last 3 Encounters:   05/25/16 62   04/17/16 64   03/06/16 68           Labs:   CBC:  Lab Results   Component Value Date    WBC 7.56 05/25/2016    HGB 8.7 (L) 05/25/2016    HCT 27.0 (L) 05/25/2016    PLT 149 05/25/2016       Chemistries:  Lab Results   Component Value Date    NA 137 05/25/2016    K 4.1 05/25/2016    CL 108 05/25/2016    CO2 21 (L) 05/25/2016    BUN 15.0 05/25/2016    CREAT 0.7 05/25/2016    GLU 105 (H) 05/25/2016    CA 8.0 05/25/2016    AST 35 (H) 05/24/2016       Coags:  Lab Results   Component Value Date    PT 14.0 05/25/2016    PTT 84 (H) 05/19/2016    INR 1.1 05/25/2016     _____________________      Signed by: Leslie Dales  05/25/16   3:49 PM    =============================================================      Signed by: Leslie Dales 05/25/16 3:49 PM

## 2016-05-25 NOTE — Plan of Care (Signed)
Problem: Post-op Phase - Cardiac Surgery  Goal: Patient will remain free from post-op complications  Outcome: Progressing  Pt stable at this time, chest xray done, no pneumothorax noted. Left arm placed in sling, incision remains clean dry intact. Fall precautions in place will continue with plan of care.    Comments: Pt remains stable, chest xray showed no pneumothorax. Left arm placed in sling, remains on bedrest at this time, fall precautions in place, will continue with plan of care.

## 2016-05-25 NOTE — Transfer of Care (Signed)
Procedure:PM Implant Single - [facility]  MAC Anesthesia  icar for Phase 2  See icar 36 for Vital Signs  Pain Well controlled  Hemodynamically stable  Appropriate mental status  Tolerating Room Air  N/V Controlled  No Obvious anesthetic complications

## 2016-05-25 NOTE — Progress Notes (Signed)
MEDICINE PROGRESS NOTE    Date Time: 05/25/16 9:42 PM  Patient Name: [name],[name]  Attending Physician: Janalee Dane, MD    Assessment:   Active Problems:    Atrial fibrillation    CHF (congestive heart failure)    Ischemic embolic stroke    Decreased diffusion capacity    PFO (patent foramen ovale)    Acquired hypothyroidism    Chronic systolic heart failure    PAH (pulmonary artery hypertension)    Non-rheumatic mitral regurgitation    Respiratory failure    Ischemic stroke    Altered mental status, unspecified altered mental status type    Closed pertrochanteric fracture of femur, left, initial encounter  Resolved Problems:    * No resolved hospital problems. *    82 Young.o. female with left displaced reverse obliquity intertrochanteric femur fracture and VT    Plan:     1) Left displaced intertrochenteric fracture - s/p closed reduction - PT/OT working with pt and recommended subacute rehab - pain well controlled   lovenox for 6 weeks   swellingof the leg most likely post op as [place] negative    2) VT and torsades ; ep on board  amio dcd  Sec to lung disease  QTc stable now   ICD to be placed today    3) Recurrent afib : On lovenox - coumadin on hold for now after surgery- resume whrn appropiate      4)Hypothyroidism : ON replacement     5) Anemia : S/p Blood transfusion- continue to monitor- Hb stable    6) Disp : Placement   Case discussed with: RN and pt     Safety Checklist:     DVT prophylaxis:  CHEST guideline (See page [id number]) Chemical   Foley:  Gideon Rn Foley protocol Not present   IVs:  Peripheral IV   PT/OT: Ordered   Daily CBC & or Chem ordered:  SHM/ABIM guidelines (see #5) Yes, due to clinical and lab instability   Reference for approximate charges of common labs: CBC auto diff - $76  BMP - $99  Mg - $79    Lines:     Patient Lines/Drains/Airways Status    Active PICC Line / CVC Line / PIV Line / Drain / Airway / Intraosseous Line / Epidural Line / ART Line / Line / Wound / Pressure Ulcer /  NG/OG Tube     Name:   Placement date:   Placement time:   Site:   Days:    Peripheral IV 05/21/16 Right Forearm  05/21/16    0312    Forearm    less than 1    Incision Site 05/20/16 Hip Left  05/20/16    1643      1                 Disposition: (Please see PAF column for Expected D/C Date)   Today's date: 05/25/2016  Admit Date: 05/18/2016 10:42 PM  LOS: 6  Clinical Milestones: On lovenox- icd today  Anticipated discharge needs:placement       Subjective     CC: <principal problem not specified>    Interval History/24 hour events:no fever chills abd pain nausea vomiting or diarhea - pain well controlled    HPI/Subjective: came in with shock for VT    Review of Systems:   As per HPI     Physical Exam:     VITAL SIGNS PHYSICAL EXAM   Temp:  [96.1 F (  35.6 C)-98.1 F (36.7 C)] 97.5 F (36.4 C)  Heart Rate:  [58-78] 75  Resp Rate:  [16-20] 18  BP: (91-154)/(57-85) 146/67  Blood Glucose:        Intake/Output Summary (Last 24 hours) at 05/25/16 2142  Last data filed at 05/25/16 1734   Gross per 24 hour   Intake              300 ml   Output                0 ml   Net              300 ml    Physical Exam  General: awake, alert X o3  Cardiovascular: regular rate and rhythm, no murmurs, rubs or gallops  Lungs: clear to auscultation bilaterally, without wheezing, rhonchi, or rales  Abdomen: soft, non-tender, non-distended; no palpable masses,  normoactive bowel sounds  Extremities: left leg swelling and limited motion         Meds:     Medications were reviewed:    Labs:     Labs (last 72 hours):      Recent Labs  Lab 05/25/16  0227 05/24/16  0329   WBC 7.56 5.40   Hgb 8.7* 8.2*   Hematocrit 27.0* 25.4*   Platelets 149 130*         Recent Labs  Lab 05/25/16  0227 05/24/16  0329  05/19/16  1800   PT 14.0 14.0 More results in Results Review 39.5*   PT INR 1.1 1.1 More results in Results Review 4.2*   PTT  --   --   --  84*   More results in Results Review = values in this interval not displayed.   Recent Labs  Lab  05/25/16  0228 05/24/16  0329 05/23/16  0423   Sodium 137 140 139   Potassium 4.1 4.1 3.9   Chloride 108 109 110   CO2 21* 21* 22   BUN 15.0 20.0* 26.0*   Creatinine 0.7 0.7 0.7   Calcium 8.0 7.8* 7.7*   Albumin  --  2.9* 2.9*   Protein, Total  --  5.2* 5.2*   Bilirubin, Total  --  1.3* 1.2   Alkaline Phosphatase  --  67 60   ALT  --  28 32   AST (SGOT)  --  35* 41*   Glucose 105* 86 94                       Signed by: Janalee Dane, MD

## 2016-05-25 NOTE — Progress Notes (Addendum)
Orthopedic Daily Progress Note    05/25/2016   6:20 AM    [name] is a 82 y.o. female  5 Days Post-Op  S/p Procedure(s) (LRB):  CLOSED REDUCTION, INSERTION, HIP IM NAIL (GAMMA) (Left) L IT fx    Subjective: NAEON, relates difficulty sleeping due to neck and back pain.  Patient scheduled for ICD implant today at 3PM. She denies any nausea, vomiting, or fevers.     Physical Exam:  Vitals:    05/25/16 0431   BP: 154/82   Pulse: 72   Resp: 18   Temp: (!) 96.1 F (35.6 C)   SpO2: 94%      No intake or output data in the 24 hours ending 05/25/16 0620    Left Lower Extremity:   Serous yellow drainage on dressings  Wiggles toes well                                                                                                           SILT SP/DP/T                                                                                                                     2+ DP/PT  Cap refill <2 secs      Results     Procedure Component Value Units Date/Time    Prothrombin time/INR [[id number]] Collected:  05/25/16 0227    Specimen:  Blood Updated:  05/25/16 0344     PT 14.0 sec      PT INR 1.1     PT Anticoag. Given Within 48 hrs. warfarin ([place]    Basic Metabolic Panel [[id number]]  (Abnormal) Collected:  05/25/16 0228    Specimen:  Blood Updated:  05/25/16 0338     Glucose 105 (H) mg/dL      BUN 78.2 mg/dL      Creatinine 0.7 mg/dL      Calcium 8.0 mg/dL      Sodium 956 mEq/L      Potassium 4.1 mEq/L      Chloride 108 mEq/L      CO2 21 (L) mEq/L     GFR [[id number]] Collected:  05/25/16 0228     Updated:  05/25/16 0338     EGFR >60.0    CBC and differential [[id number]]  (Abnormal) Collected:  05/25/16 0227    Specimen:  Blood from Blood Updated:  05/25/16 0305     WBC 7.56 x10 3/uL      Hgb 8.7 (L) g/dL      Hematocrit 41.3 (L) %  Platelets 149 x10 3/uL      RBC 2.75 (L) x10 6/uL      MCV 98.2 fL      MCH 31.6 pg      MCHC 32.2 g/dL      RDW 15 %      MPV 10.6 fL      Nucleated RBC 1.5 (H) /100 WBC      Absolute NRBC  0.11 (H) x10 3/uL      Neutrophils 66.0 %      Lymphocytes Automated 20.8 %      Monocytes 10.4 %      Eosinophils Automated 2.0 %      Basophils Automated 0.1 %      Immature Granulocyte 0.7 %      Neutrophils Absolute 4.99 x10 3/uL      Abs Lymph Automated 1.57 x10 3/uL      Abs Mono Automated 0.79 x10 3/uL      Abs Eos Automated 0.15 x10 3/uL      Absolute Baso Automated 0.01 x10 3/uL      Absolute Immature Granulocyte 0.05 x10 3/uL     Prothrombin time/INR [[id number]] Collected:  05/24/16 0329    Specimen:  Blood Updated:  05/24/16 0647     PT 14.0 sec      PT INR 1.1     PT Anticoag. Given Within 48 hrs. warfarin ([place]    Comprehensive metabolic panel [[id number]]  (Abnormal) Collected:  05/24/16 0329    Specimen:  Blood Updated:  05/24/16 0640     Glucose 86 mg/dL      BUN 78.2 (H) mg/dL      Creatinine 0.7 mg/dL      Sodium 956 mEq/L      Potassium 4.1 mEq/L      Chloride 109 mEq/L      CO2 21 (L) mEq/L      Calcium 7.8 (L) mg/dL      Protein, Total 5.2 (L) g/dL      Albumin 2.9 (L) g/dL      AST (SGOT) 35 (H) U/L      ALT 28 U/L      Alkaline Phosphatase 67 U/L      Bilirubin, Total 1.3 (H) mg/dL      Globulin 2.3 g/dL      Albumin/Globulin Ratio 1.3    GFR [[id number]] Collected:  05/24/16 0329     Updated:  05/24/16 0640     EGFR >60.0    CBC and differential [[id number]]  (Abnormal) Collected:  05/24/16 0329    Specimen:  Blood from Blood Updated:  05/24/16 0630     WBC 5.40 x10 3/uL      Hgb 8.2 (L) g/dL      Hematocrit 41.3 (L) %      Platelets 130 (L) x10 3/uL      RBC 2.59 (L) x10 6/uL      MCV 98.1 fL      MCH 31.7 pg      MCHC 32.3 g/dL      RDW 15 %      MPV 10.9 fL      Nucleated RBC 1.7 (H) /100 WBC      Absolute NRBC 0.09 (H) x10 3/uL      Neutrophils 69.5 %      Lymphocytes Automated 18.1 %      Monocytes 9.6 %      Eosinophils Automated 1.7 %      Basophils Automated  0.2 %      Immature Granulocyte 0.9 %      Neutrophils Absolute 3.75 x10 3/uL      Abs Lymph Automated 0.98 x10 3/uL      Abs Mono  Automated 0.52 x10 3/uL      Abs Eos Automated 0.09 x10 3/uL      Absolute Baso Automated 0.01 x10 3/uL      Absolute Immature Granulocyte 0.05 x10 3/uL           Assessment: s/p Procedure(s) (LRB):  CLOSED REDUCTION, INSERTION, HIP IM NAIL (GAMMA) (Left)L IT fx    Plan:   Mobility: Out of bed as tolerated with PT/OT   Pain control: Continue to wean/titrate to appropriate oral regimen   DVT Prophylaxis (per Ortho Trauma service Protocol): Lovenox for 6 weeks postop (fractures from Pelvis to subtrochanteric femur), currently on hold per Medicine, will resume when appropriate   Foley catheter status: Per protocol   Further surgical plans: No further Orthopedic plans      RUE: WBAT   LUE:  WBAT   RLE:  WBAT   LLE:  WBAT   Disposition: Skilled Nursing Facility when medically stable       [employer]  Dept of Orthopedics  Page [phone] or call [phone] for questions    Attending Addendum/Attestation:    I have personally seen and examined this patient and have participated in their care. I agree with the clinical information, including the physical exam, patient history, and planning as documented by the Resident or Physician Assistant. In addition, I have edited this note to reflect my findings and plan as well as to incorporate any new data.    Patient with mild drainage on dressings, likely secondary to Lovenox.  Continue with BID dressing changes.  Will follow    Chevis Pretty, MD  Orthopaedic Trauma  Pager [phone]  Office [phone]

## 2016-05-25 NOTE — Brief Op Note (Signed)
Brief Op note    Pre-op dx: cardiac arrest, non-ischemic cmy  Post-op dx: same  Procedure: Dual Chamber SJM ICD implant via left cephalic vein  Plan:  Bed rest x 4 hrs  CXR to document lead position  24hrs IV abx  Interrogation in AM  Will check with ortho timing of restarting OAC

## 2016-05-25 NOTE — Plan of Care (Addendum)
Problem: Safety  Goal: Patient will be free from injury during hospitalization  Outcome: Progressing    Goal: Patient will be free from infection during hospitalization  Outcome: Progressing      Problem: Pain  Goal: Pain at adequate level as identified by patient  Outcome: Progressing      Problem: Every Day - Stroke  Goal: Neurological status is stable or improving  Outcome: Progressing   05/25/16 0139   Goal/Interventions addressed this shift   Neurological status is stable or improving Monitor/assess/document neurological assessment (Stroke: every 4 hours);Perform CAM Assessment     Goal: Stable vital signs and fluid balance  Outcome: Progressing    Goal: Mobility/Activity is maintained at optimal level for patient  Outcome: Progressing    Goal: Skin integrity is maintained or improved  Outcome: Progressing      Problem: Impaired Mobility  Goal: Mobility/Activity is maintained at optimal level for patient  Outcome: Progressing      Problem: Compromised skin integrity  Goal: Skin integrity is maintained or improved  Outcome: Progressing      Problem: Neurological Deficit  Goal: Neurological status is stable or improving  Outcome: Progressing   05/25/16 0139   Goal/Interventions addressed this shift   Neurological status is stable or improving Monitor/assess/document neurological assessment (Stroke: every 4 hours);Perform CAM Assessment       Comments: Pt. A & O x 4, speech clear, follows commands, decreased sensation of LLE, pain with movement of LLE.  Pt. NPO after midnight, is to have ICD implanted this afternoon @ 3pm. Pt. C/o of neck pain, prn tylenol given and resulted in good relief.  VSS stable.  A-fib noted on Tele, with PVCS.  Serosanguineous drainage noted from middle dressing on L thigh, dressing changed and reinforced.  Fall precautions maintained, bed in lowest position, call bell within reach.  Will continue to monitor

## 2016-05-26 DIAGNOSIS — I472 Ventricular tachycardia: Principal | ICD-10-CM

## 2016-05-26 LAB — ECG 12-LEAD
Atrial Rate: 78 {beats}/min
Atrial Rate: 84 {beats}/min
Atrial Rate: 74 {beats}/min
P Axis: 74 degrees
P Axis: 48 degrees
P Axis: 43 degrees
P-R Interval: 166 ms
P-R Interval: 170 ms
P-R Interval: 166 ms
Q-T Interval: 396 ms
Q-T Interval: 396 ms
Q-T Interval: 420 ms
QRS Duration: 120 ms
QRS Duration: 118 ms
QRS Duration: 118 ms
QTC Calculation (Bezet): 451 ms
QTC Calculation (Bezet): 467 ms
QTC Calculation (Bezet): 466 ms
R Axis: -44 degrees
R Axis: -50 degrees
R Axis: -40 degrees
T Axis: 133 degrees
T Axis: 93 degrees
T Axis: 118 degrees
Ventricular Rate: 78 {beats}/min
Ventricular Rate: 84 {beats}/min
Ventricular Rate: 74 {beats}/min

## 2016-05-26 LAB — CBC
Absolute NRBC: 0.03 10*3/uL — ABNORMAL HIGH
Hematocrit: 25.6 % — ABNORMAL LOW (ref 37.0–47.0)
Hgb: 8 g/dL — ABNORMAL LOW (ref 12.0–16.0)
MCH: 30.9 pg (ref 28.0–32.0)
MCHC: 31.3 g/dL — ABNORMAL LOW (ref 32.0–36.0)
MCV: 98.8 fL (ref 80.0–100.0)
MPV: 10.9 fL (ref 9.4–12.3)
Nucleated RBC: 0.4 /100 WBC (ref 0.0–1.0)
Platelets: 141 10*3/uL (ref 140–400)
RBC: 2.59 10*6/uL — ABNORMAL LOW (ref 4.20–5.40)
RDW: 15 % (ref 12–15)
WBC: 6.77 10*3/uL (ref 3.50–10.80)

## 2016-05-26 LAB — PT/INR
PT INR: 1.2 — ABNORMAL HIGH (ref 0.9–1.1)
PT: 14.8 s (ref 12.6–15.0)

## 2016-05-26 MED ORDER — OXYCODONE-ACETAMINOPHEN 5-325 MG PO TABS
1.0000 | ORAL_TABLET | Freq: Four times a day (QID) | ORAL | Status: AC | PRN
Start: 2016-05-26 — End: 2016-05-26
  Administered 2016-05-26: 1 via ORAL
  Filled 2016-05-26: qty 1

## 2016-05-26 MED ORDER — CARVEDILOL 6.25 MG PO TABS
12.5000 mg | ORAL_TABLET | Freq: Two times a day (BID) | ORAL | Status: DC
Start: 2016-05-26 — End: 2016-05-27
  Administered 2016-05-26 – 2016-05-27 (×2): 12.5 mg via ORAL
  Filled 2016-05-26 (×2): qty 2

## 2016-05-26 MED ORDER — SENNOSIDES-DOCUSATE SODIUM 8.6-50 MG PO TABS
2.0000 | ORAL_TABLET | Freq: Two times a day (BID) | ORAL | Status: DC | PRN
Start: 2016-05-26 — End: 2016-05-27
  Filled 2016-05-26 (×2): qty 2

## 2016-05-26 MED ORDER — WARFARIN SODIUM 5 MG PO TABS
5.0000 mg | ORAL_TABLET | Freq: Every day | ORAL | Status: DC
Start: 2016-05-26 — End: 2016-05-27
  Administered 2016-05-26 – 2016-05-27 (×2): 5 mg via ORAL
  Filled 2016-05-26 (×2): qty 1

## 2016-05-26 NOTE — OT Progress Note (Signed)
Occupational Therapy Cancellation Note    Patient: [name]  EXB:[id number]    Unit: FICAR/FICAR-36    Patient not seen for occupational therapy secondary to pt needs new PT/OT orders s/p ICD placement.   Chancy Hurter OTR/L   Pager # [phone]

## 2016-05-26 NOTE — Progress Notes (Signed)
NEUROLOGY PROGRESS NOTE    Date Time: 05/26/16 9:01 AM  Patient Name: [name],[name]  Attending Physician: Shawnee Knapp, MD    BRIEF HISTORY:   REASON FOR CONSULTATION:  Unwitnessed fall, altered mental status, rule out stroke.     HISTORY OF PRESENT ILLNESS:  The patient is an 82 year old woman with history of prior stroke, mitral  regurgitation, atrial fibrillation on Coumadin, congestive heart failure  who comes into the hospital with unwitnessed fall, left-sided weakness,  concerned about stroke.  The patient is currently intubated but alert.   History is mostly from medical records.  The patient was watching TV with  her husband last night when she went to another room; 30 to 40 minutes  later husband went to check on her and found her lying on the floor.  She  was unconscious and would not answer.  EMS was called.  The patient was  noted to have wide complex tachycardia en route.  This was suspected as  ventricular tachycardia.  She was cardioverted x2.  The patient in the ED  had NIH stroke scale of 21.  CT brain scan negative for any bleed, no TPA  was administered due to her INR of 3.7.  The patient currently is more  awake, responsive, denies pain.  She is intubated in the ICU.         Chief Complaints:   Left leg pain, Post surgery  Left hip fracture  EP eval for arrhythmia   ICD placed      Interval History/24 hour events:   In ICAR 36   Denies headache or confusion  Likely syncope from cardiac arrhythmia caused fall with left hip fracture  MRI cancelled as low probability of stroke  Overall much better  No headache or confusion       Review Of Systems:  No headaches, dizziness, confusion, focal weakness or numbness  No deafness, tinnitus, blurred vision, or vision loss, No double vision or droopy lids  No speech or swallowing problems  Balance poor,+ Falls  No bowel or bladder incontinence  No fever or chills        Physical Exam:     Vitals:    05/26/16 0740   BP: 135/88   Pulse: 86   Resp: 20    Temp: 97.9 F (36.6 C)   SpO2: 96%       Intake and Output Summary (Last 24 hours) at Date Time    Intake/Output Summary (Last 24 hours) at 05/26/16 0901  Last data filed at 05/25/16 2323   Gross per 24 hour   Intake              540 ml   Output                0 ml   Net              540 ml       General: No apparent distress, comfortable appearing, vital signs noted above  Head: Mild bruise right temple and normocephalic  Eyes: Anicteric sclerae, moist conjunctiva, PERRLA   ENT: Oropharynx clear; no erythema or exudates;   Neck: supple; FROM; no mass   Extremities: No clubbing, cyanosis or edema, left hip fracture  CV: Regular rate and rhythm, no murmurs  Lungs: clear  Skin: No rash, lesions, ulcers   Neurological Examination  Mental Status:   Alert, oriented, to time, place, person and situation, confused about surgery   Clear speech, normal  language, including fluency, naming,     Normal short term and remote memory.  Adequate knowledge.   Affect appropriate   CN 2-12    EOM full, PERL, Normal fields and fundi, Face symmetric, tongue midline with normal bulk and no fasicualtions  Hearing, facial sensations and Gag reflex intact   Motor system: Normal tone, bulk, no involuntary movements.     Power 4/5.  No cog wheeling, tremors, or bradykinesia.   Sensory system: Normal touch, pin prick vibration in all 4 extremities; no sensory level   Reflexes:  Deep tendon reflexes symmetric, Negative Babinski  Cerebellar:  Mild tremor  Gait: Not tested  Neurovascular: No bruits.  Signs of meningeal irritation: None        Other pertinent positive findings:       Meds:     Current Facility-Administered Medications   Medication Dose Route Frequency   . calcium citrate-Vitamin D  2 tablet Oral Daily   . carvedilol  6.25 mg Oral Q12H SCH   . ceFAZolin  1 g Intravenous Q8H   . famotidine  20 mg Oral Daily    Or   . famotidine  20 mg Intravenous Daily   . fluticasone-salmeterol  2 puff Inhalation BID   . gabapentin  100 mg Oral  TID   . levothyroxine  112 mcg Oral Daily at 0600   . multivitamin-minerals-folic acid-coenzyme q10  1 tablet per OG tube Daily   . senna-docusate  2 tablet Oral BID   . simvastatin  10 mg Oral QHS   . warfarin  5 mg Oral Daily at 1800       Labs:     Results     Procedure Component Value Units Date/Time    Prothrombin time/INR [[id number]]  (Abnormal) Collected:  05/26/16 0448    Specimen:  Blood Updated:  05/26/16 0615     PT 14.8 sec      PT INR 1.2 (H)     PT Anticoag. Given Within 48 hrs. warfarin ([place]    CBC [[id number]]  (Abnormal) Collected:  05/26/16 0448    Specimen:  Blood from Blood Updated:  05/26/16 0535     WBC 6.77 x10 3/uL      Hgb 8.0 (L) g/dL      Hematocrit 78.2 (L) %      Platelets 141 x10 3/uL      RBC 2.59 (L) x10 6/uL      MCV 98.8 fL      MCH 30.9 pg      MCHC 31.3 (L) g/dL      RDW 15 %      MPV 10.9 fL      Nucleated RBC 0.4 /100 WBC      Absolute NRBC 0.03 (H) x10 3/uL             Imaging personally reviewed, including:     Radiology Results (24 Hour)     Procedure Component Value Units Date/Time    XR Chest AP Portable [[id number]] Collected:  05/25/16 1812    Order Status:  Completed Updated:  05/25/16 1818    Narrative:       HISTORY:  s/p icd placement     COMPARISON: 05/22/2016.    FINDINGS: The examination is performed portably. Overlying leads limit  assessment. Patient is rotated to the right. There is a new left-sided  ICD with leads extending to right atrium and right jugular region.  Cardiomediastinal silhouette, aortic unfolding and central vasculature  are unchanged. There is left basilar atelectasis and obscuration of both  costophrenic angles. There is no pneumothorax.      Impression:        New left-sided ICD and leads as noted. Left basilar  atelectasis and obscuration of both costophrenic angles. No  pneumothorax.    Prince Solian, MD   05/25/2016 6:13 PM              Assessment:     Patient Active Problem List   Diagnosis   . Celiac disease   . Scoliosis   . Atrial  fibrillation   . Mitral valve disorder   . Disease of tricuspid valve   . Chronic airway obstruction, not elsewhere classified   . AF (atrial fibrillation)   . Paroxysmal a-fib   . CHF (congestive heart failure)   . Ischemic embolic stroke   . Decreased diffusion capacity   . PFO (patent foramen ovale)   . Acquired hypothyroidism   . Chronic systolic heart failure   . PAH (pulmonary artery hypertension)   . Non-rheumatic mitral regurgitation   . Respiratory failure   . Ischemic stroke   . Altered mental status, unspecified altered mental status type   . Closed pertrochanteric fracture of femur, left, initial encounter     Fall  Syncope, cardiac arrhythmia   Left hip fracture  Coagulopathy  A fib post ablation   [place] type VT   ICD placed    Plan/Recommendations:   General measures:  Diet/activity/Precuations: Per routine from admitting orders  Symptomatic measures: as needed  Diagnostics:Reviewed CT/CTA, ICD placed  Medications:Noted  Consultation:Ortho for hip surgery,  Physical/Occupational/Speech Therapy:As needed  Patient/Family Education/Counseling:Provided        Anticipated discharge disposition and date: Unknown     Case discussed with: Nursing/Team        Signed by: Shawnee Knapp, MD

## 2016-05-26 NOTE — Progress Notes (Signed)
Orthopedic Daily Progress Note    05/26/2016   5:50 AM    [name] is a 82 y.o. female  1 Day Post-Op  S/p Procedure(s) (LRB):  PM Implant Single (N/A) L IT fx    Subjective: NAEON.  Denies any nausea, vomiting, or fevers.     Physical Exam:  Vitals:    05/26/16 0400   BP: 129/64   Pulse: 74   Resp: 18   Temp: 97.9 F (36.6 C)   SpO2: 94%        Intake/Output Summary (Last 24 hours) at 05/26/16 0550  Last data filed at 05/25/16 2323   Gross per 24 hour   Intake              540 ml   Output                0 ml   Net              540 ml       Left Lower Extremity:   Serous yellow drainage on dressings; changed at bedside  Surgical site coapted, no e/o dehiscence, staples intact  Wiggles toes well                                                                                                           SILT SP/DP/T                                                                                                                     2+ DP/PT  Cap refill <2 secs      Results     Procedure Component Value Units Date/Time    CBC [[id number]]  (Abnormal) Collected:  05/26/16 0448    Specimen:  Blood from Blood Updated:  05/26/16 0535     WBC 6.77 x10 3/uL      Hgb 8.0 (L) g/dL      Hematocrit 40.9 (L) %      Platelets 141 x10 3/uL      RBC 2.59 (L) x10 6/uL      MCV 98.8 fL      MCH 30.9 pg      MCHC 31.3 (L) g/dL      RDW 15 %      MPV 10.9 fL      Nucleated RBC 0.4 /100 WBC      Absolute NRBC 0.03 (H) x10 3/uL     Prothrombin time/INR [[id number]] Collected:  05/26/16 0448    Specimen:  Blood Updated:  05/26/16 9562  Assessment: s/p Procedure(s) (LRB):  PM Implant Single (N/A)L IT fx    Plan:   Mobility: Out of bed as tolerated with PT/OT   Pain control: Continue to wean/titrate to appropriate oral regimen   DVT Prophylaxis (per Ortho Trauma service Protocol): Lovenox for 6 weeks postop (fractures from Pelvis to subtrochanteric femur), currently on hold per Medicine, will resume when appropriate   Foley  catheter status: Per protocol   Further surgical plans: No further Orthopedic plans; daily BID dressing changed LLE     RUE: WBAT   LUE:  WBAT   RLE:  WBAT   LLE:  WBAT   Disposition: Skilled Nursing Facility when medically stable       [facility]  Dept of Orthopedics  Page [phone] or call [phone] for questions

## 2016-05-26 NOTE — Progress Notes (Signed)
MEDICINE PROGRESS NOTE    Date Time: 05/26/16 7:54 AM  Patient Name: [name],[name]  Attending Physician: Baker Pierini, MD    Assessment:   Active Problems:    Atrial fibrillation    CHF (congestive heart failure)    Ischemic embolic stroke    Decreased diffusion capacity    PFO (patent foramen ovale)    Acquired hypothyroidism    Chronic systolic heart failure    PAH (pulmonary artery hypertension)    Non-rheumatic mitral regurgitation    Respiratory failure    Ischemic stroke    Altered mental status, unspecified altered mental status type    Closed pertrochanteric fracture of femur, left, initial encounter  Resolved Problems:    * No resolved hospital problems. *      Ms. Mcgahan is a delightful 82 year old woman with hx of afib on coumadin, chronic systolic heart failure, pulmonary HTN with VF arrest/torsades complicated by left hip fracture now s/p repair of hip fracture and placement of ICD.     Plan:     # For hip fracture: s/p closed reduction with ortho service.  - DVT ppx with coumadin not Lovenox in setting of ICD placement (discussed with ortho resident)  - Ortho recommendations and input appreciated  - PT/OT re-ordered  - Tylenol for pain    # For VT with torsades: was initially on amiodarone at time of admission which was stopped in setting of underlying pulmonary disease.  - s/p dual chamber ICD placement on 12/4  - telemetry monitoring    # For afib on coumadin: previously on Tikosyn which has been held.  - Resume warfarin at 5mg  PO QDay (home dose); patient has previously done load of 7.5mg  QDay but would avoid aggressive anticoagulation given ICD placement  - Increase Coreg to 12.5mg  BID     # For chronic systolic heart failure: no exacerbation  - Continue Coreg with increased dose  - Resume lisinopril and Lasix    Case discussed with: patient, ortho resident, EP    Safety Checklist:     DVT prophylaxis:  CHEST guideline (See page [id number]) Chemical   Foley:  [place] R[facility] Foley protocol Not present    IVs:  Peripheral IV   PT/OT: Ordered   Daily CBC & or Chem ordered:  SHM/ABIM guidelines (see #5) Yes, due to clinical and lab instability   Reference for approximate charges of common labs: CBC auto diff - $76  BMP - $99  Mg - $79    Lines:     Patient Lines/Drains/Airways Status    Active PICC Line / CVC Line / PIV Line / Drain / Airway / Intraosseous Line / Epidural Line / ART Line / Line / Wound / Pressure Ulcer / NG/OG Tube     Name:   Placement date:   Placement time:   Site:   Days:    Peripheral IV 05/21/16 Right Forearm  05/21/16    0312    Forearm    5    Peripheral IV 05/25/16 Left Wrist  05/25/16    1600    Wrist    less than 1    Incision Site 05/20/16 Hip Left  05/20/16    1643      5    Incision Site 05/25/16 Chest Left  05/25/16    1640      less than 1                 Disposition: (Please  see PAF column for Expected D/C Date)   Today's date: 05/26/2016  Admit Date: 05/18/2016 10:42 PM  LOS: 7  Clinical Milestones: PT/OT eval  Anticipated discharge needs: TBD      Subjective     CC: VT    Interval History/24 hour events: Patient underwent ICD placement yesterday.    HPI/Subjective: Patient is in good spirits, able to joke about hospitalization. She has discomfort in her chest and her hip (both to be expected) which she feels are managable with Tylenol. No BM today, prefers not to take meds for constipation.     Review of Systems:     As per HPI    Physical Exam:     VITAL SIGNS PHYSICAL EXAM   Temp:  [96.4 F (35.8 C)-98.1 F (36.7 C)] 97.9 F (36.6 C)  Heart Rate:  [62-86] 86  Resp Rate:  [16-20] 20  BP: (98-146)/(56-88) 135/88      Telemetry: NSR      Intake/Output Summary (Last 24 hours) at 05/26/16 0754  Last data filed at 05/25/16 2323   Gross per 24 hour   Intake              540 ml   Output                0 ml   Net              540 ml    Physical Exam  General: awake, alert X oriented x 3, NAD  Cardiovascular: regular rate and rhythm, no murmurs, rubs or gallops  Chest: ICD site C/D/I    Lungs: clear to auscultation bilaterally, without wheezing, rhonchi, or rales  Abdomen: soft, non-tender, non-distended; no palpable masses,  normoactive bowel sounds  Extremities: no edema         Meds:     Medications were reviewed:  Current Facility-Administered Medications   Medication Dose Route Frequency   . calcium citrate-Vitamin D  2 tablet Oral Daily   . carvedilol  6.25 mg Oral Q12H SCH   . ceFAZolin  1 g Intravenous Q8H   . famotidine  20 mg Oral Daily    Or   . famotidine  20 mg Intravenous Daily   . fluticasone-salmeterol  2 puff Inhalation BID   . gabapentin  100 mg Oral TID   . levothyroxine  112 mcg Oral Daily at 0600   . multivitamin-minerals-folic acid-coenzyme q10  1 tablet per OG tube Daily   . senna-docusate  2 tablet Oral BID   . simvastatin  10 mg Oral QHS     Current Facility-Administered Medications   Medication Dose Route Frequency Last Rate     Current Facility-Administered Medications   Medication Dose Route   . sodium chloride   Intravenous   . acetaminophen  650 mg per OG tube   . fentaNYL (PF)  25 mcg Intravenous   . fentaNYL (PF)  50 mcg Intravenous   . hydrALAZINE  10 mg Intravenous   . HYDROmorphone  0.2 mg Intravenous   . labetalol  10 mg Intravenous   . metoclopramide  10 mg Intravenous   . metoprolol tartrate  5 mg Intravenous   . naloxone  0.4 mg Intravenous   . ondansetron  4 mg Intravenous   . ondansetron  4 mg Intravenous   . oxyCODONE-acetaminophen  1 tablet Oral         Labs:     Labs (last 72 hours):  Recent Labs  Lab 05/26/16  0448 05/25/16  0227   WBC 6.77 7.56   Hgb 8.0* 8.7*   Hematocrit 25.6* 27.0*   Platelets 141 149         Recent Labs  Lab 05/26/16  0448 05/25/16  0227  05/19/16  1800   PT 14.8 14.0 More results in Results Review 39.5*   PT INR 1.2* 1.1 More results in Results Review 4.2*   PTT  --   --   --  84*   More results in Results Review = values in this interval not displayed.   Recent Labs  Lab 05/25/16  0228 05/24/16  0329 05/23/16  0423   Sodium  137 140 139   Potassium 4.1 4.1 3.9   Chloride 108 109 110   CO2 21* 21* 22   BUN 15.0 20.0* 26.0*   Creatinine 0.7 0.7 0.7   Calcium 8.0 7.8* 7.7*   Albumin  --  2.9* 2.9*   Protein, Total  --  5.2* 5.2*   Bilirubin, Total  --  1.3* 1.2   Alkaline Phosphatase  --  67 60   ALT  --  28 32   AST (SGOT)  --  35* 41*   Glucose 105* 86 94                   Microbiology, reviewed and are significant for:  Microbiology Results     Procedure Component Value Units Date/Time    MRSA culture [[id number]] Collected:  05/19/16 0414    Specimen:  Body Fluid from Nasal/Throat ASC Admission Updated:  05/20/16 0301    Narrative:       ORDER#: [id number]                                    ORDERED BY: Clent Ridges, PATRICK  SOURCE: Nares and Throat                             COLLECTED:  05/19/16 04:14  ANTIBIOTICS AT COLL.:                                RECEIVED :  05/19/16 05:29  Culture MRSA Surveillance                  FINAL       05/20/16 03:01  05/20/16   Negative for Methicillin Resistant Staph aureus from Nares and             Negative for Methicillin Resistant Staph aureus from Throat            Imaging, reviewed and are significant for:  No new imaging      Signed by: Baker Pierini, MD

## 2016-05-26 NOTE — Plan of Care (Signed)
Problem: Moderate/High Fall Risk Score >5  Goal: Patient will remain free of falls  Outcome: Progressing   05/25/16 2015   OTHER   High (Greater than 13) HIGH-Consider use of low bed       Problem: Pain  Goal: Pain at adequate level as identified by patient  Outcome: Progressing   05/26/16 0020   Goal/Interventions addressed this shift   Pain at adequate level as identified by patient Identify patient comfort function goal;Reassess pain within 30-60 minutes of any procedure/intervention, per Pain Assessment, Intervention, Reassessment (AIR) Cycle       Problem: Post-op Phase - Cardiac Surgery  Goal: Patient will remain free from post-op complications  Outcome: Progressing   05/26/16 0020   Goal/Interventions addressed this shift   Patient will remain free from post-op complications  Assess dressing and reinforce or change per LIP order;Assess surgical incision/wound site and treat per LIP order

## 2016-05-26 NOTE — Progress Notes (Addendum)
[place] [facility]   PROGRESS NOTE     [facility]       Date Time: 05/26/16 9:06 AM Patient Name: [name], [name]  Medical Record #:  [id number] Account#:  [id number] Admission Date:  05/18/2016      Assessment:    Admitted 27 Nov 17 with LOC and VT with shocks from EMS and recurrent torsades, prolonged QT on tikosyn.     S/P St Jude ICD implant 4 Dec 17.   Recurrent AF s/p extensive biatrial FIRM, PVI, RA isthmus on 6 Sep 17.   CHADSVASC - 3.  Off warfarin d/t surgery.   Intolerant to amiodarone and tikosyn (pulmonary toxicity and long QT)   L femur fracture s/p surgical CLOSED REDUCTION, INSERTION, HIP IM NAIL (GAMMA) (Left) L IT fx 29 Nov 17.  Followed by orthopedics.   Anemia    Recommendations    Wound care instructions and physical activity restrictions reviewed with patient.   Recommend starting OAC when ortho/medicine team deem necessary, with INR goal of 2-3.   Increase coreg to 12.5mg  BID.   Continue to monitor via telemetry.   Follow-up in 1 week for site check, and 4-6 weeks for new St Jude ICD check.  Our schedulers will call for appointment.   OK for discharge from EP perspective.  EP to sign off, but will be available via spectralink listed below.        Patient Active Problem List   Diagnosis   . Celiac disease   . Scoliosis   . Atrial fibrillation   . Mitral valve disorder   . Disease of tricuspid valve   . Chronic airway obstruction, not elsewhere classified   . AF (atrial fibrillation)   . Paroxysmal a-fib   . CHF (congestive heart failure)   . Ischemic embolic stroke   . Decreased diffusion capacity   . PFO (patent foramen ovale)   . Acquired hypothyroidism   . Chronic systolic heart failure   . PAH (pulmonary artery hypertension)   . Non-rheumatic mitral regurgitation   . Respiratory failure   . Ischemic stroke   . Altered mental status, unspecified altered mental status type   . Closed pertrochanteric fracture of femur, left, initial encounter                  Medications:      Scheduled Meds:      calcium citrate-Vitamin D 2 tablet Oral Daily   carvedilol 6.25 mg Oral Q12H SCH   ceFAZolin 1 g Intravenous Q8H   famotidine 20 mg Oral Daily   Or      famotidine 20 mg Intravenous Daily   fluticasone-salmeterol 2 puff Inhalation BID   gabapentin 100 mg Oral TID   levothyroxine 112 mcg Oral Daily at 0600   multivitamin-minerals-folic acid-coenzyme q10 1 tablet per OG tube Daily   senna-docusate 2 tablet Oral BID   simvastatin 10 mg Oral QHS   warfarin 5 mg Oral Daily at 1800       Continuous Infusions:        Subjective:   Some L shoulder discomfort overnight.   Denies chest pain, palpitations.    Physical Exam:     VITAL SIGNS PHYSICAL EXAM   Temp:  [96.4 F (35.8 C)-98.1 F (36.7 C)] 97.9 F (36.6 C)  Heart Rate:  [62-86] 86  Resp Rate:  [16-20] 20  BP: (98-146)/(56-88) 135/88  Temp (24hrs), Avg:97.6 F (36.4 C), Min:96.4 F (35.8 C), Max:98.1 F (36.7 C)  Telemetry: ApVs with fourteen 3beat MM NSVT events overnight, now ApVs with bigeminy @ 84 bpm.       Intake/Output Summary (Last 24 hours) at 05/26/16 2706  Last data filed at 05/25/16 2323   Gross per 24 hour   Intake              540 ml   Output                0 ml   Net              540 ml    Physical Exam  General: awake, alert, breathing comfortable, no acute distress  Head: normocephalic  Eyes: EOM's intact  Cardiovascular: regular rate and rhythm, S1, S2, no S3, no S4, no murmurs, rubs or gallops  Neck: no carotid bruits or JVD  Lungs: clear to auscultation bilaterally, without wheezing, rhonchi, or rales  Abdomen: soft, non-tender, non-distended; normoactive bowel sounds  Extremities: no edema  Pulse: bilateral radial pulses 2+   Neurological: Alert and oriented X3, mood and affect normal  Musculoskeletal:  Good strength and tone       Device:  Manufacturer: St Jude  Model: Ellipse DR   Implant date: 4 Dec 17  Post Op date: 1    Reason for evaluation: POD1    Device Parameters  Mode: DDDR  Lower  rate: 70 bpm.  Upper rate: 120 bpm  Atrial output:  3.5 V @  0.5 ms  Atrial sensitivity: Auto mV  Ventricular output:   RV: 3.5 V @  0.5 ms  Ventricular sensitivity: RV:  Auto mV    Device Measurements  Atrial lead impedance: 380 ohms  Atrial capture threshold: 0.75 V @ 0.5 ms   Atrial sensing - P wave: 2.1 mV  Ventricular lead impedance:   RV: 410 ohms         RV Defib: 45 ohms  Ventricular capture threshold:   RV: 0.5 V @ 0.5 ms   Ventricular sensing - R wave: 11.9 mV  Underlying rhythm: NSR with bigeminy    Changes/Observations  Changes made: None.  Observations: Normal device function.  PVC burden overnight of 2.1%.      Incision:C/D/I without hematoma, discharge, or TTP    CXR:  4 Dec 17 - New Left sided ICD and leads.  Left basilar atelectasis and obscuration of both costophrenic angles.  No pneumothorax.    Labs:             Recent Labs  Lab 05/22/16  0413   Magnesium 2.2       Recent Labs  Lab 05/26/16  0448  05/19/16  1800   PT 14.8 More results in Results Review 39.5*   PT INR 1.2* More results in Results Review 4.2*   PTT  --   --  84*   More results in Results Review = values in this interval not displayed.    Recent Labs  Lab 05/26/16  0448 05/25/16  0227 05/24/16  0329   WBC 6.77 7.56 5.40   Hgb 8.0* 8.7* 8.2*   Hematocrit 25.6* 27.0* 25.4*   Platelets 141 149 130*       Recent Labs  Lab 05/25/16  0228 05/24/16  0329 05/23/16  0423   Sodium 137 140 139   Potassium 4.1 4.1 3.9   Chloride 108 109 110   CO2 21* 21* 22   BUN 15.0 20.0* 26.0*   Creatinine 0.7 0.7 0.7   EGFR >  60.0 >60.0 >60.0   Glucose 105* 86 94   Calcium 8.0 7.8* 7.7*     .No results found for: BNP   Estimated Creatinine Clearance: 58.1 mL/min (based on SCr of 0.7 mg/dL).  Diagnostics:     Signed by     Theo Dills, PA-C    [facility]  [facility]    Arrhythmia Spectralink [phone] (8am-4:30pm)  After hours, non urgent consult line [phone]  After Hours, urgent consults [phone]  For EP questions or consults during  the week, easiest to call Spectralink [phone]   You can also reach [place] 24/7 through our main EP office [phone].    My a/p as above. Doing well post icd implant. Normal dual icd interrogation as above. Incision cdi.  Thanks, Particia Lather, MD  [facility]  [phone]; [phone] FFX Spectralink.

## 2016-05-26 NOTE — Progress Notes (Addendum)
[place] [facility] PROGRESS NOTE  [facility]      Date Time: 05/26/16 9:32 AM  Patient Name: [name], [name]  Medical Record #:  [id number]  Account#:  [id number]  Admission Date:  05/18/2016             Subjective:   Denies chest pain, SOB or palpitations.  Appears euvolemic on exam    Assessment:    S/p LOC with traumatic left hip fracture presumably due to VT   Non-ischemic CM - chronic systolic heart failure compensated at present   Post transeptal ASD with left to right shunt qp/qs 1.6   S/p afib ablation   Torsade type VT with QT prolongation. Tikosyn discontinued.   S/p Dual chamber ICD 05/25/16      Recommendations:    Resume home lisinopril and furosemide for cardiomyopathy   Discussed with EP - plan to increase Coreg to 12.5mg  BID          Medications:      Scheduled Meds:      calcium citrate-Vitamin D 2 tablet Oral Daily   carvedilol 6.25 mg Oral Q12H SCH   ceFAZolin 1 g Intravenous Q8H   famotidine 20 mg Oral Daily   Or      famotidine 20 mg Intravenous Daily   fluticasone-salmeterol 2 puff Inhalation BID   gabapentin 100 mg Oral TID   levothyroxine 112 mcg Oral Daily at 0600   multivitamin-minerals-folic acid-coenzyme q10 1 tablet per OG tube Daily   senna-docusate 2 tablet Oral BID   simvastatin 10 mg Oral QHS   warfarin 5 mg Oral Daily at 1800       Continuous Infusions:            Physical Exam:       VITAL SIGNS PHYSICAL EXAM   Vitals:    05/26/16 0740   BP: 135/88   Pulse: 86   Resp: 20   Temp: 97.9 F (36.6 C)   SpO2: 96%     Temp (24hrs), Avg:97.6 F (36.4 C), Min:96.4 F (35.8 C), Max:98.1 F (36.7 C)      Telemetry: Reviewed SR, PVCs, couplets, bigeminy, trigeminy      Intake/Output Summary (Last 24 hours) at 05/26/16 0932  Last data filed at 05/25/16 2323   Gross per 24 hour   Intake              540 ml   Output                0 ml   Net              540 ml    Physical Exam  General: awake, alert, breathing comfortably, no acute distress  Head: normocephalic  Eyes: EOM's  intact  Cardiovascular: regular rate and rhythm, normal S1, S2, no S3, no S4, no murmurs, rubs or gallops  Neck: no carotid bruits or JVD  Lungs: clear to auscultation bilateraly, without wheezing, rhonchi, or rales  Abdomen: soft, non-tender, non-distended; no palpable masses,  normoactive bowel sounds  Extremities: no edema  Pulse: equal pulses, 4/4 symmetric  Neurological: Alert and oriented X3, mood and affect normal  Musculoskeletal: normal strength and tone         Labs:     Recent Labs  Lab 05/20/16  1248 05/19/16  1515   Troponin I 0.15* 0.24*               Recent Labs  Lab 05/24/16  0329   Bilirubin,  Total 1.3*   Protein, Total 5.2*   Albumin 2.9*   ALT 28   AST (SGOT) 35*       Recent Labs  Lab 05/22/16  0413   Magnesium 2.2       Recent Labs  Lab 05/26/16  0448  05/19/16  1800   PT 14.8 More results in Results Review 39.5*   PT INR 1.2* More results in Results Review 4.2*   PTT  --   --  84*   More results in Results Review = values in this interval not displayed.    Recent Labs  Lab 05/26/16  0448 05/25/16  0227 05/24/16  0329   WBC 6.77 7.56 5.40   Hgb 8.0* 8.7* 8.2*   Hematocrit 25.6* 27.0* 25.4*   Platelets 141 149 130*       Recent Labs  Lab 05/25/16  0228 05/24/16  0329 05/23/16  0423   Sodium 137 140 139   Potassium 4.1 4.1 3.9   Chloride 108 109 110   CO2 21* 21* 22   BUN 15.0 20.0* 26.0*   Creatinine 0.7 0.7 0.7   EGFR >60.0 >60.0 >60.0   Glucose 105* 86 94   Calcium 8.0 7.8* 7.7*           Invalid input(s): FREET4    .No results found for: BNP     Weight Monitoring 03/05/2016 04/15/2016 04/17/2016 05/18/2016 05/18/2016 05/19/2016 05/19/2016   Height 157.5 cm 157.5 cm 157.5 cm 160 cm 162.6 cm - 162.6 cm   Height Method Stated - Stated Stated - - -   Weight 65.318 kg 63.504 kg 63.549 kg 63.5 kg - 66.5 kg -   Weight Method Stated - Standing Scale Estimated - Bed Scale -   BMI (calculated) 26.4 kg/m2 25.7 kg/m2 25.7 kg/m2 24.9 kg/m2 - - -       Imaging:        ____________________________________________    Signed by: Queen Slough, NP    Patient seen and examined, RRR, my assessment and plans as noted above.    Signed by    Donnie Mesa, MD      [facility]  NP Spectralink [phone] (8am-5pm)  MD Spectralink [phone] or [phone] (8am-5pm)  Arrhythmia Spectralink [phone] (8am-4:30pm)  After hours, non urgent consult line [phone]  After Hours, urgent consults [phone]

## 2016-05-26 NOTE — Plan of Care (Signed)
Problem: Hemodynamic Status: Cardiac  Goal: Stable vital signs and fluid balance  Outcome: Progressing   05/26/16 1331   Goal/Interventions addressed this shift   Stable vital signs and fluid balance Monitor/assess vital signs and telemetry per unit protocol;Assess signs and symptoms associated with cardiac rhythm changes;Monitor lab values       Problem: Inadequate Tissue Perfusion  Goal: Adequate tissue perfusion will be maintained  Outcome: Progressing   05/26/16 1331   Goal/Interventions addressed this shift   Adequate tissue perfusion will be maintained Monitor/assess vital signs;Monitor/assess neurovascular status (pulses, capillary refill, pain, paresthesia, paralysis, presence of edema);Monitor for signs and symptoms of a pulmonary embolism (dyspnea, tachypnea, tachycardia, confusion)   PT. DENIES ANY PAIN OR SOB, v/s are in normal limit,  Pt. Is alert and oriented, pulses are positive and palpable, the both, right and left, access are clean, dry and intact. No bleeding or  Distress noted.

## 2016-05-27 DIAGNOSIS — I472 Ventricular tachycardia: Secondary | ICD-10-CM | POA: Diagnosis present

## 2016-05-27 DIAGNOSIS — I4721 Torsades de pointes: Secondary | ICD-10-CM | POA: Diagnosis present

## 2016-05-27 LAB — PT/INR
PT INR: 1.2 — ABNORMAL HIGH (ref 0.9–1.1)
PT: 15.4 s — ABNORMAL HIGH (ref 12.6–15.0)

## 2016-05-27 LAB — BASIC METABOLIC PANEL
BUN: 19 mg/dL (ref 7.0–19.0)
CO2: 23 mEq/L (ref 22–29)
Calcium: 8.1 mg/dL (ref 7.9–10.2)
Chloride: 108 mEq/L (ref 100–111)
Creatinine: 0.7 mg/dL (ref 0.6–1.0)
Glucose: 86 mg/dL (ref 70–100)
Potassium: 4 mEq/L (ref 3.5–5.1)
Sodium: 138 mEq/L (ref 136–145)

## 2016-05-27 LAB — CBC
Absolute NRBC: 0.02 10*3/uL — ABNORMAL HIGH
Hematocrit: 26.9 % — ABNORMAL LOW (ref 37.0–47.0)
Hgb: 8.5 g/dL — ABNORMAL LOW (ref 12.0–16.0)
MCH: 31.5 pg (ref 28.0–32.0)
MCHC: 31.6 g/dL — ABNORMAL LOW (ref 32.0–36.0)
MCV: 99.6 fL (ref 80.0–100.0)
MPV: 10.8 fL (ref 9.4–12.3)
Nucleated RBC: 0.3 /100 WBC (ref 0.0–1.0)
Platelets: 153 10*3/uL (ref 140–400)
RBC: 2.7 10*6/uL — ABNORMAL LOW (ref 4.20–5.40)
RDW: 15 % (ref 12–15)
WBC: 6.16 10*3/uL (ref 3.50–10.80)

## 2016-05-27 LAB — GFR: EGFR: >60

## 2016-05-27 MED ORDER — CARVEDILOL 12.5 MG PO TABS
12.5000 mg | ORAL_TABLET | Freq: Two times a day (BID) | ORAL | Status: DC
Start: 2016-05-27 — End: 2017-04-09

## 2016-05-27 MED ORDER — LIDOCAINE HCL (CARDIAC) 20 MG/ML IV SOLN
INTRAVENOUS | Status: DC
Start: 2016-05-27 — End: 2016-05-27
  Filled 2016-05-27: qty 5

## 2016-05-27 MED ORDER — ATROPINE SULFATE 0.1 MG/ML IJ SOLN (WRAP)
INTRAMUSCULAR | Status: DC
Start: 2016-05-27 — End: 2016-05-27
  Filled 2016-05-27: qty 10

## 2016-05-27 MED ORDER — EPINEPHRINE PF 1 MG/10ML IJ SOSY
PREFILLED_SYRINGE | INTRAMUSCULAR | Status: DC
Start: 2016-05-27 — End: 2016-05-27
  Filled 2016-05-27: qty 10

## 2016-05-27 MED ORDER — ACETAMINOPHEN 500 MG PO TABS
500.0000 mg | ORAL_TABLET | Freq: Four times a day (QID) | ORAL | Status: AC | PRN
Start: 2016-05-27 — End: ?

## 2016-05-27 NOTE — Progress Notes (Signed)
05/27/16 1702   Discharge Disposition   Patient preference/choice provided? Yes   Physical Discharge Disposition SNF   Receiving facility, unit and room number: [facility] Rm 116A    Nursing report phone number: [phone]   Facility fax number: [phone]   Mode of Transportation Ambulance   Pick up time 1930   Patient/Family/POA notified of transfer plan Yes   Patient agreeable to discharge plan/expected d/c date? Yes   Family/POA agreeable to discharge plan/expected d/c date? Yes  (pt and husband notified )   Bedside nurse notified of transport plan? Yes   Special requirements for patient during transport: (n/a)   Hard copy of narcotic RX sent with patient? N/A   Advanced directive/code status rescreen completed before discharge?  Yes   IV antibiotics post discharge? N/A   Wound care post discharge? N/A   Outpatient Services   Home Health (discharged to SNF rehab)   Services (n/a)   CM Interventions   Follow up appointment scheduled? No   Reason no follow up scheduled? (discharged to SNF rehab)   Referral made for home health RN visit? No, Other (comment)  (discharged to SNF rehab)   Multidisciplinary rounds/family meeting before d/c? Yes   Medicare Checklist   Patient received 1st IMM Letter? Yes   Medicaid/DMAS   Medicaid Pre-Screening completed No   DMAS 95 MI/MR completed and faxed No   DMAS 95 MI/MR trigger Level 2 Screening? No   [place] pre[place] completed? No   [place] form completed? No   [place] MI/MR completed? No        05/27/16 1702   Discharge Disposition   Patient preference/choice provided? Yes   Physical Discharge Disposition SNF   Receiving facility, unit and room number: [facility] Rm 116A    Nursing report phone number: [phone]   Facility fax number: [phone]   Mode of Transportation Ambulance   Pick up time 1930   Patient/Family/POA notified of transfer plan Yes   Patient agreeable to discharge plan/expected d/c date? Yes   Family/POA  agreeable to discharge plan/expected d/c date? Yes  (pt and husband notified )   Bedside nurse notified of transport plan? Yes   Special requirements for patient during transport: (n/a)   Hard copy of narcotic RX sent with patient? N/A   Advanced directive/code status rescreen completed before discharge?  Yes   IV antibiotics post discharge? N/A   Wound care post discharge? N/A   Outpatient Services   Home Health (discharged to SNF rehab)   Services (n/a)   CM Interventions   Follow up appointment scheduled? No   Reason no follow up scheduled? (discharged to SNF rehab)   Referral made for home health RN visit? No, Other (comment)  (discharged to SNF rehab)   Multidisciplinary rounds/family meeting before d/c? Yes   Medicare Checklist   Patient received 1st IMM Letter? Yes   Medicaid/DMAS   Medicaid Pre-Screening completed No   DMAS 95 MI/MR completed and faxed No   DMAS 95 MI/MR trigger Level 2 Screening? No   [place] pre[place] completed? No   [place] completed? No   [place] MI/MR completed? No

## 2016-05-27 NOTE — Plan of Care (Signed)
Problem: Hemodynamic Status: Cardiac  Goal: Stable vital signs and fluid balance  Outcome: Progressing  Pt a&o x4, resting in bed with eyes closed during the night. Meds given per MAR. Pain meds given and effective.  No acute distress or c/o SOB, on room air, see doc flow sheets. Bil hips bruised. Left hip incision intact, moderated  sanguinous drainage draining from incision.   Dressing changed with gauze and transparent film tape. Swelling/ +2 edema noted to Left hip. Continent of b;ladder voiding yellow urine.   Left upper chest wall incision site CDI, no firmness/ bruising noted. VSS A pace on monitor. Call bell in reach.

## 2016-05-27 NOTE — Progress Notes (Signed)
Pt discharging to [facility] SNF rehab tonight via pTS ambulance transport at 1930.  Pt / pts spouse notified / agreeable w/ discharge plan.  Pt assigned torm 116A, Report # [id number] [phone], Fax # [phone].    PTS stretcher transport arranged for 1930 tonight.     Cory Roughen, RN, BSN  Clinical Case Manager  [facility]  [phone]

## 2016-05-27 NOTE — Progress Notes (Signed)
Report given to [facility] at [facility].

## 2016-05-27 NOTE — Progress Notes (Signed)
NEUROLOGY PROGRESS NOTE    Date Time: 05/27/16 10:05 AM  Patient Name: [name],[name]  Attending Physician: Shawnee Knapp, MD    BRIEF HISTORY:   REASON FOR CONSULTATION:  Unwitnessed fall, altered mental status, rule out stroke.     HISTORY OF PRESENT ILLNESS:  The patient is an 82 year old woman with history of prior stroke, mitral  regurgitation, atrial fibrillation on Coumadin, congestive heart failure  who comes into the hospital with unwitnessed fall, left-sided weakness,  concerned about stroke.  The patient is currently intubated but alert.   History is mostly from medical records.  The patient was watching TV with  her husband last night when she went to another room; 30 to 40 minutes  later husband went to check on her and found her lying on the floor.  She  was unconscious and would not answer.  EMS was called.  The patient was  noted to have wide complex tachycardia en route.  This was suspected as  ventricular tachycardia.  She was cardioverted x2.  The patient in the ED  had NIH stroke scale of 21.  CT brain scan negative for any bleed, no TPA  was administered due to her INR of 3.7.  The patient currently is more  awake, responsive, denies pain.  She is intubated in the ICU.         Chief Complaints:   Left leg pain, Post surgery  Left hip fracture  EP eval for arrhythmia   ICD placed      Interval History/24 hour events:   In ICAR 36   Denies headache or confusion  Likely syncope from cardiac arrhythmia caused fall with left hip fracture  MRI cancelled as low probability of stroke  Overall much better  No headache or confusion       Review Of Systems:  No headaches, dizziness, confusion, focal weakness or numbness  No deafness, tinnitus, blurred vision, or vision loss, No double vision or droopy lids  No speech or swallowing problems  Balance poor,+ Falls  No bowel or bladder incontinence  No fever or chills        Physical Exam:     Vitals:    05/27/16 0800   BP: 126/60   Pulse: 69   Resp: 20    Temp: 98.4 F (36.9 C)   SpO2:        Intake and Output Summary (Last 24 hours) at Date Time    Intake/Output Summary (Last 24 hours) at 05/27/16 1005  Last data filed at 05/26/16 1300   Gross per 24 hour   Intake              240 ml   Output                0 ml   Net              240 ml       General: No apparent distress, comfortable appearing, vital signs noted above  Head: Mild bruise right temple and normocephalic  Eyes: Anicteric sclerae, moist conjunctiva, PERRLA   ENT: Oropharynx clear; no erythema or exudates;   Neck: supple; FROM; no mass   Extremities: No clubbing, cyanosis or edema, left hip fracture  CV: Regular rate and rhythm, no murmurs  Lungs: clear  Skin: No rash, lesions, ulcers   Neurological Examination  Mental Status:   Alert, oriented, to time, place, person and situation, confused about surgery   Clear speech, normal  language, including fluency, naming,     Normal short term and remote memory.  Adequate knowledge.   Affect appropriate   CN 2-12    EOM full, PERL, Normal fields and fundi, Face symmetric, tongue midline with normal bulk and no fasicualtions  Hearing, facial sensations and Gag reflex intact   Motor system: Normal tone, bulk, no involuntary movements.     Power 4/5.  No cog wheeling, tremors, or bradykinesia.   Sensory system: Normal touch, pin prick vibration in all 4 extremities; no sensory level   Reflexes:  Deep tendon reflexes symmetric, Negative Babinski  Cerebellar:  Mild tremor  Gait: Not tested  Neurovascular: No bruits.  Signs of meningeal irritation: None        Other pertinent positive findings:       Meds:     Current Facility-Administered Medications   Medication Dose Route Frequency   . calcium citrate-Vitamin D  2 tablet Oral Daily   . carvedilol  12.5 mg Oral Q12H SCH   . famotidine  20 mg Oral Daily    Or   . famotidine  20 mg Intravenous Daily   . fluticasone-salmeterol  2 puff Inhalation BID   . gabapentin  100 mg Oral TID   . levothyroxine  112 mcg Oral  Daily at 0600   . multivitamin-minerals-folic acid-coenzyme q10  1 tablet per OG tube Daily   . simvastatin  10 mg Oral QHS   . warfarin  5 mg Oral Daily at 1800       Labs:     Results     Procedure Component Value Units Date/Time    Basic metabolic panel [[id number]] Collected:  05/27/16 0559    Specimen:  Blood Updated:  05/27/16 0649     Glucose 86 mg/dL      BUN 40.9 mg/dL      Creatinine 0.7 mg/dL      Calcium 8.1 mg/dL      Sodium 811 mEq/L      Potassium 4.0 mEq/L      Chloride 108 mEq/L      CO2 23 mEq/L     GFR [[id number]] Collected:  05/27/16 0559     Updated:  05/27/16 0649     EGFR >60.0    Prothrombin time/INR [[id number]]  (Abnormal) Collected:  05/27/16 0559    Specimen:  Blood Updated:  05/27/16 0640     PT 15.4 (H) sec      PT INR 1.2 (H)     PT Anticoag. Given Within 48 hrs. warfarin ([place]    CBC [[id number]]  (Abnormal) Collected:  05/27/16 0559    Specimen:  Blood from Blood Updated:  05/27/16 0632     WBC 6.16 x10 3/uL      Hgb 8.5 (L) g/dL      Hematocrit 41.3 (L) %      Platelets 153 x10 3/uL      RBC 2.70 (L) x10 6/uL      MCV 99.6 fL      MCH 31.5 pg      MCHC 31.6 (L) g/dL      RDW 15 %      MPV 10.8 fL      Nucleated RBC 0.3 /100 WBC      Absolute NRBC 0.02 (H) x10 3/uL             Imaging personally reviewed, including:     Radiology Results (24 Hour)     Procedure Component Value Units  Date/Time    ULTRASOUND SCAN [[id number]] Resulted:  05/26/16 1129    Order Status:  Completed Updated:  05/26/16 1129            Assessment:     Patient Active Problem List   Diagnosis   . Celiac disease   . Scoliosis   . Atrial fibrillation   . Mitral valve disorder   . Disease of tricuspid valve   . Chronic airway obstruction, not elsewhere classified   . AF (atrial fibrillation)   . Paroxysmal a-fib   . CHF (congestive heart failure)   . Ischemic embolic stroke   . Decreased diffusion capacity   . PFO (patent foramen ovale)   . Acquired hypothyroidism   . Chronic systolic heart failure   . PAH (pulmonary  artery hypertension)   . Non-rheumatic mitral regurgitation   . Respiratory failure   . Ischemic stroke   . Altered mental status, unspecified altered mental status type   . Closed pertrochanteric fracture of femur, left, initial encounter     Fall  Syncope, cardiac arrhythmia   Left hip fracture  Coagulopathy  A fib post ablation   Torsades type VT   ICD placed    Plan/Recommendations:   General measures:  Diet/activity/Precuations: Per routine from admitting orders  Symptomatic measures: as needed  Diagnostics:Reviewed CT/CTA, ICD placed  Medications:Noted  Consultation:Ortho for hip surgery,  Physical/Occupational/Speech Therapy:As needed  Patient/Family Education/Counseling:Provided        Anticipated discharge disposition and date: Unknown     Case discussed with: Nursing/Team        Signed by: Shawnee Knapp, MD

## 2016-05-27 NOTE — Plan of Care (Signed)
Sp PPM insertion, site is c/d/i, no signs of bleeding or hematoma noted. Pt denies pain but pt does state discomfort when turned. Will continue to monitor per protocol.

## 2016-05-27 NOTE — Progress Notes (Addendum)
[place] [facility] PROGRESS NOTE  [facility]      Date Time: 05/27/16 11:53 AM  Patient Name: [name], [name]  Medical Record #:  [id number]  Account#:  [id number]  Admission Date:  05/18/2016             Subjective:   Denies chest pain, SOB or palpitations.  Appears euvolemic on exam    Assessment:    S/p LOC with traumatic left hip fracture presumably due to VT   Non-ischemic CM - chronic systolic heart failure compensated at present   Post transeptal ASD with left to right shunt qp/qs 1.6   S/p afib ablation   Torsade type VT with QT prolongation. Tikosyn discontinued.   S/p Dual chamber ICD 05/25/16      Recommendations:    Continue carvedilol, lisinopril, and furosemide for cardiomyopathy. Her home lisinopril and furosemide were not ordered on admit, please resume today or at discharge.    Stable from a general cardiology standpoint. Will sign off.    Follow up in our [place] office in 1-2 weeks.    ICD follow up as per EP.     ATTENDING ADDENDUM  Patient seen and examined, s/p ICD for pVT, site healing well, on titrated BB, occ PVC/bigeminy.  On meds for CM and resumed OAC, euvolemic, no edema. OK for discharge. Please reconsult as needed.    Johnell Comings MD FACC.      Medications:      Scheduled Meds:        calcium citrate-Vitamin D 2 tablet Oral Daily   carvedilol 12.5 mg Oral Q12H SCH   famotidine 20 mg Oral Daily   Or      famotidine 20 mg Intravenous Daily   fluticasone-salmeterol 2 puff Inhalation BID   gabapentin 100 mg Oral TID   levothyroxine 112 mcg Oral Daily at 0600   multivitamin-minerals-folic acid-coenzyme q10 1 tablet per OG tube Daily   simvastatin 10 mg Oral QHS   warfarin 5 mg Oral Daily at 1800       Continuous Infusions:            Physical Exam:       VITAL SIGNS PHYSICAL EXAM   Vitals:    05/27/16 0800   BP: 126/60   Pulse: 69   Resp: 20   Temp: 98.4 F (36.9 C)   SpO2:      Temp (24hrs), Avg:97.8 F (36.6 C), Min:97.2 F (36.2 C), Max:98.4 F (36.9  C)      Telemetry: Reviewed SR, PVCs, couplets, bigeminy, trigeminy      Intake/Output Summary (Last 24 hours) at 05/27/16 1153  Last data filed at 05/26/16 1300   Gross per 24 hour   Intake              240 ml   Output                0 ml   Net              240 ml    Physical Exam  General: awake, alert, breathing comfortably, no acute distress  L chest wall ICD site with no s/s of infection.   Head: normocephalic  Eyes: EOM's intact  Cardiovascular: regular rate and rhythm, normal S1, S2, no S3, no S4, no murmurs, rubs or gallops  Neck: no carotid bruits or JVD  Lungs: clear to auscultation bilateraly, without wheezing, rhonchi, or rales  Abdomen: soft, non-tender, non-distended; no palpable masses,  normoactive bowel  sounds  Extremities: no edema  Pulse: equal pulses, 4/4 symmetric  Neurological: Alert and oriented X3, mood and affect normal  Musculoskeletal: normal strength and tone         Labs:       Recent Labs  Lab 05/20/16  1248   Troponin I 0.15*               Recent Labs  Lab 05/24/16  0329   Bilirubin, Total 1.3*   Protein, Total 5.2*   Albumin 2.9*   ALT 28   AST (SGOT) 35*       Recent Labs  Lab 05/22/16  0413   Magnesium 2.2       Recent Labs  Lab 05/27/16  0559   PT 15.4*   PT INR 1.2*       Recent Labs  Lab 05/27/16  0559 05/26/16  0448 05/25/16  0227   WBC 6.16 6.77 7.56   Hgb 8.5* 8.0* 8.7*   Hematocrit 26.9* 25.6* 27.0*   Platelets 153 141 149       Recent Labs  Lab 05/27/16  0559 05/25/16  0228 05/24/16  0329   Sodium 138 137 140   Potassium 4.0 4.1 4.1   Chloride 108 108 109   CO2 23 21* 21*   BUN 19.0 15.0 20.0*   Creatinine 0.7 0.7 0.7   EGFR >60.0 >60.0 >60.0   Glucose 86 105* 86   Calcium 8.1 8.0 7.8*           Invalid input(s): FREET4    .No results found for: BNP     Weight Monitoring 03/05/2016 04/15/2016 04/17/2016 05/18/2016 05/18/2016 05/19/2016 05/19/2016   Height 157.5 cm 157.5 cm 157.5 cm 160 cm 162.6 cm - 162.6 cm   Height Method Stated - Stated Stated - - -   Weight 65.318 kg  63.504 kg 63.549 kg 63.5 kg - 66.5 kg -   Weight Method Stated - Standing Scale Estimated - Bed Scale -   BMI (calculated) 26.4 kg/m2 25.7 kg/m2 25.7 kg/m2 24.9 kg/m2 - - -       Imaging:       ____________________________________________    Signed by: Queen Slough, NP          [facility]  NP Spectralink [phone] (8am-5pm)  MD Spectralink [phone] or [phone] (8am-5pm)  Arrhythmia Spectralink [phone] (8am-4:30pm)  After hours, non urgent consult line [phone]  After Hours, urgent consults [phone]

## 2016-05-27 NOTE — Discharge Instructions (Signed)
Interventional Cardiovascular Admission and Recovery  EP Discharge Instructions  Pacemaker and/or ICD Device Implant    ACTIVITY:  1. Do not drive until instructed by your doctor.  Discuss at your follow-up appointment.  2. Rest today and tomorrow, gradually increasing to your usual activities.  3. Limited activities for 4 weeks.   a. Do not raise your arm on the incision side above shoulder level. This gives the device lead wires time to attach securely inside your body.  b. No lifting anything greater than 10 pounds on the incision side.  4. Ask your doctor when you can return to work.      WOUND/INCISION CARE:  1. If the bandage was not removed in the hospital, REMOVE it within 24 hours of the procedure.  Do not remove the steri-strips. They will be removed at your follow-up visit.  2. You may shower daily keeping the incision area dry.  After 5 - 7 days you may gently wash the incision with soap and water and pat dry.  Do not submerge the incision site in water (bath tub, pool, etc) until it is completely healed.    3. If Dermabond was used to close the incision, you may shower daily.  See Dermabond instructions.   4. Do not apply any creams, powders, lotions or ointments to the site/s.  5. Do not rub, scrub, pick, scratch or even use a wash cloth over the site/s.  6. Observe for signs of infection:  redness, warmth, swelling, drainage, or temperature greater than 100 degrees F.  If you suspect infection call the doctor who performed the procedure.  7. You may have some tenderness.  May take ACETAMINOPHEN (Tylenol) if needed.  8. You may experience some mild bruising.  9. If you notice bleeding either at the incision site or underneath the skin, you should lay down flat and hold pressure on the area for 10 minutes and call your doctor.  If the bleeding does not stop, you should continue to hold pressure and call 911.  10. If your arm becomes cold, numb, painful, grayish in color, or change from usual  color/sensation occurs, you should call 911.    WHEN TO CALL THE DOCTOR:  (Call the doctor who placed your device)       1.  If you have signs of infection       2.  Dizziness, fainting       3. Chest pain, rapid pulse or shortness of breath       4. Chest muscle twitching or hiccups that won't stop       5. Unrelieved pain    ADDITIONAL INFORMATION:       1.  You will be given an ID card that contains information about your pacemaker.  Initially it will be a temporary card. A permanent card will be mailed to you within a few weeks.  Always carry this card with you.       2.  Antibiotics - May need pre-treatment for prevention of infection prior to any elective surgical or dental procedure.  Discuss with your doctor.       3. Keep your cell phone away from your device.  Don't carry the phone in your shirt pocket, even when it's turned off.       4. Avoid strong magnets.  Examples are those used in MRIs or in hand-held security wands.      5.  Refer to the device instruction manual for further instruction.

## 2016-05-27 NOTE — PT Progress Note (Signed)
[facility]   Physical Therapy Reassessment   Patient: [name]    MRN#: [id number]   [facility]  Bed: FI365/FI365-01    Discharge Recommendations:   Discharge Recommendation: SNF    DME Recommended for Discharge:  (TBD at rehab)    If the above recommended discharge disposition is not available patient will need Moderate assistance for ambulation and transfers, BSC, manual WC, shower chair equipment, transport into home and HHPT.     Assessment:   [name] is a 82 y.o. female admitted 05/18/2016.  Patient in bed upon arrival, amenable to physical therapy evaluation. Patient currently s/p ICD placement. Patient performs all functional mobility with MOD assist, constant cueing required for safety and hand placement. Patient educated on HEP exercises in seated and supine position.    Therapy Diagnosis: impaired functional mobility    Impairments:   Pain  Impaired Gait  Impaired Coordination  Impaired Balance  Decreased Strength  Decreased safety/judgement  Decreased ROM    Rehabilitation Potential: good    Treatment Activities: Reassessment, therapeutic exercise  Educated the patient to role of physical therapy, plan of care, goals of therapy and HEP, safety with mobility and ADLs.    Plan:   Treatment/Interventions: Exercise, Gait training, Neuromuscular re-education, Functional transfer training, LE strengthening/ROM, Endurance training, Patient/family training, Equipment eval/education, Bed mobility     PT Frequency: 2-3x/wk   Risks/Benefits/POC Discussed with Pt/Family: With patient/family        Reason for reassessment:  ICD placement, now with new PT orders.    Precautions and Contraindications:   Falls  WBAT LLE    Consult received for [name] for PT Evaluation and Treatment.  Patient's medical condition is appropriate for Physical therapy intervention at this time.    Medical Diagnosis: Elevated INR [R79.1]  Atrial fibrillation with RVR  [I48.91]  Altered mental status, unspecified altered mental status type [R41.82]  Hemiparesis of left nondominant side, unspecified hemiparesis etiology [G81.94]  Ischemic embolic stroke [I63.9]  Unresponsiveness [R41.89]      History of Present Illness:   Per H&P:  [name] is a 82 y.o. female admitted on 05/18/2016 with "They were watching a tv program together and [name] went to another room. about 30-40 minutes later, [name] went to check on her lying and found on the bathroom floor. "she was unconscious and would not answer", "I know she was breathing, I could see her breathing" he did not notice any spontaneous movement. EMS was called. earlier in the day she had one episode severe vomiting and decreased appetite throughout the day. She was in normal state of health prior to this."    Past Medical/Surgical History:  Past Medical History:   Diagnosis Date   . Abnormal vision     Dry eyes; marginal vision L eye   . Abnormal vision     Wears glasses   . Anemia     controlled on meds   . Aortic valve insufficiency    . Arrhythmia     Atrial fibrillation   . Arthritis     osteoarthritis, back and pelvis   . AVD (aortic valve disease)    . Benign heart murmur    . Bilateral cataracts     surgery, right eye   . Celiac disease    . Chronic obstructive pulmonary disease     mild-mod   . Difficulty walking     Back pain;    .  Disease of lung    . Disorder of musculoskeletal system ?    ?   Marland Kitchen Dyspnea on exertion    . Ear, nose and throat disorder     Hoarseness   . Encounter for cardioversion procedure 10/31/2013   . Gastroesophageal reflux disease    . GERD (gastroesophageal reflux disease)     occasional , asymptomatic   . Headache     infrequently   . Heart murmur    . Hyperlipidemia     controlled with meds   . Hypertension     controlled w/meds   . Hypothyroidism     controlled with meds   . Low back pain    . Mitral valve insufficiency    . Mitral valve prolapse    . Orthopedic aftercare      scoliosis, stenosis, athritis   . Osteoporosis    . Pneumonia     had pneumonia shot 2010 and 2015   . Primary cardiomyopathy    . Scoliosis    . Shortness of breath    . Thyroid disease    . Vein disorder     bilateral legs       X-Rays/Tests/Labs:    XR Left Femur  IMPRESSION:   Acute intratrochanteric fracture of the left femur. The remainder is as  above.   05/19/2016 8:00 PM    Social History:   Prior Level of Function:  Prior level of function: Independent with ADLs, Ambulates independently  Baseline Activity Level: Community ambulation  DME Currently at Home: Single point cane, Front wheel walker    Home Living Arrangements:  Living Arrangements: Spouse/significant other  Type of Home: House  Home Layout: Performs ADL's on one level, One level, Stairs to enter without rails (add number in comment) (2)  DME Currently at Home: Single point cane, Front wheel walker  Home Living - Notes / Comments:  (Husband home to assist)    Subjective: "You're going to have to remind me"   Patient is agreeable to participation in the therapy session. Nursing clears patient for therapy.     Patient goal: return to PLOF    Pain Assessment:  Numeric Scale (0-10): 8  Location: left hip  Intervention: Repositioned    Objective:   Observation of Patient:   Patient is in bed with telemetry in place.    Cognition/Neuro Status:  Behavior: Cooperative, Pleasant   Orientation Level: Person, Place  and Time  Safety Awareness: minimal verbal instruction    Musculoskeletal Examination:  Gross ROM  Right Upper Extremity ROM: within normal limits  Left Upper Extremity ROM: within normal limits  Right Lower Extremity ROM: within normal limits  Left Lower Extremity ROM: moderately limited    Gross Strength  Right Upper Extremity: 4/5  Left Upper Extremity: 4/5  Right Lower Extremity: 4/5  Left Lower Extremity: 3/5    Bed Mobility:     Supine to Sit: Moderate assist  Sit to Supine: Maximal assist    Transfers:  Sit to Stand: Moderate  assist  Stand to Sit: Moderate assist    Ambulation:  Distance: 2 Feet (left side stepping to [facility])  Device: Front wheel walker  Assist Level: Moderate assist  Pattern: decreased pace , decreased cadance     Therex (Seated):  LAQ's: 10 reps (bilaterally)  AP: 10 reps (bilaterally)  Hip flexion: 10 reps (bilaterally)    Therex (Supine):  Heel slides: 10 reps (bilaterally)  Hip abduction: 10 reps (bilaterally)  Balance:  Static sitting: good   Dynamic sitting: fair   Static standing: fair   Dynamic standing: poor    Sensation:  Right UE: intact   Left UE: intact   Right LE: intact   Left LE: intact     Participation and Activity Tolerance:  Participation Effort: good  Patient Endurance: fair    Patient left with call bell within reach, all needs met, and all questions answered    SCDs on: No (as found)  Fall mat in place: Yes  Bed alarm on: Yes    Goals:   Goals  Goal Formulation: With patient/family  Time for Goal Acheivement: 5 visits  Goals: Select goal  Pt Will Go Supine To Sit: with contact guard assist, to maximize functional mobility and independence  Pt Will Perform Sit to Stand: with contact guard assist, to maximize functional mobility and independence  Pt Will Transfer Bed/Chair: with minimal assist, to maximize functional mobility and independence  Pt Will Ambulate: 101-150 feet, with rolling walker, with minimal assist, to maximize functional mobility and independence  Pt Will Go Up / Down Stairs: 1-2 stairs, with minimal assist, With rail, to maximize functional mobility and independence    Time of treatment:   PT Received On: 05/27/16  Start Time: 1030  Stop Time: 1130  Time Calculation (min): 60 min    Daleen Squibb, PT, DPT

## 2016-05-27 NOTE — Plan of Care (Cosign Needed)
Skin assessment:    PPM left upper chest, c/d/i, no signs of bleeding or hematoma noted. Left leg dressingsX3, covered by gauze and tegaderm. Moderate bruising on left leg, small skin tear on upper sacrum area.

## 2016-05-27 NOTE — Progress Notes (Signed)
Discharge instructions given, all questions been addressed. Discontinued IVs and tele monitor.

## 2016-05-28 NOTE — Progress Notes (Signed)
Patient discharged.    No PCP follow up made due to patient discharge disposition to SNF per Discharge Order Info.    Amalia Greenhouse  Case Management Assistant  [phone]

## 2016-06-02 ENCOUNTER — Other Ambulatory Visit: Payer: Self-pay

## 2016-06-05 ENCOUNTER — Ambulatory Visit (INDEPENDENT_AMBULATORY_CARE_PROVIDER_SITE_OTHER): Payer: Medicare Other | Admitting: Orthopaedic Surgery

## 2016-06-05 ENCOUNTER — Ambulatory Visit (INDEPENDENT_AMBULATORY_CARE_PROVIDER_SITE_OTHER): Payer: Medicare Other

## 2016-06-05 ENCOUNTER — Encounter (INDEPENDENT_AMBULATORY_CARE_PROVIDER_SITE_OTHER): Payer: Self-pay | Admitting: Orthopaedic Surgery

## 2016-06-05 VITALS — BP 99/53 | HR 50 | Temp 98.0°F | Resp 14

## 2016-06-05 DIAGNOSIS — S72102A Unspecified trochanteric fracture of left femur, initial encounter for closed fracture: Secondary | ICD-10-CM

## 2016-06-05 NOTE — Progress Notes (Signed)
Orthpaedic Trauma Follow Up Visit    Chief Complaint   Patient presents with   . Hip Injury     16 days S/P CLOSED REDUCTION, INSERTION, HIP IM NAIL (GAMMA) DOS 05/20/2016       HPI: 82 year old female presents for follow-up 16 days status post intramedullary nail placement to left hip.  Patient relates that she has been having increased pain noted to the right lower extremity.  She currently resides at the [facility] where she undergoes PT and OT twice daily to improve strength and conditioning.  She relates sessions are difficult secondary to pain.  Patient is currently on a pain management of Tylenol and gabapentin with moderate relief.  Dors is ambulating approximately 40 feet with assist, however, requires wheelchair for long distances.  She denies any other complaints at this time.  She denies any nausea, fevers or chills.      PE:   Vitals:    06/05/16 1349   BP: 99/53   Pulse: (!) 50   Resp: 14   Temp: 98 F (36.7 C)     Patient is alert and oriented, well-nourished, frail-appearing.      Left lower extremity:  Staples intact, well coapted surgical site  Mild erythema surrounding proximal staples  SILT sp/dp/t  4/5 DF/PF/EHL secondary to pain  2+DP/PT pulses    Radiology: Xrays taken in clinic today: To assess operative site.  AP and lateral view of left hip; callus formation appreciated along intratrochanteric fracture.  Arteritis intact in near anatomic alignment  Joint spaces are well maintained.      Impression: 82 year old female presents for follow-up status post IM nail placement to left hip.      Plan:  Weightbearing as tolerated to left upper extremity.  Prescription given for physical therapy.  Staples removed on today's visits with application of Steri-Strips.  Follow up in 4 weeks with repeat imaging studies.    Stephania Fragmin  Dept of Orthopedics  Page [phone] or call [phone] for questions    Attending Addendum/Attestation:    I have personally seen and examined this patient  and have participated in their care. I agree with the clinical information, including the physical exam, patient history, and planning as documented by the Resident or Physician Assistant. In addition, I have edited this note to reflect my findings and plan as well as to incorporate any new data.      Chevis Pretty, MD  Orthopaedic Trauma  Pager [phone]  Office [phone]

## 2016-06-30 ENCOUNTER — Other Ambulatory Visit (INDEPENDENT_AMBULATORY_CARE_PROVIDER_SITE_OTHER): Payer: Self-pay

## 2016-06-30 ENCOUNTER — Ambulatory Visit (INDEPENDENT_AMBULATORY_CARE_PROVIDER_SITE_OTHER): Payer: Self-pay

## 2016-07-10 ENCOUNTER — Ambulatory Visit (INDEPENDENT_AMBULATORY_CARE_PROVIDER_SITE_OTHER): Payer: Medicare Other | Admitting: Orthopaedic Surgery

## 2016-07-10 ENCOUNTER — Ambulatory Visit (INDEPENDENT_AMBULATORY_CARE_PROVIDER_SITE_OTHER): Payer: Medicare Other

## 2016-07-10 ENCOUNTER — Encounter (INDEPENDENT_AMBULATORY_CARE_PROVIDER_SITE_OTHER): Payer: Self-pay | Admitting: Orthopaedic Surgery

## 2016-07-10 VITALS — BP 98/52 | HR 39 | Temp 97.3°F | Ht 64.0 in | Wt 135.0 lb

## 2016-07-10 DIAGNOSIS — S72102A Unspecified trochanteric fracture of left femur, initial encounter for closed fracture: Secondary | ICD-10-CM

## 2016-07-10 NOTE — Progress Notes (Signed)
Orthpaedic Trauma Follow Up Visit    Chief Complaint   Patient presents with   . Hip Injury     6 wks S/P CLOSED REDUCTION, INSERTION, HIP IM NAIL (GAMMA) DOS 05/20/2016       HPI: 83 year old female 6 weeks status post IM nail to left hip.  She has been undergoing physical therapy over the past several weeks.  She relates improved range of motion and has been able to transition from a wheelchair to a walker.   Her pain is well-controlled to operative site, however, she does endorse some new pain to her left knee.  Denies any numbness or tingling to digits.  She denies any fevers or chills.     In addition to findings reviewed in EPIC, relevant Medical/Family/Social History noted in today's HPI, otherwise are noncontributory to today's visit.    Physical Exam: Pt is a well developed, well nourished 83 y.o. year old female who is awake, alert, and oriented.  The patient has a normal affect and is in no acute distress.    Vitals:    07/10/16 1121   BP: 98/52   Pulse: (!) 39   Temp: 97.3 F (36.3 C)     Gait:Antalgic    Left focused exam:   Surgical incisions well-healed  SILT sp/dp/t  + DF/PF/EHL  Limited single leg raise due  Wiggles toes well  +DP/PT pulse  Calf compressible, non-tender  Cap refill less than 2 secs        Radiographic findings: Xrays taken in the office today: AP and lateral view of femur  Indications: To assess operative site  Findings: Intramedullary nail is intact and alignment is well maintained, no evidence of compromise.  Joint is anatomic.  Conclusion: Stable joint with reduction well maintained      Impression: 83 y.o. female  presents for follow-up 6 weeks status post left hip IM nail     Plan:   Continue physical therapy, new prescription given  Patient will follow-up in 6 weeks with repeat imaging studies      Stephania Fragmin  Dept of Orthopedics  Page [phone] or call [phone] for questions    Attending Addendum/Attestation:    Agree w/above.    Chevis Pretty, MD  Orthopaedic  Trauma  Pager [phone]  Office [phone]

## 2016-08-14 ENCOUNTER — Ambulatory Visit (INDEPENDENT_AMBULATORY_CARE_PROVIDER_SITE_OTHER): Payer: Self-pay | Admitting: Cardiology

## 2016-08-14 LAB — VAHRT HISTORIC LVEF: Ejection Fraction: 25 %

## 2016-08-21 ENCOUNTER — Ambulatory Visit (INDEPENDENT_AMBULATORY_CARE_PROVIDER_SITE_OTHER): Payer: Medicare Other | Admitting: Orthopaedic Surgery

## 2016-08-28 ENCOUNTER — Ambulatory Visit (INDEPENDENT_AMBULATORY_CARE_PROVIDER_SITE_OTHER): Payer: Medicare Other

## 2016-08-28 ENCOUNTER — Ambulatory Visit (INDEPENDENT_AMBULATORY_CARE_PROVIDER_SITE_OTHER): Payer: Medicare Other | Admitting: Orthopaedic Surgery

## 2016-08-28 ENCOUNTER — Encounter (INDEPENDENT_AMBULATORY_CARE_PROVIDER_SITE_OTHER): Payer: Self-pay | Admitting: Orthopaedic Surgery

## 2016-08-28 VITALS — BP 115/83 | HR 56 | Resp 18 | Wt 140.0 lb

## 2016-08-28 DIAGNOSIS — S72102A Unspecified trochanteric fracture of left femur, initial encounter for closed fracture: Secondary | ICD-10-CM

## 2016-08-28 NOTE — Progress Notes (Signed)
Orthpaedic Trauma Follow Up Visit    Chief Complaint   Patient presents with   . Hip Injury     3.5 months S/P CLOSED REDUCTION, INSERTION, HIP IM NAIL (GAMMA) DOS 05/20/2016       HPI: 83 year old female status post cephalo-medullary fixation of left femur.  Overall, the patient is doing fairly well.  She does complain of some mild pain around the left knee but is ambulating with a cane.  She has no other issues.    PE:   Vitals:    08/28/16 0922   BP: 115/83   Pulse: (!) 56   Resp: 18     Examination of her extremity reveals healed surgical incisions.  The patient is her rest intact distally.    Radiology: Xrays taken in clinic today: AP and lateral views of the left femur demonstrate satisfactory reduction and alignment of the patient's left peritrochanteric femur fracture with abundant callus formation noted.    Impression: 83 year old female status post cephalo-medullary fixation, left femur, doing well.    Plan:  Patient will continue weightbearing as tolerated.  I would like to see her in 3 months with x-rays at that visit.  She was given a physical therapy prescription today.

## 2016-09-11 ENCOUNTER — Ambulatory Visit (INDEPENDENT_AMBULATORY_CARE_PROVIDER_SITE_OTHER): Payer: Self-pay | Admitting: Cardiology

## 2016-09-18 ENCOUNTER — Ambulatory Visit (INDEPENDENT_AMBULATORY_CARE_PROVIDER_SITE_OTHER): Payer: Self-pay | Admitting: Nurse Practitioner

## 2016-09-25 ENCOUNTER — Ambulatory Visit (INDEPENDENT_AMBULATORY_CARE_PROVIDER_SITE_OTHER): Payer: Self-pay | Admitting: Cardiology

## 2016-09-29 ENCOUNTER — Other Ambulatory Visit (INDEPENDENT_AMBULATORY_CARE_PROVIDER_SITE_OTHER): Payer: Self-pay

## 2016-09-29 ENCOUNTER — Ambulatory Visit (INDEPENDENT_AMBULATORY_CARE_PROVIDER_SITE_OTHER): Payer: Self-pay

## 2016-09-29 ENCOUNTER — Ambulatory Visit (INDEPENDENT_AMBULATORY_CARE_PROVIDER_SITE_OTHER): Payer: Self-pay | Admitting: Cardiovascular Disease

## 2016-10-02 ENCOUNTER — Ambulatory Visit (INDEPENDENT_AMBULATORY_CARE_PROVIDER_SITE_OTHER): Payer: Self-pay | Admitting: Cardiology

## 2016-10-05 ENCOUNTER — Other Ambulatory Visit: Payer: Self-pay | Admitting: Cardiology

## 2016-10-05 DIAGNOSIS — I341 Nonrheumatic mitral (valve) prolapse: Secondary | ICD-10-CM

## 2016-10-06 ENCOUNTER — Ambulatory Visit
Admission: RE | Admit: 2016-10-06 | Discharge: 2016-10-06 | Disposition: A | Discharge status: Home / Self Care | Payer: Medicare Other | Source: Ambulatory Visit | Attending: Cardiology | Admitting: Cardiology

## 2016-10-06 DIAGNOSIS — I340 Nonrheumatic mitral (valve) insufficiency: Secondary | ICD-10-CM | POA: Insufficient documentation

## 2016-10-06 DIAGNOSIS — I341 Nonrheumatic mitral (valve) prolapse: Secondary | ICD-10-CM

## 2016-10-06 DIAGNOSIS — I517 Cardiomegaly: Secondary | ICD-10-CM | POA: Insufficient documentation

## 2016-10-06 DIAGNOSIS — I5189 Other ill-defined heart diseases: Secondary | ICD-10-CM | POA: Insufficient documentation

## 2016-10-07 ENCOUNTER — Ambulatory Visit (INDEPENDENT_AMBULATORY_CARE_PROVIDER_SITE_OTHER): Payer: Self-pay | Admitting: Cardiovascular Disease

## 2016-10-12 ENCOUNTER — Encounter: Payer: Self-pay | Admitting: Cardiovascular Disease

## 2016-10-13 ENCOUNTER — Encounter (INDEPENDENT_AMBULATORY_CARE_PROVIDER_SITE_OTHER): Payer: Self-pay

## 2016-10-27 ENCOUNTER — Other Ambulatory Visit (INDEPENDENT_AMBULATORY_CARE_PROVIDER_SITE_OTHER): Payer: Self-pay

## 2016-10-27 ENCOUNTER — Ambulatory Visit (INDEPENDENT_AMBULATORY_CARE_PROVIDER_SITE_OTHER): Payer: Self-pay | Admitting: Physician Assistant

## 2016-10-27 ENCOUNTER — Ambulatory Visit (INDEPENDENT_AMBULATORY_CARE_PROVIDER_SITE_OTHER): Payer: Self-pay

## 2016-10-29 ENCOUNTER — Ambulatory Visit: Payer: Medicare Other | Attending: Cardiovascular Disease

## 2016-10-29 DIAGNOSIS — Z1589 Genetic susceptibility to other disease: Secondary | ICD-10-CM

## 2016-10-29 LAB — CBC AND DIFFERENTIAL
Absolute NRBC: 0 10*3/uL
Basophils Absolute Automated: 0.01 10*3/uL (ref 0.00–0.20)
Basophils Automated: 0.2 %
Eosinophils Absolute Automated: 0.08 10*3/uL (ref 0.00–0.70)
Eosinophils Automated: 2 %
Hematocrit: 38.3 % (ref 37.0–47.0)
Hgb: 11.9 g/dL — ABNORMAL LOW (ref 12.0–16.0)
Immature Granulocytes Absolute: 0.01 10*3/uL
Immature Granulocytes: 0.2 %
Lymphocytes Absolute Automated: 1.06 10*3/uL (ref 0.50–4.40)
Lymphocytes Automated: 26.2 %
MCH: 30.7 pg (ref 28.0–32.0)
MCHC: 31.1 g/dL — ABNORMAL LOW (ref 32.0–36.0)
MCV: 99 fL (ref 80.0–100.0)
MPV: 10.3 fL (ref 9.4–12.3)
Monocytes Absolute Automated: 0.31 10*3/uL (ref 0.00–1.20)
Monocytes: 7.7 %
Neutrophils Absolute: 2.57 10*3/uL (ref 1.80–8.10)
Neutrophils: 63.7 %
Nucleated RBC: 0 /100 WBC (ref 0.0–1.0)
Platelets: 140 10*3/uL (ref 140–400)
RBC: 3.87 10*6/uL — ABNORMAL LOW (ref 4.20–5.40)
RDW: 15 % (ref 12–15)
WBC: 4.04 10*3/uL (ref 3.50–10.80)

## 2016-10-29 LAB — BASIC METABOLIC PANEL
BUN: 26 mg/dL — ABNORMAL HIGH (ref 7.0–19.0)
CO2: 32 mEq/L — ABNORMAL HIGH (ref 21–29)
Calcium: 9.2 mg/dL (ref 7.9–10.2)
Chloride: 99 mEq/L — ABNORMAL LOW (ref 100–111)
Creatinine: 0.9 mg/dL (ref 0.4–1.5)
Glucose: 125 mg/dL — ABNORMAL HIGH (ref 70–100)
Potassium: 3.3 mEq/L — ABNORMAL LOW (ref 3.5–5.1)
Sodium: 141 mEq/L (ref 136–145)

## 2016-10-29 LAB — HEMOLYSIS INDEX: Hemolysis Index: 9 (ref 0–18)

## 2016-10-29 LAB — GFR: EGFR: 59.8

## 2016-10-29 NOTE — Pre-Procedure Instructions (Signed)
Reviewed 10/29/16 labs, no surgery posted.  Faxed to ordering MD

## 2016-11-02 ENCOUNTER — Ambulatory Visit (INDEPENDENT_AMBULATORY_CARE_PROVIDER_SITE_OTHER): Payer: Self-pay | Admitting: Cardiology

## 2016-11-05 ENCOUNTER — Ambulatory Visit: Payer: Medicare Other | Attending: Cardiology

## 2016-11-05 DIAGNOSIS — I493 Ventricular premature depolarization: Secondary | ICD-10-CM | POA: Insufficient documentation

## 2016-11-05 LAB — HEMOLYSIS INDEX: Hemolysis Index: 3 (ref 0–18)

## 2016-11-05 LAB — BASIC METABOLIC PANEL
BUN: 29 mg/dL — ABNORMAL HIGH (ref 7.0–19.0)
CO2: 30 mEq/L — ABNORMAL HIGH (ref 21–29)
Calcium: 9.3 mg/dL (ref 7.9–10.2)
Chloride: 99 mEq/L — ABNORMAL LOW (ref 100–111)
Creatinine: 1.1 mg/dL (ref 0.4–1.5)
Glucose: 96 mg/dL (ref 70–100)
Potassium: 3.4 mEq/L — ABNORMAL LOW (ref 3.5–5.1)
Sodium: 140 mEq/L (ref 136–145)

## 2016-11-05 LAB — MAGNESIUM: Magnesium: 1.9 mg/dL (ref 1.6–2.6)

## 2016-11-05 LAB — GFR: EGFR: 47.5

## 2016-11-06 ENCOUNTER — Encounter: Payer: Self-pay | Admitting: Cardiovascular Disease

## 2016-11-10 ENCOUNTER — Telehealth: Payer: Medicare Other

## 2016-11-10 NOTE — Pre-Procedure Instructions (Signed)
Procedure Verified: 11/11/2016  Pt ID verified  Arrival time at 0600   w read back , parking at [employer], check in at CATH/EP lab registration, IHVI grd flr   Nothing to eat and drink after 2300 tonight, w read back  May take morning meds w sips of water, Reinforced medication instructions by MD  Pt instructed to bring PM card and medication list dos  Labs done 11/05/2016 in epic   Ride arranged with husband Jonny Ruiz [phone]

## 2016-11-11 ENCOUNTER — Encounter
Admission: RE | Disposition: A | Discharge status: Home / Self Care | Payer: Self-pay | Source: Ambulatory Visit | Attending: Cardiovascular Disease

## 2016-11-11 ENCOUNTER — Encounter: Admission: RE | Payer: Self-pay | Source: Ambulatory Visit

## 2016-11-11 ENCOUNTER — Ambulatory Visit: Admission: RE | Admit: 2016-11-11 | Payer: Medicare Other | Source: Ambulatory Visit | Admitting: Cardiovascular Disease

## 2016-11-11 ENCOUNTER — Ambulatory Visit: Payer: Medicare Other | Admitting: Cardiovascular Disease

## 2016-11-11 ENCOUNTER — Ambulatory Visit
Admission: RE | Admit: 2016-11-11 | Discharge: 2016-11-12 | Disposition: A | Discharge status: Home / Self Care | Payer: Medicare Other | Source: Ambulatory Visit | Attending: Cardiovascular Disease | Admitting: Cardiovascular Disease

## 2016-11-11 DIAGNOSIS — E039 Hypothyroidism, unspecified: Secondary | ICD-10-CM | POA: Insufficient documentation

## 2016-11-11 DIAGNOSIS — Q211 Atrial septal defect, unspecified: Secondary | ICD-10-CM

## 2016-11-11 DIAGNOSIS — Z8674 Personal history of sudden cardiac arrest: Secondary | ICD-10-CM | POA: Insufficient documentation

## 2016-11-11 DIAGNOSIS — I472 Ventricular tachycardia: Secondary | ICD-10-CM | POA: Insufficient documentation

## 2016-11-11 DIAGNOSIS — I429 Cardiomyopathy, unspecified: Secondary | ICD-10-CM | POA: Insufficient documentation

## 2016-11-11 DIAGNOSIS — J449 Chronic obstructive pulmonary disease, unspecified: Secondary | ICD-10-CM | POA: Insufficient documentation

## 2016-11-11 DIAGNOSIS — K900 Celiac disease: Secondary | ICD-10-CM | POA: Insufficient documentation

## 2016-11-11 DIAGNOSIS — I420 Dilated cardiomyopathy: Secondary | ICD-10-CM

## 2016-11-11 DIAGNOSIS — Z8774 Personal history of (corrected) congenital malformations of heart and circulatory system: Secondary | ICD-10-CM | POA: Insufficient documentation

## 2016-11-11 DIAGNOSIS — I5022 Chronic systolic (congestive) heart failure: Secondary | ICD-10-CM | POA: Insufficient documentation

## 2016-11-11 DIAGNOSIS — I2721 Secondary pulmonary arterial hypertension: Secondary | ICD-10-CM | POA: Insufficient documentation

## 2016-11-11 DIAGNOSIS — I480 Paroxysmal atrial fibrillation: Secondary | ICD-10-CM | POA: Insufficient documentation

## 2016-11-11 DIAGNOSIS — Z8673 Personal history of transient ischemic attack (TIA), and cerebral infarction without residual deficits: Secondary | ICD-10-CM | POA: Insufficient documentation

## 2016-11-11 SURGERY — PERCUTANEOUS ASD CLOSURE W/ IMPLANT - INCL. RHC
Anesthesia: Choice

## 2016-11-11 SURGERY — LHC W/ CORONARY ANGIOS AND LV
Anesthesia: Conscious Sedation | Laterality: Left

## 2016-11-11 MED ORDER — HEPARIN SODIUM (PORCINE) 1000 UNIT/ML IJ SOLN
INTRAMUSCULAR | Status: AC
Start: 2016-11-11 — End: 2016-11-11
  Administered 2016-11-11: 08:00:00 7000 [IU] via INTRAVENOUS
  Administered 2016-11-11: 08:00:00 3000 [IU] via INTRAVENOUS
  Filled 2016-11-11: qty 10

## 2016-11-11 MED ORDER — FENTANYL CITRATE (PF) 50 MCG/ML IJ SOLN (WRAP)
INTRAMUSCULAR | Status: AC
Start: 2016-11-11 — End: 2016-11-11
  Administered 2016-11-11: 08:00:00 25 ug via INTRAVENOUS
  Filled 2016-11-11: qty 2

## 2016-11-11 MED ORDER — CEFAZOLIN SODIUM 1 G IJ SOLR
INTRAMUSCULAR | Status: AC
Start: 2016-11-11 — End: 2016-11-11
  Administered 2016-11-11: 08:00:00 1 g via INTRAVENOUS
  Filled 2016-11-11: qty 1000

## 2016-11-11 MED ORDER — LEVOTHYROXINE SODIUM 112 MCG PO TABS
112.0000 ug | ORAL_TABLET | Freq: Every day | ORAL | Status: DC
Start: 2016-11-12 — End: 2016-11-12
  Administered 2016-11-12: 07:00:00 112 ug via ORAL
  Filled 2016-11-11 (×2): qty 1

## 2016-11-11 MED ORDER — CARVEDILOL 6.25 MG PO TABS
12.5000 mg | ORAL_TABLET | Freq: Two times a day (BID) | ORAL | Status: DC
Start: 2016-11-11 — End: 2016-11-12
  Administered 2016-11-11 – 2016-11-12 (×2): 12.5 mg via ORAL
  Filled 2016-11-11 (×2): qty 2

## 2016-11-11 MED ORDER — ONDANSETRON HCL 4 MG/2ML IJ SOLN
4.0000 mg | Freq: Every day | INTRAMUSCULAR | Status: DC | PRN
Start: 2016-11-11 — End: 2016-11-12

## 2016-11-11 MED ORDER — SIMVASTATIN 10 MG PO TABS
10.0000 mg | ORAL_TABLET | Freq: Every evening | ORAL | Status: DC
Start: 2016-11-11 — End: 2016-11-12
  Administered 2016-11-12: 01:00:00 10 mg via ORAL
  Filled 2016-11-11 (×2): qty 1

## 2016-11-11 MED ORDER — GABAPENTIN 100 MG PO CAPS
100.0000 mg | ORAL_CAPSULE | Freq: Three times a day (TID) | ORAL | Status: DC
Start: 2016-11-11 — End: 2016-11-12
  Administered 2016-11-11 – 2016-11-12 (×3): 100 mg via ORAL
  Filled 2016-11-11 (×6): qty 1

## 2016-11-11 MED ORDER — FUROSEMIDE 40 MG PO TABS
40.0000 mg | ORAL_TABLET | Freq: Every day | ORAL | Status: DC
Start: 2016-11-12 — End: 2016-11-12
  Filled 2016-11-11: qty 1

## 2016-11-11 MED ORDER — SODIUM CHLORIDE 0.9 % IV SOLN
INTRAVENOUS | Status: AC
Start: 2016-11-11 — End: 2016-11-11

## 2016-11-11 MED ORDER — ACETAMINOPHEN 325 MG PO TABS
650.0000 mg | ORAL_TABLET | Freq: Four times a day (QID) | ORAL | Status: DC | PRN
Start: 2016-11-11 — End: 2016-11-12
  Administered 2016-11-11: 650 mg via ORAL

## 2016-11-11 MED ORDER — CLOPIDOGREL BISULFATE 75 MG PO TABS
75.0000 mg | ORAL_TABLET | Freq: Every day | ORAL | Status: DC
Start: 2016-11-12 — End: 2016-11-12
  Administered 2016-11-12: 09:00:00 75 mg via ORAL
  Filled 2016-11-11: qty 1

## 2016-11-11 MED ORDER — CLOPIDOGREL BISULFATE 300 MG PO TABS
ORAL_TABLET | ORAL | Status: AC
Start: 2016-11-11 — End: 2016-11-11
  Administered 2016-11-11: 10:00:00 300 mg via ORAL
  Filled 2016-11-11: qty 1

## 2016-11-11 MED ORDER — LIDOCAINE HCL (PF) 1 % IJ SOLN
INTRAMUSCULAR | Status: AC
Start: 2016-11-11 — End: 2016-11-11
  Administered 2016-11-11: 08:00:00 6 mg via INTRADERMAL
  Filled 2016-11-11: qty 30

## 2016-11-11 MED ORDER — HEPARIN WASH BOWL 5 UNITS/ML SOLN (CATH LAB)
Status: AC
Start: 2016-11-11 — End: 2016-11-11
  Filled 2016-11-11: qty 2000

## 2016-11-11 MED ORDER — MIDAZOLAM HCL 2 MG/2ML IJ SOLN
INTRAMUSCULAR | Status: AC
Start: 2016-11-11 — End: 2016-11-11
  Administered 2016-11-11: 08:00:00 1 mg via INTRAVENOUS
  Filled 2016-11-11: qty 2

## 2016-11-11 MED ORDER — HEPARIN SODIUM (PORCINE) 1000 UNIT/ML IJ SOLN
INTRAMUSCULAR | Status: AC
Start: 2016-11-11 — End: 2016-11-11
  Administered 2016-11-11 (×2): 2000 [IU] via INTRAVENOUS
  Administered 2016-11-11: 08:00:00 1000 [IU] via INTRAVENOUS
  Filled 2016-11-11: qty 10

## 2016-11-11 NOTE — Plan of Care (Signed)
Problem: Moderate/High Fall Risk Score >5  Goal: Patient will remain free of falls   11/11/16 1030   OTHER   High (Greater than 13) HIGH-Apply yellow "Fall Risk" arm band;HIGH-Consider use of low bed       Problem: Hemodynamic Status: Cardiac  Goal: Stable vital signs and fluid balance  Outcome: Progressing   11/11/16 1225   Goal/Interventions addressed this shift   Stable vital signs and fluid balance Monitor/assess vital signs and telemetry per unit protocol;Assess signs and symptoms associated with cardiac rhythm changes;Monitor for leg swelling/edema and report to LIP if abnormal       Problem: Inadequate Tissue Perfusion  Goal: Adequate tissue perfusion will be maintained  Outcome: Progressing   11/11/16 1225   Goal/Interventions addressed this shift   Adequate tissue perfusion will be maintained Monitor/assess vital signs

## 2016-11-11 NOTE — UM Notes (Signed)
Place in Outpatient/Ambulatory Status: 11/11/16 5409    iatrogenic ASD post afib ablation  Post-Procedure Dx: successful closure with amplatz 13 mm ASD occluder

## 2016-11-11 NOTE — Brief Op Note (Signed)
Pre-procedure Dx: iatrogenic ASD post afib ablation  Post-Procedure Dx: successful closure with amplatz 13 mm ASD occluder  Operator: Tahmid Stonehocker  Access: RFV and LFV  Closure: B/L perclose  Complications: None  Findings: Qp:Qs is 1.6 (manual); there is a second smaller defect (superior) with L to R shunting (not treated)  A: Successful closure of iatrogenic ASD  P: Overnight observation; plavix and xarelto for 3 month then xarelto alone; may resume xarelto in 48 hrs

## 2016-11-11 NOTE — Progress Notes (Signed)
H&P UPDATE WITH ASA/MALLAMPATTI    Date Time: 11/11/16 6:27 AM    PROCEDURE:    ASD closure  INDICATIONS:    ASD with right sided heart failure  H&P:    The history and physical including past medical, family, and social history were reviewed   and there are no significant interval changes from what is currently available in the chart  from prior evaluation. She has no complaints.  She was seen and examined by me prior   to the procedure.     The patient's decreased ejection fraction, which has now improved to 45%, is unrelated to her atrial septal defect; however, the continuous left to right shunt, which persists greater than six months post ablation, and the fact that her right ventricle is dilated as is her right atrium and that she has moderate to severe tricuspid regurgitation and the fact that she has symptoms of right sided failure, indicates a closure may be of benefit. She likely has minimal reserve of RV function and the additional load has worsened the right sided heart failure.We discussed percutaneous closure of the atrial septal defect with ICE guidance.  I explained the risks of the procedure, including stroke, device embolization, emergency surgery, major bleeding episode, and worsening of her rhythm issues.  I also explained to her that there is a possibility that the closure may not help her symptoms; however, I am hoping that with shutting down of the continuous shunt, her RV size may decrease and therefore, tricuspid regurgitation may also improve.  In reviewing her echocardiogram, which was done in March 2017 before the ablation, her RV does not appear to be as enlarged with only one parameter being abnormal, whereas the RV measurements are worse on the most recent echo.  Basically the degree of tricuspid regurgitation was described as moderate, whereas it is now more moderate to severe  ALLERGIES:    Erythromycin and Gluten meal   LABS:      Lab Results   Component Value Date    WBC 4.04  10/29/2016    HGB 11.9 (L) 10/29/2016    HCT 38.3 10/29/2016    PLT 140 10/29/2016    NA 140 11/05/2016    K 3.4 (L) 11/05/2016    CL 99 (L) 11/05/2016    CO2 30 (H) 11/05/2016    MG 1.9 11/05/2016    BUN 29.0 (H) 11/05/2016    CREAT 1.1 11/05/2016    EGFR 47.5 11/05/2016    GLU 96 11/05/2016     ASA PHYSICAL STATUS    Class 3 - Severe systemic disease, limits normal activity but not incapacitating  MALLAMPATTI AIRWAY CLASSIFICATION    Class II: Visibility of hard and soft palate, upper portion of tonsils and uvula  PLANNED SEDATION:    ( ) NO SEDATION   (x) MODERATE SEDATION   ( ) DEEP SEDATION WITH ANESTHESIA   CONCLUSION:    The risks, benefits and alternatives of the procedure have been discussed in detail and   she has indicated that she understands the procedure, indications, and risks inherent to the   procedure and is amenable to proceeding.  All questions were answered. Informed   consent was signed and verified.      Signed by: Kendell Bane, MD

## 2016-11-11 NOTE — Progress Notes (Signed)
Outpatient into [facility] room 6 for ASD Closure with Dr. Garlan Fair. ID band, allergies, and NPO status verified. Pt A&OX 4. VSS. Denies pain or discomfort. Pt ambulates with SBA and cane. Son/Husband at the bedside. Call bell within reach, will continue to monitor.

## 2016-11-11 NOTE — Progress Notes (Addendum)
Received report and patient into ICAR room 39 s/p . Artrial septal defect closure by dr Garlan Fair.Assumed patient's care. ID band verified, alert and oriented x 3, VSS, aldrete 10, pain 0 /10, NSR rhythm on tele with occasional PVC.dressing on both left and right groin CDI, soft with no hematoma or oozing. diet started. Call bell within reach.     Post- Cath Teaching and Learning Objectives   Learner: [name], Pt  Preference for learning: Verbal  Teaching Method: Verbal Instruction, Written Material   Outcome of Learning: Fully Achieved    Described/Demonstrated the following:   + Need for maintaining bedrest & straight leg post-procedure & sheath removal  + Method for applying pressure to cath site when coughing, laughing & voiding post-procedure

## 2016-11-12 ENCOUNTER — Ambulatory Visit: Payer: Medicare Other

## 2016-11-12 ENCOUNTER — Other Ambulatory Visit (INDEPENDENT_AMBULATORY_CARE_PROVIDER_SITE_OTHER): Payer: Self-pay

## 2016-11-12 DIAGNOSIS — Q211 Atrial septal defect: Secondary | ICD-10-CM

## 2016-11-12 DIAGNOSIS — I420 Dilated cardiomyopathy: Secondary | ICD-10-CM

## 2016-11-12 LAB — CBC
Absolute NRBC: 0 10*3/uL
Hematocrit: 37.5 % (ref 37.0–47.0)
Hgb: 12.2 g/dL (ref 12.0–16.0)
MCH: 31 pg (ref 28.0–32.0)
MCHC: 32.5 g/dL (ref 32.0–36.0)
MCV: 95.4 fL (ref 80.0–100.0)
MPV: 9.9 fL (ref 9.4–12.3)
Nucleated RBC: 0 /100 WBC (ref 0.0–1.0)
Platelets: 150 10*3/uL (ref 140–400)
RBC: 3.93 10*6/uL — ABNORMAL LOW (ref 4.20–5.40)
RDW: 15 % (ref 12–15)
WBC: 3.8 10*3/uL (ref 3.50–10.80)

## 2016-11-12 MED ORDER — CLOPIDOGREL BISULFATE 75 MG PO TABS
75.0000 mg | ORAL_TABLET | Freq: Every day | ORAL | 0 refills | Status: DC
Start: 2016-11-13 — End: 2017-04-09

## 2016-11-12 NOTE — Plan of Care (Signed)
Outcome met, education completed.

## 2016-11-12 NOTE — Progress Notes (Signed)
Pt d/c'd to home per order, IV line and tele monitor removed, d/c instruction with list of meds and f/u instruction given to pt, education for groin access site care and precautions done with pt, pt verbalizes understanding, pt was able to void and ambulate in the hall prior to dischrge, pt left unit via w/c with RN family to pick up at [facility] lobby.

## 2016-11-12 NOTE — Plan of Care (Signed)
Problem: Hemodynamic Status: Cardiac  Goal: Stable vital signs and fluid balance  Outcome: Progressing      Problem: Inadequate Tissue Perfusion  Goal: Adequate tissue perfusion will be maintained  Outcome: Progressing      Comments: Pt is AXOXO4, up ad lib independently without any discomfort.  Bilateral groin puncture sites appears C/D/I. No bleeding , no hematoma.  Denies any numbness or tingling in extremities, pulses are audible via dopplers, afebrile,VSS. V-paced on telemetry.  Pt walked to the BR(voided) and around the unit with a steady gait, no complaints of sob,dizziness. No apparent distress reported/noted.  Will continue to monitor

## 2016-11-12 NOTE — Discharge Summary (Addendum)
[place] [facility] PROGRESS NOTE  [facility]      Date Time: 11/12/16 8:16 AM  Patient Name: [name], [name]  Medical Record #:  [id number]  Account#:  [id number]  Admission Date:  11/11/2016         Patient Active Problem List   Diagnosis   . Celiac disease   . Scoliosis   . Atrial fibrillation   . Mitral valve disorder   . Disease of tricuspid valve   . Chronic airway obstruction, not elsewhere classified   . AF (atrial fibrillation)   . Paroxysmal a-fib   . CHF (congestive heart failure)   . Ischemic embolic stroke   . Decreased diffusion capacity   . PFO (patent foramen ovale)   . Acquired hypothyroidism   . Chronic systolic heart failure   . PAH (pulmonary artery hypertension)   . Non-rheumatic mitral regurgitation   . Respiratory failure   . Ischemic stroke   . Altered mental status, unspecified altered mental status type   . Closed pertrochanteric fracture of femur, left, initial encounter   . Torsades de pointes   . ASD (atrial septal defect)       Subjective:   Denies chest pain, SOB or palpitations.    Assessment:    S/p ASD closure 11/11/2016   Transseptal ASD with left to right shunt and Qp:Qs 1.6 by cardiac catheterization. Dilated right-sided chambers with moderate-to-severe tricuspid regurgitation.   Chronic CHF   Nonischemic cardiomyopathy with ejection fraction 25% November 2017, up to 45% October 06, 2016.   Recurrent atrial fibrillation noted on last visit Oct 27, 2016.  Status post failure of amiodarone and extensive left and right atrial focus impulsive rotor modulation ablation with pulmonary vein isolation September 2017   Chronic Xarelto for anticoagulation   Out of hospital cardiac arrest December 2017, on Tikosyn and also with progressive decline in left ventricular function, treated with implantable cardioverter defibrillator implant December 2017.   Kyphoscoliosis with moderate restriction.   Hypothyroidism   Severe DLCO abnormality followed by Dr. Stacy Gardner.    Hypothyroidism,  on Synthroid.    Recommendations:    Echo this morning, then in 1 week, 1 month, 6 months   Will be on Plavix for 3 months. Resume Xarelto tomorrow and continue the Xarelto throughout for stroke ppx given AF   Followup with Dr. Talmadge Chad in 1-2 weeks    Seen and examined.  Our assessment and plan as above.  Plavix/xarelto as above.  Echo and office follow up as above.  Bertram Millard Cannan Beeck, MD.        Medications:      Scheduled Meds:      carvedilol 12.5 mg Oral Q12H [facility]   clopidogrel 75 mg Oral Daily   furosemide 40 mg Oral Daily   gabapentin 100 mg Oral Q8H [facility]   levothyroxine 112 mcg Oral Daily at 0600   simvastatin 10 mg Oral QHS       Continuous Infusions:           Physical Exam:       VITAL SIGNS PHYSICAL EXAM   Vitals:    11/12/16 0428   BP: 122/73   Pulse: 80   Resp: 16   Temp: 98.7 F (37.1 C)   SpO2: 94%     Temp (24hrs), Avg:98.3 F (36.8 C), Min:97.7 F (36.5 C), Max:98.7 F (37.1 C)      Telemetry: Reviewed intermittent VP    No intake or output data in the 24 hours  ending 11/12/16 0816 Physical Exam  General: awake, alert, breathing comfortably, no acute distress  Head: normocephalic  Eyes: EOM's intact  Cardiovascular: regular rate and rhythm, normal S1, S2, no S3, no S4, no murmurs, rubs or gallops  Neck: no carotid bruits or JVD  Lungs: clear to auscultation bilateraly, without wheezing, rhonchi, or rales  Abdomen: soft, non-tender, non-distended; no palpable masses,  normoactive bowel sounds  Bilateral groin sites c/d/i , soft, no hematoma with Distal DPs palpable +2  Extremities: no edema  Pulse: equal pulses, 4/4 symmetric  Neurological: Alert and oriented X3, mood and affect normal  Musculoskeletal: normal strength and tone         Labs:                     Recent Labs  Lab 11/05/16  1327   Magnesium 1.9           Recent Labs  Lab 11/12/16  0450   WBC 3.80   Hgb 12.2   Hematocrit 37.5   Platelets 150       Recent Labs  Lab 11/05/16  1327   Sodium 140   Potassium 3.4*   Chloride 99*   CO2 30*    BUN 29.0*   Creatinine 1.1   EGFR 47.5   Glucose 96   Calcium 9.3           Invalid input(s): FREET4    .No results found for: BNP     Weight Monitoring 05/18/2016 05/19/2016 05/19/2016 07/10/2016 08/28/2016 11/10/2016 11/11/2016   Height 162.6 cm - 162.6 cm 162.6 cm - 162.6 cm 162.6 cm   Height Method - - - - - - Estimated   Weight - 66.5 kg - 61.236 kg 63.504 kg 61.236 kg 61.236 kg   Weight Method - Bed Scale - - - - Standing Scale   BMI (calculated) - - - 23.2 kg/m2 - 23.2 kg/m2 23.2 kg/m2       Imaging:       ____________________________________________    Signed by: Dreama Saa, [place]      [place] [facility]  NP Spectralink [phone] (8am-5pm)  MD Spectralink [phone] or [phone] (8am-5pm)  Arrhythmia Spectralink [phone] (8am-4:30pm)  After hours, non urgent consult line [phone]  After Hours, urgent consults [phone]

## 2016-11-12 NOTE — Plan of Care (Signed)
Echo today" ASD well seated without shunting. EF down to 20-25%. She ha had EF 20% back in Nov 2017. Continue Coreg for CM. Can readdress in office followup for additional GDMT meds

## 2016-11-12 NOTE — Discharge Instr - AVS First Page (Addendum)
Reason for your Hospital Admission:ASD closure      Instructions for after your discharge:  Can restart Xarelto tomorrow night 11/13/2016. Please take with food (dinner)    Interventional Cardiovascular Admission and Recovery  Catheterization Discharge Instructions  Adult ASD and PFO Device Closures    Activity Restrictions:  1. Do not drive for 48 hours after the procedure.  2. Do not lift anything over 10 pounds in weight for one (1) week.  This includes pushing, pulling, dragging and tugging.  3. No exercise or strenuous activity for one (1) month  (includes:  competitive sports, bicycling, running, swimming)  4. Limit stair usage for the next 24 hours.  If you must use the stairs, take them one at a time, leading with your unaffected leg holding pressure on the bandaged site.  5. Support the bandaged site when coughing or sneezing.  Do not strain when having a bowel movement.  6. No dental work for six (6) months post procedure (includes  routine cleaning).  Prophylactic antibiotics (your dentist can provide) need to be used for any urgent or emergent care during the first six (6) months after your procedure.  Please inform your dentist.  Antibiotics are not needed after six (6) months.  7. No elective surgery for six (6) months after the procedure.    Wound/Incision Care:  1. No tub baths, hot tubs, pools, or sitting in water for one week.  2. You may shower 24 hours after your procedure.  Leave the bandage in place and allow the water to passively flow over the site.  3. Remove the bandage 48 hours after the procedure, either before or during your shower. Again, let the water passively flow over the site , wash gently with your hand, then pat the area dry.  4. Do not place any creams, powders, lotions or ointments on the site.  5. Do not rub, scrub, pick, scratch or use a washcloth on the site.  6. Apply a regular sized Band-Aid to the puncture site and change it daily for five (5) days. You may shower  daily.  7. Observe for signs of infection:  redness, warmth, swelling, drainage, or temperature greater than 100 degrees F.  If you suspect infection call the doctor who performed the procedure.  8. Drink 6-8 glasses of water for at least 48 hours to help flush your body of the dye used during the procedure.      Normal Observation:  1. Bruising and/or tenderness at the site.  IF a closure device was used, you may feel a small lump, about the size of an olive pit, which should disappear within 90 days.  2. For a few days after your procedure you may feel some skipped heart beats, shorts bursts of rapid heart rate or dizziness when arising.    Medications:  1. Aspirin   _________   mg for six (6) months after your procedure.   2. Plavix    __________  mg for three (3) months after your procedure.  3. Antibiotics - May need pre-treatment for prevention of subacute bacterial endocarditis (SBE) prior to any elective surgical or dental procedure.  Discuss with your physician.  4. Acetaminophen (Tylenol) recommended if medication is needed for pain.    When to call MD:  1. Fever > 101F during the first week after your procedure with or without signs of infection at the access site.  2. Headache, slurred speech, or blurred vision.  3. Legs become numb or  tingling.  4. Arrhythmia  - an irregular prolonged (>30 seconds) heart beat.

## 2016-11-12 NOTE — Plan of Care (Signed)
Problem: Hemodynamic Status: Cardiac  Goal: Stable vital signs and fluid balance  Outcome: Progressing   11/12/16 1610   Goal/Interventions addressed this shift   Stable vital signs and fluid balance Monitor/assess vital signs and telemetry per unit protocol;Assess signs and symptoms associated with cardiac rhythm changes;Monitor intake/output per unit protocol and/or LIP order;Monitor for leg swelling/edema and report to LIP if abnormal       Problem: Inadequate Tissue Perfusion  Goal: Adequate tissue perfusion will be maintained  Outcome: Progressing   11/12/16 0829   Goal/Interventions addressed this shift   Adequate tissue perfusion will be maintained Monitor/assess vital signs;Monitor/assess lab values and report abnormal values;Monitor/assess neurovascular status (pulses, capillary refill, pain, paresthesia, paralysis, presence of edema);Monitor intake and output;Increase mobility as tolerated/progressive mobility;Assess and monitor skin integrity

## 2016-11-18 ENCOUNTER — Other Ambulatory Visit: Payer: Self-pay | Admitting: Cardiovascular Disease

## 2016-11-18 DIAGNOSIS — Q2111 Secundum atrial septal defect: Secondary | ICD-10-CM

## 2016-11-19 ENCOUNTER — Ambulatory Visit: Payer: Medicare Other

## 2016-11-20 ENCOUNTER — Other Ambulatory Visit: Payer: Self-pay | Admitting: Cardiovascular Disease

## 2016-11-20 DIAGNOSIS — Q211 Atrial septal defect, unspecified: Secondary | ICD-10-CM

## 2016-11-20 LAB — VAHRT HISTORIC LVEF: Ejection Fraction: 20 %

## 2016-11-23 ENCOUNTER — Ambulatory Visit (INDEPENDENT_AMBULATORY_CARE_PROVIDER_SITE_OTHER): Payer: Self-pay | Admitting: Medical

## 2016-11-23 ENCOUNTER — Ambulatory Visit: Payer: Medicare Other

## 2016-11-23 ENCOUNTER — Ambulatory Visit
Admission: RE | Admit: 2016-11-23 | Discharge: 2016-11-23 | Disposition: A | Discharge status: Home / Self Care | Payer: Medicare Other | Source: Ambulatory Visit | Attending: Cardiovascular Disease | Admitting: Cardiovascular Disease

## 2016-11-23 ENCOUNTER — Other Ambulatory Visit: Payer: Self-pay | Admitting: Cardiovascular Disease

## 2016-11-23 ENCOUNTER — Other Ambulatory Visit: Payer: Self-pay | Admitting: Cardiology

## 2016-11-23 ENCOUNTER — Other Ambulatory Visit: Payer: Self-pay | Admitting: Nurse Practitioner

## 2016-11-23 DIAGNOSIS — I5022 Chronic systolic (congestive) heart failure: Secondary | ICD-10-CM | POA: Insufficient documentation

## 2016-11-23 DIAGNOSIS — Q211 Atrial septal defect: Secondary | ICD-10-CM

## 2016-11-23 DIAGNOSIS — Q2111 Secundum atrial septal defect: Secondary | ICD-10-CM

## 2016-11-23 DIAGNOSIS — Z95810 Presence of automatic (implantable) cardiac defibrillator: Secondary | ICD-10-CM | POA: Insufficient documentation

## 2016-11-23 DIAGNOSIS — I480 Paroxysmal atrial fibrillation: Secondary | ICD-10-CM | POA: Insufficient documentation

## 2016-11-23 DIAGNOSIS — Z95818 Presence of other cardiac implants and grafts: Secondary | ICD-10-CM | POA: Insufficient documentation

## 2016-11-23 DIAGNOSIS — I420 Dilated cardiomyopathy: Secondary | ICD-10-CM | POA: Insufficient documentation

## 2016-11-23 DIAGNOSIS — I5189 Other ill-defined heart diseases: Secondary | ICD-10-CM | POA: Insufficient documentation

## 2016-11-23 DIAGNOSIS — Z4502 Encounter for adjustment and management of automatic implantable cardiac defibrillator: Secondary | ICD-10-CM | POA: Insufficient documentation

## 2016-11-23 LAB — BASIC METABOLIC PANEL
BUN: 27 mg/dL — ABNORMAL HIGH (ref 7.0–19.0)
CO2: 31 mEq/L — ABNORMAL HIGH (ref 21–29)
Calcium: 9.8 mg/dL (ref 7.9–10.2)
Chloride: 102 mEq/L (ref 100–111)
Creatinine: 0.9 mg/dL (ref 0.4–1.5)
Glucose: 89 mg/dL (ref 70–100)
Potassium: 4.9 mEq/L (ref 3.5–5.1)
Sodium: 144 mEq/L (ref 136–145)

## 2016-11-23 LAB — HEMOLYSIS INDEX: Hemolysis Index: 5 (ref 0–18)

## 2016-11-23 LAB — CBC
Absolute NRBC: 0 10*3/uL
Hematocrit: 40.2 % (ref 37.0–47.0)
Hgb: 12.4 g/dL (ref 12.0–16.0)
MCH: 30.8 pg (ref 28.0–32.0)
MCHC: 30.8 g/dL — ABNORMAL LOW (ref 32.0–36.0)
MCV: 99.8 fL (ref 80.0–100.0)
MPV: 10.3 fL (ref 9.4–12.3)
Nucleated RBC: 0 /100 WBC (ref 0.0–1.0)
Platelets: 165 10*3/uL (ref 140–400)
RBC: 4.03 10*6/uL — ABNORMAL LOW (ref 4.20–5.40)
RDW: 15 % (ref 12–15)
WBC: 4.25 10*3/uL (ref 3.50–10.80)

## 2016-11-23 LAB — TSH: TSH: 0.95 u[IU]/mL (ref 0.35–4.94)

## 2016-11-23 LAB — GFR: EGFR: 59.8

## 2016-11-27 ENCOUNTER — Ambulatory Visit: Payer: Medicare Other

## 2016-12-03 ENCOUNTER — Ambulatory Visit (INDEPENDENT_AMBULATORY_CARE_PROVIDER_SITE_OTHER): Payer: Medicare Other | Admitting: Orthopaedic Surgery

## 2016-12-03 ENCOUNTER — Encounter (INDEPENDENT_AMBULATORY_CARE_PROVIDER_SITE_OTHER): Payer: Self-pay | Admitting: Orthopaedic Surgery

## 2016-12-03 ENCOUNTER — Ambulatory Visit (INDEPENDENT_AMBULATORY_CARE_PROVIDER_SITE_OTHER): Payer: Medicare Other

## 2016-12-03 VITALS — HR 75 | Resp 16 | Wt 135.0 lb

## 2016-12-03 DIAGNOSIS — S72102D Unspecified trochanteric fracture of left femur, subsequent encounter for closed fracture with routine healing: Secondary | ICD-10-CM

## 2016-12-03 DIAGNOSIS — S72102A Unspecified trochanteric fracture of left femur, initial encounter for closed fracture: Secondary | ICD-10-CM

## 2016-12-03 NOTE — Progress Notes (Signed)
Orthopaedic Trauma Follow Up Visit    Chief Complaint   Patient presents with   . Hip Injury     5.5 months S/P CLOSED REDUCTION, INSERTION, HIP IM NAIL (GAMMA) DOS 05/20/2016       HPI: [name] is a 83 year old female status post cephalo-medullary fixation of left femur.  Overall, the patient is doing well.  She does not have any pain. She is ambulating with a cane.  She has no other issues.She denies any distal numbness or procedures.  She has her to [facility] physical therapy.    ROS: Noncontributory    Patient History:  Patient Active Problem List    Diagnosis Date Noted   . ASD (atrial septal defect) 11/11/2016   . Torsades de pointes 05/27/2016   . Closed pertrochanteric fracture of femur, left, initial encounter    . Ischemic embolic stroke 05/19/2016   . Decreased diffusion capacity 05/19/2016   . PFO (patent foramen ovale) 05/19/2016   . Acquired hypothyroidism 05/19/2016   . Chronic systolic heart failure 05/19/2016   . PAH (pulmonary artery hypertension) 05/19/2016   . Non-rheumatic mitral regurgitation 05/19/2016   . Respiratory failure 05/19/2016   . Ischemic stroke 05/19/2016   . Altered mental status, unspecified altered mental status type    . CHF (congestive heart failure) 04/17/2016   . Paroxysmal a-fib 03/06/2016   . AF (atrial fibrillation) 12/28/2013   . Chronic airway obstruction, not elsewhere classified 04/28/2013   . Atrial fibrillation 06/29/2012   . Mitral valve disorder 06/29/2012   . Disease of tricuspid valve 06/29/2012   . Celiac disease    . Scoliosis        Current Outpatient Prescriptions   Medication Sig Dispense Refill   . acetaminophen (TYLENOL) 500 MG tablet Take 1 tablet (500 mg total) by mouth every 6 (six) hours as needed for Pain.     Marland Kitchen alendronate (FOSAMAX) 70 MG tablet Take 70 mg by mouth every 7 days.Take 1 tab by mouth every 7 days with a full glass of water on an empty stomach. Do not take anything else by mouth or lie down for 30 mins.     . Calcium  Citrate-Vitamin D (CITRACAL + D PO) Take 2 tablets by mouth daily.     . carvedilol (COREG) 12.5 MG tablet Take 1 tablet (12.5 mg total) by mouth every 12 (twelve) hours.     . clopidogrel (PLAVIX) 75 mg tablet Take 1 tablet (75 mg total) by mouth daily. 90 tablet 0   . CO-ENZYME Q-10 PO Take by mouth daily.     . fluticasone furoate (ARNUITY ELLIPTA) 100 MCG/ACT Aerosol Powder, Breath Activtivatede Inhale 1 puff into the lungs as needed.         . furosemide (LASIX) 20 MG tablet Take 40 mg by mouth daily.         Marland Kitchen gabapentin (NEURONTIN) 100 MG capsule Take 100 mg by mouth 3 (three) times daily.     . iron-vitamin C (VITRON-C) 65-125 MG Tab Take 1 tablet by mouth every tues and thurs.     Marland Kitchen levothyroxine (SYNTHROID, LEVOTHROID) 112 MCG tablet Take 112 mcg by mouth Once a day at 6:00am.     . Magnesium 250 MG Tab Take 250 mg by mouth daily.     . Multiple Vitamin (MULTIVITAMIN) tablet Take 1 tablet by mouth daily.     . potassium chloride (K-TAB, KLOR-CON) 10 MEQ tablet TAKE TID BY MOUTH EVERY DAY  0   .  Probiotic Product (ALIGN) 4 MG Cap Take by mouth.Take as directed         . simvastatin (ZOCOR) 10 MG tablet Take 10 mg by mouth nightly.     [name] 20 MG Tab TAKE 20 MG BY MOUTH IN THE EVENING FOR DVT PROPHYLAXIS  0     No current facility-administered medications for this visit.      .  Allergies: Erythromycin and Gluten meal  Past Medical History:   Diagnosis Date   . Abnormal vision     Dry eyes; marginal vision L eye   . Abnormal vision     Wears glasses   . Anemia     controlled on meds   . Aortic valve insufficiency    . Arrhythmia     Atrial fibrillation   . Arthritis     osteoarthritis, back and pelvis   . AVD (aortic valve disease)    . Benign heart murmur    . Bilateral cataracts     surgery, right eye   . Celiac disease    . Chronic obstructive pulmonary disease     mild-mod   . Difficulty walking     Back pain; Uses cane   . Disease of lung    . Disorder of musculoskeletal system ?    ?   Marland Kitchen Dyspnea  on exertion    . Ear, nose and throat disorder     Hoarseness   . Encounter for cardioversion procedure 10/31/2013   . Gastroesophageal reflux disease    . GERD (gastroesophageal reflux disease)     occasional , asymptomatic   . Headache     infrequently   . Heart murmur    . Hyperlipidemia     controlled with meds   . Hypertension     controlled w/meds   . Hypothyroidism     controlled with meds   . Low back pain    . Mitral valve insufficiency    . Mitral valve prolapse    . Orthopedic aftercare     scoliosis, stenosis, athritis   . Osteoporosis    . Pacemaker 05/2016    Bring card [place]   . Pneumonia     had pneumonia shot 2010 and 2015   . Primary cardiomyopathy    . Scoliosis     Severe   . Shortness of breath    . Thyroid disease    . Vein disorder     bilateral legs     Past Surgical History:   Procedure Laterality Date   . ADENOIDECTOMY      as a child   . APPENDECTOMY      as a child   . CARDIAC CATHETERIZATION  2007   . CARDIAC CATHETERIZATION  2014   . CARDIOVERSION      x3 for A Fib , w/TEE   . CATARACT EXTRACTION W/ INTRAOCULAR LENS IMPLANT Right 2001   . CLOSED REDUCTION, INSERTION, HIP IM NAIL (GAMMA) Left 05/20/2016    Procedure: CLOSED REDUCTION, INSERTION, HIP IM NAIL (GAMMA);  Surgeon: Timoteo Ace, MD;  Location[facility];  Service: Orthopedics;  Laterality: Left;   . COLONOSCOPY      "years ago"   . INSERT / REPLACE / REMOVE PACEMAKER  05/25/2016    PM card dos   . JOINT REPLACEMENT Right 10-15-15     KNEE No Complications   . KNEE SURGERY Left 2002   . TONSILLECTOMY AND ADENOIDECTOMY  Family History   Problem Relation Age of Onset   . Pneumonia Mother    . Heart failure Father    . Hypertension Father    . Colon cancer Neg Hx    . Malignant hyperthermia Neg Hx    . Pseudochol deficiency Neg Hx    . Anesthesia problems Neg Hx      Social History   Substance Use Topics   . Smoking status: Never Smoker   . Smokeless tobacco: Never Used   . Alcohol use Yes      Comment: Rarely        In addition to findings reviewed in EPIC, relevant Medical/Family/Social History noted in today's HPI, otherwise are noncontributory to today's visit.    Physical Exam: Pt is a well developed, well nourished 83 y.o. year old female who is awake, alert, and oriented.  The patient has a normal affect and is in no acute distress.    Vitals:    12/03/16 0935   Pulse: 75   Resp: 16   SpO2: 100%     Gait: Ambulates with a cane    Left lower extremity: Incision well-healed.  She is distally neurovascularly intact with good cap refill.  She has no tenderness to palpation.  She has full range of motion of the hip with flexion, extension, internal/external rotation.      Radiology: Xrays taken in clinic today: AP and lateral views of the left femur demonstrate satisfactory reduction and alignment of the patient's left peritrochanteric femur fracture with abundant callus formation noted.    Impression: 83 year old female status post cephalo-medullary fixation, left femur, doing well.    Plan:  Patient will continue weightbearing as tolerated. She may follow up on an as needed basis.    Attending Addendum/Attestation:    I have personally seen and examined this patient and have participated in their care. I agree with the clinical information, including the physical exam, patient history, and planning as documented by the Resident or Physician Assistant. In addition, I have edited this note to reflect my findings and plan as well as to incorporate any new data.      Chevis Pretty, MD  Orthopaedic Trauma  Pager [phone]  Office [phone]

## 2016-12-04 ENCOUNTER — Ambulatory Visit (INDEPENDENT_AMBULATORY_CARE_PROVIDER_SITE_OTHER): Payer: Medicare Other | Admitting: Orthopaedic Surgery

## 2016-12-04 ENCOUNTER — Inpatient Hospital Stay
Admission: EM | Admit: 2016-12-04 | Discharge: 2016-12-11 | DRG: 378 | Disposition: A | Discharge status: Home / Self Care | Payer: Medicare Other | Attending: Specialist | Admitting: Specialist

## 2016-12-04 ENCOUNTER — Emergency Department: Payer: Medicare Other

## 2016-12-04 DIAGNOSIS — K922 Gastrointestinal hemorrhage, unspecified: Secondary | ICD-10-CM

## 2016-12-04 DIAGNOSIS — K648 Other hemorrhoids: Secondary | ICD-10-CM | POA: Diagnosis present

## 2016-12-04 DIAGNOSIS — Z79899 Other long term (current) drug therapy: Secondary | ICD-10-CM

## 2016-12-04 DIAGNOSIS — I890 Lymphedema, not elsewhere classified: Secondary | ICD-10-CM | POA: Diagnosis present

## 2016-12-04 DIAGNOSIS — K449 Diaphragmatic hernia without obstruction or gangrene: Secondary | ICD-10-CM | POA: Diagnosis present

## 2016-12-04 DIAGNOSIS — M419 Scoliosis, unspecified: Secondary | ICD-10-CM | POA: Diagnosis present

## 2016-12-04 DIAGNOSIS — Z7901 Long term (current) use of anticoagulants: Secondary | ICD-10-CM

## 2016-12-04 DIAGNOSIS — I110 Hypertensive heart disease with heart failure: Secondary | ICD-10-CM | POA: Diagnosis present

## 2016-12-04 DIAGNOSIS — I2510 Atherosclerotic heart disease of native coronary artery without angina pectoris: Secondary | ICD-10-CM

## 2016-12-04 DIAGNOSIS — I5022 Chronic systolic (congestive) heart failure: Secondary | ICD-10-CM | POA: Diagnosis present

## 2016-12-04 DIAGNOSIS — Z8674 Personal history of sudden cardiac arrest: Secondary | ICD-10-CM

## 2016-12-04 DIAGNOSIS — K921 Melena: Principal | ICD-10-CM | POA: Diagnosis present

## 2016-12-04 DIAGNOSIS — I429 Cardiomyopathy, unspecified: Secondary | ICD-10-CM | POA: Diagnosis present

## 2016-12-04 DIAGNOSIS — E039 Hypothyroidism, unspecified: Secondary | ICD-10-CM | POA: Diagnosis present

## 2016-12-04 DIAGNOSIS — Z7902 Long term (current) use of antithrombotics/antiplatelets: Secondary | ICD-10-CM

## 2016-12-04 DIAGNOSIS — D62 Acute posthemorrhagic anemia: Secondary | ICD-10-CM | POA: Diagnosis present

## 2016-12-04 DIAGNOSIS — D6832 Hemorrhagic disorder due to extrinsic circulating anticoagulants: Secondary | ICD-10-CM | POA: Diagnosis present

## 2016-12-04 DIAGNOSIS — D696 Thrombocytopenia, unspecified: Secondary | ICD-10-CM | POA: Diagnosis present

## 2016-12-04 DIAGNOSIS — K219 Gastro-esophageal reflux disease without esophagitis: Secondary | ICD-10-CM | POA: Diagnosis present

## 2016-12-04 DIAGNOSIS — D500 Iron deficiency anemia secondary to blood loss (chronic): Secondary | ICD-10-CM

## 2016-12-04 DIAGNOSIS — I480 Paroxysmal atrial fibrillation: Secondary | ICD-10-CM | POA: Diagnosis present

## 2016-12-04 DIAGNOSIS — Z95810 Presence of automatic (implantable) cardiac defibrillator: Secondary | ICD-10-CM

## 2016-12-04 DIAGNOSIS — T45525A Adverse effect of antithrombotic drugs, initial encounter: Secondary | ICD-10-CM | POA: Diagnosis present

## 2016-12-04 DIAGNOSIS — E785 Hyperlipidemia, unspecified: Secondary | ICD-10-CM | POA: Diagnosis present

## 2016-12-04 DIAGNOSIS — Z8774 Personal history of (corrected) congenital malformations of heart and circulatory system: Secondary | ICD-10-CM

## 2016-12-04 DIAGNOSIS — J449 Chronic obstructive pulmonary disease, unspecified: Secondary | ICD-10-CM | POA: Diagnosis present

## 2016-12-04 DIAGNOSIS — D649 Anemia, unspecified: Secondary | ICD-10-CM | POA: Diagnosis present

## 2016-12-04 DIAGNOSIS — K900 Celiac disease: Secondary | ICD-10-CM | POA: Diagnosis present

## 2016-12-04 DIAGNOSIS — K625 Hemorrhage of anus and rectum: Secondary | ICD-10-CM

## 2016-12-04 DIAGNOSIS — I4891 Unspecified atrial fibrillation: Secondary | ICD-10-CM

## 2016-12-04 LAB — COMPREHENSIVE METABOLIC PANEL
ALT: 12 U/L (ref 0–55)
AST (SGOT): 24 U/L (ref 5–34)
Albumin/Globulin Ratio: 1.5 (ref 0.9–2.2)
Albumin: 3.7 g/dL (ref 3.5–5.0)
Alkaline Phosphatase: 113 U/L — ABNORMAL HIGH (ref 37–106)
BUN: 23 mg/dL — ABNORMAL HIGH (ref 7.0–19.0)
Bilirubin, Total: 0.6 mg/dL (ref 0.2–1.2)
CO2: 22 mEq/L (ref 22–29)
Calcium: 9 mg/dL (ref 7.9–10.2)
Chloride: 105 mEq/L (ref 100–111)
Creatinine: 0.9 mg/dL (ref 0.6–1.0)
Globulin: 2.4 g/dL (ref 2.0–3.6)
Glucose: 102 mg/dL — ABNORMAL HIGH (ref 70–100)
Potassium: 4.4 mEq/L (ref 3.5–5.1)
Protein, Total: 6.1 g/dL (ref 6.0–8.3)
Sodium: 138 mEq/L (ref 136–145)

## 2016-12-04 LAB — CBC AND DIFFERENTIAL
Absolute NRBC: 0 10*3/uL
Basophils Absolute Automated: 0.01 10*3/uL (ref 0.00–0.20)
Basophils Automated: 0.2 %
Eosinophils Absolute Automated: 0.12 10*3/uL (ref 0.00–0.70)
Eosinophils Automated: 2.7 %
Hematocrit: 23.1 % — ABNORMAL LOW (ref 37.0–47.0)
Hgb: 7.4 g/dL — ABNORMAL LOW (ref 12.0–16.0)
Immature Granulocytes Absolute: 0.02 10*3/uL
Immature Granulocytes: 0.5 %
Lymphocytes Absolute Automated: 1.34 10*3/uL (ref 0.50–4.40)
Lymphocytes Automated: 30.6 %
MCH: 31.5 pg (ref 28.0–32.0)
MCHC: 32 g/dL (ref 32.0–36.0)
MCV: 98.3 fL (ref 80.0–100.0)
MPV: 9.9 fL (ref 9.4–12.3)
Monocytes Absolute Automated: 0.34 10*3/uL (ref 0.00–1.20)
Monocytes: 7.8 %
Neutrophils Absolute: 2.55 10*3/uL (ref 1.80–8.10)
Neutrophils: 58.2 %
Nucleated RBC: 0 /100 WBC (ref 0.0–1.0)
Platelets: 152 10*3/uL (ref 140–400)
RBC: 2.35 10*6/uL — ABNORMAL LOW (ref 4.20–5.40)
RDW: 16 % — ABNORMAL HIGH (ref 12–15)
WBC: 4.38 10*3/uL (ref 3.50–10.80)

## 2016-12-04 LAB — GFR: EGFR: 59.8

## 2016-12-04 LAB — PT AND APTT
PT INR: 1 (ref 0.9–1.1)
PT: 13.5 s (ref 12.6–15.0)
PTT: 25 s (ref 23–37)

## 2016-12-04 LAB — PREPARE RBC
Expiration Date: 201806192359
Status: TRANSFUSED
UTYPE: A POS

## 2016-12-04 LAB — TYPE AND SCREEN
AB Screen Gel: NEGATIVE
ABO Rh: A POS

## 2016-12-04 MED ORDER — SODIUM CHLORIDE 0.9 % IV BOLUS
1000.0000 mL | Freq: Once | INTRAVENOUS | Status: DC
Start: 2016-12-04 — End: 2016-12-04

## 2016-12-04 MED ORDER — LEVOTHYROXINE SODIUM 112 MCG PO TABS
112.0000 ug | ORAL_TABLET | Freq: Every day | ORAL | Status: DC
Start: 2016-12-05 — End: 2016-12-11
  Administered 2016-12-05 – 2016-12-11 (×7): 112 ug via ORAL
  Filled 2016-12-04 (×7): qty 1

## 2016-12-04 MED ORDER — SODIUM CHLORIDE 0.9 % IV BOLUS
500.0000 mL | Freq: Once | INTRAVENOUS | Status: AC
Start: 2016-12-04 — End: 2016-12-04
  Administered 2016-12-04: 19:00:00 500 mL via INTRAVENOUS

## 2016-12-04 MED ORDER — NALOXONE HCL 0.4 MG/ML IJ SOLN (WRAP)
0.2000 mg | INTRAMUSCULAR | Status: DC | PRN
Start: 2016-12-04 — End: 2016-12-11

## 2016-12-04 MED ORDER — CARVEDILOL 6.25 MG PO TABS
12.5000 mg | ORAL_TABLET | Freq: Two times a day (BID) | ORAL | Status: DC
Start: 2016-12-05 — End: 2016-12-04

## 2016-12-04 MED ORDER — FLUTICASONE FUROATE 100 MCG/ACT IN AEPB
1.0000 | INHALATION_SPRAY | Freq: Every morning | RESPIRATORY_TRACT | Status: DC
Start: 2016-12-05 — End: 2016-12-05
  Administered 2016-12-05: 09:00:00 1 via RESPIRATORY_TRACT
  Filled 2016-12-04: qty 14

## 2016-12-04 MED ORDER — ACETAMINOPHEN 500 MG PO TABS
500.0000 mg | ORAL_TABLET | Freq: Four times a day (QID) | ORAL | Status: DC | PRN
Start: 2016-12-04 — End: 2016-12-11

## 2016-12-04 MED ORDER — PANTOPRAZOLE SODIUM 40 MG IV SOLR
40.0000 mg | Freq: Two times a day (BID) | INTRAVENOUS | Status: DC
Start: 2016-12-05 — End: 2016-12-11
  Administered 2016-12-05 – 2016-12-11 (×14): 40 mg via INTRAVENOUS
  Filled 2016-12-04 (×15): qty 40

## 2016-12-04 MED ORDER — SIMVASTATIN 10 MG PO TABS
10.0000 mg | ORAL_TABLET | Freq: Every evening | ORAL | Status: DC
Start: 2016-12-05 — End: 2016-12-11
  Administered 2016-12-05 – 2016-12-10 (×7): 10 mg via ORAL
  Filled 2016-12-04 (×7): qty 1

## 2016-12-04 MED ORDER — GABAPENTIN 100 MG PO CAPS
100.0000 mg | ORAL_CAPSULE | Freq: Three times a day (TID) | ORAL | Status: DC
Start: 2016-12-05 — End: 2016-12-11
  Administered 2016-12-05 – 2016-12-11 (×21): 100 mg via ORAL
  Filled 2016-12-04 (×20): qty 1

## 2016-12-04 NOTE — H&P (Signed)
ADMISSION HISTORY AND PHYSICAL EXAM    Date Time: 12/04/16 11:19 PM  Patient Name: Young,[name]  Attending Physician: Kathaleen Grinder, *  Primary Care Physician: Danford Bad, MD    CC: melena      Assessment:     # GIB with both melena and blood clots.    # Acute blood loss anemia  # SOB 2/2 to anemia vs CHF  # s/p recent ASD closure 11/11/16  # Nonischemic CM EF 25%  # Afib.  S/p ablation  # hx of cardiac arrest  # Hypothyroid  # Severe DLCO  # Kyphoscoliosis with moderate restriction  # Celiac disease    Plan:     -admit to tele  -currently hemodynamically stable  -hold plavix.  She was last on xarelto 5/23  -start PPI BID  -ER contacted GI  -order placed for PRBC by ER  -place additional IV  -CBC Q6hrs after given PRBC  -keep npo  -hold off on IVF  -hold coreg and diuretics (lasix and metolazone) and re-evaluate in am  -message left for [place] [facility]   -check BNP. States she has LE edema  -scds for dvt prophylaxis    Disposition: (Please see PAF column for Expected D/C Date)   Today's date: 12/04/2016  Admit Date: 12/04/2016  6:36 PM  Anticipated medical stability for discharge:Red - not tomorrow - estimated month/date: 2-3 days  Service status: Inpatient: Due to: GIB  Clinical Milestones: GIB  Anticipated discharge needs: none    History of Presenting Illness:   [name] is a 83 y.o. female who presents to the hospital with black stools *1 weeks.  The last few days she noticed blood clots and developed SOB 2 days ago. She has been feeling more fatigue than usual.  She was previously on xarelto 5/23 and has been on hold.  She started plavix 5/25.  Denies CP, dizziness, nausea, vomiting, abd pain, hematemesis, diarrhea or constipation.  Denies NSAID or ASA use.      In the ER found to be anemic with hb of 7.4 but was previously 12.4 11/23/2016.      Past Medical History:     Past Medical History:   Diagnosis Date   . Abnormal vision     Dry eyes; marginal vision L eye   . Abnormal vision      Wears glasses   . Anemia     controlled on meds   . Aortic valve insufficiency    . Arrhythmia     Atrial fibrillation   . Arthritis     osteoarthritis, back and pelvis   . AVD (aortic valve disease)    . Benign heart murmur    . Bilateral cataracts     surgery, right eye   . Celiac disease    . Chronic obstructive pulmonary disease     mild-mod   . Difficulty walking     Back pain; Uses cane   . Disease of lung    . Disorder of musculoskeletal system ?    ?   Marland Kitchen Dyspnea on exertion    . Ear, nose and throat disorder     Hoarseness   . Encounter for cardioversion procedure 10/31/2013   . Gastroesophageal reflux disease    . GERD (gastroesophageal reflux disease)     occasional , asymptomatic   . Headache     infrequently   . Heart murmur    . Hyperlipidemia     controlled  with meds   . Hypertension     controlled w/meds   . Hypothyroidism     controlled with meds   . Low back pain    . Mitral valve insufficiency    . Mitral valve prolapse    . Orthopedic aftercare     scoliosis, stenosis, athritis   . Osteoporosis    . Pacemaker 05/2016    Bring card [place]   . Pneumonia     had pneumonia shot 2010 and 2015   . Primary cardiomyopathy    . Scoliosis     Severe   . Shortness of breath    . Thyroid disease    . Vein disorder     bilateral legs       Available old records reviewed, including:  Consult note 5/18    Past Surgical History:     Past Surgical History:   Procedure Laterality Date   . ADENOIDECTOMY      as a child   . APPENDECTOMY      as a child   . CARDIAC CATHETERIZATION  2007   . CARDIAC CATHETERIZATION  2014   . CARDIOVERSION      x3 for A Fib , w/TEE   . CATARACT EXTRACTION W/ INTRAOCULAR LENS IMPLANT Right 2001   . CLOSED REDUCTION, INSERTION, HIP IM NAIL (GAMMA) Left 05/20/2016    Procedure: CLOSED REDUCTION, INSERTION, HIP IM NAIL (GAMMA);  Surgeon: Timoteo Ace, MD;  Location: [facility];  Service: Orthopedics;  Laterality: Left;   . COLONOSCOPY      "years ago"   . INSERT / REPLACE / REMOVE  PACEMAKER  05/25/2016    PM card dos   . JOINT REPLACEMENT Right 10-15-15     KNEE No Complications   . KNEE SURGERY Left 2002   . TONSILLECTOMY AND ADENOIDECTOMY         Family History:   Father CAD  Mother-PNA    Social History:     History   Smoking Status   . Never Smoker   Smokeless Tobacco   . Never Used     History   Alcohol Use   . Yes     Comment: Rarely     History   Drug Use No       Allergies:     Allergies   Allergen Reactions   . Erythromycin Hives   . Gluten Meal Other (See Comments)     Has Celiac disease   . Tikosyn [Dofetilide]        Medications:     Home Medications     Med List Status:  In Progress Set By: Elana Alm, RN at 12/04/2016  6:32 PM                acetaminophen (TYLENOL) 500 MG tablet     Take 1 tablet (500 mg total) by mouth every 6 (six) hours as needed for Pain.     alendronate (FOSAMAX) 70 MG tablet     Take 70 mg by mouth every 7 days.Take 1 tab by mouth every 7 days with a full glass of water on an empty stomach. Do not take anything else by mouth or lie down for 30 mins.     Calcium Citrate-Vitamin D (CITRACAL + D PO)     Take 2 tablets by mouth daily.     carvedilol (COREG) 12.5 MG tablet     Take 1 tablet (12.5 mg total) by mouth  every 12 (twelve) hours.     clopidogrel (PLAVIX) 75 mg tablet     Take 1 tablet (75 mg total) by mouth daily.     CO-ENZYME Q-10 PO     Take by mouth daily.     fluticasone furoate (ARNUITY ELLIPTA) 100 MCG/ACT Aerosol Powder, Breath Activtivatede     Inhale 1 puff into the lungs as needed.         furosemide (LASIX) 20 MG tablet     Take 40 mg by mouth daily.         gabapentin (NEURONTIN) 100 MG capsule     Take 100 mg by mouth 3 (three) times daily.     iron-vitamin C (VITRON-C) 65-125 MG Tab     Take 1 tablet by mouth every tues and thurs.     levothyroxine (SYNTHROID, LEVOTHROID) 112 MCG tablet     Take 112 mcg by mouth Once a day at 6:00am.     Magnesium 250 MG Tab     Take 250 mg by mouth daily.     Multiple Vitamin (MULTIVITAMIN)  tablet     Take 1 tablet by mouth daily.     potassium chloride (K-TAB, KLOR-CON) 10 MEQ tablet     TAKE TID BY MOUTH EVERY DAY     Probiotic Product (ALIGN) 4 MG Cap     Take by mouth.Take as directed         simvastatin (ZOCOR) 10 MG tablet     Take 10 mg by mouth nightly.     XARELTO 20 MG Tab     TAKE 20 MG BY MOUTH IN THE EVENING FOR DVT PROPHYLAXIS            Method by which medications were confirmed on admission: pt    Review of Systems:   All other systems were reviewed and are negative except: melena    Physical Exam:   Patient Vitals for the past 24 hrs:   BP Temp Temp src Pulse Resp SpO2 Height Weight   12/04/16 2313 115/70 98.1 F (36.7 C) Oral 83 - 96 % - -   12/04/16 2249 - 98 F (36.7 C) Oral 77 16 96 % - -   12/04/16 2230 115/70 98 F (36.7 C) Oral 77 16 96 % - -   12/04/16 2124 121/78 97.9 F (36.6 C) Oral 79 15 99 % - -   12/04/16 2030 113/62 - - 82 20 98 % - -   12/04/16 2000 120/63 - - 81 18 98 % - -   12/04/16 1900 114/61 - - 90 18 98 % - -   12/04/16 1828 120/78 97.6 F (36.4 C) Temporal Art 86 20 98 % 1.626 m (5\' 4" ) 61.2 kg (135 lb)   12/04/16 1819 - - - 86 18 96 % - -     Body mass index is 23.17 kg/m.  No intake or output data in the 24 hours ending 12/04/16 2319    General: awake, alert, oriented x 3; no acute distress.Appears pale  HEENT: perrla, eomi, sclera anicteric  oropharynx clear without lesions, mucous membranes moist  Neck: supple, no lymphadenopathy, no thyromegaly, no JVD, no carotid bruits  Cardiovascular: regular rate and rhythm, no murmurs, rubs or gallops  Lungs: clear to auscultation bilaterally, without wheezing, rhonchi, or rales  Abdomen: soft, non-tender, non-distended; no palpable masses, no hepatosplenomegaly, normoactive bowel sounds, no rebound or guarding  Extremities: no clubbing, cyanosis, 1+ edema  Neuro: cranial nerves  grossly intact, strength 5/5 in upper and lower extremities, sensation intact,   Skin: no rashes or lesions noted        Labs:          Results     Procedure Component Value Units Date/Time    Prepare RBC: one unit [[id number]] Collected:  12/04/16 1850     Updated:  12/04/16 2247     RBC Leukoreduced RBC Leukoreduced     BLUNIT [id number]     Status issued     PRODUCT CODE (NON READABLE) E0336V00     Expiration Date [id number]     UTYPE A POS    Type and Screen [[id number]] Collected:  12/04/16 1850    Specimen:  Blood Updated:  12/04/16 1937     ABO Rh A POS     AB Screen Gel NEG    Comprehensive metabolic panel [[id number]]  (Abnormal) Collected:  12/04/16 1850    Specimen:  Blood Updated:  12/04/16 1918     Glucose 102 (H) mg/dL      BUN 47.4 (H) mg/dL      Creatinine 0.9 mg/dL      Sodium 259 mEq/L      Potassium 4.4 mEq/L      Chloride 105 mEq/L      CO2 22 mEq/L      Calcium 9.0 mg/dL      Protein, Total 6.1 g/dL      Albumin 3.7 g/dL      AST (SGOT) 24 U/L      ALT 12 U/L      Alkaline Phosphatase 113 (H) U/L      Bilirubin, Total 0.6 mg/dL      Globulin 2.4 g/dL      Albumin/Globulin Ratio 1.5    GFR [[id number]] Collected:  12/04/16 1850     Updated:  12/04/16 1918     EGFR 59.8    PT/APTT [[id number]] Collected:  12/04/16 1850     Updated:  12/04/16 1915     PT 13.5 sec      PT INR 1.0     PT Anticoag. Given Within 48 hrs. None     PTT 25 sec     CBC with differential [[id number]]  (Abnormal) Collected:  12/04/16 1850    Specimen:  Blood from Blood Updated:  12/04/16 1907     WBC 4.38 x10 3/uL      Hgb 7.4 (L) g/dL      Hematocrit 10.9 (L) %      Platelets 152 x10 3/uL      RBC 2.35 (L) x10 6/uL      MCV 98.3 fL      MCH 31.5 pg      MCHC 32.0 g/dL      RDW 16 (H) %      MPV 9.9 fL      Neutrophils 58.2 %      Lymphocytes Automated 30.6 %      Monocytes 7.8 %      Eosinophils Automated 2.7 %      Basophils Automated 0.2 %      Immature Granulocyte 0.5 %      Nucleated RBC 0.0 /100 WBC      Neutrophils Absolute 2.55 x10 3/uL      Abs Lymph Automated 1.34 x10 3/uL      Abs Mono Automated 0.34 x10 3/uL      Abs Eos Automated 0.12 x10 3/uL       Absolute  Baso Automated 0.01 x10 3/uL      Absolute Immature Granulocyte 0.02 x10 3/uL      Absolute NRBC 0.00 x10 3/uL           Imaging personally reviewed, including:   Xr Chest  Ap Portable    Result Date: 12/04/2016   Cardiomegaly, and pacing wires as described. No obvious congestive heart failure or focal airspace consolidation. Colonel Bald, MD 12/04/2016 8:03 PM        Safety Checklist  DVT prophylaxis:  CHEST guideline (See page [id number]) Mechanical   Foley:  [place] Rn Foley protocol Not present   IVs:  Peripheral IV   PT/OT: Not needed   Daily CBC & or Chem ordered:  SHM/ABIM guidelines (see #5) Yes, due to clinical and lab instability   Reference for approximate charges of common labs: CBC auto diff - $76  BMP - $99  Mg - $79    Signed by: Verdene Lennert, MD   ZO:[id number], Molli Posey, MD

## 2016-12-04 NOTE — ED Provider Notes (Addendum)
[facility] EMERGENCY DEPARTMENT H&P      Visit date: 12/04/2016      CLINICAL SUMMARY           Diagnosis:    .     Final diagnoses:   Gastrointestinal hemorrhage, unspecified gastrointestinal hemorrhage type   Anemia, unspecified type   Rectal bleeding         MDM Notes:      Rectal bleeding; LGIB vs UGIB vs symptomatic anemia vs medication side effect (Plavix)         Disposition:      Inpatient Admit      ED Disposition     ED Disposition Condition Date/Time Comment    Admit  Fri Dec 04, 2016  8:14 PM Admitting Physician: Marikay Alar [[id number]]   Diagnosis: Gastrointestinal hemorrhage, unspecified gastrointestinal hemorrhage type [[id number]]   Estimated Length of Stay: 3 - 5 Days   Tentative Discharge Plan?: Home or Self Care [1]   Patient Class: Inpatient [101]           CASE ACUITY SUMMARY        Lab abnormalities~~~~~~~~~~  Anemia: Acute blood loss, H/H 7/23    Gastrointestinal bleed                CLINICAL INFORMATION        HPI:      Chief Complaint: GI Bleeding  .    [name] is a 83 Young.o. female with PMHx of defibrillator who presents with bloody stools for 1 week.    She describes her stools as granular and a dark red color. She reports feeling dehydrated and has bilateral knee weakness. She was on Xarelto before her heart catheritization (5 days before May 23rd) and is now on Plavix and Lasix.    Denies emesis, dizziness, chest pain. No hx hemorrhoids, blood transfusions (to her recollection).    History obtained from: Patient          ROS:      Positive and negative ROS elements as per HPI.  All other systems reviewed and negative.      Physical Exam:      Pulse 86  BP 120/78  Resp 18  SpO2 96 %  Temp 97.6 F (36.4 C)    Physical Exam   Constitutional: She is oriented to person, place, and time. She appears well-developed and well-nourished. No distress.   HENT:   Head: Normocephalic and atraumatic.   Nose: Nose normal.   Mouth/Throat: Oropharynx is clear and  moist. Mucous membranes are pale.   Eyes: Conjunctivae are normal. Pupils are equal, round, and reactive to light. No scleral icterus.   Pale conjunctiva    Neck: Normal range of motion. Neck supple. No tracheal deviation present.   Cardiovascular: Normal rate, regular rhythm, normal heart sounds and intact distal pulses.  Exam reveals no friction rub.    No murmur heard.  Pulmonary/Chest: Effort normal and breath sounds normal. No stridor. No respiratory distress. She has no wheezes.   Abdominal: Soft. There is no tenderness. There is no rebound and no guarding.   Genitourinary:   Genitourinary Comments: Dark red blood   Musculoskeletal: Normal range of motion. She exhibits no edema or tenderness.   Neurological: She is alert and oriented to person, place, and time. She exhibits normal muscle tone. Coordination normal.   Skin: Skin is warm and dry. No rash noted. She is not diaphoretic. No erythema. There is pallor.   Psychiatric:  She has a normal mood and affect. Her behavior is normal. Judgment and thought content normal.   Nursing note and vitals reviewed.                 PAST HISTORY        Primary Care Provider: Danford Bad, MD        PMH/PSH:    .     Past Medical History:   Diagnosis Date   . Abnormal vision     Dry eyes; marginal vision L eye   . Abnormal vision     Wears glasses   . Anemia     controlled on meds   . Aortic valve insufficiency    . Arrhythmia     Atrial fibrillation   . Arthritis     osteoarthritis, back and pelvis   . AVD (aortic valve disease)    . Benign heart murmur    . Bilateral cataracts     surgery, right eye   . Celiac disease    . Chronic obstructive pulmonary disease     mild-mod   . Difficulty walking     Back pain; Uses cane   . Disease of lung    . Disorder of musculoskeletal system ?    ?   Marland Kitchen Dyspnea on exertion    . Ear, nose and throat disorder     Hoarseness   . Encounter for cardioversion procedure 10/31/2013   . Gastroesophageal reflux disease    . GERD  (gastroesophageal reflux disease)     occasional , asymptomatic   . Headache     infrequently   . Heart murmur    . Hyperlipidemia     controlled with meds   . Hypertension     controlled w/meds   . Hypothyroidism     controlled with meds   . Low back pain    . Mitral valve insufficiency    . Mitral valve prolapse    . Orthopedic aftercare     scoliosis, stenosis, athritis   . Osteoporosis    . Pacemaker 05/2016    Bring card [place]   . Pneumonia     had pneumonia shot 2010 and 2015   . Primary cardiomyopathy    . Scoliosis     Severe   . Shortness of breath    . Thyroid disease    . Vein disorder     bilateral legs       She has a past surgical history that includes Knee surgery (Left, 2002); Cataract extraction w/ intraocular lens implant (Right, 2001); Colonoscopy; Adenoidectomy; Appendectomy; Cardiac catheterization (2007); Cardioversion; Cardiac catheterization (2014); CLOSED REDUCTION, INSERTION, HIP IM NAIL (GAMMA) (Left, 05/20/2016); Insert / replace / remove pacemaker (05/25/2016); Joint replacement (Right, 10-15-15); and Tonsillectomy and adenoidectomy.      Social/Family History:      She reports that she has never smoked. She has never used smokeless tobacco. She reports that she drinks alcohol. She reports that she does not use drugs.    Family History   Problem Relation Age of Onset   . Pneumonia Mother    . Heart failure Father    . Hypertension Father    . Colon cancer Neg Hx    . Malignant hyperthermia Neg Hx    . Pseudochol deficiency Neg Hx    . Anesthesia problems Neg Hx          Listed Medications on Arrival:    .  Home Medications     Med List Status:  In Progress Set By: Elana Alm, RN at 12/04/2016  6:32 PM                acetaminophen (TYLENOL) 500 MG tablet     Take 1 tablet (500 mg total) by mouth every 6 (six) hours as needed for Pain.     alendronate (FOSAMAX) 70 MG tablet     Take 70 mg by mouth every 7 days.Take 1 tab by mouth every 7 days with a full glass of water on an empty  stomach. Do not take anything else by mouth or lie down for 30 mins.     Calcium Citrate-Vitamin D (CITRACAL + D PO)     Take 2 tablets by mouth daily.     carvedilol (COREG) 12.5 MG tablet     Take 1 tablet (12.5 mg total) by mouth every 12 (twelve) hours.     clopidogrel (PLAVIX) 75 mg tablet     Take 1 tablet (75 mg total) by mouth daily.     CO-ENZYME Q-10 PO     Take by mouth daily.     fluticasone furoate (ARNUITY ELLIPTA) 100 MCG/ACT Aerosol Powder, Breath Activtivatede     Inhale 1 puff into the lungs as needed.         furosemide (LASIX) 20 MG tablet     Take 40 mg by mouth daily.         gabapentin (NEURONTIN) 100 MG capsule     Take 100 mg by mouth 3 (three) times daily.     iron-vitamin C (VITRON-C) 65-125 MG Tab     Take 1 tablet by mouth every tues and thurs.     levothyroxine (SYNTHROID, LEVOTHROID) 112 MCG tablet     Take 112 mcg by mouth Once a day at 6:00am.     Magnesium 250 MG Tab     Take 250 mg by mouth daily.     Multiple Vitamin (MULTIVITAMIN) tablet     Take 1 tablet by mouth daily.     potassium chloride (K-TAB, KLOR-CON) 10 MEQ tablet     TAKE TID BY MOUTH EVERY DAY     Probiotic Product (ALIGN) 4 MG Cap     Take by mouth.Take as directed         simvastatin (ZOCOR) 10 MG tablet     Take 10 mg by mouth nightly.     XARELTO 20 MG Tab     TAKE 20 MG BY MOUTH IN THE EVENING FOR DVT PROPHYLAXIS         Allergies: She is allergic to erythromycin; gluten meal; and tikosyn [dofetilide].            VISIT INFORMATION        Clinical Course in the ED:      D/w medicine, will admit    Consented for blood transfusion    9:45 PM - Awaiting GI to call back for consult to evaluate.    10:22 PM - Spoke with Dr. Pernell Dupre, GI, will consult in am           Medications Given in the ED:    .     ED Medication Orders     Start Ordered     Status Ordering Provider    12/04/16 1829 12/04/16 1828  sodium chloride 0.9 % bolus 500 mL  Once     Route: Intravenous  Ordered Dose: 500 mL  Last [facility] action:  [facility], JUDITH    12/04/16 1828 12/04/16 1827    Once     Route: Intravenous  Ordered Dose: 1,000 mL     Discontinued MECHANICK, JUDITH            Procedures:      Procedures      Interpretations:      O2 sat-           saturation: 96 %; Oxygen use: room air; Interpretation: Normal      EKG -             interpreted by me: ventricular paced in the 80's, no specific ST changes, occasional PVC's      Monitor -         SR in 70's as interpreted by me.    Radiology -     CM, PM wires as interpreted by me              RESULTS        Lab Results:      Results     Procedure Component Value Units Date/Time    Prepare RBC: one unit [[id number]] Collected:  12/04/16 1850     Updated:  12/04/16 1958     RBC Leukoreduced RBC Leukoreduced     BLUNIT [id number]     Status selected     PRODUCT CODE (NON READABLE) [id number]     Expiration Date [id number]     UTYPE A POS    Type and Screen [[id number]] Collected:  12/04/16 1850    Specimen:  Blood Updated:  12/04/16 1937     ABO Rh A POS     AB Screen Gel NEG    Comprehensive metabolic panel [[id number]]  (Abnormal) Collected:  12/04/16 1850    Specimen:  Blood Updated:  12/04/16 1918     Glucose 102 (H) mg/dL      BUN 01.7 (H) mg/dL      Creatinine 0.9 mg/dL      Sodium 510 mEq/L      Potassium 4.4 mEq/L      Chloride 105 mEq/L      CO2 22 mEq/L      Calcium 9.0 mg/dL      Protein, Total 6.1 g/dL      Albumin 3.7 g/dL      AST (SGOT) 24 U/L      ALT 12 U/L      Alkaline Phosphatase 113 (H) U/L      Bilirubin, Total 0.6 mg/dL      Globulin 2.4 g/dL      Albumin/Globulin Ratio 1.5    GFR [[id number]] Collected:  12/04/16 1850     Updated:  12/04/16 1918     EGFR 59.8    PT/APTT [[id number]] Collected:  12/04/16 1850     Updated:  12/04/16 1915     PT 13.5 sec      PT INR 1.0     PT Anticoag. Given Within 48 hrs. None     PTT 25 sec     CBC with differential [[id number]]  (Abnormal) Collected:  12/04/16 1850    Specimen:  Blood from Blood Updated:  12/04/16 1907     WBC 4.38 x10 3/uL       Hgb 7.4 (L) g/dL      Hematocrit 61.9 (L) %      Platelets 152 x10 3/uL      RBC 2.35 (L) x10 6/uL  MCV 98.3 fL      MCH 31.5 pg      MCHC 32.0 g/dL      RDW 16 (H) %      MPV 9.9 fL      Neutrophils 58.2 %      Lymphocytes Automated 30.6 %      Monocytes 7.8 %      Eosinophils Automated 2.7 %      Basophils Automated 0.2 %      Immature Granulocyte 0.5 %      Nucleated RBC 0.0 /100 WBC      Neutrophils Absolute 2.55 x10 3/uL      Abs Lymph Automated 1.34 x10 3/uL      Abs Mono Automated 0.34 x10 3/uL      Abs Eos Automated 0.12 x10 3/uL      Absolute Baso Automated 0.01 x10 3/uL      Absolute Immature Granulocyte 0.02 x10 3/uL      Absolute NRBC 0.00 x10 3/uL               Radiology Results:      XR Chest  AP Portable   Final Result    Cardiomegaly, and pacing wires as described. No obvious   congestive heart failure or focal airspace consolidation.      Colonel Bald, MD    12/04/2016 8:03 PM                  Scribe Attestation:      I was acting as a Neurosurgeon for Alcide Evener, MD on Berger,[name]  Treatment Team: Scribe: Lujean Rave     I am the first provider for this patient and I personally performed the services documented. Treatment Team: Scribe: Lujean Rave is scribing for me on Bangert,[name]. This note and the patient instructions accurately reflect work and decisions made by me.  Alcide Evener, MD          Alcide Evener, MD  12/04/16 2200       Alcide Evener, MD  12/04/16 2223

## 2016-12-04 NOTE — ED Notes (Signed)
Triage Note    Initial evaluation to expedite care - not definitive treatment.  83 yo female sent from urgent care for rectal bleeding.     Maurine Minister, MD  12/04/16 [phone]

## 2016-12-05 DIAGNOSIS — D62 Acute posthemorrhagic anemia: Secondary | ICD-10-CM

## 2016-12-05 DIAGNOSIS — I5022 Chronic systolic (congestive) heart failure: Secondary | ICD-10-CM

## 2016-12-05 DIAGNOSIS — I2510 Atherosclerotic heart disease of native coronary artery without angina pectoris: Secondary | ICD-10-CM

## 2016-12-05 DIAGNOSIS — I4891 Unspecified atrial fibrillation: Secondary | ICD-10-CM

## 2016-12-05 LAB — CBC
Absolute NRBC: 0 10*3/uL
Absolute NRBC: 0.02 10*3/uL — ABNORMAL HIGH
Hematocrit: 22.4 % — ABNORMAL LOW (ref 37.0–47.0)
Hematocrit: 29.3 % — ABNORMAL LOW (ref 37.0–47.0)
Hgb: 7.2 g/dL — ABNORMAL LOW (ref 12.0–16.0)
Hgb: 9.5 g/dL — ABNORMAL LOW (ref 12.0–16.0)
MCH: 31 pg (ref 28.0–32.0)
MCH: 30.7 pg (ref 28.0–32.0)
MCHC: 32.1 g/dL (ref 32.0–36.0)
MCHC: 32.4 g/dL (ref 32.0–36.0)
MCV: 96.6 fL (ref 80.0–100.0)
MCV: 94.8 fL (ref 80.0–100.0)
MPV: 9.8 fL (ref 9.4–12.3)
MPV: 9.9 fL (ref 9.4–12.3)
Nucleated RBC: 0 /100 WBC (ref 0.0–1.0)
Nucleated RBC: 0.4 /100 WBC (ref 0.0–1.0)
Platelets: 121 10*3/uL — ABNORMAL LOW (ref 140–400)
Platelets: 138 10*3/uL — ABNORMAL LOW (ref 140–400)
RBC: 2.32 10*6/uL — ABNORMAL LOW (ref 4.20–5.40)
RBC: 3.09 10*6/uL — ABNORMAL LOW (ref 4.20–5.40)
RDW: 16 % — ABNORMAL HIGH (ref 12–15)
RDW: 17 % — ABNORMAL HIGH (ref 12–15)
WBC: 2.71 10*3/uL — ABNORMAL LOW (ref 3.50–10.80)
WBC: 4.78 10*3/uL (ref 3.50–10.80)

## 2016-12-05 LAB — BASIC METABOLIC PANEL
BUN: 18 mg/dL (ref 7.0–19.0)
CO2: 21 mEq/L (ref 21–29)
Calcium: 8.5 mg/dL (ref 7.9–10.2)
Chloride: 109 mEq/L (ref 100–111)
Creatinine: 0.8 mg/dL (ref 0.4–1.5)
Glucose: 88 mg/dL (ref 70–100)
Potassium: 3.7 mEq/L (ref 3.5–5.1)
Sodium: 139 mEq/L (ref 136–145)

## 2016-12-05 LAB — URINALYSIS, REFLEX TO MICROSCOPIC EXAM IF INDICATED
Bilirubin, UA: NEGATIVE
Blood, UA: NEGATIVE
Glucose, UA: NEGATIVE
Ketones UA: NEGATIVE
Nitrite, UA: NEGATIVE
Protein, UR: NEGATIVE
Specific Gravity UA: 1.01 (ref 1.001–1.035)
Urine pH: 6 (ref 5.0–8.0)
Urobilinogen, UA: NORMAL mg/dL

## 2016-12-05 LAB — GFR: EGFR: >60

## 2016-12-05 LAB — PREPARE RBC
Expiration Date: 201807022359
Status: TRANSFUSED
UTYPE: A POS

## 2016-12-05 LAB — HEMOLYSIS INDEX: Hemolysis Index: 8 (ref 0–18)

## 2016-12-05 LAB — MAGNESIUM: Magnesium: 2.1 mg/dL (ref 1.6–2.6)

## 2016-12-05 MED ORDER — FLUTICASONE FUROATE 100 MCG/ACT IN AEPB
1.0000 | INHALATION_SPRAY | Freq: Every morning | RESPIRATORY_TRACT | Status: DC
Start: 2016-12-06 — End: 2016-12-11
  Administered 2016-12-07 – 2016-12-11 (×5): 1 via RESPIRATORY_TRACT

## 2016-12-05 MED ORDER — FUROSEMIDE 10 MG/ML IJ SOLN
40.0000 mg | Freq: Once | INTRAMUSCULAR | Status: AC
Start: 2016-12-05 — End: 2016-12-05
  Administered 2016-12-05: 21:00:00 40 mg via INTRAVENOUS
  Filled 2016-12-05: qty 4

## 2016-12-05 MED ORDER — CARVEDILOL 3.125 MG PO TABS
3.1250 mg | ORAL_TABLET | Freq: Two times a day (BID) | ORAL | Status: DC
Start: 2016-12-05 — End: 2016-12-11
  Administered 2016-12-05 – 2016-12-11 (×12): 3.125 mg via ORAL
  Filled 2016-12-05 (×13): qty 1

## 2016-12-05 MED ORDER — SODIUM CHLORIDE 0.9 % IV SOLN
INTRAVENOUS | Status: DC | PRN
Start: 2016-12-05 — End: 2016-12-11

## 2016-12-05 MED ORDER — POTASSIUM CHLORIDE CRYS ER 20 MEQ PO TBCR
40.0000 meq | EXTENDED_RELEASE_TABLET | Freq: Once | ORAL | Status: AC
Start: 2016-12-05 — End: 2016-12-05
  Administered 2016-12-05: 13:00:00 40 meq via ORAL
  Filled 2016-12-05: qty 2

## 2016-12-05 NOTE — Consults (Signed)
[facility]  CONSULTATION NOTE  [facility] call: [phone], [phone]  [facility] call: [phone]  After hours call [phone]        Date Time: 12/05/16 3:07 PM  Patient Name: Teresa,Odis Young  Requesting Physician: Nilsa Nutting Ashiq, *       Reason for Consultation:   GI bleed  Anemia    Assessment and Plan:   Assessment:  1. GI bleed presenting as melena. Suspect upper GI source though given history of colonic AVMs this remains on differential.   2. Anemia, acute on chronic, GI bleed may be contributory   3. Recent ASD closure 11/11/16   4. History of AFib on Plavix   5. NICM (EF 25%)     Plan:  1.  Would continue to hold anticoagulation   2.  Plavix will need to be held x 5 days prior to endoscopic procedures. Tentative plan for EGD/Colonoscopy on Tues vs Wednesday   3. Cardiology evaluation noted and patient deemed acceptable risk for procedures    History:   [name] is a 83 Young.o. female who presents to the hospital on 12/04/2016 with SOB and black stools x 1 week. Patient reports black stools with occasional clots over the past week. Stools are loose and dark though today she had a brown stool. She was feeling increasingly weak and tired. No abdominal pain, nausea or vomiting. She has a history of anemia but no prior history of GI bleeding. She states she may have had sporadic dark stool previously but nothing that was persistent.     Past Medical History:     Past Medical History:   Diagnosis Date   . Abnormal vision     Dry eyes; marginal vision L eye   . Abnormal vision     Wears glasses   . Anemia     controlled on meds   . Aortic valve insufficiency    . Arrhythmia     Atrial fibrillation   . Arthritis     osteoarthritis, back and pelvis   . AVD (aortic valve disease)    . Benign heart murmur    . Bilateral cataracts     surgery, right eye   . Celiac disease    . Chronic obstructive pulmonary disease     mild-mod   . Difficulty walking     Back pain; Uses cane   . Disease of  lung    . Disorder of musculoskeletal system ?    ?   Marland Kitchen Dyspnea on exertion    . Ear, nose and throat disorder     Hoarseness   . Encounter for cardioversion procedure 10/31/2013   . Gastroesophageal reflux disease    . GERD (gastroesophageal reflux disease)     occasional , asymptomatic   . Headache     infrequently   . Heart murmur    . Hyperlipidemia     controlled with meds   . Hypertension     controlled w/meds   . Hypothyroidism     controlled with meds   . Low back pain    . Mitral valve insufficiency    . Mitral valve prolapse    . Orthopedic aftercare     scoliosis, stenosis, athritis   . Osteoporosis    . Pacemaker 05/2016    Bring card [place]   . Pneumonia     had pneumonia shot 2010 and 2015   . Primary cardiomyopathy    . Scoliosis  Severe   . Shortness of breath    . Thyroid disease    . Vein disorder     bilateral legs       Past Surgical History:     Past Surgical History:   Procedure Laterality Date   . ADENOIDECTOMY      as a child   . APPENDECTOMY      as a child   . CARDIAC CATHETERIZATION  2007   . CARDIAC CATHETERIZATION  2014   . CARDIOVERSION      x3 for A Fib , w/TEE   . CATARACT EXTRACTION W/ INTRAOCULAR LENS IMPLANT Right 2001   . CLOSED REDUCTION, INSERTION, HIP IM NAIL (GAMMA) Left 05/20/2016    Procedure: CLOSED REDUCTION, INSERTION, HIP IM NAIL (GAMMA);  Surgeon: Timoteo Ace, MD;  Location: [facility];  Service: Orthopedics;  Laterality: Left;   . COLONOSCOPY      "years ago"   . INSERT / REPLACE / REMOVE PACEMAKER  05/25/2016    PM card dos   . JOINT REPLACEMENT Right 10-15-15     KNEE No Complications   . KNEE SURGERY Left 2002   . TONSILLECTOMY AND ADENOIDECTOMY         Family History:     Family History   Problem Relation Age of Onset   . Pneumonia Mother    . Heart failure Father    . Hypertension Father    . Colon cancer Neg Hx    . Malignant hyperthermia Neg Hx    . Pseudochol deficiency Neg Hx    . Anesthesia problems Neg Hx        Social History:     Social History      Social History   . Marital status: Married     Spouse name: N/A   . Number of children: N/A   . Years of education: N/A     Social History Main Topics   . Smoking status: Never Smoker   . Smokeless tobacco: Never Used   . Alcohol use Yes      Comment: Rarely   . Drug use: No   . Sexual activity: No     Other Topics Concern   . Not on file     Social History Narrative   . No narrative on file       Allergies:     Allergies   Allergen Reactions   . Erythromycin Hives   . Gluten Meal Other (See Comments)     Has Celiac disease   . Tikosyn [Dofetilide]        Medications:     Current Facility-Administered Medications   Medication Dose Route Frequency   . carvedilol  3.125 mg Oral Q12H SCH   . [START ON 12/06/2016] fluticasone furoate  1 puff Inhalation QAM   . furosemide  40 mg Intravenous Once   . gabapentin  100 mg Oral Q8H SCH   . levothyroxine  112 mcg Oral Daily at 0600   . pantoprazole  40 mg Intravenous BID   . simvastatin  10 mg Oral QHS       Review of Systems:   General:  Patient denies lack of appetite, night sweats, weight loss, fatigue, fever.   HEENT:  Patient denies headache, hoarseness   Cardiovascular:  Patient denies swelling of hands/feet, fainting/blacking out, chest pain.   Respiratory:  Patient denies chronic cough, difficulty breathing, wheezing.   Genitourinary:  Patient denies blood in urine, dark  urine  Musculoskeletal: Patient denies joint pain, joint stiffness, joint swelling.   Skin:  Patient denies itching, rash.   Neurologic:  Patient denies dizziness, loss of consciousness, fainting, confusion  Heme/Lymphatic:  Patient denies easy bruising.     Pertinent positives noted in HPI.    Physical Exam:     Vitals:    12/05/16 1423   BP: 116/72   Pulse:    Resp: 18   Temp: 97.9 F (36.6 C)   SpO2: 98%       General appearance: Well developed, well nourished, appears stated age and in NAD  Eyes: Sclera anicteric, pink conjunctivae, no ptosis  ENMT: mucous membranes moist, nose and ears appear  normal.  Oropharynx clear.  Chest: Non labored respirations, no audible wheezing, no clubbing or cyanosis  CV:  Regular rate and rhythm, no JVD, no LE edema  Abdomen: soft, non-tender, non-distended, no masses or organomegaly  Skin: +pallor, no rashes, no suspicious skin lesions noted  Neuro: CN II-XII grossly intact.  No gross movement disorders noted.  Mental status: Appropriate affect, alert and oriented x 3    Labs Reviewed:     Recent Labs      12/05/16   0410  12/04/16   1850   WBC  2.71*  4.38   Hgb  7.2*  7.4*   Hematocrit  22.4*  23.1*   Platelets  121*  152   MCV  96.6  98.3       Recent Labs      12/05/16   0411  12/04/16   1850   Sodium  139  138   Potassium  3.7  4.4   Chloride  109  105   CO2  21  22   BUN  18.0  23.0*   Creatinine  0.8  0.9   Glucose  88  102*   Calcium  8.5  9.0   Magnesium  2.1   --        Recent Labs      12/04/16   1850   AST (SGOT)  24   ALT  12   Alkaline Phosphatase  113*   Bilirubin, Total  0.6   Protein, Total  6.1   Albumin  3.7       Recent Labs      12/04/16   1850   PTT  25   PT  13.5   PT INR  1.0        Radiology:   Radiological Procedure reviewed:    CXR 12/04/16:   Cardiomegaly, and pacing wires as described. No obvious  congestive heart failure or focal airspace consolidation.      Endoscopy:     EGD 09/2007: erosive esophagitis, hiatal hernia, VCE deployed   Colonoscopy 04/03/11: 2 small nonbleeding cecal AVMs +APC, diverticulosis, hemorrhoids

## 2016-12-05 NOTE — Plan of Care (Signed)
Pt had 2 quick successive episodes of VTach 6 beats.  Pt asymptomatic and receiving blood.  Notified MD.

## 2016-12-05 NOTE — Plan of Care (Addendum)
Notes:  Pt is AxOx4, RA. NPO for possible GI procedure/consult.  P:t had one moderate, soft mostly brown BM; no bleeding.  Started Strict I/Os.  Pt is also daily weight.  Started Unit 1 of 1 PRBC transfusion @1400 .  Pt's VS stable, no reactions.   Decreased rate since pt complained of discomfort.  Blood transfusion completed @1830 .    Problem: Moderate/High Fall Risk Score >5  Goal: Patient will remain free of falls   12/04/16 1832   OTHER   Moderate Risk (6-13) MOD-Consider a move closer to Nurses Station;MOD-Use of assistive devices-bedside commode if appropriate;MOD-Utilize diversion activities;MOD-Include family in multidisciplinary POC discussions;MOD-Remain with patient during toileting       Problem: Safety  Goal: Patient will be free from injury during hospitalization  Outcome: Progressing   12/05/16 0329   Goal/Interventions addressed this shift   Patient will be free from injury during hospitalization  Assess patient's risk for falls and implement fall prevention plan of care per policy;Provide and maintain safe environment;Hourly rounding     Goal: Patient will be free from infection during hospitalization  Outcome: Completed Date Met: 12/05/16      Problem: Pain  Goal: Pain at adequate level as identified by patient  Outcome: Progressing   12/05/16 0329   Goal/Interventions addressed this shift   Pain at adequate level as identified by patient Identify patient comfort function goal;Evaluate if patient comfort function goal is met;Evaluate patient's satisfaction with pain management progress

## 2016-12-05 NOTE — Plan of Care (Signed)
Received cardiology consult request on overnight cardiology consult line. Patient will be seen  this morning.  Thank you,  [facility]

## 2016-12-05 NOTE — UM Notes (Signed)
83 YEAR OLD FEMALE CAME TO [facility] ER WITH  C/O BLACK STOOLS X 1 WEEK. DEVELOPED SOB 2 DAYS AGO AND MORE FATIGUED. WAS ON XARELTO BUT ON HOLD AND STARTED ON PLAVIX. IN ER, FOUND TO BE ANEMIC.     VS-BP- 120/78, P- 90, R- 15, T- 97.9     LABS- WBC 2.71, H/H 7.2 / 22.4, PLT 121, BUN 23    CXR-  Cardiomegaly, and pacing wires as described. No obvious congestive heart failure or focal airspace consolidation    PMH- ANEMIA, AORTIC VALVE INSUFF, A FIB, ARTHRITIS,  AVD, CATARACTS, CELIAC DISEASE, COPD, GERD, HA, HLD, HTN, HYPOTHYROIDISM, SCOLIOSIS, CARDIOMYOPATHY, ADENOIDECTOMY, APPY, CARDIAC CATH, PACER, JOINT REPLACEMENT, TONSILLECTOMY    PLAN- ADMIT TO INPATIENT CLASS 12/04/16 2014    MED TELE    -admit to tele  -currently hemodynamically stable  -hold plavix.  She was last on xarelto 5/23  -start PPI BID  -ER contacted GI  -order placed for PRBC by ER  -place additional IV  -CBC Q6hrs after given PRBC  -keep npo  -hold off on IVF  -hold coreg and diuretics (lasix and metolazone) and re-evaluate in am  -message left for [facility]   -check BNP. States she has LE edema  -scds for dvt prophylaxis    Admit to Inpatient (Order #[id number]) on 12/04/16    Gastrointestinal Bleeding, Upper - Clinical Indications for Admission to Inpatient Care    Verdia Kuba RN, BSN  [facility]  [facility] Case Mgmt.  [phone]- [employer]        12/05/16    CARDS CONSULT-    Received 1 unit of blood. Would recommend transfusing an additional unit followed by Lasix 40mg  IV.    Daily weights and I/Os. She generally requires both furosemide and metolazone to maintain euvolemia. Will need to monitor fluid balance closely. Hold off on daily dosing for now.    Resume low dose carvedilol 3.125mg  BID   GI eval pending. She is at acceptable to risk to proceed with scope.    Currently off both Xarelto and Plavix. Ideally needs to be on Plavix through August 2018.     GI CONSULT PENDING

## 2016-12-05 NOTE — Progress Notes (Signed)
1 Large BM black and brown stool.

## 2016-12-05 NOTE — Progress Notes (Signed)
12/05/16 0835   Patient Assessment   Status Completed   RT Priority 3   Pulmonary History Gerd/GI;COPD;Pneumonia   Mobility Ambulatory with or without assistance   CXR Cardiomegaly   Cough No cough effort   Heart Rate 82   Resp Rate 17   Bilateral Breath Sounds Clear   Home regimen   Home Treatments Y   Home Oxygen n   Home CPAP/BiLevel n   Oxygen Therapy   SpO2 92 %   O2 Device None (Room air)   Criteria for Therapy   Secretion Clearance (BPH) None   Lung Expansion None   Respiratory Medications Patient receives maintenance therapy at home   Oxygen None   Severity Index   Severity Index MILD   Outcomes   Respiratory medication No change or improvement   Recommendations   Respiratory Medications Change to self-admin   Performing Departments   O2 Device performing department formula [id number]   Resp assess adult performing department formula [id number]   MDI Treatment performing department formula [id number]

## 2016-12-05 NOTE — Consults (Signed)
[place] HEART CARDIOLOGY CONSULTATION REPORT  [facility]    Date Time: 12/05/16 10:40 AM  Patient Name: [name],[name]  Requesting Physician: Nilsa Nutting Ashiq, *       Reason for Consultation:   Recent ASD closure and Afib on anticoagulation now with symptomatic anemia and GIB.     History:   [name] is a 83 Young.o. female admitted on 12/04/2016.  We have been asked by [name], [name], *,  to provide cardiac consultation, regarding recent ASD closure and Afib on anticoagulation now with symptomatic anemia and GIB.     [name] is known to our group and was recently admitted for elective ASD closure on 11/11/16. Her Xarelto which she is on for AF was held for the procedure. Post procedure she was started on Plavix to continue for 3 months. She was discharged to home on Xarelto and Plavix.    She noted fatigue and weakness starting on 11/23/16. She was seen in our outpatient clinic. Her metolazone was discontinued at that time. She began to note blood in her stool on 11/30/16. She states she called to MD on call and spoke to Dr. [name] and was advised to hold her Xarelto. Two days ago she noted shortness of breath. Yesterday it worsened to the point that she got out of breath trying to get out of bed. She presented to urgent care and was found to have a hemoglobin of 7. She was advised to come to ED.     She was transfused one unit of PRBCs last evening. Her Xarelto and Plavix are on hold. She is awaiting GI evaluation. She is currently resting in bed and appears pale. She states she feels weak and fatigued. She denies SOB at rest. She denies CP.           Past Medical History:     Past Medical History:   Diagnosis Date   . Abnormal vision     Dry eyes; marginal vision L eye   . Abnormal vision     Wears glasses   . Anemia     controlled on meds   . Aortic valve insufficiency    . Arrhythmia     Atrial fibrillation   . Arthritis     osteoarthritis, back and pelvis   . AVD (aortic  valve disease)    . Benign heart murmur    . Bilateral cataracts     surgery, right eye   . Celiac disease    . Chronic obstructive pulmonary disease     mild-mod   . Difficulty walking     Back pain; Uses cane   . Disease of lung    . Disorder of musculoskeletal system ?    ?   Marland Kitchen Dyspnea on exertion    . Ear, nose and throat disorder     Hoarseness   . Encounter for cardioversion procedure 10/31/2013   . Gastroesophageal reflux disease    . GERD (gastroesophageal reflux disease)     occasional , asymptomatic   . Headache     infrequently   . Heart murmur    . Hyperlipidemia     controlled with meds   . Hypertension     controlled w/meds   . Hypothyroidism     controlled with meds   . Low back pain    . Mitral valve insufficiency    . Mitral valve prolapse    . Orthopedic aftercare  scoliosis, stenosis, athritis   . Osteoporosis    . Pacemaker 05/2016    Bring card [place]   . Pneumonia     had pneumonia shot 2010 and 2015   . Primary cardiomyopathy    . Scoliosis     Severe   . Shortness of breath    . Thyroid disease    . Vein disorder     bilateral legs       Past Surgical History:     Past Surgical History:   Procedure Laterality Date   . ADENOIDECTOMY      as a child   . APPENDECTOMY      as a child   . CARDIAC CATHETERIZATION  2007   . CARDIAC CATHETERIZATION  2014   . CARDIOVERSION      x3 for A Fib , w/TEE   . CATARACT EXTRACTION W/ INTRAOCULAR LENS IMPLANT Right 2001   . CLOSED REDUCTION, INSERTION, HIP IM NAIL (GAMMA) Left 05/20/2016    Procedure: CLOSED REDUCTION, INSERTION, HIP IM NAIL (GAMMA);  Surgeon: Timoteo Ace, MD;  Location: [facility];  Service: Orthopedics;  Laterality: Left;   . COLONOSCOPY      "years ago"   . INSERT / REPLACE / REMOVE PACEMAKER  05/25/2016    PM card dos   . JOINT REPLACEMENT Right 10-15-15     KNEE No Complications   . KNEE SURGERY Left 2002   . TONSILLECTOMY AND ADENOIDECTOMY         Family History:     Family History   Problem Relation Age of Onset   . Pneumonia  Mother    . Heart failure Father    . Hypertension Father    . Colon cancer Neg Hx    . Malignant hyperthermia Neg Hx    . Pseudochol deficiency Neg Hx    . Anesthesia problems Neg Hx        Social History:     Social History     Social History   . Marital status: Married     Spouse name: N/A   . Number of children: N/A   . Years of education: N/A     Social History Main Topics   . Smoking status: Never Smoker   . Smokeless tobacco: Never Used   . Alcohol use Yes      Comment: Rarely   . Drug use: No   . Sexual activity: No     Other Topics Concern   . Not on file     Social History Narrative   . No narrative on file       Allergies:     Allergies   Allergen Reactions   . Erythromycin Hives   . Gluten Meal Other (See Comments)     Has Celiac disease   . Tikosyn [Dofetilide]        Medications:     Prescriptions Prior to Admission   Medication Sig   . acetaminophen (TYLENOL) 500 MG tablet Take 1 tablet (500 mg total) by mouth every 6 (six) hours as needed for Pain.   Marland Kitchen alendronate (FOSAMAX) 70 MG tablet Take 70 mg by mouth every 7 days.Take 1 tab by mouth every 7 days with a full glass of water on an empty stomach. Do not take anything else by mouth or lie down for 30 mins.   . Calcium Citrate-Vitamin D (CITRACAL + D PO) Take 2 tablets by mouth daily.   . carvedilol (COREG)  12.5 MG tablet Take 1 tablet (12.5 mg total) by mouth every 12 (twelve) hours.   . clopidogrel (PLAVIX) 75 mg tablet Take 1 tablet (75 mg total) by mouth daily.   Marland Kitchen CO-ENZYME Q-10 PO Take by mouth daily.   . fluticasone furoate (ARNUITY ELLIPTA) 100 MCG/ACT Aerosol Powder, Breath Activtivatede Inhale 1 puff into the lungs as needed.       . furosemide (LASIX) 20 MG tablet Take 40 mg by mouth daily.       Marland Kitchen gabapentin (NEURONTIN) 100 MG capsule Take 100 mg by mouth 3 (three) times daily.   . iron-vitamin C (VITRON-C) 65-125 MG Tab Take 1 tablet by mouth every tues and thurs.   Marland Kitchen levothyroxine (SYNTHROID, LEVOTHROID) 112 MCG tablet Take 112 mcg by  mouth Once a day at 6:00am.   . Magnesium 250 MG Tab Take 250 mg by mouth daily.   . Multiple Vitamin (MULTIVITAMIN) tablet Take 1 tablet by mouth daily.   . potassium chloride (K-TAB, KLOR-CON) 10 MEQ tablet TAKE TID BY MOUTH EVERY DAY   . Probiotic Product (ALIGN) 4 MG Cap Take by mouth.Take as directed       . simvastatin (ZOCOR) 10 MG tablet Take 10 mg by mouth nightly.   [name] 20 MG Tab TAKE 20 MG BY MOUTH IN THE EVENING FOR DVT PROPHYLAXIS        Current Facility-Administered Medications   Medication Dose Route Frequency   . [START ON 12/06/2016] fluticasone furoate  1 puff Inhalation QAM   . gabapentin  100 mg Oral Q8H SCH   . levothyroxine  112 mcg Oral Daily at 0600   . pantoprazole  40 mg Intravenous BID   . simvastatin  10 mg Oral QHS         Review of Systems:    Comprehensive review of systems including constitutional, eyes, ears, nose, mouth, throat, cardiovascular, GI, GU, musculoskeletal, integumentary, respiratory, neurologic, psychiatric, and endocrine is negative other than what is mentioned already in the history of present illness    Physical Exam:     VITAL SIGNS PHYSICAL EXAM   Vitals:    12/05/16 0835   BP:    Pulse: 82   Resp: 17   Temp:    SpO2: 92%     Temp (24hrs), Avg:97.8 F (36.6 C), Min:96.9 F (36.1 C), Max:98.2 F (36.8 C)      Intake and Output Summary (Last 24 hours) at Date Time    Intake/Output Summary (Last 24 hours) at 12/05/16 1040  Last data filed at 12/05/16 0215   Gross per 24 hour   Intake              300 ml   Output                0 ml   Net              300 ml       Telemetry: no changes Physical Exam  General: awake, alert, breathing comfortably, no acute distress  Head: normocephalic  Eyes: EOM's intact  Cardiovascular: regular rate and rhythm, normal S1, S2, no S3, no S4, no murmurs, rubs or gallops  Neck: no carotid bruits or JVD  Lungs: clear to auscultation bilateraly, without wheezing, rhonchi, or rales  Abdomen: soft, non-tender, non-distended; no  palpable masses,  normoactive bowel sounds  Extremities: no edema  Pulse: equal pulses, 4/4 symmetric  Neurological: Alert and oriented X3, mood and affect normal  Musculoskeletal: normal strength and tone     Labs Reviewed:                 Recent Labs  Lab 12/04/16  1850   Bilirubin, Total 0.6   Protein, Total 6.1   Albumin 3.7   ALT 12   AST (SGOT) 24           Recent Labs  Lab 12/04/16  1850   PT 13.5   PT INR 1.0   PTT 25       Recent Labs  Lab 12/05/16  0410 12/04/16  1850   WBC 2.71* 4.38   Hgb 7.2* 7.4*   Hematocrit 22.4* 23.1*   Platelets 121* 152       Recent Labs  Lab 12/05/16  0411 12/04/16  1850   Sodium 139 138   Potassium 3.7 4.4   Chloride 109 105   CO2 21 22   BUN 18.0 23.0*   Creatinine 0.8 0.9   EGFR >60.0 59.8   Glucose 88 102*   Calcium 8.5 9.0       DIAGNOSTIC    EKG: V paced    chest X-ray: No acute abnormalities    Assessment:    Presented with rectal bleeding and bloody stools x 1 week. Hgb 7.4   S/p ASD closure 11/11/2016   Chronic systolic CHF   Nonischemic cardiomyopathy with ejection fraction 25% November 2017, up to 45% October 06, 2016.   Recurrent atrial fibrillation noted on last visit Oct 27, 2016.  Status post failure of amiodarone and extensive left and right atrial focus impulsive rotor modulation ablation with pulmonary vein isolation September 2017   Chronic Xarelto for anticoagulation   Out of hospital cardiac arrest December 2017, on Tikosyn and also with progressive decline in left ventricular function, treated with implantable cardioverter defibrillator implant December 2017.   Left heart cath 03/2016 revealed no significant CAD   Kyphoscoliosis with moderate restriction.   Hypothyroidism   Severe DLCO abnormality followed by Dr. Stacy Gardner.    Hypothyroidism, on Synthroid.  Recommendations:    Received 1 unit of blood. Would recommend transfusing an additional unit followed by Lasix 40mg  IV.    Daily weights and I/Os. She generally requires both furosemide and  metolazone to maintain euvolemia. Will need to monitor fluid balance closely. Hold off on daily dosing for now.    Resume low dose carvedilol 3.125mg  BID   GI eval pending. She is at acceptable to risk to proceed with scope.    Currently off both Xarelto and Plavix. Ideally needs to be on Plavix through August 2018.             Signed by: Queen Slough, NP    [facility]  NP Spectralink [phone] (8am-5pm)  MD Spectralink [phone] or [phone] (8am-5pm)  Arrhythmia Spectralink [phone] (8am-4:30pm)  After hours, non urgent consult line [phone]  After Hours, urgent consults [phone]

## 2016-12-05 NOTE — Plan of Care (Signed)
Problem: Safety  Goal: Patient will be free from injury during hospitalization  Outcome: Progressing   12/05/16 0329   Goal/Interventions addressed this shift   Patient will be free from injury during hospitalization  Assess patient's risk for falls and implement fall prevention plan of care per policy;Provide and maintain safe environment;Hourly rounding     Goal: Patient will be free from infection during hospitalization  Outcome: Progressing   12/05/16 0329   Goal/Interventions addressed this shift   Free from Infection during hospitalization Assess and monitor for signs and symptoms of infection;Monitor lab/diagnostic results       Problem: Pain  Goal: Pain at adequate level as identified by patient  Outcome: Progressing   12/05/16 0329   Goal/Interventions addressed this shift   Pain at adequate level as identified by patient Identify patient comfort function goal;Evaluate if patient comfort function goal is met;Evaluate patient's satisfaction with pain management progress       Comments: Pt. Aox4, VSS, received from ED 2200. 1 PRBC Transfusion done @0230 . No transfusion reaction occurred.  No BM last night, no nausea and vomiting. Report general weakness and fatigue. Use cane when walking.Will check H/H @0400   Will continue to monitor.

## 2016-12-05 NOTE — Progress Notes (Signed)
Medicine Progress Note    Date Time: 12/05/16 9:20 AM  Patient Name: [name],[name]      Assessment & Plan    Assessment:   # GIB with both melena and blood clots-in setting plavix (xarelto on hold since 5/23)  # Acute blood loss anemia, symptomatic-s/p 1u prbc  # Thrombocytopenia  # SOB 2/2 to anemia vs CHF  # S/p recent ASD closure 11/11/16  # Nonischemic CM EF 25%--dry weight ~135lbs  -LHC 10/17 w/o sig CAD  # Afib s/p ablation  # Hx of cardiac arrest 12/17 s/p ICD  # HLD  # Hypothyroid  # Severe DLCO-followed by pulm [place]  # Kyphoscoliosis with moderate restriction  # Celiac disease    Plan:   -close tele monitoring  -serial h/h monitoring  -f/u GI and cards recs  -transfuse addl 1u prbc today w dose lasix 40mg  iv x 1  -daily weights, strict is/os  -resume low dose coreg   -hold any antiplatelets/anticoagulation for now  -started on clears diet by GI  -PPI IV bid  -incr oob/activity as tolerated    Foley: none  DVT Prophylaxis: scda lone d/t gib    Disposition:   Today's date: 12/05/2016   Clinical Milestones: stable h/h, gi clearance  Anticipated discharge needs: tbd    Signed by: Quintavius Niebuhr Ashiq Keiley Levey, MD            CC: GIB    Subjective:   Pt denies any BM or further obvious GIB. She states hungry from being npo but no abd pain, n/v. No LH/weakness. No CP/palps. States extertional dyspnea but not at rest or lying flat.      Medications     Current Facility-Administered Medications   Medication Dose Route Frequency   . [START ON 12/06/2016] fluticasone furoate  1 puff Inhalation QAM   . gabapentin  100 mg Oral Q8H SCH   . levothyroxine  112 mcg Oral Daily at 0600   . pantoprazole  40 mg Intravenous BID   . simvastatin  10 mg Oral QHS          Physical Exam   Patient Vitals for the past 24 hrs:   BP Temp Temp src Pulse Resp SpO2 Height Weight   12/05/16 0835 - - - 82 17 92 % - -   12/05/16 0748 116/67 (!) 96.9 F (36.1 C) Oral 80 17 96 % - -   12/05/16 0221 122/72 98.2 F (36.8 C) Oral 83 - 94 % - -    12/05/16 0125 112/73 - - 83 - 95 % - -   12/05/16 0016 116/77 97.5 F (36.4 C) Oral 79 16 97 % - -   12/04/16 2313 115/70 98.1 F (36.7 C) Oral 83 - 96 % - -   12/04/16 2249 - 98 F (36.7 C) Oral 77 16 96 % - -   12/04/16 2230 115/70 98 F (36.7 C) Oral 77 16 96 % - -   12/04/16 2124 121/78 97.9 F (36.6 C) Oral 79 15 99 % - -   12/04/16 2030 113/62 - - 82 20 98 % - -   12/04/16 2000 120/63 - - 81 18 98 % - -   12/04/16 1900 114/61 - - 90 18 98 % - -   12/04/16 1828 120/78 97.6 F (36.4 C) Temporal Art 86 20 98 % 1.626 m (5\' 4" ) 61.2 kg (135 lb)   12/04/16 1819 - - - 86 18 96 % - -  Body mass index is 23.17 kg/m.  Intake and Output Summary (Last 24 hours) at Date Time    Intake/Output Summary (Last 24 hours) at 12/05/16 0920  Last data filed at 12/05/16 0215   Gross per 24 hour   Intake              300 ml   Output                0 ml   Net              300 ml       General: awake, alert, oriented x 3; no acute distress.  Cardiovascular: regular rate and rhythm, no murmurs/rubs/gallops  Lungs: clear to auscultation bilaterally anteriorly, without wheezing/rales/rhonchi  Abdomen: soft, non-tender, non-distended; normoactive bowel sounds, no rebound or guarding  Extremities: no clubbing/cyanosis/edema    Labs & Imaging     Results     Procedure Component Value Units Date/Time    CBC without differential [[id number]]  (Abnormal) Collected:  12/05/16 0410    Specimen:  Blood from Blood Updated:  12/05/16 0513     WBC 2.71 (L) x10 3/uL      Hgb 7.2 (L) g/dL      Hematocrit 19.1 (L) %      Platelets 121 (L) x10 3/uL      RBC 2.32 (L) x10 6/uL      MCV 96.6 fL      MCH 31.0 pg      MCHC 32.1 g/dL      RDW 16 (H) %      MPV 9.8 fL      Nucleated RBC 0.0 /100 WBC      Absolute NRBC 0.00 x10 3/uL     Narrative:       Start after prbc given    Basic Metabolic Panel [[id number]] Collected:  12/05/16 0411    Specimen:  Blood Updated:  12/05/16 0513     Glucose 88 mg/dL      BUN 30.8 mg/dL      Creatinine 0.8 mg/dL       Calcium 8.5 mg/dL      Sodium 657 mEq/L      Potassium 3.7 mEq/L      Chloride 109 mEq/L      CO2 21 mEq/L     Narrative:       Start after prbc given    Hemolysis index [[id number]] Collected:  12/05/16 0411     Updated:  12/05/16 0513     Hemolysis Index 8    Narrative:       Start after prbc given    GFR [[id number]] Collected:  12/05/16 0411     Updated:  12/05/16 0513     EGFR >60.0    Narrative:       Start after prbc given    UA, Reflex to Microscopic (pts  3 + yrs) [[id number]]  (Abnormal) Collected:  12/05/16 0413    Specimen:  Urine Updated:  12/05/16 0453     Urine Type Clean Catch     Color, UA Straw     Clarity, UA Clear     Specific Gravity UA 1.010     Urine pH 6.0     Leukocyte Esterase, UA Trace (A)     Nitrite, UA Negative     Protein, UR Negative     Glucose, UA Negative     Ketones UA Negative     Urobilinogen, UA Normal mg/dL  Bilirubin, UA Negative     Blood, UA Negative     RBC, UA 0 - 2 /hpf      WBC, UA 0 - 5 /hpf      Squamous Epithelial Cells, Urine 0 - 5 /hpf     Prepare RBC: one unit [[id number]] Collected:  12/04/16 1850     Updated:  12/05/16 0042     RBC Leukoreduced RBC Leukoreduced     BLUNIT [id number]     Status transfused     PRODUCT CODE (NON READABLE) [id number]     Expiration Date [id number]     UTYPE A POS    Type and Screen [[id number]] Collected:  12/04/16 1850    Specimen:  Blood Updated:  12/04/16 1937     ABO Rh A POS     AB Screen Gel NEG    Comprehensive metabolic panel [[id number]]  (Abnormal) Collected:  12/04/16 1850    Specimen:  Blood Updated:  12/04/16 1918     Glucose 102 (H) mg/dL      BUN 72.5 (H) mg/dL      Creatinine 0.9 mg/dL      Sodium 366 mEq/L      Potassium 4.4 mEq/L      Chloride 105 mEq/L      CO2 22 mEq/L      Calcium 9.0 mg/dL      Protein, Total 6.1 g/dL      Albumin 3.7 g/dL      AST (SGOT) 24 U/L      ALT 12 U/L      Alkaline Phosphatase 113 (H) U/L      Bilirubin, Total 0.6 mg/dL      Globulin 2.4 g/dL      Albumin/Globulin Ratio 1.5    GFR  [[id number]] Collected:  12/04/16 1850     Updated:  12/04/16 1918     EGFR 59.8    PT/APTT [[id number]] Collected:  12/04/16 1850     Updated:  12/04/16 1915     PT 13.5 sec      PT INR 1.0     PT Anticoag. Given Within 48 hrs. None     PTT 25 sec     CBC with differential [[id number]]  (Abnormal) Collected:  12/04/16 1850    Specimen:  Blood from Blood Updated:  12/04/16 1907     WBC 4.38 x10 3/uL      Hgb 7.4 (L) g/dL      Hematocrit 16.6 (L) %      Platelets 152 x10 3/uL      RBC 2.35 (L) x10 6/uL      MCV 98.3 fL      MCH 31.5 pg      MCHC 32.0 g/dL      RDW 16 (H) %      MPV 9.9 fL      Neutrophils 58.2 %      Lymphocytes Automated 30.6 %      Monocytes 7.8 %      Eosinophils Automated 2.7 %      Basophils Automated 0.2 %      Immature Granulocyte 0.5 %      Nucleated RBC 0.0 /100 WBC      Neutrophils Absolute 2.55 x10 3/uL      Abs Lymph Automated 1.34 x10 3/uL      Abs Mono Automated 0.34 x10 3/uL      Abs Eos Automated 0.12 x10 3/uL      Absolute Baso  Automated 0.01 x10 3/uL      Absolute Immature Granulocyte 0.02 x10 3/uL      Absolute NRBC 0.00 x10 3/uL           Imaging personally reviewed, including:  Radiology Results (24 Hour)     Procedure Component Value Units Date/Time    XR Chest  AP Portable [[id number]] Collected:  12/04/16 1956    Order Status:  Completed Updated:  12/04/16 2007    Narrative:       CLINICAL HISTORY: GI bleeding    COMPARISON: 05/25/2016    FINDINGS: Single AP portable view of the chest was exposed. The heart is  enlarged. There are pacing wires overlying the right atrium and right  ventricle. The aorta is unfolded consistent with atherosclerotic change.  No pneumothorax is identified. The lungs are hyperinflated. There is no  obvious congestive heart failure or consolidation. No pleural effusion  is identified. No mediastinal abnormalities are identified. There are  degenerative arthritic changes noted in the spine with scoliosis.      Impression:        Cardiomegaly, and pacing  wires as described. No obvious  congestive heart failure or focal airspace consolidation.    Colonel Bald, MD   12/04/2016 8:03 PM               Signed by: Izel Hochberg Maretta Bees, MD

## 2016-12-06 LAB — CBC
Absolute NRBC: 0 10*3/uL
Hematocrit: 28.1 % — ABNORMAL LOW (ref 37.0–47.0)
Hgb: 9.2 g/dL — ABNORMAL LOW (ref 12.0–16.0)
MCH: 31.1 pg (ref 28.0–32.0)
MCHC: 32.7 g/dL (ref 32.0–36.0)
MCV: 94.9 fL (ref 80.0–100.0)
MPV: 10 fL (ref 9.4–12.3)
Nucleated RBC: 0 /100 WBC (ref 0.0–1.0)
Platelets: 138 10*3/uL — ABNORMAL LOW (ref 140–400)
RBC: 2.96 10*6/uL — ABNORMAL LOW (ref 4.20–5.40)
RDW: 17 % — ABNORMAL HIGH (ref 12–15)
WBC: 4.09 10*3/uL (ref 3.50–10.80)

## 2016-12-06 LAB — BASIC METABOLIC PANEL
BUN: 15 mg/dL (ref 7.0–19.0)
CO2: 22 mEq/L (ref 21–29)
Calcium: 8.4 mg/dL (ref 7.9–10.2)
Chloride: 109 mEq/L (ref 100–111)
Creatinine: 0.8 mg/dL (ref 0.4–1.5)
Glucose: 94 mg/dL (ref 70–100)
Potassium: 3.6 mEq/L (ref 3.5–5.1)
Sodium: 142 mEq/L (ref 136–145)

## 2016-12-06 LAB — MAGNESIUM: Magnesium: 2.2 mg/dL (ref 1.6–2.6)

## 2016-12-06 LAB — HEMOLYSIS INDEX: Hemolysis Index: 29 — ABNORMAL HIGH (ref 0–18)

## 2016-12-06 LAB — GFR: EGFR: >60

## 2016-12-06 MED ORDER — POTASSIUM CHLORIDE CRYS ER 20 MEQ PO TBCR
40.0000 meq | EXTENDED_RELEASE_TABLET | Freq: Once | ORAL | Status: AC
Start: 2016-12-06 — End: 2016-12-06
  Administered 2016-12-06: 13:00:00 40 meq via ORAL
  Filled 2016-12-06: qty 2

## 2016-12-06 MED ORDER — FUROSEMIDE 10 MG/ML IJ SOLN
20.0000 mg | Freq: Once | INTRAMUSCULAR | Status: AC
Start: 2016-12-06 — End: 2016-12-06
  Administered 2016-12-06: 12:00:00 20 mg via INTRAVENOUS
  Filled 2016-12-06: qty 4

## 2016-12-06 MED ORDER — FUROSEMIDE 10 MG/ML IJ SOLN
20.0000 mg | Freq: Every day | INTRAMUSCULAR | Status: AC
Start: 2016-12-07 — End: 2016-12-08
  Administered 2016-12-07 – 2016-12-08 (×2): 20 mg via INTRAVENOUS
  Filled 2016-12-06 (×2): qty 4

## 2016-12-06 NOTE — Progress Notes (Addendum)
[facility] PROGRESS NOTE  [facility]      Date Time: 12/06/16 9:05 AM  Patient Name: [name], [name]  Medical Record #:  [id number]  Account#:  [id number]  Admission Date:  12/04/2016         Patient Active Problem List   Diagnosis   . Celiac disease   . Scoliosis   . Atrial fibrillation   . Mitral valve disorder   . Disease of tricuspid valve   . Chronic airway obstruction, not elsewhere classified   . AF (atrial fibrillation)   . Paroxysmal a-fib   . CHF (congestive heart failure)   . Ischemic embolic stroke   . Decreased diffusion capacity   . PFO (patent foramen ovale)   . Acquired hypothyroidism   . Chronic systolic heart failure   . PAH (pulmonary artery hypertension)   . Non-rheumatic mitral regurgitation   . Respiratory failure   . Ischemic stroke   . Altered mental status, unspecified altered mental status type   . Closed pertrochanteric fracture of femur, left, initial encounter   . Torsades de pointes   . ASD (atrial septal defect)   . Gastrointestinal hemorrhage, unspecified gastrointestinal hemorrhage type       Subjective:   Denies chest pain or palpitations.    Assessment:    Presented with rectal bleeding and bloody stools x 1 week. Hgb 7.4   S/p ASD closure 11/11/2016   Chronic systolic CHF   Nonischemic cardiomyopathy with ejection fraction 25% November 2017, up to 45% October 06, 2016.   Recurrent atrial fibrillation noted on last visit Oct 27, 2016. Status post failure of amiodarone and extensive left and right atrial focus impulsive rotor modulation ablation with pulmonary vein isolation September 2017   Chronic Xarelto for anticoagulation   Out of hospital cardiac arrest December 2017, on Tikosyn and also with progressive decline in left ventricular function, treated with implantable cardioverter defibrillator implant December 2017.   Left heart cath 03/2016 revealed no significant CAD   Kyphoscoliosis with moderate restriction.   Hypothyroidism   Severe DLCO abnormality  followed by Dr. Stacy Gardner.    Hypothyroidism, on Synthroid.    Recommendations:    Daily weights and I/Os. She generally requires both furosemide and metolazone to maintain euvolemia. Will need to monitor fluid balance closely. Start Lasix 20mg  IV daily. Should she need additional transfusion would recommend additional Lasix after each unit.    Continue carvedilol 3.125mg  BID   GI following with plans for EGD/colonoscopy early next week.    Currently off both Xarelto and Plavix. Ideally needs to be on Plavix through August 2018.   Seen and examined. Agree with the above note. Continue IV lasix.   Signed by Lynford Humphrey MD    Medications:      Scheduled Meds:      carvedilol 3.125 mg Oral Q12H SCH   fluticasone furoate 1 puff Inhalation QAM   gabapentin 100 mg Oral Q8H SCH   levothyroxine 112 mcg Oral Daily at 0600   pantoprazole 40 mg Intravenous BID   simvastatin 10 mg Oral QHS       Continuous Infusions:           Physical Exam:       VITAL SIGNS PHYSICAL EXAM   Vitals:    12/06/16 0807   BP: 117/73   Pulse: 80   Resp: 20   Temp: 97.3 F (36.3 C)   SpO2: 95%     Temp (24hrs), Avg:97.4 F (36.3 C),  Min:96 F (35.6 C), Max:98.8 F (37.1 C)        Intake/Output Summary (Last 24 hours) at 12/06/16 0905  Last data filed at 12/06/16 0842   Gross per 24 hour   Intake           782.33 ml   Output             1600 ml   Net          -817.67 ml    Physical Exam  General: awake, alert, breathing comfortably, no acute distress  Head: normocephalic  Eyes: EOM's intact  Cardiovascular: irregularly irregular rate and rhythm, no murmurs, rubs or gallops  Neck: no carotid bruits or JVD  Lungs: clear to auscultation bilateraly, without wheezing, rhonchi, or rales  Abdomen: soft, non-tender, non-distended; no palpable masses,  normoactive bowel sounds  Extremities: no edema  Pulse: equal pulses, 4/4 symmetric  Neurological: Alert and oriented X3, mood and affect normal  Musculoskeletal: normal strength and tone          Labs:                 Recent Labs  Lab 12/04/16  1850   Bilirubin, Total 0.6   Protein, Total 6.1   Albumin 3.7   ALT 12   AST (SGOT) 24       Recent Labs  Lab 12/06/16  0448   Magnesium 2.2       Recent Labs  Lab 12/04/16  1850   PT 13.5   PT INR 1.0   PTT 25       Recent Labs  Lab 12/06/16  0448 12/05/16  2049 12/05/16  0410   WBC 4.09 4.78 2.71*   Hgb 9.2* 9.5* 7.2*   Hematocrit 28.1* 29.3* 22.4*   Platelets 138* 138* 121*       Recent Labs  Lab 12/06/16  0448 12/05/16  0411 12/04/16  1850   Sodium 142 139 138   Potassium 3.6 3.7 4.4   Chloride 109 109 105   CO2 22 21 22    BUN 15.0 18.0 23.0*   Creatinine 0.8 0.8 0.9   EGFR >60.0 >60.0 59.8   Glucose 94 88 102*   Calcium 8.4 8.5 9.0           Invalid input(s): FREET4    .No results found for: BNP     Weight Monitoring 08/28/2016 11/10/2016 11/11/2016 12/03/2016 12/04/2016 12/06/2016 12/06/2016   Height - 162.6 cm 162.6 cm - 162.6 cm - -   Height Method - - Estimated - Stated - -   Weight 63.504 kg 61.236 kg 61.236 kg 61.236 kg 61.236 kg 59.966 kg 60.192 kg   Weight Method - - Standing Scale - Stated Standing Scale Standing Scale   BMI (calculated) - 23.2 kg/m2 23.2 kg/m2 - 23.2 kg/m2 - -       Imaging:       ____________________________________________    Signed by: Queen Slough, NP      [facility]  NP Spectralink [phone] (8am-5pm)  MD Spectralink [phone] or [phone] (8am-5pm)  Arrhythmia Spectralink [phone] (8am-4:30pm)  After hours, non urgent consult line [phone]  After Hours, urgent consults [phone]

## 2016-12-06 NOTE — Plan of Care (Signed)
Problem: Moderate/High Fall Risk Score >5  Goal: Patient will remain free of falls  Outcome: Progressing      Problem: Safety  Goal: Patient will be free from injury during hospitalization  Outcome: Progressing   12/06/16 0323   Goal/Interventions addressed this shift   Patient will be free from injury during hospitalization  Use appropriate transfer methods;Provide and maintain safe environment       Problem: Pain  Goal: Pain at adequate level as identified by patient  Outcome: Progressing   12/06/16 0323   Goal/Interventions addressed this shift   Pain at adequate level as identified by patient Identify patient comfort function goal;Offer non-pharmacological pain management interventions       Comments: Neuro: x4    Resp: Room air     Tele: V paced     VS: VSS    Assessment: Pt stable overnight. Received unit of blood yesterday evening with dose of lasix afterwards, tolerated well. Clear liquid diet started at [facility]. Put updated on POC, all questions answered.    Plan: Pt will have EGD/colonoscopy Mon or Tuesday.     Complaints/Needs: No complaints of pain or discomfort

## 2016-12-06 NOTE — Progress Notes (Signed)
Medicine Progress Note    Date Time: 12/06/16 9:17 AM  Patient Name: [name]      Assessment & Plan    Assessment:   # GIB with both melena and blood clots-in setting plavix (xarelto on hold since 5/23)  # Acute blood loss anemia, symptomatic-s/p 2u prbc total  # Thrombocytopenia  # SOB 2/2 to anemia vs CHF  # S/p recent ASD closure 11/11/16  # Nonischemic CM EF 25%--dry weight ~135lbs  -LHC 10/17 w/o sig CAD  # Afib s/p ablation  # Hx of cardiac arrest 12/17 s/p ICD  # HLD  # Hypothyroid  # Severe DLCO-followed by pulm [place]  # Kyphoscoliosis with moderate restriction  # Celiac disease    Plan:   -tele; keep k>4 and mg>2  -serial h/h monitoring  -f/u GI and cards recs  -lasix 20mg  iv daily  -monitor bmp  -daily weights, strict is/os  -hold any antiplatelets/anticoagulation for now--plavix will need to be held x 5 days prior to endoscopic procedures and tentative plan for EGD/Colonoscopy on Tues vs Wednesday   -PPI IV bid  -incr oob/activity as tolerated    Foley: none  DVT Prophylaxis: scd alone d/t gib    Disposition:   Today's date: 12/06/2016   Clinical Milestones: stable h/h, gi clearance  Anticipated discharge needs: tbd    Signed by: Jalesa Thien Ashiq Scotti Kosta, MD            CC: GIB    Subjective:   Pt had brown BM last night. Tol po well w/o abd pain, n/v. No LH/weakness. No CP/palps/sob, feels overall stronger today.      Medications     Current Facility-Administered Medications   Medication Dose Route Frequency   . carvedilol  3.125 mg Oral Q12H SCH   . fluticasone furoate  1 puff Inhalation QAM   . gabapentin  100 mg Oral Q8H SCH   . levothyroxine  112 mcg Oral Daily at 0600   . pantoprazole  40 mg Intravenous BID   . simvastatin  10 mg Oral QHS          Physical Exam     Patient Vitals for the past 24 hrs:   BP Temp Temp src Pulse Resp SpO2 Weight   12/06/16 0807 117/73 97.3 F (36.3 C) - 80 20 95 % -   12/06/16 0729 - - - - - - 60.2 kg (132 lb 11.2 oz)   12/06/16 0552 116/61 (!) 96.3 F (35.7 C)  Oral 80 14 95 % -   12/06/16 0550 - - - - - - 60 kg (132 lb 3.2 oz)   12/06/16 0023 115/61 (!) 96 F (35.6 C) Oral 80 14 96 % -   12/05/16 2053 116/71 - - 84 - - -   12/05/16 1904 114/63 - - - - - -   12/05/16 1825 109/69 97.7 F (36.5 C) Oral - 18 94 % -   12/05/16 1654 136/63 97 F (36.1 C) Oral 99 15 96 % -   12/05/16 1522 108/55 - - 87 - 96 % -   12/05/16 1423 116/72 97.9 F (36.6 C) Oral - 18 98 % -   12/05/16 1408 - - - 80 18 - -   12/05/16 1402 113/65 98.1 F (36.7 C) Oral - - 98 % -   12/05/16 1246 111/64 98.8 F (37.1 C) Oral 80 18 96 % -     Body mass index is 22.78 kg/m.  Intake  and Output Summary (Last 24 hours) at Date Time    Intake/Output Summary (Last 24 hours) at 12/06/16 0917  Last data filed at 12/06/16 0842   Gross per 24 hour   Intake           782.33 ml   Output             1600 ml   Net          -817.67 ml       General: awake, alert, oriented x 3; no acute distress.  Cardiovascular: regular rate and rhythm, no murmurs/rubs/gallops  Lungs: clear to auscultation bilaterally anteriorly, without wheezing/rales/rhonchi  Abdomen: soft, non-tender, non-distended; normoactive bowel sounds, no rebound or guarding  Extremities: no clubbing/cyanosis/edema    Labs & Imaging     Results     Procedure Component Value Units Date/Time    Basic Metabolic Panel [[id number]] Collected:  12/06/16 0448    Specimen:  Blood Updated:  12/06/16 0626     Glucose 94 mg/dL      BUN 23.7 mg/dL      Creatinine 0.8 mg/dL      Calcium 8.4 mg/dL      Sodium 628 mEq/L      Potassium 3.6 mEq/L      Chloride 109 mEq/L      CO2 22 mEq/L     Narrative:       Please call for hgb <7.0    Magnesium [[id number]] Collected:  12/06/16 0448    Specimen:  Blood Updated:  12/06/16 0626     Magnesium 2.2 mg/dL     Narrative:       Please call for hgb <7.0    Hemolysis index [[id number]]  (Abnormal) Collected:  12/06/16 0448     Updated:  12/06/16 0626     Hemolysis Index 29 (H)    Narrative:       Please call for hgb <7.0    GFR  [[id number]] Collected:  12/06/16 0448     Updated:  12/06/16 0626     EGFR >60.0    Narrative:       Please call for hgb <7.0    CBC without differential [[id number]]  (Abnormal) Collected:  12/06/16 0448    Specimen:  Blood from Blood Updated:  12/06/16 0617     WBC 4.09 x10 3/uL      Hgb 9.2 (L) g/dL      Hematocrit 99.3 (L) %      Platelets 138 (L) x10 3/uL      RBC 2.96 (L) x10 6/uL      MCV 94.9 fL      MCH 31.1 pg      MCHC 32.7 g/dL      RDW 17 (H) %      MPV 10.0 fL      Nucleated RBC 0.0 /100 WBC      Absolute NRBC 0.00 x10 3/uL     Narrative:       Please call for hgb <7.0    Prepare RBC: one unit RBC [[id number]] Collected:  12/04/16 1850     Updated:  12/06/16 0042     RBC Leukoreduced RBC Leukoreduced     BLUNIT [id number]     Status transfused     PRODUCT CODE (NON READABLE) [id number]     Expiration Date [id number]     UTYPE A POS    CBC without differential [[id number]]  (Abnormal) Collected:  12/05/16 2049    Specimen:  Blood from Blood Updated:  12/05/16 2206     WBC 4.78 x10 3/uL      Hgb 9.5 (L) g/dL      Hematocrit 98.1 (L) %      Platelets 138 (L) x10 3/uL      RBC 3.09 (L) x10 6/uL      MCV 94.8 fL      MCH 30.7 pg      MCHC 32.4 g/dL      RDW 17 (H) %      MPV 9.9 fL      Nucleated RBC 0.4 /100 WBC      Absolute NRBC 0.02 (H) x10 3/uL     Narrative:       Please call for hgb <7.0    Magnesium [[id number]] Collected:  12/05/16 0411     Updated:  12/05/16 1323     Magnesium 2.1 mg/dL     Narrative:       Start after prbc given          Imaging personally reviewed, including:  Radiology Results (24 Hour)     ** No results found for the last 24 hours. **               Signed by: Salley Boxley Maretta Bees, MD

## 2016-12-06 NOTE — Plan of Care (Addendum)
Notes:  Weighed pt on standing scale.  Called GI re: NPO status.  Pt is allowed to eat, reordered mechanical soft diet.         Problem: Moderate/High Fall Risk Score >5  Goal: Patient will remain free of falls  Outcome: Progressing   12/04/16 1832   OTHER   Moderate Risk (6-13) MOD-Consider a move closer to Nurses Station;MOD-Use of assistive devices-bedside commode if appropriate;MOD-Utilize diversion activities;MOD-Include family in multidisciplinary POC discussions;MOD-Remain with patient during toileting       Problem: Safety  Goal: Patient will be free from injury during hospitalization  Outcome: Progressing   12/06/16 0323   Goal/Interventions addressed this shift   Patient will be free from injury during hospitalization  Use appropriate transfer methods;Provide and maintain safe environment       Problem: Pain  Goal: Pain at adequate level as identified by patient  Outcome: Progressing   12/06/16 0323   Goal/Interventions addressed this shift   Pain at adequate level as identified by patient Identify patient comfort function goal;Offer non-pharmacological pain management interventions

## 2016-12-07 LAB — CBC
Absolute NRBC: 0 10*3/uL
Hematocrit: 29.2 % — ABNORMAL LOW (ref 37.0–47.0)
Hgb: 9.2 g/dL — ABNORMAL LOW (ref 12.0–16.0)
MCH: 30.2 pg (ref 28.0–32.0)
MCHC: 31.5 g/dL — ABNORMAL LOW (ref 32.0–36.0)
MCV: 95.7 fL (ref 80.0–100.0)
MPV: 9.9 fL (ref 9.4–12.3)
Nucleated RBC: 0 /100 WBC (ref 0.0–1.0)
Platelets: 128 10*3/uL — ABNORMAL LOW (ref 140–400)
RBC: 3.05 10*6/uL — ABNORMAL LOW (ref 4.20–5.40)
RDW: 16 % — ABNORMAL HIGH (ref 12–15)
WBC: 3.97 10*3/uL (ref 3.50–10.80)

## 2016-12-07 LAB — ECG 12-LEAD
Atrial Rate: 72 {beats}/min
Q-T Interval: 436 ms
QRS Duration: 178 ms
QTC Calculation (Bezet): 509 ms
R Axis: -71 degrees
T Axis: 88 degrees
Ventricular Rate: 82 {beats}/min

## 2016-12-07 LAB — BASIC METABOLIC PANEL
BUN: 20 mg/dL — ABNORMAL HIGH (ref 7.0–19.0)
CO2: 22 mEq/L (ref 21–29)
Calcium: 8.4 mg/dL (ref 7.9–10.2)
Chloride: 108 mEq/L (ref 100–111)
Creatinine: 0.9 mg/dL (ref 0.4–1.5)
Glucose: 78 mg/dL (ref 70–100)
Potassium: 3.8 mEq/L (ref 3.5–5.1)
Sodium: 139 mEq/L (ref 136–145)

## 2016-12-07 LAB — GFR: EGFR: 59.8

## 2016-12-07 LAB — MAGNESIUM: Magnesium: 2.1 mg/dL (ref 1.6–2.6)

## 2016-12-07 LAB — HEMOLYSIS INDEX: Hemolysis Index: 19 — ABNORMAL HIGH (ref 0–18)

## 2016-12-07 MED ORDER — POTASSIUM CHLORIDE CRYS ER 20 MEQ PO TBCR
20.0000 meq | EXTENDED_RELEASE_TABLET | Freq: Once | ORAL | Status: AC
Start: 2016-12-07 — End: 2016-12-07
  Administered 2016-12-07: 09:00:00 20 meq via ORAL
  Filled 2016-12-07: qty 1

## 2016-12-07 NOTE — Progress Notes (Addendum)
Medicine Progress Note    Date Time: 12/07/16 8:08 AM  Patient Name: [name],[name]      Assessment & Plan    Assessment:   # GIB with both melena and blood clots-in setting plavix (xarelto on hold since 5/23)  # Acute blood loss anemia, symptomatic-s/p 2u prbc total--now stabilized  # Thrombocytopenia-stable overall  # S/p recent ASD closure 11/11/16  # Nonischemic CM EF 25%--dry weight ~135lbs  -LHC 10/17 w/o sig CAD  # Afib s/p ablation  # Hx of cardiac arrest 12/17 s/p ICD  # HLD  # Hypothyroid  # Severe DLCO-followed by pulm [place]  # Kyphoscoliosis with moderate restriction  # Celiac disease    Plan:   -tele; keep k>4 and mg>2  -serial h/h monitoring  -f/u GI and cards recs  -lasix 20mg  iv daily  -monitor bmp  -daily weights, strict is/os  -hold any antiplatelets/anticoagulation for now--plavix will need to be held x 5 days prior to endoscopic procedures and plan for CLD tomorrow and then EGD/Colonoscopy on Wednesday   -PPI IV bid  -incr oob/activity as tolerated    Foley: none  DVT Prophylaxis: scd alone d/t gib    Disposition:   Today's date: 12/07/2016   Clinical Milestones: stable h/h, gi clearance  Anticipated discharge needs: tbd    Signed by: Azaiah Licciardi Ashiq Valmai [place], MD            CC: GIB    Subjective:   No further blood in stool noted.Tol po well w/o abd pain, n/v. No LH/weakness. No CP/palps/sob. Feels overall stronger.      Medications     Current Facility-Administered Medications   Medication Dose Route Frequency   . carvedilol  3.125 mg Oral Q12H SCH   . fluticasone furoate  1 puff Inhalation QAM   . furosemide  20 mg Intravenous Daily   . gabapentin  100 mg Oral Q8H SCH   . levothyroxine  112 mcg Oral Daily at 0600   . pantoprazole  40 mg Intravenous BID   . simvastatin  10 mg Oral QHS          Physical Exam     Patient Vitals for the past 24 hrs:   BP Temp Temp src Pulse Resp SpO2   12/07/16 0200 130/69 98 F (36.7 C) Oral 79 14 95 %   12/06/16 2015 122/69 97.9 F (36.6 C) Oral 85 16 96 %      Body mass index is 22.78 kg/m.  Intake and Output Summary (Last 24 hours) at Date Time    Intake/Output Summary (Last 24 hours) at 12/07/16 0808  Last data filed at 12/07/16 0543   Gross per 24 hour   Intake              586 ml   Output             2350 ml   Net            -1764 ml       General: awake, alert, oriented x 3; no acute distress.  Cardiovascular: regular rate and rhythm, no murmurs/rubs/gallops  Lungs: clear to auscultation bilaterally anteriorly, without wheezing/rales/rhonchi  Abdomen: soft, non-tender, non-distended; normoactive bowel sounds, no rebound or guarding  Extremities: no clubbing/cyanosis/edema    Labs & Imaging     Results     Procedure Component Value Units Date/Time    CBC without differential [[id number]]  (Abnormal) Collected:  12/07/16 0428    Specimen:  Blood from Blood Updated:  12/07/16 0552     WBC 3.97 x10 3/uL      Hgb 9.2 (L) g/dL      Hematocrit 91.4 (L) %      Platelets 128 (L) x10 3/uL      RBC 3.05 (L) x10 6/uL      MCV 95.7 fL      MCH 30.2 pg      MCHC 31.5 (L) g/dL      RDW 16 (H) %      MPV 9.9 fL      Nucleated RBC 0.0 /100 WBC      Absolute NRBC 0.00 x10 3/uL     Narrative:       Please call for hgb <7.0    Magnesium [[id number]] Collected:  12/07/16 0428    Specimen:  Blood Updated:  12/07/16 0549     Magnesium 2.1 mg/dL     Narrative:       Please call for hgb <7.0    Hemolysis index [[id number]]  (Abnormal) Collected:  12/07/16 0428     Updated:  12/07/16 0549     Hemolysis Index 19 (H)    Narrative:       Please call for hgb <7.0    GFR [[id number]] Collected:  12/07/16 0428     Updated:  12/07/16 0549     EGFR 59.8    Narrative:       Please call for hgb <7.0    Basic Metabolic Panel [[id number]]  (Abnormal) Collected:  12/07/16 0428    Specimen:  Blood Updated:  12/07/16 0549     Glucose 78 mg/dL      BUN 53.6 (H) mg/dL      Creatinine 0.9 mg/dL      Calcium 8.4 mg/dL      Sodium 644 mEq/L      Potassium 3.8 mEq/L      Chloride 108 mEq/L      CO2 22 mEq/L      Narrative:       Please call for hgb <7.0          Imaging personally reviewed, including:  Radiology Results (24 Hour)     ** No results found for the last 24 hours. **               Signed by: Bobetta Korf Maretta Bees, MD

## 2016-12-07 NOTE — Plan of Care (Signed)
Discussed case with Dr. Nedra Hai, GI attending. Recommends holding  plavix for full 5 days prior to endoscopic procedures (last dose 6/15). Planning for EGD/Colonoscopy on Wednesday 6/20. Clear liquid diet tomorrow 6/19.

## 2016-12-07 NOTE — Plan of Care (Signed)
Problem: Moderate/High Fall Risk Score >5  Goal: Patient will remain free of falls  Outcome: Progressing   12/04/16 1832   OTHER   Moderate Risk (6-13) MOD-Consider a move closer to Nurses Station;MOD-Use of assistive devices-bedside commode if appropriate;MOD-Utilize diversion activities;MOD-Include family in multidisciplinary POC discussions;MOD-Remain with patient during toileting       Problem: Safety  Goal: Patient will be free from injury during hospitalization  Outcome: Progressing   12/07/16 0128   Goal/Interventions addressed this shift   Patient will be free from injury during hospitalization  Assess patient's risk for falls and implement fall prevention plan of care per policy;Provide and maintain safe environment;Use appropriate transfer methods;Hourly rounding;Ensure appropriate safety devices are available at the bedside       Problem: Pain  Goal: Pain at adequate level as identified by patient  Outcome: Progressing   12/07/16 0128   Goal/Interventions addressed this shift   Pain at adequate level as identified by patient Identify patient comfort function goal;Assess for risk of opioid induced respiratory depression, including snoring/sleep apnea. Alert healthcare team of risk factors identified.;Assess pain on admission, during daily assessment and/or before any "as needed" intervention(s);Reassess pain within 30-60 minutes of any procedure/intervention, per Pain Assessment, Intervention, Reassessment (AIR) Cycle;Evaluate if patient comfort function goal is met;Offer non-pharmacological pain management interventions       Comments: Pt alert and oriented. Vs remains stable. Denied shortness of breath. NO BM during the shift. Denied abdominal pain. Pt comfortable and resting overnight. Floor mat  Applied. Call light in reach. Will check CBC next 530 AM. We will continue to monitor the pt.

## 2016-12-07 NOTE — Plan of Care (Signed)
Problem: Safety  Goal: Patient will be free from injury during hospitalization  Outcome: Progressing    Problem: Pain  Goal: Pain at adequate level as identified by patient  Outcome: Progressing

## 2016-12-07 NOTE — Progress Notes (Signed)
[place] HEART PROGRESS NOTE  [facility]      Date Time: 12/07/16 1:44 PM  Patient Name: [name], [name]  Medical Record #:  [id number]  Account#:  [id number]  Admission Date:  12/04/2016         Patient Active Problem List   Diagnosis   . Celiac disease   . Scoliosis   . Atrial fibrillation   . Mitral valve disorder   . Disease of tricuspid valve   . Chronic airway obstruction, not elsewhere classified   . AF (atrial fibrillation)   . Paroxysmal a-fib   . CHF (congestive heart failure)   . Ischemic embolic stroke   . Decreased diffusion capacity   . PFO (patent foramen ovale)   . Acquired hypothyroidism   . Chronic systolic heart failure   . PAH (pulmonary artery hypertension)   . Non-rheumatic mitral regurgitation   . Respiratory failure   . Ischemic stroke   . Altered mental status, unspecified altered mental status type   . Closed pertrochanteric fracture of femur, left, initial encounter   . Torsades de pointes   . ASD (atrial septal defect)   . Gastrointestinal hemorrhage, unspecified gastrointestinal hemorrhage type       Subjective:   Denies chest pain, or palpitations. SOB with exertion. No overt GI bleeding. Endoscopy postponed. GI wants 5 days off Plavix. \  Weight is stable.     Assessment:    Presented with rectal bleeding and bloody stools x 1 week. Hgb 9   S/p ASD closure 11/11/2016   Chronic systolic CHF   Nonischemic cardiomyopathy with ejection fraction 25% November 2017, up to 45% October 06, 2016.   Recurrent atrial fibrillation noted on last visit Oct 27, 2016. Status post failure of amiodarone and extensive left and right atrial focus impulsive rotor modulation ablation with pulmonary vein isolation September 2017   Chronic Xarelto for anticoagulation   Out of hospital cardiac arrest December 2017, on Tikosyn and also with progressive decline in left ventricular function, treated with implantable cardioverter defibrillator implant December 2017.   Left heart cath 03/2016 revealed  no significant CAD   Kyphoscoliosis with moderate restriction.   Hypothyroidism   Severe DLCO abnormality followed by Dr. Stacy Gardner.    Hypothyroidism, on Synthroid.      Recommendations:    Daily weights and I/Os. She generally requires both furosemide and metolazone to maintain euvolemia. Will need to monitor fluid balance closely. Continue lasix 20mg  IV daily. Should she need additional transfusion would recommend additional Lasix after each unit.    Continue carvedilol 3.125mg  BID   GI following with plans for EGD/colonoscopy This week.     Currently off both Xarelto and Plavix. Ideally needs to be on Plavix through August 2018.       Medications:      Scheduled Meds:      carvedilol 3.125 mg Oral Q12H SCH   fluticasone furoate 1 puff Inhalation QAM   furosemide 20 mg Intravenous Daily   gabapentin 100 mg Oral Q8H SCH   levothyroxine 112 mcg Oral Daily at 0600   pantoprazole 40 mg Intravenous BID   simvastatin 10 mg Oral QHS       Continuous Infusions:           Physical Exam:       VITAL SIGNS PHYSICAL EXAM   Vitals:    12/07/16 1005   BP: 103/61   Pulse: 89   Resp: 18   Temp: (!) 96.6 F (35.9 C)  SpO2: 94%     Temp (24hrs), Avg:97.3 F (36.3 C), Min:96.5 F (35.8 C), Max:98 F (36.7 C)      Telemetry: Reviewed no changes      Intake/Output Summary (Last 24 hours) at 12/07/16 1344  Last data filed at 12/07/16 1300   Gross per 24 hour   Intake              950 ml   Output             1750 ml   Net             -800 ml    Physical Exam  General: awake, alert, breathing comfortably, no acute distress  Head: normocephalic  Eyes: EOM's intact  Cardiovascular: regular rate and rhythm, normal S1, S2, no S3, no S4, no murmurs, rubs or gallops  Neck: no carotid bruits or JVD  Lungs: clear to auscultation bilateraly, without wheezing, rhonchi, or rales  Abdomen: soft, non-tender, non-distended; no palpable masses,  normoactive bowel sounds  Extremities: no edema  Pulse: equal pulses, 4/4  symmetric  Neurological: Alert and oriented X3, mood and affect normal  Musculoskeletal: normal strength and tone         Labs:                 Recent Labs  Lab 12/04/16  1850   Bilirubin, Total 0.6   Protein, Total 6.1   Albumin 3.7   ALT 12   AST (SGOT) 24       Recent Labs  Lab 12/07/16  0428   Magnesium 2.1       Recent Labs  Lab 12/04/16  1850   PT 13.5   PT INR 1.0   PTT 25       Recent Labs  Lab 12/07/16  0428 12/06/16  0448 12/05/16  2049   WBC 3.97 4.09 4.78   Hgb 9.2* 9.2* 9.5*   Hematocrit 29.2* 28.1* 29.3*   Platelets 128* 138* 138*       Recent Labs  Lab 12/07/16  0428 12/06/16  0448 12/05/16  0411   Sodium 139 142 139   Potassium 3.8 3.6 3.7   Chloride 108 109 109   CO2 22 22 21    BUN 20.0* 15.0 18.0   Creatinine 0.9 0.8 0.8   EGFR 59.8 >60.0 >60.0   Glucose 78 94 88   Calcium 8.4 8.4 8.5           Invalid input(s): FREET4    .No results found for: BNP     Weight Monitoring 11/10/2016 11/11/2016 12/03/2016 12/04/2016 12/06/2016 12/06/2016 12/07/2016   Height 162.6 cm 162.6 cm - 162.6 cm - - -   Height Method - Estimated - Stated - - -   Weight 61.236 kg 61.236 kg 61.236 kg 61.236 kg 59.966 kg 60.192 kg 59.784 kg   Weight Method - Standing Scale - Stated Standing Scale Standing Scale -   BMI (calculated) 23.2 kg/m2 23.2 kg/m2 - 23.2 kg/m2 - - -       Imaging:       ____________________________________________    Signed by: Lynford Humphrey, MD      [facility]  NP Spectralink [phone] (8am-5pm)  MD Spectralink [phone] or [phone] (8am-5pm)  Arrhythmia Spectralink [phone] (8am-4:30pm)  After hours, non urgent consult line [phone]  After Hours, urgent consults [phone]

## 2016-12-08 DIAGNOSIS — I2510 Atherosclerotic heart disease of native coronary artery without angina pectoris: Secondary | ICD-10-CM | POA: Clinically undetermined

## 2016-12-08 DIAGNOSIS — D62 Acute posthemorrhagic anemia: Secondary | ICD-10-CM | POA: Diagnosis present

## 2016-12-08 LAB — BASIC METABOLIC PANEL
BUN: 18 mg/dL (ref 7.0–19.0)
CO2: 24 mEq/L (ref 21–29)
Calcium: 8.3 mg/dL (ref 7.9–10.2)
Chloride: 109 mEq/L (ref 100–111)
Creatinine: 0.8 mg/dL (ref 0.4–1.5)
Glucose: 82 mg/dL (ref 70–100)
Potassium: 3.9 mEq/L (ref 3.5–5.1)
Sodium: 141 mEq/L (ref 136–145)

## 2016-12-08 LAB — HEMOLYSIS INDEX: Hemolysis Index: 8 (ref 0–18)

## 2016-12-08 LAB — CBC
Absolute NRBC: 0 10*3/uL
Hematocrit: 27.8 % — ABNORMAL LOW (ref 37.0–47.0)
Hgb: 8.9 g/dL — ABNORMAL LOW (ref 12.0–16.0)
MCH: 30.7 pg (ref 28.0–32.0)
MCHC: 32 g/dL (ref 32.0–36.0)
MCV: 95.9 fL (ref 80.0–100.0)
MPV: 9.9 fL (ref 9.4–12.3)
Nucleated RBC: 0 /100 WBC (ref 0.0–1.0)
Platelets: 129 10*3/uL — ABNORMAL LOW (ref 140–400)
RBC: 2.9 10*6/uL — ABNORMAL LOW (ref 4.20–5.40)
RDW: 16 % — ABNORMAL HIGH (ref 12–15)
WBC: 3.96 10*3/uL (ref 3.50–10.80)

## 2016-12-08 LAB — GFR: EGFR: >60

## 2016-12-08 LAB — MAGNESIUM: Magnesium: 1.9 mg/dL (ref 1.6–2.6)

## 2016-12-08 MED ORDER — FUROSEMIDE 10 MG/ML IJ SOLN
20.0000 mg | Freq: Every day | INTRAMUSCULAR | Status: DC
Start: 2016-12-09 — End: 2016-12-10
  Administered 2016-12-09: 09:00:00 20 mg via INTRAVENOUS
  Filled 2016-12-08 (×2): qty 4

## 2016-12-08 MED ORDER — PEG 3350-KCL-NABCB-NACL-NASULF 236 G PO SOLR
3000.0000 mL | Freq: Once | ORAL | Status: AC
Start: 2016-12-08 — End: 2016-12-08
  Administered 2016-12-08: 18:00:00 3000 mL via ORAL
  Filled 2016-12-08: qty 4000

## 2016-12-08 MED ORDER — PEG 3350-KCL-NABCB-NACL-NASULF 236 G PO SOLR
3000.0000 mL | Freq: Once | ORAL | Status: AC
Start: 2016-12-09 — End: 2016-12-09
  Administered 2016-12-09: 02:00:00 3000 mL via ORAL
  Filled 2016-12-08: qty 4000

## 2016-12-08 NOTE — Progress Notes (Signed)
Medicine Progress Note    Date Time: 12/08/16 3:36 PM  Patient Name: [name],[name]      Assessment & Plan    Assessment:   # GIB with both melena and blood clots-in setting plavix (xarelto on hold since 5/23)  # Acute blood loss anemia, symptomatic-s/p 2u prbc total--now stabilized  # Thrombocytopenia-stable overall  # S/p recent ASD closure 11/11/16  # Nonischemic CM EF 25%--dry weight ~135lbs  -LHC 10/17 w/o sig CAD  # Afib s/p ablation  # Hx of cardiac arrest 12/17 s/p ICD  # HLD  # Hypothyroid  # Severe DLCO-followed by pulm [place]  # Kyphoscoliosis with moderate restriction  # Celiac disease    Plan:   -tele; keep k>4 and mg>2  -serial h/h monitoring  -f/u GI and cards recs  -lasix 20mg  iv daily  -monitor bmp  -daily weights, strict is/os  -hold any antiplatelets/anticoagulation for now--plavix will need to be held x 5 days prior to endoscopic procedures and plan for CLD tomorrow and then EGD/Colonoscopy on Wednesday   -PPI IV bid  -incr oob/activity as tolerated    Foley: none  DVT Prophylaxis: scd alone d/t gib    Disposition:   Today's date: 12/08/2016   Clinical Milestones: stable h/h, gi clearance  Anticipated discharge needs: tbd    Signed by: Dorsie Burich Ashiq Yatzary Merriweather, MD            CC: GIB    Subjective:   No further blood in stool noted-had brown bm this am.Tol po well w/o abd pain, n/v. No LH/weakness. No CP/palps/sob. Feels overall stronger.      Medications     Current Facility-Administered Medications   Medication Dose Route Frequency   . carvedilol  3.125 mg Oral Q12H SCH   . fluticasone furoate  1 puff Inhalation QAM   . [START ON 12/09/2016] furosemide  20 mg Intravenous Daily   . gabapentin  100 mg Oral Q8H SCH   . levothyroxine  112 mcg Oral Daily at 0600   . pantoprazole  40 mg Intravenous BID   . simvastatin  10 mg Oral QHS          Physical Exam     Patient Vitals for the past 24 hrs:   BP Temp Temp src Pulse Resp SpO2 Weight   12/08/16 1157 94/67 97 F (36.1 C) Oral 97 12 97 % -   12/08/16  0835 127/69 - - 88 - - -   12/08/16 0726 120/67 97.5 F (36.4 C) Oral 82 12 96 % -   12/08/16 0534 - - - - - - 59.1 kg (130 lb 3.2 oz)   12/07/16 2327 103/58 97.4 F (36.3 C) - 82 16 95 % -   12/07/16 2018 117/66 - - 80 - - -   12/07/16 1936 114/63 97.5 F (36.4 C) - 82 16 99 % -     Body mass index is 22.35 kg/m.  Intake and Output Summary (Last 24 hours) at Date Time    Intake/Output Summary (Last 24 hours) at 12/08/16 1536  Last data filed at 12/08/16 0900   Gross per 24 hour   Intake              820 ml   Output             2050 ml   Net            -1230 ml       General: awake,  alert, oriented x 3; no acute distress.  Cardiovascular: regular rate and rhythm, no murmurs/rubs/gallops  Lungs: clear to auscultation bilaterally anteriorly, without wheezing/rales/rhonchi  Abdomen: soft, non-tender, non-distended; normoactive bowel sounds, no rebound or guarding  Extremities: no clubbing/cyanosis/edema    Labs & Imaging     Results     Procedure Component Value Units Date/Time    CBC without differential [[id number]]  (Abnormal) Collected:  12/08/16 0332    Specimen:  Blood from Blood Updated:  12/08/16 0510     WBC 3.96 x10 3/uL      Hgb 8.9 (L) g/dL      Hematocrit 40.9 (L) %      Platelets 129 (L) x10 3/uL      RBC 2.90 (L) x10 6/uL      MCV 95.9 fL      MCH 30.7 pg      MCHC 32.0 g/dL      RDW 16 (H) %      MPV 9.9 fL      Nucleated RBC 0.0 /100 WBC      Absolute NRBC 0.00 x10 3/uL     Narrative:       Please call for hgb <7.0    Basic Metabolic Panel [[id number]] Collected:  12/08/16 0332    Specimen:  Blood Updated:  12/08/16 0500     Glucose 82 mg/dL      BUN 95.6 mg/dL      Creatinine 0.8 mg/dL      Calcium 8.3 mg/dL      Sodium 213 mEq/L      Potassium 3.9 mEq/L      Chloride 109 mEq/L      CO2 24 mEq/L     Narrative:       Please call for hgb <7.0    Magnesium [[id number]] Collected:  12/08/16 0332    Specimen:  Blood Updated:  12/08/16 0500     Magnesium 1.9 mg/dL     Narrative:       Please call for hgb  <7.0    Hemolysis index [[id number]] Collected:  12/08/16 0332     Updated:  12/08/16 0500     Hemolysis Index 8    Narrative:       Please call for hgb <7.0    GFR [[id number]] Collected:  12/08/16 0332     Updated:  12/08/16 0500     EGFR >60.0    Narrative:       Please call for hgb <7.0    Prepare RBC: one unit RBC [[id number]] Collected:  12/04/16 1850     Updated:  12/08/16 0044     RBC Leukoreduced RBC Leukoreduced     BLUNIT [id number]     Status transfused     PRODUCT CODE (NON READABLE) E0336V00     Expiration Date [id number]     UTYPE A POS    Prepare RBC: one unit [[id number]] Collected:  12/04/16 1850     Updated:  12/08/16 0044     RBC Leukoreduced RBC Leukoreduced     BLUNIT [id number]     Status transfused     PRODUCT CODE (NON READABLE) K2706C37     Expiration Date [id number]     UTYPE A POS          Imaging personally reviewed, including:  Radiology Results (24 Hour)     ** No results found for the last 24 hours. **  Signed by: Ferron Ishmael Shela Nevin, MD

## 2016-12-08 NOTE — Plan of Care (Signed)
Problem: Safety  Goal: Patient will be free from injury during hospitalization  Outcome: Progressing   12/08/16 0042   Goal/Interventions addressed this shift   Patient will be free from injury during hospitalization  Assess patient's risk for falls and implement fall prevention plan of care per policy;Provide and maintain safe environment;Use appropriate transfer methods;Ensure appropriate safety devices are available at the bedside;Include patient/ family/ care giver in decisions related to safety;Hourly rounding       Problem: Altered GI Function  Goal: No bleeding  Outcome: Progressing   12/08/16 0042   Goal/Interventions addressed this shift   No bleeding  Monitor and assess vitals and hemodynamic parameters;Monitor/assess lab values and report abnormal values;Assess for bruising/petechia       Comments: Pt denies pain or discomfort, ambulatory in room. Voiding adequate amount,   No signs of bleeding noted, will continue to monitor.

## 2016-12-08 NOTE — Progress Notes (Signed)
MEDICINE PROGRESS NOTE    Date Time: 12/08/16 3:34 PM  Patient Name: Shearman,Inayah Y  Attending Physician: Nilsa Nutting Ashiq, *    Assessment:   Active Problems:    Gastrointestinal hemorrhage, unspecified gastrointestinal hemorrhage type  Resolved Problems:    * No resolved hospital problems. *    This is a 28 y/p female with h/o ASD closure 11/11/2016, nonischemic CM, afib s/p ablation, h/o cardiac arrest, admitted with GIB in the setting of Plavix      Plan:   1. GIB: appreciate GI input, planning for EGD/colonoscopy tomorrow 6/20, clear liquid diet, H/H have been stable  2. S/p ASD closure/NICM: appreciate card input, continue lasix, carvedilol, holding Plavix and Xarelto for now  3. Hypothyroidism: synthroid  4. HLD: zocor  5. FEN: clear liquid diet, replete electrolytes PRN  6. Dispo: pending clinical course    Case discussed with: pt, nurse    Safety Checklist:     DVT prophylaxis:  CHEST guideline (See page [id number]) Mechanical   Foley:  [place] Rn Foley protocol Not present   IVs:  Peripheral IV   PT/OT: Not needed   Daily CBC & or Chem ordered:  SHM/ABIM guidelines (see #5) Yes, due to clinical and lab instability   Reference for approximate charges of common labs: CBC auto diff - $76  BMP - $99  Mg - $79    Lines:     Patient Lines/Drains/Airways Status    Active PICC Line / CVC Line / PIV Line / Drain / Airway / Intraosseous Line / Epidural Line / ART Line / Line / Wound / Pressure Ulcer / NG/OG Tube     Name:   Placement date:   Placement time:   Site:   Days:    Peripheral IV 12/05/16 Right Forearm  12/05/16    2035    Forearm    2                 Disposition: (Please see PAF column for Expected D/C Date)   Today's date: 12/08/2016  Admit Date: 12/04/2016  6:36 PM  LOS: 4  Clinical Milestones:   Anticipated discharge needs:       Subjective     CC: <principal problem not specified>    Interval History/24 hour events: no acute events    HPI/Subjective: pt seen and examined, no active issues  overnight, pt offers no new complaints    Review of Systems:     As per HPI    Physical Exam:     VITAL SIGNS PHYSICAL EXAM   Temp:  [97 F (36.1 C)-97.5 F (36.4 C)] 97 F (36.1 C)  Heart Rate:  [80-97] 97  Resp Rate:  [12-16] 12  BP: (94-127)/(58-69) 94/67  Blood Glucose:    Telemetry:       Intake/Output Summary (Last 24 hours) at 12/08/16 1534  Last data filed at 12/08/16 0900   Gross per 24 hour   Intake              820 ml   Output             2050 ml   Net            -1230 ml    Physical Exam  General: awake, alert X oriented  Cardiovascular: regular rate and rhythm, no murmurs, rubs or gallops  Lungs: clear to auscultation bilaterally, without wheezing, rhonchi, or rales  Abdomen: soft, non-tender, non-distended; no palpable masses,  normoactive  bowel sounds  Extremities: no edema  Other:        Meds:     Medications were reviewed:  Current Facility-Administered Medications   Medication Dose Route Frequency   . carvedilol  3.125 mg Oral Q12H SCH   . fluticasone furoate  1 puff Inhalation QAM   . [START ON 12/09/2016] furosemide  20 mg Intravenous Daily   . gabapentin  100 mg Oral Q8H SCH   . levothyroxine  112 mcg Oral Daily at 0600   . pantoprazole  40 mg Intravenous BID   . simvastatin  10 mg Oral QHS     Current Facility-Administered Medications   Medication Dose Route Frequency Last Rate     Current Facility-Administered Medications   Medication Dose Route   . sodium chloride   Intravenous   . acetaminophen  500 mg Oral   . naloxone  0.2 mg Intravenous         Labs:     Labs (last 72 hours):      Recent Labs  Lab 12/08/16  0332 12/07/16  0428   WBC 3.96 3.97   Hgb 8.9* 9.2*   Hematocrit 27.8* 29.2*   Platelets 129* 128*         Recent Labs  Lab 12/04/16  1850   PT 13.5   PT INR 1.0   PTT 25      Recent Labs  Lab 12/08/16  0332 12/07/16  0428  12/04/16  1850   Sodium 141 139 More results in Results Review 138   Potassium 3.9 3.8 More results in Results Review 4.4   Chloride 109 108 More results in  Results Review 105   CO2 24 22 More results in Results Review 22   BUN 18.0 20.0* More results in Results Review 23.0*   Creatinine 0.8 0.9 More results in Results Review 0.9   Calcium 8.3 8.4 More results in Results Review 9.0   Albumin  --   --   --  3.7   Protein, Total  --   --   --  6.1   Bilirubin, Total  --   --   --  0.6   Alkaline Phosphatase  --   --   --  113*   ALT  --   --   --  12   AST (SGOT)  --   --   --  24   Glucose 82 78 More results in Results Review 102*   More results in Results Review = values in this interval not displayed.                Microbiology, reviewed and are significant for:  Microbiology Results     None          Imaging, reviewed and are significant for:        Signed by: Lonia Farber, PA

## 2016-12-08 NOTE — Progress Notes (Signed)
[place] [facility]ART PROGRESS NOTE  [facility]      Date Time: 12/08/16 12:15 PM  Patient Name: [name], [name]  Medical Record #:  [id number]  Account#:  [id number]  Admission Date:  12/04/2016         Patient Active Problem List   Diagnosis   . Celiac disease   . Scoliosis   . Atrial fibrillation   . Mitral valve disorder   . Disease of tricuspid valve   . Chronic airway obstruction, not elsewhere classified   . AF (atrial fibrillation)   . Paroxysmal a-fib   . CHF (congestive heart failure)   . Ischemic embolic stroke   . Decreased diffusion capacity   . PFO (patent foramen ovale)   . Acquired hypothyroidism   . Chronic systolic heart failure   . PAH (pulmonary artery hypertension)   . Non-rheumatic mitral regurgitation   . Respiratory failure   . Ischemic stroke   . Altered mental status, unspecified altered mental status type   . Closed pertrochanteric fracture of femur, left, initial encounter   . Torsades de pointes   . ASD (atrial septal defect)   . Gastrointestinal hemorrhage, unspecified gastrointestinal hemorrhage type       Subjective:   Denies chest pain or palpitations.    Assessment:    Presented with rectal bleeding and bloody stools x 1 week. Hgb 7.4   S/p ASD closure 11/11/2016   Chronic systolic CHF   Nonischemic cardiomyopathy with ejection fraction 25% November 2017, up to 45% October 06, 2016.   Recurrent atrial fibrillation noted on last visit Oct 27, 2016. Status post failure of amiodarone and extensive left and right atrial focus impulsive rotor modulation ablation with pulmonary vein isolation September 2017   Chronic Xarelto for anticoagulation   Out of hospital cardiac arrest December 2017, on Tikosyn and also with progressive decline in left ventricular function, treated with implantable cardioverter defibrillator implant December 2017.   Left heart cath 03/2016 revealed no significant CAD   Kyphoscoliosis with moderate restriction.   Hypothyroidism   Severe DLCO abnormality  followed by Dr. Stacy Gardner.    Hypothyroidism, on Synthroid.    Recommendations:      Daily weights and I/Os.   She generally requires both furosemide and metolazone to maintain euvolemia. She has not needed the metolazone since her ASD closure, Will need to monitor fluid balance closely, but would not add back metolazone now.   Continue Lasix 20mg  IV daily. Should she need additional transfusion would recommend additional Lasix after each unit.    Continue carvedilol 3.125mg  BID   GI following with plans for EGD/colonoscopy Wednesday. At elevated but not prohibitively high risk to proceed.   Currently off both Xarelto and Plavix. Ideally needs to be on Plavix through August 2018. Restart when deemed safe to do so.    Medications:      Scheduled Meds:        carvedilol 3.125 mg Oral Q12H SCH   fluticasone furoate 1 puff Inhalation QAM   gabapentin 100 mg Oral Q8H SCH   levothyroxine 112 mcg Oral Daily at 0600   pantoprazole 40 mg Intravenous BID   simvastatin 10 mg Oral QHS       Continuous Infusions:           Physical Exam:       VITAL SIGNS PHYSICAL EXAM   Vitals:    12/08/16 1157   BP: 94/67   Pulse: 97   Resp: 12   Temp:  97 F (36.1 C)   SpO2: 97%     Temp (24hrs), Avg:97.4 F (36.3 C), Min:97 F (36.1 C), Max:97.5 F (36.4 C)        Intake/Output Summary (Last 24 hours) at 12/08/16 1215  Last data filed at 12/08/16 0900   Gross per 24 hour   Intake              820 ml   Output             2350 ml   Net            -1530 ml    Physical Exam  General: awake, alert, breathing comfortably, no acute distress  Head: normocephalic  Eyes: EOM's intact  Cardiovascular: irregularly irregular rate and rhythm, no murmurs, rubs or gallops  Neck: no carotid bruits or JVD  Lungs: clear to auscultation bilateraly, without wheezing, rhonchi, or rales  Abdomen: soft, non-tender, non-distended; no palpable masses,  normoactive bowel sounds  Extremities: no edema  Pulse: equal pulses, 4/4 symmetric  Neurological: Alert and  oriented X3, mood and affect normal  Musculoskeletal: normal strength and tone         Labs:                 Recent Labs  Lab 12/04/16  1850   Bilirubin, Total 0.6   Protein, Total 6.1   Albumin 3.7   ALT 12   AST (SGOT) 24       Recent Labs  Lab 12/08/16  0332   Magnesium 1.9       Recent Labs  Lab 12/04/16  1850   PT 13.5   PT INR 1.0   PTT 25       Recent Labs  Lab 12/08/16  0332 12/07/16  0428 12/06/16  0448   WBC 3.96 3.97 4.09   Hgb 8.9* 9.2* 9.2*   Hematocrit 27.8* 29.2* 28.1*   Platelets 129* 128* 138*       Recent Labs  Lab 12/08/16  0332 12/07/16  0428 12/06/16  0448   Sodium 141 139 142   Potassium 3.9 3.8 3.6   Chloride 109 108 109   CO2 24 22 22    BUN 18.0 20.0* 15.0   Creatinine 0.8 0.9 0.8   EGFR >60.0 59.8 >60.0   Glucose 82 78 94   Calcium 8.3 8.4 8.4           Invalid input(s): FREET4    .No results found for: BNP     Weight Monitoring 11/11/2016 12/03/2016 12/04/2016 12/06/2016 12/06/2016 12/07/2016 12/08/2016   Height 162.6 cm - 162.6 cm - - - -   Height Method Estimated - Stated - - - -   Weight 61.236 kg 61.236 kg 61.236 kg 59.966 kg 60.192 kg 59.784 kg 59.058 kg   Weight Method Standing Scale - Stated Standing Scale Standing Scale - Standing Scale   BMI (calculated) 23.2 kg/m2 - 23.2 kg/m2 - - - -       Imaging:       ____________________________________________    Signed by: Lelan Pons, MD      [facility]  NP Spectralink [phone] (8am-5pm)  MD Spectralink [phone] or [phone] (8am-5pm)  Arrhythmia Spectralink [phone] (8am-4:30pm)  After hours, non urgent consult line [phone]  After Hours, urgent consults [phone]

## 2016-12-08 NOTE — Progress Notes (Signed)
Pt AOx4, VVS, on room air and denies pain at this time. Pt ambulatory in room, sitting in chair now and has been cooperative with care. Pt had a BM, no sign of bleeding, on clear liquid diet to prep for colonoscopy tomorrow. Floor mat in place, call bell in reach. Will continue to monitor.

## 2016-12-08 NOTE — Plan of Care (Signed)
Problem: Moderate/High Fall Risk Score >5  Goal: Patient will remain free of falls  Outcome: Progressing   12/08/16 1159   OTHER   High (Greater than 13) MOD-Initiate Yellow "Fall Risk" magnet communication tool       Problem: Safety  Goal: Patient will be free from injury during hospitalization  Outcome: Not Progressing   12/08/16 1159   Goal/Interventions addressed this shift   Patient will be free from injury during hospitalization  Assess patient's risk for falls and implement fall prevention plan of care per policy;Provide and maintain safe environment;Ensure appropriate safety devices are available at the bedside;Include patient/ family/ care giver in decisions related to safety;Assess for patients risk for elopement and implement Elopement Risk Plan per policy;Provide alternative method of communication if needed (communication boards, writing)       Problem: Pain  Goal: Pain at adequate level as identified by patient  Outcome: Progressing   12/08/16 1159   Goal/Interventions addressed this shift   Pain at adequate level as identified by patient Identify patient comfort function goal;Assess for risk of opioid induced respiratory depression, including snoring/sleep apnea. Alert healthcare team of risk factors identified.;Assess pain on admission, during daily assessment and/or before any "as needed" intervention(s);Reassess pain within 30-60 minutes of any procedure/intervention, per Pain Assessment, Intervention, Reassessment (AIR) Cycle;Evaluate if patient comfort function goal is met;Evaluate patient's satisfaction with pain management progress;Offer non-pharmacological pain management interventions;Include patient/patient care companion in decisions related to pain management as needed;Consult/collaborate with Physical Therapy, Occupational Therapy, and/or Speech Therapy       Problem: Altered GI Function  Goal: No bleeding  Outcome: Progressing   12/08/16 1159   Goal/Interventions addressed this shift    No bleeding  Monitor and assess vitals and hemodynamic parameters;Monitor/assess lab values and report abnormal values;Assess for bruising/petechia

## 2016-12-09 ENCOUNTER — Inpatient Hospital Stay: Payer: Medicare Other | Admitting: Anesthesiology

## 2016-12-09 ENCOUNTER — Encounter
Admission: EM | Disposition: A | Discharge status: Home / Self Care | Payer: Self-pay | Source: Home / Self Care | Attending: Hospitalist

## 2016-12-09 LAB — CBC
Absolute NRBC: 0 10*3/uL
Hematocrit: 35.1 % — ABNORMAL LOW (ref 37.0–47.0)
Hgb: 11.1 g/dL — ABNORMAL LOW (ref 12.0–16.0)
MCH: 30.9 pg (ref 28.0–32.0)
MCHC: 31.6 g/dL — ABNORMAL LOW (ref 32.0–36.0)
MCV: 97.8 fL (ref 80.0–100.0)
MPV: 10.3 fL (ref 9.4–12.3)
Nucleated RBC: 0 /100 WBC (ref 0.0–1.0)
Platelets: 190 10*3/uL (ref 140–400)
RBC: 3.59 10*6/uL — ABNORMAL LOW (ref 4.20–5.40)
RDW: 16 % — ABNORMAL HIGH (ref 12–15)
WBC: 5.7 10*3/uL (ref 3.50–10.80)

## 2016-12-09 LAB — HEMOLYSIS INDEX: Hemolysis Index: 3 (ref 0–18)

## 2016-12-09 LAB — BASIC METABOLIC PANEL
BUN: 13 mg/dL (ref 7.0–19.0)
CO2: 28 mEq/L (ref 21–29)
Calcium: 9.4 mg/dL (ref 7.9–10.2)
Chloride: 102 mEq/L (ref 100–111)
Creatinine: 0.9 mg/dL (ref 0.4–1.5)
Glucose: 91 mg/dL (ref 70–100)
Potassium: 4 mEq/L (ref 3.5–5.1)
Sodium: 141 mEq/L (ref 136–145)

## 2016-12-09 LAB — GFR: EGFR: 59.8

## 2016-12-09 LAB — MAGNESIUM: Magnesium: 2 mg/dL (ref 1.6–2.6)

## 2016-12-09 SURGERY — EGD, COLONOSCOPY
Anesthesia: Anesthesia General | Site: Mouth

## 2016-12-09 MED ORDER — PROPOFOL 10 MG/ML IV EMUL (WRAP)
INTRAVENOUS | Status: DC | PRN
Start: 2016-12-09 — End: 2016-12-09
  Administered 2016-12-09: 50 mg via INTRAVENOUS

## 2016-12-09 MED ORDER — LACTATED RINGERS IV SOLN
INTRAVENOUS | Status: DC | PRN
Start: 2016-12-09 — End: 2016-12-09

## 2016-12-09 MED ORDER — LIDOCAINE HCL (PF) 2 % IJ SOLN
INTRAMUSCULAR | Status: AC
Start: 2016-12-09 — End: ?
  Filled 2016-12-09: qty 5

## 2016-12-09 MED ORDER — ONDANSETRON HCL 4 MG/2ML IJ SOLN
INTRAMUSCULAR | Status: DC | PRN
Start: 2016-12-09 — End: 2016-12-09
  Administered 2016-12-09: 4 mg via INTRAVENOUS

## 2016-12-09 MED ORDER — PHENYLEPHRINE HCL 10 MG/ML IV SOLN (WRAP)
Status: DC | PRN
Start: 2016-12-09 — End: 2016-12-09
  Administered 2016-12-09 (×4): 100 ug via INTRAVENOUS

## 2016-12-09 MED ORDER — LIDOCAINE HCL (PF) 2 % IJ SOLN
INTRAMUSCULAR | Status: DC | PRN
Start: 2016-12-09 — End: 2016-12-09
  Administered 2016-12-09: 50 mg via INTRAVENOUS

## 2016-12-09 MED ORDER — PROPOFOL 10 MG/ML IV EMUL (WRAP)
INTRAVENOUS | Status: AC
Start: 2016-12-09 — End: ?
  Filled 2016-12-09: qty 50

## 2016-12-09 MED ORDER — PROPOFOL INFUSION 10 MG/ML
INTRAVENOUS | Status: DC | PRN
Start: 2016-12-09 — End: 2016-12-09
  Administered 2016-12-09: 140 ug/kg/min via INTRAVENOUS

## 2016-12-09 SURGICAL SUPPLY — 27 items
BASIN EMESIS ROSE (Patient Supply) ×2 IMPLANT
BLOCK BITE MAXI 60FR LF STRD STRAP SDPRT (Procedure Accessories) ×1
BLOCK BITE OD60 FR STURDY STRAP SIDEPORT (Procedure Accessories) ×1
BLOCK BITE OD60 FR STURDY STRAP SIDEPORT DENTAL RETENTION RIM MAXI (Procedure Accessories) ×1 IMPLANT
CANISTER SCT 1500CC SAFELINER LF NS SMRG (Suction) ×1
CANISTER SUCTION 1500 CC SEMIRIGID 1 (Suction) ×1
CANISTER SUCTION 1500 CC SEMIRIGID 1 ELBOW FILTER SELF ALIGN LID (Suction) ×1 IMPLANT
CONTAINER HISTOLOGY 60 ML 30 ML GRADUATE LEAK RESISTANT O RING PREFILL (Procedure Accessories) ×1 IMPLANT
DEVICE ENDOSCOPIC RAPID EXCHANGE BIOPSY (Procedure Accessories) ×1
DEVICE ENDOSCOPIC RAPID EXCHANGE BIOPSY CAP OLYMPUS (Procedure Accessories) ×1 IMPLANT
DEVICE ESCP STRL RX BX CAP DISP OLMPS (Procedure Accessories) ×1
FORCEP BIOPSY 240CM RADIALJAW (Instrument) ×1 IMPLANT
GLOVES EXAM NITRILE ETS LG NS (Glove) ×2 IMPLANT
GOWN ISL PP PE REG LG LF FULL BCK NK TIE (Gown) ×2
GOWN ISOLATION REGULAR LARGE FULL BACK NECK TIE ELASTIC CUFF (Gown) ×1 IMPLANT
JELLY LUBE STRL FLPTOP 2OZ (Procedure Accessories) ×2 IMPLANT
SOL FORMALIN 10% PREFILL 30ML (Procedure Accessories)
SPONGE GAUZE L4 IN X W4 IN 4 PLY HIGH (Sponge) ×1
SPONGE GAUZE L4 IN X W4 IN 4 PLY NONWOVEN LINT FREE CURITY RAYON (Sponge) ×1 IMPLANT
SPONGE GZE RYN PLSTR CRTY 4X4IN LF NS 4 (Sponge) ×1
SYRINGE 50 ML GRADUATE NONPYROGENIC DEHP (Syringes, Needles) ×1
SYRINGE 50 ML GRADUATE NONPYROGENIC DEHP FREE PVC FREE BD MEDICAL (Syringes, Needles) ×1 IMPLANT
SYRINGE MED 50ML LF STRL GRAD N-PYRG (Syringes, Needles) ×1
TUBING ENDOSCOPY EXTENSION (Endoscopic Supplies) ×2 IMPLANT
WATER STERILE PLASTIC POUR BOTTLE 1000 (Irrigation Solutions) ×2
WATER STERILE PLASTIC POUR BOTTLE 1000 ML (Irrigation Solutions) ×2 IMPLANT
WATER STRL 1000ML LF PLS PR BTL (Irrigation Solutions) ×2

## 2016-12-09 NOTE — Op Note (Signed)
Video Capsule Endoscopy given to patient at [facility] as per Dr.Harndern. Patient verbalized understanding of the instruction during capsule endoscopy.Patient tolerated well during the swallowing of the pillcam. Report given to bedside RN Fleet Contras regarding the video capsule.

## 2016-12-09 NOTE — Anesthesia Postprocedure Evaluation (Signed)
Anesthesia Post Evaluation    Patient: [name]    Procedure(s):  EGD, COLONOSCOPY    Anesthesia type: General TIVA    Last Vitals:   Vitals:    12/09/16 1240   BP: 113/62   Pulse: 86   Resp: 18   Temp:    SpO2: 97%         Patient Location: [facility]    Post Pain: Patient not complaining of pain, continue current therapy    Mental Status: awake and alert    Respiratory Function: tolerating room air    Cardiovascular: stable    Nausea/Vomiting: patient not complaining of nausea or vomiting    Hydration Status: adequate    Post Assessment: no apparent anesthetic complications, no reportable events and no evidence of recall          Anesthesia Qualified Clinical Data Registry 2018    PACU Reintubation  Did the Patient have general anesthesia with intubation: No        PONV Adult  Is the patient aged 20 or older: Yes  Did the patient receive recieve a general anesthestic: No          PONV Pediatric  Is the patient aged 1-17? No            PACU Transfer Checklist Protocol  Was the patient transferred to the PACU at the conclusion of surgery? Yes  Was a checklist or transfer protocol used? Yes    ICU Transfer Checklist Protocol  Was the patient transferred to the ICU at the conclusion of surgery? No      Post-op Pain Assessment Prior to Anesthesia Care End  Age >=18 and assessed for pain in PACU: Yes  Pacu pain score <7/10: Yes      Perioperative Mortality  Perioperative mortality prior to Anesthesia end time: No    Perioperative Cardiac Arrest  Did the patient have an unanticipated intraoperative cardiac arrest between anesthesia start time and anesthesia end time? No    Unplanned Admission to ICU  Did the patient have an unplanned admission to the ICU (not initially anticipated at anesthesia start time)? No      Signed by: Lolita Cram , 12/09/2016 1:49 PM

## 2016-12-09 NOTE — Plan of Care (Signed)
Problem: Moderate/High Fall Risk Score >5  Goal: Patient will remain free of falls   12/09/16 0300   OTHER   Moderate Risk (6-13) MOD-Floor mat at bedside (where available) if appropriate;MOD-Remain with patient during toileting;MOD-Place Fall Risk level on whiteboard in room;MOD-Use of assistive devices-bedside commode if appropriate       Problem: Safety  Goal: Patient will be free from injury during hospitalization  Outcome: Progressing   12/08/16 1159   Goal/Interventions addressed this shift   Patient will be free from injury during hospitalization  Assess patient's risk for falls and implement fall prevention plan of care per policy;Provide and maintain safe environment;Ensure appropriate safety devices are available at the bedside;Include patient/ family/ care giver in decisions related to safety;Assess for patients risk for elopement and implement Elopement Risk Plan per policy;Provide alternative method of communication if needed (communication boards, writing)       Problem: Pain  Goal: Pain at adequate level as identified by patient  Outcome: Progressing   12/09/16 0349   Goal/Interventions addressed this shift   Pain at adequate level as identified by patient Identify patient comfort function goal;Assess for risk of opioid induced respiratory depression, including snoring/sleep apnea. Alert healthcare team of risk factors identified.;Assess pain on admission, during daily assessment and/or before any "as needed" intervention(s);Reassess pain within 30-60 minutes of any procedure/intervention, per Pain Assessment, Intervention, Reassessment (AIR) Cycle;Evaluate if patient comfort function goal is met;Evaluate patient's satisfaction with pain management progress       Problem: Altered GI Function  Goal: No bleeding  Outcome: Progressing   12/09/16 0349   Goal/Interventions addressed this shift   No bleeding  Monitor and assess vitals and hemodynamic parameters;Monitor/assess lab values and report abnormal  values;Assess for bruising/petechia       Comments: Pt AOx4, VSS, RA, afebrile. Ptt ambulatory in room and has been calm/cooperative with care.No sign of bleeding. Preperad scheduled Colo/EGD and check list done. Safety maintained, will continue to monitor.

## 2016-12-09 NOTE — Plan of Care (Addendum)
Back to room after EGD, A&Ox4, ambulatory with standby assist, npo until 2:30 pm, can start clear liquid, and some solid at 4;30 non colored, and normal diet at 8:30 PM, pt voids and has clear BM, when she wipes, and see little bit bright red color on tissue, she has hemorroids, VSS, continue monitor.   Problem: Moderate/High Fall Risk Score >5  Goal: Patient will remain free of falls  Outcome: Progressing   12/09/16 1401   OTHER   Moderate Risk (6-13) MOD-Use of assistive devices-bedside commode if appropriate;MOD-Consider a move closer to Nurses Station;MOD-Remain with patient during toileting;MOD-Use of chair-pad alarm when appropriate;MOD-Floor mat at bedside (where available) if appropriate       Problem: Altered GI Function  Goal: No bleeding  Outcome: Progressing   12/09/16 1404   Goal/Interventions addressed this shift   No bleeding  Monitor and assess vitals and hemodynamic parameters;Monitor/assess lab values and report abnormal values;Assess for bruising/petechia

## 2016-12-09 NOTE — Progress Notes (Signed)
Per shift report, pt finished prep at 6 am, and stool has been clear.

## 2016-12-09 NOTE — Progress Notes (Signed)
[place] [facility] PROGRESS NOTE  [facility]      Date Time: 12/09/16 9:41 AM  Patient Name: [name], [name]  Medical Record #:  [id number]  Account#:  [id number]  Admission Date:  12/04/2016         Patient Active Problem List   Diagnosis   . Celiac disease   . Scoliosis   . Atrial fibrillation   . Mitral valve disorder   . Disease of tricuspid valve   . Chronic airway obstruction, not elsewhere classified   . AF (atrial fibrillation)   . Paroxysmal a-fib   . CHF (congestive heart failure)   . Ischemic embolic stroke   . Decreased diffusion capacity   . PFO (patent foramen ovale)   . Acquired hypothyroidism   . Chronic systolic heart failure   . PAH (pulmonary artery hypertension)   . Non-rheumatic mitral regurgitation   . Respiratory failure   . Ischemic stroke   . Altered mental status, unspecified altered mental status type   . Closed pertrochanteric fracture of femur, left, initial encounter   . Torsades de pointes   . ASD (atrial septal defect)   . Gastrointestinal hemorrhage, unspecified gastrointestinal hemorrhage type   . Coronary artery disease involving native heart without angina pectoris, unspecified vessel or lesion type   . Acute blood loss anemia   . Anemia       Subjective:   Denies chest pain or palpitations.  No SOB.    Tele: AF      Assessment:    Presented with rectal bleeding and bloody stools x 1 week. Hgb 7.4. Was on Plavix (ASD closure recently) and Xarelto (AF)   S/p ASD closure 11/11/2016   Chronic systolic CHF, volume status normalizing on iv lasix after recent transfusions.   Nonischemic cardiomyopathy with ejection fraction 25% November 2017, up to 45% October 06, 2016.   Recurrent atrial fibrillation noted on last visit Oct 27, 2016. Status post failure of amiodarone and extensive left and right atrial focus impulsive rotor modulation ablation with pulmonary vein isolation September 2017   Chronic Xarelto for anticoagulation   Out of hospital cardiac arrest December 2017, on  Tikosyn and also with progressive decline in left ventricular function, treated with implantable cardioverter defibrillator implant December 2017.   Left heart cath 03/2016 revealed no significant CAD   Kyphoscoliosis with moderate restriction.   Hypothyroidism   Severe DLCO abnormality followed by Dr. Stacy Gardner.    Hypothyroidism, on Synthroid.    Recommendations:      Daily weights and I/Os.   She generally requires both furosemide and metolazone to maintain euvolemia. She has not needed the metolazone since her ASD closure, Will need to monitor fluid balance closely, but would not add back metolazone now.   Continue Lasix 20mg  IV today and resume 40 mg po daily in AM.   Continue carvedilol 3.125mg  BID   GI following with plans for EGD/colonoscopy today. At elevated but not prohibitively high risk to proceed.   Currently off both Xarelto and Plavix. Ideally needs to be on Plavix through August 2018. Readdress after endoscopy.    Medications:      Scheduled Meds:        carvedilol 3.125 mg Oral Q12H SCH   fluticasone furoate 1 puff Inhalation QAM   furosemide 20 mg Intravenous Daily   gabapentin 100 mg Oral Q8H SCH   levothyroxine 112 mcg Oral Daily at 0600   pantoprazole 40 mg Intravenous BID   simvastatin 10 mg Oral  QHS       Continuous Infusions:           Physical Exam:       VITAL SIGNS PHYSICAL EXAM   Vitals:    12/09/16 0912   BP: 112/57   Pulse: 80   Resp:    Temp:    SpO2:      Temp (24hrs), Avg:96.8 F (36 C), Min:95.7 F (35.4 C), Max:97.3 F (36.3 C)        Intake/Output Summary (Last 24 hours) at 12/09/16 0941  Last data filed at 12/08/16 1300   Gross per 24 hour   Intake              422 ml   Output                0 ml   Net              422 ml    Physical Exam  General: awake, alert, breathing comfortably, no acute distress  Head: normocephalic  Eyes: EOM's intact  Cardiovascular: irregularly irregular rate and rhythm, no murmurs, rubs or gallops  Neck: no carotid bruits or JVD  Lungs:  clear to auscultation bilateraly, without wheezing, rhonchi, or rales  Abdomen: soft, non-tender, non-distended; no palpable masses,  normoactive bowel sounds  Extremities: no edema  Pulse: equal pulses, 4/4 symmetric  Neurological: Alert and oriented X3, mood and affect normal  Musculoskeletal: normal strength and tone         Labs:                 Recent Labs  Lab 12/04/16  1850   Bilirubin, Total 0.6   Protein, Total 6.1   Albumin 3.7   ALT 12   AST (SGOT) 24       Recent Labs  Lab 12/09/16  0543   Magnesium 2.0       Recent Labs  Lab 12/04/16  1850   PT 13.5   PT INR 1.0   PTT 25       Recent Labs  Lab 12/09/16  0543 12/08/16  0332 12/07/16  0428   WBC 5.70 3.96 3.97   Hgb 11.1* 8.9* 9.2*   Hematocrit 35.1* 27.8* 29.2*   Platelets 190 129* 128*       Recent Labs  Lab 12/09/16  0543 12/08/16  0332 12/07/16  0428   Sodium 141 141 139   Potassium 4.0 3.9 3.8   Chloride 102 109 108   CO2 28 24 22    BUN 13.0 18.0 20.0*   Creatinine 0.9 0.8 0.9   EGFR 59.8 >60.0 59.8   Glucose 91 82 78   Calcium 9.4 8.3 8.4           Invalid input(s): FREET4    .No results found for: BNP     Weight Monitoring 12/03/2016 12/04/2016 12/06/2016 12/06/2016 12/07/2016 12/08/2016 12/09/2016   Height - 162.6 cm - - - - -   Height Method - Stated - - - - -   Weight 61.236 kg 61.236 kg 59.966 kg 60.192 kg 59.784 kg 59.058 kg 58.877 kg   Weight Method - Stated Standing Scale Standing Scale - Standing Scale Standing Scale   BMI (calculated) - 23.2 kg/m2 - - - - -       Imaging:       ____________________________________________    Signed by: Lelan Pons, MD      [facility]  NP Spectralink [phone] (8am-5pm)  MD Spectralink [phone] or [phone] (8am-5pm)  Arrhythmia Spectralink [phone] (8am-4:30pm)  After hours, non urgent consult line [phone]  After Hours, urgent consults [phone]

## 2016-12-09 NOTE — Progress Notes (Deleted)
[place] [facility] PROGRESS NOTE  [facility]      Date Time: 12/09/16 9:31 AM  Patient Name: [name], [name]  Medical Record #:  [id number]  Account#:  [id number]  Admission Date:  12/04/2016         Patient Active Problem List   Diagnosis   . Celiac disease   . Scoliosis   . Atrial fibrillation   . Mitral valve disorder   . Disease of tricuspid valve   . Chronic airway obstruction, not elsewhere classified   . AF (atrial fibrillation)   . Paroxysmal a-fib   . CHF (congestive heart failure)   . Ischemic embolic stroke   . Decreased diffusion capacity   . PFO (patent foramen ovale)   . Acquired hypothyroidism   . Chronic systolic heart failure   . PAH (pulmonary artery hypertension)   . Non-rheumatic mitral regurgitation   . Respiratory failure   . Ischemic stroke   . Altered mental status, unspecified altered mental status type   . Closed pertrochanteric fracture of femur, left, initial encounter   . Torsades de pointes   . ASD (atrial septal defect)   . Gastrointestinal hemorrhage, unspecified gastrointestinal hemorrhage type   . Coronary artery disease involving native heart without angina pectoris, unspecified vessel or lesion type   . Acute blood loss anemia   . Anemia       Subjective:   Denies chest pain or palpitations.    Assessment:    Presented with rectal bleeding and bloody stools x 1 week. Hgb 7.4. Was on Plavix (ASD closure recently) and Xarelto (AF)   S/p ASD closure 11/11/2016   Chronic systolic CHF, volume status normalizing on iv lasix after recent transfusions.   Nonischemic cardiomyopathy with ejection fraction 25% November 2017, up to 45% October 06, 2016.   Recurrent atrial fibrillation noted on last visit Oct 27, 2016. Status post failure of amiodarone and extensive left and right atrial focus impulsive rotor modulation ablation with pulmonary vein isolation September 2017   Chronic Xarelto for anticoagulation   Out of hospital cardiac arrest December 2017, on Tikosyn and also with  progressive decline in left ventricular function, treated with implantable cardioverter defibrillator implant December 2017.   Left heart cath 03/2016 revealed no significant CAD   Kyphoscoliosis with moderate restriction.   Hypothyroidism   Severe DLCO abnormality followed by Dr. Stacy Gardner.    Hypothyroidism, on Synthroid.    Recommendations:      Daily weights and I/Os.   She generally requires both furosemide and metolazone to maintain euvolemia. She has not needed the metolazone since her ASD closure, Will need to monitor fluid balance closely, but would not add back metolazone now.   Continue Lasix 20mg  IV today and resume 40 mg po daily in AM.   Continue carvedilol 3.125mg  BID   GI following with plans for EGD/colonoscopy today. At elevated but not prohibitively high risk to proceed.   Currently off both Xarelto and Plavix. Ideally needs to be on Plavix through August 2018. Readdress after endoscopy.    Medications:      Scheduled Meds:        carvedilol 3.125 mg Oral Q12H SCH   fluticasone furoate 1 puff Inhalation QAM   furosemide 20 mg Intravenous Daily   gabapentin 100 mg Oral Q8H SCH   levothyroxine 112 mcg Oral Daily at 0600   pantoprazole 40 mg Intravenous BID   simvastatin 10 mg Oral QHS       Continuous Infusions:  Physical Exam:       VITAL SIGNS PHYSICAL EXAM   Vitals:    12/09/16 0912   BP: 112/57   Pulse: 80   Resp:    Temp:    SpO2:      Temp (24hrs), Avg:96.8 F (36 C), Min:95.7 F (35.4 C), Max:97.3 F (36.3 C)        Intake/Output Summary (Last 24 hours) at 12/09/16 0931  Last data filed at 12/08/16 1300   Gross per 24 hour   Intake              422 ml   Output                0 ml   Net              422 ml    Physical Exam  General: awake, alert, breathing comfortably, no acute distress  Head: normocephalic  Eyes: EOM's intact  Cardiovascular: irregularly irregular rate and rhythm, no murmurs, rubs or gallops  Neck: no carotid bruits or JVD  Lungs: clear to auscultation  bilateraly, without wheezing, rhonchi, or rales  Abdomen: soft, non-tender, non-distended; no palpable masses,  normoactive bowel sounds  Extremities: no edema  Pulse: equal pulses, 4/4 symmetric  Neurological: Alert and oriented X3, mood and affect normal  Musculoskeletal: normal strength and tone         Labs:                 Recent Labs  Lab 12/04/16  1850   Bilirubin, Total 0.6   Protein, Total 6.1   Albumin 3.7   ALT 12   AST (SGOT) 24       Recent Labs  Lab 12/09/16  0543   Magnesium 2.0       Recent Labs  Lab 12/04/16  1850   PT 13.5   PT INR 1.0   PTT 25       Recent Labs  Lab 12/09/16  0543 12/08/16  0332 12/07/16  0428   WBC 5.70 3.96 3.97   Hgb 11.1* 8.9* 9.2*   Hematocrit 35.1* 27.8* 29.2*   Platelets 190 129* 128*       Recent Labs  Lab 12/09/16  0543 12/08/16  0332 12/07/16  0428   Sodium 141 141 139   Potassium 4.0 3.9 3.8   Chloride 102 109 108   CO2 28 24 22    BUN 13.0 18.0 20.0*   Creatinine 0.9 0.8 0.9   EGFR 59.8 >60.0 59.8   Glucose 91 82 78   Calcium 9.4 8.3 8.4           Invalid input(s): FREET4    .No results found for: BNP     Weight Monitoring 12/03/2016 12/04/2016 12/06/2016 12/06/2016 12/07/2016 12/08/2016 12/09/2016   Height - 162.6 cm - - - - -   Height Method - Stated - - - - -   Weight 61.236 kg 61.236 kg 59.966 kg 60.192 kg 59.784 kg 59.058 kg 58.877 kg   Weight Method - Stated Standing Scale Standing Scale - Standing Scale Standing Scale   BMI (calculated) - 23.2 kg/m2 - - - - -       Imaging:       ____________________________________________    Signed by: Lelan Pons, MD      [facility]  NP Spectralink [phone] (8am-5pm)  MD Spectralink [phone] or [phone] (8am-5pm)  Arrhythmia Spectralink [phone] (8am-4:30pm)  After hours, non urgent consult line  [phone]  After Hours, urgent consults [phone]

## 2016-12-09 NOTE — Progress Notes (Signed)
MEDICINE PROGRESS NOTE    Date Time: 12/09/16 3:19 PM  Patient Name: TeresaTeresa Young  Attending Physician: Nilsa Nutting Ashiq, *    Assessment:   Principal Problem:    Anemia  Active Problems:    Gastrointestinal hemorrhage, unspecified gastrointestinal hemorrhage type    Coronary artery disease involving native heart without angina pectoris, unspecified vessel or lesion type    Acute blood loss anemia  Resolved Problems:    * No resolved hospital problems. *    This is a 77 Young/p female with h/o ASD closure 11/11/2016, nonischemic CM, afib s/p ablation, h/o cardiac arrest, admitted with GIB in the setting of Plavix      Plan:   1. GIB: appreciate GI input, scheduled for EGD/colonoscopy today, NPO, H/H have been stable  2. S/p ASD closure/NICM: appreciate card input, continue lasix, carvedilol, holding Plavix and Xarelto for now  3. Hypothyroidism: synthroid  4. HLD: zocor  5. FEN: NPO, replete electrolytes PRN  6. Dispo: pending clinical course  Case discussed with: pt, nurse    Safety Checklist:     DVT prophylaxis:  CHEST guideline (See page [id number]) Chemical and / or mechanical ppx NOT indicated or contraindicated: Active Bleeding    Foley:  [place] Rn Foley protocol Not present   IVs:  Peripheral IV   PT/OT: Not needed   Daily CBC & or Chem ordered:  SHM/ABIM guidelines (see #5) Yes, due to clinical and lab instability   Reference for approximate charges of common labs: CBC auto diff - $76  BMP - $99  Mg - $79    Lines:     Patient Lines/Drains/Airways Status    Active PICC Line / CVC Line / PIV Line / Drain / Airway / Intraosseous Line / Epidural Line / ART Line / Line / Wound / Pressure Ulcer / NG/OG Tube     Name:   Placement date:   Placement time:   Site:   Days:    Peripheral IV 12/05/16 Right Forearm  12/05/16    2035    Forearm    3                 Disposition: (Please see PAF column for Expected D/C Date)   Today's date: 12/09/2016  Admit Date: 12/04/2016  6:36 PM  LOS: 5  Clinical Milestones:    Anticipated discharge needs:       Subjective     CC: Anemia    Interval History/24 hour events: no acute events    HPI/Subjective: pt seen and examined, no active issues overnight, pt offers no new complaints    Review of Systems:     As per HPI    Physical Exam:     VITAL SIGNS PHYSICAL EXAM   Temp:  [95.7 F (35.4 C)-98.3 F (36.8 C)] 98.3 F (36.8 C)  Heart Rate:  [56-86] 86  Resp Rate:  [14-18] 18  BP: (83-119)/(50-75) 113/62  Blood Glucose:    Telemetry:       Intake/Output Summary (Last 24 hours) at 12/09/16 1519  Last data filed at 12/09/16 1135   Gross per 24 hour   Intake              200 ml   Output                0 ml   Net              200 ml    Physical Exam  General: awake, alert X oriented  Cardiovascular: regular rate and rhythm, no murmurs, rubs or gallops  Lungs: clear to auscultation bilaterally, without wheezing, rhonchi, or rales  Abdomen: soft, non-tender, non-distended; no palpable masses,  normoactive bowel sounds  Extremities: no edema  Other:        Meds:     Medications were reviewed:  Current Facility-Administered Medications   Medication Dose Route Frequency   . carvedilol  3.125 mg Oral Q12H SCH   . fluticasone furoate  1 puff Inhalation QAM   . furosemide  20 mg Intravenous Daily   . gabapentin  100 mg Oral Q8H SCH   . levothyroxine  112 mcg Oral Daily at 0600   . pantoprazole  40 mg Intravenous BID   . simvastatin  10 mg Oral QHS     Current Facility-Administered Medications   Medication Dose Route Frequency Last Rate     Current Facility-Administered Medications   Medication Dose Route   . sodium chloride   Intravenous   . acetaminophen  500 mg Oral   . naloxone  0.2 mg Intravenous         Labs:     Labs (last 72 hours):      Recent Labs  Lab 12/09/16  0543 12/08/16  0332   WBC 5.70 3.96   Hgb 11.1* 8.9*   Hematocrit 35.1* 27.8*   Platelets 190 129*         Recent Labs  Lab 12/04/16  1850   PT 13.5   PT INR 1.0   PTT 25      Recent Labs  Lab 12/09/16  0543 12/08/16  0332   12/04/16  1850   Sodium 141 141 More results in Results Review 138   Potassium 4.0 3.9 More results in Results Review 4.4   Chloride 102 109 More results in Results Review 105   CO2 28 24 More results in Results Review 22   BUN 13.0 18.0 More results in Results Review 23.0*   Creatinine 0.9 0.8 More results in Results Review 0.9   Calcium 9.4 8.3 More results in Results Review 9.0   Albumin  --   --   --  3.7   Protein, Total  --   --   --  6.1   Bilirubin, Total  --   --   --  0.6   Alkaline Phosphatase  --   --   --  113*   ALT  --   --   --  12   AST (SGOT)  --   --   --  24   Glucose 91 82 More results in Results Review 102*   More results in Results Review = values in this interval not displayed.                Microbiology, reviewed and are significant for:  Microbiology Results     None          Imaging, reviewed and are significant for:        Signed by: Lonia Farber, PA

## 2016-12-09 NOTE — Transfer of Care (Signed)
Anesthesia Transfer of Care Note    Patient: [name]    Procedures performed: Procedure(s):  EGD, COLONOSCOPY    Anesthesia type: General TIVA    Patient location:Phase I PACU    Last vitals:   Vitals:    12/09/16 1149   BP: (!) 83/50   Pulse: 80   Resp: 18   Temp:    SpO2: 96%       Post pain: Patient not complaining of pain, continue current therapy      Mental Status:awake and sedated    Respiratory Function: tolerating room air    Cardiovascular: stable    Nausea/Vomiting: patient not complaining of nausea or vomiting    Hydration Status: adequate    Post assessment: no apparent anesthetic complications, no reportable events and no evidence of recall    Signed by: Lolita Cram   12/09/16 12:03 PM

## 2016-12-09 NOTE — Anesthesia Preprocedure Evaluation (Signed)
Anesthesia Evaluation    AIRWAY    Mallampati: II    TM distance: >3 FB  Neck ROM: full  Mouth Opening:full   CARDIOVASCULAR    cardiovascular exam normal, regular and normal       DENTAL    no notable dental hx     PULMONARY    pulmonary exam normal and clear to auscultation     OTHER FINDINGS              Relevant Problems   No relevant active problems               Anesthesia Plan    ASA 3     general                     intravenous induction   Detailed anesthesia plan: general IV            informed consent obtained    ECG reviewed  pertinent labs reviewed             ===============================================================  Inpatient Anesthesia Evaluation    Patient Name: [name],[name]  Surgeon: Thurnell Lose, MD  Patient Age / Sex: 83 Young.o. / female    Medical History:     Past Medical History:   Diagnosis Date   . Abnormal vision     Dry eyes; marginal vision L eye   . Abnormal vision     Wears glasses   . Anemia     controlled on meds   . Aortic valve insufficiency    . Arrhythmia     Atrial fibrillation   . Arthritis     osteoarthritis, back and pelvis   . AVD (aortic valve disease)    . Benign heart murmur    . Bilateral cataracts     surgery, right eye   . Celiac disease    . Chronic obstructive pulmonary disease     mild-mod   . Difficulty walking     Back pain; Uses cane   . Disease of lung    . Disorder of musculoskeletal system ?    ?   Marland Kitchen Dyspnea on exertion    . Ear, nose and throat disorder     Hoarseness   . Encounter for cardioversion procedure 10/31/2013   . Gastroesophageal reflux disease    . GERD (gastroesophageal reflux disease)     occasional , asymptomatic   . Headache     infrequently   . Heart murmur    . Hyperlipidemia     controlled with meds   . Hypertension     controlled w/meds   . Hypothyroidism     controlled with meds   . Low back pain    . Mitral valve insufficiency    . Mitral valve prolapse    . Orthopedic aftercare     scoliosis, stenosis, athritis   . Osteoporosis     . Pacemaker 05/2016    Bring card [place]   . Pneumonia     had pneumonia shot 2010 and 2015   . Primary cardiomyopathy    . Scoliosis     Severe   . Shortness of breath    . Thyroid disease    . Vein disorder     bilateral legs       Past Surgical History:   Procedure Laterality Date   . ADENOIDECTOMY      as a child   . APPENDECTOMY  as a child   . CARDIAC CATHETERIZATION  2007   . CARDIAC CATHETERIZATION  2014   . CARDIOVERSION      x3 for A Fib , w/TEE   . CATARACT EXTRACTION W/ INTRAOCULAR LENS IMPLANT Right 2001   . CLOSED REDUCTION, INSERTION, HIP IM NAIL (GAMMA) Left 05/20/2016    Procedure: CLOSED REDUCTION, INSERTION, HIP IM NAIL (GAMMA);  Surgeon: Timoteo Ace, MD;  Location: [facility];  Service: Orthopedics;  Laterality: Left;   . COLONOSCOPY      "years ago"   . INSERT / REPLACE / REMOVE PACEMAKER  05/25/2016    PM card dos   . JOINT REPLACEMENT Right 10-15-15     KNEE No Complications   . KNEE SURGERY Left 2002   . TONSILLECTOMY AND ADENOIDECTOMY           Allergies:     Allergies   Allergen Reactions   . Erythromycin Hives   . Gluten Meal Other (See Comments)     Has Celiac disease   . Tikosyn [Dofetilide]          Medications:     No current facility-administered medications for this visit.      Facility-Administered Medications Ordered in Other Visits   Medication Dose Route Frequency Last Rate Last Dose   . [MAR Hold] 0.9%  NaCl infusion   Intravenous PRN       . [MAR Hold] acetaminophen (TYLENOL) tablet 500 mg  500 mg Oral Q6H PRN       . [MAR Hold] carvedilol (COREG) tablet 3.125 mg  3.125 mg Oral Q12H SCH   3.125 mg at 12/09/16 0912   . [MAR Hold] fluticasone furoate (ARNUITY ELLIPTA) 100 MCG/ACT 1 puff  1 puff Inhalation QAM   1 puff at 12/09/16 0912   . [MAR Hold] furosemide (LASIX) injection 20 mg  20 mg Intravenous Daily   20 mg at 12/09/16 0911   . [MAR Hold] gabapentin (NEURONTIN) capsule 100 mg  100 mg Oral Q8H SCH   100 mg at 12/09/16 0536   . [MAR Hold] levothyroxine  (SYNTHROID, LEVOTHROID) tablet 112 mcg  112 mcg Oral Daily at 0600   112 mcg at 12/09/16 0536   . [MAR Hold] naloxone (NARCAN) injection 0.2 mg  0.2 mg Intravenous PRN       . [MAR Hold] pantoprazole (PROTONIX) injection 40 mg  40 mg Intravenous BID   40 mg at 12/09/16 0911   . [MAR Hold] simvastatin (ZOCOR) tablet 10 mg  10 mg Oral QHS   10 mg at 12/08/16 2221              Prior to Admission medications    Medication Sig Start Date End Date Taking? Authorizing Provider   acetaminophen (TYLENOL) 500 MG tablet Take 500 mg by mouth 3 (three) times daily.   Yes [provider]   alendronate (FOSAMAX) 70 MG tablet Take 70 mg by mouth every 7 days.Take 1 tab by mouth every 7 days with a full glass of water on an empty stomach. Do not take anything else by mouth or lie down for 30 mins.   Yes [provider]   Calcium Citrate-Vitamin D (CITRACAL + D PO) Take 2 tablets by mouth daily.   Yes [provider]   CO-ENZYME Q-10 PO Take by mouth daily.   Yes [provider]   dofetilide (TIKOSYN) 250 MCG capsule Take 250 mcg by mouth 2 (two) times daily.  Yes [provider]   fluticasone furoate (ARNUITY ELLIPTA) 100 MCG/ACT Aerosol Powder, Breath Activtivatede Inhale 1 puff into the lungs daily.   Yes [provider]   Magnesium 250 MG Tab Take 250 mg by mouth daily.   Yes [provider]   furosemide (LASIX) 20 MG tablet Take 20 mg by mouth daily.        [provider]   gabapentin (NEURONTIN) 100 MG capsule Take 100 mg by mouth 3 (three) times daily.    [provider]   iron-vitamin C (VITRON-C) 65-125 MG Tab Take 1 tablet by mouth every tues and thurs.    [provider]   levothyroxine (SYNTHROID, LEVOTHROID) 112 MCG tablet Take 112 mcg by mouth Once a day at 6:00am.    [provider]   lisinopril (PRINIVIL,ZESTRIL) 5 MG tablet Take 5 mg by mouth daily.    [provider]   Multiple Vitamin (MULTIVITAMIN) tablet  Take 1 tablet by mouth daily.    [provider]   Probiotic Product (ALIGN) 4 MG Cap Take by mouth.Take as directed        [provider]   simvastatin (ZOCOR) 10 MG tablet Take 10 mg by mouth nightly.    [provider]   warfarin (COUMADIN) 5 MG tablet Take 5 mg by mouth daily.    [provider]     Vitals   Temp:  [35.4 C (95.7 F)-36.8 C (98.3 F)] 36.8 C (98.3 F)  Heart Rate:  [56-97] 82  Resp Rate:  [12-18] 18  BP: (94-119)/(57-75) 114/64    Wt Readings from Last 3 Encounters:   12/09/16 58.9 kg (129 lb 12.8 oz)   12/03/16 61.2 kg (135 lb)   11/11/16 61.2 kg (135 lb)     BMI (Estimated body mass index is 22.28 kg/m as calculated from the following:    Height as of 12/04/16: 1.626 m (5\' 4" ).    Weight as of an earlier encounter on 12/09/16: 58.9 kg (129 lb 12.8 oz).)  Temp Readings from Last 3 Encounters:   12/09/16 36.8 C (98.3 F) (Temporal Artery)   11/12/16 36.5 C (97.7 F) (Axillary)   07/10/16 36.3 C (97.3 F) (Oral)     BP Readings from Last 3 Encounters:   12/09/16 114/64   11/12/16 125/67   08/28/16 115/83     Pulse Readings from Last 3 Encounters:   12/09/16 82   12/03/16 75   11/12/16 80           Labs:   CBC:  Lab Results   Component Value Date    WBC 5.70 12/09/2016    HGB 11.1 (L) 12/09/2016    HCT 35.1 (L) 12/09/2016    PLT 190 12/09/2016       Chemistries:  Lab Results   Component Value Date    NA 141 12/09/2016    K 4.0 12/09/2016    CL 102 12/09/2016    CO2 28 12/09/2016    BUN 13.0 12/09/2016    CREAT 0.9 12/09/2016    GLU 91 12/09/2016    CA 9.4 12/09/2016    AST 24 12/04/2016       Coags:  Lab Results   Component Value Date    PT 13.5 12/04/2016    PTT 25 12/04/2016    INR 1.0 12/04/2016     _____________________      Signed by: Lolita Cram   12/09/16   10:34 AM    =============================================================  Signed by: Lolita Cram  12/09/16 10:34 AM

## 2016-12-10 ENCOUNTER — Encounter: Payer: Self-pay | Admitting: Gastroenterology

## 2016-12-10 LAB — CBC
Absolute NRBC: 0 10*3/uL
Hematocrit: 29.7 % — ABNORMAL LOW (ref 37.0–47.0)
Hgb: 9.2 g/dL — ABNORMAL LOW (ref 12.0–16.0)
MCH: 30.4 pg (ref 28.0–32.0)
MCHC: 31 g/dL — ABNORMAL LOW (ref 32.0–36.0)
MCV: 98 fL (ref 80.0–100.0)
MPV: 10.2 fL (ref 9.4–12.3)
Nucleated RBC: 0 /100 WBC (ref 0.0–1.0)
Platelets: 135 10*3/uL — ABNORMAL LOW (ref 140–400)
RBC: 3.03 10*6/uL — ABNORMAL LOW (ref 4.20–5.40)
RDW: 15 % (ref 12–15)
WBC: 3.72 10*3/uL (ref 3.50–10.80)

## 2016-12-10 LAB — BASIC METABOLIC PANEL
BUN: 15 mg/dL (ref 7.0–19.0)
CO2: 29 mEq/L (ref 21–29)
Calcium: 8.7 mg/dL (ref 7.9–10.2)
Chloride: 104 mEq/L (ref 100–111)
Creatinine: 1 mg/dL (ref 0.4–1.5)
Glucose: 83 mg/dL (ref 70–100)
Potassium: 3.5 mEq/L (ref 3.5–5.1)
Sodium: 141 mEq/L (ref 136–145)

## 2016-12-10 LAB — HEMOLYSIS INDEX: Hemolysis Index: 2 (ref 0–18)

## 2016-12-10 LAB — GFR: EGFR: 53

## 2016-12-10 LAB — MAGNESIUM: Magnesium: 1.9 mg/dL (ref 1.6–2.6)

## 2016-12-10 MED ORDER — FUROSEMIDE 40 MG PO TABS
40.0000 mg | ORAL_TABLET | Freq: Every day | ORAL | Status: DC
Start: 2016-12-10 — End: 2016-12-11
  Administered 2016-12-10 – 2016-12-11 (×2): 40 mg via ORAL
  Filled 2016-12-10 (×2): qty 1

## 2016-12-10 NOTE — Plan of Care (Signed)
Problem: Moderate/High Fall Risk Score >5  Goal: Patient will remain free of falls   12/09/16 2200   OTHER   Moderate Risk (6-13) MOD-Remain with patient during toileting;MOD-Place Fall Risk level on whiteboard in room;MOD-Initiate Yellow "Fall Risk" magnet communication tool       Problem: Altered GI Function  Goal: No bleeding  Outcome: Progressing   12/09/16 1404   Goal/Interventions addressed this shift   No bleeding  Monitor and assess vitals and hemodynamic parameters;Monitor/assess lab values and report abnormal values;Assess for bruising/petechia       Comments: Pt AOx4, VSS, RA, afebrile, calm/cooperative. OOB to bathroom with steady gait. Started diet as ordered, pt tolerating well. Pt swallowed pill cam for Video Capsule Endoscopy. Removed camera at 20.10 after light flashed. No BM so far. Pt resting in bed comfortable. Safety maintained, continue to monitor.

## 2016-12-10 NOTE — Plan of Care (Signed)
Capsule endoscopy was read today:    FINDINGS:  Large, benign lymphangiectasias noted in the distal small bowel.  Otherwise normal small bowel with no evidence of blood or stigmata of recent bleeding.      Findings reviewed with primary team.  No further inpatient work up for GI bleed is planned at this time.  Please call with any further questions.

## 2016-12-10 NOTE — Plan of Care (Signed)
Problem: Moderate/High Fall Risk Score >5  Goal: Patient will remain free of falls  Outcome: Progressing   12/10/16 0853   OTHER   Moderate Risk (6-13) MOD-Remain with patient during toileting;MOD-Consider a move closer to Nurses Station;MOD-Use of chair-pad alarm when appropriate;MOD-Floor mat at bedside (where available) if appropriate       Problem: Altered GI Function  Goal: No bleeding  Outcome: Progressing   12/10/16 1744   Goal/Interventions addressed this shift   No bleeding  Monitor and assess vitals and hemodynamic parameters;Monitor/assess lab values and report abnormal values;Assess for bruising/petechia

## 2016-12-10 NOTE — Plan of Care (Signed)
Problem: Moderate/High Fall Risk Score >5  Goal: Patient will remain free of falls   12/10/16 2300   OTHER   Moderate Risk (6-13) MOD-Initiate Yellow "Fall Risk" magnet communication tool;MOD-Floor mat at bedside (where available) if appropriate;MOD-Remain with patient during toileting;MOD-Place Fall Risk level on whiteboard in room       Problem: Altered GI Function  Goal: No bleeding  Outcome: Progressing   12/10/16 1744   Goal/Interventions addressed this shift   No bleeding  Monitor and assess vitals and hemodynamic parameters;Monitor/assess lab values and report abnormal values;Assess for bruising/petechia       Comments: Pt AOx4, VSS, RA, afebrile, calm/cooperative. OOB to bathroom with steady gait. Pt swallowed pill cam for Video Capsule Endoscopy yesterday. No BM so far. Pt resting in bed comfortable. Safety maintained, no sign of bleeding, continue to monitor.

## 2016-12-10 NOTE — Progress Notes (Addendum)
[place] [facility] PROGRESS NOTE  [facility]      Date Time: 12/10/16 9:54 AM  Patient Name: [name], [name]  Medical Record #:  [id number]  Account#:  [id number]  Admission Date:  12/04/2016         Patient Active Problem List   Diagnosis   . Celiac disease   . Scoliosis   . Atrial fibrillation   . Mitral valve disorder   . Disease of tricuspid valve   . Chronic airway obstruction, not elsewhere classified   . AF (atrial fibrillation)   . Paroxysmal a-fib   . CHF (congestive heart failure)   . Ischemic embolic stroke   . Decreased diffusion capacity   . PFO (patent foramen ovale)   . Acquired hypothyroidism   . Chronic systolic heart failure   . PAH (pulmonary artery hypertension)   . Non-rheumatic mitral regurgitation   . Respiratory failure   . Ischemic stroke   . Altered mental status, unspecified altered mental status type   . Closed pertrochanteric fracture of femur, left, initial encounter   . Torsades de pointes   . ASD (atrial septal defect)   . Gastrointestinal hemorrhage, unspecified gastrointestinal hemorrhage type   . Coronary artery disease involving native heart without angina pectoris, unspecified vessel or lesion type   . Acute blood loss anemia   . Anemia       Subjective:   Denies chest pain or palpitations. Had some dizziness. Has low BP this am  No SOB.  EGD and colonscopy performed without source of bleeding    Weight down to 128lbs  Basically net even  Assessment:    Presented with rectal bleeding and bloody stools x 1 week. Hgb 7.4. Was on Plavix (ASD closure recently) and Xarelto (AF)   S/p ASD closure 11/11/2016   S/p EGD, colonoscopy 6/21 without finding source of bleeding   Chronic systolic CHF, volume status normalizing on iv lasix after recent transfusions.   Nonischemic cardiomyopathy with ejection fraction 25% November 2017, up to 45% October 06, 2016.   Recurrent atrial fibrillation noted on last visit Oct 27, 2016. Status post failure of amiodarone and extensive left and  right atrial focus impulsive rotor modulation ablation with pulmonary vein isolation September 2017   Chronic Xarelto for anticoagulation   Out of hospital cardiac arrest December 2017, on Tikosyn and also with progressive decline in left ventricular function, treated with implantable cardioverter defibrillator implant December 2017.   Left heart cath 03/2016 revealed no significant CAD   Kyphoscoliosis with moderate restriction.   Hypothyroidism   Severe DLCO abnormality followed by Dr. Stacy Gardner.    Hypothyroidism, on Synthroid.    Recommendations:      H/H stable. Now s/p EGD and colonoscopy without source of bleeding. Please resume Xarelto and Plavix when GI allows. She will remain on Plavix until Aug for her recent ASD closure. The other option is to change to change her back to Coumadin   PO Lasix (change toPO today). Daily weights and I/Os.   Continue carvedilol 3.125mg  BID and statin therapy    Medications:      Scheduled Meds:        carvedilol 3.125 mg Oral Q12H SCH   fluticasone furoate 1 puff Inhalation QAM   furosemide 20 mg Intravenous Daily   gabapentin 100 mg Oral Q8H SCH   levothyroxine 112 mcg Oral Daily at 0600   pantoprazole 40 mg Intravenous BID   simvastatin 10 mg Oral QHS  Continuous Infusions:           Physical Exam:       VITAL SIGNS PHYSICAL EXAM   Vitals:    12/10/16 0921   BP: (!) 89/46   Pulse: 82   Resp:    Temp:    SpO2:      Temp (24hrs), Avg:97.2 F (36.2 C), Min:96 F (35.6 C), Max:98.3 F (36.8 C)        Intake/Output Summary (Last 24 hours) at 12/10/16 0954  Last data filed at 12/10/16 0859   Gross per 24 hour   Intake             1060 ml   Output              600 ml   Net              460 ml    Physical Exam  General: awake, alert, breathing comfortably, no acute distress  Head: normocephalic  Eyes: EOM's intact  Cardiovascular: irregularly irregular rate and rhythm, no murmurs, rubs or gallops  Neck: no carotid bruits or JVD  Lungs: clear to auscultation  bilateraly, without wheezing, rhonchi, or rales  Abdomen: soft, non-tender, non-distended; no palpable masses,  normoactive bowel sounds  Extremities: no edema  Pulse: equal pulses, 4/4 symmetric  Neurological: Alert and oriented X3, mood and affect normal  Musculoskeletal: normal strength and tone         Labs:                 Recent Labs  Lab 12/04/16  1850   Bilirubin, Total 0.6   Protein, Total 6.1   Albumin 3.7   ALT 12   AST (SGOT) 24       Recent Labs  Lab 12/10/16  0329   Magnesium 1.9       Recent Labs  Lab 12/04/16  1850   PT 13.5   PT INR 1.0   PTT 25       Recent Labs  Lab 12/10/16  0329 12/09/16  0543 12/08/16  0332   WBC 3.72 5.70 3.96   Hgb 9.2* 11.1* 8.9*   Hematocrit 29.7* 35.1* 27.8*   Platelets 135* 190 129*       Recent Labs  Lab 12/10/16  0329 12/09/16  0543 12/08/16  0332   Sodium 141 141 141   Potassium 3.5 4.0 3.9   Chloride 104 102 109   CO2 29 28 24    BUN 15.0 13.0 18.0   Creatinine 1.0 0.9 0.8   EGFR 53.0 59.8 >60.0   Glucose 83 91 82   Calcium 8.7 9.4 8.3           Invalid input(s): FREET4    .No results found for: BNP     Weight Monitoring 12/04/2016 12/06/2016 12/06/2016 12/07/2016 12/08/2016 12/09/2016 12/10/2016   Height 162.6 cm - - - - - -   Height Method Stated - - - - - -   Weight 61.236 kg 59.966 kg 60.192 kg 59.784 kg 59.058 kg 58.877 kg 58.287 kg   Weight Method Stated Standing Scale Standing Scale - Standing Scale Standing Scale Standing Scale   BMI (calculated) 23.2 kg/m2 - - - - - -       Imaging:       ____________________________________________    Signed by: Dreama Saa, PA    I saw and examined the patient. My exam and finding duplicated as documented in the note above  on Anayely Y Mcauliffe.  I have personally edited and added to the text of this note to reflect my exam and medical assessment and plan.  I have personally reviewed the patient's history and 24 hour interval events, along with vitals, labs, radiology images, and additional findings found in detail with in the note  above.  I concur with the above documented assessment and care plan which was developed with and reviewed by me.    Jonetta Speak MD      [facility]  NP Spectralink [phone] (8am-5pm)  MD Spectralink [phone] or [phone] (8am-5pm)  Arrhythmia Spectralink [phone] (8am-4:30pm)  After hours, non urgent consult line [phone]  After Hours, urgent consults [phone]

## 2016-12-10 NOTE — Malnutrition Assessment (Signed)
NUTRITION:  Reason for Assessment: Screen     Recommend:  Gluten free diet - encourage po intake with meals  Monitor med tx plan and d/c planning  Continue Ensure Enlive, add Magic Cup to try (both gluten free supplements)    Clinical Update:  [name] is a(n) 83 y.o. female with GIB (melena and blood clots) s/p EGD/scope 6/20, anemia, s/p recent ASD closure, NICM, afib s/p ablation, hx cardiac arrest 12/17, HLD, hypothyroidism, kyphoscoliosis, celiac disease     Labs: reviewed  Meds: lasix, protonix     Anthropometrics:  Height: 162.6 cm (5\' 4" )  Weight: 58.3 kg (128 lb 8 oz)  Body mass index is 22.06 kg/m.    Diet / Nutrition Support Order: Gluten restricted  Per patient, hx celiac disease for several years, has been following gluten free diet for a while, oats okay. Avoids wheat, barley, rye. Drank Ensure today and tolerated well (gluten free). Used to weigh around 138#, but has since dropped down to 128# - pt feels this may be due to various procedures she has had where she has to be NPO (including inpatient and outpatient procedures), also is limited with food selections 2/2 celiac disease, and on diuresis. Willing to continue with Ensure Enlive and wants to try Magic Cup if patient to remain hospitalized. Hopes to be discharged today or tomorrow.     Nutrition Goals: 1600-1800 kcals, 65-75 gm protein     Nutrition Diagnosis:   Moderate protein calorie malnutrition related to GIB, multiple comorbidities, hx celiac disease a/e/b inadequate po intake 2/2 prolonged NPO orders, fluid accumulation potentially masking weight loss, BMI 22 in elderly patient, limited diet selection 2/2 gluten free diet    Monitor/Eval:  Monitor nutr support goals, labs, GI, med tx plan    Georgina Pillion, RD

## 2016-12-10 NOTE — Progress Notes (Signed)
MEDICINE PROGRESS NOTE    Date Time: 12/10/16 3:46 PM  Patient Name: [name]  Attending Physician: Vernell Barrier, MD    Assessment:   Principal Problem:    Anemia  Active Problems:    Gastrointestinal hemorrhage, unspecified gastrointestinal hemorrhage type    Coronary artery disease involving native heart without angina pectoris, unspecified vessel or lesion type    Acute blood loss anemia  Resolved Problems:    * No resolved hospital problems. *    This is a 88 y/p female with h/o ASD closure 11/11/2016, nonischemic CM, afib s/p ablation, h/o cardiac arrest, admitted with GIB in the setting of Plavix      Plan:   1. GIB: appreciate GI input, s/p EGD and colonoscopy yesterday, no clear source of bleeding found, capsule studies done which showed benign lymphangiectasias noted in the distal small bowel, otherwise normal small bowel per GI, awaiting for final rec on anticoagulation Kathryne Hitch and Plavix)  2. S/p ASD closure/NICM: appreciate card input, continue lasix, carvedilol, holding Plavix and Xarelto for now. Awaiting for GI rec  3. Hypothyroidism: synthroid  4. HLD: zocor  5. FEN: tolerating PO diet, replete electrolytes PRN  6. Dispo: pending clinical course  Case discussed with: pt, nurse, GI    Safety Checklist:     DVT prophylaxis:  CHEST guideline (See page [id number]) Chemical and / or mechanical ppx NOT indicated or contraindicated: Active Bleeding    Foley:  [place] Rn Foley protocol Not present   IVs:  Peripheral IV   PT/OT: Not needed   Daily CBC & or Chem ordered:  SHM/ABIM guidelines (see #5) Yes, due to clinical and lab instability   Reference for approximate charges of common labs: CBC auto diff - $76  BMP - $99  Mg - $79    Lines:     Patient Lines/Drains/Airways Status    Active PICC Line / CVC Line / PIV Line / Drain / Airway / Intraosseous Line / Epidural Line / ART Line / Line / Wound / Pressure Ulcer / NG/OG Tube     Name:   Placement date:   Placement time:   Site:   Days:    Peripheral  IV 12/05/16 Right Forearm  12/05/16    2035    Forearm    4                 Disposition: (Please see PAF column for Expected D/C Date)   Today's date: 12/10/2016  Admit Date: 12/04/2016  6:36 PM  LOS: 6  Clinical Milestones:   Anticipated discharge needs:       Subjective     CC: Anemia    Interval History/24 hour events: no acute events    HPI/Subjective: pt seen and examined, no active issues overnight, pt offers no new complaints    Review of Systems:     As per HPI    Physical Exam:     VITAL SIGNS PHYSICAL EXAM   Temp:  [96 F (35.6 C)-97.7 F (36.5 C)] 97.7 F (36.5 C)  Heart Rate:  [61-88] 83  Resp Rate:  [14-18] 14  BP: (89-112)/(46-71) 111/71  Blood Glucose:    Telemetry:       Intake/Output Summary (Last 24 hours) at 12/10/16 1546  Last data filed at 12/10/16 1013   Gross per 24 hour   Intake              660 ml   Output  800 ml   Net             -140 ml    Physical Exam  General: awake, alert X oriented  Cardiovascular: regular rate and rhythm, no murmurs, rubs or gallops  Lungs: clear to auscultation bilaterally, without wheezing, rhonchi, or rales  Abdomen: soft, non-tender, non-distended; no palpable masses,  normoactive bowel sounds  Extremities: no edema  Other:        Meds:     Medications were reviewed:  Current Facility-Administered Medications   Medication Dose Route Frequency   . carvedilol  3.125 mg Oral Q12H SCH   . fluticasone furoate  1 puff Inhalation QAM   . furosemide  40 mg Oral Daily   . gabapentin  100 mg Oral Q8H SCH   . levothyroxine  112 mcg Oral Daily at 0600   . pantoprazole  40 mg Intravenous BID   . simvastatin  10 mg Oral QHS     Current Facility-Administered Medications   Medication Dose Route Frequency Last Rate     Current Facility-Administered Medications   Medication Dose Route   . sodium chloride   Intravenous   . acetaminophen  500 mg Oral   . naloxone  0.2 mg Intravenous         Labs:     Labs (last 72 hours):      Recent Labs  Lab 12/10/16  0329  12/09/16  0543   WBC 3.72 5.70   Hgb 9.2* 11.1*   Hematocrit 29.7* 35.1*   Platelets 135* 190         Recent Labs  Lab 12/04/16  1850   PT 13.5   PT INR 1.0   PTT 25      Recent Labs  Lab 12/10/16  0329 12/09/16  0543  12/04/16  1850   Sodium 141 141 More results in Results Review 138   Potassium 3.5 4.0 More results in Results Review 4.4   Chloride 104 102 More results in Results Review 105   CO2 29 28 More results in Results Review 22   BUN 15.0 13.0 More results in Results Review 23.0*   Creatinine 1.0 0.9 More results in Results Review 0.9   Calcium 8.7 9.4 More results in Results Review 9.0   Albumin  --   --   --  3.7   Protein, Total  --   --   --  6.1   Bilirubin, Total  --   --   --  0.6   Alkaline Phosphatase  --   --   --  113*   ALT  --   --   --  12   AST (SGOT)  --   --   --  24   Glucose 83 91 More results in Results Review 102*   More results in Results Review = values in this interval not displayed.                Microbiology, reviewed and are significant for:  Microbiology Results     None          Imaging, reviewed and are significant for:      Signed by: Lonia Farber, PA

## 2016-12-11 DIAGNOSIS — D500 Iron deficiency anemia secondary to blood loss (chronic): Secondary | ICD-10-CM

## 2016-12-11 LAB — BASIC METABOLIC PANEL
BUN: 23 mg/dL — ABNORMAL HIGH (ref 7.0–19.0)
CO2: 29 mEq/L (ref 21–29)
Calcium: 8.5 mg/dL (ref 7.9–10.2)
Chloride: 103 mEq/L (ref 100–111)
Creatinine: 0.9 mg/dL (ref 0.4–1.5)
Glucose: 86 mg/dL (ref 70–100)
Potassium: 3.9 mEq/L (ref 3.5–5.1)
Sodium: 140 mEq/L (ref 136–145)

## 2016-12-11 LAB — CBC
Absolute NRBC: 0 10*3/uL
Hematocrit: 28 % — ABNORMAL LOW (ref 37.0–47.0)
Hgb: 8.8 g/dL — ABNORMAL LOW (ref 12.0–16.0)
MCH: 30.4 pg (ref 28.0–32.0)
MCHC: 31.4 g/dL — ABNORMAL LOW (ref 32.0–36.0)
MCV: 96.9 fL (ref 80.0–100.0)
MPV: 10 fL (ref 9.4–12.3)
Nucleated RBC: 0 /100 WBC (ref 0.0–1.0)
Platelets: 130 10*3/uL — ABNORMAL LOW (ref 140–400)
RBC: 2.89 10*6/uL — ABNORMAL LOW (ref 4.20–5.40)
RDW: 15 % (ref 12–15)
WBC: 3.89 10*3/uL (ref 3.50–10.80)

## 2016-12-11 LAB — HEMOLYSIS INDEX: Hemolysis Index: 5 (ref 0–18)

## 2016-12-11 LAB — GFR: EGFR: 59.8

## 2016-12-11 LAB — MAGNESIUM: Magnesium: 1.8 mg/dL (ref 1.6–2.6)

## 2016-12-11 NOTE — Plan of Care (Signed)
Spoke with GI and as EGD, colonoscopy or capsule endoscopy shows source of bleeding, then OK to retart [facility] today. As discussed with the hospitalist, she will be restarted today on Xarelto and Plavix (the latter to be on until Aug for the ASD closure). We will arrange followup in our office in 1 -2 weeks

## 2016-12-11 NOTE — Progress Notes (Signed)
Patient stable, d/c to home as ordered. Discharged instructions reviewed and she verbalized understanding and no complain.

## 2016-12-11 NOTE — Discharge Instr - AVS First Page (Signed)
Reason for your Hospital Admission:  GI bleeding and anemia      Instructions for after your discharge:  You need to have a Complete Blood Count once a week for three weeks

## 2016-12-11 NOTE — Plan of Care (Signed)
Problem: Moderate/High Fall Risk Score >5  Goal: Patient will remain free of falls  Outcome: Adequate for Discharge   12/11/16 0930   OTHER   Moderate Risk (6-13) MOD-Initiate Yellow "Fall Risk" magnet communication tool;MOD-Consider activation of bed alarm if appropriate;MOD-Floor mat at bedside (where available) if appropriate;MOD-Use of chair-pad alarm when appropriate;MOD-Remain with patient during toileting;MOD-Use of assistive devices-bedside commode if appropriate       Problem: Altered GI Function  Goal: No bleeding  Outcome: Completed Date Met: 12/11/16   12/10/16 1744   Goal/Interventions addressed this shift   No bleeding  Monitor and assess vitals and hemodynamic parameters;Monitor/assess lab values and report abnormal values;Assess for bruising/petechia   Patient H/H stable . No GIB noted.  Vital sign stable, she denies any GI discomfort.  Will continue plan of care.

## 2016-12-15 NOTE — Discharge Summary (Signed)
Discharge Summary    Date:12/15/2016   Patient Name: [name], [name]  Attending Physician: No att. providers found    Date of Admission:   12/04/2016    Date of Discharge:   12/11/2016    Admitting Diagnosis:   Melena    Discharge Dx:     Principal Diagnosis (Diagnosis after study, that is chiefly responsible for admission to inpatient status):   Anemia 2/2 GI bleeding      Treatment Team:   Treatment Team:   Consulting Physician: Graciella Belton, MD     Procedures performed:   Radiology: all results from this admission  Xr Chest  Ap Portable    Result Date: 12/04/2016   Cardiomegaly, and pacing wires as described. No obvious congestive heart failure or focal airspace consolidation. Colonel Bald, MD 12/04/2016 8:03 PM    Surgery: all results from this admission  Procedure(s):  EGD, COLONOSCOPY (N/A)    Reason for Admission:   Anemia  Hospital Course:   [name] is an 83 year old female with past history of nonischemic cardiomyopathy, atrial fibrillation post ablation, and history of cardiac arrest.  She has been on Xarelto for atrial fibrillation and Plavix was added following the transcutaneous closures often atrial septal defect on Nov 11, 2016.  She presented with anemia which required blood transfusion.  EGD and colonoscopy were unremarkable.  A video camera study off the small intestines showed ileal lymphangiectasia.  No bleedings.  Source was identified.  The patient did not have evidence of active bleeding and hospital and she was seen by cardiology who recommended restarting Xarelto and Plavix until August.     Condiition at Discharge:   Good  Today:     BP 103/64   Pulse 81   Temp 97.9 F (36.6 C) (Oral)   Resp 16   Ht 1.626 m (5\' 4" )   Wt 58.4 kg (128 lb 12.8 oz)   SpO2 95%   BMI 22.11 kg/m   Ranges for the last 24 hours:       Last set of labs     Recent Labs  Lab 12/11/16  0343   WBC 3.89   Hgb 8.8*   Hematocrit 28.0*   Platelets 130*       Recent Labs  Lab 12/11/16  0343   Sodium 140    Potassium 3.9   Chloride 103   CO2 29   BUN 23.0*   Creatinine 0.9   EGFR 59.8   Glucose 86   Calcium 8.5           Micro / Labs / Path pending:     Unresulted Labs     None          Discharge Instructions For Providers     1. Patient was discharged on ferrous sulfate by mouth.  She will need to have her complete blood count checked once a week for the next few weeks at least.          Discharge Instructions:     Follow-up Information     Lestine Mount, MD Follow up in 2 week(s).    Specialty:  Gastroenterology  Contact information:  [address]  [place] [address]  [phone]             Danford Bad, MD Follow up in 1 week(s).    Contact information:  [address]  [place] [address]  [phone]  Discharge Diet: Cardiac    Disposition:  Home or Self Care     Discharge Medication List      Taking    acetaminophen 500 MG tablet  Dose:  500 mg  Commonly known as:  TYLENOL  Take 1 tablet (500 mg total) by mouth every 6 (six) hours as needed for Pain.     alendronate 70 MG tablet  Dose:  70 mg  Commonly known as:  FOSAMAX  Take 70 mg by mouth every 7 days.Take 1 tab by mouth every 7 days with a full glass of water on an empty stomach. Do not take anything else by mouth or lie down for 30 mins.     ALIGN 4 MG Caps  Take by mouth.Take as directed     ARNUITY ELLIPTA 100 MCG/ACT Aepb  Dose:  1 puff  Generic drug:  fluticasone furoate  Inhale 1 puff into the lungs as needed.     carvedilol 12.5 MG tablet  Dose:  12.5 mg  Commonly known as:  COREG  Take 1 tablet (12.5 mg total) by mouth every 12 (twelve) hours.     CITRACAL + D PO  Dose:  2 tablet  Take 2 tablets by mouth daily.     clopidogrel 75 mg tablet  Dose:  75 mg  Commonly known as:  PLAVIX  Take 1 tablet (75 mg total) by mouth daily.     CO-ENZYME Q-10 PO  Take by mouth daily.     furosemide 20 MG tablet  Dose:  40 mg  Commonly known as:  LASIX  Take 40 mg by mouth daily.     gabapentin 100 MG capsule  Dose:  100 mg  Commonly known  as:  NEURONTIN  Take 100 mg by mouth 3 (three) times daily.     levothyroxine 112 MCG tablet  Dose:  112 mcg  Commonly known as:  SYNTHROID, LEVOTHROID  Take 112 mcg by mouth Once a day at 6:00am.     Magnesium 250 MG Tabs  Dose:  250 mg  Take 250 mg by mouth daily.     multivitamin tablet  Dose:  1 tablet  Take 1 tablet by mouth daily.     potassium chloride 10 MEQ tablet  Commonly known as:  K-TAB, KLOR-CON  TAKE TID BY MOUTH EVERY DAY     simvastatin 10 MG tablet  Dose:  10 mg  Commonly known as:  ZOCOR  Take 10 mg by mouth nightly.     VITRON-C 65-125 MG Tabs  Dose:  1 tablet  Generic drug:  iron-vitamin C  Take 1 tablet by mouth every tues and thurs.     XARELTO 20 MG Tabs  Generic drug:  rivaroxaban  TAKE 20 MG BY MOUTH IN THE EVENING FOR DVT PROPHYLAXIS          Minutes spent coordinating discharge and reviewing discharge plan: 45 minutes      Signed by: Vernell Barrier, MD     CC: Elmon Else, MD   Mahmood Abedi,MD   Particia Jasper, MD

## 2016-12-16 ENCOUNTER — Ambulatory Visit (INDEPENDENT_AMBULATORY_CARE_PROVIDER_SITE_OTHER): Payer: Self-pay | Admitting: Cardiology

## 2017-01-27 ENCOUNTER — Ambulatory Visit (INDEPENDENT_AMBULATORY_CARE_PROVIDER_SITE_OTHER): Payer: Self-pay | Admitting: Cardiology

## 2017-03-04 ENCOUNTER — Ambulatory Visit (INDEPENDENT_AMBULATORY_CARE_PROVIDER_SITE_OTHER): Payer: Self-pay | Admitting: Adult Health

## 2017-03-29 ENCOUNTER — Ambulatory Visit (INDEPENDENT_AMBULATORY_CARE_PROVIDER_SITE_OTHER): Payer: Self-pay | Admitting: Nurse Practitioner

## 2017-04-05 ENCOUNTER — Inpatient Hospital Stay
Admission: EM | Admit: 2017-04-05 | Discharge: 2017-04-09 | DRG: 918 | Disposition: A | Discharge status: Home / Self Care | Payer: Medicare Other | Attending: Internal Medicine | Admitting: Internal Medicine

## 2017-04-05 DIAGNOSIS — E039 Hypothyroidism, unspecified: Secondary | ICD-10-CM | POA: Diagnosis present

## 2017-04-05 DIAGNOSIS — Z23 Encounter for immunization: Secondary | ICD-10-CM

## 2017-04-05 DIAGNOSIS — D696 Thrombocytopenia, unspecified: Secondary | ICD-10-CM | POA: Diagnosis present

## 2017-04-05 DIAGNOSIS — E785 Hyperlipidemia, unspecified: Secondary | ICD-10-CM | POA: Diagnosis present

## 2017-04-05 DIAGNOSIS — Q211 Atrial septal defect, unspecified: Secondary | ICD-10-CM

## 2017-04-05 DIAGNOSIS — M810 Age-related osteoporosis without current pathological fracture: Secondary | ICD-10-CM | POA: Diagnosis present

## 2017-04-05 DIAGNOSIS — Z8674 Personal history of sudden cardiac arrest: Secondary | ICD-10-CM

## 2017-04-05 DIAGNOSIS — I4891 Unspecified atrial fibrillation: Secondary | ICD-10-CM

## 2017-04-05 DIAGNOSIS — I959 Hypotension, unspecified: Secondary | ICD-10-CM | POA: Diagnosis present

## 2017-04-05 DIAGNOSIS — T501X1A Poisoning by loop [high-ceiling] diuretics, accidental (unintentional), initial encounter: Principal | ICD-10-CM | POA: Diagnosis present

## 2017-04-05 DIAGNOSIS — I5022 Chronic systolic (congestive) heart failure: Secondary | ICD-10-CM | POA: Diagnosis present

## 2017-04-05 DIAGNOSIS — I481 Persistent atrial fibrillation: Secondary | ICD-10-CM | POA: Diagnosis present

## 2017-04-05 DIAGNOSIS — Z95810 Presence of automatic (implantable) cardiac defibrillator: Secondary | ICD-10-CM

## 2017-04-05 DIAGNOSIS — M419 Scoliosis, unspecified: Secondary | ICD-10-CM | POA: Diagnosis present

## 2017-04-05 DIAGNOSIS — I110 Hypertensive heart disease with heart failure: Secondary | ICD-10-CM | POA: Diagnosis present

## 2017-04-05 DIAGNOSIS — J449 Chronic obstructive pulmonary disease, unspecified: Secondary | ICD-10-CM | POA: Diagnosis present

## 2017-04-05 DIAGNOSIS — I472 Ventricular tachycardia, unspecified: Secondary | ICD-10-CM | POA: Diagnosis present

## 2017-04-05 DIAGNOSIS — I5023 Acute on chronic systolic (congestive) heart failure: Secondary | ICD-10-CM

## 2017-04-05 DIAGNOSIS — I5021 Acute systolic (congestive) heart failure: Secondary | ICD-10-CM

## 2017-04-05 DIAGNOSIS — E861 Hypovolemia: Secondary | ICD-10-CM | POA: Diagnosis present

## 2017-04-05 DIAGNOSIS — I429 Cardiomyopathy, unspecified: Secondary | ICD-10-CM | POA: Diagnosis present

## 2017-04-05 DIAGNOSIS — E876 Hypokalemia: Secondary | ICD-10-CM | POA: Diagnosis present

## 2017-04-05 DIAGNOSIS — I2721 Secondary pulmonary arterial hypertension: Secondary | ICD-10-CM | POA: Diagnosis present

## 2017-04-05 DIAGNOSIS — I480 Paroxysmal atrial fibrillation: Secondary | ICD-10-CM

## 2017-04-05 LAB — COMPREHENSIVE METABOLIC PANEL
ALT: 27 U/L (ref 0–55)
AST (SGOT): 34 U/L (ref 5–34)
Albumin/Globulin Ratio: 1.6 (ref 0.9–2.2)
Albumin: 4 g/dL (ref 3.5–5.0)
Alkaline Phosphatase: 83 U/L (ref 37–106)
BUN: 25 mg/dL — ABNORMAL HIGH (ref 7.0–19.0)
Bilirubin, Total: 1.2 mg/dL (ref 0.2–1.2)
CO2: 30 mEq/L — ABNORMAL HIGH (ref 22–29)
Calcium: 9.3 mg/dL (ref 7.9–10.2)
Chloride: 99 mEq/L — ABNORMAL LOW (ref 100–111)
Creatinine: 1 mg/dL (ref 0.6–1.0)
Globulin: 2.5 g/dL (ref 2.0–3.6)
Glucose: 90 mg/dL (ref 70–100)
Potassium: 3.4 mEq/L — ABNORMAL LOW (ref 3.5–5.1)
Protein, Total: 6.5 g/dL (ref 6.0–8.3)
Sodium: 141 mEq/L (ref 136–145)

## 2017-04-05 LAB — CBC AND DIFFERENTIAL
Absolute NRBC: 0 10*3/uL
Basophils Absolute Automated: 0.02 10*3/uL (ref 0.00–0.20)
Basophils Automated: 0.5 %
Eosinophils Absolute Automated: 0.08 10*3/uL (ref 0.00–0.70)
Eosinophils Automated: 1.9 %
Hematocrit: 39.6 % (ref 37.0–47.0)
Hgb: 12.4 g/dL (ref 12.0–16.0)
Immature Granulocytes Absolute: 0.01 10*3/uL
Immature Granulocytes: 0.2 %
Lymphocytes Absolute Automated: 1.11 10*3/uL (ref 0.50–4.40)
Lymphocytes Automated: 26.4 %
MCH: 29.4 pg (ref 28.0–32.0)
MCHC: 31.3 g/dL — ABNORMAL LOW (ref 32.0–36.0)
MCV: 93.8 fL (ref 80.0–100.0)
MPV: 10.9 fL (ref 9.4–12.3)
Monocytes Absolute Automated: 0.34 10*3/uL (ref 0.00–1.20)
Monocytes: 8.1 %
Neutrophils Absolute: 2.64 10*3/uL (ref 1.80–8.10)
Neutrophils: 62.9 %
Nucleated RBC: 0 /100 WBC (ref 0.0–1.0)
Platelets: 138 10*3/uL — ABNORMAL LOW (ref 140–400)
RBC: 4.22 10*6/uL (ref 4.20–5.40)
RDW: 18 % — ABNORMAL HIGH (ref 12–15)
WBC: 4.2 10*3/uL (ref 3.50–10.80)

## 2017-04-05 LAB — ECG 12-LEAD
Atrial Rate: 86 {beats}/min
Q-T Interval: 416 ms
QRS Duration: 134 ms
QTC Calculation (Bezet): 495 ms
R Axis: -59 degrees
T Axis: 103 degrees
Ventricular Rate: 85 {beats}/min

## 2017-04-05 LAB — PT AND APTT
PT INR: 1.5 — ABNORMAL HIGH (ref 0.9–1.1)
PT: 18.3 s — ABNORMAL HIGH (ref 12.6–15.0)
PTT: 35 s (ref 23–37)

## 2017-04-05 LAB — PHOSPHORUS: Phosphorus: 4 mg/dL (ref 2.3–4.7)

## 2017-04-05 LAB — GFR: EGFR: 52.9

## 2017-04-05 LAB — MAGNESIUM: Magnesium: 2.3 mg/dL (ref 1.6–2.6)

## 2017-04-05 MED ORDER — RIVAROXABAN 20 MG PO TABS
20.0000 mg | ORAL_TABLET | Freq: Every day | ORAL | Status: DC
Start: 2017-04-05 — End: 2017-04-06
  Administered 2017-04-05: 20 mg via ORAL
  Filled 2017-04-05 (×3): qty 1

## 2017-04-05 MED ORDER — SODIUM CHLORIDE 0.9 % IJ SOLN
3.0000 mL | Freq: Three times a day (TID) | INTRAMUSCULAR | Status: DC
Start: 2017-04-05 — End: 2017-04-07
  Administered 2017-04-05 – 2017-04-06 (×2): 3 mL

## 2017-04-05 MED ORDER — LEVOTHYROXINE SODIUM 112 MCG PO TABS
112.0000 ug | ORAL_TABLET | Freq: Every day | ORAL | Status: DC
Start: 2017-04-06 — End: 2017-04-09
  Administered 2017-04-06 – 2017-04-09 (×4): 112 ug via ORAL
  Filled 2017-04-05 (×6): qty 1

## 2017-04-05 MED ORDER — RISAQUAD PO CAPS
1.0000 | ORAL_CAPSULE | Freq: Every day | ORAL | Status: DC
Start: 2017-04-06 — End: 2017-04-09
  Administered 2017-04-06 – 2017-04-09 (×4): 1 via ORAL
  Filled 2017-04-05 (×4): qty 1

## 2017-04-05 MED ORDER — FERROUS SULFATE 324 (65 FE) MG PO TBEC
DELAYED_RELEASE_TABLET | Freq: Every day | ORAL | Status: DC
Start: 2017-04-06 — End: 2017-04-09
  Filled 2017-04-05 (×8): qty 1

## 2017-04-05 MED ORDER — POTASSIUM CHLORIDE 20 MEQ PO PACK
20.0000 meq | PACK | Freq: Every day | ORAL | Status: DC
Start: 2017-04-06 — End: 2017-04-08
  Administered 2017-04-06 – 2017-04-08 (×3): 20 meq via ORAL
  Filled 2017-04-05 (×3): qty 1

## 2017-04-05 MED ORDER — GABAPENTIN 100 MG PO CAPS
100.0000 mg | ORAL_CAPSULE | Freq: Three times a day (TID) | ORAL | Status: DC
Start: 2017-04-05 — End: 2017-04-09
  Administered 2017-04-05 – 2017-04-09 (×11): 100 mg via ORAL
  Filled 2017-04-05 (×11): qty 1

## 2017-04-05 MED ORDER — INFLUENZA VAC SPLIT HIGH-DOSE 0.5 ML IM SUSY
0.5000 mL | PREFILLED_SYRINGE | Freq: Once | INTRAMUSCULAR | Status: AC
Start: 2017-04-05 — End: 2017-04-06
  Administered 2017-04-06: 0.5 mL via INTRAMUSCULAR
  Filled 2017-04-05: qty 0.5

## 2017-04-05 MED ORDER — CARVEDILOL 6.25 MG PO TABS
12.5000 mg | ORAL_TABLET | Freq: Two times a day (BID) | ORAL | Status: DC
Start: 2017-04-05 — End: 2017-04-07
  Administered 2017-04-05 – 2017-04-06 (×2): 12.5 mg via ORAL
  Filled 2017-04-05 (×4): qty 2

## 2017-04-05 MED ORDER — PATIENT SUPPLIED NON FORMULARY
250.0000 mg | Freq: Every day | Status: DC
Start: 2017-04-06 — End: 2017-04-06

## 2017-04-05 MED ORDER — POTASSIUM CHLORIDE CRYS ER 20 MEQ PO TBCR
40.0000 meq | EXTENDED_RELEASE_TABLET | Freq: Once | ORAL | Status: AC
Start: 2017-04-05 — End: 2017-04-05
  Administered 2017-04-05: 40 meq via ORAL
  Filled 2017-04-05: qty 2

## 2017-04-05 MED ORDER — NALOXONE HCL 0.4 MG/ML IJ SOLN (WRAP)
0.2000 mg | INTRAMUSCULAR | Status: DC | PRN
Start: 2017-04-05 — End: 2017-04-09

## 2017-04-05 MED ORDER — FLUTICASONE FUROATE 100 MCG/ACT IN AEPB
1.0000 | INHALATION_SPRAY | Freq: Every morning | RESPIRATORY_TRACT | Status: DC
Start: 2017-04-06 — End: 2017-04-09
  Administered 2017-04-07 – 2017-04-09 (×3): 1 via RESPIRATORY_TRACT
  Filled 2017-04-05: qty 14

## 2017-04-05 MED ORDER — CALCIUM CITRATE-VITAMIN D 315-250 MG-UNIT PO TABS
1.0000 | ORAL_TABLET | Freq: Every day | ORAL | Status: DC
Start: 2017-04-06 — End: 2017-04-09
  Administered 2017-04-06 – 2017-04-09 (×4): 1 via ORAL
  Filled 2017-04-05 (×4): qty 1

## 2017-04-05 MED ORDER — ACETAMINOPHEN 500 MG PO TABS
500.0000 mg | ORAL_TABLET | Freq: Four times a day (QID) | ORAL | Status: DC | PRN
Start: 2017-04-05 — End: 2017-04-09

## 2017-04-05 MED ORDER — TAB-A-VITE/BETA CAROTENE PO TABS
1.0000 | ORAL_TABLET | Freq: Every day | ORAL | Status: DC
Start: 2017-04-06 — End: 2017-04-09
  Administered 2017-04-06 – 2017-04-09 (×4): 1 via ORAL
  Filled 2017-04-05 (×4): qty 1

## 2017-04-05 MED ORDER — SIMVASTATIN 10 MG PO TABS
10.0000 mg | ORAL_TABLET | Freq: Every evening | ORAL | Status: DC
Start: 2017-04-05 — End: 2017-04-09
  Administered 2017-04-05 – 2017-04-08 (×4): 10 mg via ORAL
  Filled 2017-04-05 (×4): qty 1

## 2017-04-05 NOTE — ED Triage Notes (Signed)
See quick triage note - Pt reports she had been mistakenly taking incorrect dose of Lasix, has been taking 3.5 additional tablets per day. Pt reports 8 lb weight loss over past week & SBP ~ 97 @ home.

## 2017-04-05 NOTE — ED Provider Notes (Signed)
[name] EMERGENCY DEPARTMENT H&P      Visit date: 04/05/2017      CLINICAL SUMMARY           Diagnosis:    .     Final diagnoses:   Ventricular tachycardia         MDM Notes:      Patient with V tach on overnight monitoring/icd, currently no symptoms. Admitted for further care and work up. [facility] to see in consultation.         Disposition:        Admit Inpatient     ED Disposition     ED Disposition Condition Date/Time Comment    Admit  Mon Apr 05, 2017  8:03 PM Admitting Physician: Verdene Lennert [[id number]]   Diagnosis: Ventricular tachycardia [207245]   Estimated Length of Stay: > or = to 2 midnights   Tentative Discharge Plan?: Home or Self Care [1]   Patient Class: Inpatient [101]                        CLINICAL INFORMATION        HPI:      Chief Complaint: Abnormal ECG  .    [name] is a 83 y.o. female w/ PMHx including HTN, HLD, COPD, arrhythmia, with defibrillator, ?pacemaker, AVD; who presents after her monitor at [facility] showed V tach last night. She has mistakenly been taking incorrect dose of lasix after which she was put on monitor for observation at [facility].      History obtained from: patient, review of prior chart          ROS:      Positive and negative ROS elements as per HPI.  All other systems reviewed and negative.      Physical Exam:      Pulse 93  BP 105/65  Resp 18  SpO2 90 %  Temp 98.4 F (36.9 C)    Physical Exam   Constitutional: Well-developed, well-nourished, and in no distress. Vital signs are noted.   HEENT:        Head: Atraumatic.        Nose: No epistaxis.        Ears:       Mouth/Throat: Oropharynx is clear. Mucous membranes are moist.        Eyes: Conjunctivae normal, EOM and lids are normal. Pupils are equal, round, and reactive to light.   Cardiovascular: Normal rate. Regular rhythm.  Exam reveals no gallop.    No rubs. Systolic murmur.  Pulmonary/Chest: No accessory muscle use. No rales. No wheezing. No decreased BS.   Abdominal:  There are no masses. There is no tenderness. There is no rebound.   Musculoskeletal:        Cervical Neck: Normal ROM. Supple. No TTP.                     Thoracic back: Pt exhibits no tenderness.        Lumbar back: Pt exhibits no tenderness.        Arms: No edema, no apparent injury       Legs: No edema or TTP.   Neurological: Pt is alert and oriented to person, place, and time. Pt has normal motor function. No sensory deficits.   Skin: Skin is warm and dry. No pallor. No rash.   Psych: No SI/HI/Hallucinations. Normal judgement and affect.  PAST HISTORY        Primary Care Provider: Danford Bad, MD        PMH/PSH:    .     Past Medical History:   Diagnosis Date   . Abnormal vision     Dry eyes; marginal vision L eye   . Abnormal vision     Wears glasses   . Anemia     controlled on meds   . Aortic valve insufficiency    . Arrhythmia     Atrial fibrillation   . Arthritis     osteoarthritis, back and pelvis   . AVD (aortic valve disease)    . Benign heart murmur    . Bilateral cataracts     surgery, right eye   . Celiac disease    . Chronic obstructive pulmonary disease     mild-mod   . Difficulty walking     Back pain; Uses cane   . Disease of lung    . Disorder of musculoskeletal system ?    ?   Marland Kitchen Dyspnea on exertion    . Ear, nose and throat disorder     Hoarseness   . Encounter for cardioversion procedure 10/31/2013   . Gastroesophageal reflux disease    . GERD (gastroesophageal reflux disease)     occasional , asymptomatic   . Headache     infrequently   . Heart murmur    . Hyperlipidemia     controlled with meds   . Hypertension     controlled w/meds   . Hypothyroidism     controlled with meds   . Low back pain    . Mitral valve insufficiency    . Mitral valve prolapse    . Orthopedic aftercare     scoliosis, stenosis, athritis   . Osteoporosis    . Pacemaker 05/2016    [place] card [place]   . Pneumonia     had pneumonia shot 2010 and 2015   . Primary cardiomyopathy    . Scoliosis     Severe   .  Shortness of breath    . Thyroid disease    . Vein disorder     bilateral legs       She has a past surgical history that includes Knee surgery (Left, 2002); Cataract extraction w/ intraocular lens implant (Right, 2001); Colonoscopy; Adenoidectomy; Appendectomy; Cardiac catheterization (2007); Cardioversion; Cardiac catheterization (2014); CLOSED REDUCTION, INSERTION, HIP IM NAIL (GAMMA) (Left, 05/20/2016); Insert / replace / remove pacemaker (05/25/2016); Joint replacement (Right, 10-15-15); Tonsillectomy and adenoidectomy; EGD, COLONOSCOPY (N/A, 12/09/2016); and Cardiac defibrillator placement (Left, 2017).      Social/Family History:      She reports that she has never smoked. She has never used smokeless tobacco. She reports that she drinks alcohol. She reports that she does not use drugs.    Family History   Problem Relation Age of Onset   . Pneumonia Mother    . Heart failure Father    . Hypertension Father    . Colon cancer Neg Hx    . Malignant hyperthermia Neg Hx    . Pseudochol deficiency Neg Hx    . Anesthesia problems Neg Hx          Listed Medications on Arrival:    .     Home Medications             acetaminophen (TYLENOL) 500 MG tablet     Take 1 tablet (500 mg  total) by mouth every 6 (six) hours as needed for Pain.     alendronate (FOSAMAX) 70 MG tablet     Take 70 mg by mouth every 7 days.Take 1 tab by mouth every 7 days with a full glass of water on an empty stomach. Do not take anything else by mouth or lie down for 30 mins.     Calcium Citrate-Vitamin D (CITRACAL + D PO)     Take 2 tablets by mouth daily.     carvedilol (COREG) 12.5 MG tablet     Take 1 tablet (12.5 mg total) by mouth every 12 (twelve) hours.     clopidogrel (PLAVIX) 75 mg tablet     Take 1 tablet (75 mg total) by mouth daily.     CO-ENZYME Q-10 PO     Take by mouth daily.     fluticasone furoate (ARNUITY ELLIPTA) 100 MCG/ACT Aerosol Powder, Breath Activtivatede     Inhale 1 puff into the lungs as needed.         furosemide  (LASIX) 20 MG tablet     Take 40 mg by mouth daily.         gabapentin (NEURONTIN) 100 MG capsule     Take 100 mg by mouth 3 (three) times daily.     iron-vitamin C (VITRON-C) 65-125 MG Tab     Take 1 tablet by mouth every tues and thurs.     levothyroxine (SYNTHROID, LEVOTHROID) 112 MCG tablet     Take 112 mcg by mouth Once a day at 6:00am.     Magnesium 250 MG Tab     Take 250 mg by mouth daily.     Multiple Vitamin (MULTIVITAMIN) tablet     Take 1 tablet by mouth daily.     potassium chloride (K-TAB, KLOR-CON) 10 MEQ tablet     TAKE TID BY MOUTH EVERY DAY     Probiotic Product (ALIGN) 4 MG Cap     Take by mouth.Take as directed         simvastatin (ZOCOR) 10 MG tablet     Take 10 mg by mouth nightly.     XARELTO 20 MG Tab     TAKE 20 MG BY MOUTH IN THE EVENING FOR DVT PROPHYLAXIS         Allergies: She is allergic to erythromycin; gluten meal; and tikosyn [dofetilide].            VISIT INFORMATION        Clinical Course in the ED:      5:30 PM - Discussed with Dr. Cassell Smiles, they will consult            Medications Given in the ED:    .     ED Medication Orders     None            Procedures:      Procedures      Interpretations:      DDx/Discussion -- Patient with run of V tach last night, asymptomatic now. No V tach noted here. ?electrolyte abnormality from accidental lasix overdose    Pulse Oximetry Analysis -  Low Normal - SpO2 90 % on RA    Plain Film Xray Interpretation --  Study and Reading: Chest xray - No acute disease  These studies were interpreted by the ED Physician, France Ravens, MD.    Monitor (rate/rhythm) -  NSR at low 80s    EKG Interpretation  Reviewed and Interpreted by  ER physician, France Ravens, MD   Rhythm: V-paced w freq pvc  Rate: 85  Intervals/conduction: Good capture  ST Segments/T wave: Neg Sgarbossa             RESULTS        Lab Results:      Results     Procedure Component Value Units Date/Time    Troponin I [[id number]] Collected:  04/06/17 0003    Specimen:  Blood Updated:  04/06/17  0042     Troponin I 0.01 ng/mL     Comprehensive metabolic panel [[id number]]  (Abnormal) Collected:  04/05/17 1709    Specimen:  Blood Updated:  04/05/17 1744     Glucose 90 mg/dL      BUN 78.2 (H) mg/dL      Creatinine 1.0 mg/dL      Sodium 956 mEq/L      Potassium 3.4 (L) mEq/L      Chloride 99 (L) mEq/L      CO2 30 (H) mEq/L      Calcium 9.3 mg/dL      Protein, Total 6.5 g/dL      Albumin 4.0 g/dL      AST (SGOT) 34 U/L      ALT 27 U/L      Alkaline Phosphatase 83 U/L      Bilirubin, Total 1.2 mg/dL      Globulin 2.5 g/dL      Albumin/Globulin Ratio 1.6    GFR [[id number]] Collected:  04/05/17 1709     Updated:  04/05/17 1744     EGFR 52.9    Magnesium [[id number]] Collected:  04/05/17 1709    Specimen:  Blood Updated:  04/05/17 1744     Magnesium 2.3 mg/dL     Phosphorus [[id number]] Collected:  04/05/17 1709    Specimen:  Blood Updated:  04/05/17 1744     Phosphorus 4.0 mg/dL     PT/APTT [[id number]]  (Abnormal) Collected:  04/05/17 1709     Updated:  04/05/17 1736     PT 18.3 (H) sec      PT INR 1.5 (H)     PT Anticoag. Given Within 48 hrs. None     PTT 35 sec     CBC with differential [[id number]]  (Abnormal) Collected:  04/05/17 1709    Specimen:  Blood from Blood Updated:  04/05/17 1726     WBC 4.20 x10 3/uL      Hgb 12.4 g/dL      Hematocrit 63.8 %      Platelets 138 (L) x10 3/uL      RBC 4.22 x10 6/uL      MCV 93.8 fL      MCH 29.4 pg      MCHC 31.3 (L) g/dL      RDW 18 (H) %      MPV 10.9 fL      Neutrophils 62.9 %      Lymphocytes Automated 26.4 %      Monocytes 8.1 %      Eosinophils Automated 1.9 %      Basophils Automated 0.5 %      Immature Granulocyte 0.2 %      Nucleated RBC 0.0 /100 WBC      Neutrophils Absolute 2.64 x10 3/uL      Abs Lymph Automated 1.11 x10 3/uL      Abs Mono Automated 0.34 x10 3/uL      Abs Eos Automated 0.08 x10 3/uL  Absolute Baso Automated 0.02 x10 3/uL      Absolute Immature Granulocyte 0.01 x10 3/uL      Absolute NRBC 0.00 x10 3/uL               Radiology Results:      No  orders to display               Scribe Attestation:      I was acting as a scribe for Criss Alvine, MD on Khokhar,Devery Ricka Burdock    Treatment Team: Scribe: Mel Almond is scribing for me on Nyce,Tai Y. This note and the patient instructions accurately reflect work and decisions made by me.  Criss Alvine, MD            Criss Alvine, MD  04/06/17 1500

## 2017-04-05 NOTE — ED Triage Notes (Signed)
Pt here for evaluation. Pt reported that her heart monitored showed vtach last night. Denies CP or SOB. AICD in place to L chest.

## 2017-04-05 NOTE — ED Notes (Signed)
Triage Note    Initial evaluation to expedite care- not definitive treatment.  83 y o female with episode of v-tech sent for ? Admission for 24 hour cardiac monitoring.     Maurine Minister, MD  04/05/17 [phone]

## 2017-04-05 NOTE — H&P (Addendum)
ADMISSION HISTORY AND PHYSICAL EXAM    Date Time: 04/05/17 10:40 PM  Patient Name: [name],[name]  Attending Physician: Verdene Lennert, MD  Primary Care Physician: Danford Bad, MD    CC: arrythmia      Assessment:   Active Problems:    AF (atrial fibrillation)    CHF (congestive heart failure)    Acquired hypothyroidism    Chronic systolic heart failure    PAH (pulmonary artery hypertension)    ASD (atrial septal defect)    Ventricular tachycardia    V-tach    Hypokalemia        83 yo F with chronic HFrEF, NICM, recent ASD closure(05/18), lower GIB(11/2016), ecurrent AF, and failed pharmacologic Rx with Amiodarone?tikosyn, s/p cardiac ablation, chronic AC, cardiac arrest while on Tikosyn, LHC in 10/17 negative for significant CAD, hypothyroidism, kyphoscoliosis p/w     #Ventricular tachycardia noted on AICD while on erroneously high dose of lasix for recent worsening of CHF.   Labs here show mild hypokalemia which is being repleted.   Mag is wnl, on supplemental Mag which will be continued.   Monitor on tele.   Replete electrolytes PRN.   Serial troponin and ECG.   Continue Home dose of Coreg/hold Lasix for now and continue strict I and O.     -H/o cardiac arrest(out of hospital) on Tikosyn, also with progresisve decline in LV function, rx with AICD(12/17). No shocks in the past.   Cardiology Dr Cassell Smiles aware and will consult in AM.   Continue cardiac diet.   -Recurrent Atrial fibrillation: s/p trial of amiodarone/Tikosyn, failed.   Extensive L atrial focus impulsive rotor modulation ablation with pulmonary vein isolation(02/2016).   [place] vasc score of 6.   Continue Xarelto and Coreg.   -NICM with EF of 25%(11/17) improved to 45% in 09/2016.   LHC in 10/17: no significant CAD.   S/p AICD following out of hospital cardiac arrest(05/2016).   S/p ASD closure(11/11/16), off Plavix after 3 months.   -HFrEF: recent increase in lasix for worsening symptoms and edema.   Likely over diuresed at this point given  quick weight loss and clinical evidence of volume depletion.   Hold Lasix for now.   Monitor on strict I and O and daily, weights.   Fluid restrict to 1.5L per day.     #GI: recent GIB(11/2016).  S/p EGD and C scopy without source identified.   VCE: showed  Ileal lymphangiectasia.   No issues since being back on Xarelto.   Her H/h is stable.   Continue Vitron C.     #Hypothryoidism/Scoliosis/osteoporosis: continue current home regimen.         #DVT ppx: xarelto.   Diet: cardiac  Activity:ad lib.   Code status: full code.         Disposition: (Please see PAF column for Expected D/C Date)   Today's date: 04/05/2017  Admit Date: 04/05/2017  4:17 PM  Service status: Inpatient: risk of morbidity and mortality  Clinical Milestones: Ventricular arrythmia.   Anticipated discharge needs: None    History of Presenting Illness:   [name] is a 83 Young.o. female who presents to the hospital at the advice of [place] heart office for AICD noted to have a run of V tac around midnight last night. Patient has been asymptomatic from this. She has however been not doing well as her Lasix dose was erroneously adjusted to 200mg  iat 2PM instead of 20mg  at [facility] as advised by MD due to  the bottle was read as 5 pills instead of 0.5 pills. She took this extra dose in the last 2 days and had lost about 7 lbs in the last 7 days. Her Lasix dose was being uptitrated to due worsening LE edema and SOBOE over the last 3 weeks. She denies any CP or palpitations. No other symptoms reported. No light headedness or dizziness. She had the AICD/pacer placed after she was admitted post cardiac arrest in 05/2016. She has never had any ICD shocks. She stopped taking her Plavix at the advice of cardiology after ASD closure.     Past Medical History:     Past Medical History:   Diagnosis Date   . Abnormal vision     Dry eyes; marginal vision L eye   . Abnormal vision     Wears glasses   . Anemia     controlled on meds   . Aortic valve insufficiency    .  Arrhythmia     Atrial fibrillation   . Arthritis     osteoarthritis, back and pelvis   . AVD (aortic valve disease)    . Benign heart murmur    . Bilateral cataracts     surgery, right eye   . Celiac disease    . Chronic obstructive pulmonary disease     mild-mod   . Difficulty walking     Back pain; Uses cane   . Disease of lung    . Disorder of musculoskeletal system ?    ?   Marland Kitchen Dyspnea on exertion    . Ear, nose and throat disorder     Hoarseness   . Encounter for cardioversion procedure 10/31/2013   . Gastroesophageal reflux disease    . GERD (gastroesophageal reflux disease)     occasional , asymptomatic   . Headache     infrequently   . Heart murmur    . Hyperlipidemia     controlled with meds   . Hypertension     controlled w/meds   . Hypothyroidism     controlled with meds   . Low back pain    . Mitral valve insufficiency    . Mitral valve prolapse    . Orthopedic aftercare     scoliosis, stenosis, athritis   . Osteoporosis    . Pacemaker 05/2016    Bring card [place]   . Pneumonia     had pneumonia shot 2010 and 2015   . Primary cardiomyopathy    . Scoliosis     Severe   . Shortness of breath    . Thyroid disease    . Vein disorder     bilateral legs       Available old records reviewed, including:  epic    Past Surgical History:     Past Surgical History:   Procedure Laterality Date   . ADENOIDECTOMY      as a child   . APPENDECTOMY      as a child   . CARDIAC CATHETERIZATION  2007   . CARDIAC CATHETERIZATION  2014   . CARDIAC DEFIBRILLATOR PLACEMENT Left 2017   . CARDIOVERSION      x3 for A Fib , w/TEE   . CATARACT EXTRACTION W/ INTRAOCULAR LENS IMPLANT Right 2001   . CLOSED REDUCTION, INSERTION, HIP IM NAIL (GAMMA) Left 05/20/2016    Procedure: CLOSED REDUCTION, INSERTION, HIP IM NAIL (GAMMA);  Surgeon: Timoteo Ace, MD;  Location: [facility];  Service: Orthopedics;  Laterality: Left;   . COLONOSCOPY      "years ago"   . EGD, COLONOSCOPY N/A 12/09/2016    Procedure: EGD, COLONOSCOPY;  Surgeon:  Annalee Genta, MD;  Location: [facility];  Service: Gastroenterology;  Laterality: N/A;   . INSERT / REPLACE / REMOVE PACEMAKER  05/25/2016    PM card dos   . JOINT REPLACEMENT Right 10-15-15     KNEE No Complications   . KNEE SURGERY Left 2002   . TONSILLECTOMY AND ADENOIDECTOMY         Family History:     Family History   Problem Relation Age of Onset   . Pneumonia Mother    . Heart failure Father    . Hypertension Father    . Colon cancer Neg Hx    . Malignant hyperthermia Neg Hx    . Pseudochol deficiency Neg Hx    . Anesthesia problems Neg Hx          Social History:     History   Smoking Status   . Never Smoker   Smokeless Tobacco   . Never Used     History   Alcohol Use   . Yes     Comment: Rarely     History   Drug Use No       Allergies:     Allergies   Allergen Reactions   . Erythromycin Hives   . Gluten Meal Other (See Comments)     Has Celiac disease   . Tikosyn [Dofetilide]        Medications:     Home Medications             acetaminophen (TYLENOL) 500 MG tablet     Take 1 tablet (500 mg total) by mouth every 6 (six) hours as needed for Pain.     alendronate (FOSAMAX) 70 MG tablet     Take 70 mg by mouth every 7 days.Take 1 tab by mouth every 7 days with a full glass of water on an empty stomach. Do not take anything else by mouth or lie down for 30 mins.     Calcium Citrate-Vitamin D (CITRACAL + D PO)     Take 2 tablets by mouth daily.     carvedilol (COREG) 12.5 MG tablet     Take 1 tablet (12.5 mg total) by mouth every 12 (twelve) hours.     clopidogrel (PLAVIX) 75 mg tablet     Take 1 tablet (75 mg total) by mouth daily.     CO-ENZYME Q-10 PO     Take by mouth daily.     fluticasone furoate (ARNUITY ELLIPTA) 100 MCG/ACT Aerosol Powder, Breath Activtivatede     Inhale 1 puff into the lungs as needed.         furosemide (LASIX) 20 MG tablet     Take 40 mg by mouth daily.         gabapentin (NEURONTIN) 100 MG capsule     Take 100 mg by mouth 3 (three) times daily.     iron-vitamin C (VITRON-C)  65-125 MG Tab     Take 1 tablet by mouth every tues and thurs.     levothyroxine (SYNTHROID, LEVOTHROID) 112 MCG tablet     Take 112 mcg by mouth Once a day at 6:00am.     Magnesium 250 MG Tab     Take 250 mg by mouth daily.     Multiple Vitamin (MULTIVITAMIN) tablet  Take 1 tablet by mouth daily.     potassium chloride (K-TAB, KLOR-CON) 10 MEQ tablet     TAKE TID BY MOUTH EVERY DAY     Probiotic Product (ALIGN) 4 MG Cap     Take by mouth.Take as directed         simvastatin (ZOCOR) 10 MG tablet     Take 10 mg by mouth nightly.     XARELTO 20 MG Tab     TAKE 20 MG BY MOUTH IN THE EVENING FOR DVT PROPHYLAXIS            Method by which medications were confirmed on admission: patient     Review of Systems:   All other systems were reviewed and are negative except: as per HPI    Physical Exam:   Patient Vitals for the past 24 hrs:   BP Temp Temp src Pulse Resp SpO2 Height Weight   04/05/17 2227 100/66 98.5 F (36.9 C) Oral 91 18 94 % 1.6 m (5\' 3" ) 58.1 kg (128 lb 1.6 oz)   04/05/17 2030 107/65 - - 80 18 95 % - -   04/05/17 2000 104/69 - - 79 21 94 % - -   04/05/17 1930 103/70 - - 79 17 95 % - -   04/05/17 1830 105/71 - - 79 21 95 % - -   04/05/17 1800 101/62 - - 80 15 91 % - -   04/05/17 1708 103/63 - - 80 (!) 23 94 % - -   04/05/17 1700 104/70 - - 80 (!) 24 95 % - -   04/05/17 1652 109/72 - - 80 (!) 23 (!) 81 % - -   04/05/17 1645 113/67 - - 80 22 98 % - -   04/05/17 1504 105/65 98.4 F (36.9 C) Oral 93 18 90 % - 59 kg (130 lb)     Body mass index is 22.69 kg/m.  No intake or output data in the 24 hours ending 04/05/17 2240    General: awake, alert, oriented x 3; no acute distress.  HEENT: perrla, eomi, sclera anicteric  oropharynx clear without lesions, mucous membranes dry  Neck: supple, no lymphadenopathy, no thyromegaly, no JVD, no carotid bruits  Cardiovascular: regular rate and rhythm, no murmurs, rubs or gallops  Lungs: clear to auscultation bilaterally, without wheezing, rhonchi, or rales  Abdomen: soft,  non-tender, non-distended; no palpable masses, no hepatosplenomegaly, normoactive bowel sounds, no rebound or guarding  Extremities: no clubbing, cyanosis, 1+ edema  Neuro: non focal   Skin: no rashes or lesions noted        Labs:     Results     Procedure Component Value Units Date/Time    Comprehensive metabolic panel [[id number]]  (Abnormal) Collected:  04/05/17 1709    Specimen:  Blood Updated:  04/05/17 1744     Glucose 90 mg/dL      BUN 40.9 (H) mg/dL      Creatinine 1.0 mg/dL      Sodium 811 mEq/L      Potassium 3.4 (L) mEq/L      Chloride 99 (L) mEq/L      CO2 30 (H) mEq/L      Calcium 9.3 mg/dL      Protein, Total 6.5 g/dL      Albumin 4.0 g/dL      AST (SGOT) 34 U/L      ALT 27 U/L      Alkaline Phosphatase 83 U/L  Bilirubin, Total 1.2 mg/dL      Globulin 2.5 g/dL      Albumin/Globulin Ratio 1.6    GFR [[id number]] Collected:  04/05/17 1709     Updated:  04/05/17 1744     EGFR 52.9    Magnesium [[id number]] Collected:  04/05/17 1709    Specimen:  Blood Updated:  04/05/17 1744     Magnesium 2.3 mg/dL     Phosphorus [[id number]] Collected:  04/05/17 1709    Specimen:  Blood Updated:  04/05/17 1744     Phosphorus 4.0 mg/dL     PT/APTT [[id number]]  (Abnormal) Collected:  04/05/17 1709     Updated:  04/05/17 1736     PT 18.3 (H) sec      PT INR 1.5 (H)     PT Anticoag. Given Within 48 hrs. None     PTT 35 sec     CBC with differential [[id number]]  (Abnormal) Collected:  04/05/17 1709    Specimen:  Blood from Blood Updated:  04/05/17 1726     WBC 4.20 x10 3/uL      Hgb 12.4 g/dL      Hematocrit 24.4 %      Platelets 138 (L) x10 3/uL      RBC 4.22 x10 6/uL      MCV 93.8 fL      MCH 29.4 pg      MCHC 31.3 (L) g/dL      RDW 18 (H) %      MPV 10.9 fL      Neutrophils 62.9 %      Lymphocytes Automated 26.4 %      Monocytes 8.1 %      Eosinophils Automated 1.9 %      Basophils Automated 0.5 %      Immature Granulocyte 0.2 %      Nucleated RBC 0.0 /100 WBC      Neutrophils Absolute 2.64 x10 3/uL      Abs Lymph  Automated 1.11 x10 3/uL      Abs Mono Automated 0.34 x10 3/uL      Abs Eos Automated 0.08 x10 3/uL      Absolute Baso Automated 0.02 x10 3/uL      Absolute Immature Granulocyte 0.01 x10 3/uL      Absolute NRBC 0.00 x10 3/uL         ECG: V paced, non specific IV conduction block,  Imaging personally reviewed, including: None    Safety Checklist  DVT prophylaxis:  CHEST guideline (See page [id number]) xarelto     Foley:  [place] [facility] Foley protocol none   IVs:  peripheral   PT/OT: NA   Daily CBC & or Chem ordered:  SHM/ABIM guidelines (see #5)    Reference for approximate charges of common labs: CBC auto diff - $76  BMP - $99  Mg - $79    Signed by: Brita Romp, MD   WN:UUVOZ, Molli Posey, MD

## 2017-04-06 ENCOUNTER — Inpatient Hospital Stay: Payer: Medicare Other

## 2017-04-06 ENCOUNTER — Inpatient Hospital Stay (HOSPITAL_BASED_OUTPATIENT_CLINIC_OR_DEPARTMENT_OTHER): Payer: Medicare Other

## 2017-04-06 DIAGNOSIS — I5022 Chronic systolic (congestive) heart failure: Secondary | ICD-10-CM

## 2017-04-06 LAB — LIPID PANEL
Cholesterol / HDL Ratio: 2.6
Cholesterol: 131 mg/dL (ref 0–199)
HDL: 51 mg/dL (ref 40–9999)
LDL Calculated: 53 mg/dL (ref 0–99)
Triglycerides: 137 mg/dL (ref 34–149)
VLDL Calculated: 27 mg/dL (ref 10–40)

## 2017-04-06 LAB — CBC
Absolute NRBC: 0 10*3/uL
Hematocrit: 35.8 % — ABNORMAL LOW (ref 37.0–47.0)
Hgb: 11.3 g/dL — ABNORMAL LOW (ref 12.0–16.0)
MCH: 29.1 pg (ref 28.0–32.0)
MCHC: 31.6 g/dL — ABNORMAL LOW (ref 32.0–36.0)
MCV: 92.3 fL (ref 80.0–100.0)
MPV: 11 fL (ref 9.4–12.3)
Nucleated RBC: 0 /100 WBC (ref 0.0–1.0)
Platelets: 134 10*3/uL — ABNORMAL LOW (ref 140–400)
RBC: 3.88 10*6/uL — ABNORMAL LOW (ref 4.20–5.40)
RDW: 18 % — ABNORMAL HIGH (ref 12–15)
WBC: 3.89 10*3/uL (ref 3.50–10.80)

## 2017-04-06 LAB — ECG 12-LEAD
Atrial Rate: 92 {beats}/min
Q-T Interval: 458 ms
QRS Duration: 170 ms
QTC Calculation (Bezet): 528 ms
R Axis: -69 degrees
T Axis: 81 degrees
Ventricular Rate: 80 {beats}/min

## 2017-04-06 LAB — HEMOLYSIS INDEX: Hemolysis Index: 20 — ABNORMAL HIGH (ref 0–18)

## 2017-04-06 LAB — BASIC METABOLIC PANEL
BUN: 27 mg/dL — ABNORMAL HIGH (ref 7.0–19.0)
CO2: 28 mEq/L (ref 22–29)
Calcium: 9 mg/dL (ref 7.9–10.2)
Chloride: 102 mEq/L (ref 100–111)
Creatinine: 1 mg/dL (ref 0.6–1.0)
Glucose: 97 mg/dL (ref 70–100)
Potassium: 4 mEq/L (ref 3.5–5.1)
Sodium: 141 mEq/L (ref 136–145)

## 2017-04-06 LAB — MAGNESIUM: Magnesium: 2 mg/dL (ref 1.6–2.6)

## 2017-04-06 LAB — PHOSPHORUS: Phosphorus: 4.2 mg/dL (ref 2.3–4.7)

## 2017-04-06 LAB — TROPONIN I
Troponin I: 0.01 ng/mL (ref 0.00–0.09)
Troponin I: 0.01 ng/mL (ref 0.00–0.09)

## 2017-04-06 LAB — GFR: EGFR: 52.9

## 2017-04-06 LAB — B-TYPE NATRIURETIC PEPTIDE: B-Natriuretic Peptide: 3328 pg/mL — ABNORMAL HIGH (ref 0–100)

## 2017-04-06 MED ORDER — MAGNESIUM OXIDE 400 MG TABS (WRAP)
200.0000 mg | ORAL_TABLET | Freq: Every day | ORAL | Status: DC
Start: 2017-04-06 — End: 2017-04-09
  Administered 2017-04-06 – 2017-04-09 (×4): 200 mg via ORAL
  Filled 2017-04-06 (×4): qty 1

## 2017-04-06 MED ORDER — RIVAROXABAN 15 MG PO TABS
15.0000 mg | ORAL_TABLET | Freq: Every day | ORAL | Status: DC
Start: 2017-04-06 — End: 2017-04-09
  Administered 2017-04-06 – 2017-04-08 (×3): 15 mg via ORAL
  Filled 2017-04-06 (×3): qty 1

## 2017-04-06 NOTE — OT Eval Note (Signed)
[facility]   Occupational Therapy Evaluation/Discharge    Patient: [name]    MRN#: [id number]   [facility]  Bed: FI356/FI356-01                                     Discharge Recommendations:   Discharge Recommendation: Home with supervision   DME Recommended for Discharge: Shower chair    Assessment:   [name] is a 83 y.o. female admitted 04/05/2017.  Expanded chart review completed including review of labs, review of imaging, review of vitals, review of past hospitalizations, review of H&P and physician progress notes and review of consulting physician notes. Pt presents at/near functional baseline, independent with basic ADLs.  No acute OT needs identified. D/C acute OT services.      Therapy Diagnosis: ADL assessment    Rehabilitation Potential: good    Treatment Activities: Evaluation     Educated the patient to role of occupational therapy, plan of care, goals of therapy and safety with mobility and ADLs, energy conservation techniques, pursed lip breathing.    Plan:   D/C acute OT services     Treatment/Interventions: No skilled interventions needed at this time    Risks/benefits/POC discussed with patient        Precautions and Contraindications:   Falls     Consult received for [name] for OT Evaluation and Treatment.  Patient's medical condition is appropriate for Occupational Therapy intervention at this time.      History of Present Illness:    [name] is a 83 y.o. female admitted on 04/05/2017 with "presents to the hospital at the advice of [place] heart office for AICD noted to have a run of V tac around midnight last night. Patient has been asymptomatic from this. She has however been not doing well as her Lasix dose was erroneously adjusted to 200mg  iat 2PM instead of 20mg  at [facility] as advised by MD due to the bottle was read as 5 pills instead of 0.5 pills. She took this extra dose in the last 2 days and had lost about 7 lbs in  the last 7 days. Her Lasix dose was being uptitrated to due worsening LE edema and SOBOE over the last 3 weeks. She denies any CP or palpitations. No other symptoms reported. No light headedness or dizziness. She had the AICD/pacer placed after she was admitted post cardiac arrest in 05/2016. She has never had any ICD shocks. She stopped taking her Plavix at the advice of cardiology after ASD closure." (Per chart)    Admitting Diagnosis: Ventricular tachycardia [I47.2]    Past Medical/Surgical History:  Past Medical History:   Diagnosis Date   . Abnormal vision     Dry eyes; marginal vision L eye   . Abnormal vision     Wears glasses   . Anemia     controlled on meds   . Aortic valve insufficiency    . Arrhythmia     Atrial fibrillation   . Arthritis     osteoarthritis, back and pelvis   . AVD (aortic valve disease)    . Benign heart murmur    . Bilateral cataracts     surgery, right eye   . Celiac disease    . Chronic obstructive pulmonary disease     mild-mod   . Difficulty walking  Back pain; Uses cane   . Disease of lung    . Disorder of musculoskeletal system ?    ?   Marland Kitchen Dyspnea on exertion    . Ear, nose and throat disorder     Hoarseness   . Encounter for cardioversion procedure 10/31/2013   . Gastroesophageal reflux disease    . GERD (gastroesophageal reflux disease)     occasional , asymptomatic   . Headache     infrequently   . Heart murmur    . Hyperlipidemia     controlled with meds   . Hypertension     controlled w/meds   . Hypothyroidism     controlled with meds   . Low back pain    . Mitral valve insufficiency    . Mitral valve prolapse    . Orthopedic aftercare     scoliosis, stenosis, athritis   . Osteoporosis    . Pacemaker 05/2016    Bring card [place]   . Pneumonia     had pneumonia shot 2010 and 2015   . Primary cardiomyopathy    . Scoliosis     Severe   . Shortness of breath    . Thyroid disease    . Vein disorder     bilateral legs       Past Surgical History:   Procedure Laterality Date   .  ADENOIDECTOMY      as a child   . APPENDECTOMY      as a child   . CARDIAC CATHETERIZATION  2007   . CARDIAC CATHETERIZATION  2014   . CARDIAC DEFIBRILLATOR PLACEMENT Left 2017   . CARDIOVERSION      x3 for A Fib , w/TEE   . CATARACT EXTRACTION W/ INTRAOCULAR LENS IMPLANT Right 2001   . CLOSED REDUCTION, INSERTION, HIP IM NAIL (GAMMA) Left 05/20/2016    Procedure: CLOSED REDUCTION, INSERTION, HIP IM NAIL (GAMMA);  Surgeon: Timoteo Ace, MD;  Location: [facility];  Service: Orthopedics;  Laterality: Left;   . COLONOSCOPY      "years ago"   . EGD, COLONOSCOPY N/A 12/09/2016    Procedure: EGD, COLONOSCOPY;  Surgeon: Annalee Genta, MD;  Location: [facility];  Service: Gastroenterology;  Laterality: N/A;   . INSERT / REPLACE / REMOVE PACEMAKER  05/25/2016    PM card dos   . JOINT REPLACEMENT Right 10-15-15     KNEE No Complications   . KNEE SURGERY Left 2002   . TONSILLECTOMY AND ADENOIDECTOMY             Imaging/Tests/Labs:  Reviewed     Social History:   Prior Level of Function: Ind ADLs, Ind mobility household distances, Mod I Social worker: [facility] community   Baseline Activity: Community level  DME Currently at Home: [facility], FWW  Home Living Arrangements: Husband  Type of Home: [facility]  Home Layout: stairs to basement    Subjective: "I just need breaks sometimes."    Patient is agreeable to participation in the therapy session. Nursing clears patient for therapy.     Patient Goal: To get better    Pain: No/Denies    Objective:   Patient is in bed with telemetry and peripheral IV in place.    Cognitive Status and Neuro Exam:  Alert and oriented  Pleasant, cooperative  Follows directions     Musculoskeletal Examination  Gross ROM: WFL  Gross Strength: WFL    Sensory/Oculomotor Examination  Auditory: [facility]  Tactile: WFL  Vision: WFL - glasses    Activities of Daily Living  Eating: Ind hand to mouth  Grooming: supervision  Bathing: NT  UE Dressing: Ind  LE Dressing: supervision  Toileting:  supervision transfer, peri-care and LB clothing mgmt    Functional Mobility:  Supine to Sit: supervision  Sit to Stand: supervision  Transfers: supervision with [facility]     PMP - Progressive Mobility Protocol   PMP Activity: Step 7 - Walks out of Room     Balance  Static Sitting: good  Dynamic Sitting: good  Static Standing: good  Dynamic Standing: fair +    Participation and Activity Tolerance  Participation Effort: good  Endurance: fair +, cued for pacing and PLB. Sp02: >90% room air    Patient left with call bell within reach, all needs met, SCDs off as found, fall mat in place, bed alarm off as found, tech in room, and all questions answered. RN notified of session outcome and patient response.     Goals: EVAL only                Ester Rink Shawndell Varas OTR/L  Pager # [phone]       Time of treatment:   OT Received On: 04/06/17  Start Time: 1447  Stop Time: 1515  Time Calculation (min): 28 min

## 2017-04-06 NOTE — Consults (Signed)
[place] HEART CARDIOLOGY CONSULTATION REPORT  [facility]    Date Time: 04/06/17 9:07 AM  Patient Name: [name],[name]  Requesting Physician: Zik, [name]       Reason for Consultation:   SOB, volume overload    History:   [name] is a 83 y.o. female admitted on 04/05/2017.  We have been asked by Zik, [name],  to provide cardiac consultation, regarding SOB.   Has  A history of HFrEF and S/P ASD closure, history of GIB and recurrent afib who has been having runs of VT on the ICD and was told to come to the ER. Patient denies any ICD shocks or palpitations. She was instructed to take lasix 40 mg 2 pills in AM and 0.5 pills in PM but my mistake has taken 5 pills in the evening for the past two days. She has been on increase lasix dosing due to worsening LE edema and SOB. Tells me that walking across her room causes dyspnea. Denies any lightlessness or dizziness.   Upon admission she has been having low BP. BUN was elevated at 27. No sig. Events on Tele.       Past Medical History:     Past Medical History:   Diagnosis Date   . Abnormal vision     Dry eyes; marginal vision L eye   . Abnormal vision     Wears glasses   . Anemia     controlled on meds   . Aortic valve insufficiency    . Arrhythmia     Atrial fibrillation   . Arthritis     osteoarthritis, back and pelvis   . AVD (aortic valve disease)    . Benign heart murmur    . Bilateral cataracts     surgery, right eye   . Celiac disease    . Chronic obstructive pulmonary disease     mild-mod   . Difficulty walking     Back pain; Uses cane   . Disease of lung    . Disorder of musculoskeletal system ?    ?   Marland Kitchen Dyspnea on exertion    . Ear, nose and throat disorder     Hoarseness   . Encounter for cardioversion procedure 10/31/2013   . Gastroesophageal reflux disease    . GERD (gastroesophageal reflux disease)     occasional , asymptomatic   . Headache     infrequently   . Heart murmur    . Hyperlipidemia     controlled with meds   .  Hypertension     controlled w/meds   . Hypothyroidism     controlled with meds   . Low back pain    . Mitral valve insufficiency    . Mitral valve prolapse    . Orthopedic aftercare     scoliosis, stenosis, athritis   . Osteoporosis    . Pacemaker 05/2016    Bring card [place]   . Pneumonia     had pneumonia shot 2010 and 2015   . Primary cardiomyopathy    . Scoliosis     Severe   . Shortness of breath    . Thyroid disease    . Vein disorder     bilateral legs       Past Surgical History:     Past Surgical History:   Procedure Laterality Date   . ADENOIDECTOMY      as a child   . APPENDECTOMY  as a child   . CARDIAC CATHETERIZATION  2007   . CARDIAC CATHETERIZATION  2014   . CARDIAC DEFIBRILLATOR PLACEMENT Left 2017   . CARDIOVERSION      x3 for A Fib , w/TEE   . CATARACT EXTRACTION W/ INTRAOCULAR LENS IMPLANT Right 2001   . CLOSED REDUCTION, INSERTION, HIP IM NAIL (GAMMA) Left 05/20/2016    Procedure: CLOSED REDUCTION, INSERTION, HIP IM NAIL (GAMMA);  Surgeon: Timoteo Ace, [name];  Location: [facility];  Service: Orthopedics;  Laterality: Left;   . COLONOSCOPY      "years ago"   . EGD, COLONOSCOPY N/A 12/09/2016    Procedure: EGD, COLONOSCOPY;  Surgeon: Annalee Genta, [name];  Location: [facility];  Service: Gastroenterology;  Laterality: N/A;   . INSERT / REPLACE / REMOVE PACEMAKER  05/25/2016    PM card dos   . JOINT REPLACEMENT Right 10-15-15     KNEE No Complications   . KNEE SURGERY Left 2002   . TONSILLECTOMY AND ADENOIDECTOMY         Family History:     Family History   Problem Relation Age of Onset   . Pneumonia Mother    . Heart failure Father    . Hypertension Father    . Colon cancer Neg Hx    . Malignant hyperthermia Neg Hx    . Pseudochol deficiency Neg Hx    . Anesthesia problems Neg Hx        Social History:     Social History     Social History   . Marital status: Married     Spouse name: N/A   . Number of children: N/A   . Years of education: N/A     Social History Main Topics   .  Smoking status: Never Smoker   . Smokeless tobacco: Never Used   . Alcohol use Yes      Comment: Rarely   . Drug use: No   . Sexual activity: No     Other Topics Concern   . Not on file     Social History Narrative   . No narrative on file       Allergies:     Allergies   Allergen Reactions   . Erythromycin Hives   . Gluten Meal Other (See Comments)     Has Celiac disease   . Tikosyn [Dofetilide]        Medications:     Prescriptions Prior to Admission   Medication Sig   . furosemide (LASIX) 20 MG tablet Take 40 mg by mouth daily.       Marland Kitchen acetaminophen (TYLENOL) 500 MG tablet Take 1 tablet (500 mg total) by mouth every 6 (six) hours as needed for Pain.   Marland Kitchen alendronate (FOSAMAX) 70 MG tablet Take 70 mg by mouth every 7 days.Take 1 tab by mouth every 7 days with a full glass of water on an empty stomach. Do not take anything else by mouth or lie down for 30 mins.   . Calcium Citrate-Vitamin D (CITRACAL + D PO) Take 2 tablets by mouth daily.   . carvedilol (COREG) 12.5 MG tablet Take 1 tablet (12.5 mg total) by mouth every 12 (twelve) hours.   . clopidogrel (PLAVIX) 75 mg tablet Take 1 tablet (75 mg total) by mouth daily.   Marland Kitchen CO-ENZYME Q-10 PO Take by mouth daily.   . fluticasone furoate (ARNUITY ELLIPTA) 100 MCG/ACT Aerosol Powder, Breath Activtivatede Inhale 1 puff into  the lungs as needed.       . gabapentin (NEURONTIN) 100 MG capsule Take 100 mg by mouth 3 (three) times daily.   . iron-vitamin C (VITRON-C) 65-125 MG Tab Take 1 tablet by mouth every tues and thurs.   Marland Kitchen levothyroxine (SYNTHROID, LEVOTHROID) 112 MCG tablet Take 112 mcg by mouth Once a day at 6:00am.   . Magnesium 250 MG Tab Take 250 mg by mouth daily.   . Multiple Vitamin (MULTIVITAMIN) tablet Take 1 tablet by mouth daily.   . potassium chloride (K-TAB, KLOR-CON) 10 MEQ tablet TAKE TID BY MOUTH EVERY DAY   . Probiotic Product (ALIGN) 4 MG Cap Take by mouth.Take as directed       . simvastatin (ZOCOR) 10 MG tablet Take 10 mg by mouth nightly.   [name] 20 MG Tab TAKE 20 MG BY MOUTH IN THE EVENING FOR DVT PROPHYLAXIS        Current Facility-Administered Medications   Medication Dose Route Frequency   . calcium citrate-Vitamin D  1 tablet Oral Daily   . carvedilol  12.5 mg Oral Q12H SCH   . ferrous sulfate 324 mg-ascorbic acid 500 mg combo dose   Oral Daily   . fluticasone furoate  1 puff Inhalation QAM   . gabapentin  100 mg Oral TID   . lactobacillus/streptococcus  1 capsule Oral Daily   . levothyroxine  112 mcg Oral Daily at 0600   . magnesium oxide  200 mg Oral Daily   . multivitamin  1 tablet Oral Daily   . potassium chloride  20 mEq Oral Daily   . rivaroxaban  20 mg Oral Daily with dinner   . simvastatin  10 mg Oral QHS   . sodium chloride (PF)  3 mL Intracatheter Q8H         Review of Systems:    Comprehensive review of systems including constitutional, eyes, ears, nose, mouth, throat, cardiovascular, GI, GU, musculoskeletal, integumentary, respiratory, neurologic, psychiatric, and endocrine is negative other than what is mentioned already in the history of present illness    Physical Exam:     VITAL SIGNS PHYSICAL EXAM   Vitals:    04/06/17 0742   BP: 104/65   Pulse: 80   Resp: 16   Temp: 97.6 F (36.4 C)   SpO2: 92%     Temp (24hrs), Avg:98.1 F (36.7 C), Min:97.6 F (36.4 C), Max:98.5 F (36.9 C)      Intake and Output Summary (Last 24 hours) at Date Time    Intake/Output Summary (Last 24 hours) at 04/06/17 0907  Last data filed at 04/06/17 0400   Gross per 24 hour   Intake              250 ml   Output              300 ml   Net              -50 ml       Telemetry: no changes Physical Exam  General: awake, alert, breathing comfortably, no acute distress  Head: normocephalic  Eyes: EOM's intact  Cardiovascular: regular rate and rhythm, normal S1, S2, no S3, no S4, no murmurs, rubs or gallops  Neck: + V wave  Lungs: Faint crackle on the R side  Abdomen: soft, non-tender, non-distended; no palpable masses,  normoactive bowel sounds  Extremities: no  edema  Pulse: equal pulses, 4/4 symmetric  Neurological: Alert and oriented X3, mood and  affect normal  Musculoskeletal: normal strength and tone     Labs Reviewed:     Recent Labs  Lab 04/06/17  0300 04/06/17  0003   Troponin I 0.01 0.01           Recent Labs  Lab 04/06/17  0259   Cholesterol 131   Triglycerides 137   HDL 51   LDL Calculated 53       Recent Labs  Lab 04/05/17  1709   Bilirubin, Total 1.2   Protein, Total 6.5   Albumin 4.0   ALT 27   AST (SGOT) 34       Recent Labs  Lab 04/05/17  1709   Magnesium 2.3       Recent Labs  Lab 04/05/17  1709   PT 18.3*   PT INR 1.5*   PTT 35       Recent Labs  Lab 04/06/17  0259 04/05/17  1709   WBC 3.89 4.20   Hgb 11.3* 12.4   Hematocrit 35.8* 39.6   Platelets 134* 138*       Recent Labs  Lab 04/06/17  0259 04/05/17  1709   Sodium 141 141   Potassium 4.0 3.4*   Chloride 102 99*   CO2 28 30*   BUN 27.0* 25.0*   Creatinine 1.0 1.0   EGFR 52.9 52.9   Glucose 97 90   Calcium 9.0 9.3       DIAGNOSTIC    EKG: Paced, atrial fib, PVC  chest X-ray->Not done.     Assessment:   1. Heart failure with reduced ejection fraction, New York Heart Association Class III, .  Dry weight goal  approximately 130 pounds.  Has been taking 240 dose of PO lasix by mistake. Has been having runs of VT on  the ICD most likely due to hypokalemia.  Maintained only on carvedilol at this time   due to relative hypotension.    2. Atrial septal defect, status post closure Nov 11, 2016, completed three months of Plavix.     3. Hospitalization for GI bleed in June 2018.  Source was not identified.  Require blood transfusion.   Transiently off Plavix at that time and Xarelto, though back on Xarelto now and has completed   Plavix post atrial septal defect.  4. Paroxysmal atrial fibrillation, status post failure of amiodarone, subsequent extensive left and   right atrial focus impulses rotator ablation with pulmonary vein isolation September 2017.  5. CHADS-VASc score of 4 on Xarelto.  The dose needs to be  adjusted. Creatine clearance is high 30s.   6. Out of hospital cardiac arrest December 2017.  7. Nonischemic cardiomyopathy, EF 20% to 25% by last echo in May 2018, status post ICD   December 2017.  8. COPD contributing to dyspnea as well; however, significant volume overload today.  9. Acute scoliosis with moderate restriction, also with severe DLCO abnormality managed by   Dr. Stacy Gardner.      Recommendations:    Chang Xarelto to 15 mg daily based on her Creatinine clearance.     Check ECHO. There was a decline in her EF and ? Suspicion for non-compaction.    When she goes home, will send her on Torsemide 20 mg BID.    When goes home, wills end her on Aldactone 25 mg daily.    Will reattempt to introduce low dose ACE or ARB. Can start with losartan 25 mg daily.  Signed by: Nonie Hoyer, [name]    [facility]  NP [place] (8am-5pm)  [name] Spectralink [phone] or [phone] (8am-5pm)  Arrhythmia Spectralink [phone] (8am-4:30pm)  After hours, non urgent consult line [phone]  After Hours, urgent consults [phone]

## 2017-04-06 NOTE — Student Consult (Signed)
[place] HEART CARDIOLOGY CONSULTATION REPORT  [facility]    Date Time: 04/06/17 8:20 AM  Patient Name: [name],[name]  Requesting Physician: Zik, [place] J, MD       Reason for Consultation:   HF exacerbation    History:   [name] is a 83 [name].o. female admitted on 04/05/2017.  We have been asked by Zik, [place] J, MD,  to provide cardiac consultation, regarding VT noted on ICD by EP and HF exacerbation.       83 yo female with PMHx of non-rheumatic MR, NICM HFrEF (%), hx ischemic embolic stroke s/p recent ASD closure (June 2018), recurrent and persistent AF s/p ablation and antiarrhythmic medication (dofetilide), hx cardiac arrest s/p ICD placement 05/2016 presenting to ED with run of VT noted on ICD by [facility] office. Patient states she was at [facility] and got a call from [facility] instructing her to come in. Seen by [facility] 10/8 for HF exacerbation, Lasix increased from 40 mg PO qd to 80 mg PO qd (60 mg qam and 20 mg (0.5 tab) qpm). Patient states she had been accidentally taking five (not 0.5 as intended) 40 mg tablets of Lasix for the last 2 days due to a mistake on Rx bottle. Denies chest pain, dizziness and palpitations. Reports being completely asymptomatic during yesterday's episode. States she's been having shortness of breath with activity for the last 2 months. Notices some mild increased swelling of LEs. Dry weight 130-132 lbs. This AM, does not feel that symptoms have improved.    Past Medical History:     Past Medical History:   Diagnosis Date   . Abnormal vision     Dry eyes; marginal vision L eye   . Abnormal vision     Wears glasses   . Anemia     controlled on meds   . Aortic valve insufficiency    . Arrhythmia     Atrial fibrillation   . Arthritis     osteoarthritis, back and pelvis   . AVD (aortic valve disease)    . Benign heart murmur    . Bilateral cataracts     surgery, right eye   . Celiac disease    . Chronic obstructive pulmonary disease     mild-mod   .  Difficulty walking     Back pain; Uses cane   . Disease of lung    . Disorder of musculoskeletal system ?    ?   Marland Kitchen Dyspnea on exertion    . Ear, nose and throat disorder     Hoarseness   . Encounter for cardioversion procedure 10/31/2013   . Gastroesophageal reflux disease    . GERD (gastroesophageal reflux disease)     occasional , asymptomatic   . Headache     infrequently   . Heart murmur    . Hyperlipidemia     controlled with meds   . Hypertension     controlled w/meds   . Hypothyroidism     controlled with meds   . Low back pain    . Mitral valve insufficiency    . Mitral valve prolapse    . Orthopedic aftercare     scoliosis, stenosis, athritis   . Osteoporosis    . Pacemaker 05/2016    Bring card [place]   . Pneumonia     had pneumonia shot 2010 and 2015   . Primary cardiomyopathy    . Scoliosis  Severe   . Shortness of breath    . Thyroid disease    . Vein disorder     bilateral legs       Past Surgical History:     Past Surgical History:   Procedure Laterality Date   . ADENOIDECTOMY      as a child   . APPENDECTOMY      as a child   . CARDIAC CATHETERIZATION  2007   . CARDIAC CATHETERIZATION  2014   . CARDIAC DEFIBRILLATOR PLACEMENT Left 2017   . CARDIOVERSION      x3 for A Fib , w/TEE   . CATARACT EXTRACTION W/ INTRAOCULAR LENS IMPLANT Right 2001   . CLOSED REDUCTION, INSERTION, HIP IM NAIL (GAMMA) Left 05/20/2016    Procedure: CLOSED REDUCTION, INSERTION, HIP IM NAIL (GAMMA);  Surgeon: Timoteo Ace, MD;  Location: [facility];  Service: Orthopedics;  Laterality: Left;   . COLONOSCOPY      "years ago"   . EGD, COLONOSCOPY N/A 12/09/2016    Procedure: EGD, COLONOSCOPY;  Surgeon: Annalee Genta, MD;  Location: [facility];  Service: Gastroenterology;  Laterality: N/A;   . INSERT / REPLACE / REMOVE PACEMAKER  05/25/2016    PM card dos   . JOINT REPLACEMENT Right 10-15-15     KNEE No Complications   . KNEE SURGERY Left 2002   . TONSILLECTOMY AND ADENOIDECTOMY         Family History:     Family  History   Problem Relation Age of Onset   . Pneumonia Mother    . Heart failure Father    . Hypertension Father    . Colon cancer Neg Hx    . Malignant hyperthermia Neg Hx    . Pseudochol deficiency Neg Hx    . Anesthesia problems Neg Hx        Social History:     Social History     Social History   . Marital status: Married     Spouse name: N/A   . Number of children: N/A   . Years of education: N/A     Social History Main Topics   . Smoking status: Never Smoker   . Smokeless tobacco: Never Used   . Alcohol use Yes      Comment: Rarely   . Drug use: No   . Sexual activity: No     Other Topics Concern   . Not on file     Social History Narrative   . No narrative on file       Allergies:     Allergies   Allergen Reactions   . Erythromycin Hives   . Gluten Meal Other (See Comments)     Has Celiac disease   . Tikosyn [Dofetilide]        Medications:     Prescriptions Prior to Admission   Medication Sig   . furosemide (LASIX) 20 MG tablet Take 40 mg by mouth daily.       Marland Kitchen acetaminophen (TYLENOL) 500 MG tablet Take 1 tablet (500 mg total) by mouth every 6 (six) hours as needed for Pain.   Marland Kitchen alendronate (FOSAMAX) 70 MG tablet Take 70 mg by mouth every 7 days.Take 1 tab by mouth every 7 days with a full glass of water on an empty stomach. Do not take anything else by mouth or lie down for 30 mins.   . Calcium Citrate-Vitamin D (CITRACAL + D PO) Take 2 tablets  by mouth daily.   . carvedilol (COREG) 12.5 MG tablet Take 1 tablet (12.5 mg total) by mouth every 12 (twelve) hours.   . clopidogrel (PLAVIX) 75 mg tablet Take 1 tablet (75 mg total) by mouth daily.   Marland Kitchen CO-ENZYME Q-10 PO Take by mouth daily.   . fluticasone furoate (ARNUITY ELLIPTA) 100 MCG/ACT Aerosol Powder, Breath Activtivatede Inhale 1 puff into the lungs as needed.       . gabapentin (NEURONTIN) 100 MG capsule Take 100 mg by mouth 3 (three) times daily.   . iron-vitamin C (VITRON-C) 65-125 MG Tab Take 1 tablet by mouth every tues and thurs.   Marland Kitchen levothyroxine  (SYNTHROID, LEVOTHROID) 112 MCG tablet Take 112 mcg by mouth Once a day at 6:00am.   . Magnesium 250 MG Tab Take 250 mg by mouth daily.   . Multiple Vitamin (MULTIVITAMIN) tablet Take 1 tablet by mouth daily.   . potassium chloride (K-TAB, KLOR-CON) 10 MEQ tablet TAKE TID BY MOUTH EVERY DAY   . Probiotic Product (ALIGN) 4 MG Cap Take by mouth.Take as directed       . simvastatin (ZOCOR) 10 MG tablet Take 10 mg by mouth nightly.   [name] 20 MG Tab TAKE 20 MG BY MOUTH IN THE EVENING FOR DVT PROPHYLAXIS        Current Facility-Administered Medications   Medication Dose Route Frequency   . calcium citrate-Vitamin D  1 tablet Oral Daily   . carvedilol  12.5 mg Oral Q12H SCH   . ferrous sulfate 324 mg-ascorbic acid 500 mg combo dose   Oral Daily   . fluticasone furoate  1 puff Inhalation QAM   . gabapentin  100 mg Oral TID   . lactobacillus/streptococcus  1 capsule Oral Daily   . levothyroxine  112 mcg Oral Daily at 0600   . magnesium oxide  200 mg Oral Daily   . multivitamin  1 tablet Oral Daily   . potassium chloride  20 mEq Oral Daily   . rivaroxaban  20 mg Oral Daily with dinner   . simvastatin  10 mg Oral QHS   . sodium chloride (PF)  3 mL Intracatheter Q8H         Review of Systems:    Comprehensive review of systems including constitutional, eyes, ears, nose, mouth, throat, cardiovascular, GI, GU, musculoskeletal, integumentary, respiratory, neurologic, psychiatric, and endocrine is negative other than what is mentioned already in the history of present illness    Physical Exam:     VITAL SIGNS PHYSICAL EXAM   Vitals:    04/06/17 0742   BP: 104/65   Pulse: 80   Resp: 16   Temp: 97.6 F (36.4 C)   SpO2: 92%     Temp (24hrs), Avg:98.1 F (36.7 C), Min:97.6 F (36.4 C), Max:98.5 F (36.9 C)      Intake and Output Summary (Last 24 hours) at Date Time    Intake/Output Summary (Last 24 hours) at 04/06/17 0820  Last data filed at 04/06/17 0400   Gross per 24 hour   Intake              250 ml   Output               300 ml   Net              -50 ml       Telemetry: frequent PVCs, V paced Physical Exam  General: awake, alert, breathing comfortably, no acute  distress  Head: normocephalic  Eyes: EOM's intact  Cardiovascular: regular rate and rhythm, normal S1, S2, no S3, no S4, 3/6 diastolic murmur  Neck: no carotid bruits, JVP 4 cm above sternal notch  Lungs: bibasilar crackles bilaterally   Abdomen: soft, non-tender, non-distended; no palpable masses,  normoactive bowel sounds  Extremities: trace edema to ankles bil  Pulse: equal pulses, 4/4 symmetric  Neurological: Alert and oriented X3, mood and affect normal  Musculoskeletal: normal strength and tone     Labs Reviewed:       Recent Labs  Lab 04/06/17  0300 04/06/17  0003   Troponin I 0.01 0.01           Recent Labs  Lab 04/06/17  0259   Cholesterol 131   Triglycerides 137   HDL 51   LDL Calculated 53       Recent Labs  Lab 04/05/17  1709   Bilirubin, Total 1.2   Protein, Total 6.5   Albumin 4.0   ALT 27   AST (SGOT) 34       Recent Labs  Lab 04/05/17  1709   Magnesium 2.3       Recent Labs  Lab 04/05/17  1709   PT 18.3*   PT INR 1.5*   PTT 35       Recent Labs  Lab 04/06/17  0259 04/05/17  1709   WBC 3.89 4.20   Hgb 11.3* 12.4   Hematocrit 35.8* 39.6   Platelets 134* 138*       Recent Labs  Lab 04/06/17  0259 04/05/17  1709   Sodium 141 141   Potassium 4.0 3.4*   Chloride 102 99*   CO2 28 30*   BUN 27.0* 25.0*   Creatinine 1.0 1.0   EGFR 52.9 52.9   Glucose 97 90   Calcium 9.0 9.3       DIAGNOSTIC    EKG (my read): rate 80, LAD, frequent PVCs, V-paced    chest X-ray: none    Assessment:    AF - persistent s/p ablation and amiodarone, dofetilide therapy   Out of hospital cardiac arrest s/p ICD placement 05/2016   CHADSVASc 6 (age, sex, HF, CVA), on rivaroxaban 20 mg qd   NICM, HFrEF (44% in 11/2015 Limited Bubble study), followed by HF clinic   ASD s/p ischemic embolic stroke and defect closure May 2018   Hypothyroidism on synthroid     Recommendations:    Better assess  volume status with CXR and BNP - physical exam unequivocal given decreased weight but presence of JVP and crackles which could be due to atelectasis...   Echo with contrast   Daily BMP/Mg while hospitalized, K > 4, Mg > 2   Potassium and Mg supplementation - KCl tab 10 mEq TID, Mag 250 mg qd   Consider adding spironolactone for low EF per HF guideline recommendations, monitor BP (patient has not been able to receive guideline directed therapy in the past due to persistent hypotension on these medications)   Continue Rivaroxaban but decrease dose to 15 mg given CrCl 39 ml/min   Stop trending troponin & EKGs   Interrogate ICD - EP recommendations appreciated            Signed by: Letitia Neri, BS    [facility]  NP Spectralink [phone] (8am-5pm)  MD Spectralink [phone] or [phone] (8am-5pm)  Arrhythmia Spectralink [phone] (8am-4:30pm)  After hours, non urgent consult line [phone]  After Hours, urgent  consults [phone]

## 2017-04-06 NOTE — UM Notes (Addendum)
Admit to inpatient:04/05/17 2003      Reported that her heart monitored showed vtach last night     83 y.o. female who presents to the hospital at the advice of [facility] office for AICD noted to have a run of V tac around midnight last night. Patient has been asymptomatic from this. She has however been not doing well as her Lasix dose was erroneously adjusted to 200mg  iat 2PM instead of 20mg  at [facility] as advised by MD due to the bottle was read as 5 pills instead of 0.5 pills. She took this extra dose in the last 2 days and had lost about 7 lbs in the last 7 days. Her Lasix dose was being uptitrated to due worsening LE edema and SOBOE over the last 3 weeks. She denies any CP or palpitations. No other symptoms reported. No light headedness or dizziness. She had the AICD/pacer placed after she was admitted post cardiac arrest in 05/2016. She has never had any ICD shocks. She stopped taking her Plavix at the advice of cardiology after ASD closure.     98.4, 93, 24, 105/65 sat down to 81%      ECG: V paced, non specific IV conduction block    Plan:  #Ventricular tachycardia noted on AICD while on erroneously high dose of lasix for recent worsening of CHF.   Labs here show mild hypokalemia which is being repleted.   Mag is wnl, on supplemental Mag which will be continued.   Monitor on tele.   Replete electrolytes PRN.   Serial troponin and ECG.   Continue Home dose of Coreg/hold Lasix for now and continue strict I and O.     -H/o cardiac arrest(out of hospital) on Tikosyn, also with progresisve decline in LV function, rx with AICD(12/17). No shocks in the past.   Cardiology Dr Cassell Smiles aware and will consult in AM.   Continue cardiac diet.   -Recurrent Atrial fibrillation: s/p trial of amiodarone/Tikosyn, failed.   Extensive L atrial focus impulsive rotor modulation ablation with pulmonary vein isolation(02/2016).   [place] vasc score of 6.   Continue Xarelto and Coreg.   -NICM with EF of 25%(11/17) improved to 45% in  09/2016.   LHC in 10/17: no significant CAD.   S/p AICD following out of hospital cardiac arrest(05/2016).   S/p ASD closure(11/11/16), off Plavix after 3 months.   -HFrEF: recent increase in lasix for worsening symptoms and edema.   Likely over diuresed at this point given quick weight loss and clinical evidence of volume depletion.   Hold Lasix for now.   Monitor on strict I and O and daily, weights.   Fluid restrict to 1.5L per day.     #GI: recent GIB(11/2016).  S/p EGD and C scopy without source identified.   VCE: showed  Ileal lymphangiectasia.   No issues since being back on Xarelto.   Her H/h is stable.   Continue Vitron C.     #Hypothryoidism/Scoliosis/osteoporosis: continue current home regimen.         #DVT ppx: xarelto.   Diet: cardiac  Activity:ad lib.   Code status: full code.         Disposition: (Please see PAF column for Expected D/C Date)   Today's date: 04/05/2017  Admit Date: 04/05/2017  4:17 PM  Service status: Inpatient: risk of morbidity and mortality  Clinical Milestones: Ventricular arrythmia.   Anticipated discharge needs: None      Cardiololgy   Reason for Consultation:  SOB, volume overload    History of HFrEF and S/P ASD closure, history of GIB and recurrent afib who has been having runs of VT on the ICD and was told to come to the ER. Patient denies any ICD shocks or palpitations. She was instructed to take lasix 40 mg 2 pills in AM and 0.5 pills in PM but my mistake has taken 5 pills in the evening for the past two days. She has been on increase lasix dosing due to worsening LE edema and SOB. Tells me that walking across her room causes dyspnea.    Recommendations:    Chang Xarelto to 15 mg daily based on her Creatinine clearance.     Check ECHO. There was a decline in her EF and ? Suspicion for non-compaction.    When she goes home, will send her on Torsemide 20 mg BID.    When goes home, wills end her on Aldactone 25 mg daily.    Will reattempt to introduce low dose  ACE or ARB. Can start with losartan 25 mg daily.       Kendrick Ranch  RN/CM  [facility]  Utilization Review Nurse  Cell # [phone]  Office # [phone]

## 2017-04-06 NOTE — Plan of Care (Signed)
ICD interrogation: [employer] Jude [place] DR set DDR 70-120bpm, persistent AF since April 2018, continues on xarelto, 2 episodes of NSVT 10/4 and 10/14, pt asymptomatic, no therapy required with know CM (EF 20 to 25%) continues on BB. Would not heighten therapy unless pt has more sustained events requiring therapy or if VT becomes symptomatic. Please call with questions.    Loreli Slot PA-C  [facility] EP [phone]

## 2017-04-06 NOTE — Plan of Care (Signed)
Admission Assessment:      Admitted from:   Report received from ED    Orientation: AOx 4   Rhythm on tele: A fib    Oxygen: RA   Ambulation: Stand by assist. Pt own cane at bedside.   Lines/Drips: PIV on R AC 18G   GI/GU: Continent x 2   Code Status: Full   Fall Score: Moderate      Verified patient ID/arm bands. Ensured appropriate safety precautions in place including: assigned fall score interventions; review of known allergies, and special needs; personal items within reach; call bell within reach.     Comments:   - see doc flow for complete assessment and vitals.     Plan:   - I/O, daily weight, 1.5L fluid restriction   - Troponin Q3H  - monitor VS   - cardiology consult in AM       Skin Assessment: Cosign Note:     Skin WNL EXCEPT for:     HEAD:   LUE:   RUE:   TORSO:   BACK:  SACRUM:  BUTTOCKS/INGUINAL/PERINEAL: Redness on buttocks   LLE: Redness   RLE: Redness   OTHER:

## 2017-04-06 NOTE — Plan of Care (Signed)
We received a cardiology consult request on the overnight [facility] consult line. Dr. Shanon Ace or Dr. Nash Shearer will be see the patient this morning.  Thank you,  Donnie Mesa  [facility]  Spectralinks:  APP [phone]  MD [phone]  MD [phone]

## 2017-04-06 NOTE — PT Eval Note (Signed)
[facility]   Physical Therapy Evaluation   Patient: [name]    MRN#: [id number]   [facility]  Bed: GN562/ZH086-57    Discharge Recommendations:   Discharge Recommendation: Home with supervision   DME Recommendation: in place -- pt has SPC and RW      Assessment:   [name] is a 83 y.o. female admitted 04/05/2017. Pt is currently independent to modified independent for functional mobility, and appears to be functioning close to her baseline. She has no further skilled PT needs at this time. Recommend continued ambulation with nursing at least 3x/daily.    Impairments: Assessment: Appears to be at baseline for mobility;Appears to be at baseline for balance;Appears to be at baseline with current Assistive Device.     Therapy Diagnosis: N/A    Rehabilitation Potential: N/A    Treatment Activities: Evaluation, bed mobility, transfers, gait, safety education, continued ambulation with nursing staff    Educated the patient to role of physical therapy, plan of care, goals of therapy and safety with mobility and ADLs, home safety.    Plan:   Treatment/Interventions: No skilled interventions needed at this time. D/c acute PT services.    PT Frequency: one time visit   Risks/Benefits/POC Discussed with Pt/Family: With patient        Precautions and Contraindications:   Other Precautions: Moderate Falls Risk    Activity Orders:  PMP    Consult received for [name] for PT Evaluation and Treatment.  Patient's medical condition is appropriate for Physical therapy intervention at this time.    Medical Diagnosis: Ventricular tachycardia [I47.2]      History of Present Illness:   [name] is a 83 y.o. female admitted on 04/05/2017 who "presents to the hospital at the advice of [facility] office for AICD noted to have a run of V tac around midnight last night. Patient has been asymptomatic from this. She has however been not doing well as her Lasix dose was  erroneously adjusted to 200mg  iat 2PM instead of 20mg  at [facility] as advised by MD due to the bottle was read as 5 pills instead of 0.5 pills. She took this extra dose in the last 2 days and had lost about 7 lbs in the last 7 days. Her Lasix dose was being uptitrated to due worsening LE edema and SOBOE over the last 3 weeks. She denies any CP or palpitations. No other symptoms reported. No light headedness or dizziness. She had the AICD/pacer placed after she was admitted post cardiac arrest in 05/2016. She has never had any ICD shocks. She stopped taking her Plavix at the advice of cardiology after ASD closure." per chart      Past Medical/Surgical History:  Past Medical History:   Diagnosis Date   . Abnormal vision     Dry eyes; marginal vision L eye   . Abnormal vision     Wears glasses   . Anemia     controlled on meds   . Aortic valve insufficiency    . Arrhythmia     Atrial fibrillation   . Arthritis     osteoarthritis, back and pelvis   . AVD (aortic valve disease)    . Benign heart murmur    . Bilateral cataracts     surgery, right eye   . Celiac disease    . Chronic obstructive pulmonary disease     mild-mod   . Difficulty walking  Back pain; Uses cane   . Disease of lung    . Disorder of musculoskeletal system ?    ?   Marland Kitchen Dyspnea on exertion    . Ear, nose and throat disorder     Hoarseness   . Encounter for cardioversion procedure 10/31/2013   . Gastroesophageal reflux disease    . GERD (gastroesophageal reflux disease)     occasional , asymptomatic   . Headache     infrequently   . Heart murmur    . Hyperlipidemia     controlled with meds   . Hypertension     controlled w/meds   . Hypothyroidism     controlled with meds   . Low back pain    . Mitral valve insufficiency    . Mitral valve prolapse    . Orthopedic aftercare     scoliosis, stenosis, athritis   . Osteoporosis    . Pacemaker 05/2016    Bring card [place]   . Pneumonia     had pneumonia shot 2010 and 2015   . Primary cardiomyopathy    . Scoliosis      Severe   . Shortness of breath    . Thyroid disease    . Vein disorder     bilateral legs     Past Surgical History:   Procedure Laterality Date   . ADENOIDECTOMY      as a child   . APPENDECTOMY      as a child   . CARDIAC CATHETERIZATION  2007   . CARDIAC CATHETERIZATION  2014   . CARDIAC DEFIBRILLATOR PLACEMENT Left 2017   . CARDIOVERSION      x3 for A Fib , w/TEE   . CATARACT EXTRACTION W/ INTRAOCULAR LENS IMPLANT Right 2001   . CLOSED REDUCTION, INSERTION, HIP IM NAIL (GAMMA) Left 05/20/2016    Procedure: CLOSED REDUCTION, INSERTION, HIP IM NAIL (GAMMA);  Surgeon: Timoteo Ace, MD;  Location: [facility];  Service: Orthopedics;  Laterality: Left;   . COLONOSCOPY      "years ago"   . EGD, COLONOSCOPY N/A 12/09/2016    Procedure: EGD, COLONOSCOPY;  Surgeon: Annalee Genta, MD;  Location: [facility];  Service: Gastroenterology;  Laterality: N/A;   . INSERT / REPLACE / REMOVE PACEMAKER  05/25/2016    PM card dos   . JOINT REPLACEMENT Right 10-15-15     KNEE No Complications   . KNEE SURGERY Left 2002   . TONSILLECTOMY AND ADENOIDECTOMY         X-Rays/Tests/Labs:  No results found.  Lab Results   Component Value Date/Time    HGB 11.3 (L) 04/06/2017 02:59 AM    HCT 35.8 (L) 04/06/2017 02:59 AM    K 4.0 04/06/2017 02:59 AM    NA 141 04/06/2017 02:59 AM    INR 1.5 (H) 04/05/2017 05:09 PM    TROPI 0.01 04/06/2017 03:00 AM    TROPI 0.01 04/06/2017 12:03 AM    TROPI 0.15 (H) 05/20/2016 12:48 PM    TROPI 0.24 (H) 05/19/2016 03:15 PM       Social History:      Prior Level of Function:  Prior level of function: Independent with ADLs, Ambulates independently, Other (Comment) (uses cane outside of home only)  Baseline Activity Level: Community ambulation  Driving: independent  Cooking: Yes  DME Currently at Home: Single point cane, Front wheel walker    Home Living Arrangements:  Living Arrangements: Spouse/significant other  Type of  Home: House  Home Layout: One level (1-2 STE, has stairs to basement (does  not access anymore))  DME Currently at Home: Single point cane, Front wheel walker    Subjective: "I don't feel like I need you."   Patient is agreeable to participation in the therapy session. Nursing clears patient for therapy.     Patient Goal: go home    Pain Assessment  Pain Assessment: No/denies pain    Objective:   Observation of Patient/Vital Signs:  Patient is in bed with telemetry, fall mat in place.     Cognition/Neuro Status  Arousal/Alertness: Appropriate responses to stimuli  Attention Span: Appears intact  Orientation Level: Oriented X4  Memory: Appears intact  Following Commands: Follows all commands and directions without difficulty  Safety Awareness: independent  Insights: Fully aware of deficits  Behavior: calm;cooperative;attentive    Sensation:  BUE/BLE intact to light touch    Musculoskeletal Examination:  Gross ROM  Right Upper Extremity ROM: within functional limits  Left Upper Extremity ROM: within functional limits  Right Lower Extremity ROM: within functional limits  Left Lower Extremity ROM: within functional limits    Gross Strength  Right Upper Extremity Strength: within functional limits  Left Upper Extremity Strength: within functional limits  Right Lower Extremity Strength: within functional limits  Left Lower Extremity Strength: within functional limits       Functional Mobility:  Supine to Sit: Independent  Scooting to EOB: Independent  Sit to Supine: Independent  Sit to Stand: Independent  Stand to Sit: Independent       Ambulation:  Ambulation: Modified Independent;with single point cane  Pattern: decreased cadence;decreased step length;Step through;Narrow BOS (steady without LOB)  PMP - Progressive Mobility Protocol   PMP Activity: Step 7 - Walks out of Room  Distance Walked (ft) (Step 6,7): 300 Feet    Balance:  Sitting - Static: Good  Sitting - Dynamic: Good  Standing - Static: Good (with SPC)  Standing - Dynamic: Good (with SPC)       Participation and Activity  Tolerance:  Participation Effort: excellent  Endurance: Endurance does not limit participation in activity    Patient left with call bell within reach, all needs met, SCDs off as found, fall mat in place, telemetry, bed alarm off and all questions answered. RN notified of session outcome and patient response. Tech at bedside.    Goals: Eval only.       Dionisio David, PT, DPT 3:30 PM 04/06/2017  Pager: [phone]     Time of treatment:   PT Received On: 04/06/17  Start Time: 1445  Stop Time: 1510  Time Calculation (min): 25 min

## 2017-04-06 NOTE — Progress Notes (Signed)
MEDICINE PROGRESS NOTE    Date Time: 04/06/17 3:11 PM  Patient Name: Claude,Corby Y  Attending Physician: Zik, [place] J, MD    Assessment:   Principal Problem:    V-tach  Active Problems:    AF (atrial fibrillation)    CHF (congestive heart failure)    Acquired hypothyroidism    Chronic systolic heart failure    PAH (pulmonary artery hypertension)    ASD (atrial septal defect)    Ventricular tachycardia    Hypokalemia  Resolved Problems:    * No resolved hospital problems. *      83 yo F with chronic HFrEF, NICM, recent ASD closure(05/18), lower GIB(11/2016), ecurrent AF, and failed pharmacologic Rx with Amiodarone?tikosyn, s/p cardiac ablation, chronic AC, cardiac arrest while on Tikosyn, LHC in 10/17 negative for significant CAD, hypothyroidism, kyphoscoliosis sent to ED after VT seen on ICD interrogation secondary to overdiuresis from incorrect lasix dosing [prescribed by outpatient cardiology].    Plan:   #VT, sp ICD due to hypovolemia   -- lasix held today.  -- monitor on telemetry.   -- repeat echo pending.     #NICM, sp ICD, HFrEF 38%  -- no LE edema.   -- ICD interrogation.   -- CXR ordered today due to crackles on exam.   -- BNP added for tomorrow AM.     #hx of ASD sp stroke, sp ASD closure 11/11/16  -- check echo with contrast.     #persistent AF, on Xarelto- dose decreased based on CrCl    #Hypokalemia- repleted.     #Hypomagnesemia- repleted.    #Hypothyroid on Synthroid     Case discussed with: RN, patient.     Safety Checklist:     DVT prophylaxis:  CHEST guideline (See page [id number]) Chemical and Mechanical   Foley:  [place] Rn Foley protocol Not present   IVs:  Peripheral IV   PT/OT: Ordered   Daily CBC & or Chem ordered:  SHM/ABIM guidelines (see #5) Yes, due to clinical and lab instability   Reference for approximate charges of common labs: CBC auto diff - $76  BMP - $99  Mg - $79    Lines:     Patient Lines/Drains/Airways Status    Active PICC Line / CVC Line / PIV Line / Drain / Airway /  Intraosseous Line / Epidural Line / ART Line / Line / Wound / Pressure Ulcer / NG/OG Tube     Name:   Placement date:   Placement time:   Site:   Days:    Peripheral IV 04/05/17 Right Antecubital  04/05/17    1728    Antecubital    less than 1                 Disposition: (Please see PAF column for Expected D/C Date)   Today's date: 04/06/2017  Admit Date: 04/05/2017  4:17 PM  LOS: 1  Clinical Milestones: monitor labs, echo  Anticipated discharge needs: tbd.      Subjective     CC: V-tach    Interval History/24 hour events: Patient denies nausea, vomiting, fever, chills, chest pain, shortness of breath.     Review of Systems:     As per HPI    Physical Exam:     VITAL SIGNS PHYSICAL EXAM   Temp:  [97.6 F (36.4 C)-99.1 F (37.3 C)] 99.1 F (37.3 C)  Heart Rate:  [79-91] 91  Resp Rate:  [15-24] 16  BP: (91-113)/(53-72) 105/61  Blood Glucose: 97    Telemetry: sinus.       Intake/Output Summary (Last 24 hours) at 04/06/17 1511  Last data filed at 04/06/17 0400   Gross per 24 hour   Intake              250 ml   Output              300 ml   Net              -50 ml    Physical Exam  General: awake, alert X 3  Cardiovascular: regular rate and rhythm, no murmurs, rubs or gallops  Lungs: Crackles bilateral bases.  Abdomen: soft, non-tender, non-distended; no palpable masses,  normoactive bowel sounds  Extremities: no edema, pulses 2+.       Meds:     Medications were reviewed:  Current Facility-Administered Medications   Medication Dose Route Frequency   . calcium citrate-Vitamin D  1 tablet Oral Daily   . carvedilol  12.5 mg Oral Q12H SCH   . ferrous sulfate 324 mg-ascorbic acid 500 mg combo dose   Oral Daily   . fluticasone furoate  1 puff Inhalation QAM   . gabapentin  100 mg Oral TID   . lactobacillus/streptococcus  1 capsule Oral Daily   . levothyroxine  112 mcg Oral Daily at 0600   . magnesium oxide  200 mg Oral Daily   . multivitamin  1 tablet Oral Daily   . potassium chloride  20 mEq Oral Daily   . rivaroxaban   15 mg Oral Daily with dinner   . simvastatin  10 mg Oral QHS   . sodium chloride (PF)  3 mL Intracatheter Q8H     Current Facility-Administered Medications   Medication Dose Route Frequency Last Rate     Current Facility-Administered Medications   Medication Dose Route   . acetaminophen  500 mg Oral   . naloxone  0.2 mg Intravenous         Labs:     Labs were reviewed:    Labs (last 72 hours):      Recent Labs  Lab 04/06/17  0259 04/05/17  1709   WBC 3.89 4.20   Hgb 11.3* 12.4   Hematocrit 35.8* 39.6   Platelets 134* 138*         Recent Labs  Lab 04/05/17  1709   PT 18.3*   PT INR 1.5*   PTT 35      Recent Labs  Lab 04/06/17  0259 04/05/17  1709   Sodium 141 141   Potassium 4.0 3.4*   Chloride 102 99*   CO2 28 30*   BUN 27.0* 25.0*   Creatinine 1.0 1.0   Calcium 9.0 9.3   Albumin  --  4.0   Protein, Total  --  6.5   Bilirubin, Total  --  1.2   Alkaline Phosphatase  --  83   ALT  --  27   AST (SGOT)  --  34   Glucose 97 90                   Microbiology, reviewed and are significant for:  Microbiology Results     None          Imaging, reviewed and are significant for:  No results found.      Signed by: Clydie Braun, PA

## 2017-04-06 NOTE — Progress Notes (Signed)
RN Transport note  I took [name] to XR for a @ view CXR she ambulated to and from stretcher and stood for study she was a fib 80's w V pacing at times she was a/o did not c/o sob SPO2 97% on RA RR 20. I brought pt back to 356 placed pt back on monitor and gave bedside handoff to [facility]

## 2017-04-06 NOTE — Plan of Care (Addendum)
Shift Note:      Orientation: AOx4  Rhythm on tele: V-paced on tele with frequent PVC's. ICD/PM. EF 45%   Oxygen: RA  Ambulation: Stand by to the bathroom   Pain: No c/o pain   Lines/Drips: n/a  GI/GU: I&O  Fall Score: Moderate fall risk.     CXR: Cardiomegaly. Small bilateral pleural effusions. Bibasilar atelectasis, left side greater than right, and mid right  lung atelectasis.    BNP -- 3,328    Comments:  - 1.5 ml fluid restriction  -  Gluten intolerant   - Xarelto changed to 15mg   - Echo ordered  - Seen by [facility]   - Soft BP's  - see doc flow for complete assessment and vitals.     Significant Shift Events and Questions for Attending:  - EKG    Plan:   - Cards to adjust medications   - Vitals Q4H      (Braden Score: 21)

## 2017-04-07 ENCOUNTER — Other Ambulatory Visit (INDEPENDENT_AMBULATORY_CARE_PROVIDER_SITE_OTHER): Payer: Self-pay

## 2017-04-07 LAB — BASIC METABOLIC PANEL
BUN: 23 mg/dL — ABNORMAL HIGH (ref 7.0–19.0)
CO2: 27 mEq/L (ref 22–29)
Calcium: 9.1 mg/dL (ref 7.9–10.2)
Chloride: 104 mEq/L (ref 100–111)
Creatinine: 0.9 mg/dL (ref 0.6–1.0)
Glucose: 97 mg/dL (ref 70–100)
Potassium: 4 mEq/L (ref 3.5–5.1)
Sodium: 140 mEq/L (ref 136–145)

## 2017-04-07 LAB — MAGNESIUM: Magnesium: 2.5 mg/dL (ref 1.6–2.6)

## 2017-04-07 LAB — HEMOLYSIS INDEX: Hemolysis Index: 18 (ref 0–18)

## 2017-04-07 LAB — GFR: EGFR: 59.8

## 2017-04-07 LAB — B-TYPE NATRIURETIC PEPTIDE: B-Natriuretic Peptide: 3317 pg/mL — ABNORMAL HIGH (ref 0–100)

## 2017-04-07 LAB — VITAMIN B12: Vitamin B-12: 1125 pg/mL — ABNORMAL HIGH (ref 211–911)

## 2017-04-07 MED ORDER — CARVEDILOL 6.25 MG PO TABS
6.2500 mg | ORAL_TABLET | Freq: Two times a day (BID) | ORAL | Status: DC
Start: 2017-04-07 — End: 2017-04-07

## 2017-04-07 MED ORDER — CARVEDILOL 6.25 MG PO TABS
6.2500 mg | ORAL_TABLET | Freq: Two times a day (BID) | ORAL | Status: DC
Start: 2017-04-07 — End: 2017-04-09
  Administered 2017-04-07 – 2017-04-09 (×3): 6.25 mg via ORAL
  Filled 2017-04-07 (×5): qty 1

## 2017-04-07 MED ORDER — SODIUM CHLORIDE 0.9 % IV BOLUS
500.0000 mL | Freq: Once | INTRAVENOUS | Status: DC
Start: 2017-04-07 — End: 2017-04-07

## 2017-04-07 MED ORDER — SPIRONOLACTONE 25 MG PO TABS
12.5000 mg | ORAL_TABLET | Freq: Every day | ORAL | Status: DC
Start: 2017-04-07 — End: 2017-04-09
  Administered 2017-04-07 – 2017-04-09 (×3): 12.5 mg via ORAL
  Filled 2017-04-07 (×3): qty 1

## 2017-04-07 MED ORDER — AMIODARONE HCL 200 MG PO TABS
400.0000 mg | ORAL_TABLET | Freq: Two times a day (BID) | ORAL | Status: DC
Start: 2017-04-08 — End: 2017-04-09
  Administered 2017-04-08 – 2017-04-09 (×3): 400 mg via ORAL
  Filled 2017-04-07 (×3): qty 2

## 2017-04-07 MED ORDER — PERFLUTREN PROTEIN A MICROSPH IV SUSP
3.0000 mL | Freq: Once | INTRAVENOUS | Status: AC
Start: 2017-04-07 — End: 2017-04-07
  Administered 2017-04-07: 11:00:00 3 mL via INTRAVENOUS
  Filled 2017-04-07: qty 3

## 2017-04-07 MED ORDER — AMIODARONE HCL 200 MG PO TABS
400.0000 mg | ORAL_TABLET | Freq: Every day | ORAL | Status: DC
Start: 2017-04-07 — End: 2017-04-07
  Administered 2017-04-07: 17:00:00 400 mg via ORAL
  Filled 2017-04-07 (×2): qty 2

## 2017-04-07 NOTE — Plan of Care (Addendum)
Shift Note:      Orientation: AOx 4  Rhythm on tele: V pacing   Oxygen: RA   Ambulation: Stand by assist   Pain: Denied pain   Lines/Drips: PIV R AC 18G   GI/GU: Continent x 2. Uses BR.   Fall Score: Moderate     Comments:   - see doc flow for complete assessment and vitals.     Plan:   - Vitals Q4H  - monitor daily weight, I/O   - 1.5L fluid restriction   - echo

## 2017-04-07 NOTE — Plan of Care (Signed)
Shift Note:      Orientation: AOx4  Rhythm on tele: V-paced on tele with frequent PVC's. ICD/PM. EF 45%   Oxygen: RA  Ambulation: Stand by to the bathroom   Pain: No c/o pain   Lines/Drips: n/a  GI/GU: I&O  Fall Score: Moderate fall risk.     CXR: Cardiomegaly. Small bilateral pleural effusions. Bibasilar atelectasis, left side greater than right, and mid right  lung atelectasis.    BNP -- 3,328    Comments:  - Blind in L eye  - 1.5 ml fluid restriction  - Gluten intolerant   - Xarelto changed to 15mg   - Echo done today   - Seen by [facility]   - Hypotensive today with SBP in the 80's. Pt was asymptomatic. Hold diuretics and hold fluids. Coreg stopped and started on spironolactone. Last BP was stable at 104/60.  - see doc flow for complete assessment and vitals.     Plan:   - Pending EP eval for PM. Extra spikes seen on EKG  - Plan to start on torsemide per note  - Cards to adjust medications   - Vitals Q4H      (Braden Score: 20)

## 2017-04-07 NOTE — Consults (Signed)
[facility]  EP CONSULTATION REPORT  [facility]        Date Time: 04/07/17 3:01 PM Patient Name: [name],[name]  Requesting Physician: Zik, [place] J, MD  Medical Record #:  [id number]  Account#:  [id number]  Admission Date:  04/05/2017      Impressions:    Persistent AF since April of 2018 mainly with CVR   S/p PVI and extensive left and right atrial ablation with focal impulse rotor    modulation-guided bitemporal dispersion ablation in September of 2017.     CHADSVASC 4 on xarelto   Prior ASD s/p closure Nov 11, 2016   VF arrest in the setting of a new CM while on tikosyn s/p ICD implant 12/17   Cardiac cath 10/17 no sig CAD, +ASD with R to L shunt EF 35%   NICM EF 20-25% 5/18 with mod MR, LAE   EF normal until 10/17   Hospitalization for GI bleed in June 2018 requiring blood transfusion, source was never identified however tolerating xarelto currently   Frequent PVC's in the past   Kyphoscoliosis with moderate restriction and severe DLCO abnl followed by Dr Stacy Gardner    Patient seen and examined, my assessment and plans as noted above.  83 year old old female with a complex cardiac history including variable LV function with progressive mitral regurgitation.  She had persistent AF with improvement of both LV function and MR with maintenance of SR, unfortunatly failed amiodarone, and underwent a complex AF ablation 02/2016.  Had a cardiac arrest in 05/2016 with ICD implantation..  Maintained SR off AAD until 10/2016, but has been in persistent AF with progressive LV decinline.  TTE with LVEF 25% with mod-sev MR.  50% VP, with generally rate control.    -- will reprogram to VVI 5 to minimize VP  -- have asked general cardiology to evaluate degree of MR and if functional vs. Primary valvular abnormality.  May need TEE to interrogate  -- in past, AF maintenance has helped improved MR/EF.  While amio had been ineffective, may have improved efficicacy now following ablaiton  -- will  plan for amio load over next month with CV to follow  -- Does have incomplete LBBB and baseline, and may ultimatly require resynchronization if above ineffective.    Signed by    Thurnell Lose, MD      Recommendations:    Decreased lower tracking rate to 50bpm and turned off rate response in an attempt to decrease RV pacing in case it may be contributing to the worsening EF. Will check EKG for QRS duration now without pacing but hold off on upgrade to BiV currently   Persistent AF with mainly CVR (Hr's slightly elevated today as pt has not gotten coreg dose), will start amio with plan for eventual CV in 51mo    Unclear cause of initial CM and now worsening CM, await echo re: severity of MR.     Reason for Consultation:   Upgrade of dual chamber ICD to BiV ICD    History:   [name] is a 83 Young.o. female admitted on 04/05/2017.  We have been asked by Zik, Italy J, MD,  to provide EP consultation, regarding upgrading device to a BiV ICD. Patient well known the practice with a hx of persistent AF, failure of amio and then an extensive biatrial  FIRM ablation Sept of 2017. She was loaded on tikosyn at the time of the procedure. Admitted to the  hospital after accidentally overdosing on lasix for 2 days, taking 240mg  each day. She noted extreme fatigue and was found to have an episode of NSVT on device remote monitoring 10/14 for which she was asymptomatic. Our office recommended she be admitted. On admission K was 3.4 which was repleted.   Past Medical History:     Past Medical History:   Diagnosis Date   . Abnormal vision     Dry eyes; marginal vision L eye   . Abnormal vision     Wears glasses   . Anemia     controlled on meds   . Aortic valve insufficiency    . Arrhythmia     Atrial fibrillation   . Arthritis     osteoarthritis, back and pelvis   . AVD (aortic valve disease)    . Benign heart murmur    . Bilateral cataracts     surgery, right eye   . Celiac disease    . Chronic obstructive pulmonary disease      mild-mod   . Difficulty walking     Back pain; Uses cane   . Disease of lung    . Disorder of musculoskeletal system ?    ?   Marland Kitchen Dyspnea on exertion    . Ear, nose and throat disorder     Hoarseness   . Encounter for cardioversion procedure 10/31/2013   . Gastroesophageal reflux disease    . GERD (gastroesophageal reflux disease)     occasional , asymptomatic   . Headache     infrequently   . Heart murmur    . Hyperlipidemia     controlled with meds   . Hypertension     controlled w/meds   . Hypothyroidism     controlled with meds   . Low back pain    . Mitral valve insufficiency    . Mitral valve prolapse    . Orthopedic aftercare     scoliosis, stenosis, athritis   . Osteoporosis    . Pacemaker 05/2016    Bring card [place]   . Pneumonia     had pneumonia shot 2010 and 2015   . Primary cardiomyopathy    . Scoliosis     Severe   . Shortness of breath    . Thyroid disease    . Vein disorder     bilateral legs       Past Surgical History:     Past Surgical History:   Procedure Laterality Date   . ADENOIDECTOMY      as a child   . APPENDECTOMY      as a child   . CARDIAC CATHETERIZATION  2007   . CARDIAC CATHETERIZATION  2014   . CARDIAC DEFIBRILLATOR PLACEMENT Left 2017   . CARDIOVERSION      x3 for A Fib , w/TEE   . CATARACT EXTRACTION W/ INTRAOCULAR LENS IMPLANT Right 2001   . CLOSED REDUCTION, INSERTION, HIP IM NAIL (GAMMA) Left 05/20/2016    Procedure: CLOSED REDUCTION, INSERTION, HIP IM NAIL (GAMMA);  Surgeon: Timoteo Ace, MD;  Location: [facility];  Service: Orthopedics;  Laterality: Left;   . COLONOSCOPY      "years ago"   . EGD, COLONOSCOPY N/A 12/09/2016    Procedure: EGD, COLONOSCOPY;  Surgeon: Annalee Genta, MD;  Location: [facility];  Service: Gastroenterology;  Laterality: N/A;   . INSERT / REPLACE / REMOVE PACEMAKER  05/25/2016    PM card dos   .  JOINT REPLACEMENT Right 10-15-15     KNEE No Complications   . KNEE SURGERY Left 2002   . TONSILLECTOMY AND ADENOIDECTOMY         Family History:      Family History   Problem Relation Age of Onset   . Pneumonia Mother    . Heart failure Father    . Hypertension Father    . Colon cancer Neg Hx    . Malignant hyperthermia Neg Hx    . Pseudochol deficiency Neg Hx    . Anesthesia problems Neg Hx        Social History:     Social History     Social History   . Marital status: Married     Spouse name: N/A   . Number of children: N/A   . Years of education: N/A     Social History Main Topics   . Smoking status: Never Smoker   . Smokeless tobacco: Never Used   . Alcohol use Yes      Comment: Rarely   . Drug use: No   . Sexual activity: No     Other Topics Concern   . Not on file     Social History Narrative   . No narrative on file       Allergies:     Allergies   Allergen Reactions   . Erythromycin Hives   . Gluten Meal Other (See Comments)     Has Celiac disease   . Tikosyn [Dofetilide]        Medications:     Prescriptions Prior to Admission   Medication Sig   . furosemide (LASIX) 20 MG tablet Take 40 mg by mouth daily.       Marland Kitchen acetaminophen (TYLENOL) 500 MG tablet Take 1 tablet (500 mg total) by mouth every 6 (six) hours as needed for Pain.   Marland Kitchen alendronate (FOSAMAX) 70 MG tablet Take 70 mg by mouth every 7 days.Take 1 tab by mouth every 7 days with a full glass of water on an empty stomach. Do not take anything else by mouth or lie down for 30 mins.   . Calcium Citrate-Vitamin D (CITRACAL + D PO) Take 2 tablets by mouth daily.   . carvedilol (COREG) 12.5 MG tablet Take 1 tablet (12.5 mg total) by mouth every 12 (twelve) hours.   . clopidogrel (PLAVIX) 75 mg tablet Take 1 tablet (75 mg total) by mouth daily.   Marland Kitchen CO-ENZYME Q-10 PO Take by mouth daily.   . fluticasone furoate (ARNUITY ELLIPTA) 100 MCG/ACT Aerosol Powder, Breath Activtivatede Inhale 1 puff into the lungs as needed.       . gabapentin (NEURONTIN) 100 MG capsule Take 100 mg by mouth 3 (three) times daily.   . iron-vitamin C (VITRON-C) 65-125 MG Tab Take 1 tablet by mouth every tues and thurs.   Marland Kitchen  levothyroxine (SYNTHROID, LEVOTHROID) 112 MCG tablet Take 112 mcg by mouth Once a day at 6:00am.   . Magnesium 250 MG Tab Take 250 mg by mouth daily.   . Multiple Vitamin (MULTIVITAMIN) tablet Take 1 tablet by mouth daily.   . potassium chloride (K-TAB, KLOR-CON) 10 MEQ tablet TAKE TID BY MOUTH EVERY DAY   . Probiotic Product (ALIGN) 4 MG Cap Take by mouth.Take as directed       . simvastatin (ZOCOR) 10 MG tablet Take 10 mg by mouth nightly.   [name] 20 MG Tab TAKE 20 MG BY MOUTH IN THE  EVENING FOR DVT PROPHYLAXIS       Current Facility-Administered Medications   Medication Dose Route Frequency Provider Last Rate Last Dose   . acetaminophen (TYLENOL) tablet 500 mg  500 mg Oral Q6H PRN Pullarkat, Annu N, MD       . calcium citrate-Vitamin D (CITRACAL+D) 315-250 MG-UNIT per tablet 1 tablet  1 tablet Oral Daily Pullarkat, Scherrie November, MD   1 tablet at 04/07/17 1056   . carvedilol (COREG) tablet 6.25 mg  6.25 mg Oral Q12H SCH Maghsoudi, Alireza, MD       . ferrous sulfate 324 mg-ascorbic acid 500 mg combo dose   Oral Daily Pullarkat, Annu N, MD       . fluticasone furoate (ARNUITY ELLIPTA) 100 MCG/ACT 1 puff  1 puff Inhalation QAM Pullarkat, Scherrie November, MD   1 puff at 04/07/17 1100   . gabapentin (NEURONTIN) capsule 100 mg  100 mg Oral TID Pullarkat, Scherrie November, MD   100 mg at 04/07/17 1056   . lactobacillus/streptococcus (RISAQUAD) capsule 1 capsule  1 capsule Oral Daily Pullarkat, Scherrie November, MD   1 capsule at 04/07/17 1056   . levothyroxine (SYNTHROID, LEVOTHROID) tablet 112 mcg  112 mcg Oral Daily at 0600 Pullarkat, Scherrie November, MD   112 mcg at 04/07/17 0515   . magnesium oxide (MAG-OX) tablet 200 mg  200 mg Oral Daily Zik, Italy J, MD   200 mg at 04/07/17 1056   . multivitamin tablet 1 tablet  1 tablet Oral Daily Pullarkat, Scherrie November, MD   1 tablet at 04/07/17 1056   . naloxone (NARCAN) injection 0.2 mg  0.2 mg Intravenous PRN Pullarkat, Annu N, MD       . potassium chloride (KLOR-CON) packet 20 mEq  20 mEq Oral Daily Pullarkat, Annu N,  MD   20 mEq at 04/07/17 1056   . rivaroxaban (XARELTO) tablet 15 mg  15 mg Oral Daily with dinner Lynford Humphrey, MD   15 mg at 04/06/17 1714   . simvastatin (ZOCOR) tablet 10 mg  10 mg Oral QHS Pullarkat, Scherrie November, MD   10 mg at 04/06/17 2232   . spironolactone (ALDACTONE) tablet 12.5 mg  12.5 mg Oral Daily Lynford Humphrey, MD   12.5 mg at 04/07/17 1056         Review of Systems:    Comprehensive review of systems including constitutional, eyes, ears, nose, mouth, throat, cardiovascular, GI, GU, musculoskeletal, integumentary, respiratory, neurologic, psychiatric, and endocrine is negative other than what is mentioned already in the history of present illness    Physical Exam:     VITAL SIGNS PHYSICAL EXAM   Vitals:    04/07/17 1100   BP: 104/60   Pulse: 80   Resp: 19   Temp: 97.8 F (36.6 C)   SpO2: 94%     Temp (24hrs), Avg:97.8 F (36.6 C), Min:97.4 F (36.3 C), Max:98.3 F (36.8 C)      Intake and Output Summary (Last 24 hours) at Date Time    Intake/Output Summary (Last 24 hours) at 04/07/17 1501  Last data filed at 04/07/17 0500   Gross per 24 hour   Intake              750 ml   Output              500 ml   Net              250 ml  Physical Exam  General: awake, alert, breathing comfortable, no acute distress  Head: normocephalic  Eyes: EOM's intact  Cardiovascular: irreg irreg rhythm   Neck: no carotid bruits or JVD  Lungs: clear to auscultation bilateraly, without wheezing, rhonchi, or rales  Abdomen: soft, non-tender, non-distended; normoactive bowel sounds  Extremities: no edema  Neurological: Alert and oriented X3, mood and affect normal       Labs Reviewed:     Recent Labs  Lab 04/06/17  0300 04/06/17  0003   Troponin I 0.01 0.01           Recent Labs  Lab 04/06/17  0259   Cholesterol 131   Triglycerides 137   HDL 51   LDL Calculated 53       Recent Labs  Lab 04/05/17  1709   Bilirubin, Total 1.2   Protein, Total 6.5   Albumin 4.0   ALT 27   AST (SGOT) 34       Recent Labs  Lab 04/07/17   0342   Magnesium 2.5       Recent Labs  Lab 04/05/17  1709   PT 18.3*   PT INR 1.5*   PTT 35       Recent Labs  Lab 04/06/17  0259 04/05/17  1709   WBC 3.89 4.20   Hgb 11.3* 12.4   Hematocrit 35.8* 39.6   Platelets 134* 138*       Recent Labs  Lab 04/07/17  0342 04/06/17  0259 04/05/17  1709   Sodium 140 141 141   Potassium 4.0 4.0 3.4*   Chloride 104 102 99*   CO2 27 28 30*   BUN 23.0* 27.0* 25.0*   Creatinine 0.9 1.0 1.0   EGFR 59.8 52.9 52.9   Glucose 97 97 90   Calcium 9.1 9.0 9.3           Diagnostics   EKG: AF with CVR 85bpm with VP and fused beats/intact conduction at times    Telemetry: AF with CVR and RVR at times to the 130's    Device Interrogation:      Echocardiogram: pending     chest X-ray: 1. Cardiomegaly.  2. Small bilateral pleural effusions.  3. Bibasilar atelectasis, left side greater than right, and mid right  lung atelectasis.          Signed by: Edythe Lynn, PA-C    [facility]  Arrhythmia Spectralink [phone] (8am-4:30pm)  [facility] Office Number [phone] (if EP line unavailable)  After hours, non urgent consult line [phone]  After Hours, urgent consults [phone]

## 2017-04-07 NOTE — Research Enrollment (Signed)
Study Name: HemosIL Liquid Anti-Xa with RIVAROXABAN Calibrators and Controls Performance Evaluation and Fresh vs. Frozen Stability Protocol on ACL TOP Family and ACL TOP 50 Family Series Analyzers    Subject meets inclusion/exclusion criteria for the above referenced study.    The study, informed consent, study procedures, follow-up requirements, potential risks, benefits, and study voluntary status explained to [name]  by Florence Canner on 04/06/17.      Subject given ample opportunity to consider all options, and ask questions. Subject also informed that research team will continue to provide updated information and changes to the study as the study requires.     [name] verbalizes understanding of study, potential risks, benefits, study voluntary status, follow-up requirements and had all questions answered to their satisfaction.    [name]  voluntarily agrees to participate in the study. Subject signs the Informed Consent Form.    Signed copy of the Informed Consent given to [name] . Signed Copy of the Informed Consent to be scanned for placement into the subject's electronic medical record.    Original signed copy of the Informed Consent kept in subject's RESEARCH chart.    No research driven tests / procedures were done prior to obtaining informed consent.    One time blood draw collected per protocol. No further research encounters required.     Other:         For study related questions, please contact:    Investigators: PI Dr. Tollie Pizza [phone] (after office hours; ask to page either Dr. Tollie Pizza or the doctor covering for Dr. Tollie Pizza), research coordinators [phone]; [phone]; (during office hours)

## 2017-04-07 NOTE — Progress Notes (Signed)
MEDICINE PROGRESS NOTE    Date Time: 04/07/17 10:34 AM  Patient Name: [name],[name]  Attending Physician: Zik, [place] J, MD    Assessment:   Principal Problem:    V-tach  Active Problems:    AF (atrial fibrillation)    CHF (congestive heart failure)    Acquired hypothyroidism    Chronic systolic heart failure    PAH (pulmonary artery hypertension)    ASD (atrial septal defect)    Ventricular tachycardia    Hypokalemia  Resolved Problems:    * No resolved hospital problems. *      83 yo F with chronic HFrEF, NICM, recent ASD closure(05/18), lower GIB(11/2016), ecurrent AF, and failed pharmacologic Rx with Amiodarone?tikosyn, s/p cardiac ablation, chronic AC, cardiac arrest while on Tikosyn, LHC in 10/17 negative for significant CAD, hypothyroidism, kyphoscoliosis sent to ED after VT seen on ICD interrogation secondary to overdiuresis from incorrect lasix dosing [prescribed by outpatient cardiology]. Diuretics are on hold. Her creatinine and potassium are stable.  She was slightly hypotensive this am but asymptomatic. Telemetry showed frequent pacer spikes concerning for dysynchrony and EP was consulted.     Plan:   #Hypotension this am  -- patient is slowly hydrating via PO intake.   -- continue to hold diuretics.   -- will monitor BP and hold off on bolus for now.     #VT, sp ICD due to hypovolemia   -- diuretics still on hold.  -- Echo pending.     #NICM, sp ICD, HFrEF 38%  -- no LE edema.   -- BNP 3317    #Possible pacer dysynchrony?  -- Ep consulted today for opinion on upgrade to BiV.   -- continue telemetry monitoring.     #hx of ASD sp stroke, sp ASD closure 11/11/16  -- check echo with contrast.     #persistent AF, on Xarelto- dose decreased to 15mg  today based on CrCl    #Hypokalemia- stable.     #Hypomagnesemia- add on magnesium lab pending.     #Hypothyroid on Synthroid- continue current dose.     #PT/OT- ambulate q shift.    Case discussed with: RN, patient.     Safety Checklist:     DVT  prophylaxis:  CHEST guideline (See page [id number]) Chemical and Mechanical   Foley:  [place] Rn Foley protocol Not present   IVs:  Peripheral IV   PT/OT: Ordered   Daily CBC & or Chem ordered:  SHM/ABIM guidelines (see #5) Yes, due to clinical and lab instability   Reference for approximate charges of common labs: CBC auto diff - $76  BMP - $99  Mg - $79    Lines:     Patient Lines/Drains/Airways Status    Active PICC Line / CVC Line / PIV Line / Drain / Airway / Intraosseous Line / Epidural Line / ART Line / Line / Wound / Pressure Ulcer / NG/OG Tube     Name:   Placement date:   Placement time:   Site:   Days:    Peripheral IV 04/05/17 Right Antecubital  04/05/17    1728    Antecubital    less than 1                 Disposition: (Please see PAF column for Expected D/C Date)   Today's date: 04/07/2017  Admit Date: 04/05/2017  4:17 PM  LOS: 2  Clinical Milestones: monitor labs, echo  Anticipated discharge needs: tbd.  Subjective     CC: V-tach    Interval History/24 hour events: Patient denies nausea, vomiting, fever, chills, chest pain, shortness of breath.     Review of Systems:     As per HPI    Physical Exam:     VITAL SIGNS PHYSICAL EXAM   Temp:  [97.4 F (36.3 C)-99.1 F (37.3 C)] 97.6 F (36.4 C)  Heart Rate:  [80-94] 81  Resp Rate:  [16-18] 18  BP: (86-112)/(58-77) 100/63  Blood Glucose: 97      Intake/Output Summary (Last 24 hours) at 04/07/17 1034  Last data filed at 04/07/17 0500   Gross per 24 hour   Intake              750 ml   Output              500 ml   Net              250 ml    Physical Exam  General: awake, alert X 3  Cardiovascular: regular rate and rhythm, no murmurs, rubs or gallops  Lungs: Crackles bilateral bases.  Abdomen: soft, non-tender, non-distended; no palpable masses,  normoactive bowel sounds  Extremities: no edema, pulses 2+.       Meds:     Medications were reviewed:  Current Facility-Administered Medications   Medication Dose Route Frequency   . calcium citrate-Vitamin D  1  tablet Oral Daily   . carvedilol  6.25 mg Oral Q12H SCH   . ferrous sulfate 324 mg-ascorbic acid 500 mg combo dose   Oral Daily   . fluticasone furoate  1 puff Inhalation QAM   . gabapentin  100 mg Oral TID   . lactobacillus/streptococcus  1 capsule Oral Daily   . levothyroxine  112 mcg Oral Daily at 0600   . magnesium oxide  200 mg Oral Daily   . multivitamin  1 tablet Oral Daily   . perflutren protein A microsph  3 mL Intravenous Once   . potassium chloride  20 mEq Oral Daily   . rivaroxaban  15 mg Oral Daily with dinner   . simvastatin  10 mg Oral QHS   . spironolactone  12.5 mg Oral Daily     Current Facility-Administered Medications   Medication Dose Route Frequency Last Rate     Current Facility-Administered Medications   Medication Dose Route   . acetaminophen  500 mg Oral   . naloxone  0.2 mg Intravenous         Labs:     Labs were reviewed:    Labs (last 72 hours):      Recent Labs  Lab 04/06/17  0259 04/05/17  1709   WBC 3.89 4.20   Hgb 11.3* 12.4   Hematocrit 35.8* 39.6   Platelets 134* 138*         Recent Labs  Lab 04/05/17  1709   PT 18.3*   PT INR 1.5*   PTT 35      Recent Labs  Lab 04/07/17  0342 04/06/17  0259 04/05/17  1709   Sodium 140 141 141   Potassium 4.0 4.0 3.4*   Chloride 104 102 99*   CO2 27 28 30*   BUN 23.0* 27.0* 25.0*   Creatinine 0.9 1.0 1.0   Calcium 9.1 9.0 9.3   Albumin  --   --  4.0   Protein, Total  --   --  6.5   Bilirubin, Total  --   --  1.2   Alkaline Phosphatase  --   --  83   ALT  --   --  27   AST (SGOT)  --   --  34   Glucose 97 97 90                   Microbiology, reviewed and are significant for:  Microbiology Results     None          Imaging, reviewed and are significant for:  Xr Chest 2 Views    Result Date: 04/06/2017  1. Cardiomegaly. 2. Small bilateral pleural effusions. 3. Bibasilar atelectasis, left side greater than right, and mid right lung atelectasis. Collene Schlichter, MD 04/06/2017 3:55 PM        Signed by: Clydie Braun, PA

## 2017-04-07 NOTE — Consults (Signed)
[facility]  "Preventing Delirium and Function Decline During Hospitalization"    [name], has been screened and enrolled in the [facility] Elder Life Program (HELP) on 04/07/17  .  They will begin to receive interventions carried out by trained volunteers wearing blue jackets that will maintain their cognitive and physical functioning while in the hospital.    [name] is at increased risk for hospital-acquired delirium and decline because of:     Mobility Impairment - Activities of Daily Living self reported by the patient and/or patient care companion.  Interventions such as; walk and range of motions exercises 3 x daily will be implemented.     Dehydration - as evidenced by BUN/Cr ratio 26.  Interventions such as; encourage fluids and set-up meals will be implemented.     Sleep Impairment - self reported by the patient and/or patient care companion.  Interventions such as; stress reduction exercises, hand massage and music will be implemented.    We appreciate your support of HELP and look forward to enhancing your patient's stay at [facility] on [facility].  Should you have any questions, please feel free to contact me.    Thank you!  Maudie Mercury, BS  HELP Elder Life Specialist  [phone]

## 2017-04-07 NOTE — Progress Notes (Signed)
[place] [facility] PROGRESS NOTE  [facility]      Date Time: 04/07/17 9:45 AM  Patient Name: [name], [name]  Medical Record #:  [id number]  Account#:  [id number]  Admission Date:  04/05/2017         Patient Active Problem List   Diagnosis   . Celiac disease   . Scoliosis   . Atrial fibrillation   . Mitral valve disorder   . Disease of tricuspid valve   . Chronic airway obstruction, not elsewhere classified   . AF (atrial fibrillation)   . Paroxysmal A-fib   . CHF (congestive heart failure)   . Ischemic embolic stroke   . Decreased diffusion capacity   . PFO (patent foramen ovale)   . Acquired hypothyroidism   . Chronic systolic heart failure   . PAH (pulmonary artery hypertension)   . Non-rheumatic mitral regurgitation   . Respiratory failure   . Ischemic stroke   . Altered mental status, unspecified altered mental status type   . Closed pertrochanteric fracture of femur, left, initial encounter   . Torsades de pointes   . ASD (atrial septal defect)   . Gastrointestinal hemorrhage, unspecified gastrointestinal hemorrhage type   . Coronary artery disease involving native heart without angina pectoris, unspecified vessel or lesion type   . Acute blood loss anemia   . Anemia   . Ventricular tachycardia   . V-tach   . Hypokalemia       Subjective:   Denies chest pain, SOB or palpitations while sitting still. BP was on the low side. ECHO done at bedside, not officially read, but shows severely reduced LV function, and at least 3 + MR.   She had low BP which improved on its own.     Assessment:   1.         Heart failure with reduced ejection fraction, New York Heart Association Class III, .  Dry weight  goal  approximately 130 pounds.  Has been taking 240 dose of PO lasix by mistake. Has  been  having runs of VT on the ICD most likely due to hypokalemia.  Maintained only on  carvedilol at  this time due to relative hypotension.    2.         Atrial septal defect, status post closure Nov 11, 2016, completed three  months of Plavix.                3.         Hospitalization for GI bleed in June 2018.  Source was not identified.  Require blood transfusion.              Transiently off Plavix at that time and Xarelto, though back on Xarelto now and has completed              Plavix post atrial septal defect.  4.         Paroxysmal atrial fibrillation, status post failure of amiodarone, subsequent extensive left and              right atrial focus impulses rotator ablation with pulmonary vein isolation September 2017.  5.         CHADS-VASc score of 4 on Xarelto.  The dose needs to be adjusted. Creatine clearance is  high 30s.   6.         Out of hospital cardiac arrest December 2017.  7.         Nonischemic cardiomyopathy, EF 20%  to 25% by last echo in May 2018, status post ICD              December 2017.  8.         COPD contributing to dyspnea as well; however, significant volume overload today.  9.         Acute scoliosis with moderate restriction, also with severe DLCO abnormality managed by              Dr. Stacy Gardner.          Recommendations:    This is a challenging case. EF has been declining and I see a lot of pacemaker spikes on the Tele. I wonder whether this is due to dyssynchrony as part of pacing. I will ask EP to resee regarding upgrade to BIV.    Decrease Coreg to 6.25 mg BID and start aldactone 12.5 mg.    Will try to introduce ARB eventually.    Will not give any lasix or fluid today. Eventual switch to Torsemide.    She will not be a good candidate for MV clips since she had ASD closure and device placement.         Medications:      Scheduled Meds:      calcium citrate-Vitamin D 1 tablet Oral Daily   carvedilol 12.5 mg Oral Q12H SCH   ferrous sulfate 324 mg-ascorbic acid 500 mg combo dose  Oral Daily   fluticasone furoate 1 puff Inhalation QAM   gabapentin 100 mg Oral TID   lactobacillus/streptococcus 1 capsule Oral Daily   levothyroxine 112 mcg Oral Daily at 0600   magnesium oxide 200 mg Oral Daily    multivitamin 1 tablet Oral Daily   perflutren protein A microsph 3 mL Intravenous Once   potassium chloride 20 mEq Oral Daily   rivaroxaban 15 mg Oral Daily with dinner   simvastatin 10 mg Oral QHS       Continuous Infusions:           Physical Exam:       VITAL SIGNS PHYSICAL EXAM   Vitals:    04/07/17 0853   BP: 100/63   Pulse:    Resp:    Temp:    SpO2:      Temp (24hrs), Avg:98 F (36.7 C), Min:97.4 F (36.3 C), Max:99.1 F (37.3 C)      Telemetry: Reviewed no changes      Intake/Output Summary (Last 24 hours) at 04/07/17 0945  Last data filed at 04/07/17 0500   Gross per 24 hour   Intake              750 ml   Output              500 ml   Net              250 ml    Physical Exam  General: awake, alert, breathing comfortably, no acute distress  Head: normocephalic  Eyes: EOM's intact  Cardiovascular: regular rate and rhythm, normal S1, S2, no S3, no S4, no murmurs, rubs or gallops  Neck: no carotid bruits or JVD  Lungs: Diminished BS B  Abdomen: soft, non-tender, non-distended; no palpable masses,  normoactive bowel sounds  Extremities: no edema  Pulse: equal pulses, 4/4 symmetric  Neurological: Alert and oriented X3, mood and affect normal  Musculoskeletal: normal strength and tone         Labs:     Recent Labs  Lab  04/06/17  0300 04/06/17  0003   Troponin I 0.01 0.01           Recent Labs  Lab 04/06/17  0259   Cholesterol 131   Triglycerides 137   HDL 51   LDL Calculated 53       Recent Labs  Lab 04/05/17  1709   Bilirubin, Total 1.2   Protein, Total 6.5   Albumin 4.0   ALT 27   AST (SGOT) 34       Recent Labs  Lab 04/06/17  0259   Magnesium 2.0       Recent Labs  Lab 04/05/17  1709   PT 18.3*   PT INR 1.5*   PTT 35       Recent Labs  Lab 04/06/17  0259 04/05/17  1709   WBC 3.89 4.20   Hgb 11.3* 12.4   Hematocrit 35.8* 39.6   Platelets 134* 138*       Recent Labs  Lab 04/07/17  0342 04/06/17  0259 04/05/17  1709   Sodium 140 141 141   Potassium 4.0 4.0 3.4*   Chloride 104 102 99*   CO2 27 28 30*   BUN  23.0* 27.0* 25.0*   Creatinine 0.9 1.0 1.0   EGFR 59.8 52.9 52.9   Glucose 97 97 90   Calcium 9.1 9.0 9.3           Invalid input(s): FREET4    .  Lab Results   Component Value Date    BNP 3,317 (H) 04/07/2017        Weight Monitoring 12/09/2016 12/10/2016 12/11/2016 04/05/2017 04/05/2017 04/06/2017 04/07/2017   Height - - - - 160 cm - -   Height Method - - - - Stated - -   Weight 58.877 kg 58.287 kg 58.423 kg 58.968 kg 58.106 kg 58.106 kg 57.924 kg   Weight Method Standing Scale Standing Scale - Stated Standing Scale Standing Scale Standing Scale   BMI (calculated) - - - - 22.7 kg/m2 - -       Imaging:       ____________________________________________    Signed by: Lynford Humphrey, MD      [facility]  NP Spectralink [phone] (8am-5pm)  MD Spectralink [phone] or [phone] (8am-5pm)  Arrhythmia Spectralink [phone] (8am-4:30pm)  After hours, non urgent consult line [phone]  After Hours, urgent consults [phone]

## 2017-04-08 LAB — BASIC METABOLIC PANEL
BUN: 21 mg/dL — ABNORMAL HIGH (ref 7.0–19.0)
CO2: 24 mEq/L (ref 22–29)
Calcium: 9.4 mg/dL (ref 7.9–10.2)
Chloride: 104 mEq/L (ref 100–111)
Creatinine: 1 mg/dL (ref 0.6–1.0)
Glucose: 111 mg/dL — ABNORMAL HIGH (ref 70–100)
Potassium: 4.8 mEq/L (ref 3.5–5.1)
Sodium: 138 mEq/L (ref 136–145)

## 2017-04-08 LAB — CBC AND DIFFERENTIAL
Absolute NRBC: 0 10*3/uL
Basophils Absolute Automated: 0.02 10*3/uL (ref 0.00–0.20)
Basophils Automated: 0.3 %
Eosinophils Absolute Automated: 0.14 10*3/uL (ref 0.00–0.70)
Eosinophils Automated: 2.4 %
Hematocrit: 41.7 % (ref 37.0–47.0)
Hgb: 13.2 g/dL (ref 12.0–16.0)
Immature Granulocytes Absolute: 0.01 10*3/uL
Immature Granulocytes: 0.2 %
Lymphocytes Absolute Automated: 2.31 10*3/uL (ref 0.50–4.40)
Lymphocytes Automated: 40.4 %
MCH: 29.5 pg (ref 28.0–32.0)
MCHC: 31.7 g/dL — ABNORMAL LOW (ref 32.0–36.0)
MCV: 93.3 fL (ref 80.0–100.0)
MPV: 11.2 fL (ref 9.4–12.3)
Monocytes Absolute Automated: 0.6 10*3/uL (ref 0.00–1.20)
Monocytes: 10.5 %
Neutrophils Absolute: 2.64 10*3/uL (ref 1.80–8.10)
Neutrophils: 46.2 %
Nucleated RBC: 0 /100 WBC (ref 0.0–1.0)
Platelets: 143 10*3/uL (ref 140–400)
RBC: 4.47 10*6/uL (ref 4.20–5.40)
RDW: 18 % — ABNORMAL HIGH (ref 12–15)
WBC: 5.72 10*3/uL (ref 3.50–10.80)

## 2017-04-08 LAB — ECG 12-LEAD
Atrial Rate: 38 {beats}/min
Q-T Interval: 446 ms
QRS Duration: 176 ms
QTC Calculation (Bezet): 530 ms
R Axis: -71 degrees
T Axis: 93 degrees
Ventricular Rate: 85 {beats}/min

## 2017-04-08 LAB — TSH: TSH: 1.97 u[IU]/mL (ref 0.35–4.94)

## 2017-04-08 LAB — GFR: EGFR: 52.9

## 2017-04-08 LAB — MAGNESIUM: Magnesium: 2.4 mg/dL (ref 1.6–2.6)

## 2017-04-08 MED ORDER — LOSARTAN POTASSIUM 25 MG PO TABS
12.5000 mg | ORAL_TABLET | Freq: Every day | ORAL | Status: DC
Start: 2017-04-08 — End: 2017-04-09
  Administered 2017-04-08 – 2017-04-09 (×2): 12.5 mg via ORAL
  Filled 2017-04-08 (×2): qty 1

## 2017-04-08 NOTE — Progress Notes (Signed)
[place] [facility]   PROGRESS NOTE     [facility]       Date Time: 04/08/17 3:01 PM Patient Name: [name], [name]  Medical Record #:  [id number] Account#:  [id number] Admission Date:  04/05/2017      Impressions:    Persistent atrial fibrillation s/p ablation 2017   ASD s/p closure   Ventricular fibrillation s/p ICD   Nonischemic cardiomyopathy   Prior bleed   PVCs    Recommendations:    Will confirm V pacing at 50 bpm   EKG after V pacing clearly at 50 to look at intrinisic QRS   Follow up in a month to plan for cardioversion after amiodarone loading    Medications:      Scheduled Meds:      amiodarone 400 mg Oral Q12H SCH   calcium citrate-Vitamin D 1 tablet Oral Daily   carvedilol 6.25 mg Oral Q12H SCH   ferrous sulfate 324 mg-ascorbic acid 500 mg combo dose  Oral Daily   fluticasone furoate 1 puff Inhalation QAM   gabapentin 100 mg Oral TID   lactobacillus/streptococcus 1 capsule Oral Daily   levothyroxine 112 mcg Oral Daily at 0600   losartan 12.5 mg Oral Daily   magnesium oxide 200 mg Oral Daily   multivitamin 1 tablet Oral Daily   rivaroxaban 15 mg Oral Daily with dinner   simvastatin 10 mg Oral QHS   spironolactone 12.5 mg Oral Daily       Continuous Infusions:       Subjective:   Feels tired, no palpitations    Physical Exam:     VITAL SIGNS PHYSICAL EXAM   Temp:  [97.4 F (36.3 C)-98.4 F (36.9 C)] 97.7 F (36.5 C)  Heart Rate:  [60-84] 81  Resp Rate:  [16-18] 18  BP: (98-129)/(65-80) 108/71  Temp (24hrs), Avg:97.8 F (36.6 C), Min:97.4 F (36.3 C), Max:98.4 F (36.9 C)          Intake/Output Summary (Last 24 hours) at 04/08/17 1501  Last data filed at 04/08/17 1111   Gross per 24 hour   Intake              400 ml   Output              500 ml   Net             -100 ml    Physical Exam  General: awake, sitting in bed, no acute distress  Head: normocephalic  Eyes: EOM's intact  Cardiovascular: irregular rhythm  Neck: no carotid bruits or JVD  Lungs: clear to auscultation  bilaterally, without wheezing, rhonchi, or rales  Abdomen: soft, non-tender, non-distended; normoactive bowel sounds  Extremities: no edema  Pulse: symmetrical  Neurological: Alert and oriented X3, mood and affect normal  Musculoskeletal:  Good strength and tone         Labs:             Recent Labs  Lab 04/08/17  0336   Magnesium 2.4       Recent Labs  Lab 04/05/17  1709   PT 18.3*   PT INR 1.5*   PTT 35       Recent Labs  Lab 04/08/17  0336 04/06/17  0259 04/05/17  1709   WBC 5.72 3.89 4.20   Hgb 13.2 11.3* 12.4   Hematocrit 41.7 35.8* 39.6   Platelets 143 134* 138*       Recent Labs  Lab  04/08/17  0336 04/07/17  0342 04/06/17  0259   Sodium 138 140 141   Potassium 4.8 4.0 4.0   Chloride 104 104 102   CO2 24 27 28    BUN 21.0* 23.0* 27.0*   Creatinine 1.0 0.9 1.0   EGFR 52.9 59.8 52.9   Glucose 111* 97 97   Calcium 9.4 9.1 9.0       Recent Labs  Lab 04/08/17  0336   TSH 1.97       Recent Labs  Lab 04/05/17  1709   Bilirubin, Total 1.2   Protein, Total 6.5   Albumin 4.0   ALT 27   AST (SGOT) 34       Recent Labs  Lab 04/06/17  0300 04/06/17  0003   Troponin I 0.01 0.01     .  Lab Results   Component Value Date    BNP 3,317 (H) 04/07/2017      Estimated Creatinine Clearance: 35.3 mL/min (based on SCr of 1 mg/dL).  Diagnostics:   Telemetry: af with V pacing        Signed by     Ida Rogue, MD    [facility]  [facility]    Arrhythmia Spectralink [phone] (8am-4:30pm)  After hours, non urgent consult line [phone]  After Hours, urgent consults [phone]  For EP questions or consults during the week, easiest to call Spectralink [phone]   You can also reach [place] 24/7 through our main EP office [phone].

## 2017-04-08 NOTE — Plan of Care (Signed)
Shift Note:      Orientation: AOx 4  Rhythm on tele: V-paced HR in the 80s. Pt has an ICD  Oxygen: RA.   Ambulation: Standby assist to the bathroom  Pain: denies  Lines/Drips: n/a  GI/GU: cont x2/ Last BM 10/17  Fall Score: Moderate.     Critical Labs/Imaging/Procedures:  BNP: 3,317  10/16 CXR: Cardiomegaly, small bilateral pleural effusions    Comments:  Pt is blind in the left eye  Pt is gluten intolerant  - see doc flow for complete assessment and vitals.     Significant Shift Events and Questions for Attending:    Plan:   Vitals Q4H  Continue Xarelto 15mg  for a-flutter  Gluten intolerant  Echo results pending      (Braden Score: ?18 include skin assessment template)

## 2017-04-08 NOTE — Plan of Care (Signed)
Shift Note:      Orientation: AOx4  Rhythm on tele: V-paced on tele; HR 80s; Pt has ICD  Oxygen: RA  Ambulation: Standby assist   Pain: Pt denies any pain at this time  Lines/Drips: 18g R AC  GI/GU: Continent x2  Fall Score: Moderate    Critical Labs/Imaging/Procedures:  - 10/16: CXR- Cardiomegaly, small bilateral pleural effusions  - 10/17: BNP- 3,317    Comments:  - Blind in L eye  - Gluten intolerant (celiacs)  - V pacing at 50 bpm  - see doc flow for complete assessment and vitals.     Significant Shift Events and Questions for Attending:    Plan:   - Vitals Q4H  - Continue Xarelto 15mg  for a-flutter  - Continue to monitor       (Braden Score: ?18 include skin assessment template)

## 2017-04-08 NOTE — Progress Notes (Signed)
HEART PROGRESS NOTE  [facility]      Date Time: 04/08/17 8:26 AM  Patient Name: [name], [name]  Medical Record #:  [id number]  Account#:  [id number]  Admission Date:  04/05/2017         Patient Active Problem List   Diagnosis   . Celiac disease   . Scoliosis   . Atrial fibrillation   . Mitral valve disorder   . Disease of tricuspid valve   . Chronic airway obstruction, not elsewhere classified   . AF (atrial fibrillation)   . Paroxysmal A-fib   . CHF (congestive heart failure)   . Ischemic embolic stroke   . Decreased diffusion capacity   . PFO (patent foramen ovale)   . Acquired hypothyroidism   . Chronic systolic heart failure   . PAH (pulmonary artery hypertension)   . Non-rheumatic mitral regurgitation   . Respiratory failure   . Ischemic stroke   . Altered mental status, unspecified altered mental status type   . Closed pertrochanteric fracture of femur, left, initial encounter   . Torsades de pointes   . ASD (atrial septal defect)   . Gastrointestinal hemorrhage, unspecified gastrointestinal hemorrhage type   . Coronary artery disease involving native heart without angina pectoris, unspecified vessel or lesion type   . Acute blood loss anemia   . Anemia   . Ventricular tachycardia   . V-tach   . Hypokalemia       Subjective:   Denies chest pain, SOB or palpitations.  ECHO showed dilated LV, severe LV function with EF of 20% and severe MR and TR. This is a significant decline from the previous EF.   Seen by the EP. She is being paced by her ICD about 50% of the time. Seen by DR. Amado Coe. Pacemaker reprogrammed to VVI 5 and started on Amio.     Assessment:   1.Heart failure with reduced ejection fraction, New York Heart Association Class III, . Dry weight             goal approximately 130 pounds. Has been taking 240 dose of PO lasix by mistake. Has  been    having runs of VT on the ICD most likely due to hypokalemia. Maintained only on           carvedilol at     this time due to  relative hypotension.   2.Atrial septal defect, status post closure Nov 11, 2016, completed three months of Plavix.    3.Hospitalization for GI bleed in June 2018. Source was not identified. Require blood transfusion.  Transiently off Plavix at that time and Xarelto, though back on Xarelto now and has completed  Plavix post atrial septal defect.  4.Paroxysmal atrial fibrillation, status post failure of amiodarone, subsequent extensive left and  right atrial focus impulses rotator ablation with pulmonary vein isolation September 2017.  5.CHADS-VASc score of 4 on Xarelto. The dose needs to be adjusted. Creatine clearance is    high 30s.   6.Out of hospital cardiac arrest December 2017.  7.Nonischemic cardiomyopathy, EF 20% to 25% by last echo in May 2018, status post ICD  December 2017. Repeat ECHO here showed EF of 22%, severe MR and TR, mod PHTN,  mildly dilated RV and decreased RV function.   8.COPD contributing to dyspnea as well; however, significant volume overload today.  9.Acute scoliosis with moderate restriction, also with severe DLCO abnormality managed by  Dr. Stacy Gardner.       Recommendations:  Continue the current cardiac meds including Coreg and amio.    I will start Losartan 25 mg to see whether she can tolerate small dose of afterload reduction.    If BPs are stable, resume diuretics tomorrow.    No benefit from a TEE since the MR is functional and not a good candidate for a MV clip in the setting of ASD closure.    Might need ann upgrade to BIV as outpatient.    Cardioversion as outpatient after one month of Amio.         Medications:      Scheduled Meds:      amiodarone 400 mg Oral Q12H SCH   calcium citrate-Vitamin D 1 tablet Oral Daily   carvedilol 6.25 mg Oral Q12H SCH   ferrous sulfate 324 mg-ascorbic acid 500 mg combo dose  Oral Daily   fluticasone  furoate 1 puff Inhalation QAM   gabapentin 100 mg Oral TID   lactobacillus/streptococcus 1 capsule Oral Daily   levothyroxine 112 mcg Oral Daily at 0600   magnesium oxide 200 mg Oral Daily   multivitamin 1 tablet Oral Daily   potassium chloride 20 mEq Oral Daily   rivaroxaban 15 mg Oral Daily with dinner   simvastatin 10 mg Oral QHS   spironolactone 12.5 mg Oral Daily       Continuous Infusions:           Physical Exam:       VITAL SIGNS PHYSICAL EXAM   Vitals:    04/08/17 0826   BP: 106/69   Pulse:    Resp:    Temp:    SpO2:      Temp (24hrs), Avg:97.7 F (36.5 C), Min:97.4 F (36.3 C), Max:98.3 F (36.8 C)      Telemetry: Reviewed no changes      Intake/Output Summary (Last 24 hours) at 04/08/17 0826  Last data filed at 04/08/17 0200   Gross per 24 hour   Intake              100 ml   Output              150 ml   Net              -50 ml    Physical Exam  General: awake, alert, breathing comfortably, no acute distress  Head: normocephalic  Eyes: EOM's intact  Cardiovascular: regular rate and rhythm, normal S1, S2, no S3, no S4, no murmurs, rubs or gallops  Neck: Elevated V wave   Lungs Diminished BS B   Abdomen: soft, non-tender, non-distended; no palpable masses,  normoactive bowel sounds  Extremities: no edema  Pulse: equal pulses, 4/4 symmetric  Neurological: Alert and oriented X3, mood and affect normal  Musculoskeletal: normal strength and tone         Labs:     Recent Labs  Lab 04/06/17  0300 04/06/17  0003   Troponin I 0.01 0.01           Recent Labs  Lab 04/06/17  0259   Cholesterol 131   Triglycerides 137   HDL 51   LDL Calculated 53       Recent Labs  Lab 04/05/17  1709   Bilirubin, Total 1.2   Protein, Total 6.5   Albumin 4.0   ALT 27   AST (SGOT) 34       Recent Labs  Lab 04/07/17  0342   Magnesium 2.5  Recent Labs  Lab 04/05/17  1709   PT 18.3*   PT INR 1.5*   PTT 35       Recent Labs  Lab 04/08/17  0336 04/06/17  0259 04/05/17  1709   WBC 5.72 3.89 4.20   Hgb 13.2 11.3* 12.4   Hematocrit 41.7  35.8* 39.6   Platelets 143 134* 138*       Recent Labs  Lab 04/08/17  0336 04/07/17  0342 04/06/17  0259   Sodium 138 140 141   Potassium 4.8 4.0 4.0   Chloride 104 104 102   CO2 24 27 28    BUN 21.0* 23.0* 27.0*   Creatinine 1.0 0.9 1.0   EGFR 52.9 59.8 52.9   Glucose 111* 97 97   Calcium 9.4 9.1 9.0       Recent Labs  Lab 04/08/17  0336   TSH 1.97       .  Lab Results   Component Value Date    BNP 3,317 (H) 04/07/2017        Weight Monitoring 12/10/2016 12/11/2016 04/05/2017 04/05/2017 04/06/2017 04/07/2017 04/08/2017   Height - - - 160 cm - - -   Height Method - - - Stated - - -   Weight 58.287 kg 58.423 kg 58.968 kg 58.106 kg 58.106 kg 57.924 kg 58.832 kg   Weight Method Standing Scale - Stated Standing Scale Standing Scale Standing Scale Standing Scale   BMI (calculated) - - - 22.7 kg/m2 - - -       Imaging:       ____________________________________________    Signed by: Lynford Humphrey, MD      [facility]  NP Spectralink [phone] (8am-5pm)  MD Spectralink [phone] or [phone] (8am-5pm)  Arrhythmia Spectralink [phone] (8am-4:30pm)  After hours, non urgent consult line [phone]  After Hours, urgent consults [phone]

## 2017-04-08 NOTE — Progress Notes (Signed)
NN visited patient today and delivered HF packet for the purpose of HF education. Patient verbalized that she's been managing her HF for the last 7 years and is pretty much aware of how to take of herself. Reinforced patient knowledge on 2 gm. Sodium restriction, 1.5 liters of fluid limitations, medication compliance and daily weights.  Patient showed this writer her log book of her daily VS and daily weights and notes of when she called her cardiologist office and their conversations regarding medication dosage changes and any other recommendations. Patient reported feeling bad about the misunderstanding on her dose of lasix in which she took for 2 days 5 tablets of 40 mg ( 200 mg) Instead of 0.5 tab of 40 mg (20).  Advised patient that next time she should clarify the order with her physicians especially if something doesn't feel right. Patient responded that she will be paying more attention to details next time.  Patient expressed understanding.    Plan of action discussed for disease management upon discharge. This includes:  1. Daily weights first thing in the morning wearing the same clothes and using the same scale  2. Restricting all fluids to no more than 1.5 Liters/day (48 oz)  3. Eating a low salt, heart healthy diet  4. Continue taking medications as prescribed  5. Increase activity as tolerated  6. Monitor for s/s of fluid overload (looking for swelling in hands, feet, ankles and abdomen)   7. Follow the CHF Warning Zones   8. Keep follow-up appointments with PCP, cardiologist and other specialist involved in your care.    Arturo Morton RN, BSN, [facility]  Patient Care Navigator  Cardiac Telemetry Unit [place]  [phone]

## 2017-04-08 NOTE — Progress Notes (Signed)
MEDICINE PROGRESS NOTE    Date Time: 04/08/17 1:31 PM  Patient Name: [name],[name]  Attending Physician: Zik, [place] J, MD    Assessment:   Principal Problem:    V-tach  Active Problems:    AF (atrial fibrillation)    CHF (congestive heart failure)    Acquired hypothyroidism    Chronic systolic heart failure    PAH (pulmonary artery hypertension)    ASD (atrial septal defect)    Ventricular tachycardia    Hypokalemia  Resolved Problems:    * No resolved hospital problems. *      83 yo F with chronic HFrEF, NICM, recent ASD closure(05/18), lower GIB(11/2016), ecurrent AF, and failed pharmacologic Rx with Amiodarone?tikosyn, s/p cardiac ablation, chronic AC, cardiac arrest while on Tikosyn, LHC in 10/17 negative for significant CAD, hypothyroidism, kyphoscoliosis sent to ED after VT seen on ICD interrogation secondary to overdiuresis from incorrect lasix dosing [prescribed by outpatient cardiology]. Diuretics are on hold. Her creatinine and potassium are stable.  She was slightly hypotensive this am but asymptomatic. Telemetry showed frequent pacer spikes concerning for dysynchrony and EP was consulted and RV tracking rate was lowered to help decrease RV pacing.  EP also started Amiodarone for persistent AF.    Plan:   #Hypotension this am  -- patient is slowly hydrating via PO intake.   -- continue to hold diuretics.   -- will monitor BP and hold off on bolus for now.     #VT, sp ICD due to hypovolemia- tele monitoring. Diuretics on hold.    #NICM, sp ICD, HFrEF 38%  -- lisinopril started today. Will monitor overnight to make sure BP can tolerate.  -- Patient overdiuresed prior to admission. Plan to resume diuretics in the AM if BP stable.     #Possible pacer dysynchrony  -- pacer tracking rate decreased in hopes of decreasing RV pacing.   -- plan to follow up with EP in one month for further discussion.     #hx of ASD sp stroke, sp ASD closure 11/11/16    #Mitral regurg, moderate to severe-     #Persistent AF, on  Xarelto  -- Xarelto dose decreased to 15mg  based on CrCl  -- EP started Amio load on 04/07/17 PM.     #Hypokalemia- resolved. Potassium repletion held today.     #Hypomagnesemia- now stable.     #Hypothyroid on Synthroid- continue current dose.     #Chronic thrombocytopenia- monitor CBC. No acute intervention necessary.     #PT/OT- ambulate q shift.    Case discussed with: RN, patient.     Safety Checklist:     DVT prophylaxis:  CHEST guideline (See page e199S) Chemical and Mechanical   Foley:  [place] [facility] Foley protocol Not present   IVs:  Peripheral IV   PT/OT: Ordered   Daily CBC & or Chem ordered:  SHM/ABIM guidelines (see #5) Yes, due to clinical and lab instability   Reference for approximate charges of common labs: CBC auto diff - $76  BMP - $99  Mg - $79    Lines:     Patient Lines/Drains/Airways Status    Active PICC Line / CVC Line / PIV Line / Drain / Airway / Intraosseous Line / Epidural Line / ART Line / Line / Wound / Pressure Ulcer / NG/OG Tube     Name:   Placement date:   Placement time:   Site:   Days:    Peripheral IV 04/05/17 Right Antecubital  04/05/17  1728    Antecubital    2                 Disposition: (Please see PAF column for Expected D/C Date)   Today's date: 04/08/2017  Admit Date: 04/05/2017  4:17 PM  LOS: 3  Clinical Milestones: diuresis, BP med titration.  Anticipated discharge needs: tbd.      Subjective     CC: V-tach    Interval History/24 hour events: Patient denies nausea, vomiting, fever, chills, chest pain, shortness of breath.     Review of Systems:     As per HPI    Physical Exam:     VITAL SIGNS PHYSICAL EXAM   Temp:  [97.4 F (36.3 C)-98.4 F (36.9 C)] 98.4 F (36.9 C)  Heart Rate:  [60-84] 75  Resp Rate:  [16-18] 18  BP: (98-129)/(65-80) 100/67  Blood Glucose: 111    Telemetry: Vpacing, afib.      Intake/Output Summary (Last 24 hours) at 04/08/17 1331  Last data filed at 04/08/17 1111   Gross per 24 hour   Intake              400 ml   Output              500 ml    Net             -100 ml    Physical Exam  General: awake, alert X 3  Cardiovascular: irregular rate and rhythm, no murmurs, rubs or gallops  Lungs: crackles at bases.   Abdomen: soft, non-tender, non-distended; no palpable masses,  normoactive bowel sounds  Extremities: no edema. Strength equal bilaterally.       Meds:     Medications were reviewed:  Current Facility-Administered Medications   Medication Dose Route Frequency   . amiodarone  400 mg Oral Q12H SCH   . calcium citrate-Vitamin D  1 tablet Oral Daily   . carvedilol  6.25 mg Oral Q12H SCH   . ferrous sulfate 324 mg-ascorbic acid 500 mg combo dose   Oral Daily   . fluticasone furoate  1 puff Inhalation QAM   . gabapentin  100 mg Oral TID   . lactobacillus/streptococcus  1 capsule Oral Daily   . levothyroxine  112 mcg Oral Daily at 0600   . losartan  12.5 mg Oral Daily   . magnesium oxide  200 mg Oral Daily   . multivitamin  1 tablet Oral Daily   . potassium chloride  20 mEq Oral Daily   . rivaroxaban  15 mg Oral Daily with dinner   . simvastatin  10 mg Oral QHS   . spironolactone  12.5 mg Oral Daily     Current Facility-Administered Medications   Medication Dose Route Frequency Last Rate     Current Facility-Administered Medications   Medication Dose Route   . acetaminophen  500 mg Oral   . naloxone  0.2 mg Intravenous         Labs:     Labs were reviewed:    Labs (last 72 hours):      Recent Labs  Lab 04/08/17  0336 04/06/17  0259   WBC 5.72 3.89   Hgb 13.2 11.3*   Hematocrit 41.7 35.8*   Platelets 143 134*         Recent Labs  Lab 04/05/17  1709   PT 18.3*   PT INR 1.5*   PTT 35      Recent Labs  Lab 04/08/17  0336 04/07/17  0342  04/05/17  1709   Sodium 138 140 More results in Results Review 141   Potassium 4.8 4.0 More results in Results Review 3.4*   Chloride 104 104 More results in Results Review 99*   CO2 24 27 More results in Results Review 30*   BUN 21.0* 23.0* More results in Results Review 25.0*   Creatinine 1.0 0.9 More results in Results  Review 1.0   Calcium 9.4 9.1 More results in Results Review 9.3   Albumin  --   --   --  4.0   Protein, Total  --   --   --  6.5   Bilirubin, Total  --   --   --  1.2   Alkaline Phosphatase  --   --   --  83   ALT  --   --   --  27   AST (SGOT)  --   --   --  34   Glucose 111* 97 More results in Results Review 90   More results in Results Review = values in this interval not displayed.                Microbiology, reviewed and are significant for:  Microbiology Results     None          Imaging, reviewed and are significant for:  No results found.      Signed by: Clydie Braun, PA

## 2017-04-09 LAB — CBC AND DIFFERENTIAL
Absolute NRBC: 0 10*3/uL
Basophils Absolute Automated: 0.03 10*3/uL (ref 0.00–0.20)
Basophils Automated: 0.5 %
Eosinophils Absolute Automated: 0.11 10*3/uL (ref 0.00–0.70)
Eosinophils Automated: 1.9 %
Hematocrit: 42.3 % (ref 37.0–47.0)
Hgb: 13.2 g/dL (ref 12.0–16.0)
Immature Granulocytes Absolute: 0.02 10*3/uL
Immature Granulocytes: 0.4 %
Lymphocytes Absolute Automated: 2.15 10*3/uL (ref 0.50–4.40)
Lymphocytes Automated: 37.9 %
MCH: 29.1 pg (ref 28.0–32.0)
MCHC: 31.2 g/dL — ABNORMAL LOW (ref 32.0–36.0)
MCV: 93.4 fL (ref 80.0–100.0)
MPV: 10.5 fL (ref 9.4–12.3)
Monocytes Absolute Automated: 0.52 10*3/uL (ref 0.00–1.20)
Monocytes: 9.2 %
Neutrophils Absolute: 2.84 10*3/uL (ref 1.80–8.10)
Neutrophils: 50.1 %
Nucleated RBC: 0 /100 WBC (ref 0.0–1.0)
Platelets: 155 10*3/uL (ref 140–400)
RBC: 4.53 10*6/uL (ref 4.20–5.40)
RDW: 18 % — ABNORMAL HIGH (ref 12–15)
WBC: 5.67 10*3/uL (ref 3.50–10.80)

## 2017-04-09 LAB — ECG 12-LEAD
Atrial Rate: 85 {beats}/min
Q-T Interval: 402 ms
QRS Duration: 130 ms
QTC Calculation (Bezet): 475 ms
R Axis: -42 degrees
T Axis: 137 degrees
Ventricular Rate: 84 {beats}/min

## 2017-04-09 LAB — BASIC METABOLIC PANEL
BUN: 23 mg/dL — ABNORMAL HIGH (ref 7.0–19.0)
CO2: 25 mEq/L (ref 22–29)
Calcium: 9.4 mg/dL (ref 7.9–10.2)
Chloride: 104 mEq/L (ref 100–111)
Creatinine: 1.1 mg/dL — ABNORMAL HIGH (ref 0.6–1.0)
Glucose: 104 mg/dL — ABNORMAL HIGH (ref 70–100)
Potassium: 4.8 mEq/L (ref 3.5–5.1)
Sodium: 138 mEq/L (ref 136–145)

## 2017-04-09 LAB — GFR: EGFR: 47.4

## 2017-04-09 LAB — B-TYPE NATRIURETIC PEPTIDE: B-Natriuretic Peptide: 2845 pg/mL — ABNORMAL HIGH (ref 0–100)

## 2017-04-09 MED ORDER — RIVAROXABAN 15 MG PO TABS
15.0000 mg | ORAL_TABLET | Freq: Every day | ORAL | 0 refills | Status: DC
Start: 2017-04-09 — End: 2017-04-09

## 2017-04-09 MED ORDER — FUROSEMIDE 20 MG PO TABS
20.0000 mg | ORAL_TABLET | Freq: Every day | ORAL | 0 refills | Status: DC
Start: 2017-04-09 — End: 2017-04-09

## 2017-04-09 MED ORDER — TORSEMIDE 20 MG PO TABS
20.0000 mg | ORAL_TABLET | Freq: Every day | ORAL | 0 refills | Status: DC
Start: 2017-04-09 — End: 2017-05-10

## 2017-04-09 MED ORDER — RIVAROXABAN 15 MG PO TABS
15.0000 mg | ORAL_TABLET | Freq: Every day | ORAL | 1 refills | Status: AC
Start: 2017-04-09 — End: ?

## 2017-04-09 MED ORDER — CARVEDILOL 6.25 MG PO TABS
6.2500 mg | ORAL_TABLET | Freq: Two times a day (BID) | ORAL | 0 refills | Status: DC
Start: 2017-04-09 — End: 2017-06-28

## 2017-04-09 MED ORDER — LOSARTAN POTASSIUM 25 MG PO TABS
12.5000 mg | ORAL_TABLET | Freq: Every day | ORAL | 0 refills | Status: DC
Start: 2017-04-10 — End: 2017-06-28

## 2017-04-09 MED ORDER — FUROSEMIDE 20 MG PO TABS
20.0000 mg | ORAL_TABLET | Freq: Every day | ORAL | Status: DC
Start: 2017-04-09 — End: 2017-04-09
  Administered 2017-04-09: 12:00:00 20 mg via ORAL
  Filled 2017-04-09: qty 1

## 2017-04-09 MED ORDER — MAGNESIUM OXIDE 400 MG TABS (WRAP)
200.0000 mg | ORAL_TABLET | Freq: Every day | ORAL | 0 refills | Status: AC
Start: 2017-04-10 — End: ?

## 2017-04-09 MED ORDER — SPIRONOLACTONE 25 MG PO TABS
12.5000 mg | ORAL_TABLET | Freq: Every day | ORAL | 0 refills | Status: DC
Start: 2017-04-10 — End: 2017-06-28

## 2017-04-09 MED ORDER — AMIODARONE HCL 200 MG PO TABS
ORAL_TABLET | ORAL | 0 refills | Status: DC
Start: 2017-04-09 — End: 2017-06-28

## 2017-04-09 NOTE — Progress Notes (Addendum)
[place] HE[facility] PROGRESS NOTE  [facility]      Date Time: 04/09/17 9:30 AM  Patient Name: [name], [name]  Medical Record #:  [id number]  Account#:  [id number]  Admission Date:  04/05/2017         Subjective:   Denies chest pain or palpitations. Still "breathless" as before  Weight is one pound under dry weight on the standing scale from yesterday  Cr stable  Basically net even       Assessment:    CHF. Came to hospital after taking wrong dose of Lasix (took 260mg  po Lasix for two days last weekend)   Dry weight  goal approximately 130 pounds   Having runs of VT on the ICD most likely due to hypokalemia   Atrial septal defect, status post closure Nov 11, 2016, completed three months of Plavix   Hospitalization for GI bleed in June 2018. Source was not identified. Require blood transfusion.Transiently off Plavix at that time and Xarelto, though back on Xarelto now and has completed. Plavix post atrial septal defect   Paroxysmal atrial fibrillation, status post failure of amiodarone, subsequent extensive left and right atrial focus impulses rotator ablation with pulmonary vein isolation September 2017   CHADS-VASc score of 4 on Xarelto. The dose needs to be adjusted. Creatine clearance is high 30s.   Out of hospital cardiac arrest December 2017   Nonischemic cardiomyopathy, EF 20% to 25% by last echo in May 2018, status post ICDDecember 2017.    Repeat ECHO here showed EF of 22%, severe MR and TR, mod PHTN,  mildly dilated RV and decreased RV function.    COPD contributing to dyspnea as well; however, significant volume overload   Acute scoliosis with moderate restriction, also with severe DLCO abnormality managed by Dr. Stacy Gardner.     Recommendations:    Continue CMPY meds including Coreg, losartan, aldactone and add on PO lasix today. I have given her a script for BMP prior to her followup appt in 3-5 business days. She will need to double lasix dose if she gains 2 lbs in one day or 5 lbes  in 1 week. She will continue to track her weights and BPs at home   Continue amio. EP following and plans to load Amio over a month, then consider DCCV. Continue DOAC. Might need an upgrade to BIV as outpatient.    No benefit from a TEE since the MR is functional and not a good candidate for a MV clip in the setting of ASD closure.    OK for discharge today and plans to followup with a BMP prior within 1 week. Our schedulers will call to arrange followup appt      Medications:      Scheduled Meds:        amiodarone 400 mg Oral Q12H SCH   calcium citrate-Vitamin D 1 tablet Oral Daily   carvedilol 6.25 mg Oral Q12H SCH   ferrous sulfate 324 mg-ascorbic acid 500 mg combo dose  Oral Daily   fluticasone furoate 1 puff Inhalation QAM   gabapentin 100 mg Oral TID   lactobacillus/streptococcus 1 capsule Oral Daily   levothyroxine 112 mcg Oral Daily at 0600   losartan 12.5 mg Oral Daily   magnesium oxide 200 mg Oral Daily   multivitamin 1 tablet Oral Daily   rivaroxaban 15 mg Oral Daily with dinner   simvastatin 10 mg Oral QHS   spironolactone 12.5 mg Oral Daily       Continuous Infusions:  Physical Exam:       VITAL SIGNS PHYSICAL EXAM   Vitals:    04/09/17 0919   BP: 103/59   Pulse: 80   Resp: 18   Temp: 98.2 F (36.8 C)   SpO2: 97%     Temp (24hrs), Avg:97.8 F (36.6 C), Min:97.3 F (36.3 C), Max:98.4 F (36.9 C)      Telemetry: Reviewed no changes      Intake/Output Summary (Last 24 hours) at 04/09/17 0930  Last data filed at 04/09/17 0004   Gross per 24 hour   Intake              200 ml   Output              350 ml   Net             -150 ml    Physical Exam  General: awake, alert, breathing comfortably, no acute distress  Head: normocephalic  Eyes: EOM's intact  Cardiovascular: regular rate and rhythm, normal S1, S2, no S3, no S4, no murmurs, rubs or gallops  Neck: Elevated V wave   Lungs Diminished BS B   Abdomen: soft, non-tender, non-distended; no palpable masses,  normoactive bowel  sounds  Extremities: no edema  Pulse: equal pulses, 4/4 symmetric  Neurological: Alert and oriented X3, mood and affect normal  Musculoskeletal: normal strength and tone         Labs:       Recent Labs  Lab 04/06/17  0300 04/06/17  0003   Troponin I 0.01 0.01           Recent Labs  Lab 04/06/17  0259   Cholesterol 131   Triglycerides 137   HDL 51   LDL Calculated 53       Recent Labs  Lab 04/05/17  1709   Bilirubin, Total 1.2   Protein, Total 6.5   Albumin 4.0   ALT 27   AST (SGOT) 34       Recent Labs  Lab 04/08/17  0336   Magnesium 2.4       Recent Labs  Lab 04/05/17  1709   PT 18.3*   PT INR 1.5*   PTT 35       Recent Labs  Lab 04/09/17  0530 04/08/17  0336 04/06/17  0259   WBC 5.67 5.72 3.89   Hgb 13.2 13.2 11.3*   Hematocrit 42.3 41.7 35.8*   Platelets 155 143 134*       Recent Labs  Lab 04/09/17  0530 04/08/17  0336 04/07/17  0342   Sodium 138 138 140   Potassium 4.8 4.8 4.0   Chloride 104 104 104   CO2 25 24 27    BUN 23.0* 21.0* 23.0*   Creatinine 1.1* 1.0 0.9   EGFR 47.4 52.9 59.8   Glucose 104* 111* 97   Calcium 9.4 9.4 9.1       Recent Labs  Lab 04/08/17  0336   TSH 1.97       .  Lab Results   Component Value Date    BNP 3,317 (H) 04/07/2017        Weight Monitoring 12/10/2016 12/11/2016 04/05/2017 04/05/2017 04/06/2017 04/07/2017 04/08/2017   Height - - - 160 cm - - -   Height Method - - - Stated - - -   Weight 58.287 kg 58.423 kg 58.968 kg 58.106 kg 58.106 kg 57.924 kg 58.832 kg   Weight Method Standing Scale -  Stated Standing Scale Standing Scale Standing Scale Standing Scale   BMI (calculated) - - - 22.7 kg/m2 - - -       Imaging:       ____________________________________________    Signed by: Dreama Saa, PA    Patient seen and examined, normal JVP, euvolemic on exam, my assessment and plans as noted above.    Signed by    Donnie Mesa, MD      [facility]  NP Spectralink [phone] (8am-5pm)  MD Spectralink [phone] or [phone] (8am-5pm)  Arrhythmia Spectralink [phone] (8am-4:30pm)  After hours, non  urgent consult line [phone]  After Hours, urgent consults [phone]

## 2017-04-09 NOTE — Plan of Care (Signed)
Shift Note:      Orientation: AOx4   Rhythm on tele: V-paced, A-Fib / ICD  Oxygen: room air  Ambulation: standby assist  Pain: denies  Lines/Drips: n/a  GI/GU: continent x2  Fall Score: moderate    Critical Labs/Imaging/Procedures:   -CXR-cardiomegaly, small pleural effusions  -BNP 3317 on 10/17    Comments: Pt is blind in left eye  -denies SOB, chest pain, dizziness  - see doc flow for complete assessment and vitals.     Significant Shift Events and Questions for Attending:    Plan: Vitals Q4H, DW, I/Os, encourage ambulation, monitor per unit protocol. Probable D/C in am.      (Braden Score: ?18 include skin assessment template)

## 2017-04-09 NOTE — Discharge Instr - AVS First Page (Addendum)
Reason for your Hospital Admission:  You came into the hospital because of an abnormal rhythm on your heart and taking too many diuretics.  You were evaluated by cardiology and electrophysiology.  You need to take your medications as prescribed.  You need to weight yourself every day and take double your dose of lasix if your weight increases by 2lbs in 1 day.  You need to call your cardiologist if your weight increases by 2lbs in a day or 5lbs in a week.  You need to follow-up with cardiology in 3-5 days and get your labs checked before your visit.

## 2017-04-09 NOTE — Discharge Summary (Signed)
MEDICINE DISCHARGE SUMMARY    Date Time: 04/09/17 6:02 PM  Patient Name: [name]  Attending Physician: No att. providers found  Primary Care Physician: Danford Bad, MD    Date of Admission: 04/05/2017  Date of Discharge: 04/09/17    Discharge Diagnoses:     Principal Diagnosis (Diagnosis after study, that is chiefly responsible for admission to inpatient status): V-tach  Active Hospital Problems    Diagnosis POA   . Principal Problem: V-tach Yes   . Ventricular tachycardia Yes   . Hypokalemia Yes   . ASD (atrial septal defect) Not Applicable   . Acquired hypothyroidism Yes   . Chronic systolic heart failure Yes   . PAH (pulmonary artery hypertension) Yes   . CHF (congestive heart failure) Yes   . AF (atrial fibrillation) Yes      Resolved Hospital Problems    Diagnosis POA   No resolved problems to display.       Disposition:      Home with family    Pending Results, Recommendations & Instructions to providers after discharge:     1. Micro / Labs / Path pending:   Unresulted Labs     None        2. Wound Care Instructions:   3. Date of completion for antibiotics or other medications:   4. Other:     Recent Labs - Last 2:         Recent Labs  Lab 04/09/17  0530 04/08/17  0336   WBC 5.67 5.72   Hgb 13.2 13.2   Hematocrit 42.3 41.7   Platelets 155 143         Recent Labs  Lab 04/05/17  1709   PT 18.3*   PT INR 1.5*   PTT 35       Recent Labs  Lab 04/05/17  1709   Alkaline Phosphatase 83   Bilirubin, Total 1.2   Protein, Total 6.5   Albumin 4.0   ALT 27   AST (SGOT) 34        Recent Labs  Lab 04/09/17  0530 04/08/17  0336 04/07/17  0342 04/06/17  0259 04/05/17  1709   Sodium 138 138 140 141 141   Potassium 4.8 4.8 4.0 4.0 3.4*   Chloride 104 104 104 102 99*   CO2 25 24 27 28  30*   BUN 23.0* 21.0* 23.0* 27.0* 25.0*   Glucose 104* 111* 97 97 90   Calcium 9.4 9.4 9.1 9.0 9.3   Magnesium  --  2.4 2.5 2.0 2.3   Phosphorus  --   --   --  4.2 4.0         Recent Labs  Lab 04/06/17  0259   Cholesterol 131    Triglycerides 137   HDL 51   LDL Calculated 53       Recent Labs  Lab 04/08/17  0336   TSH 1.97            Procedures/Radiology performed:   Radiology: all results from this admission  Xr Chest 2 Views    Result Date: 04/06/2017  1. Cardiomegaly. 2. Small bilateral pleural effusions. 3. Bibasilar atelectasis, left side greater than right, and mid right lung atelectasis. Collene Schlichter, MD 04/06/2017 3:55 PM       Hospital Course:     Reason for admission/ HPI:   [name] is a 83 y.o. female who presents to the hospital at the advice of  [place] heart office for AICD noted to have a run of V tac around midnight last night. Patient has been asymptomatic from this. She has however been not doing well as her Lasix dose was erroneously adjusted to 200mg  iat 2PM instead of 20mg  at [facility] as advised by MD due to the bottle was read as 5 pills instead of 0.5 pills. She took this extra dose in the last 2 days and had lost about 7 lbs in the last 7 days. Her Lasix dose was being uptitrated to due worsening LE edema and SOBOE over the last 3 weeks. She denies any CP or palpitations. No other symptoms reported. No light headedness or dizziness. She had the AICD/pacer placed after she was admitted post cardiac arrest in 05/2016. She has never had any ICD shocks. She stopped taking her Plavix at the advice of cardiology after ASD closure.        Hospital Course:     Cardiology was consulted; her ICD was interrogated that demonstrated persistent Afib and 2 episodes of NSVT noted.  On repeat TTE EF is 22%, RVSP , consistent w/ pulmonary htn, severe MR and TR.  Additionally she has some additional pacer spikes on her tele suggesting pacer dysynchrony; EP was consulted who suggested may ultimately require resynchronization w/ BiV ICD in order to treat her dyspnea.  Her QRS when not paced was 130.  First, however she will undergo amiodarone load and attempted cardioversion to see if atrial fibrillation is causing her dyspnea.   With regard to her MR general cardiology recommended against TEE to evaluate MR as she has had an ASD repair and is not a good candidate for a mitral clip procedure. She was discharged 1lb below her dry weight on a standing dose of torsemide 20mg  daily w/ instructions to follow-up closely w/ cardiology.    Note if any home meds were changed/discontinued and why      Discharge Day Exam:  Temp:  [97.4 F (36.3 C)-98.3 F (36.8 C)] 98.3 F (36.8 C)  Heart Rate:  [68-84] 75  Resp Rate:  [16-20] 20  BP: (99-106)/(58-77) 99/58    General: awake, alert X 3  Cardiovascular: irregular rate and rhythm, no murmurs, rubs or gallops  Lungs: crackles at bases.   Abdomen: soft, non-tender, non-distended; no palpable masses,  normoactive bowel sounds  Extremities: no edema. Strength equal bilaterally.    Wounds/decutibus ulcers/stage:    Consultations:         Discharge Condition:     Stable to home    Discharge Instructions & Follow Up Plan for Patient:     Diet:low sodium    Activity/Weight Bearing Status:      Patient was instructed to follow up with:     Follow-up Information     Lynford Humphrey, MD Follow up in 4 day(s).    Specialties:  Sleep Medicine, Cardiology  Contact information:  [address]  [address] [address]  [phone]             Danford Bad, MD Follow up.    Contact information:  [address] [place] [address]  [phone]             Ida Rogue, MD Follow up.    Specialties:  Clinical Cardiac Electrophysiology, Cardiology  Contact information:  [address]  [place] [address]  [phone]  Discharge Code Status:  Patient Emergency Contact:    Complete instructions and follow up are in the patient's After Visit Summary    Minutes spent coordinating discharge and reviewing discharge plan:35 minutes    Discharge Medications:        Discharge Medication List      Taking    acetaminophen 500 MG tablet  Dose:  500 mg  Commonly known as:  TYLENOL  Take 1  tablet (500 mg total) by mouth every 6 (six) hours as needed for Pain.     alendronate 70 MG tablet  Dose:  70 mg  Commonly known as:  FOSAMAX  Take 70 mg by mouth every 7 days.Take 1 tab by mouth every 7 days with a full glass of water on an empty stomach. Do not take anything else by mouth or lie down for 30 mins.     ALIGN 4 MG Caps  Take by mouth.Take as directed     amiodarone 200 MG tablet  Commonly known as:  PACERONE  Take 400mg  (2 tabs) twice a day for 2 weeks, then 200mg  (1 tab) twice a day for 1 week, then 200mg  (1 tab) a day for 1 week     ARNUITY ELLIPTA 100 MCG/ACT Aepb  Dose:  1 puff  Generic drug:  fluticasone furoate  Inhale 1 puff into the lungs as needed.     carvedilol 6.25 MG tablet  Dose:  6.25 mg  What changed:   medication strength   how much to take  Commonly known as:  COREG  Take 1 tablet (6.25 mg total) by mouth every 12 (twelve) hours.     CITRACAL + D PO  Dose:  2 tablet  Take 2 tablets by mouth daily.     CO-ENZYME Q-10 PO  Take by mouth daily.     gabapentin 100 MG capsule  Dose:  100 mg  Commonly known as:  NEURONTIN  Take 100 mg by mouth 3 (three) times daily.     levothyroxine 112 MCG tablet  Dose:  112 mcg  Commonly known as:  SYNTHROID, LEVOTHROID  Take 112 mcg by mouth Once a day at 6:00am.     losartan 25 MG tablet  Dose:  12.5 mg  Commonly known as:  COZAAR  Start taking on:  04/10/2017  Take 0.5 tablets (12.5 mg total) by mouth daily.     Magnesium 250 MG Tabs  Dose:  250 mg  Take 250 mg by mouth daily.     magnesium oxide 400 MG tablet  Dose:  200 mg  Commonly known as:  MAG-OX  Start taking on:  04/10/2017  Take 0.5 tablets (200 mg total) by mouth daily.     multivitamin tablet  Dose:  1 tablet  Take 1 tablet by mouth daily.     rivaroxaban 15 MG Tabs  Dose:  15 mg  What changed:   medication strength   See the new instructions.  Commonly known as:  XARELTO  Take 1 tablet (15 mg total) by mouth daily with dinner.     simvastatin 10 MG tablet  Dose:  10 mg  Commonly  known as:  ZOCOR  Take 10 mg by mouth nightly.     spironolactone 25 MG tablet  Dose:  12.5 mg  Commonly known as:  ALDACTONE  Start taking on:  04/10/2017  Take 0.5 tablets (12.5 mg total) by mouth daily.     torsemide 20 MG tablet  Dose:  20  mg  Commonly known as:  DEMADEX  Take 1 tablet (20 mg total) by mouth daily.     VITRON-C 65-125 MG Tabs  Dose:  1 tablet  Generic drug:  iron-vitamin C  Take 1 tablet by mouth every tues and thurs.        STOP taking these medications    clopidogrel 75 mg tablet  Commonly known as:  PLAVIX     furosemide 20 MG tablet  Commonly known as:  LASIX     potassium chloride 10 MEQ tablet  Commonly known as:  K-TAB, KLOR-CON              (FYI: you must refresh the link after final D/C Med reconciliation)    Immunizations provided:         [facility]  Department of Medicine  P: [phone]  F: [phone]    Signed by: [place] J Kerstie Agent, MD    CC: Danford Bad, MD

## 2017-04-09 NOTE — Progress Notes (Addendum)
Discharge Note:     Discharge location: Home  Home services/Report given to: N/A    Discharge instructions, follow up, and medications reviewed and explained to patient at bedside - questions answered stated understanding.     Prescriptions: Sent to pt's pharmacy      IV and tele removed. All belongings with patient. VSS.   Pt transported off of unit via wheelchair transport at 1500.

## 2017-04-09 NOTE — Plan of Care (Signed)
Shift Note:     Orientation: AOx4  Rhythm on tele: V-paced, A-Fib / ICD  Oxygen: RA  Ambulation: Standby assist   Pain: Pt denies any pain at this time  Lines/Drips: 18g R AC  GI/GU: Continent x2  Fall Score: Moderate    Critical Labs/Imaging/Procedures:  - 10/16: CXR- Cardiomegaly, small bilateral pleural effusions  - 10/17: BNP- 3,317    Comments:  - Blind in L eye  - Gluten intolerant (celiacs)  - V pacing at 50 bpm  - see doc flow for complete assessment and vitals.     Significant Shift Events and Questions for Attending:    Plan:   - Vitals Q4H  - Continue to monitor   - D/C to home with no needs    (Braden Score: ?18 include skin assessment template)

## 2017-04-14 ENCOUNTER — Telehealth: Payer: Self-pay

## 2017-04-14 NOTE — Telephone Encounter (Signed)
CHF DISCHARGE PHONE CALL:    Name of the person who participated on the phone call: Teresa Wienke    Do you have any questions or concerns about you discharge instructions:No, I think I'm taking torsemide now instead of furosemide and also aldactone.    Do you know why you were in the hospital? They said I took a lot of my furosemide like 200 mg instead of.5 mg.    Do you have any questions/concerns about your medications at discharge? No    Do you take your BP and HR at home? Yes    How was It?  same like what it was before, like 119/76      Do you have a scale? yes    Do you weigh yourself daily? Yes    Did you gain any weight since leaving the hospital? Not really, they have my weight at the hospital at 129 lbs, but at my home I'm 134 lbs and its been like that since I left the hospital.    Do you have any symptoms of HF right now? ( Like SOB?) she said she has SOB, but its been like that all the time even when she was at the hospital, so its not new.    Do you know what HF zone is? What is that again? Reviewed HF zone with patient again.    Are you following a low salt diet and limiting your fluid intake? Yes    Did you make an appointment with your cardiologist, PCP and other health specialist? I will see my cardiologist this Friday.    Arturo Morton RN, BSN, [facility]  Patient Care Navigator  Cardiac Telemetry Unit [place]  [phone]

## 2017-04-15 ENCOUNTER — Ambulatory Visit (INDEPENDENT_AMBULATORY_CARE_PROVIDER_SITE_OTHER): Payer: Self-pay | Admitting: Physician Assistant

## 2017-04-15 LAB — VAHRT HISTORIC LVEF: Ejection Fraction: 25 %

## 2017-04-21 ENCOUNTER — Ambulatory Visit (INDEPENDENT_AMBULATORY_CARE_PROVIDER_SITE_OTHER): Payer: Self-pay | Admitting: Nurse Practitioner

## 2017-04-22 DIAGNOSIS — I10 Essential (primary) hypertension: Secondary | ICD-10-CM

## 2017-04-22 HISTORY — DX: Essential (primary) hypertension: I10

## 2017-05-03 ENCOUNTER — Ambulatory Visit (INDEPENDENT_AMBULATORY_CARE_PROVIDER_SITE_OTHER): Payer: Self-pay | Admitting: Cardiology

## 2017-05-04 ENCOUNTER — Ambulatory Visit (INDEPENDENT_AMBULATORY_CARE_PROVIDER_SITE_OTHER): Payer: Self-pay

## 2017-05-04 ENCOUNTER — Other Ambulatory Visit (INDEPENDENT_AMBULATORY_CARE_PROVIDER_SITE_OTHER): Payer: Self-pay

## 2017-05-04 ENCOUNTER — Ambulatory Visit (INDEPENDENT_AMBULATORY_CARE_PROVIDER_SITE_OTHER): Payer: Self-pay | Admitting: Physician Assistant

## 2017-05-05 ENCOUNTER — Other Ambulatory Visit: Payer: Self-pay | Admitting: Clinical Cardiac Electrophysiology

## 2017-05-05 DIAGNOSIS — I480 Paroxysmal atrial fibrillation: Secondary | ICD-10-CM

## 2017-05-06 ENCOUNTER — Telehealth: Payer: Medicare Other

## 2017-05-06 NOTE — Pre-Procedure Instructions (Signed)
   Procedure verified CV 05/10/2017   Pt ID verified   Pt verbalized understanding of NPO instructions:  NPO after MN with sips of water to take am medications - read back   Arrival/Procedure time reviewed:  0945/1045 with read back   Procedure location:  Park in [employer] garage.  Take elevator to 1st Floor of [facility], Cardiac Diagnostics  With read back   Labs done 04/28/2017 in [facility] CBC BMP   Ride arranged with  Jonny Ruiz, husband [phone]   Unsteady gait: cane PRN   Pt on Xarelto; She denies any missed doses in the past 4 weeks.  Reinforced with pt to continue taking per her normal routine including day of procedure if applicable.      Has ICD:  Manufacturer:    St Jude    Pt instructed to bring wallet card day of procedure     TEE Questions:    Heartburn/Reflux - states controlled  Upper endoscopy -denies  Esophageal cancer -denies  Esophageal dilatation - denies  Difficulty swallowing -yes, large pills  Food stuck in throat -denies  Radiation to chest -denies

## 2017-05-10 ENCOUNTER — Ambulatory Visit: Payer: Medicare Other | Admitting: Anesthesiology

## 2017-05-10 ENCOUNTER — Ambulatory Visit
Admission: RE | Admit: 2017-05-10 | Discharge: 2017-05-10 | Disposition: A | Discharge status: Home / Self Care | Payer: Medicare Other | Source: Ambulatory Visit | Attending: Clinical Cardiac Electrophysiology | Admitting: Clinical Cardiac Electrophysiology

## 2017-05-10 ENCOUNTER — Ambulatory Visit
Admission: RE | Admit: 2017-05-10 | Discharge: 2017-05-10 | Disposition: A | Discharge status: Home / Self Care | Payer: Medicare Other | Source: Ambulatory Visit | Attending: Cardiovascular Disease | Admitting: Cardiovascular Disease

## 2017-05-10 DIAGNOSIS — I480 Paroxysmal atrial fibrillation: Secondary | ICD-10-CM | POA: Insufficient documentation

## 2017-05-10 DIAGNOSIS — Z95810 Presence of automatic (implantable) cardiac defibrillator: Secondary | ICD-10-CM | POA: Insufficient documentation

## 2017-05-10 DIAGNOSIS — J90 Pleural effusion, not elsewhere classified: Secondary | ICD-10-CM | POA: Insufficient documentation

## 2017-05-10 DIAGNOSIS — I484 Atypical atrial flutter: Secondary | ICD-10-CM | POA: Insufficient documentation

## 2017-05-10 DIAGNOSIS — Z7901 Long term (current) use of anticoagulants: Secondary | ICD-10-CM | POA: Insufficient documentation

## 2017-05-10 DIAGNOSIS — I509 Heart failure, unspecified: Secondary | ICD-10-CM | POA: Insufficient documentation

## 2017-05-10 DIAGNOSIS — R600 Localized edema: Secondary | ICD-10-CM | POA: Insufficient documentation

## 2017-05-10 HISTORY — DX: Cardiac arrest, cause unspecified: I46.9

## 2017-05-10 HISTORY — DX: Encounter for other specified aftercare: Z51.89

## 2017-05-10 HISTORY — DX: Heart failure, unspecified: I50.9

## 2017-05-10 HISTORY — DX: Presence of automatic (implantable) cardiac defibrillator: Z95.810

## 2017-05-10 LAB — ECG 12-LEAD
Atrial Rate: 214 {beats}/min
Atrial Rate: 69 {beats}/min
Atrial Rate: 60 {beats}/min
Atrial Rate: 65 {beats}/min
Q-T Interval: 460 ms
Q-T Interval: 472 ms
Q-T Interval: 496 ms
Q-T Interval: 498 ms
QRS Duration: 148 ms
QRS Duration: 158 ms
QRS Duration: 142 ms
QRS Duration: 154 ms
QTC Calculation (Bezet): 505 ms
QTC Calculation (Bezet): 510 ms
QTC Calculation (Bezet): 509 ms
QTC Calculation (Bezet): 511 ms
R Axis: -55 degrees
R Axis: -59 degrees
R Axis: -60 degrees
R Axis: -61 degrees
T Axis: 108 degrees
T Axis: 109 degrees
T Axis: 107 degrees
T Axis: 110 degrees
Ventricular Rate: 69 {beats}/min
Ventricular Rate: 74 {beats}/min
Ventricular Rate: 63 {beats}/min
Ventricular Rate: 64 {beats}/min

## 2017-05-10 LAB — GFR: EGFR: 42.9

## 2017-05-10 LAB — MAGNESIUM: Magnesium: 2.2 mg/dL (ref 1.6–2.6)

## 2017-05-10 LAB — BASIC METABOLIC PANEL
BUN: 25 mg/dL — ABNORMAL HIGH (ref 7.0–19.0)
CO2: 28 mEq/L (ref 22–29)
Calcium: 9 mg/dL (ref 7.9–10.2)
Chloride: 103 mEq/L (ref 100–111)
Creatinine: 1.2 mg/dL — ABNORMAL HIGH (ref 0.6–1.0)
Glucose: 87 mg/dL (ref 70–100)
Potassium: 4.2 mEq/L (ref 3.5–5.1)
Sodium: 140 mEq/L (ref 136–145)

## 2017-05-10 LAB — B-TYPE NATRIURETIC PEPTIDE: B-Natriuretic Peptide: 2082 pg/mL — ABNORMAL HIGH (ref 0–100)

## 2017-05-10 MED ORDER — TORSEMIDE 20 MG PO TABS
20.0000 mg | ORAL_TABLET | Freq: Every day | ORAL | 0 refills | Status: DC
Start: 2017-05-10 — End: 2017-06-23

## 2017-05-10 MED ORDER — PROPOFOL 10 MG/ML IV EMUL (WRAP)
INTRAVENOUS | Status: DC | PRN
Start: 2017-05-10 — End: 2017-05-10
  Administered 2017-05-10: 50 mg via INTRAVENOUS
  Administered 2017-05-10: 20 mg via INTRAVENOUS

## 2017-05-10 MED ORDER — PROPOFOL 10 MG/ML IV EMUL (WRAP)
INTRAVENOUS | Status: AC
Start: 2017-05-10 — End: ?
  Filled 2017-05-10: qty 20

## 2017-05-10 MED ORDER — PHENYLEPHRINE HCL 10 MG/ML IV SOLN (WRAP)
Status: DC | PRN
Start: 2017-05-10 — End: 2017-05-10
  Administered 2017-05-10: 50 ug via INTRAVENOUS

## 2017-05-10 NOTE — Discharge Instructions (Signed)
Post Anesthesia Discharge Instructions    Although you may be awake and alert in the recovery room, small amounts of anesthetic remain in your system for about 24 hours.  You may feel tired and sleepy during this time.      You are advised to go directly home from the hospital.    Plan to stay at home and rest for the remainder of the day.    It is advisable to have someone with you at home for 24 hours after surgery.    Do not operate a motor vehicle, or any mechanical or electrical equipment for the next 24 hours.      Be careful when you are walking around, you may become dizzy.  The effects of anesthesia and/or medications are still present and drowsiness may occur    Do not consume alcohol, tranquilizers, sleeping medications, or any other non prescribed medication for the remainder of the day.    Diet:  begin with liquids, progress your diet as tolerated or as directed by your surgeon.  Nausea and vomiting may occur in the next 24 hours.      Electrical Cardioversion    You had a cardioversion today. This is an electrical shock applied to the chest. The shockreset your heart rhythmback to normal. Your chest wall and chest muscles may feel sore for a few days. A bruise may appear on your chest. That will go away within a week.  To get ready for this procedure, you may have been given medicine to help you relax and to reduce pain. Depending on the medicine used,it could take up to8 hours for the effect to wear off.  Home care  Follow these guidelines when caring for yourself at home:   You should be watched by a responsible adult for the next 8 hours. This is in case your condition gets worse.   Don't take any oral medicine for pain or sleep during the next 4 hours. These medicines might react with the medicines you were given in the hospital. This can cause a much stronger response than usual.   Don't drinkanyalcohol for the next 24 hours.   Don'tdriveor operate dangerous machinery during the next  24 hours.   Your health care provider may have prescribed medicines to stop the abnormal heart rhythm from coming back. Take these medicines as directed.   You may use acetaminophen or ibuprofen to control pain, unless another pain medicine was prescribed. If you have chronic liver or kidney disease, talk with your provider before taking these medicines. Also talk with your provider if you've had a stomach ulcer or GI bleeding.  Follow-up care  Follow up with your health care provider, or as advised, if you aren't alert and back to your usual level of activity within 12 hours.  When to seek medical advice  Call your health care provider right away if any of these occur:   Weakness, dizziness, lightheadedness, or fainting   Pain in your chest, arm, shoulder, neck or upper back   Shortness of breath   Rapid heart rate (more than 120 beats per minute at rest)   You feel like your heart is fluttering or beating fast, hard, or irregularly (palpitations)   You have problems speaking or seeing   Weakness in an arm or leg   More than minor skin discomfort or redness where the cardioversion pads were placed  Date Last Reviewed: 05/15/2013   2000-2016 [employer] [address],   [place] [address]. All rights reserved. This information is not intended as a substitute for professional medical care. Always follow your healthcare professional's instructions.

## 2017-05-10 NOTE — Anesthesia Preprocedure Evaluation (Signed)
Anesthesia Evaluation    AIRWAY    Mallampati: II    TM distance: >3 FB  Neck ROM: full  Mouth Opening:full   CARDIOVASCULAR    cardiovascular exam normal, regular and normal       DENTAL    no notable dental hx     PULMONARY    pulmonary exam normal and clear to auscultation     OTHER FINDINGS              Relevant Problems   No relevant active problems               Anesthesia Plan    ASA 4     general                     intravenous induction   Detailed anesthesia plan: general IV      Post Op: other  Post op pain management: per surgeon    informed consent obtained      pertinent labs reviewed           ===============================================================  Inpatient Anesthesia Evaluation    Patient Name: [name],[name]  Surgeon: * Surgery not found *  Patient Age / Sex: 39 Young.o. / female    Medical History:     Past Medical History:   Diagnosis Date   . Abnormal vision     Dry eyes; marginal vision L eye   . Abnormal vision     Wears glasses   . Anemia     controlled on meds   . Aortic valve insufficiency    . Arrhythmia     Atrial fibrillation   . Arthritis     osteoarthritis, back and pelvis   . AVD (aortic valve disease)    . Benign heart murmur    . Bilateral cataracts     surgery, right eye   . Cardiac arrest     while on  Tikosyn   . Celiac disease    . Chronic obstructive pulmonary disease     mild-mod   . Congestive heart failure     chronic systolic   . Difficulty walking     Back pain; Uses cane   . Disease of lung    . Disorder of musculoskeletal system ?    ?   Marland Kitchen Dyspnea on exertion    . Ear, nose and throat disorder     Hoarseness   . Encounter for blood transfusion     last june 2018   . Encounter for cardioversion procedure 10/31/2013   . GERD (gastroesophageal reflux disease)     occasional , asymptomatic   . Headache     infrequently   . Heart murmur    . Hyperlipidemia     controlled with meds   . Hypertension 04/2017    controlled w/meds   . Hypothyroidism     controlled with meds    . ICD (implantable cardioverter-defibrillator) in place     St Jude 06/14/2016; VF arrest   . Low back pain    . Mitral valve insufficiency    . Mitral valve prolapse    . Orthopedic aftercare     scoliosis, stenosis, athritis   . Osteoporosis    . Osteoporosis    . Pacemaker 05/2016    Bring card [place]   . Pneumonia     had pneumonia shot 2010 and 2015   . Primary cardiomyopathy     DCM EF 22%  per Cath10/2018   . Scoliosis     Severe   . Shortness of breath    . Thyroid disease    . Vein disorder     bilateral legs       Past Surgical History:   Procedure Laterality Date   . ADENOIDECTOMY      as a child   . APPENDECTOMY      as a child   . ASD REPAIR  10/2016   . CARDIAC CATHETERIZATION  2007   . CARDIAC CATHETERIZATION  2014   . CARDIAC CATHETERIZATION  03/2016/10/2016;    . CARDIAC DEFIBRILLATOR PLACEMENT Left 06/14/2016    St Jude   . CARDIOVERSION  10/2013; 11/2013; 01/2015    x3 for A Fib , w/TEE   . CATARACT EXTRACTION W/ INTRAOCULAR LENS IMPLANT Right 2001   . CLOSED REDUCTION, INSERTION, HIP IM NAIL (GAMMA) Left 05/20/2016    Procedure: CLOSED REDUCTION, INSERTION, HIP IM NAIL (GAMMA);  Surgeon: Timoteo Ace, MD;  Location: [facility];  Service: Orthopedics;  Laterality: Left;   . COLONOSCOPY      "years ago"   . EGD, COLONOSCOPY N/A 12/09/2016    Procedure: EGD, COLONOSCOPY;  Surgeon: Annalee Genta, MD;  Location: [facility];  Service: Gastroenterology;  Laterality: N/A;   . INSERT / REPLACE / REMOVE PACEMAKER  05/25/2016    PM card dos   . JOINT REPLACEMENT Right 10-15-15     KNEE No Complications   . KNEE SURGERY Left 2002   . TONSILLECTOMY AND ADENOIDECTOMY           Allergies:     Allergies   Allergen Reactions   . Erythromycin Hives   . Gluten Meal Other (See Comments)     Has Celiac disease   . Tikosyn [Dofetilide]      VF arrest         Medications:     No current facility-administered medications for this encounter.               Prior to Admission medications    Medication Sig  Start Date End Date Taking? Authorizing Provider   acetaminophen (TYLENOL) 500 MG tablet Take 1 tablet (500 mg total) by mouth every 6 (six) hours as needed for Pain.  Patient taking differently: Take 500 mg by mouth 3 (three) times daily.     05/27/16  Yes Baker Pierini, MD   alendronate (FOSAMAX) 70 MG tablet Take 70 mg by mouth Once each week on Monday.Take 1 tab by mouth every 7 days with a full glass of water on an empty stomach. Do not take anything else by mouth or lie down for 30 mins.        Yes [provider]   amiodarone (PACERONE) 200 MG tablet Take 400mg  (2 tabs) twice a day for 2 weeks, then 200mg  (1 tab) twice a day for 1 week, then 200mg  (1 tab) a day for 1 week  Patient taking differently: Take 200 mg by mouth 2 (two) times daily.Take 400mg  (2 tabs) twice a day for 2 weeks, then 200mg  (1 tab) twice a day for 1 week, then 200mg  (1 tab) a day for 1 week     04/09/17  Yes Zik, [place] J, MD   Calcium Citrate-Vitamin D (CITRACAL + D PO) Take 2 tablets by mouth Daily after lunch.       Yes [provider]   carvedilol (COREG) 6.25 MG tablet Take 1 tablet (  6.25 mg total) by mouth every 12 (twelve) hours.  Patient taking differently: Take 12.5 mg by mouth every 12 (twelve) hours.     04/09/17  Yes Zik, [place] J, MD   CO-ENZYME Q-10 PO Take 100 mg by mouth daily.       Yes [provider]   fluticasone furoate (ARNUITY ELLIPTA) 100 MCG/ACT Aerosol Powder, Breath Activtivatede Inhale 1 puff into the lungs as needed.       Yes [provider]   gabapentin (NEURONTIN) 100 MG capsule Take 100 mg by mouth 3 (three) times daily.   Yes [provider]   iron-vitamin C (VITRON-C) 65-125 MG Tab Take 1 tablet by mouth every tues and thurs.   Yes [provider]   levothyroxine (SYNTHROID, LEVOTHROID) 112 MCG tablet Take 112 mcg by mouth Once a day at 6:00am.   Yes [provider]   losartan (COZAAR) 25 MG tablet Take 0.5 tablets (12.5 mg total) by mouth  daily.  Patient taking differently: Take 12.5 mg by mouth every morning.     04/10/17  Yes Zik, [place] J, MD   magnesium oxide (MAG-OX) 400 MG tablet Take 0.5 tablets (200 mg total) by mouth daily.  Patient taking differently: Take 200 mg by mouth every morning.     04/10/17  Yes Zik, [place] J, MD   Multiple Vitamin (MULTIVITAMIN) tablet Take 1 tablet by mouth Daily after lunch.       Yes [provider]   Probiotic Product (ALIGN) 4 MG Cap Take by mouth every morning.Take as directed       Yes [provider]   rivaroxaban (XARELTO) 15 MG Tab Take 1 tablet (15 mg total) by mouth daily with dinner. 04/09/17  Yes Zik, [place] J, MD   simvastatin (ZOCOR) 10 MG tablet Take 10 mg by mouth nightly.   Yes [provider]   spironolactone (ALDACTONE) 25 MG tablet Take 0.5 tablets (12.5 mg total) by mouth daily.  Patient taking differently: Take 12.5 mg by mouth every morning.     04/10/17  Yes Zik, [place] J, MD   torsemide (DEMADEX) 20 MG tablet Take 1 tablet (20 mg total) by mouth daily.  Patient taking differently: Take 20 mg by mouth every morning.     04/09/17  Yes Zik, [place] J, MD     Vitals   Temp:  [36.3 C (97.4 F)] 36.3 C (97.4 F)  Heart Rate:  [65] 65  Resp Rate:  [18] 18  BP: (90)/(60) 90/60    Wt Readings from Last 3 Encounters:   05/06/17 59.9 kg (132 lb)   04/08/17 58.8 kg (129 lb 11.2 oz)   12/11/16 58.4 kg (128 lb 12.8 oz)     BMI (Estimated body mass index is 23.38 kg/m as calculated from the following:    Height as of this encounter: 1.6 m (5\' 3" ).    Weight as of this encounter: 59.9 kg (132 lb).)  Temp Readings from Last 3 Encounters:   05/10/17 36.3 C (97.4 F) (Axillary)   04/09/17 36.8 C (98.3 F) (Oral)   12/11/16 36.6 C (97.9 F) (Oral)     BP Readings from Last 3 Encounters:   05/10/17 90/60   04/09/17 99/58   12/11/16 103/64     Pulse Readings from Last 3 Encounters:   05/10/17 65   04/09/17 75   12/11/16 81           Labs:   CBC:  Lab Results  Component Value Date    WBC  5.67 04/09/2017    HGB 13.2 04/09/2017    HCT 42.3 04/09/2017    PLT 155 04/09/2017       Chemistries:  Lab Results   Component Value Date    NA 138 04/09/2017    K 4.8 04/09/2017    CL 104 04/09/2017    CO2 25 04/09/2017    BUN 23.0 (H) 04/09/2017    CREAT 1.1 (H) 04/09/2017    GLU 104 (H) 04/09/2017    CA 9.4 04/09/2017    AST 34 04/05/2017       Coags:  Lab Results   Component Value Date    PT 18.3 (H) 04/05/2017    PTT 35 04/05/2017    INR 1.5 (H) 04/05/2017     _____________________      Signed by: Leslie Dales  05/10/17   10:45 AM    =============================================================      Signed by: Leslie Dales 05/10/17 10:44 AM

## 2017-05-10 NOTE — Anesthesia Postprocedure Evaluation (Signed)
Anesthesia Post Evaluation    Patient: [name]    * No procedures listed *    Anesthesia type: General TIVA    Last Vitals:   Vitals:    05/10/17 1035   BP: 90/60   Pulse: 65   Resp: 18   Temp: 36.3 C (97.4 F)   SpO2: 98%       Patient Location: Phase I PACU      Post Pain: Patient not complaining of pain, continue current therapy    Mental Status: awake    Respiratory Function: tolerating room air    Cardiovascular: stable    Nausea/Vomiting: patient not complaining of nausea or vomiting    Hydration Status: adequate    Post Assessment: no apparent anesthetic complications, no reportable events and no evidence of recall          Anesthesia Qualified Clinical Data Registry 2018    PACU Reintubation  Did the Patient have general anesthesia with intubation: Yes  Did the Patient require reintubation in the PACU?: No  Was this a planned exubation trial (documented in the medical record)?: No    PONV Adult  Is the patient aged 45 or older: Yes  Did the patient receive recieve a general anesthestic: Yes  Does the patient have 3 or more risk factors for PONV? No        PONV Pediatric  Is the patient aged 74-17? No            PACU Transfer Checklist Protocol  Was the patient transferred to the PACU at the conclusion of surgery? Yes  Was a checklist or transfer protocol used? Yes    ICU Transfer Checklist Protocol  Was the patient transferred to the ICU at the conclusion of surgery? No      Post-op Pain Assessment Prior to Anesthesia Care End  Age >=18 and assessed for pain in PACU: Yes  Pacu pain score <7/10: Yes      Perioperative Mortality  Perioperative mortality prior to Anesthesia end time: No    Perioperative Cardiac Arrest  Did the patient have an unanticipated intraoperative cardiac arrest between anesthesia start time and anesthesia end time? No    Unplanned Admission to ICU  Did the patient have an unplanned admission to the ICU (not initially anticipated at anesthesia start time)? No      Signed by:  Leslie Dales, 05/10/2017 11:10 AM

## 2017-05-10 NOTE — Transfer of Care (Signed)
Anesthesia Transfer of Care Note    Patient: [name]    Procedures performed: * No procedures listed *    Anesthesia type: General TIVA    Patient location:PACU    Last vitals:   Vitals:    05/10/17 1035   BP: 90/60   Pulse: 65   Resp: 18   Temp: 36.3 C (97.4 F)   SpO2: 98%       Post pain: Patient not complaining of pain, continue current therapy      Mental Status:awake and alert     Respiratory Function: tolerating room air    Cardiovascular: stable    Nausea/Vomiting: patient not complaining of nausea or vomiting    Hydration Status: adequate    Post assessment: no apparent anesthetic complications, no reportable events and no evidence of recall    Signed by: Leslie Dales  05/10/17 11:08 AM

## 2017-05-10 NOTE — Procedures (Signed)
PRE-PROCEDURE:  Informed consent signed. Confirmed faithful anticoagulation with Xarelto with no missed doses in 30 days.    PROCEDURE:  Internal synchronized cardioversion x3 attempts with escalating doses 10J, 20J, 36J by her defibrillator.  Persistent atrial tachycardia/flutter on internal cardiogram.    POST-PROCEDURE: Patient awoke free of neurologic complaints    RECOMMENDATIONS:  Patient has significant edema, JVD/v waves with CM and known severe MR/TR. Recommend BNP, CXR and BMP Mg (rising creatinine on 11-7 to 11-8 and frequent ectopy on ECG).    Will adjust diuretics and plan close HF follow up in our office within 48 hours before the holiday. Re-attempt cardioversoin once deemed euvolemic in follow up.

## 2017-05-10 NOTE — Plan of Care (Signed)
The history and physical currently available in the chart has been reviewed and there are no significant interval changes since prior evaluation.  She has no complaints.  She was seen and examined by me prior to the procedure.  The risks, benefits and alternatives of the procedure have been discussed in detail and she has indicated that she understands the procedure, indications, and risks inherent to the procedure and is amenable to proceeding.  All questions were answered. Informed consent was signed and verified.      Elden Brucato E Pinell-Salles, MD, FACC

## 2017-05-10 NOTE — Progress Notes (Signed)
Patient received to Cardiac Diagnostics for CV procedure by Dr.Pinel Talbert Forest.    Atrial Fibrillation.    Pre procedure, Patient alert, oriented, normal speech, no focal findings or movement disorder noted.  Patient is accompanied by her spouse.    Patient noted to have +4 BLE Edema.  Post procedure orders received from Dr.  Adline Potter for procedure(s) obtained and placed on chart: Yes.        For CARDIOVERSION:  Patient admits to taking anticoagulation and has not missed any doses: Yes.  Xarelto  Pre procedure ECG performed per protocol and placed on chart:  Rhythm:  Atrial Tachycardia, Rate:  Tachycardic  Confirmed by [employer] Jude Rep.    Intra-Procedure,   Patient has an AICD,   Patient received 10J internal synchronized shock x  1  Successfully Cardioverted:  No  Patient received 20J internal synchronized shock x  1  Successfully Cardioverted:  no  Patient received 36J internal synchronized shock x  1  Successfully Cardioverted:  No  Post procedure ECG performed per protocol EKG:  Atrial Tachycardia.    Patches removed, SKIN: no lesions, jaundice, petechiae, pallor, cyanosis, ecchymosis.      Procedure complete.  Patient tolerated procedure well.  Patient: alert, oriented, normal speech, no focal findings or movement disorder noted.    Labs drawn and sent per Dr. Fidel Levy.    Dr.  spoke with patient and spouse re: procedure and results.    CXR done per order    Discharge instructions verbally reviewed.  Per Dr. Marolyn Hammock, there  are changes to current medications: additional Torsemide doses. Patient to follow up in 2 days.  Patient verbalized understanding of instructions.     Patient discharged to home, escorted to lobby via wheelchair.

## 2017-05-11 ENCOUNTER — Telehealth: Payer: Self-pay | Admitting: Cardiovascular Disease

## 2017-05-11 NOTE — Telephone Encounter (Signed)
Denies any post procedure problems.  Has appt scheduled with   [facility] tomorrow.

## 2017-05-12 ENCOUNTER — Ambulatory Visit (INDEPENDENT_AMBULATORY_CARE_PROVIDER_SITE_OTHER): Payer: Self-pay | Admitting: Cardiovascular Disease

## 2017-05-18 ENCOUNTER — Other Ambulatory Visit: Payer: Self-pay | Admitting: Cardiovascular Disease

## 2017-05-18 DIAGNOSIS — I340 Nonrheumatic mitral (valve) insufficiency: Secondary | ICD-10-CM

## 2017-05-18 DIAGNOSIS — I480 Paroxysmal atrial fibrillation: Secondary | ICD-10-CM

## 2017-05-26 NOTE — Pre-Procedure Instructions (Signed)
Procedure Verified   Pt ID verified  Nothing to eat and drink after midnight, w read back  Reinforced medication instructions by MD, may take morning meds w sips of water. Arrival time at_0930 per MD office__ w read back, parking at gray [address], take the elevator to First floor and turn left, check in at [facility]  Ride arranged with _husband John__  Pt instructed to bring medication list          Pt stated no change in health/medications since last procedure  No recent illness/bug bites  Denied need for assistance for husband or self for hearing/vision/ambulation  To call MD/[facility] for medication instructions                TEE Questions:(note when, where, result, GI MD, etc)    Heartburn/Reflux - denies  Upper endoscopy - yes 11/2016 at [facility] Dr. Dorian Pod  Esophageal cancer - denies  Esophageal dilatation -denies  Difficulty swallowing - large pills  Food stuck in throat -denies  Radiation to chest - denies

## 2017-05-26 NOTE — Anesthesia Preprocedure Evaluation (Addendum)
Anesthesia Evaluation    AIRWAY    Mallampati: II    TM distance: >3 FB  Neck ROM: full  Mouth Opening:full   CARDIOVASCULAR    cardiovascular exam normal, regular and normal       DENTAL    no notable dental hx     PULMONARY    pulmonary exam normal and clear to auscultation     OTHER FINDINGS              Relevant Problems   No relevant active problems               Anesthesia Plan    ASA 4     general                     intravenous induction   Detailed anesthesia plan: general IV      Post Op: other  Post op pain management: per surgeon    informed consent obtained      pertinent labs reviewed             ===============================================================  Inpatient Anesthesia Evaluation    Patient Name: [name],[name]  Surgeon: * Surgery not found *  Patient Age / Sex: 83 Young.o. / female    Medical History:     Past Medical History:   Diagnosis Date   . Abnormal vision     Dry eyes; marginal vision L eye   . Abnormal vision     Wears glasses   . Anemia     controlled on meds   . Aortic valve insufficiency    . Arrhythmia     Atrial fibrillation   . Arthritis     osteoarthritis, back and pelvis   . AVD (aortic valve disease)    . Benign heart murmur    . Bilateral cataracts     surgery, right eye   . Cardiac arrest     while on  Tikosyn   . Celiac disease    . Chronic obstructive pulmonary disease     mild-mod   . Congestive heart failure     chronic systolic   . Difficulty walking     Back pain; Uses cane   . Disease of lung    . Disorder of musculoskeletal system ?    ?   Marland Kitchen Dyspnea on exertion    . Ear, nose and throat disorder     Hoarseness   . Encounter for blood transfusion     last june 2018   . Encounter for cardioversion procedure 10/31/2013   . GERD (gastroesophageal reflux disease)     occasional , asymptomatic   . Headache     infrequently   . Heart murmur    . Hyperlipidemia     controlled with meds   . Hypertension 04/2017    controlled w/meds   . Hypothyroidism     controlled with  meds   . ICD (implantable cardioverter-defibrillator) in place     St Jude 06/14/2016; VF arrest   . Low back pain    . Mitral valve insufficiency    . Mitral valve prolapse    . Orthopedic aftercare     scoliosis, stenosis, athritis   . Osteoporosis    . Osteoporosis    . Pacemaker 05/2016    Bring card [place]   . Pneumonia     had pneumonia shot 2010 and 2015   . Primary cardiomyopathy     DCM  EF 22% per Cath10/2018   . Scoliosis     Severe   . Shortness of breath    . Thyroid disease    . Vein disorder     bilateral legs       Past Surgical History:   Procedure Laterality Date   . ADENOIDECTOMY      as a child   . APPENDECTOMY      as a child   . ASD REPAIR  10/2016   . CARDIAC CATHETERIZATION  2007   . CARDIAC CATHETERIZATION  2014   . CARDIAC CATHETERIZATION  03/2016/10/2016;    . CARDIAC DEFIBRILLATOR PLACEMENT Left 06/14/2016    St Jude   . CARDIOVERSION  10/2013; 11/2013; 01/2015    x3 for A Fib , w/TEE   . CARDIOVERSION INTERNAL  05/10/2017        . CATARACT EXTRACTION W/ INTRAOCULAR LENS IMPLANT Right 2001   . CLOSED REDUCTION, INSERTION, HIP IM NAIL (GAMMA) Left 05/20/2016    Procedure: CLOSED REDUCTION, INSERTION, HIP IM NAIL (GAMMA);  Surgeon: Timoteo Ace, MD;  Location: [facility];  Service: Orthopedics;  Laterality: Left;   . COLONOSCOPY      "years ago"   . EGD, COLONOSCOPY N/A 12/09/2016    Procedure: EGD, COLONOSCOPY;  Surgeon: Annalee Genta, MD;  Location: [facility];  Service: Gastroenterology;  Laterality: N/A;   . INSERT / REPLACE / REMOVE PACEMAKER  05/25/2016    PM card dos   . JOINT REPLACEMENT Right 10-15-15     KNEE No Complications   . KNEE SURGERY Left 2002   . TONSILLECTOMY AND ADENOIDECTOMY           Allergies:     Allergies   Allergen Reactions   . Erythromycin Hives   . Gluten Meal Other (See Comments)     Has Celiac disease   . Tikosyn [Dofetilide]      VF arrest         Medications:     No current facility-administered medications for this visit.               Prior to  Admission medications    Medication Sig Start Date End Date Taking? Authorizing Provider   acetaminophen (TYLENOL) 500 MG tablet Take 1 tablet (500 mg total) by mouth every 6 (six) hours as needed for Pain.  Patient taking differently: Take 500 mg by mouth 3 (three) times daily.     05/27/16  Yes Baker Pierini, MD   alendronate (FOSAMAX) 70 MG tablet Take 70 mg by mouth Once each week on Monday.Take 1 tab by mouth every 7 days with a full glass of water on an empty stomach. Do not take anything else by mouth or lie down for 30 mins.        Yes [provider]   amiodarone (PACERONE) 200 MG tablet Take 400mg  (2 tabs) twice a day for 2 weeks, then 200mg  (1 tab) twice a day for 1 week, then 200mg  (1 tab) a day for 1 week  Patient taking differently: Take 200 mg by mouth 2 (two) times daily.Take 400mg  (2 tabs) twice a day for 2 weeks, then 200mg  (1 tab) twice a day for 1 week, then 200mg  (1 tab) a day for 1 week     04/09/17  Yes Zik, [place] J, MD   Calcium Citrate-Vitamin D (CITRACAL + D PO) Take 2 tablets by mouth Daily after lunch.  Yes [provider]   carvedilol (COREG) 6.25 MG tablet Take 1 tablet (6.25 mg total) by mouth every 12 (twelve) hours.  Patient taking differently: Take 12.5 mg by mouth every 12 (twelve) hours.     04/09/17  Yes Zik, [place] J, MD   CO-ENZYME Q-10 PO Take 100 mg by mouth daily.       Yes [provider]   fluticasone furoate (ARNUITY ELLIPTA) 100 MCG/ACT Aerosol Powder, Breath Activtivatede Inhale 1 puff into the lungs as needed.       Yes [provider]   gabapentin (NEURONTIN) 100 MG capsule Take 100 mg by mouth 3 (three) times daily.   Yes [provider]   iron-vitamin C (VITRON-C) 65-125 MG Tab Take 1 tablet by mouth every tues and thurs.   Yes [provider]   levothyroxine (SYNTHROID, LEVOTHROID) 112 MCG tablet Take 112 mcg by mouth Once a day at 6:00am.   Yes [provider]   losartan (COZAAR) 25 MG tablet Take  0.5 tablets (12.5 mg total) by mouth daily.  Patient taking differently: Take 12.5 mg by mouth every morning.     04/10/17  Yes Zik, [place] J, MD   magnesium oxide (MAG-OX) 400 MG tablet Take 0.5 tablets (200 mg total) by mouth daily.  Patient taking differently: Take 200 mg by mouth every morning.     04/10/17  Yes Zik, [place] J, MD   Multiple Vitamin (MULTIVITAMIN) tablet Take 1 tablet by mouth Daily after lunch.       Yes [provider]   Probiotic Product (ALIGN) 4 MG Cap Take by mouth every morning.Take as directed       Yes [provider]   rivaroxaban (XARELTO) 15 MG Tab Take 1 tablet (15 mg total) by mouth daily with dinner. 04/09/17  Yes Zik, [place] J, MD   simvastatin (ZOCOR) 10 MG tablet Take 10 mg by mouth nightly.   Yes [provider]   spironolactone (ALDACTONE) 25 MG tablet Take 0.5 tablets (12.5 mg total) by mouth daily.  Patient taking differently: Take 12.5 mg by mouth every morning.     04/10/17  Yes Zik, [place] J, MD   torsemide (DEMADEX) 20 MG tablet Take 1 tablet (20 mg total) by mouth daily.  Patient taking differently: Take 20 mg by mouth every morning.     04/09/17  Yes Zik, [place] J, MD     Vitals        Wt Readings from Last 3 Encounters:   05/10/17 61.7 kg (136 lb)   04/08/17 58.8 kg (129 lb 11.2 oz)   12/11/16 58.4 kg (128 lb 12.8 oz)     BMI (Estimated body mass index is 24.09 kg/m as calculated from the following:    Height as of 05/06/17: 1.6 m (5\' 3" ).    Weight as of 05/10/17: 61.7 kg (136 lb).)  Temp Readings from Last 3 Encounters:   05/10/17 36.3 C (97.4 F) (Axillary)   04/09/17 36.8 C (98.3 F) (Oral)   12/11/16 36.6 C (97.9 F) (Oral)     BP Readings from Last 3 Encounters:   05/10/17 135/57   04/09/17 99/58   12/11/16 103/64     Pulse Readings from Last 3 Encounters:   05/10/17 81   04/09/17 75   12/11/16 81           Labs:   CBC:  Lab Results   Component Value Date    WBC 5.67 04/09/2017  HGB 13.2 04/09/2017    HCT 42.3 04/09/2017    PLT 155  04/09/2017       Chemistries:  Lab Results   Component Value Date    NA 140 05/10/2017    K 4.2 05/10/2017    CL 103 05/10/2017    CO2 28 05/10/2017    BUN 25.0 (H) 05/10/2017    CREAT 1.2 (H) 05/10/2017    GLU 87 05/10/2017    CA 9.0 05/10/2017    AST 34 04/05/2017       Coags:  Lab Results   Component Value Date    PT 18.3 (H) 04/05/2017    PTT 35 04/05/2017    INR 1.5 (H) 04/05/2017     _____________________      Signed by: Comer Locket  05/26/17   1:18 PM    =============================================================      Signed by: Comer Locket 05/26/17 1:18 PM

## 2017-05-27 ENCOUNTER — Ambulatory Visit: Payer: Medicare Other | Admitting: Anesthesiology

## 2017-05-27 ENCOUNTER — Ambulatory Visit
Admission: RE | Admit: 2017-05-27 | Discharge: 2017-05-27 | Disposition: A | Discharge status: Home / Self Care | Payer: Medicare Other | Source: Ambulatory Visit | Attending: Cardiovascular Disease | Admitting: Cardiovascular Disease

## 2017-05-27 DIAGNOSIS — I509 Heart failure, unspecified: Secondary | ICD-10-CM | POA: Insufficient documentation

## 2017-05-27 DIAGNOSIS — Z8674 Personal history of sudden cardiac arrest: Secondary | ICD-10-CM | POA: Insufficient documentation

## 2017-05-27 DIAGNOSIS — I5189 Other ill-defined heart diseases: Secondary | ICD-10-CM | POA: Insufficient documentation

## 2017-05-27 DIAGNOSIS — I420 Dilated cardiomyopathy: Secondary | ICD-10-CM | POA: Insufficient documentation

## 2017-05-27 DIAGNOSIS — I081 Rheumatic disorders of both mitral and tricuspid valves: Secondary | ICD-10-CM | POA: Insufficient documentation

## 2017-05-27 DIAGNOSIS — I340 Nonrheumatic mitral (valve) insufficiency: Secondary | ICD-10-CM

## 2017-05-27 DIAGNOSIS — Z95818 Presence of other cardiac implants and grafts: Secondary | ICD-10-CM | POA: Insufficient documentation

## 2017-05-27 DIAGNOSIS — I480 Paroxysmal atrial fibrillation: Secondary | ICD-10-CM | POA: Insufficient documentation

## 2017-05-27 MED ORDER — PHENYLEPHRINE 100 MCG/ML IN NACL 0.9% IV SOSY
PREFILLED_SYRINGE | INTRAVENOUS | Status: AC
Start: 2017-05-27 — End: ?
  Filled 2017-05-27: qty 5

## 2017-05-27 MED ORDER — BENZOCAINE 20% MT SOLN (WRAP)
OROMUCOSAL | Status: DC | PRN
Start: 2017-05-27 — End: 2017-05-27
  Administered 2017-05-27: 1 via OROMUCOSAL

## 2017-05-27 MED ORDER — GLYCOPYRROLATE 0.2 MG/ML IJ SOLN
INTRAMUSCULAR | Status: AC
Start: 2017-05-27 — End: ?
  Filled 2017-05-27: qty 1

## 2017-05-27 MED ORDER — PROPOFOL INFUSION 10 MG/ML
INTRAVENOUS | Status: DC | PRN
Start: 2017-05-27 — End: 2017-05-27
  Administered 2017-05-27: 30 mg via INTRAVENOUS
  Administered 2017-05-27: 80 mg via INTRAVENOUS

## 2017-05-27 MED ORDER — PROPOFOL INFUSION 10 MG/ML
INTRAVENOUS | Status: DC | PRN
Start: 2017-05-27 — End: 2017-05-27
  Administered 2017-05-27: 100 ug/kg/min via INTRAVENOUS

## 2017-05-27 MED ORDER — GLYCOPYRROLATE 0.2 MG/ML IJ SOLN
INTRAMUSCULAR | Status: DC | PRN
Start: 2017-05-27 — End: 2017-05-27
  Administered 2017-05-27: 200 ug via INTRAVENOUS

## 2017-05-27 MED ORDER — SODIUM CHLORIDE 0.9 % IV SOLN
INTRAVENOUS | Status: DC | PRN
Start: 2017-05-27 — End: 2017-05-27

## 2017-05-27 MED ORDER — PROPOFOL 10 MG/ML IV EMUL (WRAP)
INTRAVENOUS | Status: AC
Start: 2017-05-27 — End: ?
  Filled 2017-05-27: qty 50

## 2017-05-27 MED ORDER — LIDOCAINE HCL (PF) 2 % IJ SOLN
INTRAMUSCULAR | Status: AC
Start: 2017-05-27 — End: ?
  Filled 2017-05-27: qty 5

## 2017-05-27 MED ORDER — LIDOCAINE HCL 2 % IJ SOLN
INTRAMUSCULAR | Status: DC | PRN
Start: 2017-05-27 — End: 2017-05-27
  Administered 2017-05-27: 50 mg via INTRAVENOUS

## 2017-05-27 MED ORDER — PHENYLEPHRINE HCL 10 MG/ML IV SOLN (WRAP)
Status: DC | PRN
Start: 2017-05-27 — End: 2017-05-27
  Administered 2017-05-27: 150 ug via INTRAVENOUS
  Administered 2017-05-27: 250 ug via INTRAVENOUS

## 2017-05-27 NOTE — Transfer of Care (Signed)
Anesthesia Transfer of Care Note    Patient: [name]    Procedures performed: TEE    Anesthesia type: General TIVA    Patient location:Phase I PACU    Last vitals:   Vitals:    05/27/17 1001   BP: 125/65   Pulse: 69   Resp: 12   Temp: 36.6 C (97.9 F)   SpO2: 97%       Post pain: Patient not complaining of pain, continue current therapy      Mental Status:sedated    Respiratory Function: tolerating nasal cannula    Cardiovascular: stable    Nausea/Vomiting: patient not complaining of nausea or vomiting    Hydration Status: adequate    Post assessment: no apparent anesthetic complications, no reportable events and no evidence of recall    Signed by: Comer Locket  05/27/17 11:25 AM

## 2017-05-27 NOTE — Progress Notes (Signed)
Patient received from [facility] for TEE procedure by Dr.Maghsoudi. Evaluate for Mitral clip.    Severe MR, Svere TR, low EF ~20%, ICD (2017) ASD closure.    Pre procedure, Patient alert, oriented, normal speech, no focal findings or movement disorder noted.  Patient is accompanied by her spouse.  Consents for procedure(s) obtained and placed on chart: Yes.      For TEE, patient placed in left lateral decubitous  position.   Throat is sprayed 10:54  Probe inserted @ 11:02  Bubble Study performed: Yes.    Probe removed @ 11:25    Procedure complete.  Patient tolerated procedure well.  Patient: alert, oriented, normal speech, no focal findings or movement disorder noted.  Dr.  spoke with patient re: procedure and results.      Discharge instructions verbally reviewed.  Per Dr. Shanon Ace, there  are no changes to current medications.  There is Moderate Mitral Regurgitation.  Patient to follow up Dr. Garlan Fair.  Patient verbalized understanding of instructions.     Patient discharged to home, escorted to lobby via wheelchair.

## 2017-05-27 NOTE — Anesthesia Postprocedure Evaluation (Signed)
Anesthesia Post Evaluation    Patient: [name]    TEE    Anesthesia type: General TIVA    Last Vitals:   Vitals:    05/27/17 1205   BP:    Pulse:    Resp:    Temp:    SpO2: 93%       Patient Location: Phase I PACU      Post Pain: Patient not complaining of pain, continue current therapy    Mental Status: awake    Respiratory Function: tolerating room air    Cardiovascular: stable    Nausea/Vomiting: patient not complaining of nausea or vomiting      Post Assessment: no apparent anesthetic complications, no reportable events and no evidence of recall          Anesthesia Qualified Clinical Data Registry 2018    PACU Reintubation  Did the Patient have general anesthesia with intubation: No        PONV Adult  Is the patient aged 81 or older: Yes  Did the patient receive recieve a general anesthestic: Yes  Does the patient have 3 or more risk factors for PONV? No  Did the patient receive anti-emetics from at least two classes of medications? No  Was there a medical reason for not administering anti-emetics? Yes    PONV Pediatric  Is the patient aged 66-17? No            PACU Transfer Checklist Protocol  Was the patient transferred to the PACU at the conclusion of surgery? Yes  Was a checklist or transfer protocol used? Yes    ICU Transfer Checklist Protocol  Was the patient transferred to the ICU at the conclusion of surgery? No      Post-op Pain Assessment Prior to Anesthesia Care End  Age >=18 and assessed for pain in PACU: Yes  Pacu pain score <7/10: Yes      Perioperative Mortality  Perioperative mortality prior to Anesthesia end time: No    Perioperative Cardiac Arrest  Did the patient have an unanticipated intraoperative cardiac arrest between anesthesia start time and anesthesia end time? No    Unplanned Admission to ICU  Did the patient have an unplanned admission to the ICU (not initially anticipated at anesthesia start time)? No      Signed by: Comer Locket, 05/27/2017 12:58 PM

## 2017-05-27 NOTE — Discharge Instructions (Signed)
Post Anesthesia Discharge Instructions    Although you may be awake and alert in the recovery room, small amounts of anesthetic remain in your system for about 24 hours.  You may feel tired and sleepy during this time.      You are advised to go directly home from the hospital.    Plan to stay at home and rest for the remainder of the day.    It is advisable to have someone with you at home for 24 hours after surgery.    Do not operate a motor vehicle, or any mechanical or electrical equipment for the next 24 hours.      Be careful when you are walking around, you may become dizzy.  The effects of anesthesia and/or medications are still present and drowsiness may occur    Do not consume alcohol, tranquilizers, sleeping medications, or any other non prescribed medication for the remainder of the day.    Diet:  begin with liquids, progress your diet as tolerated or as directed by your surgeon.  Nausea and vomiting may occur in the next 24 hours.        Transesophageal Echocardiography (TEE)    Transesophageal echocardiography (TEE) is a test done to record images of your heart with a probe inside your esophagus. These images help your healthcare provider find and treat problems such as infection, disease, or defects in your heart's function, walls or valves. This test may be done when a chest echocardiogram (transthoracic) does not give your provider enough information.  Before your test   Tell your provider about allthe medicines you take. Ask if it'sOK to take them before the test.   Don't eat or drink for6 to8hours before the test. This includes water.   Tell your healthcare provider if you have ulcers, a hiatal hernia, or problems swallowing. Also report a history of narrowing of the esophagus, or any other previous gastrointestinal problems. Also, let him or her know of any allergies to medicines or sedatives.   Also let your provider know if you have dental implants or dentures that should be removed  before the test.   Arrange to have someonedrive youhome after the exam.  During your TEE   When you arrive for your TEE, you will change into a hospital gown, and then be taken to the testing room.   Your provider will spray your throat with a numbing medicine. You may be given a medicine through an IV (intravenous) in your arm to help you relax. You may also be given oxygen. Then you'll be asked to lie on your left side.   The healthcare provider gently inserts the small, lubricated probe into your mouth. As you swallow, he or she will slowly guide the tube into your esophagus.   You may feel the healthcare provider moving the probe, but it shouldn't hurt or interfere with your breathing. A nurse checks your heart rate, blood pressure, and breathing.The test usually takes 20 to 40 minutes.   The nurse or assistant will suction any saliva out of your mouth, similar to when you have a dental cleaning.  After the test   Tell your healthcare provider about any pain, or if you cough up or vomit blood, or have trouble swallowing.   You can eatand drink again when your throat is no longer numb.   Do not drive a car or run heavy machinery for at least 24 hours after getting sedation. After 24 hours you can return to   normal activity unless your healthcare provider tells you otherwise.   Be sure to keep your follow-up appointment to go over the results with your healthcare provider.    Date Last Reviewed: 05/23/2015   2000-2018 [employer] [address], [place] [address]. All rights reserved. This information is not intended as a substitute for professional medical care. Always follow your healthcare professional's instructions.

## 2017-05-27 NOTE — H&P (Signed)
The history and physical currently available in the chart has been reviewed and there are no significant interval changes since prior evaluation.  She has no complaints.  She was seen and examined by me prior to the procedure.  The risks, benefits and alternatives of the procedure have been discussed in detail and she has indicated that she understands the procedure, indications, and risks inherent to the procedure and is amenable to proceeding.  All questions were answered. Informed consent was signed and verified.      Kadee Philyaw, MD, FACC

## 2017-05-28 ENCOUNTER — Telehealth: Payer: Self-pay

## 2017-05-28 NOTE — Telephone Encounter (Signed)
Post procedure phone call made.    Spoke with patient.  No complaints.

## 2017-06-01 ENCOUNTER — Other Ambulatory Visit (INDEPENDENT_AMBULATORY_CARE_PROVIDER_SITE_OTHER): Payer: Self-pay

## 2017-06-01 ENCOUNTER — Ambulatory Visit (INDEPENDENT_AMBULATORY_CARE_PROVIDER_SITE_OTHER): Payer: Self-pay

## 2017-06-01 ENCOUNTER — Ambulatory Visit (INDEPENDENT_AMBULATORY_CARE_PROVIDER_SITE_OTHER): Payer: Self-pay | Admitting: Cardiovascular Disease

## 2017-06-07 ENCOUNTER — Ambulatory Visit (INDEPENDENT_AMBULATORY_CARE_PROVIDER_SITE_OTHER): Payer: Self-pay | Admitting: Cardiovascular Disease

## 2017-06-22 DIAGNOSIS — N390 Urinary tract infection, site not specified: Secondary | ICD-10-CM

## 2017-06-22 HISTORY — DX: Urinary tract infection, site not specified: N39.0

## 2017-06-23 ENCOUNTER — Ambulatory Visit: Payer: Medicare Other | Attending: Cardiovascular Disease

## 2017-06-23 ENCOUNTER — Telehealth: Payer: Medicare Other

## 2017-06-23 DIAGNOSIS — I4891 Unspecified atrial fibrillation: Secondary | ICD-10-CM | POA: Insufficient documentation

## 2017-06-23 LAB — CBC AND DIFFERENTIAL
Absolute NRBC: 0 10*3/uL
Basophils Absolute Automated: 0.02 10*3/uL (ref 0.00–0.20)
Basophils Automated: 0.6 %
Eosinophils Absolute Automated: 0.12 10*3/uL (ref 0.00–0.70)
Eosinophils Automated: 3.4 %
Hematocrit: 40.7 % (ref 37.0–47.0)
Hgb: 12.4 g/dL (ref 12.0–16.0)
Immature Granulocytes Absolute: 0.01 10*3/uL
Immature Granulocytes: 0.3 %
Lymphocytes Absolute Automated: 1.03 10*3/uL (ref 0.50–4.40)
Lymphocytes Automated: 29.2 %
MCH: 31.4 pg (ref 28.0–32.0)
MCHC: 30.5 g/dL — ABNORMAL LOW (ref 32.0–36.0)
MCV: 103 fL — ABNORMAL HIGH (ref 80.0–100.0)
MPV: 10.3 fL (ref 9.4–12.3)
Monocytes Absolute Automated: 0.36 10*3/uL (ref 0.00–1.20)
Monocytes: 10.2 %
Neutrophils Absolute: 1.99 10*3/uL (ref 1.80–8.10)
Neutrophils: 56.3 %
Nucleated RBC: 0 /100 WBC (ref 0.0–1.0)
Platelets: 141 10*3/uL (ref 140–400)
RBC: 3.95 10*6/uL — ABNORMAL LOW (ref 4.20–5.40)
RDW: 17 % — ABNORMAL HIGH (ref 12–15)
WBC: 3.53 10*3/uL (ref 3.50–10.80)

## 2017-06-23 LAB — BASIC METABOLIC PANEL
BUN: 27 mg/dL — ABNORMAL HIGH (ref 7.0–19.0)
CO2: 27 mEq/L (ref 21–29)
Calcium: 9.6 mg/dL (ref 7.9–10.2)
Chloride: 103 mEq/L (ref 100–111)
Creatinine: 1.2 mg/dL (ref 0.4–1.5)
Glucose: 132 mg/dL — ABNORMAL HIGH (ref 70–100)
Potassium: 5.3 mEq/L — ABNORMAL HIGH (ref 3.5–5.1)
Sodium: 140 mEq/L (ref 136–145)

## 2017-06-23 LAB — GFR: EGFR: 42.9

## 2017-06-23 LAB — HEMOLYSIS INDEX: Hemolysis Index: 7 (ref 0–18)

## 2017-06-23 NOTE — Pre-Procedure Instructions (Signed)
So here are the labs that were done today on [name].   Bi-V ICD's do get some contrast. Please  alert MD and I await your reply if anything needs to be done. Thanks, Aggie Cosier  We have messaged Dr. Amado Coe about the lab's results. He is off this week and we will let you know if he wants to do anything.

## 2017-06-23 NOTE — H&P (Addendum)
H&P UPDATE WITH ASA/MALLAMPATTI    Date Time: 06/28/17 7:54 AM    PROCEDURE:    Biv icd upgrade  H&P:    The history and physical including past medical, family, and social history were reviewed   and there are no significant interval changes from what is currently available in the chart  from prior evaluation. She has no complaints.  She was seen and examined by me prior   to the procedure.   ALLERGIES:    Torsemide; Erythromycin; Gluten meal; and Tikosyn [dofetilide]   LABS:      Lab Results   Component Value Date    WBC 3.53 06/23/2017    HGB 12.4 06/23/2017    HCT 40.7 06/23/2017    PLT 141 06/23/2017    NA 140 06/23/2017    K 5.3 (H) 06/23/2017    CL 103 06/23/2017    CO2 27 06/23/2017    MG 2.2 05/10/2017    BUN 27.0 (H) 06/23/2017    CREAT 1.2 06/23/2017    EGFR 42.9 06/23/2017    GLU 132 (H) 06/23/2017       CONCLUSION:    The risks, benefits and alternatives of the procedure have been discussed in detail and   she has indicated that she understands the procedure, indications, and risks inherent to the   procedure and is amenable to proceeding.  All questions were answered. Informed   consent was signed and verified.      Signed by: Thurnell Lose, MD

## 2017-06-23 NOTE — Pre-Procedure Instructions (Addendum)
   Procedure Verified ICD upgrade to BI V ICD with concurrent CVers 06/28/2017 @ 1100   Pt ID verified   NPO p MN reinforced , w read back   Reinforced medication instructions by MD, may take morning meds w sips of water   Arrival time at  0900 w read back, parking at [employer] [facility], check in at CATH/EP lab registration, IHVI grd flr with read back   Ride arranged with Jonny Ruiz Husband [phone]   Pt instructed to bring medication list; do not bring meds to hospital; may bring rescue inhaler DOS   CHG/ Hibiclens prep instructions given and reviewed; states " the doctor did not tell me about that"   Labs done 06/23/2017 in EPIC CBC BMP GFR ( K+ 5.3)   CKD: GFR 42.9; BUN 27   ICD: St Jude, to bring reg card DOS   Poss overnight stay   Uses cane prn   Xarelto ( per MD do not take PM before or am DOS)       TEE Questions:    Heartburn/Reflux - occassionally  Upper endoscopy -11/2016 states "normal"  Esophageal cancer -denies  Esophageal dilatation - denies  Difficulty swallowing -states "sometimes I have a hard time taking these pills"  Food stuck in throat -denies  Radiation to chest -denies

## 2017-06-25 NOTE — Pre-Procedure Instructions (Signed)
   email sent to surgeon's scheduler regarding abn labs

## 2017-06-28 ENCOUNTER — Ambulatory Visit: Payer: Medicare Other | Admitting: Anesthesiology

## 2017-06-28 ENCOUNTER — Ambulatory Visit
Admission: RE | Admit: 2017-06-28 | Discharge: 2017-06-29 | Disposition: A | Discharge status: Home / Self Care | Payer: Medicare Other | Source: Ambulatory Visit | Attending: Cardiovascular Disease | Admitting: Cardiovascular Disease

## 2017-06-28 ENCOUNTER — Ambulatory Visit: Payer: Medicare Other

## 2017-06-28 ENCOUNTER — Encounter
Admission: RE | Disposition: A | Discharge status: Home / Self Care | Payer: Self-pay | Source: Ambulatory Visit | Attending: Cardiovascular Disease

## 2017-06-28 ENCOUNTER — Ambulatory Visit (INDEPENDENT_AMBULATORY_CARE_PROVIDER_SITE_OTHER): Payer: Self-pay

## 2017-06-28 DIAGNOSIS — I447 Left bundle-branch block, unspecified: Secondary | ICD-10-CM | POA: Insufficient documentation

## 2017-06-28 DIAGNOSIS — I420 Dilated cardiomyopathy: Secondary | ICD-10-CM | POA: Insufficient documentation

## 2017-06-28 DIAGNOSIS — I481 Persistent atrial fibrillation: Secondary | ICD-10-CM | POA: Insufficient documentation

## 2017-06-28 DIAGNOSIS — Z4502 Encounter for adjustment and management of automatic implantable cardiac defibrillator: Secondary | ICD-10-CM | POA: Insufficient documentation

## 2017-06-28 DIAGNOSIS — Z7901 Long term (current) use of anticoagulants: Secondary | ICD-10-CM | POA: Insufficient documentation

## 2017-06-28 DIAGNOSIS — Z79899 Other long term (current) drug therapy: Secondary | ICD-10-CM | POA: Insufficient documentation

## 2017-06-28 DIAGNOSIS — I5022 Chronic systolic (congestive) heart failure: Secondary | ICD-10-CM | POA: Insufficient documentation

## 2017-06-28 HISTORY — DX: Gastric ulcer, unspecified as acute or chronic, without hemorrhage or perforation: K25.9

## 2017-06-28 SURGERY — ICD UGRADE TO ICD BIV
Anesthesia: Anesthesia General

## 2017-06-28 MED ORDER — PROPOFOL INFUSION 10 MG/ML
INTRAVENOUS | Status: DC | PRN
Start: 2017-06-28 — End: 2017-06-28
  Administered 2017-06-28: 20 mg via INTRAVENOUS
  Administered 2017-06-28: 50 mg via INTRAVENOUS
  Administered 2017-06-28: 20 mg via INTRAVENOUS

## 2017-06-28 MED ORDER — GLYCOPYRROLATE 0.2 MG/ML IJ SOLN
INTRAMUSCULAR | Status: AC
Start: 2017-06-28 — End: ?
  Filled 2017-06-28: qty 1

## 2017-06-28 MED ORDER — CEFUROXIME SODIUM 1.5 G IJ/IV SOLR (WRAP)
Status: DC | PRN
Start: 2017-06-28 — End: 2017-06-28
  Administered 2017-06-28: 08:00:00 1.5 g via INTRAVENOUS

## 2017-06-28 MED ORDER — FENTANYL CITRATE (PF) 50 MCG/ML IJ SOLN (WRAP)
25.0000 ug | INTRAMUSCULAR | Status: DC | PRN
Start: 2017-06-28 — End: 2017-06-29
  Administered 2017-06-28: 25 ug via INTRAVENOUS
  Filled 2017-06-28: qty 2

## 2017-06-28 MED ORDER — IODIXANOL 320 MG/ML IV SOLN
22.00 mL | Freq: Once | INTRAVENOUS | Status: AC
Start: 2017-06-28 — End: 2017-06-28
  Administered 2017-06-28: 10:00:00 22 mL via INTRAVENOUS

## 2017-06-28 MED ORDER — ONDANSETRON HCL 4 MG/2ML IJ SOLN
INTRAMUSCULAR | Status: DC | PRN
Start: 2017-06-28 — End: 2017-06-28
  Administered 2017-06-28: 4 mg via INTRAVENOUS

## 2017-06-28 MED ORDER — CEFUROXIME SODIUM 1.5 G IJ/IV SOLR (WRAP)
Status: AC
Start: 2017-06-28 — End: ?
  Filled 2017-06-28: qty 1500

## 2017-06-28 MED ORDER — LIDOCAINE HCL (PF) 2 % IJ SOLN
INTRAMUSCULAR | Status: AC
Start: 2017-06-28 — End: ?
  Filled 2017-06-28: qty 5

## 2017-06-28 MED ORDER — PROPOFOL 10 MG/ML IV EMUL (WRAP)
INTRAVENOUS | Status: AC
Start: 2017-06-28 — End: ?
  Filled 2017-06-28: qty 50

## 2017-06-28 MED ORDER — BACITRACIN 50000 UNITS IM SOLR
INTRAMUSCULAR | Status: AC
Start: 2017-06-28 — End: 2017-06-28
  Filled 2017-06-28: qty 50000

## 2017-06-28 MED ORDER — LACTATED RINGERS IV SOLN
INTRAVENOUS | Status: DC
Start: 2017-06-28 — End: 2017-06-29

## 2017-06-28 MED ORDER — SPIRONOLACTONE 25 MG PO TABS
12.50 mg | ORAL_TABLET | Freq: Every day | ORAL | Status: DC
Start: 2017-06-28 — End: 2017-06-28

## 2017-06-28 MED ORDER — PROPOFOL INFUSION 10 MG/ML
INTRAVENOUS | Status: DC | PRN
Start: 2017-06-28 — End: 2017-06-28
  Administered 2017-06-28: 200 ug/kg/min via INTRAVENOUS

## 2017-06-28 MED ORDER — SIMVASTATIN 10 MG PO TABS
10.00 mg | ORAL_TABLET | Freq: Every evening | ORAL | Status: DC
Start: 2017-06-28 — End: 2017-06-29
  Administered 2017-06-28: 21:00:00 10 mg via ORAL
  Filled 2017-06-28 (×2): qty 1

## 2017-06-28 MED ORDER — GLYCOPYRROLATE 0.2 MG/ML IJ SOLN
INTRAMUSCULAR | Status: DC | PRN
Start: 2017-06-28 — End: 2017-06-28
  Administered 2017-06-28 (×2): 0.1 mg via INTRAVENOUS

## 2017-06-28 MED ORDER — CARVEDILOL 6.25 MG PO TABS
12.50 mg | ORAL_TABLET | Freq: Two times a day (BID) | ORAL | Status: DC
Start: 2017-06-28 — End: 2017-06-29
  Administered 2017-06-28 – 2017-06-29 (×3): 12.5 mg via ORAL
  Filled 2017-06-28 (×4): qty 2

## 2017-06-28 MED ORDER — ONDANSETRON HCL 4 MG/2ML IJ SOLN
INTRAMUSCULAR | Status: AC
Start: 2017-06-28 — End: ?
  Filled 2017-06-28: qty 2

## 2017-06-28 MED ORDER — LOSARTAN POTASSIUM 25 MG PO TABS
12.50 mg | ORAL_TABLET | Freq: Every day | ORAL | Status: DC
Start: 2017-06-29 — End: 2017-06-29
  Administered 2017-06-29: 09:00:00 12.5 mg via ORAL
  Filled 2017-06-28: qty 1

## 2017-06-28 MED ORDER — HEPARIN SODIUM (PORCINE) 1000 UNIT/ML IJ SOLN
INTRAMUSCULAR | Status: AC
Start: 2017-06-28 — End: 2017-06-28
  Filled 2017-06-28: qty 10

## 2017-06-28 MED ORDER — SODIUM CHLORIDE 0.9 % IV SOLN
INTRAVENOUS | Status: DC | PRN
Start: 2017-06-28 — End: 2017-06-28
  Administered 2017-06-28: 08:00:00 10 ug/min via INTRAVENOUS

## 2017-06-28 MED ORDER — FAMOTIDINE 20 MG/2ML IV SOLN
INTRAVENOUS | Status: AC
Start: 2017-06-28 — End: ?
  Filled 2017-06-28: qty 2

## 2017-06-28 MED ORDER — AMIODARONE HCL 200 MG PO TABS
200.00 mg | ORAL_TABLET | Freq: Every day | ORAL | Status: DC
Start: 2017-06-28 — End: 2017-06-29
  Administered 2017-06-28 – 2017-06-29 (×2): 200 mg via ORAL
  Filled 2017-06-28 (×2): qty 1

## 2017-06-28 MED ORDER — ACETAMINOPHEN 500 MG PO TABS
500.00 mg | ORAL_TABLET | Freq: Three times a day (TID) | ORAL | Status: DC
Start: 2017-06-28 — End: 2017-06-29
  Administered 2017-06-28 – 2017-06-29 (×4): 500 mg via ORAL
  Filled 2017-06-28 (×4): qty 1

## 2017-06-28 MED ORDER — SODIUM CHLORIDE 0.9 % IV MBP
1.5000 g | Freq: Three times a day (TID) | INTRAVENOUS | Status: AC
Start: 2017-06-28 — End: 2017-06-29
  Administered 2017-06-28 – 2017-06-29 (×2): 1.5 g via INTRAVENOUS
  Filled 2017-06-28: qty 1500
  Filled 2017-06-28: qty 100
  Filled 2017-06-28: qty 1500
  Filled 2017-06-28: qty 100

## 2017-06-28 MED ORDER — LIDOCAINE HCL (PF) 2 % IJ SOLN
INTRAMUSCULAR | Status: DC | PRN
Start: 2017-06-28 — End: 2017-06-28
  Administered 2017-06-28: 20 mg via INTRAVENOUS

## 2017-06-28 MED ORDER — ONDANSETRON HCL 4 MG/2ML IJ SOLN
4.0000 mg | Freq: Once | INTRAMUSCULAR | Status: DC | PRN
Start: 2017-06-28 — End: 2017-06-29

## 2017-06-28 MED ORDER — PROPOFOL 10 MG/ML IV EMUL (WRAP)
INTRAVENOUS | Status: AC
Start: 2017-06-28 — End: ?
  Filled 2017-06-28: qty 20

## 2017-06-28 MED ORDER — PHENYLEPHRINE 100 MCG/ML IN NACL 0.9% IV SOSY
PREFILLED_SYRINGE | INTRAVENOUS | Status: AC
Start: 2017-06-28 — End: ?
  Filled 2017-06-28: qty 5

## 2017-06-28 MED ORDER — FUROSEMIDE 40 MG PO TABS
40.00 mg | ORAL_TABLET | Freq: Every morning | ORAL | Status: DC
Start: 2017-06-28 — End: 2017-06-29
  Administered 2017-06-28 – 2017-06-29 (×2): 40 mg via ORAL
  Filled 2017-06-28 (×2): qty 1

## 2017-06-28 MED ORDER — FENTANYL CITRATE (PF) 50 MCG/ML IJ SOLN (WRAP)
INTRAMUSCULAR | Status: AC
Start: 2017-06-28 — End: ?
  Filled 2017-06-28: qty 2

## 2017-06-28 MED ORDER — LIDOCAINE HCL (PF) 1 % IJ SOLN
INTRAMUSCULAR | Status: AC
Start: 2017-06-28 — End: 2017-06-28
  Administered 2017-06-28: 09:00:00 10 mL
  Filled 2017-06-28: qty 30

## 2017-06-28 MED ORDER — GABAPENTIN 100 MG PO CAPS
100.00 mg | ORAL_CAPSULE | Freq: Three times a day (TID) | ORAL | Status: DC
Start: 2017-06-28 — End: 2017-06-29
  Administered 2017-06-28 – 2017-06-29 (×4): 100 mg via ORAL
  Filled 2017-06-28 (×6): qty 1

## 2017-06-28 MED ORDER — FAMOTIDINE 10 MG/ML IV SOLN (WRAP)
INTRAVENOUS | Status: DC | PRN
Start: 2017-06-28 — End: 2017-06-28
  Administered 2017-06-28: 20 mg via INTRAVENOUS

## 2017-06-28 MED ORDER — SPIRONOLACTONE 25 MG PO TABS
12.50 mg | ORAL_TABLET | Freq: Every day | ORAL | Status: DC
Start: 2017-06-29 — End: 2017-06-29
  Administered 2017-06-29: 09:00:00 12.5 mg via ORAL
  Filled 2017-06-28: qty 1

## 2017-06-28 MED ORDER — RIVAROXABAN 15 MG PO TABS
15.00 mg | ORAL_TABLET | Freq: Every day | ORAL | Status: DC
Start: 2017-06-28 — End: 2017-06-29
  Administered 2017-06-28: 17:00:00 15 mg via ORAL
  Filled 2017-06-28: qty 1

## 2017-06-28 MED ORDER — PHENYLEPHRINE HCL 10 MG/ML IV SOLN (WRAP)
Status: DC | PRN
Start: 2017-06-28 — End: 2017-06-28
  Administered 2017-06-28: 100 ug via INTRAVENOUS

## 2017-06-28 MED ORDER — LOSARTAN POTASSIUM 25 MG PO TABS
12.50 mg | ORAL_TABLET | Freq: Every day | ORAL | Status: DC
Start: 2017-06-28 — End: 2017-06-28

## 2017-06-28 MED ORDER — ACETAMINOPHEN 325 MG PO TABS
650.0000 mg | ORAL_TABLET | Freq: Once | ORAL | Status: AC | PRN
Start: 2017-06-28 — End: 2017-06-28
  Administered 2017-06-28: 650 mg via ORAL
  Filled 2017-06-28: qty 2

## 2017-06-28 NOTE — Transfer of Care (Signed)
Anesthesia Transfer of Care Note    Patient: [name]    Procedures performed: Procedure(s) with comments:  ICD Ugrade to ICD BiV / CONCURRENT C- VERS - st jude    Anesthesia type: General LMA    Patient location:ICAR    Last vitals:   Vitals:    06/28/17 1059   BP: 112/58   Pulse: 60   Resp: 16   Temp:    SpO2: 97%       Post pain: Patient not complaining of pain, continue current therapy      Mental Status:awake    Respiratory Function: tolerating face mask    Cardiovascular: stable    Nausea/Vomiting: patient not complaining of nausea or vomiting    Hydration Status: adequate    Post assessment: no apparent anesthetic complications, no reportable events and no evidence of recall    Signed by: Lovett Sox Wilena Tyndall  06/28/17 11:03 AM

## 2017-06-28 NOTE — Progress Notes (Signed)
Patient admitted [facility]  room 11  for upgrade to Biv ICs , pre-procedure check completed, consent and updated H & P to be done call bell in reach husband at the bedside .

## 2017-06-28 NOTE — UM Notes (Signed)
Admit to inpatient: 06/28/17 0620        ICD Ugrade to ICD BiV / CONCURRENT C- VERS - st jude    Kendrick Ranch  RN/CM  [facility]  Utilization Review Nurse  Cell # [phone]  Office # [phone]

## 2017-06-28 NOTE — Addendum Note (Signed)
Addendum  created 06/28/17 1424 by Bunnie Domino, CRNA    Anesthesia Intra Flowsheets edited

## 2017-06-28 NOTE — Discharge Instructions (Signed)
Interventional Cardiovascular Admission and Recovery  EP Discharge Instructions  Pacemaker and/or ICD Device Implant    ACTIVITY:  1. Do not drive until instructed by your doctor.  Discuss at your follow-up appointment.  2. Rest today and tomorrow, gradually increasing to your usual activities.  3. Limited activities for 4 weeks.   a. Do not raise your arm on the incision side above shoulder level. This gives the device lead wires time to attach securely inside your body.  b. No lifting anything greater than 10 pounds on the incision side.  4. Ask your doctor when you can return to work.      WOUND/INCISION CARE:  1. If the bandage was not removed in the hospital, REMOVE it within 24 hours of the procedure.  Do not remove the steri-strips. They will be removed at your follow-up visit.  2. You may shower daily keeping the incision area dry.  After 5 - 7 days you may gently wash the incision with soap and water and pat dry.  Do not submerge the incision site in water (bath tub, pool, etc) until it is completely healed.    3. If Dermabond was used to close the incision, you may shower daily.  See Dermabond instructions.   4. Do not apply any creams, powders, lotions or ointments to the site/s.  5. Do not rub, scrub, pick, scratch or even use a wash cloth over the site/s.  6. Observe for signs of infection:  redness, warmth, swelling, drainage, or temperature greater than 100 degrees F.  If you suspect infection call the doctor who performed the procedure.  7. You may have some tenderness.  May take ACETAMINOPHEN (Tylenol) if needed.  8. You may experience some mild bruising.  9. If you notice bleeding either at the incision site or underneath the skin, you should lay down flat and hold pressure on the area for 10 minutes and call your doctor.  If the bleeding does not stop, you should continue to hold pressure and call 911.  10. If your arm becomes cold, numb, painful, grayish in color, or change from usual  color/sensation occurs, you should call 911.    WHEN TO CALL THE DOCTOR:  (Call the doctor who placed your device)       1.  If you have signs of infection       2.  Dizziness, fainting       3. Chest pain, rapid pulse or shortness of breath       4. Chest muscle twitching or hiccups that won't stop       5. Unrelieved pain    ADDITIONAL INFORMATION:       1.  You will be given an ID card that contains information about your pacemaker.  Initially it will be a temporary card. A permanent card will be mailed to you within a few weeks.  Always carry this card with you.       2.  Antibiotics - May need pre-treatment for prevention of infection prior to any elective surgical or dental procedure.  Discuss with your doctor.       3. Keep your cell phone away from your device.  Don't carry the phone in your shirt pocket, even when it's turned off.       4. Avoid strong magnets.  Examples are those used in MRIs or in hand-held security wands.      5.  Refer to the device instruction manual for further instruction.

## 2017-06-28 NOTE — Brief Op Note (Signed)
Bena Kobel  MRN: [id number]  06/28/2017 10:51 AM        Brief Op Note    Pre-Op Diagnosis Codes:     * Congestive cardiomyopathy [I42.0], persistent Atrial fibrillation  Post-Op Dx: same  Procedure(s):  ICD Ugrade to ICD BiV / CONCURRENT C- VERS (SJM via left subclavian vein)  Performing Physician: Thurnell Lose, MD    Plan:   1. Bed rest x 4 hours  2. IV antibiotics x 24 hours  3. CXR to document lead position/ rule out PTX  4. Device interrogation in morning  5.  Resume Xarelto this PM

## 2017-06-28 NOTE — Anesthesia Preprocedure Evaluation (Addendum)
Anesthesia Evaluation    AIRWAY    Mallampati: III    TM distance: >3 FB  Neck ROM: limited  Mouth Opening:full   CARDIOVASCULAR    irregular and normal       DENTAL    no notable dental hx       PULMONARY    pulmonary exam normal     OTHER FINDINGS    ===============================================================  Inpatient Anesthesia Evaluation    Patient Name: [name],[name]  Surgeon: Thurnell Lose, MD  Patient Age / Sex: 84 y.o. / female    Medical History:  Past Medical History:  No date: Abnormal vision      Comment:  Dry eyes; marginal vision L eye  No date: Abnormal vision      Comment:  Wears glasses  No date: Anemia      Comment:  controlled on meds  No date: Aortic valve insufficiency  No date: Arrhythmia      Comment:  Atrial fibrillation, Pvc's  No date: Arthritis      Comment:  osteoarthritis, back and pelvis  No date: AVD (aortic valve disease)  No date: Benign heart murmur  No date: Bilateral cataracts      Comment:  surgery, right eye  No date: Cardiac arrest      Comment:  while on  Tikosyn  No date: Celiac disease  No date: Celiac disease  ~2008: Chronic obstructive pulmonary disease      Comment:  mild-mod  No date: Congestive heart failure      Comment:  chronic systolic  No date: Difficulty walking      Comment:  Back pain; Uses cane  No date: Disease of lung  ?: Disorder of musculoskeletal system      Comment:  ?  No date: Dyspnea on exertion  No date: Ear, nose and throat disorder      Comment:  Hoarseness  No date: Encounter for blood transfusion      Comment:  last june 2018  10/31/2013: Encounter for cardioversion procedure  No date: Gastric ulceration      Comment:  GIBleed with transfusion  No date: GERD (gastroesophageal reflux disease)      Comment:  occasional , asymptomatic  No date: Headache      Comment:  infrequently  No date: Heart murmur  No date: Hyperlipidemia      Comment:  controlled with meds  06/2017: Hypertension      Comment:  controlled w/meds: 118/70  No date:  Hypothyroidism      Comment:  controlled with meds  No date: ICD (implantable cardioverter-defibrillator) in place      Comment:  St Jude 06/14/2016; VF arrest  No date: Low back pain      Comment:  chronic  No date: Mitral valve insufficiency  No date: Mitral valve prolapse  No date: Orthopedic aftercare      Comment:  scoliosis, stenosis, athritis  No date: Osteoporosis  No date: Osteoporosis  No date: Pneumonia      Comment:  ~20 years ago; has had pneumonia shot 2010 and 2015  No date: Primary cardiomyopathy      Comment:  DCM EF 22% per Cath10/2018  No date: Scoliosis      Comment:  Severe  No date: Shortness of breath  No date: Thyroid disease  06/2017: Urinary tract infection      Comment:  Years ago  No date: Vein disorder      Comment:  bilateral legs  Past Surgical History:  as a child: ADENOIDECTOMY  AS A CHILD: APPENDECTOMY      Comment:  as a child  10/2016: ASD REPAIR  2007: CARDIAC CATHETERIZATION  2014: CARDIAC CATHETERIZATION  03/2016/10/2016; : CARDIAC CATHETERIZATION  10/2016: CARDIAC CATHETERIZATION      Comment:  [facility]  06/14/2016: CARDIAC DEFIBRILLATOR PLACEMENT; Left      Comment:  St Jude  10/2013; 11/2013; 01/2015: CARDIOVERSION      Comment:  x3 for A Fib , w/TEE  05/10/2017: CARDIOVERSION INTERNAL      Comment:  several 2015, 2016, 2017  2001: CATARACT EXTRACTION W/ INTRAOCULAR LENS IMPLANT; Right  05/20/2016: CLOSED REDUCTION, INSERTION, HIP IM NAIL (GAMMA); Left      Comment:  Procedure: CLOSED REDUCTION, INSERTION, HIP IM NAIL                (GAMMA);  Surgeon: Timoteo Ace, MD;  Location:                [facility];  Service: Orthopedics;  Laterality:                Left;  2001: COLONOSCOPY  12/09/2016: EGD, COLONOSCOPY; N/A      Comment:  Procedure: EGD, COLONOSCOPY;  Surgeon: Annalee Genta, MD;  Location: [facility];  Service:                Gastroenterology;  Laterality: N/A;  05/25/2016: INSERT / REPLACE / REMOVE PACEMAKER      Comment:  ICD card dos   10-15-15: JOINT REPLACEMENT; Right      Comment:   KNEE No Complications  2002: KNEE SURGERY; Left  05/2017: TEE  as a child: TONSILLECTOMY AND ADENOIDECTOMY      Allergies:   -- Torsemide -- Rash   -- Erythromycin -- Hives   -- Gluten Meal -- Other (See Comments)    --  Has Celiac disease   -- Tikosyn (Dofetilide)     --  VF arrest      Medications:  Current Facility-Administered Medications:  lactated ringers infusion, , Intravenous, Continuous              Prior to Admission medications :  Medication acetaminophen (TYLENOL) 500 MG tablet, Sig Take 1 tablet (500 mg total) by mouth every 6 (six) hours as needed for Pain.Patient taking differently: Take 500 mg by mouth 3 (three) times daily., Start Date 05/27/16, End Date , Taking? Yes, Authorizing Provider Baker Pierini, MD    Medication alendronate (FOSAMAX) 70 MG tablet, Sig Take 70 mg by mouth Once each week on Monday.Take 1 tab by mouth every 7 days with a full glass of water on an empty stomach. Do not take anything else by mouth or lie down for 30 mins. , Start Date , End Date , Taking? Yes, Authorizing Provider [provider]    Medication amiodarone (PACERONE) 200 MG tablet, Sig Take 200 mg by mouth daily., Start Date , End Date , Taking? Yes, Authorizing Provider [provider]    Medication Calcium Citrate-Vitamin D (CITRACAL + D PO), Sig Take 2 tablets by mouth Daily after lunch., Start Date , End Date , Taking? Yes, Authorizing Provider [provider]    Medication carvedilol (COREG) 12.5 MG tablet, Sig Take 12.5 mg by mouth 2 (two) times daily with meals., Start Date , End Date , Taking? Yes, Authorizing Provider [provider]    Medication CO-ENZYME Q-10 PO, Sig Take 100 mg by mouth daily., Start Date , End Date , Taking? Yes, Authorizing Provider [provider]    Medication fluticasone furoate (ARNUITY ELLIPTA) 100 MCG/ACT Aerosol Powder, Breath Activtivatede, Sig Inhale 1 puff into the lungs as  needed., Start Date , End Date , Taking? Yes, Authorizing Provider [provider]    Medication furosemide (LASIX) 40 MG tablet, Sig Take 40 mg by mouth every morning., Start Date , End Date , Taking? Yes, Authorizing Provider [provider]    Medication gabapentin (NEURONTIN) 100 MG capsule, Sig Take 100 mg by mouth 3 (three) times daily., Start Date , End Date , Taking? Yes, Authorizing Provider [provider]    Medication iron-vitamin C (VITRON-C) 65-125 MG Tab, Sig Take 1 tablet by mouth every tues and thurs., Start Date , End Date , Taking? Yes, Authorizing Provider [provider]    Medication levothyroxine (SYNTHROID, LEVOTHROID) 112 MCG tablet, Sig Take 112 mcg by mouth Once a day at 6:00am., Start Date , End Date , Taking? Yes, Authorizing Provider [provider]    Medication losartan (COZAAR) 25 MG tablet, Sig Take 12.5 mg by mouth daily., Start Date , End Date , Taking? Yes, Authorizing Provider [provider]    Medication magnesium oxide (MAG-OX) 400 MG tablet, Sig Take 0.5 tablets (200 mg total) by mouth daily.Patient taking differently: Take 200 mg by mouth every evening., Start Date 04/10/17, End Date , Taking? Yes, Authorizing Provider Zik, [place] J, MD    Medication Multiple Vitamin (MULTIVITAMIN) tablet, Sig Take 1 tablet by mouth Daily after lunch., Start Date , End Date , Taking? Yes, Authorizing Provider [provider]    Medication Probiotic Product (ALIGN) 4 MG Cap, Sig Take by mouth every morning.Take as directed, Start Date , End Date , Taking? Yes, Authorizing Provider [provider]    Medication rivaroxaban (XARELTO) 15 MG Tab, Sig Take 1 tablet (15 mg total) by mouth daily with dinner., Start Date 04/09/17, End Date , Taking? Yes, Authorizing Provider Zik, [place] J, MD    Medication simvastatin (ZOCOR) 10 MG tablet, Sig Take 10 mg by mouth nightly., Start Date , End Date , Taking? Yes, Authorizing Provider  [provider]    Medication spironolactone (ALDACTONE) 25 MG tablet, Sig Take 12.5 mg by mouth daily., Start Date , End Date , Taking? Yes, Authorizing Provider [provider]      Vitals  Temp:  (36.5 C (97.7 F)) 36.5 C (97.7 F)  Heart Rate:  (81) 81  Resp Rate:  (20) 20  BP: (121)/(74) 121/74    Wt Readings from Last 3 Encounters:  06/28/17 : 59 kg (130 lb)  05/10/17 : 61.7 kg (136 lb)  04/08/17 : 58.8 kg (129 lb 11.2 oz)    BMI (Estimated body mass index is 23.03 kg/m as calculated from the following:    Height as of this encounter: 1.6 m (5\' 3" ).    Weight as of this encounter: 59 kg (130 lb).)  Temp Readings from Last 3 Encounters:  06/28/17 : 36.5 C (97.7 F) (Axillary)  05/27/17 : 36.6 C (97.9 F) (Oral)  05/10/17 : 36.3 C (97.4 F) (Axillary)    BP Readings from Last 3 Encounters:  06/28/17 : 121/74  05/27/17 : 97/71  05/10/17 : 135/57    Pulse Readings from Last 3 Encounters:  06/28/17 : 81  05/27/17 : 75  05/10/17 : 81  Labs:  CBC:  Lab Results       Component                Value               Date                       WBC                      3.53                06/23/2017                 HGB                      12.4                06/23/2017                 HCT                      40.7                06/23/2017                 PLT                      141                 06/23/2017              Chemistries:  Lab Results       Component                Value               Date                       NA                       140                 06/23/2017                 K                        5.3 (H)             06/23/2017                 CL                       103                 06/23/2017                 CO2                      27                  06/23/2017                 BUN                      27.0 (H)  06/23/2017                 CREAT                    1.2                 06/23/2017                 GLU                      132 (H)              06/23/2017                 CA                       9.6                 06/23/2017                 AST                      34                  04/05/2017              Coags:  Lab Results       Component                Value               Date                       PT                       18.3 (H)            04/05/2017                 PTT                      35                  04/05/2017                 INR                      1.5 (H)             04/05/2017            _____________________      Signed by: Lisbeth Ply Josslyn Ciolek  06/28/17   7:01 AM    =============================================================  Left Ventricle    The LV function is severely reduced with EF of 25% with severe global wall  hypokinesis.  Right Ventricle    The RV appears to be dilated and the RV function is reduced.      Atrial Septum Findings    Patient is S/P left atrial closure device which is well seated.    There is no evidence of shunt across the atrial septum.    Atrial Appendage Findings    There is no evidence of LAA thrombus.      Aortic Valve    The AV is mildly thickened with mild degree of AI.    Mitral Valve    There appears  to be moderate degree of MR.    The distance between MV leaflets is end diastole is 4 mm int he three  chamber view ans 1 cm in the 4 chamber view.    The MV annulus is 4.3 cm.    The MV coaptation distance measures 5 mm.    The jet appears to be central.    Vena contracta is 5.5 mm. ERO is 0.18 cm2.    Tricuspid Valve    There is moderate degree of TR.      Pericardium / Pleural Effusion    Thereis no pericardial effusion.      Mitral Valve Measurements  ----------------------------------------------------------------------  Name                                 Value        Normal  ----------------------------------------------------------------------    MV Doppler  ----------------------------------------------------------------------              Relevant Problems   No relevant active  problems               Anesthesia Plan    ASA 4     general                     intravenous induction   Detailed anesthesia plan: general LMA and general IV  Monitors/Adjuncts: BIS    Post Op: telemetry  Post op pain management: per surgeon    informed consent obtained    Plan discussed with CRNA.      pertinent labs reviewed             Signed by: Audelia Acton 06/28/17 7:00 AM

## 2017-06-28 NOTE — Plan of Care (Signed)
Problem: Moderate/High Fall Risk Score >5  Goal: Patient will remain free of falls  Outcome: Progressing  Patient sitting up in bed, post PM upgrade. VSS. A&Ox4. Tele showing v-paced with underlying a-fib. Denies chest pain/pressure/sob/nausea. Left upper chest with OTA incision with dermabond, no edema/drainage present. Mild redness over PM site. No heat associated with that redness. Temp WNL at 98.67F orally. Denies numbness/tingling in left arm. Left radial pulse strong and equal to right radial pulse. Pain 4 out of 10 resting, when moving pain increases to 6 out of 10 incisional pain/tightness near site. Declines pain medication at this time. Changed size of sling to be effective for patient. Instructed to call with any needs and especially to get up to go to the bathroom. Denies any needs at this time. Call light in reach.

## 2017-06-28 NOTE — Anesthesia Postprocedure Evaluation (Signed)
Anesthesia Post Evaluation    Patient: [name]    Procedure(s) with comments:  ICD Ugrade to ICD BiV / CONCURRENT C- VERS - st jude    Anesthesia type: general    Last Vitals:   Vitals:    06/28/17 1226   BP: 122/70   Pulse: 60   Resp:    Temp:    SpO2:        Anesthesia Post Evaluation    Patient location during evaluation: PACU  Patient participation: complete - patient participated  Level of consciousness: awake and alert  Pain score: 2  Pain management: adequate  Airway patency: patent  Anesthetic complications: no  Cardiovascular status: acceptable  Respiratory status: acceptable  Hydration status: acceptable        Not Complete    Signed by: Audelia Acton, 06/28/2017 1:08 PM

## 2017-06-28 NOTE — Progress Notes (Signed)
Received report and patient into [facility] Room 37 s/p B via left chest. Assumed patient's care. Bedside handoff completed with RCIS, ID band verified, alert and oriented x 3, VSS, aldrete 9, pain 0 /10, paced. Left chest site is CDI. No hematoma, no bleeding, soft and non-tender. Palpable radial and ulnar pulses. Pt on 2L O2. Call bell within reach. Oriented pt to to room. 15 minute rounding implemented. Husband called to bedside. Site Checks/Vitals/Pain assessment/Pulses checked per protocol. Educated patient on site precautions including but not limited to: bed rest, left arm precautions post procedure, head position, etc.   Patient verbalized understanding of all education.

## 2017-06-29 ENCOUNTER — Encounter: Payer: Self-pay | Admitting: Cardiovascular Disease

## 2017-06-29 DIAGNOSIS — I420 Dilated cardiomyopathy: Secondary | ICD-10-CM

## 2017-06-29 LAB — BASIC METABOLIC PANEL
BUN: 23 mg/dL — ABNORMAL HIGH (ref 7.0–19.0)
CO2: 25 mEq/L (ref 22–29)
Calcium: 8.5 mg/dL (ref 7.9–10.2)
Chloride: 104 mEq/L (ref 100–111)
Creatinine: 1.1 mg/dL — ABNORMAL HIGH (ref 0.6–1.0)
Glucose: 89 mg/dL (ref 70–100)
Potassium: 4.2 mEq/L (ref 3.5–5.1)
Sodium: 139 mEq/L (ref 136–145)

## 2017-06-29 LAB — GFR: EGFR: 47.4

## 2017-06-29 NOTE — UM Notes (Signed)
1/8 amb csr    ICD upgrade to ICD BIV    Cordelia Pen MSN, [employer], [place]  Utilization Review Case Manager  [employer]  [phone]

## 2017-06-29 NOTE — Discharge Summary (Signed)
[facility]   PROGRESS NOTE     [facility]       Date Time: 06/29/17 9:01 AM Patient Name: [name], [name]  Medical Record #:  [id number] Account#:  [id number] Admission Date:  06/28/2017      EP Attending Impressions:    My a/p as below. Home today after bivicd upgrade. Normal interrogation as below.  Assessment:    CHF/LBBB/NICM s/p upgrade to BiV ICD 06/28/17   Cardiac arrest s/p ICD implant 12/17   Chronic AF with extensive biatrial ablation with eventual return to AF now on amio   tikosyn d/c'd in the setting of cardiac arrest with new CM EF 25%   NICM EF 25% mod MR   GI bleeding previously, no source identified, tolerating xarelto    Recommendations:    Upgrade to BIV ICD with stable sensing, impedance and threshold values on all 3 leads   CXR and site stable, L arm and water instructions explained   D/c home continue xarelto, amio, coreg, losartan and statin will set f/u in our office in 1 week site check, 4-6 weeks device check.    Medications:      Scheduled Meds:      acetaminophen 500 mg Oral TID   amiodarone 200 mg Oral Daily   carvedilol 12.5 mg Oral BID Meals   furosemide 40 mg Oral QAM   gabapentin 100 mg Oral TID   losartan 12.5 mg Oral Daily   rivaroxaban 15 mg Oral Daily with dinner   simvastatin 10 mg Oral QHS   spironolactone 12.5 mg Oral Daily       Continuous Infusions:  . lactated ringers 100 mL/hr at 06/28/17 0823        Subjective:   Denies CP, SOB or palpitations  Physical Exam:     VITAL SIGNS PHYSICAL EXAM   Temp:  [97.4 F (36.3 C)-98.9 F (37.2 C)] 97.5 F (36.4 C)  Heart Rate:  [60-88] 69  Resp Rate:  [16-18] 18  BP: (97-123)/(55-70) 104/60  Temp (24hrs), Avg:97.8 F (36.6 C), Min:97.4 F (36.3 C), Max:98.9 F (37.2 C)          Intake/Output Summary (Last 24 hours) at 06/29/17 0901  Last data filed at 06/28/17 1052   Gross per 24 hour   Intake              350 ml   Output               20 ml   Net              330 ml    Physical Exam   General: awake, alert, breathing comfortable, no acute distress  Head: normocephalic  Eyes: EOM's intact  Cardiovascular: RRR no m/r/g  Neck: no carotid bruits or JVD  Lungs: clear to auscultation bilaterally, without wheezing, rhonchi, or rales  Abdomen: soft, non-tender, non-distended; normoactive bowel sounds  Extremities: no edema  Neurological: Alert and oriented X3, mood and affect normal  Musculoskeletal:  Good strength and tone         Labs:                     Recent Labs  Lab 06/23/17  0800   WBC 3.53   Hgb 12.4   Hematocrit 40.7   Platelets 141       Recent Labs  Lab 06/29/17  0340 06/23/17  0800   Sodium 139 140   Potassium 4.2 5.3*  Chloride 104 103   CO2 25 27   BUN 23.0* 27.0*   Creatinine 1.1* 1.2   EGFR 47.4 42.9   Glucose 89 132*   Calcium 8.5 9.6        Estimated Creatinine Clearance: 32.1 mL/min (A) (based on SCr of 1.1 mg/dL (H)).  Diagnostics:   Telemetry: BiVP, occ PVC's    Device: Surveyor, mining [employer]  Set DDD 70-120bpm, battery 7 years charge time of 8.1 sec, monitor zone 171bpm, therapy at 200bpm, sensing 1.7/10.59mV, impedances 410/380/640/48 ohms, thresholds 1.5V@ 0.65ms, RV and LV <1V@ 0.49ms, no VT/VF or shocks, AP 35%, BiVP 85%, no AF.     Incision: c/d/i     CXR:  Cardiac device present with no definite pneumothorax.  Signed by     Edythe Lynn, PA-C    [facility]  [facility]    Arrhythmia Spectralink [phone] (8am-4:30pm)  After hours, non urgent consult line [phone]  After Hours, urgent consults [phone]    Thanks, Particia Lather, MD  [facility]  [phone]; [phone] FFX Spectralink.

## 2017-06-29 NOTE — Progress Notes (Signed)
Patient discharge order noted, discharge instructions reviewed and patient demonstrated understanding, telemetry and saline IV discontinue and no issues or complaints noted, vital signs stable, husband arrived for pick up and nurse care partner assisted patient in wheelchair to car, safety ensured.

## 2017-06-29 NOTE — Discharge Instr - AVS First Page (Signed)
Reason for your Hospital Admission:  ICD upgrade: BIV      Instructions for after your discharge:  Interventional Cardiovascular Admission and Recovery  EP Discharge Instructions  Pacemaker and/or ICD Device Implant    ACTIVITY:  1. Do not drive until instructed by your doctor.  Discuss at your follow-up appointment.  2. Rest today and tomorrow, gradually increasing to your usual activities.  3. Limited activities for 4 weeks.   a. Do not raise your arm on the incision side above shoulder level. This gives the device lead wires time to attach securely inside your body.  b. No lifting anything greater than 10 pounds on the incision side.  4. Ask your doctor when you can return to work.      WOUND/INCISION CARE:  1. If the bandage was not removed in the hospital, REMOVE it within 24 hours of the procedure.  Do not remove the steri-strips. They will be removed at your follow-up visit.  2. You may shower daily keeping the incision area dry.  After 5 - 7 days you may gently wash the incision with soap and water and pat dry.  Do not submerge the incision site in water (bath tub, pool, etc) until it is completely healed.    3. If Dermabond was used to close the incision, you may shower daily.  See Dermabond instructions.   4. Do not apply any creams, powders, lotions or ointments to the site/s.  5. Do not rub, scrub, pick, scratch or even use a wash cloth over the site/s.  6. Observe for signs of infection:  redness, warmth, swelling, drainage, or temperature greater than 100 degrees F.  If you suspect infection call the doctor who performed the procedure.  7. You may have some tenderness.  May take ACETAMINOPHEN (Tylenol) if needed.  8. You may experience some mild bruising.  9. If you notice bleeding either at the incision site or underneath the skin, you should lay down flat and hold pressure on the area for 10 minutes and call your doctor.  If the bleeding does not stop, you should continue to hold pressure and call  911.  10. If your arm becomes cold, numb, painful, grayish in color, or change from usual color/sensation occurs, you should call 911.    WHEN TO CALL THE DOCTOR:  (Call the doctor who placed your device)       1.  If you have signs of infection       2.  Dizziness, fainting       3. Chest pain, rapid pulse or shortness of breath       4. Chest muscle twitching or hiccups that won't stop       5. Unrelieved pain    ADDITIONAL INFORMATION:       1.  You will be given an ID card that contains information about your pacemaker.  Initially it will be a temporary card. A permanent card will be mailed to you within a few weeks.  Always carry this card with you.       2.  Antibiotics - May need pre-treatment for prevention of infection prior to any elective surgical or dental procedure.  Discuss with your doctor.       3. Keep your cell phone away from your device.  Don't carry the phone in your shirt pocket, even when it's turned off.       4. Avoid strong magnets.  Examples are those used in MRIs or  in hand-held security wands.      5.  Refer to the device instruction manual for further instruction.

## 2017-06-30 LAB — ECG 12-LEAD
Atrial Rate: 76 {beats}/min
P Axis: -51 degrees
Q-T Interval: 464 ms
QRS Duration: 118 ms
QTC Calculation (Bezet): 522 ms
R Axis: 185 degrees
T Axis: 59 degrees
Ventricular Rate: 76 {beats}/min

## 2017-07-07 ENCOUNTER — Ambulatory Visit (INDEPENDENT_AMBULATORY_CARE_PROVIDER_SITE_OTHER): Payer: Self-pay

## 2017-07-20 ENCOUNTER — Ambulatory Visit (INDEPENDENT_AMBULATORY_CARE_PROVIDER_SITE_OTHER): Payer: Self-pay | Admitting: Cardiology

## 2017-08-18 ENCOUNTER — Other Ambulatory Visit: Payer: Self-pay | Admitting: Internal Medicine

## 2017-08-23 ENCOUNTER — Ambulatory Visit (INDEPENDENT_AMBULATORY_CARE_PROVIDER_SITE_OTHER): Payer: Self-pay | Admitting: Cardiology

## 2017-09-29 ENCOUNTER — Ambulatory Visit (INDEPENDENT_AMBULATORY_CARE_PROVIDER_SITE_OTHER): Payer: Self-pay

## 2017-09-29 ENCOUNTER — Other Ambulatory Visit (INDEPENDENT_AMBULATORY_CARE_PROVIDER_SITE_OTHER): Payer: Self-pay

## 2017-09-29 ENCOUNTER — Ambulatory Visit
Admission: RE | Admit: 2017-09-29 | Discharge: 2017-09-29 | Disposition: A | Discharge status: Home / Self Care | Payer: Medicare Other | Attending: Cardiovascular Disease | Admitting: Cardiovascular Disease

## 2017-09-29 DIAGNOSIS — I480 Paroxysmal atrial fibrillation: Secondary | ICD-10-CM | POA: Insufficient documentation

## 2017-09-29 DIAGNOSIS — I517 Cardiomegaly: Secondary | ICD-10-CM | POA: Insufficient documentation

## 2017-09-29 DIAGNOSIS — I420 Dilated cardiomyopathy: Secondary | ICD-10-CM | POA: Insufficient documentation

## 2017-09-29 DIAGNOSIS — Z9889 Other specified postprocedural states: Secondary | ICD-10-CM | POA: Insufficient documentation

## 2017-09-29 DIAGNOSIS — I77810 Thoracic aortic ectasia: Secondary | ICD-10-CM | POA: Insufficient documentation

## 2017-09-29 DIAGNOSIS — I5022 Chronic systolic (congestive) heart failure: Secondary | ICD-10-CM | POA: Insufficient documentation

## 2017-09-29 DIAGNOSIS — I083 Combined rheumatic disorders of mitral, aortic and tricuspid valves: Secondary | ICD-10-CM | POA: Insufficient documentation

## 2017-09-29 DIAGNOSIS — I5189 Other ill-defined heart diseases: Secondary | ICD-10-CM | POA: Insufficient documentation

## 2017-09-29 DIAGNOSIS — Z95810 Presence of automatic (implantable) cardiac defibrillator: Secondary | ICD-10-CM | POA: Insufficient documentation

## 2017-10-22 LAB — VAHRT HISTORIC LVEF: Ejection Fraction: 50 %

## 2017-10-25 ENCOUNTER — Ambulatory Visit (INDEPENDENT_AMBULATORY_CARE_PROVIDER_SITE_OTHER): Payer: Self-pay | Admitting: Cardiology

## 2018-01-31 ENCOUNTER — Ambulatory Visit (INDEPENDENT_AMBULATORY_CARE_PROVIDER_SITE_OTHER): Payer: Self-pay | Admitting: Cardiology

## 2018-02-16 ENCOUNTER — Other Ambulatory Visit (INDEPENDENT_AMBULATORY_CARE_PROVIDER_SITE_OTHER): Payer: Self-pay | Admitting: Cardiology

## 2018-02-17 LAB — BASIC METABOLIC PANEL
BUN / Creatinine Ratio: 18 (calc) (ref 6–22)
BUN: 28 mg/dL — ABNORMAL HIGH (ref 7–25)
CO2: 29 mmol/L (ref 20–32)
Calcium: 9.3 mg/dL (ref 8.6–10.4)
Chloride: 105 mmol/L (ref 98–110)
Creatinine: 1.58 mg/dL — ABNORMAL HIGH (ref 0.60–0.88)
EGFR African American: 35 mL/min/{1.73_m2} — ABNORMAL LOW (ref >=60)
Glucose: 82 mg/dL (ref 65–99)
NON-AFRICAN AMERICA EGFR: 30 mL/min/{1.73_m2} — ABNORMAL LOW (ref >=60)
Potassium: 4.7 mmol/L (ref 3.5–5.3)
Sodium: 140 mmol/L (ref 135–146)

## 2018-03-07 ENCOUNTER — Ambulatory Visit (INDEPENDENT_AMBULATORY_CARE_PROVIDER_SITE_OTHER): Payer: Self-pay | Admitting: Cardiology

## 2018-03-07 ENCOUNTER — Other Ambulatory Visit: Payer: Self-pay | Admitting: Cardiology

## 2018-03-07 DIAGNOSIS — R0989 Other specified symptoms and signs involving the circulatory and respiratory systems: Secondary | ICD-10-CM

## 2018-03-09 ENCOUNTER — Ambulatory Visit
Admission: RE | Admit: 2018-03-09 | Discharge: 2018-03-09 | Disposition: A | Discharge status: Home / Self Care | Payer: Medicare Other | Source: Ambulatory Visit | Attending: Cardiology | Admitting: Cardiology

## 2018-03-09 DIAGNOSIS — R0989 Other specified symptoms and signs involving the circulatory and respiratory systems: Secondary | ICD-10-CM

## 2018-03-09 DIAGNOSIS — I6523 Occlusion and stenosis of bilateral carotid arteries: Secondary | ICD-10-CM | POA: Insufficient documentation

## 2018-04-20 ENCOUNTER — Ambulatory Visit (INDEPENDENT_AMBULATORY_CARE_PROVIDER_SITE_OTHER): Payer: Self-pay | Admitting: Cardiology

## 2018-05-09 ENCOUNTER — Other Ambulatory Visit (INDEPENDENT_AMBULATORY_CARE_PROVIDER_SITE_OTHER): Payer: Self-pay | Admitting: Cardiology

## 2018-05-10 LAB — BASIC METABOLIC PANEL
BUN / Creatinine Ratio: 22 (calc) (ref 6–22)
BUN: 29 mg/dL — ABNORMAL HIGH (ref 7–25)
CO2: 30 mmol/L (ref 20–32)
Calcium: 9.4 mg/dL (ref 8.6–10.4)
Chloride: 103 mmol/L (ref 98–110)
Creatinine: 1.32 mg/dL — ABNORMAL HIGH (ref 0.60–0.88)
EGFR African American: 43 mL/min/{1.73_m2} — ABNORMAL LOW (ref >=60)
Glucose: 73 mg/dL (ref 65–99)
NON-AFRICAN AMERICA EGFR: 37 mL/min/{1.73_m2} — ABNORMAL LOW (ref >=60)
Potassium: 4.4 mmol/L (ref 3.5–5.3)
Sodium: 139 mmol/L (ref 135–146)

## 2018-05-10 LAB — B-TYPE NATRIURETIC PEPTIDE: B-Natriuretic Peptide: 276 pg/mL — ABNORMAL HIGH (ref <100)

## 2018-06-16 ENCOUNTER — Ambulatory Visit (INDEPENDENT_AMBULATORY_CARE_PROVIDER_SITE_OTHER): Payer: Self-pay

## 2018-08-24 ENCOUNTER — Ambulatory Visit (INDEPENDENT_AMBULATORY_CARE_PROVIDER_SITE_OTHER): Payer: Self-pay | Admitting: Cardiology

## 2018-10-14 ENCOUNTER — Other Ambulatory Visit: Payer: Self-pay | Admitting: Cardiology

## 2018-10-14 DIAGNOSIS — I420 Dilated cardiomyopathy: Secondary | ICD-10-CM

## 2018-12-09 ENCOUNTER — Ambulatory Visit
Admission: RE | Admit: 2018-12-09 | Discharge: 2018-12-09 | Disposition: A | Discharge status: Home / Self Care | Payer: Medicare Other | Source: Ambulatory Visit | Attending: Cardiology | Admitting: Cardiology

## 2018-12-09 ENCOUNTER — Other Ambulatory Visit (INDEPENDENT_AMBULATORY_CARE_PROVIDER_SITE_OTHER): Payer: Self-pay

## 2018-12-09 ENCOUNTER — Ambulatory Visit: Payer: Medicare Other

## 2018-12-09 DIAGNOSIS — I480 Paroxysmal atrial fibrillation: Secondary | ICD-10-CM | POA: Insufficient documentation

## 2018-12-09 DIAGNOSIS — I517 Cardiomegaly: Secondary | ICD-10-CM | POA: Insufficient documentation

## 2018-12-09 DIAGNOSIS — Z9889 Other specified postprocedural states: Secondary | ICD-10-CM | POA: Insufficient documentation

## 2018-12-09 DIAGNOSIS — I420 Dilated cardiomyopathy: Secondary | ICD-10-CM | POA: Insufficient documentation

## 2018-12-09 DIAGNOSIS — I083 Combined rheumatic disorders of mitral, aortic and tricuspid valves: Secondary | ICD-10-CM | POA: Insufficient documentation

## 2018-12-09 DIAGNOSIS — Z95818 Presence of other cardiac implants and grafts: Secondary | ICD-10-CM | POA: Insufficient documentation

## 2018-12-13 LAB — VAHRT HISTORIC LVEF: Ejection Fraction: 60 %

## 2018-12-14 ENCOUNTER — Ambulatory Visit (INDEPENDENT_AMBULATORY_CARE_PROVIDER_SITE_OTHER): Payer: Self-pay | Admitting: Cardiology

## 2019-10-29 ENCOUNTER — Encounter (INDEPENDENT_AMBULATORY_CARE_PROVIDER_SITE_OTHER): Payer: Medicare Other | Admitting: Cardiovascular Disease

## 2019-10-29 DIAGNOSIS — Z95810 Presence of automatic (implantable) cardiac defibrillator: Secondary | ICD-10-CM

## 2019-10-30 ENCOUNTER — Other Ambulatory Visit: Payer: Self-pay | Admitting: Nurse Practitioner

## 2019-10-30 DIAGNOSIS — Z8781 Personal history of (healed) traumatic fracture: Secondary | ICD-10-CM

## 2019-10-30 DIAGNOSIS — M419 Scoliosis, unspecified: Secondary | ICD-10-CM

## 2019-11-02 ENCOUNTER — Ambulatory Visit
Admission: RE | Admit: 2019-11-02 | Discharge: 2019-11-02 | Disposition: A | Discharge status: Home / Self Care | Payer: Medicare Other | Source: Ambulatory Visit | Attending: Nurse Practitioner | Admitting: Nurse Practitioner

## 2019-11-02 ENCOUNTER — Other Ambulatory Visit (HOSPITAL_COMMUNITY): Payer: Self-pay | Admitting: Physical Medicine and Rehabilitation

## 2019-11-02 ENCOUNTER — Other Ambulatory Visit: Payer: Self-pay | Admitting: Physical Medicine and Rehabilitation

## 2019-11-02 DIAGNOSIS — M5414 Radiculopathy, thoracic region: Secondary | ICD-10-CM

## 2019-11-02 DIAGNOSIS — Z8781 Personal history of (healed) traumatic fracture: Secondary | ICD-10-CM

## 2019-11-02 DIAGNOSIS — M419 Scoliosis, unspecified: Secondary | ICD-10-CM | POA: Insufficient documentation

## 2019-11-02 DIAGNOSIS — M5134 Other intervertebral disc degeneration, thoracic region: Secondary | ICD-10-CM

## 2019-11-06 ENCOUNTER — Other Ambulatory Visit: Payer: Self-pay | Admitting: Nurse Practitioner

## 2019-11-06 DIAGNOSIS — Z8781 Personal history of (healed) traumatic fracture: Secondary | ICD-10-CM

## 2019-11-07 ENCOUNTER — Ambulatory Visit
Admission: RE | Admit: 2019-11-07 | Discharge: 2019-11-07 | Disposition: A | Discharge status: Home / Self Care | Payer: Medicare Other | Source: Ambulatory Visit | Attending: Physical Medicine and Rehabilitation | Admitting: Physical Medicine and Rehabilitation

## 2019-11-07 ENCOUNTER — Other Ambulatory Visit: Payer: Self-pay

## 2019-11-07 DIAGNOSIS — M5134 Other intervertebral disc degeneration, thoracic region: Secondary | ICD-10-CM | POA: Diagnosis present

## 2019-11-07 DIAGNOSIS — M5414 Radiculopathy, thoracic region: Secondary | ICD-10-CM | POA: Diagnosis not present

## 2019-11-13 ENCOUNTER — Ambulatory Visit
Admission: RE | Admit: 2019-11-13 | Discharge: 2019-11-13 | Disposition: A | Discharge status: Home / Self Care | Payer: Medicare Other | Source: Ambulatory Visit | Attending: Oncology | Admitting: Oncology

## 2019-11-13 ENCOUNTER — Inpatient Hospital Stay: Payer: Medicare Other | Attending: Oncology | Admitting: Oncology

## 2019-11-13 ENCOUNTER — Other Ambulatory Visit: Payer: Self-pay

## 2019-11-13 ENCOUNTER — Inpatient Hospital Stay: Payer: Medicare Other

## 2019-11-13 ENCOUNTER — Encounter: Payer: Self-pay | Admitting: Oncology

## 2019-11-13 VITALS — BP 95/64 | HR 80 | Temp 98.0°F | Resp 16 | Wt 142.4 lb

## 2019-11-13 DIAGNOSIS — R778 Other specified abnormalities of plasma proteins: Secondary | ICD-10-CM

## 2019-11-13 DIAGNOSIS — I5022 Chronic systolic (congestive) heart failure: Secondary | ICD-10-CM | POA: Insufficient documentation

## 2019-11-13 DIAGNOSIS — D509 Iron deficiency anemia, unspecified: Secondary | ICD-10-CM | POA: Diagnosis not present

## 2019-11-13 DIAGNOSIS — E039 Hypothyroidism, unspecified: Secondary | ICD-10-CM

## 2019-11-13 DIAGNOSIS — D7589 Other specified diseases of blood and blood-forming organs: Secondary | ICD-10-CM | POA: Diagnosis not present

## 2019-11-13 DIAGNOSIS — N183 Chronic kidney disease, stage 3 unspecified: Secondary | ICD-10-CM | POA: Diagnosis not present

## 2019-11-13 DIAGNOSIS — I4891 Unspecified atrial fibrillation: Secondary | ICD-10-CM

## 2019-11-13 DIAGNOSIS — M4854XS Collapsed vertebra, not elsewhere classified, thoracic region, sequela of fracture: Secondary | ICD-10-CM | POA: Diagnosis not present

## 2019-11-13 LAB — COMPREHENSIVE METABOLIC PANEL
ALT: 20 U/L (ref 0–44)
AST: 29 U/L (ref 15–41)
Albumin: 4.1 g/dL (ref 3.5–5.0)
Alkaline Phosphatase: 78 U/L (ref 38–126)
Anion gap: 9 (ref 5–15)
BUN: 34 mg/dL — ABNORMAL HIGH (ref 8–23)
CO2: 28 mmol/L (ref 22–32)
Calcium: 9.1 mg/dL (ref 8.9–10.3)
Chloride: 101 mmol/L (ref 98–111)
Creatinine, Ser: 1.53 mg/dL — ABNORMAL HIGH (ref 0.44–1.00)
GFR calc Af Amer: 36 mL/min — ABNORMAL LOW (ref >60)
GFR calc non Af Amer: 31 mL/min — ABNORMAL LOW (ref >60)
Glucose, Bld: 86 mg/dL (ref 70–99)
Potassium: 4.3 mmol/L (ref 3.5–5.1)
Sodium: 138 mmol/L (ref 135–145)
Total Bilirubin: 0.8 mg/dL (ref 0.3–1.2)
Total Protein: 7.2 g/dL (ref 6.5–8.1)

## 2019-11-13 LAB — FOLATE: Folate: 34 ng/mL (ref >5.9)

## 2019-11-13 LAB — TECHNOLOGIST SMEAR REVIEW: Plt Morphology: NORMAL

## 2019-11-13 LAB — CBC WITH DIFFERENTIAL/PLATELET
Abs Immature Granulocytes: 0.03 10*3/uL (ref 0.00–0.07)
Basophils Absolute: 0 10*3/uL (ref 0.0–0.1)
Basophils Relative: 1 %
Eosinophils Absolute: 0.1 10*3/uL (ref 0.0–0.5)
Eosinophils Relative: 2 %
HCT: 35.5 % — ABNORMAL LOW (ref 36.0–46.0)
Hemoglobin: 11.3 g/dL — ABNORMAL LOW (ref 12.0–15.0)
Immature Granulocytes: 1 %
Lymphocytes Relative: 24 %
Lymphs Abs: 1.5 10*3/uL (ref 0.7–4.0)
MCH: 33.2 pg (ref 26.0–34.0)
MCHC: 31.8 g/dL (ref 30.0–36.0)
MCV: 104.4 fL — ABNORMAL HIGH (ref 80.0–100.0)
Monocytes Absolute: 0.5 10*3/uL (ref 0.1–1.0)
Monocytes Relative: 9 %
Neutro Abs: 4 10*3/uL (ref 1.7–7.7)
Neutrophils Relative %: 63 %
Platelets: 171 10*3/uL (ref 150–400)
RBC: 3.4 MIL/uL — ABNORMAL LOW (ref 3.87–5.11)
RDW: 14.2 % (ref 11.5–15.5)
WBC: 6.2 10*3/uL (ref 4.0–10.5)
nRBC: 0 % (ref 0.0–0.2)

## 2019-11-13 LAB — VITAMIN B12: Vitamin B-12: 560 pg/mL (ref 180–914)

## 2019-11-13 LAB — VITAMIN D 25 HYDROXY (VIT D DEFICIENCY, FRACTURES): Vit D, 25-Hydroxy: 49.58 ng/mL (ref 30–100)

## 2019-11-13 NOTE — Progress Notes (Signed)
Hematology/Oncology Consult note [facility] Telephone:([phone] [phone]   Patient Care Team: Leonel Ramsay, MD as PCP - General (Infectious Diseases)  REFERRING PROVIDER: Leonel Ramsay, MD  CHIEF COMPLAINTS/REASON FOR VISIT:  Evaluation of abnormal SPEP  HISTORY OF PRESENTING ILLNESS:   Mackenzie Walsh is a  86 y.o.  female with PMH listed below was seen in consultation at the request of  Leonel Ramsay, MD  for evaluation of abnormal SPEP Patient was recently seen by Dr. Lacinda Axon neurosurgery for thoracic back pain with radiculopathy.  Patient has scoliosis, 3 weeks ago she started to feel severe upper back pain. X-ray of thoracic spine on 10/17/2019 showed multiple thoracic compression fractures.   11/07/2019 CT thoracic spine without contrast showed old and healed compression deformities at T1, T8 and T9.  Potentially more recent compression fracture at T3 and T7. 10/17/2019, patient had blood work-up including SPEP which showed  M spike of 3.7. CBC showed hemoglobin 11.5, MCV 107, platelet count 141.  No recent CMP Patient has CKD with creatinine baseline around 1.5.  Patient has multiple comorbidities including dilated cardiomyopathy, ventricular tachycardia, implantable cardioverter, chronic systolic CHF, CKD stage III, compression fracture, scoliosis deformity of the spine, mitral valve regurgitation, celiac disease, hypothyroidism, hypercholesterolemia, atrial fibrillation on anticoagulation, osteoporosis. She lives with her husband who has Alzheimer's disease.  Her children live in [place].  Her son lives about 30 minutes away from her home.  She gets help for grocery shopping.  She still drives herself and her husband to medical appointments. Review of Systems  Constitutional: Positive for fatigue. Negative for appetite change, chills and fever.  HENT:   Negative for hearing loss and voice change.   Eyes: Negative for eye  problems.  Respiratory: Negative for chest tightness and cough.   Cardiovascular: Negative for chest pain.  Gastrointestinal: Negative for abdominal distention, abdominal pain and blood in stool.  Endocrine: Negative for hot flashes.  Genitourinary: Negative for difficulty urinating and frequency.   Musculoskeletal: Positive for back pain. Negative for arthralgias.  Skin: Negative for itching and rash.  Neurological: Negative for extremity weakness.  Hematological: Negative for adenopathy.  Psychiatric/Behavioral: Negative for confusion.    MEDICAL HISTORY:  Past Medical History:  Diagnosis Date  . A-fib ([place])   . Allergy   . Anemia   . Arrhythmia   . Atrial fibrillation ([place])   . Broken femur ([place]) 2017  . Cataract   . Celiac disease   . Chronic pain syndrome   . Chronic systolic CHF (congestive heart failure) ([place])   . CKD (chronic kidney disease)   . COPD (chronic obstructive pulmonary disease) ([place])   . Dilated cardiomyopathy ([place])   . Hyperlipidemia   . Hypertension   . Migraines   . Mitral valve regurgitation   . Osteoporosis   . Posterior rhinorrhea   . Scoliosis deformity of spine   . Senile hyperkeratosis   . Thyroid disease    hypothyroid  . Ventricular tachyarrhythmia ([place])     SURGICAL HISTORY: Past Surgical History:  Procedure Laterality Date  . CARDIAC SURGERY    . PACEMAKER IMPLANT      SOCIAL HISTORY: Social History   Socioeconomic History  . Marital status: Married    Spouse name: Not on file  . Number of children: Not on file  . Years of education: Not on file  . Highest education level: Not on file  Occupational History  . Not on file  Tobacco  Use  . Smoking status: Never Smoker  Substance and Sexual Activity  . Alcohol use: Not on file  . Drug use: Never  . Sexual activity: Not on file  Other Topics Concern  . Not on file  Social History Narrative  . Not on file   Social Determinants of Health   Financial Resource Strain:     . Difficulty of Paying Living Expenses:   Food Insecurity:   . Worried About Charity fundraiser in the Last Year:   . Arboriculturist in the Last Year:   Transportation Needs:   . Film/video editor (Medical):   Marland Kitchen Lack of Transportation (Non-Medical):   Physical Activity:   . Days of Exercise per Week:   . Minutes of Exercise per Session:   Stress:   . Feeling of Stress :   Social Connections:   . Frequency of Communication with Friends and Family:   . Frequency of Social Gatherings with Friends and Family:   . Attends Religious Services:   . Active Member of Clubs or Organizations:   . Attends Archivist Meetings:   Marland Kitchen Marital Status:   Intimate Partner Violence:   . Fear of Current or Ex-Partner:   . Emotionally Abused:   Marland Kitchen Physically Abused:   . Sexually Abused:     FAMILY HISTORY: Family History  Problem Relation Age of Onset  . Pneumonia Mother   . Stroke Father     ALLERGIES:  is allergic to erythromycin base; gluten meal; and dofetilide.  MEDICATIONS:  Current Outpatient Medications  Medication Sig Dispense Refill  . Acetaminophen 500 MG capsule Take 500 mg by mouth 3 times/day as needed-between meals & bedtime.    Marland Kitchen amiodarone (PACERONE) 200 MG tablet amiodarone 200 mg tablet    . CALCIUM CITRATE PO Take 600 mg by mouth 2 (two) times daily as needed.    . carvedilol (COREG) 12.5 MG tablet carvedilol 12.5 mg tablet    . CITRACAL MAXIMUM 315-250 MG-UNIT TABS     . Coenzyme Q10 (COQ-10) 100 MG CAPS CoQ-10  1 daily    . fluticasone (FLONASE) 50 MCG/ACT nasal spray SMARTSIG:1-2 Spray(s) Both Nares Daily    . gabapentin (NEURONTIN) 100 MG capsule gabapentin 100 mg capsule  take 1 cap 3 times daily for chronic pain    . levothyroxine (SYNTHROID) 112 MCG tablet levothyroxine 112 mcg tablet  Take 1 each morning > 30 minutes before first meal    . losartan (COZAAR) 25 MG tablet losartan 25 mg tablet  Take 1 tablet every day by oral route as directed.     . Magnesium Oxide 200 MG TABS magnesium 200 mg (as magnesium oxide) tablet  Take every day by oral route.    . Multiple Vitamin (MULTI-VITAMIN DAILY PO) Multi Vitamin  once a day with food    . Multiple Vitamins-Minerals (MULTIPLE VIT/MINERALS/NO IRON) TABS     . Rivaroxaban (XARELTO) 15 MG TABS tablet Xarelto 15 mg tablet    . spironolactone (ALDACTONE) 25 MG tablet 12.5 mg.     No current facility-administered medications for this visit.     PHYSICAL EXAMINATION: ECOG PERFORMANCE STATUS: 1 - Symptomatic but completely ambulatory Vitals:   11/13/19 1036  BP: 95/64  Pulse: 80  Resp: 16  Temp: 98 F (36.7 C)  SpO2: 97%   Filed Weights   11/13/19 1036  Weight: 142 lb 6.4 oz (64.6 kg)    Physical Exam Constitutional:  General: She is not in acute distress.    Comments: Frail appearance  HENT:     Head: Normocephalic and atraumatic.  Eyes:     General: No scleral icterus. Cardiovascular:     Rate and Rhythm: Normal rate and regular rhythm.     Heart sounds: Normal heart sounds.  Pulmonary:     Effort: Pulmonary effort is normal. No respiratory distress.     Breath sounds: No wheezing.  Abdominal:     General: Bowel sounds are normal. There is no distension.     Palpations: Abdomen is soft.  Musculoskeletal:        General: Normal range of motion.     Cervical back: Normal range of motion and neck supple.     Comments: Scoliosis of the spine  Skin:    General: Skin is warm and dry.     Findings: No erythema or rash.  Neurological:     Mental Status: She is alert and oriented to person, place, and time. Mental status is at baseline.     Cranial Nerves: No cranial nerve deficit.     Coordination: Coordination normal.  Psychiatric:        Mood and Affect: Mood normal.     LABORATORY DATA:  I have reviewed the data as listed Lab Results  Component Value Date   WBC 6.2 11/13/2019   HGB 11.3 (L) 11/13/2019   HCT 35.5 (L) 11/13/2019   MCV 104.4 (H)  11/13/2019   PLT 171 11/13/2019   Recent Labs    11/13/19 1106  NA 138  K 4.3  CL 101  CO2 28  GLUCOSE 86  BUN 34*  CREATININE 1.53*  CALCIUM 9.1  GFRNONAA 31*  GFRAA 36*  PROT 7.2  ALBUMIN 4.1  AST 29  ALT 20  ALKPHOS 78  BILITOT 0.8   Iron/TIBC/Ferritin/ %Sat No results found for: IRON, TIBC, FERRITIN, IRONPCTSAT    RADIOGRAPHIC STUDIES: I have personally reviewed the radiological images as listed and agreed with the findings in the report. CT THORACIC SPINE WO CONTRAST  Result Date: 11/07/2019 CLINICAL DATA:  Back pain.  Assess for fracture. EXAM: CT THORACIC SPINE WITHOUT CONTRAST TECHNIQUE: Multidetector CT images of the thoracic were obtained using the standard protocol without intravenous contrast. COMPARISON:  Radiography 11/02/2019. FINDINGS: Alignment: Mild scoliotic curvature convex to the right in the upper thoracic region and to the left in the lower thoracic region. Increased thoracic kyphotic curvature. Vertebrae: L1 superior endplate fracture with loss of height of 25%. This looks old and healed. T3 superior endplate fracture with loss of height of 25%. This is age indeterminate and could be more recent. Endplate Schmorl's nodes at T5-6 and T6-7. T7 compression fracture with loss of height of 60%. Posterior bowing of the posterior margin of the vertebral body by 3 mm. This fracture is of indeterminate age but could be recent. T8 shows compression fracture with loss of height of 50%. No retropulsion. This looks old and healed. T9 shows a compression fracture with loss of height of 50%. No retropulsed bone. This looks old and healed. T10, T11 and T12 are negative. Paraspinal and other soft tissues: Negative Disc levels: No significant disc pathology suspected. No compressive stenosis of the canal. Foramina appear sufficiently patent. IMPRESSION: Clearly old and healed compression deformities at T1, T8 and T9. Potentially more recent compression fractures at T3 and T7.  Loss of height at T3 of 25%. Loss of height at T7 of 60%. Mild retropulsion at inferior  T7 but without significant canal compromise. MRI or bone scan could conclusively determine if these fractures are more recent or incompletely healed. Electronically Signed   By: Nelson Chimes M.D.   On: 11/07/2019 14:11   DG SCOLIOSIS EVAL COMPLETE SPINE 2 OR 3 VIEWS  Result Date: 11/02/2019 CLINICAL DATA:  Worsening back pain. Kyphoscoliosis. EXAM: DG SCOLIOSIS EVAL COMPLETE SPINE 2-3V COMPARISON:  Thoracic spine report dated 10/17/2019 at [facility]. FINDINGS: 24 degrees of levoconvex scoliosis with its apex at the T12-L1 level. 35 degrees of dextroconvex scoliosis with its apex at the L2-3 level. Multiple thoracic vertebral compression deformities. No acute fracture lines or bony retropulsion visualized. IMPRESSION: 1. Moderate to marked scoliosis, as described above. 2. Multiple thoracic vertebral compression deformities. Electronically Signed   By: Claudie Revering M.D.   On: 11/02/2019 20:13      ASSESSMENT & PLAN:  1. Abnormal SPEP   2. Macrocytosis    #Abnormal SPEP, Previous labs were discussed with patient.  Suspect plasma cell disorders. I recommend check CBC, CMP, smear, multiple myeloma panel, maturation, UPEP with immunofixation. Beta microglobulin level. Check skeletal survey. I also check vitamin D level.  #Chronic microcytic anemia, check vitamin B12 and folate level.  Further work-up pending above results.  Follow-up to be determined.  Orders Placed This Encounter  Procedures  . DG Bone Survey Met    Standing Status:   Future    Number of Occurrences:   1    Standing Expiration Date:   11/12/2020    Order Specific Question:   Reason for Exam (SYMPTOM  OR DIAGNOSIS REQUIRED)    Answer:   abnormal SPEP    Order Specific Question:   Preferred imaging location?    Answer:   [facility]    Order Specific Question:   Radiology Contrast Protocol - do NOT remove file path    Answer:    [email or web address]  . Multiple Myeloma Panel (SPEP&IFE w/QIG)    Standing Status:   Future    Number of Occurrences:   1    Standing Expiration Date:   05/15/2020  . Kappa/lambda light chains    Standing Status:   Future    Number of Occurrences:   1    Standing Expiration Date:   11/12/2020  . CBC with Differential/Platelet    Standing Status:   Future    Number of Occurrences:   1    Standing Expiration Date:   11/12/2020  . Comprehensive metabolic panel    Standing Status:   Future    Number of Occurrences:   1    Standing Expiration Date:   11/12/2020  . IFE and PE, Random Urine    Standing Status:   Future    Number of Occurrences:   1    Standing Expiration Date:   11/12/2020  . Folate    Standing Status:   Future    Number of Occurrences:   1    Standing Expiration Date:   11/12/2020  . Vitamin B12    Standing Status:   Future    Number of Occurrences:   1    Standing Expiration Date:   11/12/2020  . Technologist smear review    Standing Status:   Future    Number of Occurrences:   1    Standing Expiration Date:   11/12/2020  . Beta 2 microglobuline, serum    Standing Status:   Future    Number of Occurrences:   1  Standing Expiration Date:   11/12/2020  . VITAMIN D 25 Hydroxy (Vit-D Deficiency, Fractures)    Standing Status:   Future    Number of Occurrences:   1    Standing Expiration Date:   11/12/2020    All questions were answered. The patient knows to call the clinic with any problems questions or concerns.  cc Leonel Ramsay, MD    Return of visit: To be determined Thank you for this kind referral and the opportunity to participate in the care of this patient. A copy of today's note is routed to referring provider    Earlie Server, MD, PhD Hematology Oncology [facility] [place]- [phone] 11/13/2019   #11/21/2019, labs are reviewed and discussed with patient. Patient has no M  protein in blood or urine.  Serum light chain ratio was also normal No need for additional work-up at this point. Patient does seems to have chronic macrocytosis with mild anemia.  Normal vitamin B12 and folate level. Patient may have underlying myelodysplastic syndrome.  Bone marrow biopsy versus continue watchful waiting options discussed with patient.  She prefers to continue watchful waiting and I think it is reasonable given her age group and the mild degree of anemia and stability since 6 months ago. Recommend patient follow-up with me in 6 months.  She agrees with the plan.  Earlie Server .

## 2019-11-13 NOTE — Progress Notes (Signed)
Patient here for inital oncology appointment, expresses no complaints or concerns at this time.

## 2019-11-14 ENCOUNTER — Encounter: Payer: Self-pay | Admitting: Oncology

## 2019-11-14 LAB — BETA 2 MICROGLOBULIN, SERUM: Beta-2 Microglobulin: 2.9 mg/L — ABNORMAL HIGH (ref 0.6–2.4)

## 2019-11-14 LAB — IFE AND PE, RANDOM URINE
% BETA, Urine: 16.3 %
ALPHA 1 URINE: 6 %
Albumin, U: 60.4 %
Alpha 2, Urine: 8.7 %
GAMMA GLOBULIN URINE: 8.6 %
Total Protein, Urine: 79.3 mg/dL

## 2019-11-14 LAB — KAPPA/LAMBDA LIGHT CHAINS
Kappa free light chain: 16.4 mg/L (ref 3.3–19.4)
Kappa, lambda light chain ratio: 1.1 (ref 0.26–1.65)
Lambda free light chains: 14.9 mg/L (ref 5.7–26.3)

## 2019-11-15 LAB — MULTIPLE MYELOMA PANEL, SERUM
Albumin SerPl Elph-Mcnc: 3.7 g/dL (ref 2.9–4.4)
Albumin/Glob SerPl: 1.3 (ref 0.7–1.7)
Alpha 1: 0.3 g/dL (ref 0.0–0.4)
Alpha2 Glob SerPl Elph-Mcnc: 0.8 g/dL (ref 0.4–1.0)
B-Globulin SerPl Elph-Mcnc: 1.1 g/dL (ref 0.7–1.3)
Gamma Glob SerPl Elph-Mcnc: 0.7 g/dL (ref 0.4–1.8)
Globulin, Total: 2.9 g/dL (ref 2.2–3.9)
IgA: 166 mg/dL (ref 64–422)
IgG (Immunoglobin G), Serum: 745 mg/dL (ref 586–1602)
IgM (Immunoglobulin M), Srm: 60 mg/dL (ref 26–217)
Total Protein ELP: 6.6 g/dL (ref 6.0–8.5)

## 2019-11-21 ENCOUNTER — Encounter: Payer: Self-pay | Admitting: Oncology

## 2019-11-22 ENCOUNTER — Telehealth: Payer: Self-pay | Admitting: *Deleted

## 2019-11-22 NOTE — Telephone Encounter (Signed)
Lab/MD in 6 months per MD 11/21/19 secure chat message appts were scheduled as requested I  was unable to reach pt by phone to make her aware. A reminder letter was mailed out making her aware of her upcoming appts.

## 2020-01-15 ENCOUNTER — Inpatient Hospital Stay
Admission: EM | Admit: 2020-01-15 | Discharge: 2020-01-17 | DRG: 189 | Disposition: A | Discharge status: Home Health | Payer: Medicare Other | Attending: Family Medicine | Admitting: Family Medicine

## 2020-01-15 ENCOUNTER — Emergency Department: Payer: Medicare Other

## 2020-01-15 ENCOUNTER — Other Ambulatory Visit: Payer: Self-pay

## 2020-01-15 ENCOUNTER — Encounter: Payer: Self-pay | Admitting: Emergency Medicine

## 2020-01-15 DIAGNOSIS — Z7901 Long term (current) use of anticoagulants: Secondary | ICD-10-CM

## 2020-01-15 DIAGNOSIS — J449 Chronic obstructive pulmonary disease, unspecified: Secondary | ICD-10-CM | POA: Diagnosis present

## 2020-01-15 DIAGNOSIS — J9601 Acute respiratory failure with hypoxia: Secondary | ICD-10-CM | POA: Diagnosis not present

## 2020-01-15 DIAGNOSIS — E039 Hypothyroidism, unspecified: Secondary | ICD-10-CM | POA: Diagnosis present

## 2020-01-15 DIAGNOSIS — D6859 Other primary thrombophilia: Secondary | ICD-10-CM | POA: Diagnosis present

## 2020-01-15 DIAGNOSIS — I130 Hypertensive heart and chronic kidney disease with heart failure and stage 1 through stage 4 chronic kidney disease, or unspecified chronic kidney disease: Secondary | ICD-10-CM | POA: Diagnosis present

## 2020-01-15 DIAGNOSIS — M4854XA Collapsed vertebra, not elsewhere classified, thoracic region, initial encounter for fracture: Secondary | ICD-10-CM | POA: Diagnosis present

## 2020-01-15 DIAGNOSIS — Z79899 Other long term (current) drug therapy: Secondary | ICD-10-CM

## 2020-01-15 DIAGNOSIS — Z8249 Family history of ischemic heart disease and other diseases of the circulatory system: Secondary | ICD-10-CM

## 2020-01-15 DIAGNOSIS — S0990XA Unspecified injury of head, initial encounter: Secondary | ICD-10-CM | POA: Diagnosis present

## 2020-01-15 DIAGNOSIS — Z881 Allergy status to other antibiotic agents status: Secondary | ICD-10-CM

## 2020-01-15 DIAGNOSIS — I480 Paroxysmal atrial fibrillation: Secondary | ICD-10-CM | POA: Diagnosis present

## 2020-01-15 DIAGNOSIS — M810 Age-related osteoporosis without current pathological fracture: Secondary | ICD-10-CM | POA: Diagnosis present

## 2020-01-15 DIAGNOSIS — Z888 Allergy status to other drugs, medicaments and biological substances status: Secondary | ICD-10-CM

## 2020-01-15 DIAGNOSIS — Z8262 Family history of osteoporosis: Secondary | ICD-10-CM

## 2020-01-15 DIAGNOSIS — Y92009 Unspecified place in unspecified non-institutional (private) residence as the place of occurrence of the external cause: Secondary | ICD-10-CM

## 2020-01-15 DIAGNOSIS — Z823 Family history of stroke: Secondary | ICD-10-CM

## 2020-01-15 DIAGNOSIS — Z9981 Dependence on supplemental oxygen: Secondary | ICD-10-CM

## 2020-01-15 DIAGNOSIS — Z79890 Hormone replacement therapy (postmenopausal): Secondary | ICD-10-CM

## 2020-01-15 DIAGNOSIS — Z20822 Contact with and (suspected) exposure to covid-19: Secondary | ICD-10-CM | POA: Diagnosis present

## 2020-01-15 DIAGNOSIS — R0902 Hypoxemia: Secondary | ICD-10-CM

## 2020-01-15 DIAGNOSIS — Z950 Presence of cardiac pacemaker: Secondary | ICD-10-CM

## 2020-01-15 DIAGNOSIS — Y92010 Kitchen of single-family (private) house as the place of occurrence of the external cause: Secondary | ICD-10-CM

## 2020-01-15 DIAGNOSIS — M40209 Unspecified kyphosis, site unspecified: Secondary | ICD-10-CM | POA: Diagnosis present

## 2020-01-15 DIAGNOSIS — N1831 Chronic kidney disease, stage 3a: Secondary | ICD-10-CM | POA: Diagnosis present

## 2020-01-15 DIAGNOSIS — Z9109 Other allergy status, other than to drugs and biological substances: Secondary | ICD-10-CM

## 2020-01-15 DIAGNOSIS — R0781 Pleurodynia: Secondary | ICD-10-CM

## 2020-01-15 DIAGNOSIS — W19XXXA Unspecified fall, initial encounter: Secondary | ICD-10-CM | POA: Diagnosis present

## 2020-01-15 DIAGNOSIS — I5032 Chronic diastolic (congestive) heart failure: Secondary | ICD-10-CM | POA: Diagnosis present

## 2020-01-15 DIAGNOSIS — J9811 Atelectasis: Secondary | ICD-10-CM | POA: Diagnosis present

## 2020-01-15 MED ORDER — ACETAMINOPHEN 325 MG PO TABS
650.0000 mg | ORAL_TABLET | Freq: Once | ORAL | Status: AC
  Administered 2020-01-15: 650 mg via ORAL

## 2020-01-15 MED ORDER — ACETAMINOPHEN 325 MG PO TABS
ORAL_TABLET | ORAL | Status: AC
  Filled 2020-01-15: qty 2

## 2020-01-15 MED ORDER — HYDROCODONE-ACETAMINOPHEN 5-325 MG PO TABS
1.0000 | ORAL_TABLET | Freq: Once | ORAL | Status: AC
  Administered 2020-01-15: 1 via ORAL
  Filled 2020-01-15: qty 1

## 2020-01-15 MED ORDER — ONDANSETRON 4 MG PO TBDP
4.0000 mg | ORAL_TABLET | Freq: Once | ORAL | Status: AC
  Administered 2020-01-15: 4 mg via ORAL
  Filled 2020-01-15: qty 1

## 2020-01-15 NOTE — ED Provider Notes (Signed)
[facility] Emergency Department Provider Note   ____________________________________________   First MD Initiated Contact with Patient 01/15/20 2313     (approximate)  I have reviewed the triage vital signs and the nursing notes.   HISTORY  Chief Complaint Fall and Chest Pain    HPI Mackenzie Walsh is a 86 y.o. female who presents to the ED from home status post mechanical fall with right rib pain.  Patient lost her balance while opening the dishwasher and fell backwards, striking her head and right ribs.  She is on Xarelto.  Denies LOC.  Complains of rib pain and difficulty breathing.  She does have a history of COPD and CHF but is not on home oxygen.  Denies abdominal pain, nausea, vomiting or dizziness.      Past Medical History:  Diagnosis Date  . A-fib (HCC)   . Allergy   . Anemia   . Arrhythmia   . Atrial fibrillation (HCC)   . Broken femur (HCC) 2017  . Cataract   . Celiac disease   . Chronic pain syndrome   . Chronic systolic CHF (congestive heart failure) (HCC)   . CKD (chronic kidney disease)   . COPD (chronic obstructive pulmonary disease) (HCC)   . Dilated cardiomyopathy (HCC)   . Hyperlipidemia   . Hypertension   . Migraines   . Mitral valve regurgitation   . Osteoporosis   . Posterior rhinorrhea   . Scoliosis deformity of spine   . Senile hyperkeratosis   . Thyroid disease    hypothyroid  . Ventricular tachyarrhythmia [facility])     Patient Active Problem List   Diagnosis Date Noted  . COPD with chronic bronchitis (HCC) 01/16/2020  . Chronic diastolic CHF (congestive heart failure) (HCC) 01/16/2020  . Chronic anticoagulation 01/16/2020  . Acute respiratory failure with hypoxia (HCC) 01/16/2020  . Fall at home, initial encounter 01/16/2020  . Compression fracture of thoracic spine, non-traumatic (HCC) 01/16/2020    Past Surgical History:  Procedure Laterality Date  . ablation arrythmia focus    . APPENDECTOMY  1945    . CARDIAC DEFIBRILLATOR PLACEMENT  2016  . CARDIAC SURGERY    . CARDIOVERSION EXTERNAL X4    . PACEMAKER IMPLANT    . REPLACEMENT TOTAL KNEE RIGHT  2016  . TONSILLECTOMY  1945    Prior to Admission medications   Medication Sig Start Date End Date Taking? Authorizing Provider  Acetaminophen 500 MG capsule Take 500 mg by mouth 3 times/day as needed-between meals & bedtime. 07/03/19   [provider]  amiodarone (PACERONE) 200 MG tablet amiodarone 200 mg tablet 08/07/19   [provider]  CALCIUM CITRATE PO Take 600 mg by mouth 2 (two) times daily as needed.    [provider]  carvedilol (COREG) 12.5 MG tablet carvedilol 12.5 mg tablet    [provider]  CITRACAL MAXIMUM 315-250 MG-UNIT TABS  07/03/19   [provider]  Coenzyme Q10 (COQ-10) 100 MG CAPS CoQ-10  1 daily    [provider]  fluticasone (FLONASE) 50 MCG/ACT nasal spray SMARTSIG:1-2 Spray(s) Both Nares Daily 09/15/19   [provider]  gabapentin (NEURONTIN) 100 MG capsule gabapentin 100 mg capsule  take 1 cap 3 times daily for chronic pain    [provider]  levothyroxine (SYNTHROID) 112 MCG tablet levothyroxine 112 mcg tablet  Take 1 each morning > 30 minutes before first meal 06/29/19   [provider]  losartan (COZAAR) 25 MG tablet  losartan 25 mg tablet  Take 1 tablet every day by oral route as directed. 09/19/19   [provider]  Magnesium Oxide 200 MG TABS magnesium 200 mg (as magnesium oxide) tablet  Take every day by oral route.    [provider]  Multiple Vitamin (MULTI-VITAMIN DAILY PO) Multi Vitamin  once a day with food    [provider]  Multiple Vitamins-Minerals (MULTIPLE VIT/MINERALS/NO IRON) TABS  07/03/19   [provider]  Rivaroxaban (XARELTO) 15 MG TABS tablet Xarelto 15 mg tablet 10/03/19   [provider]  spironolactone (ALDACTONE) 25 MG tablet 12.5 mg. 09/19/19   [provider]    Allergies Erythromycin base, Gluten meal, and Dofetilide  Family History  Problem Relation Age of Onset  . Pneumonia Mother   . Osteoporosis Mother   . Stroke Father   . Heart failure Father   . Hypertension Father   . Obesity Father   . Heart failure Sister     Social History Social History   Tobacco Use  . Smoking status: Never Smoker  . Smokeless tobacco: Never Used  Vaping Use  . Vaping Use: Never used  Substance Use Topics  . Alcohol use: Not Currently  . Drug use: Never    Review of Systems  Constitutional: No fever/chills Eyes: No visual changes. ENT: No sore throat. Cardiovascular: Positive for rib pain. Respiratory: Positive for shortness of breath. Gastrointestinal: No abdominal pain.  No nausea, no vomiting.  No diarrhea.  No constipation. Genitourinary: Negative for dysuria. Musculoskeletal: Negative for back pain. Skin: Negative for rash. Neurological: Negative for headaches, focal weakness or numbness.   ____________________________________________   PHYSICAL EXAM:  VITAL SIGNS: ED Triage Vitals  Enc Vitals Group     BP 01/15/20 1550 (!) 135/93     Pulse Rate 01/15/20 1550 80     Resp 01/15/20 1550 19     Temp 01/15/20 1550 98 F (36.7 C)     Temp Source 01/15/20 1550 Oral     SpO2 01/15/20 1550 92 %     Weight 01/15/20 1551 137 lb (62.1 kg)     Height 01/15/20 1551 5\' 4"  (1.626 m)     Head Circumference --      Peak Flow --      Pain Score 01/15/20 1551 10     Pain Loc --      Pain Edu? --      Excl. in GC? --     Constitutional: Alert and oriented.  Elderly appearing and in mild acute distress. Eyes: Conjunctivae are normal. PERRL. EOMI. Head: Atraumatic. Nose: Atraumatic. Mouth/Throat: Mucous membranes are moist.   Neck: No stridor.  No cervical spine tenderness to palpation. Cardiovascular: Normal rate, regular rhythm. Grossly normal heart sounds.  Good peripheral circulation. Respiratory: Increased  respiratory effort.  No retractions. Lungs slightly diminished bibasilarly.  Mild splinting.  No crepitus.  No external evidence of bruising. Gastrointestinal: Soft and nontender to light or deep palpation. No distention. No abdominal bruits. No CVA tenderness. Musculoskeletal: Kyphosis.  No spinal tenderness to palpation.  No lower extremity tenderness nor edema.  No joint effusions. Neurologic:  Normal speech and language. No gross focal neurologic deficits are appreciated.  Skin:  Skin is warm, dry and intact. No rash noted. Psychiatric: Mood and affect are normal. Speech and behavior are normal.  ____________________________________________   LABS (all labs ordered are listed, but only abnormal results are displayed)  Labs Reviewed  CBC WITH DIFFERENTIAL/PLATELET -  Abnormal; Notable for the following components:      Result Value   RBC 3.55 (*)    Hemoglobin 11.8 (*)    MCV 105.1 (*)    Platelets 139 (*)    All other components within normal limits  COMPREHENSIVE METABOLIC PANEL - Abnormal; Notable for the following components:   Glucose, Bld 134 (*)    BUN 35 (*)    Creatinine, Ser 1.66 (*)    Total Bilirubin 1.3 (*)    GFR calc non Af Amer 28 (*)    GFR calc Af Amer 32 (*)    All other components within normal limits  TROPONIN I (HIGH SENSITIVITY) - Abnormal; Notable for the following components:   Troponin I (High Sensitivity) 25 (*)    All other components within normal limits  SARS CORONAVIRUS 2 BY RT PCR (HOSPITAL ORDER, PERFORMED IN [facility])  TROPONIN I (HIGH SENSITIVITY)   ____________________________________________  EKG   ED ECG REPORT I, Josie Mesa J, the attending physician, personally viewed and interpreted this ECG.   Date: 01/16/2020  EKG Time: 0224  Rate: 81  Rhythm: normal EKG, normal sinus rhythm  Axis: Normal  Intervals:none  ST&T Change: Nonspecific  ____________________________________________  RADIOLOGY  ED MD  interpretation: No ICH, no visualized rib fracture or pneumothorax; CT chest demonstrates no pneumothorax, bibasilar atelectasis, progressive thoracic compression fractures  Official radiology report(s): DG Ribs Unilateral W/Chest Right  Result Date: 01/15/2020 CLINICAL DATA:  Post fall with right rib pain. EXAM: RIGHT RIBS AND CHEST - 3+ VIEW COMPARISON:  None. FINDINGS: No fracture or other bone lesions are seen involving the ribs. There is no evidence of pneumothorax or pleural effusion. Multi lead left-sided pacemaker in place. Mild cardiomegaly. Normal mediastinal contours. The patient's chin obscures the apices on the frontal view. Bones are diffusely under mineralized. Thoracic spine compression fractures which were better assessed on imaging in May. IMPRESSION: 1. No visualized right rib fracture or acute intrathoracic abnormality. 2. Mild cardiomegaly. 3. Osteopenia/osteoporosis. Electronically Signed   By: Narda Rutherford M.D.   On: 01/15/2020 16:53   CT Head Wo Contrast  Result Date: 01/15/2020 CLINICAL DATA:  Head trauma, minor.  Fall, hitting head, on Xarelto. EXAM: CT HEAD WITHOUT CONTRAST TECHNIQUE: Contiguous axial images were obtained from the base of the skull through the vertex without intravenous contrast. COMPARISON:  No pertinent prior exams are available for comparison. FINDINGS: Brain: Mild generalized parenchymal atrophy. There is no acute intracranial hemorrhage. No demarcated cortical infarct. No extra-axial fluid collection. No evidence of intracranial mass. No midline shift. Vascular: No hyperdense vessel.  Atherosclerotic calcifications. Skull: Normal. Negative for fracture or focal lesion. Sinuses/Orbits: Visualized orbits show no acute finding. Mild ethmoid sinus mucosal thickening. No significant mastoid effusion at the imaged levels IMPRESSION: No evidence of acute intracranial abnormality. Mild generalized parenchymal atrophy. Mild ethmoid sinus mucosal thickening.  Electronically Signed   By: Jackey Loge DO   On: 01/15/2020 16:28   CT Chest Wo Contrast  Result Date: 01/16/2020 CLINICAL DATA:  Recent fall with right-sided chest pain EXAM: CT CHEST WITHOUT CONTRAST TECHNIQUE: Multidetector CT imaging of the chest was performed following the standard protocol without IV contrast. COMPARISON:  Rib series from the previous day, CT from 11/07/2019. FINDINGS: Cardiovascular: Somewhat limited due to lack of IV contrast. Aortic calcifications are noted. No aneurysmal dilatation is seen. Mild coronary calcifications are noted. Pacing device is seen. No pericardial effusion is noted. Mediastinum/Nodes: Thoracic inlet is within normal limits. No hilar  or mediastinal adenopathy is noted. The esophagus as visualized is within normal limits. Lungs/Pleura: Mild atelectatic changes are noted in the bases bilaterally. No focal confluent infiltrate is seen. No effusion or pneumothorax is noted. Upper Abdomen: Visualized upper abdomen shows no acute abnormality. Musculoskeletal: Degenerative changes of the thoracic spine are noted. Compression deformities are noted involving T3, T4, T5, T7, T8 and T9. These have progressed somewhat in the interval particularly at the T3, T4 and T5 levels. No rib abnormality is noted. IMPRESSION: Mild bibasilar atelectasis.  No pneumothorax is noted. No evidence of rib fractures are seen. Progressive thoracic compression deformities particularly in the upper thoracic spine as described. Aortic Atherosclerosis (ICD10-I70.0). Electronically Signed   By: Alcide Clever M.D.   On: 01/16/2020 02:09    ____________________________________________   PROCEDURES  Procedure(s) performed (including Critical Care):  Procedures   ____________________________________________   INITIAL IMPRESSION / ASSESSMENT AND PLAN / ED COURSE  As part of my medical decision making, I reviewed the following data within the electronic MEDICAL RECORD NUMBER History obtained from  family, Nursing notes reviewed and incorporated, Old chart reviewed, Radiograph reviewed and Notes from prior ED visits     LADAIJA DIMINO was evaluated in Emergency Department on 01/16/2020 for the symptoms described in the history of present illness. She was evaluated in the context of the global COVID-19 pandemic, which necessitated consideration that the patient might be at risk for infection with the SARS-CoV-2 virus that causes COVID-19. Institutional protocols and algorithms that pertain to the evaluation of patients at risk for COVID-19 are in a state of rapid change based on information released by regulatory bodies including the CDC and federal and state organizations. These policies and algorithms were followed during the patient's care in the ED.    86 year old female who presents status post mechanical fall with rib pain and shortness of breath. Differential includes, but is not limited to, viral syndrome, bronchitis including COPD exacerbation, pneumonia, reactive airway disease including asthma, CHF including exacerbation with or without pulmonary/interstitial edema, pneumothorax, ACS, thoracic trauma, and pulmonary embolism.  CT negative for intracranial hemorrhage; rib x-rays negative for acute fracture or pneumothorax.  Will administer Norco and reassess.   Clinical Course as of Jan 15 254  Tue Jan 16, 2020  0108 Patient remains uncomfortable after Norco.  Room air saturations dipped as low as 83%.  Placed on 2 L nasal cannula oxygen with sats 97%.  Will obtain basic lab work, CT chest, administer IV morphine.  Anticipate hospitalization.   [JS]    Clinical Course User Index [JS] Irean Hong, MD     ____________________________________________   FINAL CLINICAL IMPRESSION(S) / ED DIAGNOSES  Final diagnoses:  Fall, initial encounter  Rib pain on right side  Hypoxia  Atelectasis     ED Discharge Orders    None       Note:  This document was prepared using  Dragon voice recognition software and may include unintentional dictation errors.   Irean Hong, MD 01/16/20 [phone]

## 2020-01-15 NOTE — ED Triage Notes (Signed)
Pt in via POV, reports mechanical fall this morning in kitchen, complaints of right rib pain.  Does reports hitting head, denies LOC.  Pt takes xarelto.  NAD noted at this time.

## 2020-01-15 NOTE — ED Notes (Addendum)
Pt reports she fell this am after opening the dishwasher.  Pt has back pain.  States it hurts all over.  No loc.  No vomiting or h/a.   Pt denies chest pain or sob.  Pt's oxygen sats low on room air.  Pt placed on 2 liters [place].  Hx copd.   Pt alert  Speech clear.

## 2020-01-16 ENCOUNTER — Emergency Department: Payer: Medicare Other

## 2020-01-16 DIAGNOSIS — S0990XA Unspecified injury of head, initial encounter: Secondary | ICD-10-CM | POA: Diagnosis present

## 2020-01-16 DIAGNOSIS — Z8262 Family history of osteoporosis: Secondary | ICD-10-CM | POA: Diagnosis not present

## 2020-01-16 DIAGNOSIS — Y92010 Kitchen of single-family (private) house as the place of occurrence of the external cause: Secondary | ICD-10-CM | POA: Diagnosis not present

## 2020-01-16 DIAGNOSIS — M40209 Unspecified kyphosis, site unspecified: Secondary | ICD-10-CM | POA: Diagnosis present

## 2020-01-16 DIAGNOSIS — Z20822 Contact with and (suspected) exposure to covid-19: Secondary | ICD-10-CM | POA: Diagnosis present

## 2020-01-16 DIAGNOSIS — I130 Hypertensive heart and chronic kidney disease with heart failure and stage 1 through stage 4 chronic kidney disease, or unspecified chronic kidney disease: Secondary | ICD-10-CM | POA: Diagnosis present

## 2020-01-16 DIAGNOSIS — Z7901 Long term (current) use of anticoagulants: Secondary | ICD-10-CM | POA: Diagnosis not present

## 2020-01-16 DIAGNOSIS — E039 Hypothyroidism, unspecified: Secondary | ICD-10-CM | POA: Diagnosis present

## 2020-01-16 DIAGNOSIS — M4854XA Collapsed vertebra, not elsewhere classified, thoracic region, initial encounter for fracture: Secondary | ICD-10-CM | POA: Diagnosis present

## 2020-01-16 DIAGNOSIS — I5032 Chronic diastolic (congestive) heart failure: Secondary | ICD-10-CM

## 2020-01-16 DIAGNOSIS — I10 Essential (primary) hypertension: Secondary | ICD-10-CM | POA: Diagnosis not present

## 2020-01-16 DIAGNOSIS — J9811 Atelectasis: Secondary | ICD-10-CM | POA: Diagnosis present

## 2020-01-16 DIAGNOSIS — N1831 Chronic kidney disease, stage 3a: Secondary | ICD-10-CM | POA: Diagnosis present

## 2020-01-16 DIAGNOSIS — Z79890 Hormone replacement therapy (postmenopausal): Secondary | ICD-10-CM | POA: Diagnosis not present

## 2020-01-16 DIAGNOSIS — Y92009 Unspecified place in unspecified non-institutional (private) residence as the place of occurrence of the external cause: Secondary | ICD-10-CM | POA: Diagnosis not present

## 2020-01-16 DIAGNOSIS — D6859 Other primary thrombophilia: Secondary | ICD-10-CM | POA: Diagnosis present

## 2020-01-16 DIAGNOSIS — J9601 Acute respiratory failure with hypoxia: Secondary | ICD-10-CM

## 2020-01-16 DIAGNOSIS — Z888 Allergy status to other drugs, medicaments and biological substances status: Secondary | ICD-10-CM | POA: Diagnosis not present

## 2020-01-16 DIAGNOSIS — R0902 Hypoxemia: Secondary | ICD-10-CM | POA: Diagnosis present

## 2020-01-16 DIAGNOSIS — Z881 Allergy status to other antibiotic agents status: Secondary | ICD-10-CM | POA: Diagnosis not present

## 2020-01-16 DIAGNOSIS — Z950 Presence of cardiac pacemaker: Secondary | ICD-10-CM | POA: Diagnosis not present

## 2020-01-16 DIAGNOSIS — I480 Paroxysmal atrial fibrillation: Secondary | ICD-10-CM | POA: Diagnosis present

## 2020-01-16 DIAGNOSIS — J449 Chronic obstructive pulmonary disease, unspecified: Secondary | ICD-10-CM | POA: Diagnosis present

## 2020-01-16 DIAGNOSIS — W19XXXA Unspecified fall, initial encounter: Secondary | ICD-10-CM | POA: Diagnosis present

## 2020-01-16 DIAGNOSIS — Z79899 Other long term (current) drug therapy: Secondary | ICD-10-CM | POA: Diagnosis not present

## 2020-01-16 DIAGNOSIS — Z8249 Family history of ischemic heart disease and other diseases of the circulatory system: Secondary | ICD-10-CM | POA: Diagnosis not present

## 2020-01-16 DIAGNOSIS — M810 Age-related osteoporosis without current pathological fracture: Secondary | ICD-10-CM | POA: Diagnosis present

## 2020-01-16 DIAGNOSIS — Z823 Family history of stroke: Secondary | ICD-10-CM | POA: Diagnosis not present

## 2020-01-16 DIAGNOSIS — Z9109 Other allergy status, other than to drugs and biological substances: Secondary | ICD-10-CM | POA: Diagnosis not present

## 2020-01-16 LAB — COMPREHENSIVE METABOLIC PANEL
ALT: 24 U/L (ref 0–44)
AST: 35 U/L (ref 15–41)
Albumin: 4 g/dL (ref 3.5–5.0)
Alkaline Phosphatase: 51 U/L (ref 38–126)
Anion gap: 11 (ref 5–15)
BUN: 35 mg/dL — ABNORMAL HIGH (ref 8–23)
CO2: 28 mmol/L (ref 22–32)
Calcium: 9 mg/dL (ref 8.9–10.3)
Chloride: 98 mmol/L (ref 98–111)
Creatinine, Ser: 1.66 mg/dL — ABNORMAL HIGH (ref 0.44–1.00)
GFR calc Af Amer: 32 mL/min — ABNORMAL LOW (ref >60)
GFR calc non Af Amer: 28 mL/min — ABNORMAL LOW (ref >60)
Glucose, Bld: 134 mg/dL — ABNORMAL HIGH (ref 70–99)
Potassium: 4.7 mmol/L (ref 3.5–5.1)
Sodium: 137 mmol/L (ref 135–145)
Total Bilirubin: 1.3 mg/dL — ABNORMAL HIGH (ref 0.3–1.2)
Total Protein: 6.6 g/dL (ref 6.5–8.1)

## 2020-01-16 LAB — CBC WITH DIFFERENTIAL/PLATELET
Abs Immature Granulocytes: 0.03 10*3/uL (ref 0.00–0.07)
Basophils Absolute: 0 10*3/uL (ref 0.0–0.1)
Basophils Relative: 0 %
Eosinophils Absolute: 0 10*3/uL (ref 0.0–0.5)
Eosinophils Relative: 0 %
HCT: 37.3 % (ref 36.0–46.0)
Hemoglobin: 11.8 g/dL — ABNORMAL LOW (ref 12.0–15.0)
Immature Granulocytes: 0 %
Lymphocytes Relative: 14 %
Lymphs Abs: 1.2 10*3/uL (ref 0.7–4.0)
MCH: 33.2 pg (ref 26.0–34.0)
MCHC: 31.6 g/dL (ref 30.0–36.0)
MCV: 105.1 fL — ABNORMAL HIGH (ref 80.0–100.0)
Monocytes Absolute: 0.5 10*3/uL (ref 0.1–1.0)
Monocytes Relative: 6 %
Neutro Abs: 6.9 10*3/uL (ref 1.7–7.7)
Neutrophils Relative %: 80 %
Platelets: 139 10*3/uL — ABNORMAL LOW (ref 150–400)
RBC: 3.55 MIL/uL — ABNORMAL LOW (ref 3.87–5.11)
RDW: 13.6 % (ref 11.5–15.5)
WBC: 8.7 10*3/uL (ref 4.0–10.5)
nRBC: 0 % (ref 0.0–0.2)

## 2020-01-16 LAB — TROPONIN I (HIGH SENSITIVITY)
Troponin I (High Sensitivity): 25 ng/L — ABNORMAL HIGH (ref <18)
Troponin I (High Sensitivity): 15 ng/L (ref <18)

## 2020-01-16 LAB — SARS CORONAVIRUS 2 BY RT PCR (HOSPITAL ORDER, PERFORMED IN ~~LOC~~ HOSPITAL LAB): SARS Coronavirus 2: NEGATIVE

## 2020-01-16 MED ORDER — LOSARTAN POTASSIUM 50 MG PO TABS
25.0000 mg | ORAL_TABLET | Freq: Every day | ORAL | Status: DC
  Administered 2020-01-16 – 2020-01-17 (×2): 25 mg via ORAL
  Filled 2020-01-16 (×2): qty 1

## 2020-01-16 MED ORDER — SODIUM CHLORIDE 0.9 % IV SOLN: INTRAVENOUS | Status: DC

## 2020-01-16 MED ORDER — ENOXAPARIN SODIUM 40 MG/0.4ML ~~LOC~~ SOLN: 40.0000 mg | SUBCUTANEOUS | Status: DC

## 2020-01-16 MED ORDER — MORPHINE SULFATE (PF) 2 MG/ML IV SOLN
2.0000 mg | Freq: Once | INTRAVENOUS | Status: AC
  Administered 2020-01-16: 2 mg via INTRAVENOUS
  Filled 2020-01-16: qty 1

## 2020-01-16 MED ORDER — SODIUM CHLORIDE 0.9 % IV BOLUS
1000.0000 mL | Freq: Once | INTRAVENOUS | Status: AC
  Administered 2020-01-16: 1000 mL via INTRAVENOUS

## 2020-01-16 MED ORDER — LEVOTHYROXINE SODIUM 112 MCG PO TABS
112.0000 ug | ORAL_TABLET | Freq: Every day | ORAL | Status: DC
  Administered 2020-01-17: 112 ug via ORAL
  Filled 2020-01-16: qty 1

## 2020-01-16 MED ORDER — RIVAROXABAN 15 MG PO TABS
15.0000 mg | ORAL_TABLET | Freq: Every day | ORAL | Status: DC
  Administered 2020-01-16: 15 mg via ORAL
  Filled 2020-01-16: qty 1

## 2020-01-16 MED ORDER — COQ-10 100 MG PO CAPS: 100.0000 mg | ORAL_CAPSULE | Freq: Every day | ORAL | Status: DC

## 2020-01-16 MED ORDER — FLUTICASONE PROPIONATE 50 MCG/ACT NA SUSP: 1.0000 | Freq: Every day | NASAL | Status: DC

## 2020-01-16 MED ORDER — BUDESONIDE 0.5 MG/2ML IN SUSP
2.0000 mL | Freq: Two times a day (BID) | RESPIRATORY_TRACT | Status: DC
  Administered 2020-01-16 – 2020-01-17 (×2): 0.5 mg via RESPIRATORY_TRACT
  Filled 2020-01-16 (×2): qty 2

## 2020-01-16 MED ORDER — FLUTICASONE PROPIONATE 50 MCG/ACT NA SUSP
1.0000 | Freq: Every day | NASAL | Status: DC
  Filled 2020-01-16: qty 16

## 2020-01-16 MED ORDER — RISAQUAD PO CAPS
1.0000 | ORAL_CAPSULE | Freq: Every day | ORAL | Status: DC
  Administered 2020-01-16 – 2020-01-17 (×2): 1 via ORAL
  Filled 2020-01-16 (×2): qty 1

## 2020-01-16 MED ORDER — ACETAMINOPHEN 650 MG RE SUPP: 650.0000 mg | Freq: Four times a day (QID) | RECTAL | Status: DC | PRN

## 2020-01-16 MED ORDER — ONDANSETRON HCL 4 MG/2ML IJ SOLN: 4.0000 mg | Freq: Four times a day (QID) | INTRAMUSCULAR | Status: DC | PRN

## 2020-01-16 MED ORDER — CARVEDILOL 6.25 MG PO TABS
12.5000 mg | ORAL_TABLET | Freq: Two times a day (BID) | ORAL | Status: DC
  Administered 2020-01-16 – 2020-01-17 (×2): 12.5 mg via ORAL
  Filled 2020-01-16 (×2): qty 2

## 2020-01-16 MED ORDER — SIMVASTATIN 10 MG PO TABS
10.0000 mg | ORAL_TABLET | Freq: Every day | ORAL | Status: DC
  Administered 2020-01-16: 10 mg via ORAL
  Filled 2020-01-16: qty 1

## 2020-01-16 MED ORDER — GABAPENTIN 300 MG PO CAPS
300.0000 mg | ORAL_CAPSULE | Freq: Three times a day (TID) | ORAL | Status: DC
  Administered 2020-01-16 – 2020-01-17 (×3): 300 mg via ORAL
  Filled 2020-01-16 (×3): qty 1

## 2020-01-16 MED ORDER — MAGNESIUM OXIDE 400 (241.3 MG) MG PO TABS
200.0000 mg | ORAL_TABLET | Freq: Every day | ORAL | Status: DC
  Administered 2020-01-16 – 2020-01-17 (×2): 200 mg via ORAL
  Filled 2020-01-16 (×2): qty 1

## 2020-01-16 MED ORDER — PANTOPRAZOLE SODIUM 40 MG PO TBEC
40.0000 mg | DELAYED_RELEASE_TABLET | Freq: Every day | ORAL | Status: DC
  Administered 2020-01-16 – 2020-01-17 (×2): 40 mg via ORAL
  Filled 2020-01-16 (×2): qty 1

## 2020-01-16 MED ORDER — HYDROCODONE-ACETAMINOPHEN 5-325 MG PO TABS: 1.0000 | ORAL_TABLET | ORAL | Status: DC | PRN

## 2020-01-16 MED ORDER — AMIODARONE HCL 200 MG PO TABS
200.0000 mg | ORAL_TABLET | Freq: Every day | ORAL | Status: DC
  Administered 2020-01-16 – 2020-01-17 (×2): 200 mg via ORAL
  Filled 2020-01-16 (×2): qty 1

## 2020-01-16 MED ORDER — ONDANSETRON HCL 4 MG PO TABS: 4.0000 mg | ORAL_TABLET | Freq: Four times a day (QID) | ORAL | Status: DC | PRN

## 2020-01-16 MED ORDER — ACETAMINOPHEN 325 MG PO TABS
650.0000 mg | ORAL_TABLET | Freq: Four times a day (QID) | ORAL | Status: DC | PRN
  Administered 2020-01-16: 650 mg via ORAL
  Filled 2020-01-16: qty 2

## 2020-01-16 NOTE — H&P (Signed)
History and Physical    Mackenzie Walsh YCX:[id number] DOB: [birth year] DOA: 01/15/2020  PCP: Mick Sell, MD   Patient coming from: home  I have personally briefly reviewed patient's old medical records in [facility]  Chief Complaint: Fall  HPI: Mackenzie Walsh is a 86 y.o. female with medical history significant for CKD 3, A. fib on Xarelto, HTN, COPD, AICD, history of multiple thoracic wedge compression fractures, hypothyroidism who presents to the emergency room, following an accidental fall when she fell backwards while opening her dishwasher hitting her right chest and head.  Had no LOC she complains of right rib pain when taking a deep breath.  No headache or blurred vision.   O2 sats reportedly as low as 83% in triage.  Daughter at bedside ED Course: On initial assessment O2 sat 92% on room air with otherwise normal vitals blood work at baseline including hemoglobin and renal function.  Head CT with no acute findings CT chest showed no rib fractures but did show progression of her known thoracic compression fractures.  Patient received hydrocodone in the emergency room with improvement in her chest wall pain.  Hospitalization requested due to persistent hypoxia.  Review of Systems: As per HPI otherwise all other systems on review of systems negative.    Past Medical History:  Diagnosis Date  . A-fib (HCC)   . Allergy   . Anemia   . Arrhythmia   . Atrial fibrillation (HCC)   . Broken femur (HCC) 2017  . Cataract   . Celiac disease   . Chronic pain syndrome   . Chronic systolic CHF (congestive heart failure) (HCC)   . CKD (chronic kidney disease)   . COPD (chronic obstructive pulmonary disease) (HCC)   . Dilated cardiomyopathy (HCC)   . Hyperlipidemia   . Hypertension   . Migraines   . Mitral valve regurgitation   . Osteoporosis   . Posterior rhinorrhea   . Scoliosis deformity of spine   . Senile hyperkeratosis   . Thyroid disease    hypothyroid  .  Ventricular tachyarrhythmia [facility])     Past Surgical History:  Procedure Laterality Date  . ablation arrythmia focus    . APPENDECTOMY  1945  . CARDIAC DEFIBRILLATOR PLACEMENT  2016  . CARDIAC SURGERY    . CARDIOVERSION EXTERNAL X4    . PACEMAKER IMPLANT    . REPLACEMENT TOTAL KNEE RIGHT  2016  . TONSILLECTOMY  1945     reports that she has never smoked. She has never used smokeless tobacco. She reports previous alcohol use. She reports that she does not use drugs.  Allergies  Allergen Reactions  . Erythromycin Base Hives  . Gluten Meal Other (See Comments)    Celiac Disease  . Dofetilide Rash    Family History  Problem Relation Age of Onset  . Pneumonia Mother   . Osteoporosis Mother   . Stroke Father   . Heart failure Father   . Hypertension Father   . Obesity Father   . Heart failure Sister       Prior to Admission medications   Medication Sig Start Date End Date Taking? Authorizing Provider  Acetaminophen 500 MG capsule Take 500 mg by mouth 3 times/day as needed-between meals & bedtime. 07/03/19   [provider]  amiodarone (PACERONE) 200 MG tablet amiodarone 200 mg tablet 08/07/19   [provider]  CALCIUM CITRATE PO Take 600 mg by mouth 2 (two) times daily as needed.  [provider]  carvedilol (COREG) 12.5 MG tablet carvedilol 12.5 mg tablet    [provider]  CITRACAL MAXIMUM 315-250 MG-UNIT TABS  07/03/19   [provider]  Coenzyme Q10 (COQ-10) 100 MG CAPS CoQ-10  1 daily    [provider]  fluticasone (FLONASE) 50 MCG/ACT nasal spray SMARTSIG:1-2 Spray(s) Both Nares Daily 09/15/19   [provider]  gabapentin (NEURONTIN) 100 MG capsule gabapentin 100 mg capsule  take 1 cap 3 times daily for chronic pain    [provider]  levothyroxine (SYNTHROID) 112 MCG tablet levothyroxine 112 mcg tablet  Take 1 each morning > 30 minutes before first meal 06/29/19   [provider]    losartan (COZAAR) 25 MG tablet losartan 25 mg tablet  Take 1 tablet every day by oral route as directed. 09/19/19   [provider]  Magnesium Oxide 200 MG TABS magnesium 200 mg (as magnesium oxide) tablet  Take every day by oral route.    [provider]  Multiple Vitamin (MULTI-VITAMIN DAILY PO) Multi Vitamin  once a day with food    [provider]  Multiple Vitamins-Minerals (MULTIPLE VIT/MINERALS/NO IRON) TABS  07/03/19   [provider]  Rivaroxaban (XARELTO) 15 MG TABS tablet Xarelto 15 mg tablet 10/03/19   [provider]  spironolactone (ALDACTONE) 25 MG tablet 12.5 mg. 09/19/19   [provider]    Physical Exam: Vitals:   01/15/20 2338 01/15/20 2339 01/15/20 2340 01/15/20 2341  BP:   (!) 145/83   Pulse: 81 80 86 83  Resp: (!) 25 22 20 22   Temp:      TempSrc:      SpO2: 98% 100% 97% 99%  Weight:      Height:         Vitals:   01/15/20 2338 01/15/20 2339 01/15/20 2340 01/15/20 2341  BP:   (!) 145/83   Pulse: 81 80 86 83  Resp: (!) 25 22 20 22   Temp:      TempSrc:      SpO2: 98% 100% 97% 99%  Weight:      Height:          Constitutional:  Drowsy, and some pain discomfort. Not in any apparent distress HEENT:      Head: Normocephalic and atraumatic.         Eyes: PERLA, EOMI, Conjunctivae are normal. Sclera is non-icteric.       Mouth/Throat: Mucous membranes are moist.       Neck: Supple with no signs of meningismus. Cardiovascular: Regular rate and rhythm. No murmurs, gallops, or rubs. 2+ symmetrical distal pulses are present . No JVD. No LE edema Respiratory: Respiratory effort normal .Lungs sounds clear bilaterally. No wheezes, crackles, or rhonchi.  Gastrointestinal: Soft, non tender, and non distended with positive bowel sounds. No rebound or guarding. Genitourinary: No CVA tenderness. Musculoskeletal: Nontender with normal range of motion in all extremities. No cyanosis, or erythema of  extremities. Neurologic: Normal speech and language. Face is symmetric. Moving all extremities. No gross focal neurologic deficits . Skin: Skin is warm, dry.  No rash or ulcers Psychiatric: Mood and affect are normal Speech and behavior are normal   Labs on Admission: I have personally reviewed following labs and imaging studies  CBC: Recent Labs  Lab 01/16/20 0114  WBC 8.7  NEUTROABS 6.9  HGB 11.8*  HCT 37.3  MCV 105.1*  PLT 139*   Basic Metabolic Panel: Recent Labs  Lab 01/16/20 0114  NA 137  K 4.7  CL 98  CO2 28  GLUCOSE 134*  BUN 35*  CREATININE 1.66*  CALCIUM 9.0   GFR: Estimated Creatinine Clearance: 21.4 mL/min (A) (by C-G formula based on SCr of 1.66 mg/dL (H)). Liver Function Tests: Recent Labs  Lab 01/16/20 0114  AST 35  ALT 24  ALKPHOS 51  BILITOT 1.3*  PROT 6.6  ALBUMIN 4.0   No results for input(s): LIPASE, AMYLASE in the last 168 hours. No results for input(s): AMMONIA in the last 168 hours. Coagulation Profile: No results for input(s): INR, PROTIME in the last 168 hours. Cardiac Enzymes: No results for input(s): CKTOTAL, CKMB, CKMBINDEX, TROPONINI in the last 168 hours. BNP (last 3 results) No results for input(s): PROBNP in the last 8760 hours. HbA1C: No results for input(s): HGBA1C in the last 72 hours. CBG: No results for input(s): GLUCAP in the last 168 hours. Lipid Profile: No results for input(s): CHOL, HDL, LDLCALC, TRIG, CHOLHDL, LDLDIRECT in the last 72 hours. Thyroid Function Tests: No results for input(s): TSH, T4TOTAL, FREET4, T3FREE, THYROIDAB in the last 72 hours. Anemia Panel: No results for input(s): VITAMINB12, FOLATE, FERRITIN, TIBC, IRON, RETICCTPCT in the last 72 hours. Urine analysis: No results found for: COLORURINE, APPEARANCEUR, LABSPEC, PHURINE, GLUCOSEU, HGBUR, BILIRUBINUR, KETONESUR, PROTEINUR, UROBILINOGEN, NITRITE, LEUKOCYTESUR  Radiological Exams on Admission: DG Ribs Unilateral W/Chest Right  Result  Date: 01/15/2020 CLINICAL DATA:  Post fall with right rib pain. EXAM: RIGHT RIBS AND CHEST - 3+ VIEW COMPARISON:  None. FINDINGS: No fracture or other bone lesions are seen involving the ribs. There is no evidence of pneumothorax or pleural effusion. Multi lead left-sided pacemaker in place. Mild cardiomegaly. Normal mediastinal contours. The patient's chin obscures the apices on the frontal view. Bones are diffusely under mineralized. Thoracic spine compression fractures which were better assessed on imaging in May. IMPRESSION: 1. No visualized right rib fracture or acute intrathoracic abnormality. 2. Mild cardiomegaly. 3. Osteopenia/osteoporosis. Electronically Signed   By: Narda Rutherford M.D.   On: 01/15/2020 16:53   CT Head Wo Contrast  Result Date: 01/15/2020 CLINICAL DATA:  Head trauma, minor.  Fall, hitting head, on Xarelto. EXAM: CT HEAD WITHOUT CONTRAST TECHNIQUE: Contiguous axial images were obtained from the base of the skull through the vertex without intravenous contrast. COMPARISON:  No pertinent prior exams are available for comparison. FINDINGS: Brain: Mild generalized parenchymal atrophy. There is no acute intracranial hemorrhage. No demarcated cortical infarct. No extra-axial fluid collection. No evidence of intracranial mass. No midline shift. Vascular: No hyperdense vessel.  Atherosclerotic calcifications. Skull: Normal. Negative for fracture or focal lesion. Sinuses/Orbits: Visualized orbits show no acute finding. Mild ethmoid sinus mucosal thickening. No significant mastoid effusion at the imaged levels IMPRESSION: No evidence of acute intracranial abnormality. Mild generalized parenchymal atrophy. Mild ethmoid sinus mucosal thickening. Electronically Signed   By: Jackey Loge DO   On: 01/15/2020 16:28   CT Chest Wo Contrast  Result Date: 01/16/2020 CLINICAL DATA:  Recent fall with right-sided chest pain EXAM: CT CHEST WITHOUT CONTRAST TECHNIQUE: Multidetector CT imaging of the chest  was performed following the standard protocol without IV contrast. COMPARISON:  Rib series from the previous day, CT from 11/07/2019. FINDINGS: Cardiovascular: Somewhat limited due to lack of IV contrast. Aortic calcifications are noted. No aneurysmal dilatation is seen. Mild coronary calcifications are noted. Pacing device is seen. No pericardial effusion is noted. Mediastinum/Nodes: Thoracic inlet is within normal limits. No hilar or mediastinal adenopathy is noted. The esophagus as visualized  is within normal limits. Lungs/Pleura: Mild atelectatic changes are noted in the bases bilaterally. No focal confluent infiltrate is seen. No effusion or pneumothorax is noted. Upper Abdomen: Visualized upper abdomen shows no acute abnormality. Musculoskeletal: Degenerative changes of the thoracic spine are noted. Compression deformities are noted involving T3, T4, T5, T7, T8 and T9. These have progressed somewhat in the interval particularly at the T3, T4 and T5 levels. No rib abnormality is noted. IMPRESSION: Mild bibasilar atelectasis.  No pneumothorax is noted. No evidence of rib fractures are seen. Progressive thoracic compression deformities particularly in the upper thoracic spine as described. Aortic Atherosclerosis (ICD10-I70.0). Electronically Signed   By: Alcide Clever M.D.   On: 01/16/2020 02:09    EKG: Independently reviewed. Interpretation : Paced rhythm  Assessment/Plan 86 year old female with history of CKD 3, A. fib on Xarelto, HTN, COPD, AICD, history of multiple thoracic wedge compression fractures, hypothyroidism admitted following an accidental fall with right rib cage pain and hypoxia    Fall at home, initial encounter -Patient fell backwards while opening her dishwasher hitting her head on right chest.  No LOC.  CT chest with no rib fracture but showing progression of thoracic wedge compression fractures -Consider physical therapy evaluation    Acute respiratory failure with hypoxia  (HCC) -O2 sat reportedly as low as 83% on room air on arrival -Possibly related to splinting from pain -Pain control, incentive spirometer    COPD with chronic bronchitis (HCC) -Continue home inhalers.  Duo nebs as needed    Chronic diastolic CHF (congestive heart failure) (HCC) -Not acutely exacerbated.  Continue home meds  Chronic A. fib on chronic anticoagulation -Continue Xarelto and rate control agents    Compression fracture of thoracic spine, non-traumatic (HCC) -CT chest showing progression.  Likely not related to present trauma -Pain control    DVT prophylaxis: Continue Xarelto Code Status: full code  Family Communication: Daughter at bedside Disposition Plan: Back to previous home environment Consults called: none  Status: Observation    Andris Baumann MD [facility]     01/16/2020, 3:38 AM

## 2020-01-16 NOTE — ED Notes (Signed)
Report off to sarah rn  

## 2020-01-16 NOTE — ED Notes (Signed)
NT assisted pt to the bathroom.

## 2020-01-16 NOTE — Progress Notes (Signed)
Patient moved to hospital bed.

## 2020-01-16 NOTE — Progress Notes (Signed)
PHARMACIST - PHYSICIAN ORDER COMMUNICATION  CONCERNING: P&T Medication Policy on Herbal Medications  DESCRIPTION:  This patient's order for:  Coenzyme Q10  has been noted.  This product(s) is classified as an "herbal" or natural product. Due to a lack of definitive safety studies or FDA approval, nonstandard manufacturing practices, plus the potential risk of unknown drug-drug interactions while on inpatient medications, the Pharmacy and Therapeutics Committee does not permit the use of "herbal" or natural products of this type within [facility].   ACTION TAKEN: The pharmacy department is unable to verify this order at this time. Please reevaluate patient's clinical condition at discharge and address if the herbal or natural product(s) should be resumed at that time.  Gardner Candle, PharmD, BCPS Clinical Pharmacist 01/16/2020 10:03 AM

## 2020-01-16 NOTE — ED Notes (Signed)
NT at bedside assisting pt as pt called out to use restroom.

## 2020-01-16 NOTE — ED Notes (Signed)
This RN to bedside to switch monitor in room to comfort care for pt as kept alarming v-tach when pt obviously not in v-tach. Pt within her baseline. Monitor still set to regular ED pt and able to alarm at nurses station.

## 2020-01-16 NOTE — ED Notes (Signed)
Pt repositioned

## 2020-01-16 NOTE — ED Notes (Signed)
[place] in-pt bed ordered for pt.

## 2020-01-16 NOTE — ED Notes (Signed)
Patient given breakfast tray. Sitting up in bed eating at this time. Daughter remains at bedside.

## 2020-01-16 NOTE — Plan of Care (Signed)
  Problem: Education: Goal: Knowledge of [facility] Education information/materials will improve Outcome: Progressing Goal: Emotional status will improve Outcome: Progressing Goal: Mental status will improve Outcome: Progressing Goal: Verbalization of understanding the information provided will improve Outcome: Progressing   

## 2020-01-16 NOTE — ED Notes (Signed)
Pt given her gluten free lunch tray and drink. Family remains at bedside.

## 2020-01-16 NOTE — ED Notes (Signed)
Patient assisted to ambulate to

## 2020-01-16 NOTE — Progress Notes (Signed)
Same day rounding progress note  Patient seen and examined while in the ED, waiting for the floor bed in the hospital.  Patient's daughter-in-law at bedside was updated about patient's condition and management plans.  86 year old female with a known history of CKD stage IIIb, A. fib on Xarelto, hypertension, COPD, AICD, history of multiple thoracic wedge compression fracture, hypothyroidism is admitted after an accidental fall.  Fall No loss of consciousness, CT chest with no new rib fractures Await PT/OT evaluation  Acute hypoxic respiratory failure Patient easily desaturating in low 80s at rest likely due to splinting from pain with underlying COPD -TOC consult for oxygen at home and possibly higher level of care need at discharge based on PT/OT evaluation  COPD with chronic bronchitis At baseline  Chronic diastolic CHF Well compensated at this time  Chronic A. fib on anticoagulation with Xarelto  Progressive compression fracture of thoracic spine Pain management as needed  Time spent: 25 minutes  Possible discharge tomorrow back to her facility with oxygen

## 2020-01-16 NOTE — Progress Notes (Signed)
Patient w/ h/o afib on xarelto 15 mg daily PTA is admitted s/t fall w/ chest pain.  Currently patient has no visualized rib or hip fractures on CT, CT head negative and CT chest showing bibasilar atelectasis w/ progressive thoracic compression spinal deformities. On EKG patient has PM w/ PVCs. CBCs stable.  Pharmacy is consulted to restart patient's xarelto. Will restart xarelto 15 mg daily w/ supper to start 07/27.   Thomasene Ripple, PharmD, BCPS Clinical Pharmacist 01/16/2020

## 2020-01-16 NOTE — ED Notes (Signed)
Pt up to the bedside commode 

## 2020-01-16 NOTE — ED Notes (Signed)
MD at bedside with patient and family. Per MD, patient's oxygen removed to check room air sat. During 5 minute period while resting and talking to MD, patient's O2 dropped to 82% on room air. Oxygen replaced and MD explained to patient need for another night of observation prior to discharge. Patient and family agreeable to plan.

## 2020-01-17 DIAGNOSIS — Y92009 Unspecified place in unspecified non-institutional (private) residence as the place of occurrence of the external cause: Secondary | ICD-10-CM | POA: Diagnosis not present

## 2020-01-17 DIAGNOSIS — I10 Essential (primary) hypertension: Secondary | ICD-10-CM | POA: Diagnosis not present

## 2020-01-17 DIAGNOSIS — W19XXXA Unspecified fall, initial encounter: Secondary | ICD-10-CM | POA: Diagnosis not present

## 2020-01-17 MED ORDER — ACETAMINOPHEN 500 MG PO CAPS: 1000.0000 mg | ORAL_CAPSULE | Freq: Two times a day (BID) | ORAL | Status: AC | PRN

## 2020-01-17 MED ORDER — OXYCODONE HCL 5 MG PO TABS: 2.5000 mg | ORAL_TABLET | Freq: Four times a day (QID) | ORAL | 0 refills | Status: AC | PRN

## 2020-01-17 MED ORDER — ALBUTEROL SULFATE (2.5 MG/3ML) 0.083% IN NEBU
2.5000 mg | INHALATION_SOLUTION | RESPIRATORY_TRACT | 2 refills | Status: AC | PRN
Start: 2020-01-17 — End: 2021-09-15

## 2020-01-17 NOTE — Discharge Summary (Signed)
Physician Discharge Summary  Mackenzie Walsh:[id number] DOB: Jul 18, 1931 DOA: 01/15/2020  PCP: Mick Sell, MD  Admit date: 01/15/2020 Discharge date: 01/17/2020  Admitted From: Home  Disposition:  Home with [facility]   Recommendations for Outpatient Follow-up:  1. Follow up with PCP in 1-2 weeks   Home Health: PT/OT due to pain with ambualtion  Equipment/Devices: Walker, home O2  Discharge Condition: Fair  CODE STATUS: FULL Diet recommendation: Regular  Brief/Interim Summary: Mackenzie Walsh is an 86 y.o. F with CKD III, A. fib on Xarelto, HTN, COPD, AICD, osteoporosis with multiple thoracic wedge compression fractures, hypothyroidism who presented after a fall.  Patient was opening the dishwasher when she fell and hit her right chest and head.  No LOC, but she had right rib pain that was severe and was having trouble breathing.  Brought to the ER where SPO2 83% in triage.  CT of the chest and CT of the head were unremarkable, showed no rib fractures or pneumonia or pulmonary edema.  She was persistently hypoxic and so admitted to the hospital service  .         PRINCIPAL HOSPITAL DIAGNOSIS: Acute hypoxic respiratory failure    Discharge Diagnoses:   Acute hypoxic respiratory failure This was due to splinting from pain in the setting of her severe kyphosis, underlying COPD FEV1 63%, and advanced age.  No pneumonia, pneumothorax, or pulmonary edema contributing.  Doubt PE.  Recommended pain control, which we were able to achieve with oral agents in the hospital, acetaminophen and oxycodone low dose.  Also recommended incentive spirometer, instructed in use.  Lastly recommended O2 use and PCP follow up in 2-3 weeks.  CKD stage IIIa Paroxysmal A. fib  Acquired thrombophilia Hypertension COPD Osteoporosis Hypothyroidism        Discharge Instructions  Discharge Instructions    Discharge instructions   Complete by: As directed    From Dr. Maryfrances Bunnell: You were  admitted for a rib bruise and weakness and trouble breathing.  We ruled out that you had any pneumonia or lung puncture or fluid buildup.  You should resume all your home medicines  For pain: Continue your gabapentin Double your acetaminophen to 1000 mg (2 extra strength tabs), 3 times daily for the next 5 to 7 days, then reduce if things are better You may use oxycodone 2.5 mg as needed If 2.5 mg causes no sleepiness or symptoms, but does not adequately treat pain, you may try a higher dose of 5 mg Go see Dr. Sampson Goon in 1 or 2 weeks Use the incentive spirometer every hour or so If you use the oxycodone once a day or more, you should use MiraLAX to prevent constipation  Use your home albuterol or Duoneb in your nebulizer if you have symptoms (albuterol is the medicine like cough syrup, helps when you have symptoms)     Allergies as of 01/17/2020      Reactions   Erythromycin Base Hives   Gluten Meal Other (See Comments)   Celiac Disease   Dofetilide Rash      Medication List    TAKE these medications   Acetaminophen 500 MG capsule Take 2 capsules (1,000 mg total) by mouth 3 times/day as needed-between meals & bedtime. What changed: how much to take   albuterol (2.5 MG/3ML) 0.083% nebulizer solution Commonly known as: PROVENTIL Take 3 mLs (2.5 mg total) by nebulization every 4 (four) hours as needed for wheezing or shortness of breath.   amiodarone 200 MG tablet  Commonly known as: PACERONE Take 200 mg by mouth daily.   bifidobacterium infantis capsule Take 1 capsule by mouth daily.   budesonide 0.5 MG/2ML nebulizer solution Commonly known as: PULMICORT Inhale 2 mLs into the lungs 2 (two) times daily.   CALCIUM CITRATE PO Take 600 mg by mouth 2 (two) times daily as needed.   carvedilol 12.5 MG tablet Commonly known as: COREG Take 12.5 mg by mouth 2 (two) times daily with a meal.   Citracal Maximum 315-250 MG-UNIT Tabs Generic drug: Calcium Citrate-Vitamin D    CoQ-10 100 MG Caps Take 100 mg by mouth daily.   fluticasone 50 MCG/ACT nasal spray Commonly known as: FLONASE Place 1-2 sprays into both nostrils daily.   furosemide 40 MG tablet Commonly known as: LASIX Take 20 mg by mouth daily.   gabapentin 300 MG capsule Commonly known as: NEURONTIN Take 300 mg by mouth 3 (three) times daily.   levothyroxine 112 MCG tablet Commonly known as: SYNTHROID Take 112 mcg by mouth daily before breakfast.   losartan 25 MG tablet Commonly known as: COZAAR Take 25 mg by mouth daily.   Magnesium Oxide 200 MG Tabs Take 200 mg by mouth daily.   MULTI-VITAMIN DAILY PO Take 1 tablet by mouth daily.   omeprazole 40 MG capsule Commonly known as: PRILOSEC Take 40 mg by mouth daily.   oxyCODONE 5 MG immediate release tablet Commonly known as: Roxicodone Take 0.5-1 tablets (2.5-5 mg total) by mouth every 6 (six) hours as needed for up to 5 days.   simvastatin 10 MG tablet Commonly known as: ZOCOR Take 10 mg by mouth at bedtime.   spironolactone 25 MG tablet Commonly known as: ALDACTONE Take 12.5 mg by mouth daily.   Xarelto 15 MG Tabs tablet Generic drug: Rivaroxaban Take 15 mg by mouth daily with supper.            Durable Medical Equipment  (From admission, onward)         Start     Ordered   01/16/20 1625  For home use only DME oxygen  Once       Question Answer Comment  Length of Need 6 Months   Mode or (Route) Nasal cannula   Liters per Minute 2   Frequency Continuous (stationary and portable oxygen unit needed)   Oxygen conserving device Yes   Oxygen delivery system Gas      01/16/20 1624          Follow-up Information    Mick Sell, MD. Schedule an appointment as soon as possible for a visit in 1 week(s).   Specialty: Infectious Diseases Contact information: [address] [place] [address] [phone]              Allergies  Allergen Reactions  . Erythromycin Base Hives  .  Gluten Meal Other (See Comments)    Celiac Disease  . Dofetilide Rash    Consultations:     Procedures/Studies: DG Ribs Unilateral W/Chest Right  Result Date: 01/15/2020 CLINICAL DATA:  Post fall with right rib pain. EXAM: RIGHT RIBS AND CHEST - 3+ VIEW COMPARISON:  None. FINDINGS: No fracture or other bone lesions are seen involving the ribs. There is no evidence of pneumothorax or pleural effusion. Multi lead left-sided pacemaker in place. Mild cardiomegaly. Normal mediastinal contours. The patient's chin obscures the apices on the frontal view. Bones are diffusely under mineralized. Thoracic spine compression fractures which were better assessed on imaging in May. IMPRESSION: 1.  No visualized right rib fracture or acute intrathoracic abnormality. 2. Mild cardiomegaly. 3. Osteopenia/osteoporosis. Electronically Signed   By: Narda Rutherford M.D.   On: 01/15/2020 16:53   CT Head Wo Contrast  Result Date: 01/15/2020 CLINICAL DATA:  Head trauma, minor.  Fall, hitting head, on Xarelto. EXAM: CT HEAD WITHOUT CONTRAST TECHNIQUE: Contiguous axial images were obtained from the base of the skull through the vertex without intravenous contrast. COMPARISON:  No pertinent prior exams are available for comparison. FINDINGS: Brain: Mild generalized parenchymal atrophy. There is no acute intracranial hemorrhage. No demarcated cortical infarct. No extra-axial fluid collection. No evidence of intracranial mass. No midline shift. Vascular: No hyperdense vessel.  Atherosclerotic calcifications. Skull: Normal. Negative for fracture or focal lesion. Sinuses/Orbits: Visualized orbits show no acute finding. Mild ethmoid sinus mucosal thickening. No significant mastoid effusion at the imaged levels IMPRESSION: No evidence of acute intracranial abnormality. Mild generalized parenchymal atrophy. Mild ethmoid sinus mucosal thickening. Electronically Signed   By: Jackey Loge DO   On: 01/15/2020 16:28   CT Chest Wo  Contrast  Result Date: 01/16/2020 CLINICAL DATA:  Recent fall with right-sided chest pain EXAM: CT CHEST WITHOUT CONTRAST TECHNIQUE: Multidetector CT imaging of the chest was performed following the standard protocol without IV contrast. COMPARISON:  Rib series from the previous day, CT from 11/07/2019. FINDINGS: Cardiovascular: Somewhat limited due to lack of IV contrast. Aortic calcifications are noted. No aneurysmal dilatation is seen. Mild coronary calcifications are noted. Pacing device is seen. No pericardial effusion is noted. Mediastinum/Nodes: Thoracic inlet is within normal limits. No hilar or mediastinal adenopathy is noted. The esophagus as visualized is within normal limits. Lungs/Pleura: Mild atelectatic changes are noted in the bases bilaterally. No focal confluent infiltrate is seen. No effusion or pneumothorax is noted. Upper Abdomen: Visualized upper abdomen shows no acute abnormality. Musculoskeletal: Degenerative changes of the thoracic spine are noted. Compression deformities are noted involving T3, T4, T5, T7, T8 and T9. These have progressed somewhat in the interval particularly at the T3, T4 and T5 levels. No rib abnormality is noted. IMPRESSION: Mild bibasilar atelectasis.  No pneumothorax is noted. No evidence of rib fractures are seen. Progressive thoracic compression deformities particularly in the upper thoracic spine as described. Aortic Atherosclerosis (ICD10-I70.0). Electronically Signed   By: Alcide Clever M.D.   On: 01/16/2020 02:09      Subjective: Patient is feeling somewhat better, she still has mild pain, improved with oral agents.  No fever, sputum, wheezing.  Discharge Exam: Vitals:   01/17/20 1018 01/17/20 1514  BP:  120/71  Pulse: 80 74  Resp: (!) 24 20  Temp:  97.8 F (36.6 C)  SpO2: 98% 98%   Vitals:   01/17/20 0943 01/17/20 1015 01/17/20 1018 01/17/20 1514  BP:    120/71  Pulse: 80 80 80 74  Resp: (!) 25 20 (!) 24 20  Temp:    97.8 F (36.6 C)   TempSrc:    Oral  SpO2: 98% 99% 98% 98%  Weight:      Height:        General: Pt is alert, awake, not in acute distress Cardiovascular: RRR, nl S1-S2, no murmurs appreciated.   No LE edema.   Respiratory: Normal respiratory rate and rhythm.  CTAB without rales or wheezes. Abdominal: Abdomen soft and non-tender.  No distension or HSM.   Neuro/Psych: Strength symmetric in upper and lower extremities.  Judgment and insight appear normal.   The results of significant diagnostics from this hospitalization (  including imaging, microbiology, ancillary and laboratory) are listed below for reference.     Microbiology: Recent Results (from the past 240 hour(s))  SARS Coronavirus 2 by RT PCR (hospital order, performed in [facility] hospital lab) Nasopharyngeal Nasopharyngeal Swab     Status: None   Collection Time: 01/16/20  1:15 AM   Specimen: Nasopharyngeal Swab  Result Value Ref Range Status   SARS Coronavirus 2 NEGATIVE NEGATIVE Final    Comment: (NOTE) SARS-CoV-2 target nucleic acids are NOT DETECTED.  The SARS-CoV-2 RNA is generally detectable in upper and lower respiratory specimens during the acute phase of infection. The lowest concentration of SARS-CoV-2 viral copies this assay can detect is 250 copies / mL. A negative result does not preclude SARS-CoV-2 infection and should not be used as the sole basis for treatment or other patient management decisions.  A negative result may occur with improper specimen collection / handling, submission of specimen other than nasopharyngeal swab, presence of viral mutation(s) within the areas targeted by this assay, and inadequate number of viral copies (<250 copies / mL). A negative result must be combined with clinical observations, patient history, and epidemiological information.  Fact Sheet for Patients:   BoilerBrush.com.cy  Fact Sheet for Healthcare  Providers: https://pope.com/  This test is not yet approved or  cleared by the [place] FDA and has been authorized for detection and/or diagnosis of SARS-CoV-2 by FDA under an Emergency Use Authorization (EUA).  This EUA will remain in effect (meaning this test can be used) for the duration of the COVID-19 declaration under Section 564(b)(1) of the Act, 21 U.S.C. section 360bbb-3(b)(1), unless the authorization is terminated or revoked sooner.  Performed at [facility], [address] [place], [place] [address]      Labs: BNP (last 3 results) No results for input(s): BNP in the last 8760 hours. Basic Metabolic Panel: Recent Labs  Lab 01/16/20 0114  NA 137  K 4.7  CL 98  CO2 28  GLUCOSE 134*  BUN 35*  CREATININE 1.66*  CALCIUM 9.0   Liver Function Tests: Recent Labs  Lab 01/16/20 0114  AST 35  ALT 24  ALKPHOS 51  BILITOT 1.3*  PROT 6.6  ALBUMIN 4.0   No results for input(s): LIPASE, AMYLASE in the last 168 hours. No results for input(s): AMMONIA in the last 168 hours. CBC: Recent Labs  Lab 01/16/20 0114  WBC 8.7  NEUTROABS 6.9  HGB 11.8*  HCT 37.3  MCV 105.1*  PLT 139*   Cardiac Enzymes: No results for input(s): CKTOTAL, CKMB, CKMBINDEX, TROPONINI in the last 168 hours. BNP: Invalid input(s): POCBNP CBG: No results for input(s): GLUCAP in the last 168 hours. D-Dimer No results for input(s): DDIMER in the last 72 hours. Hgb A1c No results for input(s): HGBA1C in the last 72 hours. Lipid Profile No results for input(s): CHOL, HDL, LDLCALC, TRIG, CHOLHDL, LDLDIRECT in the last 72 hours. Thyroid function studies No results for input(s): TSH, T4TOTAL, T3FREE, THYROIDAB in the last 72 hours.  Invalid input(s): FREET3 Anemia work up No results for input(s): VITAMINB12, FOLATE, FERRITIN, TIBC, IRON, RETICCTPCT in the last 72 hours. Urinalysis No results found for: COLORURINE, APPEARANCEUR, LABSPEC, PHURINE,  GLUCOSEU, HGBUR, BILIRUBINUR, KETONESUR, PROTEINUR, UROBILINOGEN, NITRITE, LEUKOCYTESUR Sepsis Labs Invalid input(s): PROCALCITONIN,  WBC,  LACTICIDVEN Microbiology Recent Results (from the past 240 hour(s))  SARS Coronavirus 2 by RT PCR (hospital order, performed in [facility] hospital lab) Nasopharyngeal Nasopharyngeal Swab     Status: None  Collection Time: 01/16/20  1:15 AM   Specimen: Nasopharyngeal Swab  Result Value Ref Range Status   SARS Coronavirus 2 NEGATIVE NEGATIVE Final    Comment: (NOTE) SARS-CoV-2 target nucleic acids are NOT DETECTED.  The SARS-CoV-2 RNA is generally detectable in upper and lower respiratory specimens during the acute phase of infection. The lowest concentration of SARS-CoV-2 viral copies this assay can detect is 250 copies / mL. A negative result does not preclude SARS-CoV-2 infection and should not be used as the sole basis for treatment or other patient management decisions.  A negative result may occur with improper specimen collection / handling, submission of specimen other than nasopharyngeal swab, presence of viral mutation(s) within the areas targeted by this assay, and inadequate number of viral copies (<250 copies / mL). A negative result must be combined with clinical observations, patient history, and epidemiological information.  Fact Sheet for Patients:   BoilerBrush.com.cyhttps://www.fda.gov/media/136312/download  Fact Sheet for Healthcare Providers: https://pope.com/https://www.fda.gov/media/136313/download  This test is not yet approved or  cleared by the [place] FDA and has been authorized for detection and/or diagnosis of SARS-CoV-2 by FDA under an Emergency Use Authorization (EUA).  This EUA will remain in effect (meaning this test can be used) for the duration of the COVID-19 declaration under Section 564(b)(1) of the Act, 21 U.S.C. section 360bbb-3(b)(1), unless the authorization is terminated or revoked sooner.  Performed at [facility],  [address] [place], [place] [id number]      Time coordinating discharge: 35 minutes The [place] controlled substances registry was reviewed for this patient prior to filling the <5 days supply controlled substances script.      SIGNED:   Alberteen Samhristopher P Shantay Sonn, MD  [facility] 01/17/2020, 8:01 PM

## 2020-01-17 NOTE — ED Notes (Signed)
E-signature not working at this time. Pt verbalized understanding of D/C instructions, prescriptions and follow up care with no further questions at this time. Pt in NAD and ambulatory at time of D/C.  

## 2020-01-17 NOTE — Evaluation (Signed)
Occupational Therapy Evaluation Patient Details Name: Mackenzie Walsh MRN: [id number] DOB: 10-16-1931 Today's Date: 01/17/2020    History of Present Illness Per MD Notes: Mackenzie Walsh is a 86 y.o. female with medical history significant for CKD 3, A. fib on Xarelto, HTN, COPD, AICD, history of multiple thoracic wedge compression fractures, hypothyroidism who presents to the emergency room, following an accidental fall when she fell backwards while opening her dishwasher hitting her right chest and head.  Had no LOC she complains of right rib pain when taking a deep breath.  No headache or blurred vision.   O2 sats reportedly as low as 83% in triage.   Clinical Impression   Mackenzie Walsh was seen for OT evaluation this date. Prior to hospital admission, pt was modified independent in all ADL management. She reports using a 4WW for longer distances and denies additional falls history in the last year. Pt lives with her spouse in the Independent living apartments at [facility] where she receives HHPT. Pt's daughter in-law is present t/o session and voices plan for family members to stay with pt for at least a few days after hospital DC. Currently pt demonstrates impairments as described below (See OT problem list) which functionally limit her ability to perform ADL/self-care tasks. She is significantly pain limited at the time of this OT evaluation, and requires supervision for safety with consistent cueing for sequencing during functional mobility, toilet transfer, and toileting this date. She requires significant increased time to perform tasks due to pain. She denies feeling SOB with mobility, however pt noted to desat to spO2 of 90% while on 2L supplemental O2 with mobility. She rebounds to 95% with therapeutic rest break and cues for PLB. MD/RN notified. Pt would benefit from skilled OT services to address noted impairments and functional limitations (see below for any additional details) in order to  maximize safety and independence while minimizing falls risk and caregiver burden. Upon hospital discharge, recommend HHOT to maximize pt safety and return to functional independence during meaningful occupations of daily life.    Follow Up Recommendations  Home health OT;Supervision - Intermittent    Equipment Recommendations  3 in 1 bedside commode    Recommendations for Other Services       Precautions / Restrictions Precautions Precautions: Fall Restrictions Weight Bearing Restrictions: No      Mobility Bed Mobility Overal bed mobility: Needs Assistance Bed Mobility: Rolling;Sidelying to Sit Rolling: Supervision Sidelying to sit: Supervision       General bed mobility comments: Pt requires significant cueing to sequence log-roll technique.  Transfers Overall transfer level: Needs assistance Equipment used: 1 person hand held assist Transfers: Sit to/from Stand Sit to Stand: Supervision         General transfer comment: Pt performs functional mobility in room with supervision for safety and assist to manage lines/leads.    Balance Overall balance assessment: Needs assistance Sitting-balance support: Feet supported;No upper extremity supported Sitting balance-Leahy Scale: Good Sitting balance - Comments: Steady static sitting, reaching within BOS.   Standing balance support: During functional activity;No upper extremity supported;Single extremity supported Standing balance-Leahy Scale: Fair Standing balance comment: Pt able to static stand w/o UE support. Generally holds onto objects in room for stability with functional mobility.                           ADL either performed or assessed with clinical judgement   ADL Overall ADL's : Needs assistance/impaired  General ADL Comments: Pt requires supervision for safety during functional mobility. She moves slowly and is very pain limited t/o  session, however, is able to perform toilet transfer and toileting as supervision level. DIL in room states pt usually totally independent with self-care.     Vision Baseline Vision/History: Wears glasses (Low vision in L eye.) Wears Glasses: At all times Patient Visual Report: No change from baseline       Perception     Praxis      Pertinent Vitals/Pain Pain Assessment: 0-10 Pain Score: 8  Pain Location: back pain. Pain Descriptors / Indicators: Discomfort;Guarding;Grimacing;Sore Pain Intervention(s): Limited activity within patient's tolerance;Monitored during session;Repositioned     Hand Dominance Right   Extremity/Trunk Assessment Upper Extremity Assessment Upper Extremity Assessment: Generalized weakness   Lower Extremity Assessment Lower Extremity Assessment: Generalized weakness   Cervical / Trunk Assessment Cervical / Trunk Assessment: Kyphotic   Communication Communication Communication: No difficulties   Cognition Arousal/Alertness: Awake/alert Behavior During Therapy: WFL for tasks assessed/performed Overall Cognitive Status: Within Functional Limits for tasks assessed                                 General Comments: Pt conversational, and participates well in session despite being pain limited.   General Comments  Pt vitals monitored t/o session. Pt on 2L supplemental O2 t/o session. She is noted to desat to 90% with functional mobility. She rebounds quickly to 95% with therapeutic rest break and cues for PLB.    Exercises Other Exercises Other Exercises: Pt and caregiver educated on role of OT in acute setting, safe use of AE/DME for ADL management, compensatory bed mobility (Log Roll) techniques, falls prevention strategies, energy conservation techniques including pursed lip breathing and activity pacing, and routines modifications to support safety and functional independence upon hospital DC. Other Exercises: OT facilitates bed  mobility, functional transfer to room commode, toileting, and return to bed. Pt moves very slowly and is pain limited t/o requires significant increased time/cueing for mobility.   Shoulder Instructions      Home Living Family/patient expects to be discharged to:: Private residence (Independent living apt at [facility] Living Arrangements: Spouse/significant other Available Help at Discharge: Family;Available 24 hours/day (Pt DIL/SIL plan to provide assist upon hospital DC) Type of Home: Apartment Home Access: Level entry;Elevator (Pt on 5th floor apt.)     Home Layout: One level     Bathroom Shower/Tub: Walk-in shower;Door   [employer] Toilet: Standard     Home Equipment: Environmental consultant - 4 wheels;Cane - single point;Hand held shower head;Grab bars - tub/shower;Toilet riser          Prior Functioning/Environment Level of Independence: Independent with assistive device(s)        Comments: Pt reports walking with a 4WW for longer distances, does not use a device for household mobility but holds onto furniture. Independent for bathing and dressing. Drives, and is a Tourist information centre manager.        OT Problem List: Decreased strength;Pain;Cardiopulmonary status limiting activity;Decreased activity tolerance;Decreased safety awareness;Impaired balance (sitting and/or standing);Decreased knowledge of use of DME or AE;Impaired vision/perception      OT Treatment/Interventions: Self-care/ADL training;Therapeutic exercise;Therapeutic activities;DME and/or AE instruction;Patient/family education;Balance training;Energy conservation    OT Goals(Current goals can be found in the care plan section) Acute Rehab OT Goals Patient Stated Goal: To go home OT Goal Formulation: With patient/family Time For Goal Achievement: 01/31/20 Potential to Achieve Goals: Good ADL  Goals Pt Will Perform Grooming: sitting;with modified independence (c LRAD PRN for improved safety and functional independence.) Pt  Will Perform Lower Body Dressing: sit to/from stand;with set-up;with supervision;with caregiver independent in assisting (c LRAD PRN for improved safety and functional independence.) Pt Will Transfer to Toilet: ambulating;bedside commode;with modified independence (c LRAD PRN for improved safety and functional independence.) Pt Will Perform Toileting - Clothing Manipulation and hygiene: sit to/from stand;with modified independence;with adaptive equipment (c LRAD PRN for improved safety and functional independence.)  OT Frequency: Min 1X/week   Barriers to D/C:            Co-evaluation              AM-PAC OT "6 Clicks" Daily Activity     Outcome Measure Help from another person eating meals?: None Help from another person taking care of personal grooming?: A Little Help from another person toileting, which includes using toliet, bedpan, or urinal?: A Little Help from another person bathing (including washing, rinsing, drying)?: A Little Help from another person to put on and taking off regular upper body clothing?: A Little Help from another person to put on and taking off regular lower body clothing?: A Little 6 Click Score: 19   End of Session Nurse Communication: Mobility status;Other (comment) (O2 sats during session.)  Activity Tolerance: Patient tolerated treatment well Patient left: in bed;with call bell/phone within reach;with family/visitor present  OT Visit Diagnosis: Other abnormalities of gait and mobility (R26.89);Muscle weakness (generalized) (M62.81);Pain Pain - Right/Left:  (Both) Pain - part of body:  (Back)                Time: 4315-4008 OT Time Calculation (min): 54 min Charges:  OT General Charges $OT Visit: 1 Visit OT Evaluation $OT Eval Moderate Complexity: 1 Mod OT Treatments $Self Care/Home Management : 38-52 mins  Rockney Ghee, M.S., OTR/L Ascom: [phone] 01/17/20, 11:12 AM

## 2020-01-17 NOTE — TOC Initial Note (Signed)
Transition of Care [facility]) - Initial/Assessment Note    Patient Details  Name: Mackenzie Walsh MRN: [id number] Date of Birth: 07-15-1931  Transition of [facility]) CM/SW Contact:    Joseph Art, LCSWA Phone Number: 01/17/2020, 11:31 AM  Clinical Narrative:                  Patient presents to [facility] due to fall.  CSW spoke with patient and Gertie Baron (Daughter)  [phone]. CSW informed to patient and Ms. Vanhover about TOC and SNF and home health process.  Patient has DME order for portable oxygen, Zach at [facility] has been notified. PT is currently working with patient for assessment and PT recommendation.   This CSW informed Cala Bradford at [facility] of [place] about possible SNF or home health recommendation and I was told they would not be able to waiver Medicare 3 night stay of the recommendation was SNF.  CSW verbalized understanding.  Ms. Santina Evans stated if home health is recommended for patient they choose [facility].  Patient disposition pending on PT consult recommendation.  Expected Discharge Plan: Assisted Living     Patient Goals and CMS Choice Patient states their goals for this hospitalization and ongoing recovery are:: to return home CMS Medicare.gov Compare Post Acute Care list provided to:: Other (Comment Required) Santina Evans, Diannia Ruder (Daughter) [phone]) Choice offered to / list presented to : Adult Children  Expected Discharge Plan and Services Expected Discharge Plan: Assisted Living In-house Referral: Clinical Social Work       Expected Discharge Date: 01/17/20               DME Arranged: Oxygen DME Agency: [facility] Date DME Agency Contacted: 01/17/20 Time DME Agency Contacted: [phone] Representative spoke with at DME Agency: Oletha Cruel, PT            Prior Living Arrangements/Services   Lives with:: Facility Resident Patient language and need for interpreter reviewed:: Yes Do you feel safe going back to the place where you  live?: Yes      Need for Family Participation in Patient Care: Yes (Comment) Care giver support system in place?: Yes (comment)   Criminal Activity/Legal Involvement Pertinent to Current Situation/Hospitalization: No - Comment as needed  Activities of Daily Living Home Assistive Devices/Equipment: Cane (specify quad or straight), Eyeglasses ADL Screening (condition at time of admission) Patient's cognitive ability adequate to safely complete daily activities?: Yes Is the patient deaf or have difficulty hearing?: No Does the patient have difficulty seeing, even when wearing glasses/contacts?: No Does the patient have difficulty concentrating, remembering, or making decisions?: No Patient able to express need for assistance with ADLs?: Yes Does the patient have difficulty dressing or bathing?: No Independently performs ADLs?: Yes (appropriate for developmental age) Does the patient have difficulty walking or climbing stairs?: Yes Weakness of Legs: None Weakness of Arms/Hands: None  Permission Sought/Granted Permission sought to share information with : Family Supports, Magazine features editor (Santina Evans, [place] (Daughter) [phone])    Share Information with NAME: Gertie Baron (Daughter) [phone]  Permission granted to share info w AGENCY: [place], Cala Bradford     Permission granted to share info w Contact Information: [phone]  Emotional Assessment Appearance:: Appears stated age Attitude/Demeanor/Rapport: Gracious Affect (typically observed): Calm Orientation: : Oriented to Self, Oriented to Place, Oriented to Situation Alcohol / Substance Use: Not Applicable Psych Involvement: No (comment)  Admission diagnosis:  Acute respiratory failure with hypoxia (HCC) [J96.01] Hypoxia [R09.02] Patient Active Problem List  Diagnosis Date Noted  . COPD with chronic bronchitis (HCC) 01/16/2020  . Chronic diastolic CHF (congestive heart failure) (HCC)  01/16/2020  . Chronic anticoagulation 01/16/2020  . Acute respiratory failure with hypoxia (HCC) 01/16/2020  . Fall at home, initial encounter 01/16/2020  . Compression fracture of thoracic spine, non-traumatic (HCC) 01/16/2020  . Hypoxia 01/16/2020   PCP:  Mick Sell, MD Pharmacy:   [facility] [place], [place] - [address] [place] [place] [address] Phone: [phone] Fax: [phone]     Social Determinants of Health (SDOH) Interventions    Readmission Risk Interventions No flowsheet data found.

## 2020-01-17 NOTE — ED Notes (Signed)
Pt ambulating in hall with PT and walker with minimal assistance.

## 2020-01-17 NOTE — Evaluation (Signed)
Physical Therapy Evaluation Patient Details Name: Mackenzie Walsh MRN: [id number] DOB: [birth year] Today's Date: 01/17/2020   History of Present Illness  Per MD Notes: Mackenzie Walsh is a 86 y.o. female with medical history significant for CKD 3, A. fib on Xarelto, HTN, COPD, AICD, history of multiple thoracic wedge compression fractures, hypothyroidism who presents to the emergency room, following an accidental fall when she fell backwards while opening her dishwasher hitting her right chest and head.  Had no LOC she complains of right rib pain when taking a deep breath.  No headache or blurred vision.   O2 sats reportedly as low as 83% in triage.     Clinical Impression  Pt received in bed upon arrival to room, with DIL present and case worker in room as well.  Pt is able to transfer in bed with supervision and with use of the handrails for support.  Upon sitting EOB, pt able to maintain balance and reach within BOS.  Pt then able to sit <> stand with use of the FWW and remain upright while vO2 was monitored.  Pt able to ambulate to doorway with CGA and vitals were reassessed.  Pt O2 de-sat to 84% and unable to remain above 90% while standing.  Nursing notified of transition to 3L of from 2L, and was able to rebound to >90% and was maintained throughout walk.  Pt and family member will need education on use of O2 which will be provided.  SpO2 stats were difficult at times to obtain due to cold hands, but pt able to communicate well and did not exhibit any signs of duress throughout treatment session.  Pt will benefit from PT services in a HHPT setting upon discharge to safely address deficits listed in patient problem list for decreased caregiver assistance and eventual return to PLOF.       Follow Up Recommendations Home health PT    Equipment Recommendations  None recommended by PT    Recommendations for Other Services       Precautions / Restrictions Precautions Precautions:  Fall Restrictions Weight Bearing Restrictions: No      Mobility  Bed Mobility Overal bed mobility: Needs Assistance Bed Mobility: Rolling;Sidelying to Sit Rolling: Supervision Sidelying to sit: Supervision       General bed mobility comments: Pt able to utilize bedrails to come into seated position.  Transfers Overall transfer level: Needs assistance Equipment used: 1 person hand held assist Transfers: Sit to/from Stand Sit to Stand: Supervision         General transfer comment: Pt performs functional mobility in room with supervision for safety and assist to manage O2 tank.  Ambulation/Gait Ambulation/Gait assistance: Min guard Gait Distance (Feet): 200 Feet Assistive device: Rolling walker (2 wheeled) Gait Pattern/deviations: Step-through pattern;Decreased step length - right;Decreased step length - left     General Gait Details: slowed cadence and has kyphosis that prevents her from retaining upright posture.  Pt did take 3 standing rest breaks while SpO2 was attempted to be mointored.  Stairs            Wheelchair Mobility    Modified Rankin (Stroke Patients Only)       Balance Overall balance assessment: Needs assistance Sitting-balance support: Feet supported;No upper extremity supported Sitting balance-Leahy Scale: Good Sitting balance - Comments: Steady static sitting, reaching within BOS.   Standing balance support: During functional activity Standing balance-Leahy Scale: Fair Standing balance comment: Pt able to static stand w/o UE support. Generally holds onto  objects in room for stability with functional mobility.                             Pertinent Vitals/Pain Pain Assessment: 0-10 Pain Score: 8  Pain Location: back pain. Pain Descriptors / Indicators: Discomfort;Guarding;Grimacing;Sore Pain Intervention(s): Limited activity within patient's tolerance;Monitored during session;Repositioned    Home Living Family/patient  expects to be discharged to:: Private residence (Independent living apt at [facility] Living Arrangements: Spouse/significant other Available Help at Discharge: Family;Available 24 hours/day (Pt DIL/SIL plan to provide assist upon hospital DC) Type of Home: Apartment Home Access: Level entry;Elevator (Pt on 5th floor apt.)     Home Layout: One level Home Equipment: Walker - 4 wheels;Cane - single point;Hand held shower head;Grab bars - tub/shower;Toilet riser      Prior Function Level of Independence: Independent with assistive device(s)         Comments: Pt reports walking with a 4WW for longer distances, does not use a device for household mobility but holds onto furniture. Independent for bathing and dressing. Drives, and is a Tourist information centre manager.     Hand Dominance   Dominant Hand: Right    Extremity/Trunk Assessment   Upper Extremity Assessment Upper Extremity Assessment: Generalized weakness    Lower Extremity Assessment Lower Extremity Assessment: Generalized weakness    Cervical / Trunk Assessment Cervical / Trunk Assessment: Kyphotic  Communication   Communication: No difficulties  Cognition Arousal/Alertness: Awake/alert Behavior During Therapy: WFL for tasks assessed/performed Overall Cognitive Status: Within Functional Limits for tasks assessed                                 General Comments: Pt conversational, and participates well in session despite being pain limited.      General Comments General comments (skin integrity, edema, etc.): Pt O2 stats/Hr were monitored during session.  Pt initially on 2L of O2 but was increased after notifying stats of drop to 88% when reaching the door of the room.  Pt responded well to increase and verbal cuing of proper breathing and was able to maintain >90% throughout rest of treatment session.    Exercises Total Joint Exercises Ankle Circles/Pumps: AROM;Strengthening;Both;10 reps Long Arc Quad:  AROM;Both;10 reps Knee Flexion: AROM;Both;10 reps Marching in Standing: AROM;Both;10 reps;Standing Other Exercises Other Exercises: Pt and caregiver educated on role of OT in acute setting, safe use of AE/DME for ADL management, compensatory bed mobility (Log Roll) techniques, falls prevention strategies, energy conservation techniques including pursed lip breathing and activity pacing, and routines modifications to support safety and functional independence upon hospital DC. Other Exercises: OT facilitates bed mobility, functional transfer to room commode, toileting, and return to bed. Pt moves very slowly and is pain limited t/o requires significant increased time/cueing for mobility.   Assessment/Plan    PT Assessment Patient needs continued PT services  PT Problem List Decreased strength;Decreased mobility;Decreased safety awareness;Decreased range of motion;Decreased activity tolerance;Decreased balance;Decreased knowledge of use of DME;Pain       PT Treatment Interventions DME instruction;Gait training;Functional mobility training;Therapeutic activities;Therapeutic exercise;Balance training;Patient/family education    PT Goals (Current goals can be found in the Care Plan section)  Acute Rehab PT Goals Patient Stated Goal: To go home PT Goal Formulation: With patient/family Time For Goal Achievement: 01/31/20 Potential to Achieve Goals: Good    Frequency Min 2X/week   Barriers to discharge  Co-evaluation               AM-PAC PT "6 Clicks" Mobility  Outcome Measure Help needed turning from your back to your side while in a flat bed without using bedrails?: A Little Help needed moving from lying on your back to sitting on the side of a flat bed without using bedrails?: A Little Help needed moving to and from a bed to a chair (including a wheelchair)?: A Little Help needed standing up from a chair using your arms (e.g., wheelchair or bedside chair)?: A Little Help  needed to walk in hospital room?: A Little Help needed climbing 3-5 steps with a railing? : A Little 6 Click Score: 18    End of Session Equipment Utilized During Treatment: Gait belt Activity Tolerance: Patient tolerated treatment well;No increased pain;Patient limited by fatigue Patient left: in bed;with call bell/phone within reach;with family/visitor present Nurse Communication: Mobility status PT Visit Diagnosis: Unsteadiness on feet (R26.81);Other abnormalities of gait and mobility (R26.89);Muscle weakness (generalized) (M62.81);History of falling (Z91.81);Difficulty in walking, not elsewhere classified (R26.2);Pain Pain - part of body:  (Back)    Time:  -      Charges:   PT Evaluation $PT Eval Low Complexity: 1 Low PT Treatments $Gait Training: 8-22 mins        Nolon Bussing, PT, DPT 01/17/20, 12:17 PM

## 2020-01-28 ENCOUNTER — Encounter (INDEPENDENT_AMBULATORY_CARE_PROVIDER_SITE_OTHER): Payer: Medicare Other | Admitting: Cardiovascular Disease

## 2020-01-28 DIAGNOSIS — Z95810 Presence of automatic (implantable) cardiac defibrillator: Secondary | ICD-10-CM

## 2020-02-20 ENCOUNTER — Encounter (INDEPENDENT_AMBULATORY_CARE_PROVIDER_SITE_OTHER): Payer: Self-pay

## 2020-02-20 NOTE — Progress Notes (Signed)
Patient sent remote transmit as scheduled on 02/20/20. Battery life and Leads are stable. Additional information available by request.

## 2020-02-23 ENCOUNTER — Encounter (INDEPENDENT_AMBULATORY_CARE_PROVIDER_SITE_OTHER): Payer: Self-pay

## 2020-02-23 NOTE — Progress Notes (Signed)
Patient sent remote transmit as scheduled on 06.01.21. Battery life and Leads are stable. Additional information available by request.

## 2020-03-11 ENCOUNTER — Encounter: Payer: Medicare Other | Attending: Pulmonary Disease | Admitting: *Deleted

## 2020-03-11 ENCOUNTER — Encounter: Payer: Self-pay | Admitting: *Deleted

## 2020-03-11 ENCOUNTER — Other Ambulatory Visit: Payer: Self-pay

## 2020-03-11 DIAGNOSIS — I110 Hypertensive heart disease with heart failure: Secondary | ICD-10-CM | POA: Insufficient documentation

## 2020-03-11 DIAGNOSIS — Z79890 Hormone replacement therapy (postmenopausal): Secondary | ICD-10-CM | POA: Insufficient documentation

## 2020-03-11 DIAGNOSIS — Z95810 Presence of automatic (implantable) cardiac defibrillator: Secondary | ICD-10-CM | POA: Insufficient documentation

## 2020-03-11 DIAGNOSIS — Z79899 Other long term (current) drug therapy: Secondary | ICD-10-CM | POA: Insufficient documentation

## 2020-03-11 DIAGNOSIS — M810 Age-related osteoporosis without current pathological fracture: Secondary | ICD-10-CM | POA: Insufficient documentation

## 2020-03-11 DIAGNOSIS — E785 Hyperlipidemia, unspecified: Secondary | ICD-10-CM | POA: Insufficient documentation

## 2020-03-11 DIAGNOSIS — I5022 Chronic systolic (congestive) heart failure: Secondary | ICD-10-CM | POA: Insufficient documentation

## 2020-03-11 DIAGNOSIS — E039 Hypothyroidism, unspecified: Secondary | ICD-10-CM | POA: Insufficient documentation

## 2020-03-11 DIAGNOSIS — J9611 Chronic respiratory failure with hypoxia: Secondary | ICD-10-CM | POA: Insufficient documentation

## 2020-03-11 DIAGNOSIS — I4891 Unspecified atrial fibrillation: Secondary | ICD-10-CM | POA: Insufficient documentation

## 2020-03-11 DIAGNOSIS — G894 Chronic pain syndrome: Secondary | ICD-10-CM | POA: Insufficient documentation

## 2020-03-11 NOTE — Progress Notes (Signed)
Virtual orientation call completed today. shehas an appointment on Date: 03/12/2020 for EP eval and gym Orientation.  Documentation of diagnosis can be found in [facility] Date: 01/30/2020.

## 2020-03-12 ENCOUNTER — Encounter: Payer: Medicare Other | Admitting: *Deleted

## 2020-03-12 VITALS — Ht 62.2 in | Wt 133.0 lb

## 2020-03-12 DIAGNOSIS — Z95810 Presence of automatic (implantable) cardiac defibrillator: Secondary | ICD-10-CM | POA: Diagnosis not present

## 2020-03-12 DIAGNOSIS — E785 Hyperlipidemia, unspecified: Secondary | ICD-10-CM | POA: Diagnosis not present

## 2020-03-12 DIAGNOSIS — Z79899 Other long term (current) drug therapy: Secondary | ICD-10-CM | POA: Diagnosis not present

## 2020-03-12 DIAGNOSIS — I4891 Unspecified atrial fibrillation: Secondary | ICD-10-CM | POA: Diagnosis not present

## 2020-03-12 DIAGNOSIS — I5022 Chronic systolic (congestive) heart failure: Secondary | ICD-10-CM | POA: Diagnosis not present

## 2020-03-12 DIAGNOSIS — G894 Chronic pain syndrome: Secondary | ICD-10-CM | POA: Diagnosis not present

## 2020-03-12 DIAGNOSIS — I110 Hypertensive heart disease with heart failure: Secondary | ICD-10-CM | POA: Diagnosis not present

## 2020-03-12 DIAGNOSIS — M810 Age-related osteoporosis without current pathological fracture: Secondary | ICD-10-CM | POA: Diagnosis not present

## 2020-03-12 DIAGNOSIS — J9611 Chronic respiratory failure with hypoxia: Secondary | ICD-10-CM

## 2020-03-12 DIAGNOSIS — Z79890 Hormone replacement therapy (postmenopausal): Secondary | ICD-10-CM | POA: Diagnosis not present

## 2020-03-12 DIAGNOSIS — E039 Hypothyroidism, unspecified: Secondary | ICD-10-CM | POA: Diagnosis not present

## 2020-03-12 NOTE — Progress Notes (Signed)
Pulmonary Individual Treatment Plan  Patient Details  Name: Mackenzie Walsh MRN: [id number] Date of Birth: Aug 19, 1931 Referring Provider:     Pulmonary Rehab from 03/12/2020 in [facility] Cardiac and Pulmonary Rehab  Referring Provider Lanney Gins, Fraud MD      Initial Encounter Date:    Pulmonary Rehab from 03/12/2020 in [facility] Cardiac and Pulmonary Rehab  Date 03/12/20      Visit Diagnosis: Chronic respiratory failure with hypoxia ([place])  Patient's Home Medications on Admission:  Current Outpatient Medications:  .  Acetaminophen 500 MG capsule, Take 2 capsules (1,000 mg total) by mouth 3 times/day as needed-between meals & bedtime., Disp: 30 capsule, Rfl:  .  albuterol (PROVENTIL) (2.5 MG/3ML) 0.083% nebulizer solution, Take 3 mLs (2.5 mg total) by nebulization every 4 (four) hours as needed for wheezing or shortness of breath., Disp: 75 mL, Rfl: 2 .  amiodarone (PACERONE) 200 MG tablet, Take 200 mg by mouth daily. , Disp: , Rfl:  .  bifidobacterium infantis (ALIGN) capsule, Take 1 capsule by mouth daily., Disp: , Rfl:  .  budesonide (PULMICORT) 0.5 MG/2ML nebulizer solution, Inhale 2 mLs into the lungs 2 (two) times daily. (Patient not taking: Reported on 03/11/2020), Disp: , Rfl:  .  CALCIUM CITRATE PO, Take 600 mg by mouth 2 (two) times daily as needed., Disp: , Rfl:  .  carvedilol (COREG) 12.5 MG tablet, Take 12.5 mg by mouth 2 (two) times daily with a meal. , Disp: , Rfl:  .  CITRACAL MAXIMUM 315-250 MG-UNIT TABS, , Disp: , Rfl:  .  Coenzyme Q10 (COQ-10) 100 MG CAPS, Take 100 mg by mouth daily. , Disp: , Rfl:  .  fluticasone (FLONASE) 50 MCG/ACT nasal spray, Place 1-2 sprays into both nostrils daily. , Disp: , Rfl:  .  furosemide (LASIX) 40 MG tablet, Take 20 mg by mouth daily., Disp: , Rfl:  .  gabapentin (NEURONTIN) 300 MG capsule, Take 300 mg by mouth 3 (three) times daily. , Disp: , Rfl:  .  levothyroxine (SYNTHROID) 112 MCG tablet, Take 112 mcg by mouth daily before breakfast. ,  Disp: , Rfl:  .  losartan (COZAAR) 25 MG tablet, Take 25 mg by mouth daily. , Disp: , Rfl:  .  Magnesium Oxide 200 MG TABS, Take 200 mg by mouth daily. , Disp: , Rfl:  .  Multiple Vitamin (MULTI-VITAMIN DAILY PO), Take 1 tablet by mouth daily.  (Patient not taking: Reported on 03/11/2020), Disp: , Rfl:  .  omeprazole (PRILOSEC) 40 MG capsule, Take 40 mg by mouth daily., Disp: , Rfl:  .  Rivaroxaban (XARELTO) 15 MG TABS tablet, Take 15 mg by mouth daily with supper. , Disp: , Rfl:  .  simvastatin (ZOCOR) 10 MG tablet, Take 10 mg by mouth at bedtime., Disp: , Rfl:  .  spironolactone (ALDACTONE) 25 MG tablet, Take 12.5 mg by mouth daily. , Disp: , Rfl:   Past Medical History: Past Medical History:  Diagnosis Date  . A-fib ([place])   . Allergy   . Anemia   . Arrhythmia   . Atrial fibrillation ([place])   . Broken femur ([place]) 2017  . Cataract   . Celiac disease   . Chronic pain syndrome   . Chronic systolic CHF (congestive heart failure) ([place])   . CKD (chronic kidney disease)   . COPD (chronic obstructive pulmonary disease) ([place])   . Dilated cardiomyopathy ([place])   . Hyperlipidemia   . Hypertension   . Migraines   . Mitral valve  regurgitation   . Osteoporosis   . Posterior rhinorrhea   . Scoliosis deformity of spine   . Senile hyperkeratosis   . Thyroid disease    hypothyroid  . Ventricular tachyarrhythmia (H[facility])     Tobacco Use: Social History   Tobacco Use  Smoking Status Never Smoker  Smokeless Tobacco Never Used    Labs: Recent Review Flowsheet Data   There is no flowsheet data to display.      Pulmonary Assessment Scores:  Pulmonary Assessment Scores    Row Name 03/12/20 1129         ADL UCSD   ADL Phase Entry     SOB Score total 61     Rest 0     Walk 3     Stairs 5     Bath 2     Dress 2     Shop 3       CAT Score   CAT Score 12       mMRC Score   mMRC Score 3            UCSD: Self-administered rating of dyspnea associated with activities of  daily living (ADLs) 6-point scale (0 = "not at all" to 5 = "maximal or unable to do because of breathlessness")  Scoring Scores range from 0 to 120.  Minimally important difference is 5 units  CAT: CAT can identify the health impairment of COPD patients and is better correlated with disease progression.  CAT has a scoring range of zero to 40. The CAT score is classified into four groups of low (less than 10), medium (10 - 20), high (21-30) and very high (31-40) based on the impact level of disease on health status. A CAT score over 10 suggests significant symptoms.  A worsening CAT score could be explained by an exacerbation, poor medication adherence, poor inhaler technique, or progression of COPD or comorbid conditions.  CAT MCID is 2 points  mMRC: mMRC (Modified Medical Research Council) Dyspnea Scale is used to assess the degree of baseline functional disability in patients of respiratory disease due to dyspnea. No minimal important difference is established. A decrease in score of 1 point or greater is considered a positive change.   Pulmonary Function Assessment:   Exercise Target Goals: Exercise Program Goal: Individual exercise prescription set using results from initial 6 min walk test and THRR while considering  patient's activity barriers and safety.   Exercise Prescription Goal: Initial exercise prescription builds to 30-45 minutes a day of aerobic activity, 2-3 days per week.  Home exercise guidelines will be given to patient during program as part of exercise prescription that the participant will acknowledge.  Education: Aerobic Exercise & Resistance Training: - Gives group verbal and written instruction on the various components of exercise. Focuses on aerobic and resistive training programs and the benefits of this training and how to safely progress through these programs..   Education: Exercise & Equipment Safety: - Individual verbal instruction and demonstration of  equipment use and safety with use of the equipment.   Pulmonary Rehab from 03/12/2020 in [facility] Cardiac and Pulmonary Rehab  Date 03/12/20  Educator [facility]  Instruction Review Code 1- Verbalizes Understanding      Education: Exercise Physiology & General Exercise Guidelines: - Group verbal and written instruction with models to review the exercise physiology of the cardiovascular system and associated critical values. Provides general exercise guidelines with specific guidelines to those with heart or lung disease.    Education:  Flexibility, Balance, Mind/Body Relaxation: Provides group verbal/written instruction on the benefits of flexibility and balance training, including mind/body exercise modes such as yoga, pilates and tai chi.  Demonstration and skill practice provided.   Activity Barriers & Risk Stratification:  Activity Barriers & Cardiac Risk Stratification - 03/12/20 1122      Activity Barriers & Cardiac Risk Stratification   Activity Barriers Right Knee Replacement;Other (comment);Neck/Spine Problems;Shortness of Breath;Deconditioning;Muscular Weakness;Back Problems;Balance Concerns;Assistive Device;History of Falls    Comments Rod in femur, scoliosis, spinal stenosis, marginal vision in left eye, limited ROM from ICD, kyphosis, scoliosis           6 Minute Walk:  6 Minute Walk    Row Name 03/12/20 1120         6 Minute Walk   Phase Initial     Distance 410 feet     Walk Time 4.32 minutes     # of Rest Breaks 3  7 sec, 50 sec, 32 sec     MPH 1.08     METS 0.52     RPE 17     Perceived Dyspnea  4     VO2 Peak 1.82     Symptoms Yes (comment)     Comments SOB     Resting HR 81 bpm     Resting BP 130/64     Resting Oxygen Saturation  94 %     Exercise Oxygen Saturation  during 6 min walk 89 %     Max Ex. HR 95 bpm     Max Ex. BP 134/66     2 Minute Post BP 128/64       Interval HR   1 Minute HR 95     2 Minute HR 90     3 Minute HR 85     4 Minute HR 89      5 Minute HR 83     6 Minute HR 94     2 Minute Post HR 80     Interval Heart Rate? Yes       Interval Oxygen   Interval Oxygen? Yes     Baseline Oxygen Saturation % 94 %     1 Minute Oxygen Saturation % 92 %     1 Minute Liters of Oxygen 0 L  Room Air     2 Minute Oxygen Saturation % 89 %     2 Minute Liters of Oxygen 0 L     3 Minute Oxygen Saturation % 92 %     3 Minute Liters of Oxygen 0 L     4 Minute Oxygen Saturation % 90 %     4 Minute Liters of Oxygen 0 L     5 Minute Oxygen Saturation % 91 %     5 Minute Liters of Oxygen 0 L     6 Minute Oxygen Saturation % 89 %     6 Minute Liters of Oxygen 0 L     2 Minute Post Oxygen Saturation % 94 %     2 Minute Post Liters of Oxygen 0 L           Oxygen Initial Assessment:  Oxygen Initial Assessment - 03/12/20 1129      Home Oxygen   Home Oxygen Device Home Concentrator;E-Tanks    Sleep Oxygen Prescription Continuous    Liters per minute 2    Home Exercise Oxygen Prescription None    Home at Rest Exercise Oxygen Prescription None  Compliance with Home Oxygen Use Yes      Initial 6 min Walk   Oxygen Used None      Program Oxygen Prescription   Program Oxygen Prescription None      Intervention   Short Term Goals To learn and exhibit compliance with exercise, home and travel O2 prescription;To learn and understand importance of monitoring SPO2 with pulse oximeter and demonstrate accurate use of the pulse oximeter.;To learn and understand importance of maintaining oxygen saturations>88%;To learn and demonstrate proper pursed lip breathing techniques or other breathing techniques.;To learn and demonstrate proper use of respiratory medications    Long  Term Goals Exhibits compliance with exercise, home and travel O2 prescription;Verbalizes importance of monitoring SPO2 with pulse oximeter and return demonstration;Maintenance of O2 saturations>88%;Exhibits proper breathing techniques, such as pursed lip breathing or other  method taught during program session;Compliance with respiratory medication;Demonstrates proper use of MDI's           Oxygen Re-Evaluation:   Oxygen Discharge (Final Oxygen Re-Evaluation):   Initial Exercise Prescription:  Initial Exercise Prescription - 03/12/20 1100      Date of Initial Exercise RX and Referring Provider   Date 03/12/20    Referring Provider Lanney Gins, Fraud MD      Treadmill   MPH 0.7    Grade 0    Minutes 15    METs 1.5      Recumbant Bike   Level 1    RPM 50    Watts 5    Minutes 15    METs 1.5      NuStep   Level 1    SPM 80    Minutes 15    METs 1.5      REL-XR   Level 1    Speed 50    Minutes 15    METs 1.5      Prescription Details   Frequency (times per week) 3    Duration Progress to 30 minutes of continuous aerobic without signs/symptoms of physical distress      Intensity   THRR 40-80% of Max Heartrate 102-123    Ratings of Perceived Exertion 11-13    Perceived Dyspnea 0-4      Progression   Progression Continue to progress workloads to maintain intensity without signs/symptoms of physical distress.      Resistance Training   Training Prescription Yes    Weight 2 lb    Reps 10-15           Perform Capillary Blood Glucose checks as needed.  Exercise Prescription Changes:  Exercise Prescription Changes    Row Name 03/12/20 1100             Response to Exercise   Blood Pressure (Admit) 130/64       Blood Pressure (Exercise) 134/66       Blood Pressure (Exit) 128/64       Heart Rate (Admit) 81 bpm       Heart Rate (Exercise) 95 bpm       Heart Rate (Exit) 79 bpm       Oxygen Saturation (Admit) 94 %       Oxygen Saturation (Exercise) 89 %       Oxygen Saturation (Exit) 94 %       Rating of Perceived Exertion (Exercise) 17       Perceived Dyspnea (Exercise) 4       Symptoms SOB       Comments walk test results  Exercise Comments:   Exercise Goals and Review:  Exercise Goals    Row  Name 03/12/20 1125             Exercise Goals   Increase Physical Activity Yes       Intervention Provide advice, education, support and counseling about physical activity/exercise needs.;Develop an individualized exercise prescription for aerobic and resistive training based on initial evaluation findings, risk stratification, comorbidities and participant's personal goals.       Expected Outcomes Short Term: Attend rehab on a regular basis to increase amount of physical activity.;Long Term: Add in home exercise to make exercise part of routine and to increase amount of physical activity.;Long Term: Exercising regularly at least 3-5 days a week.       Increase Strength and Stamina Yes       Intervention Provide advice, education, support and counseling about physical activity/exercise needs.;Develop an individualized exercise prescription for aerobic and resistive training based on initial evaluation findings, risk stratification, comorbidities and participant's personal goals.       Expected Outcomes Short Term: Increase workloads from initial exercise prescription for resistance, speed, and METs.;Long Term: Improve cardiorespiratory fitness, muscular endurance and strength as measured by increased METs and functional capacity (6MWT);Short Term: Perform resistance training exercises routinely during rehab and add in resistance training at home       Able to understand and use rate of perceived exertion (RPE) scale Yes       Intervention Provide education and explanation on how to use RPE scale       Expected Outcomes Short Term: Able to use RPE daily in rehab to express subjective intensity level;Long Term:  Able to use RPE to guide intensity level when exercising independently       Able to understand and use Dyspnea scale Yes       Intervention Provide education and explanation on how to use Dyspnea scale       Expected Outcomes Short Term: Able to use Dyspnea scale daily in rehab to express  subjective sense of shortness of breath during exertion;Long Term: Able to use Dyspnea scale to guide intensity level when exercising independently       Knowledge and understanding of Target Heart Rate Range (THRR) Yes       Intervention Provide education and explanation of THRR including how the numbers were predicted and where they are located for reference       Expected Outcomes Short Term: Able to state/look up THRR;Short Term: Able to use daily as guideline for intensity in rehab;Long Term: Able to use THRR to govern intensity when exercising independently       Able to check pulse independently Yes       Intervention Provide education and demonstration on how to check pulse in carotid and radial arteries.;Review the importance of being able to check your own pulse for safety during independent exercise       Expected Outcomes Short Term: Able to explain why pulse checking is important during independent exercise;Long Term: Able to check pulse independently and accurately       Understanding of Exercise Prescription Yes       Intervention Provide education, explanation, and written materials on patient's individual exercise prescription       Expected Outcomes Short Term: Able to explain program exercise prescription;Long Term: Able to explain home exercise prescription to exercise independently              Exercise Goals Re-Evaluation :  Discharge Exercise Prescription (Final Exercise Prescription Changes):  Exercise Prescription Changes - 03/12/20 1100      Response to Exercise   Blood Pressure (Admit) 130/64    Blood Pressure (Exercise) 134/66    Blood Pressure (Exit) 128/64    Heart Rate (Admit) 81 bpm    Heart Rate (Exercise) 95 bpm    Heart Rate (Exit) 79 bpm    Oxygen Saturation (Admit) 94 %    Oxygen Saturation (Exercise) 89 %    Oxygen Saturation (Exit) 94 %    Rating of Perceived Exertion (Exercise) 17    Perceived Dyspnea (Exercise) 4    Symptoms SOB    Comments  walk test results           Nutrition:  Target Goals: Understanding of nutrition guidelines, daily intake of sodium <1538m, cholesterol <2085m calories 30% from fat and 7% or less from saturated fats, daily to have 5 or more servings of fruits and vegetables.  Education: Controlling Sodium/Reading Food Labels -Group verbal and written material supporting the discussion of sodium use in heart healthy nutrition. Review and explanation with models, verbal and written materials for utilization of the food label.   Education: General Nutrition Guidelines/Fats and Fiber: -Group instruction provided by verbal, written material, models and posters to present the general guidelines for heart healthy nutrition. Gives an explanation and review of dietary fats and fiber.   Biometrics:  Pre Biometrics - 03/12/20 1126      Pre Biometrics   Height 5' 2.2" (1.58 m)    Weight 133 lb (60.3 kg)    BMI (Calculated) 24.17    Single Leg Stand 1.3 seconds            Nutrition Therapy Plan and Nutrition Goals:   Nutrition Assessments:  Nutrition Assessments - 03/12/20 1126      MEDFICTS Scores   Pre Score 67           MEDIFICTS Score Key:          ?70 Need to make dietary changes          40-70 Heart Healthy Diet         ? 40 Therapeutic Level Cholesterol Diet  Nutrition Goals Re-Evaluation:   Nutrition Goals Discharge (Final Nutrition Goals Re-Evaluation):   Psychosocial: Target Goals: Acknowledge presence or absence of significant depression and/or stress, maximize coping skills, provide positive support system. Participant is able to verbalize types and ability to use techniques and skills needed for reducing stress and depression.   Education: Depression - Provides group verbal and written instruction on the correlation between heart/lung disease and depressed mood, treatment options, and the stigmas associated with seeking treatment.   Education: Sleep Hygiene -Provides  group verbal and written instruction about how sleep can affect your health.  Define sleep hygiene, discuss sleep cycles and impact of sleep habits. Review good sleep hygiene tips.    Education: Stress and Anxiety: - Provides group verbal and written instruction about the health risks of elevated stress and causes of high stress.  Discuss the correlation between heart/lung disease and anxiety and treatment options. Review healthy ways to manage with stress and anxiety.   Initial Review & Psychosocial Screening:  Initial Psych Review & Screening - 03/11/20 1122      Initial Review   Current issues with Current Stress Concerns    Source of Stress Concerns Family    Comments Husband diagnosed with stage 1 memory loss      Family Dynamics  Good Support System? Yes   Son and daughter in law.  husband of 26     Barriers   Psychosocial barriers to participate in program There are no identifiable barriers or psychosocial needs.;The patient should benefit from training in stress management and relaxation.      Screening Interventions   Interventions Encouraged to exercise;To provide support and resources with identified psychosocial needs;Provide feedback about the scores to participant    Expected Outcomes Short Term goal: Utilizing psychosocial counselor, staff and physician to assist with identification of specific Stressors or current issues interfering with healing process. Setting desired goal for each stressor or current issue identified.;Long Term Goal: Stressors or current issues are controlled or eliminated.;Short Term goal: Identification and review with participant of any Quality of Life or Depression concerns found by scoring the questionnaire.;Long Term goal: The participant improves quality of Life and PHQ9 Scores as seen by post scores and/or verbalization of changes           Quality of Life Scores:  Scores of 19 and below usually indicate a poorer quality of life in these  areas.  A difference of  2-3 points is a clinically meaningful difference.  A difference of 2-3 points in the total score of the Quality of Life Index has been associated with significant improvement in overall quality of life, self-image, physical symptoms, and general health in studies assessing change in quality of life.  PHQ-9: Recent Review Flowsheet Data    Depression screen [facility] 2/9 03/12/2020   Decreased Interest 1   Down, Depressed, Hopeless 0   PHQ - 2 Score 1   Altered sleeping 1   Tired, decreased energy 2   Change in appetite 0   Feeling bad or failure about yourself  0   Trouble concentrating 0   Moving slowly or fidgety/restless 0   Suicidal thoughts 0   PHQ-9 Score 4   Difficult doing work/chores Somewhat difficult     Interpretation of Total Score  Total Score Depression Severity:  1-4 = Minimal depression, 5-9 = Mild depression, 10-14 = Moderate depression, 15-19 = Moderately severe depression, 20-27 = Severe depression   Psychosocial Evaluation and Intervention:  Psychosocial Evaluation - 03/11/20 1132      Psychosocial Evaluation & Interventions   Interventions Encouraged to exercise with the program and follow exercise prescription    Comments Mackenzie Walsh is coming to the program without any major barriers. She is concerned about her husband of 22 years that has been recently diagnosed with Stage 1 Memory Loss. They have a support system with their son and duaghter in law that live in [place] and they live in a continous care community and have all the resources there for them to utilize. She hope to be able to breath better after completing the program.    Expected Outcomes STG: Mackenzie Walsh will attend her scheduled sessions. LTG: Mackenzie Walsh will continue to follow the exercise and eduction guidelines to maintain what she gained in the program.    Continue Psychosocial Services  Follow up required by staff           Psychosocial Re-Evaluation:   Psychosocial  Discharge (Final Psychosocial Re-Evaluation):   Education: Education Goals: Education classes will be provided on a weekly basis, covering required topics. Participant will state understanding/return demonstration of topics presented.  Learning Barriers/Preferences:  Learning Barriers/Preferences - 03/11/20 1125      Learning Barriers/Preferences   Learning Barriers None    Learning Preferences None  General Pulmonary Education Topics:  Infection Prevention: - Provides verbal and written material to individual with discussion of infection control including proper hand washing and proper equipment cleaning during exercise session.   Pulmonary Rehab from 03/12/2020 in [facility] Cardiac and Pulmonary Rehab  Date 03/12/20  Educator [facility]  Instruction Review Code 1- Verbalizes Understanding      Falls Prevention: - Provides verbal and written material to individual with discussion of falls prevention and safety.   Pulmonary Rehab from 03/12/2020 in [facility] Cardiac and Pulmonary Rehab  Date 03/11/20  Educator SB  Instruction Review Code 1- Verbalizes Understanding      Chronic Lung Diseases: - Group verbal and written instruction to review updates, respiratory medications, advancements in procedures and treatments. Discuss use of supplemental oxygen including available portable oxygen systems, continuous and intermittent flow rates, concentrators, personal use and safety guidelines. Review proper use of inhaler and spacers. Provide informative websites for self-education.    Pulmonary Rehab from 03/12/2020 in [facility] Cardiac and Pulmonary Rehab  Date 03/12/20  Instruction Review Code 3- Needs Reinforcement  [need identified]      Energy Conservation: - Provide group verbal and written instruction for methods to conserve energy, plan and organize activities. Instruct on pacing techniques, use of adaptive equipment and posture/positioning to relieve shortness of breath.   Triggers  and Exacerbations: - Group verbal and written instruction to review types of environmental triggers and ways to prevent exacerbations. Discuss weather changes, air quality and the benefits of nasal washing. Review warning signs and symptoms to help prevent infections. Discuss techniques for effective airway clearance, coughing, and vibrations.   AED/CPR: - Group verbal and written instruction with the use of models to demonstrate the basic use of the AED with the basic ABC's of resuscitation.   Anatomy and Physiology of the Lungs: - Group verbal and written instruction with the use of models to provide basic lung anatomy and physiology related to function, structure and complications of lung disease.   Anatomy & Physiology of the Heart: - Group verbal and written instruction and models provide basic cardiac anatomy and physiology, with the coronary electrical and arterial systems. Review of Valvular disease and Heart Failure   Cardiac Medications: - Group verbal and written instruction to review commonly prescribed medications for heart disease. Reviews the medication, class of the drug, and side effects.   Other: -Provides group and verbal instruction on various topics (see comments)   Knowledge Questionnaire Score:  Knowledge Questionnaire Score - 03/12/20 1126      Knowledge Questionnaire Score   Pre Score 17/18 Education Focus: O2 safety            Core Components/Risk Factors/Patient Goals at Admission:  Personal Goals and Risk Factors at Admission - 03/12/20 1128      Core Components/Risk Factors/Patient Goals on Admission    Weight Management Yes;Weight Maintenance    Intervention Weight Management: Develop a combined nutrition and exercise program designed to reach desired caloric intake, while maintaining appropriate intake of nutrient and fiber, sodium and fats, and appropriate energy expenditure required for the weight goal.;Weight Management: Provide education and  appropriate resources to help participant work on and attain dietary goals.    Admit Weight 133 lb (60.3 kg)    Goal Weight: Short Term 133 lb (60.3 kg)    Goal Weight: Long Term 130 lb (59 kg)    Expected Outcomes Short Term: Continue to assess and modify interventions until short term weight is achieved;Long Term: Adherence to nutrition  and physical activity/exercise program aimed toward attainment of established weight goal;Weight Maintenance: Understanding of the daily nutrition guidelines, which includes 25-35% calories from fat, 7% or less cal from saturated fats, less than 251m cholesterol, less than 1.5gm of sodium, & 5 or more servings of fruits and vegetables daily    Improve shortness of breath with ADL's Yes    Intervention Provide education, individualized exercise plan and daily activity instruction to help decrease symptoms of SOB with activities of daily living.    Expected Outcomes Short Term: Improve cardiorespiratory fitness to achieve a reduction of symptoms when performing ADLs;Long Term: Be able to perform more ADLs without symptoms or delay the onset of symptoms    Hypertension Yes    Intervention Provide education on lifestyle modifcations including regular physical activity/exercise, weight management, moderate sodium restriction and increased consumption of fresh fruit, vegetables, and low fat dairy, alcohol moderation, and smoking cessation.;Monitor prescription use compliance.    Expected Outcomes Short Term: Continued assessment and intervention until BP is < 140/978mHG in hypertensive participants. < 130/8036mG in hypertensive participants with diabetes, heart failure or chronic kidney disease.;Long Term: Maintenance of blood pressure at goal levels.    Lipids Yes    Intervention Provide education and support for participant on nutrition & aerobic/resistive exercise along with prescribed medications to achieve LDL <63m55mDL >40mg27m Expected Outcomes Short Term:  Participant states understanding of desired cholesterol values and is compliant with medications prescribed. Participant is following exercise prescription and nutrition guidelines.;Long Term: Cholesterol controlled with medications as prescribed, with individualized exercise RX and with personalized nutrition plan. Value goals: LDL < 63mg,20m > 40 mg.           Education:Diabetes - Individual verbal and written instruction to review signs/symptoms of diabetes, desired ranges of glucose level fasting, after meals and with exercise. Acknowledge that pre and post exercise glucose checks will be done for 3 sessions at entry of program.   Education: Know Your Numbers and Risk Factors: -Group verbal and written instruction about important numbers in your health.  Discussion of what are risk factors and how they play a role in the disease process.  Review of Cholesterol, Blood Pressure, Diabetes, and BMI and the role they play in your overall health.   Core Components/Risk Factors/Patient Goals Review:    Core Components/Risk Factors/Patient Goals at Discharge (Final Review):    ITP Comments:  ITP Comments    Row Name 03/11/20 1138 03/12/20 1118         ITP Comments Virtual orientation call completed today. shehas an appointment on Date: 03/12/2020 for EP eval and gym Orientation.  [facility] Completed 6MWT and gym orientation. Initial ITP created and sent for review to Dr. Mark MEmily Filbertcal Director.             Comments: Initial ITP

## 2020-03-12 NOTE — Patient Instructions (Signed)
Patient Instructions  Patient Details  Name: Mackenzie Walsh MRN: [id number] Date of Birth: 1931/07/31 Referring Provider:  Vida Rigger, MD  Below are your personal goals for exercise, nutrition, and risk factors. Our goal is to help you stay on track towards obtaining and maintaining these goals. We will be discussing your progress on these goals with you throughout the program.  Initial Exercise Prescription:  Initial Exercise Prescription - 03/12/20 1100      Date of Initial Exercise RX and Referring Provider   Date 03/12/20    Referring Provider Karna Christmas, Fraud MD      Treadmill   MPH 0.7    Grade 0    Minutes 15    METs 1.5      Recumbant Bike   Level 1    RPM 50    Watts 5    Minutes 15    METs 1.5      NuStep   Level 1    SPM 80    Minutes 15    METs 1.5      REL-XR   Level 1    Speed 50    Minutes 15    METs 1.5      Prescription Details   Frequency (times per week) 3    Duration Progress to 30 minutes of continuous aerobic without signs/symptoms of physical distress      Intensity   THRR 40-80% of Max Heartrate 102-123    Ratings of Perceived Exertion 11-13    Perceived Dyspnea 0-4      Progression   Progression Continue to progress workloads to maintain intensity without signs/symptoms of physical distress.      Resistance Training   Training Prescription Yes    Weight 2 lb    Reps 10-15           Exercise Goals: Frequency: Be able to perform aerobic exercise two to three times per week in program working toward 2-5 days per week of home exercise.  Intensity: Work with a perceived exertion of 11 (fairly light) - 15 (hard) while following your exercise prescription.  We will make changes to your prescription with you as you progress through the program.   Duration: Be able to do 30 to 45 minutes of continuous aerobic exercise in addition to a 5 minute warm-up and a 5 minute cool-down routine.   Nutrition Goals: Your personal  nutrition goals will be established when you do your nutrition analysis with the dietician.  The following are general nutrition guidelines to follow: Cholesterol < 200mg /day Sodium < 1500mg /day Fiber: Women over 50 yrs - 21 grams per day  Personal Goals:  Personal Goals and Risk Factors at Admission - 03/12/20 1128      Core Components/Risk Factors/Patient Goals on Admission    Weight Management Yes;Weight Maintenance    Intervention Weight Management: Develop a combined nutrition and exercise program designed to reach desired caloric intake, while maintaining appropriate intake of nutrient and fiber, sodium and fats, and appropriate energy expenditure required for the weight goal.;Weight Management: Provide education and appropriate resources to help participant work on and attain dietary goals.    Admit Weight 133 lb (60.3 kg)    Goal Weight: Short Term 133 lb (60.3 kg)    Goal Weight: Long Term 130 lb (59 kg)    Expected Outcomes Short Term: Continue to assess and modify interventions until short term weight is achieved;Long Term: Adherence to nutrition and physical activity/exercise program aimed toward attainment of  established weight goal;Weight Maintenance: Understanding of the daily nutrition guidelines, which includes 25-35% calories from fat, 7% or less cal from saturated fats, less than 200mg  cholesterol, less than 1.5gm of sodium, & 5 or more servings of fruits and vegetables daily    Improve shortness of breath with ADL's Yes    Intervention Provide education, individualized exercise plan and daily activity instruction to help decrease symptoms of SOB with activities of daily living.    Expected Outcomes Short Term: Improve cardiorespiratory fitness to achieve a reduction of symptoms when performing ADLs;Long Term: Be able to perform more ADLs without symptoms or delay the onset of symptoms    Hypertension Yes    Intervention Provide education on lifestyle modifcations including  regular physical activity/exercise, weight management, moderate sodium restriction and increased consumption of fresh fruit, vegetables, and low fat dairy, alcohol moderation, and smoking cessation.;Monitor prescription use compliance.    Expected Outcomes Short Term: Continued assessment and intervention until BP is < 140/13mm HG in hypertensive participants. < 130/64mm HG in hypertensive participants with diabetes, heart failure or chronic kidney disease.;Long Term: Maintenance of blood pressure at goal levels.    Lipids Yes    Intervention Provide education and support for participant on nutrition & aerobic/resistive exercise along with prescribed medications to achieve LDL 70mg , HDL >40mg .    Expected Outcomes Short Term: Participant states understanding of desired cholesterol values and is compliant with medications prescribed. Participant is following exercise prescription and nutrition guidelines.;Long Term: Cholesterol controlled with medications as prescribed, with individualized exercise RX and with personalized nutrition plan. Value goals: LDL < 70mg , HDL > 40 mg.           Tobacco Use Initial Evaluation: Social History   Tobacco Use  Smoking Status Never Smoker  Smokeless Tobacco Never Used    Exercise Goals and Review:  Exercise Goals    Row Name 03/12/20 1125             Exercise Goals   Increase Physical Activity Yes       Intervention Provide advice, education, support and counseling about physical activity/exercise needs.;Develop an individualized exercise prescription for aerobic and resistive training based on initial evaluation findings, risk stratification, comorbidities and participant's personal goals.       Expected Outcomes Short Term: Attend rehab on a regular basis to increase amount of physical activity.;Long Term: Add in home exercise to make exercise part of routine and to increase amount of physical activity.;Long Term: Exercising regularly at least 3-5  days a week.       Increase Strength and Stamina Yes       Intervention Provide advice, education, support and counseling about physical activity/exercise needs.;Develop an individualized exercise prescription for aerobic and resistive training based on initial evaluation findings, risk stratification, comorbidities and participant's personal goals.       Expected Outcomes Short Term: Increase workloads from initial exercise prescription for resistance, speed, and METs.;Long Term: Improve cardiorespiratory fitness, muscular endurance and strength as measured by increased METs and functional capacity ( );Short Term: Perform resistance training exercises routinely during rehab and add in resistance training at home       Able to understand and use rate of perceived exertion (RPE) scale Yes       Intervention Provide education and explanation on how to use RPE scale       Expected Outcomes Short Term: Able to use RPE daily in rehab to express subjective intensity level;Long Term:  Able to use RPE to guide  intensity level when exercising independently       Able to understand and use Dyspnea scale Yes       Intervention Provide education and explanation on how to use Dyspnea scale       Expected Outcomes Short Term: Able to use Dyspnea scale daily in rehab to express subjective sense of shortness of breath during exertion;Long Term: Able to use Dyspnea scale to guide intensity level when exercising independently       Knowledge and understanding of Target Heart Rate Range (THRR) Yes       Intervention Provide education and explanation of THRR including how the numbers were predicted and where they are located for reference       Expected Outcomes Short Term: Able to state/look up THRR;Short Term: Able to use daily as guideline for intensity in rehab;Long Term: Able to use THRR to govern intensity when exercising independently       Able to check pulse independently Yes       Intervention Provide  education and demonstration on how to check pulse in carotid and radial arteries.;Review the importance of being able to check your own pulse for safety during independent exercise       Expected Outcomes Short Term: Able to explain why pulse checking is important during independent exercise;Long Term: Able to check pulse independently and accurately       Understanding of Exercise Prescription Yes       Intervention Provide education, explanation, and written materials on patient's individual exercise prescription       Expected Outcomes Short Term: Able to explain program exercise prescription;Long Term: Able to explain home exercise prescription to exercise independently              Copy of goals given to participant.

## 2020-03-13 ENCOUNTER — Other Ambulatory Visit: Payer: Self-pay

## 2020-03-13 ENCOUNTER — Encounter: Payer: Medicare Other | Admitting: *Deleted

## 2020-03-13 DIAGNOSIS — J9611 Chronic respiratory failure with hypoxia: Secondary | ICD-10-CM

## 2020-03-13 NOTE — Progress Notes (Signed)
Daily Session Note  Patient Details  Name: Mackenzie Walsh MRN: [id number] Date of Birth: May 11, 1932 Referring Provider:     Pulmonary Rehab from 03/12/2020 in [facility] Cardiac and Pulmonary Rehab  Referring Provider Lanney Gins, Fraud MD      Encounter Date: 03/13/2020  Check In:  Session Check In - 03/13/20 1139      Check-In   Supervising physician immediately available to respond to emergencies See telemetry face sheet for immediately available ER MD    Location [facility]-Cardiac & Pulmonary Rehab    Staff Present Darel Hong, RN BSN;Joseph Foy [place], [place], ACSM CEP, Exercise Physiologist;[name] RDN, LDN    Virtual Visit No    Medication changes reported     No    Fall or balance concerns reported    No    Warm-up and Cool-down Performed on first and last piece of equipment    Resistance Training Performed Yes    VAD Patient? No    PAD/SET Patient? No      Pain Assessment   Currently in Pain? No/denies              Social History   Tobacco Use  Smoking Status Never Smoker  Smokeless Tobacco Never Used    Goals Met:  Proper associated with RPD/PD & O2 Sat Using PLB without cueing & demonstrates good technique Exercise tolerated well Queuing for purse lip breathing Strength training completed today  Goals Unmet:  Not Applicable  Comments: First full day of exercise!  Patient was oriented to gym and equipment including functions, settings, policies, and procedures.  Patient's individual exercise prescription and treatment plan were reviewed.  All starting workloads were established based on the results of the 6 minute walk test done at initial orientation visit.  The plan for exercise progression was also introduced and progression will be customized based on patient's performance and goals.    Dr. Emily Filbert is Medical Director for [place] and [facility].

## 2020-03-15 ENCOUNTER — Other Ambulatory Visit (INDEPENDENT_AMBULATORY_CARE_PROVIDER_SITE_OTHER): Payer: Self-pay

## 2020-03-15 ENCOUNTER — Encounter: Payer: Medicare Other | Admitting: *Deleted

## 2020-03-15 ENCOUNTER — Other Ambulatory Visit: Payer: Self-pay

## 2020-03-15 DIAGNOSIS — J9611 Chronic respiratory failure with hypoxia: Secondary | ICD-10-CM | POA: Diagnosis not present

## 2020-03-15 NOTE — Telephone Encounter (Signed)
Erroneous encounter

## 2020-03-15 NOTE — Progress Notes (Signed)
Daily Session Note  Patient Details  Name: Mackenzie Walsh MRN: [id number] Date of Birth: 1931/12/07 Referring Provider:     Pulmonary Rehab from 03/12/2020 in [facility] Cardiac and Pulmonary Rehab  Referring Provider Lanney Gins, Fraud MD      Encounter Date: 03/15/2020  Check In:  Session Check In - 03/15/20 1156      Check-In   Supervising physician immediately available to respond to emergencies See telemetry face sheet for immediately available ER MD    Location [facility]-Cardiac & Pulmonary Rehab    Staff Present [name], RN, BSN, CCRP;Melissa [place] RDN, LDN;Jessica Emet, MA, RCEP, CCRP, CCET    Virtual Visit No    Medication changes reported     No    Fall or balance concerns reported    No    Warm-up and Cool-down Performed on first and last piece of equipment    Resistance Training Performed Yes    VAD Patient? No    PAD/SET Patient? No      Pain Assessment   Currently in Pain? No/denies              Social History   Tobacco Use  Smoking Status Never Smoker  Smokeless Tobacco Never Used    Goals Met:  Exercise tolerated well No report of cardiac concerns or symptoms  Goals Unmet:  Not Applicable  Comments: Pt able to follow exercise prescription today without complaint.  Will continue to monitor for progression.    Dr. Emily Filbert is Medical Director for [place] and [facility].

## 2020-03-18 ENCOUNTER — Encounter: Payer: Medicare Other | Admitting: *Deleted

## 2020-03-18 ENCOUNTER — Other Ambulatory Visit: Payer: Self-pay

## 2020-03-18 DIAGNOSIS — J9611 Chronic respiratory failure with hypoxia: Secondary | ICD-10-CM

## 2020-03-18 NOTE — Progress Notes (Signed)
Completed initial RD evaluation 

## 2020-03-18 NOTE — Progress Notes (Signed)
Daily Session Note  Patient Details  Name: Mackenzie Walsh MRN: [id number] Date of Birth: October 03, 1931 Referring Provider:     Pulmonary Rehab from 03/12/2020 in [facility] Cardiac and Pulmonary Rehab  Referring Provider Lanney Gins, Fraud MD      Encounter Date: 03/18/2020  Check In:  Session Check In - 03/18/20 1102      Check-In   Supervising physician immediately available to respond to emergencies See telemetry face sheet for immediately available ER MD    Location [facility]-Cardiac & Pulmonary Rehab    Staff Present Renita Papa, RN BSN;Joseph Lou [place], [place] Exercise Physiologist;Kelly Amedeo Plenty, [place], ACSM CEP, Exercise Physiologist    Virtual Visit No    Medication changes reported     No    Fall or balance concerns reported    No    Warm-up and Cool-down Performed on first and last piece of equipment    Resistance Training Performed Yes    VAD Patient? No    PAD/SET Patient? No      Pain Assessment   Currently in Pain? No/denies              Social History   Tobacco Use  Smoking Status Never Smoker  Smokeless Tobacco Never Used    Goals Met:  Independence with exercise equipment Exercise tolerated well No report of cardiac concerns or symptoms Strength training completed today  Goals Unmet:  Not Applicable  Comments: Pt able to follow exercise prescription today without complaint.  Will continue to monitor for progression.    Dr. Emily Filbert is Medical Director for [place] and [facility].

## 2020-03-20 ENCOUNTER — Other Ambulatory Visit: Payer: Self-pay

## 2020-03-20 ENCOUNTER — Encounter: Payer: Medicare Other | Admitting: *Deleted

## 2020-03-20 DIAGNOSIS — J9611 Chronic respiratory failure with hypoxia: Secondary | ICD-10-CM | POA: Diagnosis not present

## 2020-03-20 NOTE — Progress Notes (Signed)
Daily Session Note  Patient Details  Name: Mackenzie Walsh MRN: [id number] Date of Birth: November 21, 1931 Referring Provider:     Pulmonary Rehab from 03/12/2020 in [facility] Cardiac and Pulmonary Rehab  Referring Provider Mackenzie Walsh, Fraud MD      Encounter Date: 03/20/2020  Check In:  Session Check In - 03/20/20 1257      Check-In   Supervising physician immediately available to respond to emergencies See telemetry face sheet for immediately available ER MD    Location [facility]-Cardiac & Pulmonary Rehab    Staff Present [name], RN, BSN, CCRP;[name], RN BSN;[name] RDN, [name], BA, ACSM CEP, Exercise Physiologist;[name], MS Exercise Physiologist    Virtual Visit No    Medication changes reported     No    Fall or balance concerns reported    No    Warm-up and Cool-down Performed on first and last piece of equipment    Resistance Training Performed Yes    VAD Patient? No    PAD/SET Patient? No      Pain Assessment   Currently in Pain? No/denies              Social History   Tobacco Use  Smoking Status Never Smoker  Smokeless Tobacco Never Used    Goals Met:  Proper associated with RPD/PD & O2 Sat Independence with exercise equipment Exercise tolerated well Personal goals reviewed No report of cardiac concerns or symptoms  Goals Unmet:  Not Applicable  Comments: Pt able to follow exercise prescription today without complaint.  Will continue to monitor for progression.

## 2020-03-22 ENCOUNTER — Other Ambulatory Visit: Payer: Self-pay

## 2020-03-22 ENCOUNTER — Encounter: Payer: Medicare Other | Attending: Pulmonary Disease | Admitting: *Deleted

## 2020-03-22 DIAGNOSIS — J9611 Chronic respiratory failure with hypoxia: Secondary | ICD-10-CM | POA: Insufficient documentation

## 2020-03-22 NOTE — Progress Notes (Signed)
Daily Session Note  Patient Details  Name: Mackenzie Walsh MRN: [id number] Date of Birth: 1931-07-01 Referring Provider:     Pulmonary Rehab from 03/12/2020 in [facility] Cardiac and Pulmonary Rehab  Referring Provider Lanney Gins, Fraud MD      Encounter Date: 03/22/2020  Check In:  Session Check In - 03/22/20 1125      Check-In   Supervising physician immediately available to respond to emergencies See telemetry face sheet for immediately available ER MD    Location [facility]-Cardiac & Pulmonary Rehab    Staff Present Renita Papa, RN BSN;Joseph Lou Miner, [place] Exercise Physiologist    Virtual Visit No    Medication changes reported     No    Fall or balance concerns reported    No    Warm-up and Cool-down Performed on first and last piece of equipment    Resistance Training Performed Yes    VAD Patient? No    PAD/SET Patient? No      Pain Assessment   Currently in Pain? No/denies              Social History   Tobacco Use  Smoking Status Never Smoker  Smokeless Tobacco Never Used    Goals Met:  Independence with exercise equipment Exercise tolerated well No report of cardiac concerns or symptoms Strength training completed today  Goals Unmet:  Not Applicable  Comments: Pt able to follow exercise prescription today without complaint.  Will continue to monitor for progression.    Dr. Emily Filbert is Medical Director for [place] and [facility].

## 2020-03-25 ENCOUNTER — Encounter: Payer: Medicare Other | Admitting: *Deleted

## 2020-03-25 ENCOUNTER — Other Ambulatory Visit: Payer: Self-pay

## 2020-03-25 DIAGNOSIS — J9611 Chronic respiratory failure with hypoxia: Secondary | ICD-10-CM

## 2020-03-25 NOTE — Progress Notes (Signed)
Daily Session Note  Patient Details  Name: CYNDAL KASSON MRN: [id number] Date of Birth: 1931/09/05 Referring Provider:     Pulmonary Rehab from 03/12/2020 in [facility] Cardiac and Pulmonary Rehab  Referring Provider Lanney Gins, Fraud MD      Encounter Date: 03/25/2020  Check In:  Session Check In - 03/25/20 1114      Check-In   Supervising physician immediately available to respond to emergencies See telemetry face sheet for immediately available ER MD    Location [facility]-Cardiac & Pulmonary Rehab    Staff Present Renita Papa, RN BSN;Joseph Lou Min[place], [place] Exercise Physiologist;Kelly Amedeo Pl[place], [place], ACSM CEP, Exercise Physiologist    Virtual Visit No    Medication changes reported     No    Fall or balance concerns reported    No    Warm-up and Cool-down Performed on first and last piece of equipment    Resistance Training Performed Yes    VAD Patient? No    PAD/SET Patient? No      Pain Assessment   Currently in Pain? No/denies              Social History   Tobacco Use  Smoking Status Never Smoker  Smokeless Tobacco Never Used    Goals Met:  Independence with exercise equipment Exercise tolerated well No report of cardiac concerns or symptoms Strength training completed today  Goals Unmet:  Not Applicable  Comments: Pt able to follow exercise prescription today without complaint.  Will continue to monitor for progression.    Dr. Emily Filbert is Medical Director for [place] and [facility].

## 2020-03-27 ENCOUNTER — Other Ambulatory Visit: Payer: Self-pay

## 2020-03-27 ENCOUNTER — Encounter: Payer: Medicare Other | Admitting: *Deleted

## 2020-03-27 ENCOUNTER — Encounter: Payer: Self-pay | Admitting: *Deleted

## 2020-03-27 DIAGNOSIS — J9611 Chronic respiratory failure with hypoxia: Secondary | ICD-10-CM

## 2020-03-27 NOTE — Progress Notes (Signed)
Pulmonary Individual Treatment Plan  Patient Details  Name: Mackenzie Walsh MRN: [id number] Date of Birth: Aug 19, 1931 Referring Provider:     Pulmonary Rehab from 03/12/2020 in [facility] Cardiac and Pulmonary Rehab  Referring Provider Lanney Gins, Fraud MD      Initial Encounter Date:    Pulmonary Rehab from 03/12/2020 in [facility] Cardiac and Pulmonary Rehab  Date 03/12/20      Visit Diagnosis: Chronic respiratory failure with hypoxia ([place])  Patient's Home Medications on Admission:  Current Outpatient Medications:  .  Acetaminophen 500 MG capsule, Take 2 capsules (1,000 mg total) by mouth 3 times/day as needed-between meals & bedtime., Disp: 30 capsule, Rfl:  .  albuterol (PROVENTIL) (2.5 MG/3ML) 0.083% nebulizer solution, Take 3 mLs (2.5 mg total) by nebulization every 4 (four) hours as needed for wheezing or shortness of breath., Disp: 75 mL, Rfl: 2 .  amiodarone (PACERONE) 200 MG tablet, Take 200 mg by mouth daily. , Disp: , Rfl:  .  bifidobacterium infantis (ALIGN) capsule, Take 1 capsule by mouth daily., Disp: , Rfl:  .  budesonide (PULMICORT) 0.5 MG/2ML nebulizer solution, Inhale 2 mLs into the lungs 2 (two) times daily. (Patient not taking: Reported on 03/11/2020), Disp: , Rfl:  .  CALCIUM CITRATE PO, Take 600 mg by mouth 2 (two) times daily as needed., Disp: , Rfl:  .  carvedilol (COREG) 12.5 MG tablet, Take 12.5 mg by mouth 2 (two) times daily with a meal. , Disp: , Rfl:  .  CITRACAL MAXIMUM 315-250 MG-UNIT TABS, , Disp: , Rfl:  .  Coenzyme Q10 (COQ-10) 100 MG CAPS, Take 100 mg by mouth daily. , Disp: , Rfl:  .  fluticasone (FLONASE) 50 MCG/ACT nasal spray, Place 1-2 sprays into both nostrils daily. , Disp: , Rfl:  .  furosemide (LASIX) 40 MG tablet, Take 20 mg by mouth daily., Disp: , Rfl:  .  gabapentin (NEURONTIN) 300 MG capsule, Take 300 mg by mouth 3 (three) times daily. , Disp: , Rfl:  .  levothyroxine (SYNTHROID) 112 MCG tablet, Take 112 mcg by mouth daily before breakfast. ,  Disp: , Rfl:  .  losartan (COZAAR) 25 MG tablet, Take 25 mg by mouth daily. , Disp: , Rfl:  .  Magnesium Oxide 200 MG TABS, Take 200 mg by mouth daily. , Disp: , Rfl:  .  Multiple Vitamin (MULTI-VITAMIN DAILY PO), Take 1 tablet by mouth daily.  (Patient not taking: Reported on 03/11/2020), Disp: , Rfl:  .  omeprazole (PRILOSEC) 40 MG capsule, Take 40 mg by mouth daily., Disp: , Rfl:  .  Rivaroxaban (XARELTO) 15 MG TABS tablet, Take 15 mg by mouth daily with supper. , Disp: , Rfl:  .  simvastatin (ZOCOR) 10 MG tablet, Take 10 mg by mouth at bedtime., Disp: , Rfl:  .  spironolactone (ALDACTONE) 25 MG tablet, Take 12.5 mg by mouth daily. , Disp: , Rfl:   Past Medical History: Past Medical History:  Diagnosis Date  . A-fib ([place])   . Allergy   . Anemia   . Arrhythmia   . Atrial fibrillation ([place])   . Broken femur ([place]) 2017  . Cataract   . Celiac disease   . Chronic pain syndrome   . Chronic systolic CHF (congestive heart failure) ([place])   . CKD (chronic kidney disease)   . COPD (chronic obstructive pulmonary disease) ([place])   . Dilated cardiomyopathy ([place])   . Hyperlipidemia   . Hypertension   . Migraines   . Mitral valve  regurgitation   . Osteoporosis   . Posterior rhinorrhea   . Scoliosis deformity of spine   . Senile hyperkeratosis   . Thyroid disease    hypothyroid  . Ventricular tachyarrhythmia (H[facility])     Tobacco Use: Social History   Tobacco Use  Smoking Status Never Smoker  Smokeless Tobacco Never Used    Labs: Recent Review Flowsheet Data   There is no flowsheet data to display.      Pulmonary Assessment Scores:  Pulmonary Assessment Scores    Row Name 03/12/20 1129         ADL UCSD   ADL Phase Entry     SOB Score total 61     Rest 0     Walk 3     Stairs 5     Bath 2     Dress 2     Shop 3       CAT Score   CAT Score 12       mMRC Score   mMRC Score 3            UCSD: Self-administered rating of dyspnea associated with activities of  daily living (ADLs) 6-point scale (0 = "not at all" to 5 = "maximal or unable to do because of breathlessness")  Scoring Scores range from 0 to 120.  Minimally important difference is 5 units  CAT: CAT can identify the health impairment of COPD patients and is better correlated with disease progression.  CAT has a scoring range of zero to 40. The CAT score is classified into four groups of low (less than 10), medium (10 - 20), high (21-30) and very high (31-40) based on the impact level of disease on health status. A CAT score over 10 suggests significant symptoms.  A worsening CAT score could be explained by an exacerbation, poor medication adherence, poor inhaler technique, or progression of COPD or comorbid conditions.  CAT MCID is 2 points  mMRC: mMRC (Modified Medical Research Council) Dyspnea Scale is used to assess the degree of baseline functional disability in patients of respiratory disease due to dyspnea. No minimal important difference is established. A decrease in score of 1 point or greater is considered a positive change.   Pulmonary Function Assessment:   Exercise Target Goals: Exercise Program Goal: Individual exercise prescription set using results from initial 6 min walk test and THRR while considering  patient's activity barriers and safety.   Exercise Prescription Goal: Initial exercise prescription builds to 30-45 minutes a day of aerobic activity, 2-3 days per week.  Home exercise guidelines will be given to patient during program as part of exercise prescription that the participant will acknowledge.  Education: Aerobic Exercise & Resistance Training: - Gives group verbal and written instruction on the various components of exercise. Focuses on aerobic and resistive training programs and the benefits of this training and how to safely progress through these programs..   Education: Exercise & Equipment Safety: - Individual verbal instruction and demonstration of  equipment use and safety with use of the equipment.   Pulmonary Rehab from 03/20/2020 in [facility] Cardiac and Pulmonary Rehab  Date 03/12/20  Educator [facility]  Instruction Review Code 1- Verbalizes Understanding      Education: Exercise Physiology & General Exercise Guidelines: - Group verbal and written instruction with models to review the exercise physiology of the cardiovascular system and associated critical values. Provides general exercise guidelines with specific guidelines to those with heart or lung disease.    Education:  Flexibility, Balance, Mind/Body Relaxation: Provides group verbal/written instruction on the benefits of flexibility and balance training, including mind/body exercise modes such as yoga, pilates and tai chi.  Demonstration and skill practice provided.   Activity Barriers & Risk Stratification:  Activity Barriers & Cardiac Risk Stratification - 03/12/20 1122      Activity Barriers & Cardiac Risk Stratification   Activity Barriers Right Knee Replacement;Other (comment);Neck/Spine Problems;Shortness of Breath;Deconditioning;Muscular Weakness;Back Problems;Balance Concerns;Assistive Device;History of Falls    Comments Rod in femur, scoliosis, spinal stenosis, marginal vision in left eye, limited ROM from ICD, kyphosis, scoliosis           6 Minute Walk:  6 Minute Walk    Row Name 03/12/20 1120         6 Minute Walk   Phase Initial     Distance 410 feet     Walk Time 4.32 minutes     # of Rest Breaks 3  7 sec, 50 sec, 32 sec     MPH 1.08     METS 0.52     RPE 17     Perceived Dyspnea  4     VO2 Peak 1.82     Symptoms Yes (comment)     Comments SOB     Resting HR 81 bpm     Resting BP 130/64     Resting Oxygen Saturation  94 %     Exercise Oxygen Saturation  during 6 min walk 89 %     Max Ex. HR 95 bpm     Max Ex. BP 134/66     2 Minute Post BP 128/64       Interval HR   1 Minute HR 95     2 Minute HR 90     3 Minute HR 85     4 Minute HR 89      5 Minute HR 83     6 Minute HR 94     2 Minute Post HR 80     Interval Heart Rate? Yes       Interval Oxygen   Interval Oxygen? Yes     Baseline Oxygen Saturation % 94 %     1 Minute Oxygen Saturation % 92 %     1 Minute Liters of Oxygen 0 L  Room Air     2 Minute Oxygen Saturation % 89 %     2 Minute Liters of Oxygen 0 L     3 Minute Oxygen Saturation % 92 %     3 Minute Liters of Oxygen 0 L     4 Minute Oxygen Saturation % 90 %     4 Minute Liters of Oxygen 0 L     5 Minute Oxygen Saturation % 91 %     5 Minute Liters of Oxygen 0 L     6 Minute Oxygen Saturation % 89 %     6 Minute Liters of Oxygen 0 L     2 Minute Post Oxygen Saturation % 94 %     2 Minute Post Liters of Oxygen 0 L           Oxygen Initial Assessment:  Oxygen Initial Assessment - 03/12/20 1129      Home Oxygen   Home Oxygen Device Home Concentrator;E-Tanks    Sleep Oxygen Prescription Continuous    Liters per minute 2    Home Exercise Oxygen Prescription None    Home Resting Oxygen Prescription None  Compliance with Home Oxygen Use Yes      Initial 6 min Walk   Oxygen Used None      Program Oxygen Prescription   Program Oxygen Prescription None      Intervention   Short Term Goals To learn and exhibit compliance with exercise, home and travel O2 prescription;To learn and understand importance of monitoring SPO2 with pulse oximeter and demonstrate accurate use of the pulse oximeter.;To learn and understand importance of maintaining oxygen saturations>88%;To learn and demonstrate proper pursed lip breathing techniques or other breathing techniques.;To learn and demonstrate proper use of respiratory medications    Long  Term Goals Exhibits compliance with exercise, home and travel O2 prescription;Verbalizes importance of monitoring SPO2 with pulse oximeter and return demonstration;Maintenance of O2 saturations>88%;Exhibits proper breathing techniques, such as pursed lip breathing or other method  taught during program session;Compliance with respiratory medication;Demonstrates proper use of MDI's           Oxygen Re-Evaluation:   Oxygen Discharge (Final Oxygen Re-Evaluation):   Initial Exercise Prescription:  Initial Exercise Prescription - 03/12/20 1100      Date of Initial Exercise RX and Referring Provider   Date 03/12/20    Referring Provider Lanney Gins, Fraud MD      Treadmill   MPH 0.7    Grade 0    Minutes 15    METs 1.5      Recumbant Bike   Level 1    RPM 50    Watts 5    Minutes 15    METs 1.5      NuStep   Level 1    SPM 80    Minutes 15    METs 1.5      REL-XR   Level 1    Speed 50    Minutes 15    METs 1.5      Prescription Details   Frequency (times per week) 3    Duration Progress to 30 minutes of continuous aerobic without signs/symptoms of physical distress      Intensity   THRR 40-80% of Max Heartrate 102-123    Ratings of Perceived Exertion 11-13    Perceived Dyspnea 0-4      Progression   Progression Continue to progress workloads to maintain intensity without signs/symptoms of physical distress.      Resistance Training   Training Prescription Yes    Weight 2 lb    Reps 10-15           Perform Capillary Blood Glucose checks as needed.  Exercise Prescription Changes:  Exercise Prescription Changes    Row Name 03/12/20 1100 03/13/20 1600           Response to Exercise   Blood Pressure (Admit) 130/64 84/58      Blood Pressure (Exercise) 134/66 102/78      Blood Pressure (Exit) 128/64 102/58      Heart Rate (Admit) 81 bpm 80 bpm      Heart Rate (Exercise) 95 bpm 58 bpm      Heart Rate (Exit) 79 bpm 80 bpm      Oxygen Saturation (Admit) 94 % 93 %      Oxygen Saturation (Exercise) 89 % 84 %      Oxygen Saturation (Exit) 94 % 92 %      Rating of Perceived Exertion (Exercise) 17 11      Perceived Dyspnea (Exercise) 4 --      Symptoms SOB --  Comments walk test results first day      Duration -- Progress to  30 minutes of  aerobic without signs/symptoms of physical distress      Intensity -- THRR unchanged        Progression   Progression -- Continue to progress workloads to maintain intensity without signs/symptoms of physical distress.      Average METs -- 1.2        Resistance Training   Training Prescription -- Yes      Weight -- 2 lb      Reps -- 10-15        NuStep   Level -- 1      SPM -- 80      Minutes -- 15      METs -- 1.2             Exercise Comments:   Exercise Goals and Review:  Exercise Goals    Row Name 03/12/20 1125             Exercise Goals   Increase Physical Activity Yes       Intervention Provide advice, education, support and counseling about physical activity/exercise needs.;Develop an individualized exercise prescription for aerobic and resistive training based on initial evaluation findings, risk stratification, comorbidities and participant's personal goals.       Expected Outcomes Short Term: Attend rehab on a regular basis to increase amount of physical activity.;Long Term: Add in home exercise to make exercise part of routine and to increase amount of physical activity.;Long Term: Exercising regularly at least 3-5 days a week.       Increase Strength and Stamina Yes       Intervention Provide advice, education, support and counseling about physical activity/exercise needs.;Develop an individualized exercise prescription for aerobic and resistive training based on initial evaluation findings, risk stratification, comorbidities and participant's personal goals.       Expected Outcomes Short Term: Increase workloads from initial exercise prescription for resistance, speed, and METs.;Long Term: Improve cardiorespiratory fitness, muscular endurance and strength as measured by increased METs and functional capacity (6MWT);Short Term: Perform resistance training exercises routinely during rehab and add in resistance training at home       Able to understand  and use rate of perceived exertion (RPE) scale Yes       Intervention Provide education and explanation on how to use RPE scale       Expected Outcomes Short Term: Able to use RPE daily in rehab to express subjective intensity level;Long Term:  Able to use RPE to guide intensity level when exercising independently       Able to understand and use Dyspnea scale Yes       Intervention Provide education and explanation on how to use Dyspnea scale       Expected Outcomes Short Term: Able to use Dyspnea scale daily in rehab to express subjective sense of shortness of breath during exertion;Long Term: Able to use Dyspnea scale to guide intensity level when exercising independently       Knowledge and understanding of Target Heart Rate Range (THRR) Yes       Intervention Provide education and explanation of THRR including how the numbers were predicted and where they are located for reference       Expected Outcomes Short Term: Able to state/look up THRR;Short Term: Able to use daily as guideline for intensity in rehab;Long Term: Able to use THRR to govern intensity when exercising independently  Able to check pulse independently Yes       Intervention Provide education and demonstration on how to check pulse in carotid and radial arteries.;Review the importance of being able to check your own pulse for safety during independent exercise       Expected Outcomes Short Term: Able to explain why pulse checking is important during independent exercise;Long Term: Able to check pulse independently and accurately       Understanding of Exercise Prescription Yes       Intervention Provide education, explanation, and written materials on patient's individual exercise prescription       Expected Outcomes Short Term: Able to explain program exercise prescription;Long Term: Able to explain home exercise prescription to exercise independently              Exercise Goals Re-Evaluation :  Exercise Goals  Re-Evaluation    Row Name 03/13/20 1140             Exercise Goal Re-Evaluation   Exercise Goals Review Increase Physical Activity;Able to understand and use rate of perceived exertion (RPE) scale;Understanding of Exercise Prescription;Knowledge and understanding of Target Heart Rate Range (THRR);Increase Strength and Stamina;Able to understand and use Dyspnea scale       Comments Reviewed RPE and dyspnea scales, THR and program prescription with pt today.  Pt voiced understanding and was given a copy of goals to take home.       Expected Outcomes Short: Use RPE daily to regulate intensity. Long: Follow program prescription in THR.              Discharge Exercise Prescription (Final Exercise Prescription Changes):  Exercise Prescription Changes - 03/13/20 1600      Response to Exercise   Blood Pressure (Admit) 84/58    Blood Pressure (Exercise) 102/78    Blood Pressure (Exit) 102/58    Heart Rate (Admit) 80 bpm    Heart Rate (Exercise) 58 bpm    Heart Rate (Exit) 80 bpm    Oxygen Saturation (Admit) 93 %    Oxygen Saturation (Exercise) 84 %    Oxygen Saturation (Exit) 92 %    Rating of Perceived Exertion (Exercise) 11    Comments first day    Duration Progress to 30 minutes of  aerobic without signs/symptoms of physical distress    Intensity THRR unchanged      Progression   Progression Continue to progress workloads to maintain intensity without signs/symptoms of physical distress.    Average METs 1.2      Resistance Training   Training Prescription Yes    Weight 2 lb    Reps 10-15      NuStep   Level 1    SPM 80    Minutes 15    METs 1.2           Nutrition:  Target Goals: Understanding of nutrition guidelines, daily intake of sodium '1500mg'$ , cholesterol '200mg'$ , calories 30% from fat and 7% or less from saturated fats, daily to have 5 or more servings of fruits and vegetables.  Education: Controlling Sodium/Reading Food Labels -Group verbal and written  material supporting the discussion of sodium use in heart healthy nutrition. Review and explanation with models, verbal and written materials for utilization of the food label.   Education: General Nutrition Guidelines/Fats and Fiber: -Group instruction provided by verbal, written material, models and posters to present the general guidelines for heart healthy nutrition. Gives an explanation and review of dietary fats and fiber.  Biometrics:  Pre Biometrics - 03/12/20 1126      Pre Biometrics   Height 5' 2.2" (1.58 m)    Weight 133 lb (60.3 kg)    BMI (Calculated) 24.17    Single Leg Stand 1.3 seconds            Nutrition Therapy Plan and Nutrition Goals:  Nutrition Therapy & Goals - 03/18/20 0916      Nutrition Therapy   Diet Pulmonary MNT    Protein (specify units) 75g    Fiber 25 grams    Whole Grain Foods 3 servings    Saturated Fats 12 max. grams    Fruits and Vegetables 5 servings/day    Sodium 1.5 grams      Personal Nutrition Goals   Nutrition Goal ST: Add protein food to breakfast consistently - egg or protein shake for example, eat more moist foods to help with swallowing LT: improve SOB    Comments No problems breathing during or after eating. Reports having trouble swallowing. Doesn't like spicy food. Appetite is somewhat lower than it has been. B: blueberries with dry cereal and milk w/ oj or an egg with toast and sausage with a banana every morning. L: Toasted sandwich with ham and cheese, slice of cheese with [place] breast and tomato with a side salad. sometimes lunch from [employer] at [place]. S: fruit with some yogurt. D: dining hall - meat, starch, vegetables with salad. Drinks: OJ and decaf coffee, water and apple juice, decaf tea. Pt uses gluten free bread. Pt reports she has been losing some weight 138-133 in last few months. Discussed pulmonary medical nutrition therapy. Pt reported not not wanting to change much of what she is eating, is willing to add  in more protein to breakfast.      Intervention Plan   Intervention Prescribe, educate and counsel regarding individualized specific dietary modifications aiming towards targeted core components such as weight, hypertension, lipid management, diabetes, heart failure and other comorbidities.;Nutrition handout(s) given to patient.    Expected Outcomes Short Term Goal: Understand basic principles of dietary content, such as calories, fat, sodium, cholesterol and nutrients.;Short Term Goal: A plan has been developed with personal nutrition goals set during dietitian appointment.;Long Term Goal: Adherence to prescribed nutrition plan.           Nutrition Assessments:  Nutrition Assessments - 03/12/20 1126      MEDFICTS Scores   Pre Score 67           MEDIFICTS Score Key:          ?70 Need to make dietary changes          40-70 Heart Healthy Diet         ? 40 Therapeutic Level Cholesterol Diet  Nutrition Goals Re-Evaluation:   Nutrition Goals Discharge (Final Nutrition Goals Re-Evaluation):   Psychosocial: Target Goals: Acknowledge presence or absence of significant depression and/or stress, maximize coping skills, provide positive support system. Participant is able to verbalize types and ability to use techniques and skills needed for reducing stress and depression.   Education: Depression - Provides group verbal and written instruction on the correlation between heart/lung disease and depressed mood, treatment options, and the stigmas associated with seeking treatment.   Education: Sleep Hygiene -Provides group verbal and written instruction about how sleep can affect your health.  Define sleep hygiene, discuss sleep cycles and impact of sleep habits. Review good sleep hygiene tips.    Education: Stress and Anxiety: - Provides  group verbal and written instruction about the health risks of elevated stress and causes of high stress.  Discuss the correlation between heart/lung  disease and anxiety and treatment options. Review healthy ways to manage with stress and anxiety.   Initial Review & Psychosocial Screening:  Initial Psych Review & Screening - 03/11/20 1122      Initial Review   Current issues with Current Stress Concerns    Source of Stress Concerns Family    Comments Husband diagnosed with stage 1 memory loss      Family Dynamics   Good Support System? Yes   Son and daughter in law.  husband of 30     Barriers   Psychosocial barriers to participate in program There are no identifiable barriers or psychosocial needs.;The patient should benefit from training in stress management and relaxation.      Screening Interventions   Interventions Encouraged to exercise;To provide support and resources with identified psychosocial needs;Provide feedback about the scores to participant    Expected Outcomes Short Term goal: Utilizing psychosocial counselor, staff and physician to assist with identification of specific Stressors or current issues interfering with healing process. Setting desired goal for each stressor or current issue identified.;Long Term Goal: Stressors or current issues are controlled or eliminated.;Short Term goal: Identification and review with participant of any Quality of Life or Depression concerns found by scoring the questionnaire.;Long Term goal: The participant improves quality of Life and PHQ9 Scores as seen by post scores and/or verbalization of changes           Quality of Life Scores:  Scores of 19 and below usually indicate a poorer quality of life in these areas.  A difference of  2-3 points is a clinically meaningful difference.  A difference of 2-3 points in the total score of the Quality of Life Index has been associated with significant improvement in overall quality of life, self-image, physical symptoms, and general health in studies assessing change in quality of life.  PHQ-9: Recent Review Flowsheet Data    Depression  screen [facility] 2/9 03/12/2020   Decreased Interest 1   Down, Depressed, Hopeless 0   PHQ - 2 Score 1   Altered sleeping 1   Tired, decreased energy 2   Change in appetite 0   Feeling bad or failure about yourself  0   Trouble concentrating 0   Moving slowly or fidgety/restless 0   Suicidal thoughts 0   PHQ-9 Score 4   Difficult doing work/chores Somewhat difficult     Interpretation of Total Score  Total Score Depression Severity:  1-4 = Minimal depression, 5-9 = Mild depression, 10-14 = Moderate depression, 15-19 = Moderately severe depression, 20-27 = Severe depression   Psychosocial Evaluation and Intervention:  Psychosocial Evaluation - 03/11/20 1132      Psychosocial Evaluation & Interventions   Interventions Encouraged to exercise with the program and follow exercise prescription    Comments Proscilla is coming to the program without any major barriers. She is concerned about her husband of 11 years that has been recently diagnosed with Stage 1 Memory Loss. They have a support system with their son and duaghter in law that live in [place] and they live in a continous care community and have all the resources there for them to utilize. She hope to be able to breath better after completing the program.    Expected Outcomes STG: Taira will attend her scheduled sessions. LTG: Blasa will continue to follow the exercise  and eduction guidelines to maintain what she gained in the program.    Continue Psychosocial Services  Follow up required by staff           Psychosocial Re-Evaluation:   Psychosocial Discharge (Final Psychosocial Re-Evaluation):   Education: Education Goals: Education classes will be provided on a weekly basis, covering required topics. Participant will state understanding/return demonstration of topics presented.  Learning Barriers/Preferences:  Learning Barriers/Preferences - 03/11/20 1125      Learning Barriers/Preferences   Learning Barriers None     Learning Preferences None           General Pulmonary Education Topics:  Infection Prevention: - Provides verbal and written material to individual with discussion of infection control including proper hand washing and proper equipment cleaning during exercise session.   Pulmonary Rehab from 03/20/2020 in [facility] Cardiac and Pulmonary Rehab  Date 03/12/20  Educator [facility]  Instruction Review Code 1- Verbalizes Understanding      Falls Prevention: - Provides verbal and written material to individual with discussion of falls prevention and safety.   Pulmonary Rehab from 03/20/2020 in [facility] Cardiac and Pulmonary Rehab  Date 03/11/20  Educator SB  Instruction Review Code 1- Verbalizes Understanding      Chronic Lung Diseases: - Group verbal and written instruction to review updates, respiratory medications, advancements in procedures and treatments. Discuss use of supplemental oxygen including available portable oxygen systems, continuous and intermittent flow rates, concentrators, personal use and safety guidelines. Review proper use of inhaler and spacers. Provide informative websites for self-education.    Pulmonary Rehab from 03/20/2020 in [facility] Cardiac and Pulmonary Rehab  Date 03/12/20  Instruction Review Code 3- Needs Reinforcement  [need identified]      Energy Conservation: - Provide group verbal and written instruction for methods to conserve energy, plan and organize activities. Instruct on pacing techniques, use of adaptive equipment and posture/positioning to relieve shortness of breath.   Pulmonary Rehab from 03/20/2020 in [facility] Cardiac and Pulmonary Rehab  Date 03/20/20  Educator [facility]  Instruction Review Code 1- Verbalizes Understanding      Triggers and Exacerbations: - Group verbal and written instruction to review types of environmental triggers and ways to prevent exacerbations. Discuss weather changes, air quality and the benefits of nasal washing. Review warning signs  and symptoms to help prevent infections. Discuss techniques for effective airway clearance, coughing, and vibrations.   Pulmonary Rehab from 03/20/2020 in [facility] Cardiac and Pulmonary Rehab  Date 03/20/20  Educator [facility]  Instruction Review Code 1- Verbalizes Understanding      AED/CPR: - Group verbal and written instruction with the use of models to demonstrate the basic use of the AED with the basic ABC's of resuscitation.   Anatomy and Physiology of the Lungs: - Group verbal and written instruction with the use of models to provide basic lung anatomy and physiology related to function, structure and complications of lung disease.   Pulmonary Rehab from 03/20/2020 in [facility] Cardiac and Pulmonary Rehab  Date 03/20/20  Educator [facility]  Instruction Review Code 1- Verbalizes Understanding      Anatomy & Physiology of the Heart: - Group verbal and written instruction and models provide basic cardiac anatomy and physiology, with the coronary electrical and arterial systems. Review of Valvular disease and Heart Failure   Cardiac Medications: - Group verbal and written instruction to review commonly prescribed medications for heart disease. Reviews the medication, class of the drug, and side effects.   Other: -Provides group and verbal instruction  on various topics (see comments)   Knowledge Questionnaire Score:  Knowledge Questionnaire Score - 03/12/20 1126      Knowledge Questionnaire Score   Pre Score 17/18 Education Focus: O2 safety            Core Components/Risk Factors/Patient Goals at Admission:  Personal Goals and Risk Factors at Admission - 03/12/20 1128      Core Components/Risk Factors/Patient Goals on Admission    Weight Management Yes;Weight Maintenance    Intervention Weight Management: Develop a combined nutrition and exercise program designed to reach desired caloric intake, while maintaining appropriate intake of nutrient and fiber, sodium and fats, and appropriate  energy expenditure required for the weight goal.;Weight Management: Provide education and appropriate resources to help participant work on and attain dietary goals.    Admit Weight 133 lb (60.3 kg)    Goal Weight: Short Term 133 lb (60.3 kg)    Goal Weight: Long Term 130 lb (59 kg)    Expected Outcomes Short Term: Continue to assess and modify interventions until short term weight is achieved;Long Term: Adherence to nutrition and physical activity/exercise program aimed toward attainment of established weight goal;Weight Maintenance: Understanding of the daily nutrition guidelines, which includes 25-35% calories from fat, 7% or less cal from saturated fats, less than $RemoveB'200mg'UVdeMnok$  cholesterol, less than 1.5gm of sodium, & 5 or more servings of fruits and vegetables daily    Improve shortness of breath with ADL's Yes    Intervention Provide education, individualized exercise plan and daily activity instruction to help decrease symptoms of SOB with activities of daily living.    Expected Outcomes Short Term: Improve cardiorespiratory fitness to achieve a reduction of symptoms when performing ADLs;Long Term: Be able to perform more ADLs without symptoms or delay the onset of symptoms    Hypertension Yes    Intervention Provide education on lifestyle modifcations including regular physical activity/exercise, weight management, moderate sodium restriction and increased consumption of fresh fruit, vegetables, and low fat dairy, alcohol moderation, and smoking cessation.;Monitor prescription use compliance.    Expected Outcomes Short Term: Continued assessment and intervention until BP is < 140/36mm HG in hypertensive participants. < 130/30mm HG in hypertensive participants with diabetes, heart failure or chronic kidney disease.;Long Term: Maintenance of blood pressure at goal levels.    Lipids Yes    Intervention Provide education and support for participant on nutrition & aerobic/resistive exercise along with  prescribed medications to achieve LDL '70mg'$ , HDL >$Remo'40mg'jRkXL$ .    Expected Outcomes Short Term: Participant states understanding of desired cholesterol values and is compliant with medications prescribed. Participant is following exercise prescription and nutrition guidelines.;Long Term: Cholesterol controlled with medications as prescribed, with individualized exercise RX and with personalized nutrition plan. Value goals: LDL < $Rem'70mg'uNPK$ , HDL > 40 mg.           Education:Diabetes - Individual verbal and written instruction to review signs/symptoms of diabetes, desired ranges of glucose level fasting, after meals and with exercise. Acknowledge that pre and post exercise glucose checks will be done for 3 sessions at entry of program.   Education: Know Your Numbers and Risk Factors: -Group verbal and written instruction about important numbers in your health.  Discussion of what are risk factors and how they play a role in the disease process.  Review of Cholesterol, Blood Pressure, Diabetes, and BMI and the role they play in your overall health.   Pulmonary Rehab from 03/20/2020 in [facility] Cardiac and Pulmonary Rehab  Date 03/13/20  Educator [facility]  Instruction Review Code 1- Verbalizes Understanding      Core Components/Risk Factors/Patient Goals Review:    Core Components/Risk Factors/Patient Goals at Discharge (Final Review):    ITP Comments:  ITP Comments    Row Name 03/11/20 1138 03/12/20 1118 03/13/20 1140 03/18/20 0933 03/27/20 0522   ITP Comments Virtual orientation call completed today. shehas an appointment on Date: 03/12/2020 for EP eval and gym Orientation.  Documentation of diagnosis can be found in [facility] Date: 01/30/2020. Completed 6MWT and gym orientation. Initial ITP created and sent for review to Dr. Emily Filbert, Medical Director. First full day of exercise!  Patient was oriented to gym and equipment including functions, settings, policies, and procedures.  Patient's individual exercise  prescription and treatment plan were reviewed.  All starting workloads were established based on the results of the 6 minute walk test done at initial orientation visit.  The plan for exercise progression was also introduced and progression will be customized based on patient's performance and goals. Completed Initial RD evaluation 30 Day review completed. Medical Director ITP review done, changes made as directed, and signed approval by Medical Director.          Comments:

## 2020-03-27 NOTE — Progress Notes (Signed)
Daily Session Note  Patient Details  Name: Mackenzie Walsh MRN: [id number] Date of Birth: 03-26-1932 Referring Provider:     Pulmonary Rehab from 03/12/2020 in [facility] Cardiac and Pulmonary Rehab  Referring Provider Lanney Gins, Fraud MD      Encounter Date: 03/27/2020  Check In:  Session Check In - 03/27/20 0948      Check-In   Supervising physician immediately available to respond to emergencies See telemetry face sheet for immediately available ER MD    Location [facility]-Cardiac & Pulmonary Rehab    Staff Present Renita Papa, RN BSN;Joseph [address] [place], [place], RCEP, CCRP, CCET;Melissa [place] RDN, LDN    Virtual Visit No    Medication changes reported     No    Fall or balance concerns reported    No    Warm-up and Cool-down Performed on first and last piece of equipment    Resistance Training Performed Yes    VAD Patient? No    PAD/SET Patient? No      Pain Assessment   Currently in Pain? No/denies              Social History   Tobacco Use  Smoking Status Never Smoker  Smokeless Tobacco Never Used    Goals Met:  Independence with exercise equipment Exercise tolerated well No report of cardiac concerns or symptoms Strength training completed today  Goals Unmet:  Not Applicable  Comments: Pt able to follow exercise prescription today without complaint.  Will continue to monitor for progression.    Dr. Emily Filbert is Medical Director for [place] and [facility].

## 2020-03-29 ENCOUNTER — Other Ambulatory Visit: Payer: Self-pay

## 2020-03-29 DIAGNOSIS — J9611 Chronic respiratory failure with hypoxia: Secondary | ICD-10-CM | POA: Diagnosis not present

## 2020-03-29 NOTE — Progress Notes (Signed)
Daily Session Note  Patient Details  Name: TAREN DYMEK MRN: [id number] Date of Birth: May 17, 1932 Referring Provider:     Pulmonary Rehab from 03/12/2020 in [facility] Cardiac and Pulmonary Rehab  Referring Provider Lanney Gins, Fraud MD      Encounter Date: 03/29/2020  Check In:  Session Check In - 03/29/20 0957      Check-In   Supervising physician immediately available to respond to emergencies See telemetry face sheet for immediately available ER MD    Location [facility]-Cardiac & Pulmonary Rehab    Staff Present Justin Mend RCP,RRT,BSRT;Vida Rigger RN, BSN;[name] RDN, LDN    Virtual Visit No    Medication changes reported     No    Fall or balance concerns reported    No    Warm-up and Cool-down Performed on first and last piece of equipment    Resistance Training Performed Yes    VAD Patient? No    PAD/SET Patient? No      Pain Assessment   Currently in Pain? No/denies              Social History   Tobacco Use  Smoking Status Never Smoker  Smokeless Tobacco Never Used    Goals Met:  Proper associated with RPD/PD & O2 Sat Independence with exercise equipment Exercise tolerated well No report of cardiac concerns or symptoms Strength training completed today  Goals Unmet:  Not Applicable  Comments: Pt able to follow exercise prescription today without complaint.  Will continue to monitor for progression.   Dr. Emily Filbert is Medical Director for [place] and [facility].

## 2020-04-01 ENCOUNTER — Encounter: Payer: Medicare Other | Admitting: *Deleted

## 2020-04-01 ENCOUNTER — Other Ambulatory Visit: Payer: Self-pay

## 2020-04-01 DIAGNOSIS — J9611 Chronic respiratory failure with hypoxia: Secondary | ICD-10-CM | POA: Diagnosis not present

## 2020-04-01 NOTE — Progress Notes (Signed)
Daily Session Note  Patient Details  Name: Mackenzie Walsh MRN: [id number] Date of Birth: 1932/05/15 Referring Provider:     Pulmonary Rehab from 03/12/2020 in [facility] Cardiac and Pulmonary Rehab  Referring Provider Lanney Gins, Fraud MD      Encounter Date: 04/01/2020  Check In:  Session Check In - 04/01/20 1035      Check-In   Supervising physician immediately available to respond to emergencies See telemetry face sheet for immediately available ER MD    Location [facility]-Cardiac & Pulmonary Rehab    Staff Present Renita Papa, RN BSN;Joseph [address] [place], [place], ACSM CEP, Exercise Physiologist    Virtual Visit No    Medication changes reported     No    Fall or balance concerns reported    No    Warm-up and Cool-down Performed on first and last piece of equipment    Resistance Training Performed Yes    VAD Patient? No    PAD/SET Patient? No      Pain Assessment   Currently in Pain? No/denies              Social History   Tobacco Use  Smoking Status Never Smoker  Smokeless Tobacco Never Used    Goals Met:  Independence with exercise equipment Exercise tolerated well No report of cardiac concerns or symptoms Strength training completed today  Goals Unmet:  Not Applicable  Comments: Pt able to follow exercise prescription today without complaint.  Will continue to monitor for progression.    Dr. Emily Filbert is Medical Director for [place] and [facility].

## 2020-04-03 ENCOUNTER — Encounter: Payer: Medicare Other | Admitting: *Deleted

## 2020-04-03 ENCOUNTER — Other Ambulatory Visit: Payer: Self-pay

## 2020-04-03 DIAGNOSIS — J9611 Chronic respiratory failure with hypoxia: Secondary | ICD-10-CM

## 2020-04-03 NOTE — Progress Notes (Signed)
Daily Session Note  Patient Details  Name: Mackenzie Walsh MRN: [id number] Date of Birth: 1931-08-10 Referring Provider:     Pulmonary Rehab from 03/12/2020 in [facility] Cardiac and Pulmonary Rehab  Referring Provider Lanney Gins, Fraud MD      Encounter Date: 04/03/2020  Check In:  Session Check In - 04/03/20 1107      Check-In   Supervising physician immediately available to respond to emergencies See telemetry face sheet for immediately available ER MD    Location [facility]-Cardiac & Pulmonary Rehab    Staff Present Renita Papa, RN BSN;Joseph Foy [place], [place], ACSM CEP, Exercise Physiologist;[name] RDN, LDN;Jessica [place], MA, RCEP, CCRP, CCET    Virtual Visit No    Medication changes reported     No    Fall or balance concerns reported    No    Warm-up and Cool-down Performed on first and last piece of equipment    Resistance Training Performed Yes    VAD Patient? No    PAD/SET Patient? No      Pain Assessment   Currently in Pain? No/denies              Social History   Tobacco Use  Smoking Status Never Smoker  Smokeless Tobacco Never Used    Goals Met:  Independence with exercise equipment Exercise tolerated well No report of cardiac concerns or symptoms Strength training completed today  Goals Unmet:  Not Applicable  Comments: Pt able to follow exercise prescription today without complaint.  Will continue to monitor for progression.    Dr. Emily Filbert is Medical Director for [place] and [facility].

## 2020-04-04 ENCOUNTER — Other Ambulatory Visit: Payer: Self-pay | Admitting: Infectious Diseases

## 2020-04-04 ENCOUNTER — Other Ambulatory Visit (HOSPITAL_COMMUNITY): Payer: Self-pay | Admitting: Infectious Diseases

## 2020-04-04 DIAGNOSIS — J9611 Chronic respiratory failure with hypoxia: Secondary | ICD-10-CM

## 2020-04-04 DIAGNOSIS — R1314 Dysphagia, pharyngoesophageal phase: Secondary | ICD-10-CM

## 2020-04-05 ENCOUNTER — Other Ambulatory Visit: Payer: Self-pay

## 2020-04-05 ENCOUNTER — Encounter: Payer: Medicare Other | Admitting: *Deleted

## 2020-04-05 DIAGNOSIS — J9611 Chronic respiratory failure with hypoxia: Secondary | ICD-10-CM | POA: Diagnosis not present

## 2020-04-05 NOTE — Progress Notes (Signed)
Daily Session Note  Patient Details  Name: Mackenzie Walsh MRN: [id number] Date of Birth: 15-Sep-1931 Referring Provider:     Pulmonary Rehab from 03/12/2020 in [facility] Cardiac and Pulmonary Rehab  Referring Provider Lanney Gins, Fraud MD      Encounter Date: 04/05/2020  Check In:  Session Check In - 04/05/20 1003      Check-In   Supervising physician immediately available to respond to emergencies See telemetry face sheet for immediately available ER MD    Location [facility]-Cardiac & Pulmonary Rehab    Staff Present Renita Papa, RN BSN;Joseph [address] [place], [place], RCEP, CCRP, CCET    Virtual Visit No    Medication changes reported     No    Fall or balance concerns reported    No    Warm-up and Cool-down Performed on first and last piece of equipment    Resistance Training Performed Yes    VAD Patient? No    PAD/SET Patient? No      Pain Assessment   Currently in Pain? No/denies              Social History   Tobacco Use  Smoking Status Never Smoker  Smokeless Tobacco Never Used    Goals Met:  Independence with exercise equipment Exercise tolerated well No report of cardiac concerns or symptoms Strength training completed today  Goals Unmet:  Not Applicable  Comments: Pt able to follow exercise prescription today without complaint.  Will continue to monitor for progression.    Dr. Emily Filbert is Medical Director for [place] and [facility].

## 2020-04-10 ENCOUNTER — Other Ambulatory Visit: Payer: Self-pay

## 2020-04-10 ENCOUNTER — Encounter: Payer: Medicare Other | Admitting: *Deleted

## 2020-04-10 DIAGNOSIS — J9611 Chronic respiratory failure with hypoxia: Secondary | ICD-10-CM

## 2020-04-10 NOTE — Progress Notes (Signed)
Daily Session Note  Patient Details  Name: Mackenzie Walsh MRN: [id number] Date of Birth: 1932/03/02 Referring Provider:     Pulmonary Rehab from 03/12/2020 in [facility] Cardiac and Pulmonary Rehab  Referring Provider Lanney Gins, Fraud MD      Encounter Date: 04/10/2020  Check In:  Session Check In - 04/10/20 1133      Check-In   Supervising physician immediately available to respond to emergencies See telemetry face sheet for immediately available ER MD    Location [facility]-Cardiac & Pulmonary Rehab    Staff Present [name], RN, BSN, CCRP;Kelly [place], MPA, RN;Jessica [place], MA, RCEP, CCRP, CCET;Joseph [employer], [place], ACSM CEP, Exercise Physiologist    Virtual Visit No    Medication changes reported     No    Fall or balance concerns reported    No    Warm-up and Cool-down Performed on first and last piece of equipment    Resistance Training Performed Yes    VAD Patient? No    PAD/SET Patient? No      Pain Assessment   Currently in Pain? No/denies              Social History   Tobacco Use  Smoking Status Never Smoker  Smokeless Tobacco Never Used    Goals Met:  Proper associated with RPD/PD & O2 Sat Independence with exercise equipment Exercise tolerated well No report of cardiac concerns or symptoms  Goals Unmet:  Not Applicable  Comments: Pt able to follow exercise prescription today without complaint.  Will continue to monitor for progression.    Dr. Emily Filbert is Medical Director for [place] and [facility].

## 2020-04-11 ENCOUNTER — Ambulatory Visit
Admission: RE | Admit: 2020-04-11 | Discharge: 2020-04-11 | Disposition: A | Discharge status: Home / Self Care | Payer: Medicare Other | Source: Ambulatory Visit | Attending: Infectious Diseases | Admitting: Infectious Diseases

## 2020-04-11 ENCOUNTER — Other Ambulatory Visit: Payer: Self-pay

## 2020-04-11 DIAGNOSIS — J9611 Chronic respiratory failure with hypoxia: Secondary | ICD-10-CM | POA: Insufficient documentation

## 2020-04-11 DIAGNOSIS — R1314 Dysphagia, pharyngoesophageal phase: Secondary | ICD-10-CM | POA: Diagnosis not present

## 2020-04-12 ENCOUNTER — Encounter: Payer: Medicare Other | Admitting: *Deleted

## 2020-04-12 DIAGNOSIS — J9611 Chronic respiratory failure with hypoxia: Secondary | ICD-10-CM | POA: Diagnosis not present

## 2020-04-12 NOTE — Progress Notes (Signed)
Daily Session Note  Patient Details  Name: Mackenzie Walsh MRN: [id number] Date of Birth: 06-18-1932 Referring Provider:     Pulmonary Rehab from 03/12/2020 in [facility] Cardiac and Pulmonary Rehab  Referring Provider Lanney Gins, Fraud MD      Encounter Date: 04/12/2020  Check In:  Session Check In - 04/12/20 0955      Check-In   Supervising physician immediately available to respond to emergencies See telemetry face sheet for immediately available ER MD    Location [facility]-Cardiac & Pulmonary Rehab    Staff Present Renita Papa, RN BSN;Joseph [address] [place], [place], RCEP, CCRP, CCET    Virtual Visit No    Medication changes reported     No    Fall or balance concerns reported    No    Warm-up and Cool-down Performed on first and last piece of equipment    Resistance Training Performed Yes    VAD Patient? No    PAD/SET Patient? No      Pain Assessment   Currently in Pain? No/denies              Social History   Tobacco Use  Smoking Status Never Smoker  Smokeless Tobacco Never Used    Goals Met:  Independence with exercise equipment Exercise tolerated well No report of cardiac concerns or symptoms Strength training completed today  Goals Unmet:  Not Applicable  Comments: Pt able to follow exercise prescription today without complaint.  Will continue to monitor for progression.    Dr. Emily Filbert is Medical Director for [place] and [facility].

## 2020-04-15 ENCOUNTER — Encounter: Payer: Medicare Other | Admitting: *Deleted

## 2020-04-15 ENCOUNTER — Other Ambulatory Visit: Payer: Self-pay

## 2020-04-15 DIAGNOSIS — J9611 Chronic respiratory failure with hypoxia: Secondary | ICD-10-CM

## 2020-04-15 NOTE — Progress Notes (Signed)
Daily Session Note  Patient Details  Name: Mackenzie Walsh MRN: [id number] Date of Birth: Jul 26, 1931 Referring Provider:     Pulmonary Rehab from 03/12/2020 in [facility] Cardiac and Pulmonary Rehab  Referring Provider Lanney Gins, Fraud MD      Encounter Date: 04/15/2020  Check In:  Session Check In - 04/15/20 1034      Check-In   Supervising physician immediately available to respond to emergencies See telemetry face sheet for immediately available ER MD    Location [facility]-Cardiac & Pulmonary Rehab    Staff Present Renita Papa, RN BSN;Joseph [address] [place], [place], ACSM CEP, Exercise Physiologist    Virtual Visit No    Medication changes reported     No    Fall or balance concerns reported    No    Warm-up and Cool-down Performed on first and last piece of equipment    Resistance Training Performed Yes    VAD Patient? No    PAD/SET Patient? No      Pain Assessment   Currently in Pain? No/denies              Social History   Tobacco Use  Smoking Status Never Smoker  Smokeless Tobacco Never Used    Goals Met:  Independence with exercise equipment Exercise tolerated well No report of cardiac concerns or symptoms Strength training completed today  Goals Unmet:  Not Applicable  Comments: Pt able to follow exercise prescription today without complaint.  Will continue to monitor for progression.    Dr. Emily Filbert is Medical Director for [place] and [facility].

## 2020-04-17 ENCOUNTER — Encounter: Payer: Medicare Other | Admitting: *Deleted

## 2020-04-17 ENCOUNTER — Other Ambulatory Visit: Payer: Self-pay

## 2020-04-17 DIAGNOSIS — J9611 Chronic respiratory failure with hypoxia: Secondary | ICD-10-CM

## 2020-04-17 NOTE — Progress Notes (Signed)
Daily Session Note  Patient Details  Name: Mackenzie Walsh MRN: [id number] Date of Birth: 04/06/1932 Referring Provider:     Pulmonary Rehab from 03/12/2020 in [facility] Cardiac and Pulmonary Rehab  Referring Provider Lanney Gins, Fraud MD      Encounter Date: 04/17/2020  Check In:  Session Check In - 04/17/20 1145      Check-In   Supervising physician immediately available to respond to emergencies See telemetry face sheet for immediately available ER MD    Location [facility]-Cardiac & Pulmonary Rehab    Staff Present Darel Hong, RN Vickki Hearing, BA, ACSM CEP, Exercise Physiologist;Joseph Tessie Fass RCP,RRT,BSRT    Virtual Visit No    Medication changes reported     No    Fall or balance concerns reported    No    Warm-up and Cool-down Performed on first and last piece of equipment    Resistance Training Performed Yes    VAD Patient? No    PAD/SET Patient? No      Pain Assessment   Currently in Pain? No/denies              Social History   Tobacco Use  Smoking Status Never Smoker  Smokeless Tobacco Never Used    Goals Met:  Proper associated with RPD/PD & O2 Sat Independence with exercise equipment Using PLB without cueing & demonstrates good technique Exercise tolerated well Strength training completed today  Goals Unmet:  Not Applicable  Comments: Pt able to follow exercise prescription today without complaint.  Will continue to monitor for progression.    Dr. Emily Filbert is Medical Director for [place] and [facility].

## 2020-04-19 ENCOUNTER — Other Ambulatory Visit: Payer: Self-pay

## 2020-04-19 DIAGNOSIS — J9611 Chronic respiratory failure with hypoxia: Secondary | ICD-10-CM

## 2020-04-19 NOTE — Progress Notes (Signed)
Daily Session Note  Patient Details  Name: Mackenzie Walsh MRN: [id number] Date of Birth: Oct 22, 1931 Referring Provider:     Pulmonary Rehab from 03/12/2020 in [facility]  Referring Provider Lanney Gins, Fraud MD      Encounter Date: 04/19/2020  Check In:  Session Check In - 04/19/20 1014      Check-In   Supervising physician immediately available to respond to emergencies See telemetry face sheet for immediately available ER MD    Location [facility] & Pul[facility]    Staff Present Alberteen Sam, MA, RCEP, CCRP, CCET;Cumi Sanagustin RN, BSN;Joseph [place] [employer]    Virtual Visit No    Medication changes reported     No    Fall or balance concerns reported    No    Warm-up and Cool-down Performed on first and last piece of equipment    Resistance Training Performed Yes    VAD Patient? No    PAD/SET Patient? No      Pain Assessment   Currently in Pain? No/denies              Social History   Tobacco Use  Smoking Status Never Smoker  Smokeless Tobacco Never Used    Goals Met:  Proper associated with RPD/PD & O2 Sat Independence with exercise equipment Exercise tolerated well No report of cardiac concerns or symptoms Strength training completed today  Goals Unmet:  Not Applicable  Comments: Pt able to follow exercise prescription today without complaint.  Will continue to monitor for progression.   Dr. Emily Filbert is Medical Director for [facility].

## 2020-04-22 ENCOUNTER — Telehealth: Payer: Self-pay

## 2020-04-22 ENCOUNTER — Encounter: Payer: Medicare Other | Attending: Pulmonary Disease | Admitting: *Deleted

## 2020-04-22 ENCOUNTER — Other Ambulatory Visit: Payer: Self-pay

## 2020-04-22 DIAGNOSIS — J9611 Chronic respiratory failure with hypoxia: Secondary | ICD-10-CM | POA: Diagnosis not present

## 2020-04-22 NOTE — Progress Notes (Signed)
Daily Session Note  Patient Details  Name: Terrel Y Spieler MRN: [id number] Date of Birth: 08/23/1931 Referring Provider:     Pulmonary Rehab from 03/12/2020 in [facility]  Referring Provider Aleskerov, Fraud MD      Encounter Date: 04/22/2020  Check In:  Session Check In - 04/22/20 1102      Check-In   Supervising physician immediately available to respond to emergencies See telemetry face sheet for immediately available ER MD    Location [facility]    Staff Present Meredith Craven, RN BSN;Joseph Hood RCP,RRT,BSRT;Kelly Hayes, BS, ACSM CEP, Exercise Physiologist;Kara Langdon, MS Exercise Physiologist    Virtual Visit No    Medication changes reported     No    Fall or balance concerns reported    No    Warm-up and Cool-down Performed on first and last piece of equipment    Resistance Training Performed Yes    VAD Patient? No    PAD/SET Patient? No      Pain Assessment   Currently in Pain? No/denies              Social History   Tobacco Use  Smoking Status Never Smoker  Smokeless Tobacco Never Used    Goals Met:  Independence with exercise equipment Exercise tolerated well No report of cardiac concerns or symptoms Strength training completed today  Goals Unmet:  Not Applicable  Comments: Pt able to follow exercise prescription today without complaint.  Will continue to monitor for progression.    Dr. Mark Miller is Medical Director for [facility] and [facility]. 

## 2020-04-22 NOTE — Telephone Encounter (Signed)
Her daughter in law was concerned about progression. We discussed that she has increased workloads and is about halfway through the program.  Family will contact Dr about her feeling tired at times.  Staff will review home exercise and PLB.

## 2020-04-24 ENCOUNTER — Other Ambulatory Visit: Payer: Self-pay

## 2020-04-24 ENCOUNTER — Encounter: Payer: Self-pay | Admitting: *Deleted

## 2020-04-24 DIAGNOSIS — J9611 Chronic respiratory failure with hypoxia: Secondary | ICD-10-CM

## 2020-04-24 NOTE — Progress Notes (Signed)
Pulmonary Individual Treatment Plan  Patient Details  Name: Mackenzie Walsh MRN: [id number] Date of Birth: Aug 19, 1931 Referring Provider:     Pulmonary Rehab from 03/12/2020 in [facility] Cardiac and Pulmonary Rehab  Referring Provider Lanney Gins, Fraud MD      Initial Encounter Date:    Pulmonary Rehab from 03/12/2020 in [facility] Cardiac and Pulmonary Rehab  Date 03/12/20      Visit Diagnosis: Chronic respiratory failure with hypoxia ([place])  Patient's Home Medications on Admission:  Current Outpatient Medications:  .  Acetaminophen 500 MG capsule, Take 2 capsules (1,000 mg total) by mouth 3 times/day as needed-between meals & bedtime., Disp: 30 capsule, Rfl:  .  albuterol (PROVENTIL) (2.5 MG/3ML) 0.083% nebulizer solution, Take 3 mLs (2.5 mg total) by nebulization every 4 (four) hours as needed for wheezing or shortness of breath., Disp: 75 mL, Rfl: 2 .  amiodarone (PACERONE) 200 MG tablet, Take 200 mg by mouth daily. , Disp: , Rfl:  .  bifidobacterium infantis (ALIGN) capsule, Take 1 capsule by mouth daily., Disp: , Rfl:  .  budesonide (PULMICORT) 0.5 MG/2ML nebulizer solution, Inhale 2 mLs into the lungs 2 (two) times daily. (Patient not taking: Reported on 03/11/2020), Disp: , Rfl:  .  CALCIUM CITRATE PO, Take 600 mg by mouth 2 (two) times daily as needed., Disp: , Rfl:  .  carvedilol (COREG) 12.5 MG tablet, Take 12.5 mg by mouth 2 (two) times daily with a meal. , Disp: , Rfl:  .  CITRACAL MAXIMUM 315-250 MG-UNIT TABS, , Disp: , Rfl:  .  Coenzyme Q10 (COQ-10) 100 MG CAPS, Take 100 mg by mouth daily. , Disp: , Rfl:  .  fluticasone (FLONASE) 50 MCG/ACT nasal spray, Place 1-2 sprays into both nostrils daily. , Disp: , Rfl:  .  furosemide (LASIX) 40 MG tablet, Take 20 mg by mouth daily., Disp: , Rfl:  .  gabapentin (NEURONTIN) 300 MG capsule, Take 300 mg by mouth 3 (three) times daily. , Disp: , Rfl:  .  levothyroxine (SYNTHROID) 112 MCG tablet, Take 112 mcg by mouth daily before breakfast. ,  Disp: , Rfl:  .  losartan (COZAAR) 25 MG tablet, Take 25 mg by mouth daily. , Disp: , Rfl:  .  Magnesium Oxide 200 MG TABS, Take 200 mg by mouth daily. , Disp: , Rfl:  .  Multiple Vitamin (MULTI-VITAMIN DAILY PO), Take 1 tablet by mouth daily.  (Patient not taking: Reported on 03/11/2020), Disp: , Rfl:  .  omeprazole (PRILOSEC) 40 MG capsule, Take 40 mg by mouth daily., Disp: , Rfl:  .  Rivaroxaban (XARELTO) 15 MG TABS tablet, Take 15 mg by mouth daily with supper. , Disp: , Rfl:  .  simvastatin (ZOCOR) 10 MG tablet, Take 10 mg by mouth at bedtime., Disp: , Rfl:  .  spironolactone (ALDACTONE) 25 MG tablet, Take 12.5 mg by mouth daily. , Disp: , Rfl:   Past Medical History: Past Medical History:  Diagnosis Date  . A-fib ([place])   . Allergy   . Anemia   . Arrhythmia   . Atrial fibrillation ([place])   . Broken femur ([place]) 2017  . Cataract   . Celiac disease   . Chronic pain syndrome   . Chronic systolic CHF (congestive heart failure) ([place])   . CKD (chronic kidney disease)   . COPD (chronic obstructive pulmonary disease) ([place])   . Dilated cardiomyopathy ([place])   . Hyperlipidemia   . Hypertension   . Migraines   . Mitral valve  regurgitation   . Osteoporosis   . Posterior rhinorrhea   . Scoliosis deformity of spine   . Senile hyperkeratosis   . Thyroid disease    hypothyroid  . Ventricular tachyarrhythmia (H[facility])     Tobacco Use: Social History   Tobacco Use  Smoking Status Never Smoker  Smokeless Tobacco Never Used    Labs: Recent Review Flowsheet Data   There is no flowsheet data to display.      Pulmonary Assessment Scores:  Pulmonary Assessment Scores    Row Name 03/12/20 1129         ADL UCSD   ADL Phase Entry     SOB Score total 61     Rest 0     Walk 3     Stairs 5     Bath 2     Dress 2     Shop 3       CAT Score   CAT Score 12       mMRC Score   mMRC Score 3            UCSD: Self-administered rating of dyspnea associated with activities of  daily living (ADLs) 6-point scale (0 = "not at all" to 5 = "maximal or unable to do because of breathlessness")  Scoring Scores range from 0 to 120.  Minimally important difference is 5 units  CAT: CAT can identify the health impairment of COPD patients and is better correlated with disease progression.  CAT has a scoring range of zero to 40. The CAT score is classified into four groups of low (less than 10), medium (10 - 20), high (21-30) and very high (31-40) based on the impact level of disease on health status. A CAT score over 10 suggests significant symptoms.  A worsening CAT score could be explained by an exacerbation, poor medication adherence, poor inhaler technique, or progression of COPD or comorbid conditions.  CAT MCID is 2 points  mMRC: mMRC (Modified Medical Research Council) Dyspnea Scale is used to assess the degree of baseline functional disability in patients of respiratory disease due to dyspnea. No minimal important difference is established. A decrease in score of 1 point or greater is considered a positive change.   Pulmonary Function Assessment:   Exercise Target Goals: Exercise Program Goal: Individual exercise prescription set using results from initial 6 min walk test and THRR while considering  patient's activity barriers and safety.   Exercise Prescription Goal: Initial exercise prescription builds to 30-45 minutes a day of aerobic activity, 2-3 days per week.  Home exercise guidelines will be given to patient during program as part of exercise prescription that the participant will acknowledge.  Education: Aerobic Exercise & Resistance Training: - Gives group verbal and written instruction on the various components of exercise. Focuses on aerobic and resistive training programs and the benefits of this training and how to safely progress through these programs..   Pulmonary Rehab from 04/17/2020 in [facility] Cardiac and Pulmonary Rehab  Date 04/17/20  Educator [facility]    Instruction Review Code 1- Verbalizes Understanding      Education: Exercise & Equipment Safety: - Individual verbal instruction and demonstration of equipment use and safety with use of the equipment.   Pulmonary Rehab from 04/17/2020 in [facility] Cardiac and Pulmonary Rehab  Date 03/12/20  Educator [facility]  Instruction Review Code 1- Verbalizes Understanding      Education: Exercise Physiology & General Exercise Guidelines: - Group verbal and written instruction with models to review  the exercise physiology of the cardiovascular system and associated critical values. Provides general exercise guidelines with specific guidelines to those with heart or lung disease.    Pulmonary Rehab from 04/17/2020 in [facility] Cardiac and Pulmonary Rehab  Date 04/03/20  Educator [facility]  Instruction Review Code 1- Verbalizes Understanding      Education: Flexibility, Balance, Mind/Body Relaxation: Provides group verbal/written instruction on the benefits of flexibility and balance training, including mind/body exercise modes such as yoga, pilates and tai chi.  Demonstration and skill practice provided.   Activity Barriers & Risk Stratification:  Activity Barriers & Cardiac Risk Stratification - 03/12/20 1122      Activity Barriers & Cardiac Risk Stratification   Activity Barriers Right Knee Replacement;Other (comment);Neck/Spine Problems;Shortness of Breath;Deconditioning;Muscular Weakness;Back Problems;Balance Concerns;Assistive Device;History of Falls    Comments Rod in femur, scoliosis, spinal stenosis, marginal vision in left eye, limited ROM from ICD, kyphosis, scoliosis           6 Minute Walk:  6 Minute Walk    Row Name 03/12/20 1120         6 Minute Walk   Phase Initial     Distance 410 feet     Walk Time 4.32 minutes     # of Rest Breaks 3  7 sec, 50 sec, 32 sec     MPH 1.08     METS 0.52     RPE 17     Perceived Dyspnea  4     VO2 Peak 1.82     Symptoms Yes (comment)     Comments SOB      Resting HR 81 bpm     Resting BP 130/64     Resting Oxygen Saturation  94 %     Exercise Oxygen Saturation  during 6 min walk 89 %     Max Ex. HR 95 bpm     Max Ex. BP 134/66     2 Minute Post BP 128/64       Interval HR   1 Minute HR 95     2 Minute HR 90     3 Minute HR 85     4 Minute HR 89     5 Minute HR 83     6 Minute HR 94     2 Minute Post HR 80     Interval Heart Rate? Yes       Interval Oxygen   Interval Oxygen? Yes     Baseline Oxygen Saturation % 94 %     1 Minute Oxygen Saturation % 92 %     1 Minute Liters of Oxygen 0 L  Room Air     2 Minute Oxygen Saturation % 89 %     2 Minute Liters of Oxygen 0 L     3 Minute Oxygen Saturation % 92 %     3 Minute Liters of Oxygen 0 L     4 Minute Oxygen Saturation % 90 %     4 Minute Liters of Oxygen 0 L     5 Minute Oxygen Saturation % 91 %     5 Minute Liters of Oxygen 0 L     6 Minute Oxygen Saturation % 89 %     6 Minute Liters of Oxygen 0 L     2 Minute Post Oxygen Saturation % 94 %     2 Minute Post Liters of Oxygen 0 L  Oxygen Initial Assessment:  Oxygen Initial Assessment - 03/12/20 1129      Home Oxygen   Home Oxygen Device Home Concentrator;E-Tanks    Sleep Oxygen Prescription Continuous    Liters per minute 2    Home Exercise Oxygen Prescription None    Home Resting Oxygen Prescription None    Compliance with Home Oxygen Use Yes      Initial 6 min Walk   Oxygen Used None      Program Oxygen Prescription   Program Oxygen Prescription None      Intervention   Short Term Goals To learn and exhibit compliance with exercise, home and travel O2 prescription;To learn and understand importance of monitoring SPO2 with pulse oximeter and demonstrate accurate use of the pulse oximeter.;To learn and understand importance of maintaining oxygen saturations>88%;To learn and demonstrate proper pursed lip breathing techniques or other breathing techniques.;To learn and demonstrate proper use of  respiratory medications    Long  Term Goals Exhibits compliance with exercise, home and travel O2 prescription;Verbalizes importance of monitoring SPO2 with pulse oximeter and return demonstration;Maintenance of O2 saturations>88%;Exhibits proper breathing techniques, such as pursed lip breathing or other method taught during program session;Compliance with respiratory medication;Demonstrates proper use of MDI's           Oxygen Re-Evaluation:  Oxygen Re-Evaluation    Row Name 04/10/20 1016             Program Oxygen Prescription   Program Oxygen Prescription None         Home Oxygen   Home Oxygen Device Home Concentrator;E-Tanks       Sleep Oxygen Prescription Continuous       Liters per minute 2       Home Exercise Oxygen Prescription None       Home Resting Oxygen Prescription None       Compliance with Home Oxygen Use Yes         Goals/Expected Outcomes   Short Term Goals To learn and demonstrate proper pursed lip breathing techniques or other breathing techniques.;To learn and exhibit compliance with exercise, home and travel O2 prescription       Long  Term Goals Exhibits compliance with exercise, home and travel O2 prescription;Exhibits proper breathing techniques, such as pursed lip breathing or other method taught during program session       Comments Informed patient how to perform the Pursed Lipped breathing technique. Told patient to Inhale through the nose and out the mouth with pursed lips to keep their airways open, help oxygenate them better, practice when at rest or doing strenuous activity. Patient Verbalizes understanding of technique and will work on and be reiterated during [place].       Goals/Expected Outcomes Short: use PLB with exertion. Long: use PLB on exertion proficiently and independently.              Oxygen Discharge (Final Oxygen Re-Evaluation):  Oxygen Re-Evaluation - 04/10/20 1016      Program Oxygen Prescription   Program Oxygen  Prescription None      Home Oxygen   Home Oxygen Device Home Concentrator;E-Tanks    Sleep Oxygen Prescription Continuous    Liters per minute 2    Home Exercise Oxygen Prescription None    Home Resting Oxygen Prescription None    Compliance with Home Oxygen Use Yes      Goals/Expected Outcomes   Short Term Goals To learn and demonstrate proper pursed lip breathing techniques or other breathing techniques.;To learn  and exhibit compliance with exercise, home and travel O2 prescription    Long  Term Goals Exhibits compliance with exercise, home and travel O2 prescription;Exhibits proper breathing techniques, such as pursed lip breathing or other method taught during program session    Comments Informed patient how to perform the Pursed Lipped breathing technique. Told patient to Inhale through the nose and out the mouth with pursed lips to keep their airways open, help oxygenate them better, practice when at rest or doing strenuous activity. Patient Verbalizes understanding of technique and will work on and be reiterated during [place].    Goals/Expected Outcomes Short: use PLB with exertion. Long: use PLB on exertion proficiently and independently.           Initial Exercise Prescription:  Initial Exercise Prescription - 03/12/20 1100      Date of Initial Exercise RX and Referring Provider   Date 03/12/20    Referring Provider Lanney Gins, Fraud MD      Treadmill   MPH 0.7    Grade 0    Minutes 15    METs 1.5      Recumbant Bike   Level 1    RPM 50    Watts 5    Minutes 15    METs 1.5      NuStep   Level 1    SPM 80    Minutes 15    METs 1.5      REL-XR   Level 1    Speed 50    Minutes 15    METs 1.5      Prescription Details   Frequency (times per week) 3    Duration Progress to 30 minutes of continuous aerobic without signs/symptoms of physical distress      Intensity   THRR 40-80% of Max Heartrate 102-123    Ratings of Perceived Exertion 11-13     Perceived Dyspnea 0-4      Progression   Progression Continue to progress workloads to maintain intensity without signs/symptoms of physical distress.      Resistance Training   Training Prescription Yes    Weight 2 lb    Reps 10-15           Perform Capillary Blood Glucose checks as needed.  Exercise Prescription Changes:  Exercise Prescription Changes    Row Name 03/12/20 1100 03/13/20 1600 03/28/20 0700 04/10/20 1300       Response to Exercise   Blood Pressure (Admit) 1_0 132/74    Blood Pressure (Exercise) 134/66 102/78 122/70 120/66    Blood Pressure (Exit) 128/64 102/58 118/72 106/60    Heart Rate (Admit) 81 bpm 80 bpm 80 bpm 76 bpm    Heart Rate (Exercise) 95 bpm 58 bpm 81 bpm 72 bpm    Heart Rate (Exit) 79 bpm 80 bpm 80 bpm 79 bpm    Oxygen Saturation (Admit) 94 % 93 % 95 % 90 %    Oxygen Saturation (Exercise) 89 % 84 % 91 % 89 %    Oxygen Saturation (Exit) 94 % 92 % 95 % 92 %    Rating of Perceived Exertion (Exercise) _1 Perceived Dyspnea (Exercise) 4 -- 2 2    Symptoms SOB -- SOb --    Comments walk test results first day -- --    Duration -- Progress to 30 minutes of  aerobic without signs/symptoms of physical distress Continue with 30 min of aerobic exercise without signs/symptoms of  physical distress. Continue with 30 min of aerobic exercise without signs/symptoms of physical distress.    Intensity -- THRR unchanged THRR unchanged THRR unchanged      Progression   Progression -- Continue to progress workloads to maintain intensity without signs/symptoms of physical distress. Continue to progress workloads to maintain intensity without signs/symptoms of physical distress. Continue to progress workloads to maintain intensity without signs/symptoms of physical distress.    Average METs -- 1.2 1.2 1.2      Resistance Training   Training Prescription -- Yes Yes Yes    Weight -- 2 lb 2 lb 2 lb    Reps -- 10-15 10-15 10-15      Interval  Training   Interval Training -- -- No No      Recumbant Bike   Level -- -- 1 --    Minutes -- -- 15 --      NuStep   Level -- _0 SPM -- 80 -- 80    Minutes -- _1 METs -- 1.2 1.3 1.2      Biostep-RELP   Level -- -- 1 --    Minutes -- -- 15 --    METs -- -- 1 --           Exercise Comments:   Exercise Goals and Review:  Exercise Goals    Row Name 03/12/20 1125             Exercise Goals   Increase Physical Activity Yes       Intervention Provide advice, education, support and counseling about physical activity/exercise needs.;Develop an individualized exercise prescription for aerobic and resistive training based on initial evaluation findings, risk stratification, comorbidities and participant's personal goals.       Expected Outcomes Short Term: Attend rehab on a regular basis to increase amount of physical activity.;Long Term: Add in home exercise to make exercise part of routine and to increase amount of physical activity.;Long Term: Exercising regularly at least 3-5 days a week.       Increase Strength and Stamina Yes       Intervention Provide advice, education, support and counseling about physical activity/exercise needs.;Develop an individualized exercise prescription for aerobic and resistive training based on initial evaluation findings, risk stratification, comorbidities and participant's personal goals.       Expected Outcomes Short Term: Increase workloads from initial exercise prescription for resistance, speed, and METs.;Long Term: Improve cardiorespiratory fitness, muscular endurance and strength as measured by increased METs and functional capacity (6MWT);Short Term: Perform resistance training exercises routinely during rehab and add in resistance training at home       Able to understand and use rate of perceived exertion (RPE) scale Yes       Intervention Provide education and explanation on how to use RPE scale       Expected Outcomes Short  Term: Able to use RPE daily in rehab to express subjective intensity level;Long Term:  Able to use RPE to guide intensity level when exercising independently       Able to understand and use Dyspnea scale Yes       Intervention Provide education and explanation on how to use Dyspnea scale       Expected Outcomes Short Term: Able to use Dyspnea scale daily in rehab to express subjective sense of shortness of breath during exertion;Long Term: Able to use Dyspnea scale to guide intensity level when exercising independently  Knowledge and understanding of Target Heart Rate Range (THRR) Yes       Intervention Provide education and explanation of THRR including how the numbers were predicted and where they are located for reference       Expected Outcomes Short Term: Able to state/look up THRR;Short Term: Able to use daily as guideline for intensity in rehab;Long Term: Able to use THRR to govern intensity when exercising independently       Able to check pulse independently Yes       Intervention Provide education and demonstration on how to check pulse in carotid and radial arteries.;Review the importance of being able to check your own pulse for safety during independent exercise       Expected Outcomes Short Term: Able to explain why pulse checking is important during independent exercise;Long Term: Able to check pulse independently and accurately       Understanding of Exercise Prescription Yes       Intervention Provide education, explanation, and written materials on patient's individual exercise prescription       Expected Outcomes Short Term: Able to explain program exercise prescription;Long Term: Able to explain home exercise prescription to exercise independently              Exercise Goals Re-Evaluation :  Exercise Goals Re-Evaluation    Row Name 03/13/20 1140 03/28/20 0742 04/10/20 1021 04/10/20 1333 04/22/20 1307     Exercise Goal Re-Evaluation   Exercise Goals Review Increase  Physical Activity;Able to understand and use rate of perceived exertion (RPE) scale;Understanding of Exercise Prescription;Knowledge and understanding of Target Heart Rate Range (THRR);Increase Strength and Stamina;Able to understand and use Dyspnea scale Increase Physical Activity;Increase Strength and Stamina;Understanding of Exercise Prescription Increase Physical Activity;Increase Strength and Stamina Increase Physical Activity;Increase Strength and Stamina Increase Physical Activity;Increase Strength and Stamina   Comments Reviewed RPE and dyspnea scales, THR and program prescription with pt today.  Pt voiced understanding and was given a copy of goals to take home. Dawnyel is off to a good start in rehab.  She uses a towel to help cushion her back on the bike and the NuStep.  We will continue to work with her progression. Patient has been walking the halls in her apartment for exercise. She has yet to be informed of home exercise. She has been doing well in [place] and has been able to increase some of her workloads. She has a rod in her left leg and sometimes it inhibits her from doing other exercises. Patient has been walking the halls in her apartment for exercise. She has yet to be informed of home exercise. She has been doing well in [place] and has been able to increase some of her workloads. She has a rod in her left leg and sometimes it inhibits her from doing other exercises. Reviewed home exercise with pt today.  Pt plans to  for exercise.  Reviewed THR, pulse, RPE, sign and symptoms, pulse oximetery and when to call 911 or MD.  Also discussed weather considerations and indoor options.  Pt voiced understanding.   Expected Outcomes Short: Use RPE daily to regulate intensity. Long: Follow program prescription in THR. Short: start to increase workloads Long: Continue follow program prescription Short: continue to walk at home and talk to EP about home exercise. Long: maintain an exercise routine  at home independently pose [employer]. Short: continue to walk at home and talk to EP about home exercise. Long: maintain an exercise routine at home independently pose  [employer]. Short: add one day of exercise outside program sessions          Discharge Exercise Prescription (Final Exercise Prescription Changes):  Exercise Prescription Changes - 04/10/20 1300      Response to Exercise   Blood Pressure (Admit) 132/74    Blood Pressure (Exercise) 120/66    Blood Pressure (Exit) 106/60    Heart Rate (Admit) 76 bpm    Heart Rate (Exercise) 72 bpm    Heart Rate (Exit) 79 bpm    Oxygen Saturation (Admit) 90 %    Oxygen Saturation (Exercise) 89 %    Oxygen Saturation (Exit) 92 %    Rating of Perceived Exertion (Exercise) 12    Perceived Dyspnea (Exercise) 2    Duration Continue with 30 min of aerobic exercise without signs/symptoms of physical distress.    Intensity THRR unchanged      Progression   Progression Continue to progress workloads to maintain intensity without signs/symptoms of physical distress.    Average METs 1.2      Resistance Training   Training Prescription Yes    Weight 2 lb    Reps 10-15      Interval Training   Interval Training No      NuStep   Level 3    SPM 80    Minutes 15    METs 1.2           Nutrition:  Target Goals: Understanding of nutrition guidelines, daily intake of sodium <1565m, cholesterol <2074m calories 30% from fat and 7% or less from saturated fats, daily to have 5 or more servings of fruits and vegetables.  Education: Controlling Sodium/Reading Food Labels -Group verbal and written material supporting the discussion of sodium use in heart healthy nutrition. Review and explanation with models, verbal and written materials for utilization of the food label.   Education: General Nutrition Guidelines/Fats and Fiber: -Group instruction provided by verbal, written material, models and posters to present the general guidelines for  heart healthy nutrition. Gives an explanation and review of dietary fats and fiber.   Biometrics:  Pre Biometrics - 03/12/20 1126      Pre Biometrics   Height 5' 2.2" (1.58 m)    Weight 133 lb (60.3 kg)    BMI (Calculated) 24.17    Single Leg Stand 1.3 seconds            Nutrition Therapy Plan and Nutrition Goals:  Nutrition Therapy & Goals - 03/18/20 0916      Nutrition Therapy   Diet Pulmonary MNT    Protein (specify units) 75g    Fiber 25 grams    Whole Grain Foods 3 servings    Saturated Fats 12 max. grams    Fruits and Vegetables 5 servings/day    Sodium 1.5 grams      Personal Nutrition Goals   Nutrition Goal ST: Add protein food to breakfast consistently - egg or protein shake for example, eat more moist foods to help with swallowing LT: improve SOB    Comments No problems breathing during or after eating. Reports having trouble swallowing. Doesn't like spicy food. Appetite is somewhat lower than it has been. B: blueberries with dry cereal and milk w/ oj or an egg with toast and sausage with a banana every morning. L: Toasted sandwich with ham and cheese, slice of cheese with [place] and tomato with a side salad. sometimes lunch from [employer]. C. [place]: fruit with some yogurt. D: dining hall -  meat, starch, vegetables with salad. Drinks: OJ and decaf coffee, water and apple juice, decaf tea. Pt uses gluten free bread. Pt reports she has been losing some weight 138-133 in last few months. Discussed pulmonary medical nutrition therapy. Pt reported not not wanting to change much of what she is eating, is willing to add in more protein to breakfast.      Intervention Plan   Intervention Prescribe, educate and counsel regarding individualized specific dietary modifications aiming towards targeted core components such as weight, hypertension, lipid management, diabetes, heart failure and other comorbidities.;Nutrition handout(s) given to patient.    Expected  Outcomes Short Term Goal: Understand basic principles of dietary content, such as calories, fat, sodium, cholesterol and nutrients.;Short Term Goal: A plan has been developed with personal nutrition goals set during dietitian appointment.;Long Term Goal: Adherence to prescribed nutrition plan.           Nutrition Assessments:  Nutrition Assessments - 03/12/20 1126      MEDFICTS Scores   Pre Score 67           MEDIFICTS Score Key:          ?70 Need to make dietary changes          40-70 Heart Healthy Diet         ? 40 Therapeutic Level Cholesterol Diet  Nutrition Goals Re-Evaluation:  Nutrition Goals Re-Evaluation    [place] Name 04/03/20 1008             Goals   Nutrition Goal ST: Add  protein shake on the mornings she does not eat eggs, continue to eat more moist foods to help with swallowing LT: improve SOB       Comment She reports having an egg every other morning with some fruits. Will try a protien drink like ensure or boost. Worse time swallowing and will address it during her MD appointment tomorrow.       Expected Outcome Have an easier time swallowing and meet protein needs.              Nutrition Goals Discharge (Final Nutrition Goals Re-Evaluation):  Nutrition Goals Re-Evaluation - 04/03/20 1008      Goals   Nutrition Goal ST: Add  protein shake on the mornings she does not eat eggs, continue to eat more moist foods to help with swallowing LT: improve SOB    Comment She reports having an egg every other morning with some fruits. Will try a protien drink like ensure or boost. Worse time swallowing and will address it during her MD appointment tomorrow.    Expected Outcome Have an easier time swallowing and meet protein needs.           Psychosocial: Target Goals: Acknowledge presence or absence of significant depression and/or stress, maximize coping skills, provide positive support system. Participant is able to verbalize types and ability to use techniques  and skills needed for reducing stress and depression.   Education: Depression - Provides group verbal and written instruction on the correlation between heart/lung disease and depressed mood, treatment options, and the stigmas associated with seeking treatment.   Pulmonary Rehab from 04/17/2020 in [facility] Cardiac and Pulmonary Rehab  Date 03/27/20  Educator [facility]  Instruction Review Code 1- Verbalizes Understanding      Education: Sleep Hygiene -Provides group verbal and written instruction about how sleep can affect your health.  Define sleep hygiene, discuss sleep cycles and impact of sleep habits. Review good sleep hygiene tips.  Education: Stress and Anxiety: - Provides group verbal and written instruction about the health risks of elevated stress and causes of high stress.  Discuss the correlation between heart/lung disease and anxiety and treatment options. Review healthy ways to manage with stress and anxiety.   Initial Review & Psychosocial Screening:  Initial Psych Review & Screening - 03/11/20 1122      Initial Review   Current issues with Current Stress Concerns    Source of Stress Concerns Family    Comments Husband diagnosed with stage 1 memory loss      Family Dynamics   Good Support System? Yes   Son and daughter in law.  husband of 7     Barriers   Psychosocial barriers to participate in program There are no identifiable barriers or psychosocial needs.;The patient should benefit from training in stress management and relaxation.      Screening Interventions   Interventions Encouraged to exercise;To provide support and resources with identified psychosocial needs;Provide feedback about the scores to participant    Expected Outcomes Short Term goal: Utilizing psychosocial counselor, staff and physician to assist with identification of specific Stressors or current issues interfering with healing process. Setting desired goal for each stressor or current issue  identified.;Long Term Goal: Stressors or current issues are controlled or eliminated.;Short Term goal: Identification and review with participant of any Quality of Life or Depression concerns found by scoring the questionnaire.;Long Term goal: The participant improves quality of Life and PHQ9 Scores as seen by post scores and/or verbalization of changes           Quality of Life Scores:  Scores of 19 and below usually indicate a poorer quality of life in these areas.  A difference of  2-3 points is a clinically meaningful difference.  A difference of 2-3 points in the total score of the Quality of Life Index has been associated with significant improvement in overall quality of life, self-image, physical symptoms, and general health in studies assessing change in quality of life.  PHQ-9: Recent Review Flowsheet Data    Depression screen [facility] 2/9 03/12/2020   Decreased Interest 1   Down, Depressed, Hopeless 0   PHQ - 2 Score 1   Altered sleeping 1   Tired, decreased energy 2   Change in appetite 0   Feeling bad or failure about yourself  0   Trouble concentrating 0   Moving slowly or fidgety/restless 0   Suicidal thoughts 0   PHQ-9 Score 4   Difficult doing work/chores Somewhat difficult     Interpretation of Total Score  Total Score Depression Severity:  1-4 = Minimal depression, 5-9 = Mild depression, 10-14 = Moderate depression, 15-19 = Moderately severe depression, 20-27 = Severe depression   Psychosocial Evaluation and Intervention:  Psychosocial Evaluation - 03/11/20 1132      Psychosocial Evaluation & Interventions   Interventions Encouraged to exercise with the program and follow exercise prescription    Comments Proscilla is coming to the program without any major barriers. She is concerned about her husband of 66 years that has been recently diagnosed with Stage 1 Memory Loss. They have a support system with their son and duaghter in law that live in [place] and they live  in a continous care community and have all the resources there for them to utilize. She hope to be able to breath better after completing the program.    Expected Outcomes STG: [place] will attend her scheduled sessions. LTG: Lannette Donath  will continue to follow the exercise and eduction guidelines to maintain what she gained in the program.    Continue Psychosocial Services  Follow up required by staff           Psychosocial Re-Evaluation:  Psychosocial Re-Evaluation    [place] Name 04/10/20 1020             Psychosocial Re-Evaluation   Current issues with None Identified       Comments Patient reports no issues with their current mental states, sleep, stress, depression or anxiety. Will follow up with patient in a few weeks for any changes.       Expected Outcomes Short: Continue to exercise regularly to support mental health and notify staff of any changes. Long: maintain mental health and well being through teaching of rehab or prescribed medications independently.       Interventions Encouraged to attend Pulmonary Rehabilitation for the exercise       Continue Psychosocial Services  Follow up required by staff              Psychosocial Discharge (Final Psychosocial Re-Evaluation):  Psychosocial Re-Evaluation - 04/10/20 1020      Psychosocial Re-Evaluation   Current issues with None Identified    Comments Patient reports no issues with their current mental states, sleep, stress, depression or anxiety. Will follow up with patient in a few weeks for any changes.    Expected Outcomes Short: Continue to exercise regularly to support mental health and notify staff of any changes. Long: maintain mental health and well being through teaching of rehab or prescribed medications independently.    Interventions Encouraged to attend Pulmonary Rehabilitation for the exercise    Continue Psychosocial Services  Follow up required by staff           Education: Education Goals: Education  classes will be provided on a weekly basis, covering required topics. Participant will state understanding/return demonstration of topics presented.  Learning Barriers/Preferences:  Learning Barriers/Preferences - 03/11/20 1125      Learning Barriers/Preferences   Learning Barriers None    Learning Preferences None           General Pulmonary Education Topics:  Infection Prevention: - Provides verbal and written material to individual with discussion of infection control including proper hand washing and proper equipment cleaning during exercise session.   Pulmonary Rehab from 04/17/2020 in [facility] Cardiac and Pulmonary Rehab  Date 03/12/20  Educator [facility]  Instruction Review Code 1- Verbalizes Understanding      Falls Prevention: - Provides verbal and written material to individual with discussion of falls prevention and safety.   Pulmonary Rehab from 04/17/2020 in [facility] Cardiac and Pulmonary Rehab  Date 03/11/20  Educator [facility]  Instruction Review Code 1- Verbalizes Understanding      Chronic Lung Diseases: - Group verbal and written instruction to review updates, respiratory medications, advancements in procedures and treatments. Discuss use of supplemental oxygen including available portable oxygen systems, continuous and intermittent flow rates, concentrators, personal use and safety guidelines. Review proper use of inhaler and spacers. Provide informative websites for self-education.    Pulmonary Rehab from 04/17/2020 in [facility] Cardiac and Pulmonary Rehab  Date 03/12/20  Instruction Review Code 3- Needs Reinforcement  [need identified]      Energy Conservation: - Provide group verbal and written instruction for methods to conserve energy, plan and organize activities. Instruct on pacing techniques, use of adaptive equipment and posture/positioning to relieve shortness of breath.   Pulmonary Rehab from  04/17/2020 in [facility] Cardiac and Pulmonary Rehab  Date 03/20/20  Educator [facility]   Instruction Review Code 1- Verbalizes Understanding      Triggers and Exacerbations: - Group verbal and written instruction to review types of environmental triggers and ways to prevent exacerbations. Discuss weather changes, air quality and the benefits of nasal washing. Review warning signs and symptoms to help prevent infections. Discuss techniques for effective airway clearance, coughing, and vibrations.   Pulmonary Rehab from 04/17/2020 in [facility] Cardiac and Pulmonary Rehab  Date 03/20/20  Educator [facility]  Instruction Review Code 1- Verbalizes Understanding      AED/CPR: - Group verbal and written instruction with the use of models to demonstrate the basic use of the AED with the basic ABC's of resuscitation.   Anatomy and Physiology of the Lungs: - Group verbal and written instruction with the use of models to provide basic lung anatomy and physiology related to function, structure and complications of lung disease.   Pulmonary Rehab from 04/17/2020 in [facility] Cardiac and Pulmonary Rehab  Date 03/20/20  Educator [facility]  Instruction Review Code 1- Verbalizes Understanding      Anatomy & Physiology of the Heart: - Group verbal and written instruction and models provide basic cardiac anatomy and physiology, with the coronary electrical and arterial systems. Review of Valvular disease and Heart Failure   Cardiac Medications: - Group verbal and written instruction to review commonly prescribed medications for heart disease. Reviews the medication, class of the drug, and side effects.   Other: -Provides group and verbal instruction on various topics (see comments)   Knowledge Questionnaire Score:  Knowledge Questionnaire Score - 03/12/20 1126      Knowledge Questionnaire Score   Pre Score 17/18 Education Focus: O2 safety            Core Components/Risk Factors/Patient Goals at Admission:  Personal Goals and Risk Factors at Admission - 03/12/20 1128      Core Components/Risk  Factors/Patient Goals on Admission    Weight Management Yes;Weight Maintenance    Intervention Weight Management: Develop a combined nutrition and exercise program designed to reach desired caloric intake, while maintaining appropriate intake of nutrient and fiber, sodium and fats, and appropriate energy expenditure required for the weight goal.;Weight Management: Provide education and appropriate resources to help participant work on and attain dietary goals.    Admit Weight 133 lb (60.3 kg)    Goal Weight: Short Term 133 lb (60.3 kg)    Goal Weight: Long Term 130 lb (59 kg)    Expected Outcomes Short Term: Continue to assess and modify interventions until short term weight is achieved;Long Term: Adherence to nutrition and physical activity/exercise program aimed toward attainment of established weight goal;Weight Maintenance: Understanding of the daily nutrition guidelines, which includes 25-35% calories from fat, 7% or less cal from saturated fats, less than 233m cholesterol, less than 1.5gm of sodium, & 5 or more servings of fruits and vegetables daily    Improve shortness of breath with ADL's Yes    Intervention Provide education, individualized exercise plan and daily activity instruction to help decrease symptoms of SOB with activities of daily living.    Expected Outcomes Short Term: Improve cardiorespiratory fitness to achieve a reduction of symptoms when performing ADLs;Long Term: Be able to perform more ADLs without symptoms or delay the onset of symptoms    Hypertension Yes    Intervention Provide education on lifestyle modifcations including regular physical activity/exercise, weight management, moderate sodium restriction and increased consumption of  fresh fruit, vegetables, and low fat dairy, alcohol moderation, and smoking cessation.;Monitor prescription use compliance.    Expected Outcomes Short Term: Continued assessment and intervention until BP is < 140/24m HG in hypertensive  participants. < 130/824mHG in hypertensive participants with diabetes, heart failure or chronic kidney disease.;Long Term: Maintenance of blood pressure at goal levels.    Lipids Yes    Intervention Provide education and support for participant on nutrition & aerobic/resistive exercise along with prescribed medications to achieve LDL <7066mHDL >56m6m  Expected Outcomes Short Term: Participant states understanding of desired cholesterol values and is compliant with medications prescribed. Participant is following exercise prescription and nutrition guidelines.;Long Term: Cholesterol controlled with medications as prescribed, with individualized exercise RX and with personalized nutrition plan. Value goals: LDL < 70mg79mL > 40 mg.           Education:Diabetes - Individual verbal and written instruction to review signs/symptoms of diabetes, desired ranges of glucose level fasting, after meals and with exercise. Acknowledge that pre and post exercise glucose checks will be done for 3 sessions at entry of program.   Education: Know Your Numbers and Risk Factors: -Group verbal and written instruction about important numbers in your health.  Discussion of what are risk factors and how they play a role in the disease process.  Review of Cholesterol, Blood Pressure, Diabetes, and BMI and the role they play in your overall health.   [facility]  Date 03/13/20  [facility] Code 1- Verbalizes Understanding      Core Components/Risk Factors/Patient Goals Review:   Goals and Risk Factor Review    [place] Name 04/10/20 1019             Core Components/Risk Factors/Patient Goals Review   Personal Goals Review Improve shortness of breath with ADL's       Review Spoke to patient about their shortness of breath and what they can do to improve. Patient has been informed of breathing techniques when starting the program. Patient  is informed to tell staff if they have had any med changes and that certain meds they are taking or not taking can be causing shortness of breath.       Expected Outcomes Short: Attend [facility] regularly to improve shortness of breath with ADL's. Long: maintain independence with ADL's              Core Components/Risk Factors/Patient Goals at Discharge (Final Review):   Goals and Risk Factor Review - 04/10/20 1019      Core Components/Risk Factors/Patient Goals Review   Personal Goals Review Improve shortness of breath with ADL's    Review Spoke to patient about their shortness of breath and what they can do to improve. Patient has been informed of breathing techniques when starting the program. Patient is informed to tell staff if they have had any med changes and that certain meds they are taking or not taking can be causing shortness of breath.    Expected Outcomes Short: Attend [facility] regularly to improve shortness of breath with ADL's. Long: maintain independence with ADL's           ITP Comments:  ITP Comments    [place] Name 03/11/20 1138 03/12/20 1118 03/13/20 1140 03/18/20 0933 03/27/20 0522   ITP Comments Virtual orientation call completed today. shehas an appointment on Date: 03/12/2020 for EP eval and gym Orientation.  Documentation of diagnosis can be  found in [facility] Date: 01/30/2020. Completed 6MWT and gym orientation. Initial ITP created and sent for review to Dr. Emily Filbert, Medical Director. First full day of exercise!  Patient was oriented to gym and equipment including functions, settings, policies, and procedures.  Patient's individual exercise prescription and treatment plan were reviewed.  All starting workloads were established based on the results of the 6 minute walk test done at initial orientation visit.  The plan for exercise progression was also introduced and progression will be customized based on patient's performance and goals. Completed Initial RD evaluation 30  Day review completed. Medical Director ITP review done, changes made as directed, and signed approval by Medical Director.   [place] Name 04/24/20 0613           ITP Comments 30 Day review completed. Medical Director ITP review done, changes made as directed, and signed approval by Medical Director.              Comments:

## 2020-04-24 NOTE — Progress Notes (Signed)
Daily Session Note  Patient Details  Name: EMILYANNE MCGOUGH MRN: [id number] Date of Birth: 1932-01-16 Referring Provider:     Pulmonary Rehab from 03/12/2020 in [facility] Cardiac and Pulmonary Rehab  Referring Provider Lanney Gins, Fraud MD      Encounter Date: 04/24/2020  Check In:  Session Check In - 04/24/20 1000      Check-In   Supervising physician immediately available to respond to emergencies See telemetry face sheet for immediately available ER MD    Location [facility]-Cardiac & Pulmonary Rehab    Staff Present Birdie Sons, MPA, RN;Joseph Darrin Nipper, [place], RCEP, CCRP, CCET    Virtual Visit No    Medication changes reported     No    Fall or balance concerns reported    No    Warm-up and Cool-down Performed on first and last piece of equipment    Resistance Training Performed Yes    VAD Patient? No    PAD/SET Patient? No      Pain Assessment   Currently in Pain? No/denies              Social History   Tobacco Use  Smoking Status Never Smoker  Smokeless Tobacco Never Used    Goals Met:  Independence with exercise equipment Exercise tolerated well No report of cardiac concerns or symptoms Strength training completed today  Goals Unmet:  Not Applicable  Comments: Pt able to follow exercise prescription today without complaint.  Will continue to monitor for progression.    Dr. Emily Filbert is Medical Director for [place] and [facility].

## 2020-04-26 ENCOUNTER — Other Ambulatory Visit: Payer: Self-pay

## 2020-04-26 ENCOUNTER — Encounter: Payer: Medicare Other | Admitting: *Deleted

## 2020-04-26 DIAGNOSIS — J9611 Chronic respiratory failure with hypoxia: Secondary | ICD-10-CM | POA: Diagnosis not present

## 2020-04-26 NOTE — Progress Notes (Signed)
Daily Session Note  Patient Details  Name: NECIA KAMM MRN: [id number] Date of Birth: 1931-12-06 Referring Provider:     Pulmonary Rehab from 03/12/2020 in [facility] Cardiac and Pulmonary Rehab  Referring Provider Lanney Gins, Fraud MD      Encounter Date: 04/26/2020  Check In:  Session Check In - 04/26/20 1017      Check-In   Supervising physician immediately available to respond to emergencies See telemetry face sheet for immediately available ER MD    Location [facility]-Cardiac & Pulmonary Rehab    Staff Present [name], RN, BSN, CCRP;Jessica [place], MA, RCEP, CCRP, CCET;Joseph [place] RCP,RRT,BSRT    Virtual Visit No    Medication changes reported     No    Fall or balance concerns reported    No    Warm-up and Cool-down Performed on first and last piece of equipment    Resistance Training Performed Yes    VAD Patient? No    PAD/SET Patient? No      Pain Assessment   Currently in Pain? No/denies              Social History   Tobacco Use  Smoking Status Never Smoker  Smokeless Tobacco Never Used    Goals Met:  Proper associated with RPD/PD & O2 Sat Independence with exercise equipment Exercise tolerated well No report of cardiac concerns or symptoms  Goals Unmet:  Not Applicable  Comments: Pt able to follow exercise prescription today without complaint.  Will continue to monitor for progression.    Dr. Emily Filbert is Medical Director for [place] and [facility].

## 2020-04-28 ENCOUNTER — Encounter (INDEPENDENT_AMBULATORY_CARE_PROVIDER_SITE_OTHER): Payer: Medicare Other | Admitting: Cardiovascular Disease

## 2020-04-28 DIAGNOSIS — Z95810 Presence of automatic (implantable) cardiac defibrillator: Secondary | ICD-10-CM

## 2020-04-29 ENCOUNTER — Other Ambulatory Visit: Payer: Self-pay

## 2020-04-29 ENCOUNTER — Encounter: Payer: Medicare Other | Admitting: *Deleted

## 2020-04-29 DIAGNOSIS — J9611 Chronic respiratory failure with hypoxia: Secondary | ICD-10-CM | POA: Diagnosis not present

## 2020-04-29 NOTE — Progress Notes (Signed)
Daily Session Note  Patient Details  Name: Mackenzie Walsh MRN: [id number] Date of Birth: [birth year] Referring Provider:     Pulmonary Rehab from 03/12/2020 in [facility] Cardiac and Pulmonary Rehab  Referring Provider Lanney Gins, Fraud MD      Encounter Date: 04/29/2020  Check In:  Session Check In - 04/29/20 0954      Check-In   Supervising physician immediately available to respond to emergencies See telemetry face sheet for immediately available ER MD    Location [facility]-Cardiac & Pulmonary Rehab    Staff Present [name], RN, BSN, Laveda Norman, BS, ACSM CEP, Exercise Physiologist;Joseph Tessie Fass RCP,RRT,BSRT    Virtual Visit No    Medication changes reported     No    Fall or balance concerns reported    No    Warm-up and Cool-down Performed on first and last piece of equipment    Resistance Training Performed Yes    VAD Patient? No    PAD/SET Patient? No      Pain Assessment   Currently in Pain? No/denies              Social History   Tobacco Use  Smoking Status Never Smoker  Smokeless Tobacco Never Used    Goals Met:  Proper associated with RPD/PD & O2 Sat Independence with exercise equipment Exercise tolerated well No report of cardiac concerns or symptoms  Goals Unmet:  Not Applicable  Comments: Pt able to follow exercise prescription today without complaint.  Will continue to monitor for progression.    Dr. Emily Filbert is Medical Director for [place] and [facility].

## 2020-05-01 ENCOUNTER — Other Ambulatory Visit: Payer: Self-pay

## 2020-05-01 DIAGNOSIS — J9611 Chronic respiratory failure with hypoxia: Secondary | ICD-10-CM

## 2020-05-01 NOTE — Progress Notes (Signed)
Daily Session Note  Patient Details  Name: NINOSKA GOSWICK MRN: [id number] Date of Birth: 1931-09-04 Referring Provider:     Pulmonary Rehab from 03/12/2020 in [facility] Cardiac and Pulmonary Rehab  Referring Provider Lanney Gins, Fraud MD      Encounter Date: 05/01/2020  Check In:  Session Check In - 05/01/20 0955      Check-In   Supervising physician immediately available to respond to emergencies See telemetry face sheet for immediately available ER MD    Location [facility]-Cardiac & Pulmonary Rehab    Staff Present Birdie Sons, MPA, RN;Amanda Oletta Darter, BA, ACSM CEP, Exercise Physiologist;Jessica Luan Pulling, MA, RCEP, CCRP, CCET    Virtual Visit No    Medication changes reported     No    Fall or balance concerns reported    No    Warm-up and Cool-down Performed on first and last piece of equipment    Resistance Training Performed Yes    VAD Patient? No    PAD/SET Patient? No      Pain Assessment   Currently in Pain? No/denies              Social History   Tobacco Use  Smoking Status Never Smoker  Smokeless Tobacco Never Used    Goals Met:  Independence with exercise equipment Exercise tolerated well No report of cardiac concerns or symptoms Strength training completed today  Goals Unmet:  Not Applicable  Comments: Pt able to follow exercise prescription today without complaint.  Will continue to monitor for progression.    Dr. Emily Filbert is Medical Director for [place] and [facility].

## 2020-05-03 ENCOUNTER — Other Ambulatory Visit: Payer: Self-pay

## 2020-05-03 ENCOUNTER — Encounter: Payer: Medicare Other | Admitting: *Deleted

## 2020-05-03 DIAGNOSIS — J9611 Chronic respiratory failure with hypoxia: Secondary | ICD-10-CM

## 2020-05-03 NOTE — Progress Notes (Signed)
Daily Session Note  Patient Details  Name: Mackenzie Walsh MRN: [id number] Date of Birth: 1931/12/10 Referring Provider:     Pulmonary Rehab from 03/12/2020 in [facility] Cardiac and Pulmonary Rehab  Referring Provider Lanney Gins, Fraud MD      Encounter Date: 05/03/2020  Check In:  Session Check In - 05/03/20 1015      Check-In   Supervising physician immediately available to respond to emergencies See telemetry face sheet for immediately available ER MD    Location [facility]-Cardiac & Pulmonary Rehab    Staff Present Renita Papa, RN BSN;Joseph Darrin Nipper, [place], RCEP, CCRP, CCET;Susanne Bice, RN, BSN, CCRP    Virtual Visit No    Medication changes reported     No    Fall or balance concerns reported    No    Warm-up and Cool-down Performed on first and last piece of equipment    Resistance Training Performed Yes    VAD Patient? No    PAD/SET Patient? No      Pain Assessment   Currently in Pain? No/denies              Social History   Tobacco Use  Smoking Status Never Smoker  Smokeless Tobacco Never Used    Goals Met:  Independence with exercise equipment Exercise tolerated well No report of cardiac concerns or symptoms Strength training completed today  Goals Unmet:  Not Applicable  Comments: Pt able to follow exercise prescription today without complaint.  Will continue to monitor for progression.    Dr. Emily Filbert is Medical Director for [place] and [facility].

## 2020-05-06 ENCOUNTER — Encounter (INDEPENDENT_AMBULATORY_CARE_PROVIDER_SITE_OTHER): Payer: Self-pay

## 2020-05-06 ENCOUNTER — Encounter: Payer: Medicare Other | Admitting: *Deleted

## 2020-05-06 ENCOUNTER — Other Ambulatory Visit: Payer: Self-pay

## 2020-05-06 DIAGNOSIS — J9611 Chronic respiratory failure with hypoxia: Secondary | ICD-10-CM

## 2020-05-06 NOTE — Progress Notes (Signed)
Patient sent remote transmit as scheduled on 05/03/2020. Battery life and Leads are stable. Additional information available by request.

## 2020-05-06 NOTE — Progress Notes (Signed)
Daily Session Note  Patient Details  Name: Mackenzie Walsh MRN: [id number] Date of Birth: [birth year] Referring Provider:     Pulmonary Rehab from 03/12/2020 in [facility] Cardiac and Pulmonary Rehab  Referring Provider Lanney Gins, Fraud MD      Encounter Date: 05/06/2020  Check In:  Session Check In - 05/06/20 1033      Check-In   Supervising physician immediately available to respond to emergencies See telemetry face sheet for immediately available ER MD    Location [facility]-Cardiac & Pulmonary Rehab    Staff Present Renita Papa, RN BSN;Joseph [address] [place], [place], ACSM CEP, Exercise Physiologist    Virtual Visit No    Medication changes reported     No    Fall or balance concerns reported    No    Warm-up and Cool-down Performed on first and last piece of equipment    Resistance Training Performed Yes    VAD Patient? No    PAD/SET Patient? No      Pain Assessment   Currently in Pain? No/denies              Social History   Tobacco Use  Smoking Status Never Smoker  Smokeless Tobacco Never Used    Goals Met:  Independence with exercise equipment Exercise tolerated well No report of cardiac concerns or symptoms Strength training completed today  Goals Unmet:  Not Applicable  Comments: Pt able to follow exercise prescription today without complaint.  Will continue to monitor for progression.    Dr. Emily Filbert is Medical Director for [place] and [facility].

## 2020-05-08 ENCOUNTER — Other Ambulatory Visit: Payer: Self-pay

## 2020-05-08 DIAGNOSIS — J9611 Chronic respiratory failure with hypoxia: Secondary | ICD-10-CM

## 2020-05-08 NOTE — Progress Notes (Signed)
Daily Session Note  Patient Details  Name: Mackenzie Walsh MRN: [id number] Date of Birth: Jan 10, 1932 Referring Provider:     Pulmonary Rehab from 03/12/2020 in [facility] Cardiac and Pulmonary Rehab  Referring Provider Lanney Gins, Fraud MD      Encounter Date: 05/08/2020  Check In:  Session Check In - 05/08/20 0946      Check-In   Supervising physician immediately available to respond to emergencies See telemetry face sheet for immediately available ER MD    Location [facility]-Cardiac & Pulmonary Rehab    Staff Present Birdie Sons, MPA, Elveria Rising, BA, ACSM CEP, Exercise Physiologist;Joseph Tessie Fass RCP,RRT,BSRT    Virtual Visit No    Medication changes reported     No    Fall or balance concerns reported    No    Warm-up and Cool-down Performed on first and last piece of equipment    Resistance Training Performed Yes    VAD Patient? No    PAD/SET Patient? No      Pain Assessment   Currently in Pain? No/denies              Social History   Tobacco Use  Smoking Status Never Smoker  Smokeless Tobacco Never Used    Goals Met:  Independence with exercise equipment Exercise tolerated well No report of cardiac concerns or symptoms Strength training completed today  Goals Unmet:  Not Applicable  Comments: Pt able to follow exercise prescription today without complaint.  Will continue to monitor for progression.    Dr. Emily Filbert is Medical Director for [place] and [facility].

## 2020-05-10 ENCOUNTER — Other Ambulatory Visit: Payer: Self-pay

## 2020-05-10 DIAGNOSIS — J9611 Chronic respiratory failure with hypoxia: Secondary | ICD-10-CM

## 2020-05-10 NOTE — Progress Notes (Signed)
Daily Session Note  Patient Details  Name: Mackenzie Walsh MRN: [id number] Date of Birth: Jun 02, 1932 Referring Provider:     Pulmonary Rehab from 03/12/2020 in [facility] Cardiac and Pulmonary Rehab  Referring Provider Lanney Gins, Fraud MD      Encounter Date: 05/10/2020  Check In:  Session Check In - 05/10/20 0956      Check-In   Supervising physician immediately available to respond to emergencies See telemetry face sheet for immediately available ER MD    Location [facility]-Cardiac & Pulmonary Rehab    Staff Present Birdie Sons, MPA, RN;Joseph Darrin Nipper, [place], RCEP, CCRP, CCET    Virtual Visit No    Medication changes reported     No    Fall or balance concerns reported    No    Warm-up and Cool-down Performed on first and last piece of equipment    Resistance Training Performed Yes    VAD Patient? No    PAD/SET Patient? No      Pain Assessment   Currently in Pain? No/denies              Social History   Tobacco Use  Smoking Status Never Smoker  Smokeless Tobacco Never Used    Goals Met:  Independence with exercise equipment Exercise tolerated well No report of cardiac concerns or symptoms Strength training completed today  Goals Unmet:  Not Applicable  Comments: Pt able to follow exercise prescription today without complaint.  Will continue to monitor for progression.    Dr. Emily Filbert is Medical Director for [place] and [facility].

## 2020-05-13 ENCOUNTER — Other Ambulatory Visit: Payer: Self-pay

## 2020-05-13 ENCOUNTER — Encounter: Payer: Medicare Other | Admitting: *Deleted

## 2020-05-13 DIAGNOSIS — J9611 Chronic respiratory failure with hypoxia: Secondary | ICD-10-CM

## 2020-05-13 NOTE — Progress Notes (Signed)
Daily Session Note  Patient Details  Name: Mackenzie Walsh MRN: [id number] Date of Birth: 10-19-1931 Referring Provider:     Pulmonary Rehab from 03/12/2020 in [facility] Cardiac and Pulmonary Rehab  Referring Provider Lanney Gins, Fraud MD      Encounter Date: 05/13/2020  Check In:  Session Check In - 05/13/20 0954      Check-In   Supervising physician immediately available to respond to emergencies See telemetry face sheet for immediately available ER MD    Location [facility]-Cardiac & Pulmonary Rehab    Staff Present [name], RN, BSN, CCRP;Joseph Hood RCP,RRT,BSRT;Kelly [place], [place], ACSM CEP, Exercise Physiologist    Virtual Visit No    Medication changes reported     No    Fall or balance concerns reported    No    Warm-up and Cool-down Performed on first and last piece of equipment    Resistance Training Performed Yes    VAD Patient? No    PAD/SET Patient? No      Pain Assessment   Currently in Pain? No/denies              Social History   Tobacco Use  Smoking Status Never Smoker  Smokeless Tobacco Never Used    Goals Met:  Independence with exercise equipment Exercise tolerated well No report of cardiac concerns or symptoms  Goals Unmet:  Not Applicable  Comments: Pt able to follow exercise prescription today without complaint.  Will continue to monitor for progression.    Dr. Emily Filbert is Medical Director for [place] and [facility].

## 2020-05-15 ENCOUNTER — Other Ambulatory Visit: Payer: Self-pay

## 2020-05-15 DIAGNOSIS — J9611 Chronic respiratory failure with hypoxia: Secondary | ICD-10-CM

## 2020-05-15 NOTE — Progress Notes (Signed)
Daily Session Note  Patient Details  Name: Mackenzie Walsh MRN: [id number] Date of Birth: 03/15/1932 Referring Provider:     Pulmonary Rehab from 03/12/2020 in [facility] Cardiac and Pulmonary Rehab  Referring Provider Lanney Gins, Fraud MD      Encounter Date: 05/15/2020  Check In:  Session Check In - 05/15/20 1012      Check-In   Supervising physician immediately available to respond to emergencies See telemetry face sheet for immediately available ER MD    Location [facility]-Cardiac & Pulmonary Rehab    Staff Present Birdie Sons, MPA, Elveria Rising, BA, ACSM CEP, Exercise Physiologist;[name], MS Exercise Physiologist    Virtual Visit No    Medication changes reported     No    Fall or balance concerns reported    No    Warm-up and Cool-down Performed on first and last piece of equipment    Resistance Training Performed Yes    VAD Patient? No    PAD/SET Patient? No      Pain Assessment   Currently in Pain? No/denies              Social History   Tobacco Use  Smoking Status Never Smoker  Smokeless Tobacco Never Used    Goals Met:  Independence with exercise equipment Exercise tolerated well No report of cardiac concerns or symptoms Strength training completed today  Goals Unmet:  Not Applicable  Comments: Pt able to follow exercise prescription today without complaint.  Will continue to monitor for progression.    Dr. Emily Filbert is Medical Director for [place] and [facility].

## 2020-05-20 ENCOUNTER — Other Ambulatory Visit: Payer: Self-pay

## 2020-05-20 DIAGNOSIS — J9611 Chronic respiratory failure with hypoxia: Secondary | ICD-10-CM | POA: Diagnosis not present

## 2020-05-20 NOTE — Progress Notes (Signed)
Daily Session Note  Patient Details  Name: Mackenzie Walsh MRN: [id number] Date of Birth: 08-01-1931 Referring Provider:     Pulmonary Rehab from 03/12/2020 in [facility] Cardiac and Pulmonary Rehab  Referring Provider Lanney Gins, Fraud MD      Encounter Date: 05/20/2020  Check In:  Session Check In - 05/20/20 0943      Check-In   Supervising physician immediately available to respond to emergencies See telemetry face sheet for immediately available ER MD    Location [facility]-Cardiac & Pulmonary Rehab    Staff Present Birdie Sons, MPA, Mauricia Area, BS, ACSM CEP, Exercise Physiologist;Joseph Tessie Fass RCP,RRT,BSRT    Virtual Visit No    Medication changes reported     No    Fall or balance concerns reported    No    Warm-up and Cool-down Performed on first and last piece of equipment    Resistance Training Performed Yes    VAD Patient? No    PAD/SET Patient? No      Pain Assessment   Currently in Pain? No/denies              Social History   Tobacco Use  Smoking Status Never Smoker  Smokeless Tobacco Never Used    Goals Met:  Independence with exercise equipment Exercise tolerated well No report of cardiac concerns or symptoms Strength training completed today  Goals Unmet:  Not Applicable  Comments: Pt able to follow exercise prescription today without complaint.  Will continue to monitor for progression.    Dr. Emily Filbert is Medical Director for [place] and [facility].

## 2020-05-22 ENCOUNTER — Other Ambulatory Visit: Payer: Self-pay

## 2020-05-22 ENCOUNTER — Encounter: Payer: Self-pay | Admitting: *Deleted

## 2020-05-22 ENCOUNTER — Encounter: Payer: Medicare Other | Attending: Pulmonary Disease

## 2020-05-22 DIAGNOSIS — J9611 Chronic respiratory failure with hypoxia: Secondary | ICD-10-CM

## 2020-05-22 NOTE — Progress Notes (Signed)
Daily Session Note  Patient Details  Name: Mackenzie Walsh MRN: [id number] Date of Birth: 1931-11-01 Referring Provider:     Pulmonary Rehab from 03/12/2020 in [facility] Cardiac and Pulmonary Rehab  Referring Provider Lanney Gins, Fraud MD      Encounter Date: 05/22/2020  Check In:  Session Check In - 05/22/20 0954      Check-In   Supervising physician immediately available to respond to emergencies See telemetry face sheet for immediately available ER MD    Location [facility]-Cardiac & Pulmonary Rehab    Staff Present Birdie Sons, MPA, Elveria Rising, BA, ACSM CEP, Exercise Physiologist;[name], MS Exercise Physiologist    Virtual Visit No    Medication changes reported     No    Fall or balance concerns reported    No    Warm-up and Cool-down Performed on first and last piece of equipment    Resistance Training Performed Yes    VAD Patient? No    PAD/SET Patient? No      Pain Assessment   Currently in Pain? No/denies              Social History   Tobacco Use  Smoking Status Never Smoker  Smokeless Tobacco Never Used    Goals Met:  Independence with exercise equipment Exercise tolerated well No report of cardiac concerns or symptoms Strength training completed today  Goals Unmet:  Not Applicable  Comments: Pt able to follow exercise prescription today without complaint.  Will continue to monitor for progression.    Dr. Emily Filbert is Medical Director for [place] and [facility].

## 2020-05-22 NOTE — Progress Notes (Signed)
Pulmonary Individual Treatment Plan  Patient Details  Name: FAYLINN SCHWENN MRN: [id number] Date of Birth: Aug 19, 1931 Referring Provider:     Pulmonary Rehab from 03/12/2020 in [facility] Cardiac and Pulmonary Rehab  Referring Provider Lanney Gins, Fraud MD      Initial Encounter Date:    Pulmonary Rehab from 03/12/2020 in [facility] Cardiac and Pulmonary Rehab  Date 03/12/20      Visit Diagnosis: Chronic respiratory failure with hypoxia ([place])  Patient's Home Medications on Admission:  Current Outpatient Medications:  .  Acetaminophen 500 MG capsule, Take 2 capsules (1,000 mg total) by mouth 3 times/day as needed-between meals & bedtime., Disp: 30 capsule, Rfl:  .  albuterol (PROVENTIL) (2.5 MG/3ML) 0.083% nebulizer solution, Take 3 mLs (2.5 mg total) by nebulization every 4 (four) hours as needed for wheezing or shortness of breath., Disp: 75 mL, Rfl: 2 .  amiodarone (PACERONE) 200 MG tablet, Take 200 mg by mouth daily. , Disp: , Rfl:  .  bifidobacterium infantis (ALIGN) capsule, Take 1 capsule by mouth daily., Disp: , Rfl:  .  budesonide (PULMICORT) 0.5 MG/2ML nebulizer solution, Inhale 2 mLs into the lungs 2 (two) times daily. (Patient not taking: Reported on 03/11/2020), Disp: , Rfl:  .  CALCIUM CITRATE PO, Take 600 mg by mouth 2 (two) times daily as needed., Disp: , Rfl:  .  carvedilol (COREG) 12.5 MG tablet, Take 12.5 mg by mouth 2 (two) times daily with a meal. , Disp: , Rfl:  .  CITRACAL MAXIMUM 315-250 MG-UNIT TABS, , Disp: , Rfl:  .  Coenzyme Q10 (COQ-10) 100 MG CAPS, Take 100 mg by mouth daily. , Disp: , Rfl:  .  fluticasone (FLONASE) 50 MCG/ACT nasal spray, Place 1-2 sprays into both nostrils daily. , Disp: , Rfl:  .  furosemide (LASIX) 40 MG tablet, Take 20 mg by mouth daily., Disp: , Rfl:  .  gabapentin (NEURONTIN) 300 MG capsule, Take 300 mg by mouth 3 (three) times daily. , Disp: , Rfl:  .  levothyroxine (SYNTHROID) 112 MCG tablet, Take 112 mcg by mouth daily before breakfast. ,  Disp: , Rfl:  .  losartan (COZAAR) 25 MG tablet, Take 25 mg by mouth daily. , Disp: , Rfl:  .  Magnesium Oxide 200 MG TABS, Take 200 mg by mouth daily. , Disp: , Rfl:  .  Multiple Vitamin (MULTI-VITAMIN DAILY PO), Take 1 tablet by mouth daily.  (Patient not taking: Reported on 03/11/2020), Disp: , Rfl:  .  omeprazole (PRILOSEC) 40 MG capsule, Take 40 mg by mouth daily., Disp: , Rfl:  .  Rivaroxaban (XARELTO) 15 MG TABS tablet, Take 15 mg by mouth daily with supper. , Disp: , Rfl:  .  simvastatin (ZOCOR) 10 MG tablet, Take 10 mg by mouth at bedtime., Disp: , Rfl:  .  spironolactone (ALDACTONE) 25 MG tablet, Take 12.5 mg by mouth daily. , Disp: , Rfl:   Past Medical History: Past Medical History:  Diagnosis Date  . A-fib ([place])   . Allergy   . Anemia   . Arrhythmia   . Atrial fibrillation ([place])   . Broken femur ([place]) 2017  . Cataract   . Celiac disease   . Chronic pain syndrome   . Chronic systolic CHF (congestive heart failure) ([place])   . CKD (chronic kidney disease)   . COPD (chronic obstructive pulmonary disease) ([place])   . Dilated cardiomyopathy ([place])   . Hyperlipidemia   . Hypertension   . Migraines   . Mitral valve  regurgitation   . Osteoporosis   . Posterior rhinorrhea   . Scoliosis deformity of spine   . Senile hyperkeratosis   . Thyroid disease    hypothyroid  . Ventricular tachyarrhythmia ([facility])     Tobacco Use: Social History   Tobacco Use  Smoking Status Never Smoker  Smokeless Tobacco Never Used    Labs: Recent Review Flowsheet Data   There is no flowsheet data to display.      Pulmonary Assessment Scores:  Pulmonary Assessment Scores    Row Name 03/12/20 1129         ADL UCSD   ADL Phase Entry     SOB Score total 61     Rest 0     Walk 3     Stairs 5     Bath 2     Dress 2     Shop 3       CAT Score   CAT Score 12       mMRC Score   mMRC Score 3            UCSD: Self-administered rating of dyspnea associated with activities of  daily living (ADLs) 6-point scale (0 = "not at all" to 5 = "maximal or unable to do because of breathlessness")  Scoring Scores range from 0 to 120.  Minimally important difference is 5 units  CAT: CAT can identify the health impairment of COPD patients and is better correlated with disease progression.  CAT has a scoring range of zero to 40. The CAT score is classified into four groups of low (less than 10), medium (10 - 20), high (21-30) and very high (31-40) based on the impact level of disease on health status. A CAT score over 10 suggests significant symptoms.  A worsening CAT score could be explained by an exacerbation, poor medication adherence, poor inhaler technique, or progression of COPD or comorbid conditions.  CAT MCID is 2 points  mMRC: mMRC (Modified Medical Research Council) Dyspnea Scale is used to assess the degree of baseline functional disability in patients of respiratory disease due to dyspnea. No minimal important difference is established. A decrease in score of 1 point or greater is considered a positive change.   Pulmonary Function Assessment:   Exercise Target Goals: Exercise Program Goal: Individual exercise prescription set using results from initial 6 min walk test and THRR while considering  patient's activity barriers and safety.   Exercise Prescription Goal: Initial exercise prescription builds to 30-45 minutes a day of aerobic activity, 2-3 days per week.  Home exercise guidelines will be given to patient during program as part of exercise prescription that the participant will acknowledge.  Education: Aerobic Exercise: - Group verbal and visual presentation on the components of exercise prescription. Introduces F.I.T.T principle from ACSM for exercise prescriptions.  Reviews F.I.T.T. principles of aerobic exercise including progression. Written material given at graduation.   Pulmonary Rehab from 05/15/2020 in [facility] Cardiac and Pulmonary Rehab  Date  04/17/20  Educator [facility]  Instruction Review Code 1- Verbalizes Understanding      Education: Resistance Exercise: - Group verbal and visual presentation on the components of exercise prescription. Introduces F.I.T.T principle from ACSM for exercise prescriptions  Reviews F.I.T.T. principles of resistance exercise including progression. Written material given at graduation.    Education: Exercise & Equipment Safety: - Individual verbal instruction and demonstration of equipment use and safety with use of the equipment.   Pulmonary Rehab from 05/15/2020 in [facility] Cardiac and Pulmonary  Rehab  Date 03/12/20  Educator [facility]  Instruction Review Code 1- Verbalizes Understanding      Education: Exercise Physiology & General Exercise Guidelines: - Group verbal and written instruction with models to review the exercise physiology of the cardiovascular system and associated critical values. Provides general exercise guidelines with specific guidelines to those with heart or lung disease.    Pulmonary Rehab from 05/15/2020 in [facility] Cardiac and Pulmonary Rehab  Date 04/03/20  Educator [facility]  Instruction Review Code 1- Verbalizes Understanding      Education: Flexibility, Balance, Mind/Body Relaxation: - Group verbal and visual presentation with interactive activity on the components of exercise prescription. Introduces F.I.T.T principle from ACSM for exercise prescriptions. Reviews F.I.T.T. principles of flexibility and balance exercise training including progression. Also discusses the mind body connection.  Reviews various relaxation techniques to help reduce and manage stress (i.e. Deep breathing, progressive muscle relaxation, and visualization). Balance handout provided to take home. Written material given at graduation.   Pulmonary Rehab from 05/15/2020 in [facility] Cardiac and Pulmonary Rehab  Date 04/24/20  Educator [facility]  Instruction Review Code 1- Verbalizes Understanding      Activity Barriers & Risk  Stratification:  Activity Barriers & Cardiac Risk Stratification - 03/12/20 1122      Activity Barriers & Cardiac Risk Stratification   Activity Barriers Right Knee Replacement;Other (comment);Neck/Spine Problems;Shortness of Breath;Deconditioning;Muscular Weakness;Back Problems;Balance Concerns;Assistive Device;History of Falls    Comments Rod in femur, scoliosis, spinal stenosis, marginal vision in left eye, limited ROM from ICD, kyphosis, scoliosis           6 Minute Walk:  6 Minute Walk    Row Name 03/12/20 1120         6 Minute Walk   Phase Initial     Distance 410 feet     Walk Time 4.32 minutes     # of Rest Breaks 3  7 sec, 50 sec, 32 sec     MPH 1.08     METS 0.52     RPE 17     Perceived Dyspnea  4     VO2 Peak 1.82     Symptoms Yes (comment)     Comments SOB     Resting HR 81 bpm     Resting BP 130/64     Resting Oxygen Saturation  94 %     Exercise Oxygen Saturation  during 6 min walk 89 %     Max Ex. HR 95 bpm     Max Ex. BP 134/66     2 Minute Post BP 128/64       Interval HR   1 Minute HR 95     2 Minute HR 90     3 Minute HR 85     4 Minute HR 89     5 Minute HR 83     6 Minute HR 94     2 Minute Post HR 80     Interval Heart Rate? Yes       Interval Oxygen   Interval Oxygen? Yes     Baseline Oxygen Saturation % 94 %     1 Minute Oxygen Saturation % 92 %     1 Minute Liters of Oxygen 0 L  Room Air     2 Minute Oxygen Saturation % 89 %     2 Minute Liters of Oxygen 0 L     3 Minute Oxygen Saturation % 92 %     3  Minute Liters of Oxygen 0 L     4 Minute Oxygen Saturation % 90 %     4 Minute Liters of Oxygen 0 L     5 Minute Oxygen Saturation % 91 %     5 Minute Liters of Oxygen 0 L     6 Minute Oxygen Saturation % 89 %     6 Minute Liters of Oxygen 0 L     2 Minute Post Oxygen Saturation % 94 %     2 Minute Post Liters of Oxygen 0 L           Oxygen Initial Assessment:  Oxygen Initial Assessment - 03/12/20 1129      Home Oxygen    Home Oxygen Device Home Concentrator;E-Tanks    Sleep Oxygen Prescription Continuous    Liters per minute 2    Home Exercise Oxygen Prescription None    Home Resting Oxygen Prescription None    Compliance with Home Oxygen Use Yes      Initial 6 min Walk   Oxygen Used None      Program Oxygen Prescription   Program Oxygen Prescription None      Intervention   Short Term Goals To learn and exhibit compliance with exercise, home and travel O2 prescription;To learn and understand importance of monitoring SPO2 with pulse oximeter and demonstrate accurate use of the pulse oximeter.;To learn and understand importance of maintaining oxygen saturations>88%;To learn and demonstrate proper pursed lip breathing techniques or other breathing techniques.;To learn and demonstrate proper use of respiratory medications    Long  Term Goals Exhibits compliance with exercise, home and travel O2 prescription;Verbalizes importance of monitoring SPO2 with pulse oximeter and return demonstration;Maintenance of O2 saturations>88%;Exhibits proper breathing techniques, such as pursed lip breathing or other method taught during program session;Compliance with respiratory medication;Demonstrates proper use of MDI's           Oxygen Re-Evaluation:  Oxygen Re-Evaluation    Row Name 04/10/20 1016 04/29/20 1019           Program Oxygen Prescription   Program Oxygen Prescription None None        Home Oxygen   Home Oxygen Device Home Concentrator;E-Tanks Home Concentrator;E-Tanks      Sleep Oxygen Prescription Continuous Continuous      Liters per minute 2 2      Home Exercise Oxygen Prescription None None      Home Resting Oxygen Prescription None None      Compliance with Home Oxygen Use Yes Yes        Goals/Expected Outcomes   Short Term Goals To learn and demonstrate proper pursed lip breathing techniques or other breathing techniques.;To learn and exhibit compliance with exercise, home and travel O2  prescription To learn and demonstrate proper pursed lip breathing techniques or other breathing techniques.;To learn and exhibit compliance with exercise, home and travel O2 prescription      Long  Term Goals Exhibits compliance with exercise, home and travel O2 prescription;Exhibits proper breathing techniques, such as pursed lip breathing or other method taught during program session Exhibits compliance with exercise, home and travel O2 prescription;Exhibits proper breathing techniques, such as pursed lip breathing or other method taught during program session      Comments Informed patient how to perform the Pursed Lipped breathing technique. Told patient to Inhale through the nose and out the mouth with pursed lips to keep their airways open, help oxygenate them better, practice when at rest or doing strenuous  activity. Patient Verbalizes understanding of technique and will work on and be reiterated during [facility]. Doesn't normally use oxygen tank and was told she doesn't need it. She is still using the home concentrator. Reviewed PLB techniques and encouraged her to exercise at home and continue to attend rehab to improve breathing.      Goals/Expected Outcomes Short: use PLB with exertion. Long: use PLB on exertion proficiently and independently. Short: use PLB with exertion, excercise at home, attend rehab. Long: use PLB on exertion proficiently and independently.             Oxygen Discharge (Final Oxygen Re-Evaluation):  Oxygen Re-Evaluation - 04/29/20 1019      Program Oxygen Prescription   Program Oxygen Prescription None      Home Oxygen   Home Oxygen Device Home Concentrator;E-Tanks    Sleep Oxygen Prescription Continuous    Liters per minute 2    Home Exercise Oxygen Prescription None    Home Resting Oxygen Prescription None    Compliance with Home Oxygen Use Yes      Goals/Expected Outcomes   Short Term Goals To learn and demonstrate proper pursed lip breathing techniques  or other breathing techniques.;To learn and exhibit compliance with exercise, home and travel O2 prescription    Long  Term Goals Exhibits compliance with exercise, home and travel O2 prescription;Exhibits proper breathing techniques, such as pursed lip breathing or other method taught during program session    Comments Doesn't normally use oxygen tank and was told she doesn't need it. She is still using the home concentrator. Reviewed PLB techniques and encouraged her to exercise at home and continue to attend rehab to improve breathing.    Goals/Expected Outcomes Short: use PLB with exertion, excercise at home, attend rehab. Long: use PLB on exertion proficiently and independently.           Initial Exercise Prescription:  Initial Exercise Prescription - 03/12/20 1100      Date of Initial Exercise RX and Referring Provider   Date 03/12/20    Referring Provider Karna Christmas, Fraud MD      Treadmill   MPH 0.7    Grade 0    Minutes 15    METs 1.5      Recumbant Bike   Level 1    RPM 50    Watts 5    Minutes 15    METs 1.5      NuStep   Level 1    SPM 80    Minutes 15    METs 1.5      REL-XR   Level 1    Speed 50    Minutes 15    METs 1.5      Prescription Details   Frequency (times per week) 3    Duration Progress to 30 minutes of continuous aerobic without signs/symptoms of physical distress      Intensity   THRR 40-80% of Max Heartrate 102-123    Ratings of Perceived Exertion 11-13    Perceived Dyspnea 0-4      Progression   Progression Continue to progress workloads to maintain intensity without signs/symptoms of physical distress.      Resistance Training   Training Prescription Yes    Weight 2 lb    Reps 10-15           Perform Capillary Blood Glucose checks as needed.  Exercise Prescription Changes:  Exercise Prescription Changes    Row Name 03/12/20 1100 03/13/20 1600 03/28/20  0700 04/10/20 1300 04/24/20 1500     Response to Exercise   Blood  Pressure (Admit) 130/64 84/58 98/64  132/74 102/58   Blood Pressure (Exercise) 134/66 102/78 122/70 120/66 112/66   Blood Pressure (Exit) 128/64 102/58 118/72 106/60 100/58   Heart Rate (Admit) 81 bpm 80 bpm 80 bpm 76 bpm 80 bpm   Heart Rate (Exercise) 95 bpm 58 bpm 81 bpm 72 bpm 80 bpm   Heart Rate (Exit) 79 bpm 80 bpm 80 bpm 79 bpm 90 bpm   Oxygen Saturation (Admit) 94 % 93 % 95 % 90 % 90 %   Oxygen Saturation (Exercise) 89 % 84 % 91 % 89 % 92 %   Oxygen Saturation (Exit) 94 % 92 % 95 % 92 % 90 %   Rating of Perceived Exertion (Exercise) 17 11 12 12 13    Perceived Dyspnea (Exercise) 4 -- 2 2 1    Symptoms SOB -- SOb -- SOB   Comments walk test results first day -- -- --   Duration -- Progress to 30 minutes of  aerobic without signs/symptoms of physical distress Continue with 30 min of aerobic exercise without signs/symptoms of physical distress. Continue with 30 min of aerobic exercise without signs/symptoms of physical distress. Continue with 30 min of aerobic exercise without signs/symptoms of physical distress.   Intensity -- THRR unchanged THRR unchanged THRR unchanged THRR unchanged     Progression   Progression -- Continue to progress workloads to maintain intensity without signs/symptoms of physical distress. Continue to progress workloads to maintain intensity without signs/symptoms of physical distress. Continue to progress workloads to maintain intensity without signs/symptoms of physical distress. Continue to progress workloads to maintain intensity without signs/symptoms of physical distress.   Average METs -- 1.2 1.2 1.2 1.4     Resistance Training   Training Prescription -- Yes Yes Yes Yes   Weight -- 2 lb 2 lb 2 lb 2 lb   Reps -- 10-15 10-15 10-15 10-15     Interval Training   Interval Training -- -- No No No     Treadmill   MPH -- -- -- -- 0.7   Grade -- -- -- -- 0   Minutes -- -- -- -- 15   METs -- -- -- -- 1.5     Recumbant Bike   Level -- -- 1 -- --   Minutes --  -- 15 -- --     NuStep   Level -- 1 1 3 3    SPM -- 80 -- 80 --   Minutes -- 15 15 15 30    METs -- 1.2 1.3 1.2 1.4     Biostep-RELP   Level -- -- 1 -- --   Minutes -- -- 15 -- --   METs -- -- 1 -- --     Home Exercise Plan   Plans to continue exercise at -- -- -- -- Home (comment)  walking   Frequency -- -- -- -- Add 1 additional day to program exercise sessions.   Initial Home Exercises Provided -- -- -- -- 04/22/20   Row Name 05/08/20 1300             Response to Exercise   Blood Pressure (Admit) 118/60       Blood Pressure (Exercise) 124/62       Blood Pressure (Exit) 102/58       Heart Rate (Admit) 96 bpm       Heart Rate (Exercise) 80 bpm  Heart Rate (Exit) 80 bpm       Oxygen Saturation (Admit) 94 %       Oxygen Saturation (Exercise) 93 %       Oxygen Saturation (Exit) 94 %       Rating of Perceived Exertion (Exercise) 12       Perceived Dyspnea (Exercise) 3       Symptoms SOB       Duration Continue with 30 min of aerobic exercise without signs/symptoms of physical distress.       Intensity THRR unchanged         Progression   Progression Continue to progress workloads to maintain intensity without signs/symptoms of physical distress.       Average METs 1.5         Resistance Training   Training Prescription Yes       Weight 2 lb       Reps 10-15         Interval Training   Interval Training No         NuStep   Level 3       SPM 80       Minutes 15       METs 1.5              Exercise Comments:   Exercise Goals and Review:  Exercise Goals    Row Name 03/12/20 1125             Exercise Goals   Increase Physical Activity Yes       Intervention Provide advice, education, support and counseling about physical activity/exercise needs.;Develop an individualized exercise prescription for aerobic and resistive training based on initial evaluation findings, risk stratification, comorbidities and participant's personal goals.       Expected  Outcomes Short Term: Attend rehab on a regular basis to increase amount of physical activity.;Long Term: Add in home exercise to make exercise part of routine and to increase amount of physical activity.;Long Term: Exercising regularly at least 3-5 days a week.       Increase Strength and Stamina Yes       Intervention Provide advice, education, support and counseling about physical activity/exercise needs.;Develop an individualized exercise prescription for aerobic and resistive training based on initial evaluation findings, risk stratification, comorbidities and participant's personal goals.       Expected Outcomes Short Term: Increase workloads from initial exercise prescription for resistance, speed, and METs.;Long Term: Improve cardiorespiratory fitness, muscular endurance and strength as measured by increased METs and functional capacity ( );Short Term: Perform resistance training exercises routinely during rehab and add in resistance training at home       Able to understand and use rate of perceived exertion (RPE) scale Yes       Intervention Provide education and explanation on how to use RPE scale       Expected Outcomes Short Term: Able to use RPE daily in rehab to express subjective intensity level;Long Term:  Able to use RPE to guide intensity level when exercising independently       Able to understand and use Dyspnea scale Yes       Intervention Provide education and explanation on how to use Dyspnea scale       Expected Outcomes Short Term: Able to use Dyspnea scale daily in rehab to express subjective sense of shortness of breath during exertion;Long Term: Able to use Dyspnea scale to guide intensity level when exercising independently  Knowledge and understanding of Target Heart Rate Range (THRR) Yes       Intervention Provide education and explanation of THRR including how the numbers were predicted and where they are located for reference       Expected Outcomes Short Term:  Able to state/look up THRR;Short Term: Able to use daily as guideline for intensity in rehab;Long Term: Able to use THRR to govern intensity when exercising independently       Able to check pulse independently Yes       Intervention Provide education and demonstration on how to check pulse in carotid and radial arteries.;Review the importance of being able to check your own pulse for safety during independent exercise       Expected Outcomes Short Term: Able to explain why pulse checking is important during independent exercise;Long Term: Able to check pulse independently and accurately       Understanding of Exercise Prescription Yes       Intervention Provide education, explanation, and written materials on patient's individual exercise prescription       Expected Outcomes Short Term: Able to explain program exercise prescription;Long Term: Able to explain home exercise prescription to exercise independently              Exercise Goals Re-Evaluation :  Exercise Goals Re-Evaluation    Row Name 03/13/20 1140 03/28/20 0742 04/10/20 1021 04/10/20 1333 04/22/20 1307     Exercise Goal Re-Evaluation   Exercise Goals Review Increase Physical Activity;Able to understand and use rate of perceived exertion (RPE) scale;Understanding of Exercise Prescription;Knowledge and understanding of Target Heart Rate Range (THRR);Increase Strength and Stamina;Able to understand and use Dyspnea scale Increase Physical Activity;Increase Strength and Stamina;Understanding of Exercise Prescription Increase Physical Activity;Increase Strength and Stamina Increase Physical Activity;Increase Strength and Stamina Increase Physical Activity;Increase Strength and Stamina   Comments Reviewed RPE and dyspnea scales, THR and program prescription with pt today.  Pt voiced understanding and was given a copy of goals to take home. Alaa is off to a good start in rehab.  She uses a towel to help cushion her back on the bike and the  NuStep.  We will continue to work with her progression. Patient has been walking the halls in her apartment for exercise. She has yet to be informed of home exercise. She has been doing well in [facility] and has been able to increase some of her workloads. She has a rod in her left leg and sometimes it inhibits her from doing other exercises. Patient has been walking the halls in her apartment for exercise. She has yet to be informed of home exercise. She has been doing well in [facility] and has been able to increase some of her workloads. She has a rod in her left leg and sometimes it inhibits her from doing other exercises. Reviewed home exercise with pt today.  Pt plans to wak and use videos at home for exercise.  Reviewed THR, pulse, RPE, sign and symptoms, pulse oximetery and when to call 911 or MD.  Also discussed weather considerations and indoor options.  Pt voiced understanding.   Expected Outcomes Short: Use RPE daily to regulate intensity. Long: Follow program prescription in THR. Short: start to increase workloads Long: Continue follow program prescription Short: continue to walk at home and talk to EP about home exercise. Long: maintain an exercise routine at home independently pose [facility]. Short: continue to walk at home and talk to EP about home exercise. Long: maintain an exercise  routine at home independently pose [facility]. Short: add one day of exercise outside program sessions   Row Name 04/24/20 1528 04/29/20 1000 05/08/20 1351         Exercise Goal Re-Evaluation   Exercise Goals Review Increase Physical Activity;Increase Strength and Stamina;Understanding of Exercise Prescription Increase Physical Activity;Increase Strength and Stamina;Understanding of Exercise Prescription Increase Physical Activity;Increase Strength and Stamina;Understanding of Exercise Prescription     Comments Keila has been doing well in rehab.  She now on level 3 for the NuStep.  We will continue to monitor  her progress. She sometimes will walk around the apartment for a set amount of time. discussed more structured exercise at home. Discussed importance of exercising outside of rehab. she has access to a gym at her residence. Wants to go with her husband because he goes as well. Discussed PLB and warm up/cool down. Tiffanyann has moved up resistance to level 3 on T4.  We reviewed keeping SPM at 80-100 even with an increase in level to make sure you are improving.     Expected Outcomes Short: Continue to work on maintaining spm  Long: Continue to improve stamina. ST: walk 20 minutes of structured walking - will start at 5 minutes around the apartment 2x/week when not at rehab or use seated machines at gym for residents for at least 20 minutes. LT: exercise aerobically at least 20 minutes 5x/week. Resistance at least 2x/week. Short:  continue to exercise consistently Long: build overall stamina            Discharge Exercise Prescription (Final Exercise Prescription Changes):  Exercise Prescription Changes - 05/08/20 1300      Response to Exercise   Blood Pressure (Admit) 118/60    Blood Pressure (Exercise) 124/62    Blood Pressure (Exit) 102/58    Heart Rate (Admit) 96 bpm    Heart Rate (Exercise) 80 bpm    Heart Rate (Exit) 80 bpm    Oxygen Saturation (Admit) 94 %    Oxygen Saturation (Exercise) 93 %    Oxygen Saturation (Exit) 94 %    Rating of Perceived Exertion (Exercise) 12    Perceived Dyspnea (Exercise) 3    Symptoms SOB    Duration Continue with 30 min of aerobic exercise without signs/symptoms of physical distress.    Intensity THRR unchanged      Progression   Progression Continue to progress workloads to maintain intensity without signs/symptoms of physical distress.    Average METs 1.5      Resistance Training   Training Prescription Yes    Weight 2 lb    Reps 10-15      Interval Training   Interval Training No      NuStep   Level 3    SPM 80    Minutes 15    METs 1.5            Nutrition:  Target Goals: Understanding of nutrition guidelines, daily intake of sodium 1500mg , cholesterol 200mg , calories 30% from fat and 7% or less from saturated fats, daily to have 5 or more servings of fruits and vegetables.  Education: All About Nutrition: -Group instruction provided by verbal, written material, interactive activities, discussions, models, and posters to present general guidelines for heart healthy nutrition including fat, fiber, MyPlate, the role of sodium in heart healthy nutrition, utilization of the nutrition label, and utilization of this knowledge for meal planning. Follow up email sent as well. Written material given at graduation.   Pulmonary Rehab from  05/15/2020 in [facility] Cardiac and Pulmonary Rehab  Date 05/01/20  Educator [facility]  Instruction Review Code 1- Verbalizes Understanding      Biometrics:  Pre Biometrics - 03/12/20 1126      Pre Biometrics   Height 5' 2.2" (1.58 m)    Weight 133 lb (60.3 kg)    BMI (Calculated) 24.17    Single Leg Stand 1.3 seconds            Nutrition Therapy Plan and Nutrition Goals:  Nutrition Therapy & Goals - 03/18/20 0916      Nutrition Therapy   Diet Pulmonary MNT    Protein (specify units) 75g    Fiber 25 grams    Whole Grain Foods 3 servings    Saturated Fats 12 max. grams    Fruits and Vegetables 5 servings/day    Sodium 1.5 grams      Personal Nutrition Goals   Nutrition Goal ST: Add protein food to breakfast consistently - egg or protein shake for example, eat more moist foods to help with swallowing LT: improve SOB    Comments No problems breathing during or after eating. Reports having trouble swallowing. Doesn't like spicy food. Appetite is somewhat lower than it has been. B: blueberries with dry cereal and milk w/ oj or an egg with toast and sausage with a banana every morning. L: Toasted sandwich with ham and cheese, slice of cheese with [place] breast and tomato with a side salad.  sometimes lunch from [employer] at [place]. S: fruit with some yogurt. D: dining hall - meat, starch, vegetables with salad. Drinks: OJ and decaf coffee, water and apple juice, decaf tea. Pt uses gluten free bread. Pt reports she has been losing some weight 138-133 in last few months. Discussed pulmonary medical nutrition therapy. Pt reported not not wanting to change much of what she is eating, is willing to add in more protein to breakfast.      Intervention Plan   Intervention Prescribe, educate and counsel regarding individualized specific dietary modifications aiming towards targeted core components such as weight, hypertension, lipid management, diabetes, heart failure and other comorbidities.;Nutrition handout(s) given to patient.    Expected Outcomes Short Term Goal: Understand basic principles of dietary content, such as calories, fat, sodium, cholesterol and nutrients.;Short Term Goal: A plan has been developed with personal nutrition goals set during dietitian appointment.;Long Term Goal: Adherence to prescribed nutrition plan.           Nutrition Assessments:  Nutrition Assessments - 03/12/20 1126      MEDFICTS Scores   Pre Score 67          MEDIFICTS Score Key:  ?70 Need to make dietary changes   40-70 Heart Healthy Diet  ? 40 Therapeutic Level Cholesterol Diet   Picture Your Plate Scores:  <33 Unhealthy dietary pattern with much room for improvement.  41-50 Dietary pattern unlikely to meet recommendations for good health and room for improvement.  51-60 More healthful dietary pattern, with some room for improvement.   >60 Healthy dietary pattern, although there may be some specific behaviors that could be improved.   Nutrition Goals Re-Evaluation:  Nutrition Goals Re-Evaluation    Row Name 04/03/20 1008 04/29/20 1013           Goals   Nutrition Goal ST: Add  protein shake on the mornings she does not eat eggs, continue to eat more moist foods to help  with swallowing LT: improve SOB ST: continue with current changes,  use swallowing difficulty techniques LT: swallow better, continue to meet protein needs      Comment She reports having an egg every other morning with some fruits. Will try a protien drink like ensure or boost. Worse time swallowing and will address it during her MD appointment tomorrow. She reports no changes at this time with her diet. She is having protein shake - doesn't know which - ensure? She still reports her swallowing is worse - she fell and hit her head on the floor and feels thats what is making it worse. Doesn't remember what MD said. reviewed techniques to help such as having moist foods.      Expected Outcome Have an easier time swallowing and meet protein needs. ST: continue with current changes, use swallowing difficulty techniques LT: swallow better, continue to meet protein needs             Nutrition Goals Discharge (Final Nutrition Goals Re-Evaluation):  Nutrition Goals Re-Evaluation - 04/29/20 1013      Goals   Nutrition Goal ST: continue with current changes, use swallowing difficulty techniques LT: swallow better, continue to meet protein needs    Comment She reports no changes at this time with her diet. She is having protein shake - doesn't know which - ensure? She still reports her swallowing is worse - she fell and hit her head on the floor and feels thats what is making it worse. Doesn't remember what MD said. reviewed techniques to help such as having moist foods.    Expected Outcome ST: continue with current changes, use swallowing difficulty techniques LT: swallow better, continue to meet protein needs           Psychosocial: Target Goals: Acknowledge presence or absence of significant depression and/or stress, maximize coping skills, provide positive support system. Participant is able to verbalize types and ability to use techniques and skills needed for reducing stress and depression.    Education: Stress, Anxiety, and Depression - Group verbal and visual presentation to define topics covered.  Reviews how body is impacted by stress, anxiety, and depression.  Also discusses healthy ways to reduce stress and to treat/manage anxiety and depression.  Written material given at graduation.   Pulmonary Rehab from 05/15/2020 in [facility] Cardiac and Pulmonary Rehab  Date 03/27/20  Educator [facility]  Instruction Review Code 1- Verbalizes Understanding      Education: Sleep Hygiene -Provides group verbal and written instruction about how sleep can affect your health.  Define sleep hygiene, discuss sleep cycles and impact of sleep habits. Review good sleep hygiene tips.    Initial Review & Psychosocial Screening:  Initial Psych Review & Screening - 03/11/20 1122      Initial Review   Current issues with Current Stress Concerns    Source of Stress Concerns Family    Comments Husband diagnosed with stage 1 memory loss      Family Dynamics   Good Support System? Yes   Son and daughter in law.  husband of 78     Barriers   Psychosocial barriers to participate in program There are no identifiable barriers or psychosocial needs.;The patient should benefit from training in stress management and relaxation.      Screening Interventions   Interventions Encouraged to exercise;To provide support and resources with identified psychosocial needs;Provide feedback about the scores to participant    Expected Outcomes Short Term goal: Utilizing psychosocial counselor, staff and physician to assist with identification of specific Stressors or current issues interfering  with healing process. Setting desired goal for each stressor or current issue identified.;Long Term Goal: Stressors or current issues are controlled or eliminated.;Short Term goal: Identification and review with participant of any Quality of Life or Depression concerns found by scoring the questionnaire.;Long Term goal: The participant  improves quality of Life and PHQ9 Scores as seen by post scores and/or verbalization of changes           Quality of Life Scores:  Scores of 19 and below usually indicate a poorer quality of life in these areas.  A difference of  2-3 points is a clinically meaningful difference.  A difference of 2-3 points in the total score of the Quality of Life Index has been associated with significant improvement in overall quality of life, self-image, physical symptoms, and general health in studies assessing change in quality of life.  PHQ-9: Recent Review Flowsheet Data    Depression screen [facility] 2/9 03/12/2020   Decreased Interest 1   Down, Depressed, Hopeless 0   PHQ - 2 Score 1   Altered sleeping 1   Tired, decreased energy 2   Change in appetite 0   Feeling bad or failure about yourself  0   Trouble concentrating 0   Moving slowly or fidgety/restless 0   Suicidal thoughts 0   PHQ-9 Score 4   Difficult doing work/chores Somewhat difficult     Interpretation of Total Score  Total Score Depression Severity:  1-4 = Minimal depression, 5-9 = Mild depression, 10-14 = Moderate depression, 15-19 = Moderately severe depression, 20-27 = Severe depression   Psychosocial Evaluation and Intervention:  Psychosocial Evaluation - 03/11/20 1132      Psychosocial Evaluation & Interventions   Interventions Encouraged to exercise with the program and follow exercise prescription    Comments Proscilla is coming to the program without any major barriers. She is concerned about her husband of 60 years that has been recently diagnosed with Stage 1 Memory Loss. They have a support system with their son and duaghter in law that live in [place] and they live in a continous care community and have all the resources there for them to utilize. She hope to be able to breath better after completing the program.    Expected Outcomes STG: Donnis will attend her scheduled sessions. LTG: Yesika will continue to  follow the exercise and eduction guidelines to maintain what she gained in the program.    Continue Psychosocial Services  Follow up required by staff           Psychosocial Re-Evaluation:  Psychosocial Re-Evaluation    Row Name 04/10/20 1020 04/29/20 1008           Psychosocial Re-Evaluation   Current issues with None Identified Current Anxiety/Panic;Current Stress Concerns      Comments Patient reports no issues with their current mental states, sleep, stress, depression or anxiety. Will follow up with patient in a few weeks for any changes. She reports anxiety with shortness of breath which she feels has gotten worse. She feels she is sleeping well and getting about 6-7 hours of sleep per night. She reports he husband has memory issues and has a caregiver most days of the week which causes her stress. She watches the news and musical programs, sits on balcony to reduce stress. Discussed other stress management. She reports having a good support system in her daughter in-law and son 30 minutes away and sees them at least 1x/week. She still drives.  Expected Outcomes Short: Continue to exercise regularly to support mental health and notify staff of any changes. Long: maintain mental health and well being through teaching of rehab or prescribed medications independently. ST; continue to exercise regularly to improve shortness of breath. Continue stress management techniques. LT: manage stress and anxiety, improve shortness of breath.      Interventions Encouraged to attend Pulmonary Rehabilitation for the exercise Encouraged to attend Pulmonary Rehabilitation for the exercise      Continue Psychosocial Services  Follow up required by staff Follow up required by staff      Comments -- Husband diagnosed with stage 1 memory loss, issues with shortness of breath, rod in her leg and back pain.        Initial Review   Source of Stress Concerns -- Family;Chronic Illness              Psychosocial Discharge (Final Psychosocial Re-Evaluation):  Psychosocial Re-Evaluation - 04/29/20 1008      Psychosocial Re-Evaluation   Current issues with Current Anxiety/Panic;Current Stress Concerns    Comments She reports anxiety with shortness of breath which she feels has gotten worse. She feels she is sleeping well and getting about 6-7 hours of sleep per night. She reports he husband has memory issues and has a caregiver most days of the week which causes her stress. She watches the news and musical programs, sits on balcony to reduce stress. Discussed other stress management. She reports having a good support system in her daughter in-law and son 30 minutes away and sees them at least 1x/week. She still drives.    Expected Outcomes ST; continue to exercise regularly to improve shortness of breath. Continue stress management techniques. LT: manage stress and anxiety, improve shortness of breath.    Interventions Encouraged to attend Pulmonary Rehabilitation for the exercise    Continue Psychosocial Services  Follow up required by staff    Comments Husband diagnosed with stage 1 memory loss, issues with shortness of breath, rod in her leg and back pain.      Initial Review   Source of Stress Concerns Family;Chronic Illness           Education: Education Goals: Education classes will be provided on a weekly basis, covering required topics. Participant will state understanding/return demonstration of topics presented.  Learning Barriers/Preferences:  Learning Barriers/Preferences - 03/11/20 1125      Learning Barriers/Preferences   Learning Barriers None    Learning Preferences None           General Pulmonary Education Topics:  Infection Prevention: - Provides verbal and written material to individual with discussion of infection control including proper hand washing and proper equipment cleaning during exercise session.   Pulmonary Rehab from 05/15/2020 in [facility]  Cardiac and Pulmonary Rehab  Date 03/12/20  Educator [facility]  Instruction Review Code 1- Verbalizes Understanding      Falls Prevention: - Provides verbal and written material to individual with discussion of falls prevention and safety.   Pulmonary Rehab from 05/15/2020 in [facility] Cardiac and Pulmonary Rehab  Date 03/11/20  Educator SB  Instruction Review Code 1- Verbalizes Understanding      Chronic Lung Disease Review: - Group verbal instruction with posters, models, PowerPoint presentations and videos,  to review new updates, new respiratory medications, new advancements in procedures and treatments. Providing information on websites and "800" numbers for continued self-education. Includes information about supplement oxygen, available portable oxygen systems, continuous and intermittent flow rates, oxygen safety, concentrators, and  Medicare reimbursement for oxygen. Explanation of Pulmonary Drugs, including class, frequency, complications, importance of spacers, rinsing mouth after steroid MDI's, and proper cleaning methods for nebulizers. Review of basic lung anatomy and physiology related to function, structure, and complications of lung disease. Review of risk factors. Discussion about methods for diagnosing sleep apnea and types of masks and machines for OSA. Includes a review of the use of types of environmental controls: home humidity, furnaces, filters, dust mite/pet prevention, HEPA vacuums. Discussion about weather changes, air quality and the benefits of nasal washing. Instruction on Warning signs, infection symptoms, calling MD promptly, preventive modes, and value of vaccinations. Review of effective airway clearance, coughing and/or vibration techniques. Emphasizing that all should Create an Action Plan. Written material given at graduation.   Pulmonary Rehab from 05/15/2020 in [facility] Cardiac and Pulmonary Rehab  Date 03/12/20  Instruction Review Code 3- Needs Reinforcement  [need  identified]      AED/CPR: - Group verbal and written instruction with the use of models to demonstrate the basic use of the AED with the basic ABC's of resuscitation.    Anatomy and Cardiac Procedures: - Group verbal and visual presentation and models provide information about basic cardiac anatomy and function. Reviews the testing methods done to diagnose heart disease and the outcomes of the test results. Describes the treatment choices: Medical Management, Angioplasty, or Coronary Bypass Surgery for treating various heart conditions including Myocardial Infarction, Angina, Valve Disease, and Cardiac Arrhythmias.  Written material given at graduation.   Medication Safety: - Group verbal and visual instruction to review commonly prescribed medications for heart and lung disease. Reviews the medication, class of the drug, and side effects. Includes the steps to properly store meds and maintain the prescription regimen.  Written material given at graduation.   Pulmonary Rehab from 05/15/2020 in [facility] Cardiac and Pulmonary Rehab  Date 05/08/20  Educator SB  Instruction Review Code 1- Verbalizes Understanding      Other: -Provides group and verbal instruction on various topics (see comments)   Knowledge Questionnaire Score:  Knowledge Questionnaire Score - 03/12/20 1126      Knowledge Questionnaire Score   Pre Score 17/18 Education Focus: O2 safety            Core Components/Risk Factors/Patient Goals at Admission:  Personal Goals and Risk Factors at Admission - 03/12/20 1128      Core Components/Risk Factors/Patient Goals on Admission    Weight Management Yes;Weight Maintenance    Intervention Weight Management: Develop a combined nutrition and exercise program designed to reach desired caloric intake, while maintaining appropriate intake of nutrient and fiber, sodium and fats, and appropriate energy expenditure required for the weight goal.;Weight Management: Provide education  and appropriate resources to help participant work on and attain dietary goals.    Admit Weight 133 lb (60.3 kg)    Goal Weight: Short Term 133 lb (60.3 kg)    Goal Weight: Long Term 130 lb (59 kg)    Expected Outcomes Short Term: Continue to assess and modify interventions until short term weight is achieved;Long Term: Adherence to nutrition and physical activity/exercise program aimed toward attainment of established weight goal;Weight Maintenance: Understanding of the daily nutrition guidelines, which includes 25-35% calories from fat, 7% or less cal from saturated fats, less than 200mg  cholesterol, less than 1.5gm of sodium, & 5 or more servings of fruits and vegetables daily    Improve shortness of breath with ADL's Yes    Intervention Provide education, individualized exercise plan and  daily activity instruction to help decrease symptoms of SOB with activities of daily living.    Expected Outcomes Short Term: Improve cardiorespiratory fitness to achieve a reduction of symptoms when performing ADLs;Long Term: Be able to perform more ADLs without symptoms or delay the onset of symptoms    Hypertension Yes    Intervention Provide education on lifestyle modifcations including regular physical activity/exercise, weight management, moderate sodium restriction and increased consumption of fresh fruit, vegetables, and low fat dairy, alcohol moderation, and smoking cessation.;Monitor prescription use compliance.    Expected Outcomes Short Term: Continued assessment and intervention until BP is < 140/64mm HG in hypertensive participants. < 130/33mm HG in hypertensive participants with diabetes, heart failure or chronic kidney disease.;Long Term: Maintenance of blood pressure at goal levels.    Lipids Yes    Intervention Provide education and support for participant on nutrition & aerobic/resistive exercise along with prescribed medications to achieve LDL 70mg , HDL >40mg .    Expected Outcomes Short Term:  Participant states understanding of desired cholesterol values and is compliant with medications prescribed. Participant is following exercise prescription and nutrition guidelines.;Long Term: Cholesterol controlled with medications as prescribed, with individualized exercise RX and with personalized nutrition plan. Value goals: LDL < 70mg , HDL > 40 mg.           Education:Diabetes - Individual verbal and written instruction to review signs/symptoms of diabetes, desired ranges of glucose level fasting, after meals and with exercise. Acknowledge that pre and post exercise glucose checks will be done for 3 sessions at entry of program.   Know Your Numbers and Heart Failure: - Group verbal and visual instruction to discuss disease risk factors for cardiac and pulmonary disease and treatment options.  Reviews associated critical values for Overweight/Obesity, Hypertension, Cholesterol, and Diabetes.  Discusses basics of heart failure: signs/symptoms and treatments.  Introduces Heart Failure Zone chart for action plan for heart failure.  Written material given at graduation.   Pulmonary Rehab from 05/15/2020 in [facility] Cardiac and Pulmonary Rehab  Date 05/15/20  Educator SB  Instruction Review Code 1- Verbalizes Understanding      Core Components/Risk Factors/Patient Goals Review:   Goals and Risk Factor Review    Row Name 04/10/20 1019 04/29/20 1017           Core Components/Risk Factors/Patient Goals Review   Personal Goals Review Improve shortness of breath with ADL's Improve shortness of breath with ADL's      Review Spoke to patient about their shortness of breath and what they can do to improve. Patient has been informed of breathing techniques when starting the program. Patient is informed to tell staff if they have had any med changes and that certain meds they are taking or not taking can be causing shortness of breath. Spoke to patient about their shortness of breath and what she can do  to improve including exercising at home attending rehab and practicing pursed lip breathing.      Expected Outcomes Short: Attend [facility] regularly to improve shortness of breath with ADL's. Long: maintain independence with ADL's Short: Attend [facility] regularly to improve shortness of breath with ADL's. Exercise at home, use PLB Long: maintain independence with ADL's, improve shortness of breath             Core Components/Risk Factors/Patient Goals at Discharge (Final Review):   Goals and Risk Factor Review - 04/29/20 1017      Core Components/Risk Factors/Patient Goals Review   Personal Goals Review Improve shortness of breath  with ADL's    Review Spoke to patient about their shortness of breath and what she can do to improve including exercising at home attending rehab and practicing pursed lip breathing.    Expected Outcomes Short: Attend [facility] regularly to improve shortness of breath with ADL's. Exercise at home, use PLB Long: maintain independence with ADL's, improve shortness of breath           ITP Comments:  ITP Comments    Row Name 03/11/20 1138 03/12/20 1118 03/13/20 1140 03/18/20 0933 03/27/20 0522   ITP Comments Virtual orientation call completed today. shehas an appointment on Date: 03/12/2020 for EP eval and gym Orientation.  [facility] 01[facility]. Completed 6MWT and gym orientation. Initial ITP created and sent for review to Dr. Bethann PunchesMark Miller, Medical Director. First full day of exercise!  Patient was oriented to gym and equipment including functions, settings, policies, and procedures.  Patient's individual exercise prescription and treatment plan were reviewed.  All starting workloads were established based on the results of the 6 minute walk test done at initial orientation visit.  The plan for exercise progression was also introduced and progression will be customized based on patient's performance and goals. Completed Initial  RD evaluation 30 Day review completed. Medical Director ITP review done, changes made as directed, and signed approval by Medical Director.   Row Name 04/24/20 [phone] 05/22/20 0914         ITP Comments 30 Day review completed. Medical Director ITP review done, changes made as directed, and signed approval by Medical Director. 30 Day review completed. Medical Director ITP review done, changes made as directed, and signed approval by Medical Director.             Comments:

## 2020-05-24 ENCOUNTER — Other Ambulatory Visit: Payer: Self-pay

## 2020-05-24 DIAGNOSIS — J9611 Chronic respiratory failure with hypoxia: Secondary | ICD-10-CM

## 2020-05-24 NOTE — Progress Notes (Signed)
Daily Session Note  Patient Details  Name: Mackenzie Walsh MRN: [id number] Date of Birth: [birth year] Referring Provider:     Pulmonary Rehab from 03/12/2020 in [facility] Cardiac and Pulmonary Rehab  Referring Provider Lanney Gins, Fraud MD      Encounter Date: 05/24/2020  Check In:  Session Check In - 05/24/20 0939      Check-In   Supervising physician immediately available to respond to emergencies See telemetry face sheet for immediately available ER MD    Location [facility]-Cardiac & Pulmonary Rehab    Staff Present Birdie Sons, MPA, RN;Laureen Owens Shark, BS, RRT, CPFT;Joseph Tessie Fass RCP,RRT,BSRT    Virtual Visit No    Medication changes reported     No    Fall or balance concerns reported    No    Warm-up and Cool-down Performed on first and last piece of equipment    Resistance Training Performed Yes    VAD Patient? No    PAD/SET Patient? No      Pain Assessment   Currently in Pain? No/denies              Social History   Tobacco Use  Smoking Status Never Smoker  Smokeless Tobacco Never Used    Goals Met:  Independence with exercise equipment Exercise tolerated well No report of cardiac concerns or symptoms Strength training completed today  Goals Unmet:  Not Applicable  Comments: Pt able to follow exercise prescription today without complaint.  Will continue to monitor for progression.    Dr. Emily Filbert is Medical Director for [place] and [facility].

## 2020-05-27 ENCOUNTER — Other Ambulatory Visit: Payer: Self-pay

## 2020-05-27 ENCOUNTER — Encounter: Payer: Medicare Other | Admitting: *Deleted

## 2020-05-27 DIAGNOSIS — J9611 Chronic respiratory failure with hypoxia: Secondary | ICD-10-CM | POA: Diagnosis not present

## 2020-05-27 NOTE — Progress Notes (Signed)
Daily Session Note  Patient Details  Name: Mackenzie Walsh MRN: [id number] Date of Birth: 02/27/1932 Referring Provider:     Pulmonary Rehab from 03/12/2020 in [facility] Cardiac and Pulmonary Rehab  Referring Provider Lanney Gins, Fraud MD      Encounter Date: 05/27/2020  Check In:  Session Check In - 05/27/20 1038      Check-In   Supervising physician immediately available to respond to emergencies See telemetry face sheet for immediately available ER MD    Location [facility]-Cardiac & Pulmonary Rehab    Staff Present Renita Papa, RN Moises Blood, BS, ACSM CEP, Exercise Physiologist;Amanda Oletta Darter, [place], ACSM CEP, Exercise Physiologist    Virtual Visit No    Medication changes reported     No    Fall or balance concerns reported    No    Warm-up and Cool-down Performed on first and last piece of equipment    Resistance Training Performed Yes    VAD Patient? No    PAD/SET Patient? No      Pain Assessment   Currently in Pain? No/denies              Social History   Tobacco Use  Smoking Status Never Smoker  Smokeless Tobacco Never Used    Goals Met:  Independence with exercise equipment Exercise tolerated well No report of cardiac concerns or symptoms Strength training completed today  Goals Unmet:  Not Applicable  Comments: Pt able to follow exercise prescription today without complaint.  Will continue to monitor for progression.    Dr. Emily Filbert is Medical Director for [place] and [facility].

## 2020-05-28 ENCOUNTER — Inpatient Hospital Stay: Payer: Medicare Other | Attending: Oncology

## 2020-05-28 ENCOUNTER — Other Ambulatory Visit: Payer: Self-pay

## 2020-05-28 ENCOUNTER — Encounter: Payer: Self-pay | Admitting: Oncology

## 2020-05-28 ENCOUNTER — Inpatient Hospital Stay (HOSPITAL_BASED_OUTPATIENT_CLINIC_OR_DEPARTMENT_OTHER): Payer: Medicare Other | Admitting: Oncology

## 2020-05-28 VITALS — BP 131/80 | HR 81 | Temp 96.8°F | Resp 18 | Wt 135.1 lb

## 2020-05-28 DIAGNOSIS — R778 Other specified abnormalities of plasma proteins: Secondary | ICD-10-CM

## 2020-05-28 DIAGNOSIS — I4891 Unspecified atrial fibrillation: Secondary | ICD-10-CM | POA: Insufficient documentation

## 2020-05-28 DIAGNOSIS — D509 Iron deficiency anemia, unspecified: Secondary | ICD-10-CM | POA: Diagnosis not present

## 2020-05-28 DIAGNOSIS — E039 Hypothyroidism, unspecified: Secondary | ICD-10-CM | POA: Insufficient documentation

## 2020-05-28 DIAGNOSIS — M818 Other osteoporosis without current pathological fracture: Secondary | ICD-10-CM | POA: Diagnosis not present

## 2020-05-28 DIAGNOSIS — D7589 Other specified diseases of blood and blood-forming organs: Secondary | ICD-10-CM | POA: Diagnosis not present

## 2020-05-28 DIAGNOSIS — Z79899 Other long term (current) drug therapy: Secondary | ICD-10-CM | POA: Insufficient documentation

## 2020-05-28 DIAGNOSIS — I10 Essential (primary) hypertension: Secondary | ICD-10-CM | POA: Insufficient documentation

## 2020-05-28 DIAGNOSIS — N183 Chronic kidney disease, stage 3 unspecified: Secondary | ICD-10-CM | POA: Diagnosis not present

## 2020-05-28 LAB — COMPREHENSIVE METABOLIC PANEL
ALT: 18 U/L (ref 0–44)
AST: 33 U/L (ref 15–41)
Albumin: 4.2 g/dL (ref 3.5–5.0)
Alkaline Phosphatase: 57 U/L (ref 38–126)
Anion gap: 11 (ref 5–15)
BUN: 27 mg/dL — ABNORMAL HIGH (ref 8–23)
CO2: 29 mmol/L (ref 22–32)
Calcium: 9.3 mg/dL (ref 8.9–10.3)
Chloride: 99 mmol/L (ref 98–111)
Creatinine, Ser: 1.36 mg/dL — ABNORMAL HIGH (ref 0.44–1.00)
GFR, Estimated: 38 mL/min — ABNORMAL LOW (ref >60)
Glucose, Bld: 97 mg/dL (ref 70–99)
Potassium: 4 mmol/L (ref 3.5–5.1)
Sodium: 139 mmol/L (ref 135–145)
Total Bilirubin: 1 mg/dL (ref 0.3–1.2)
Total Protein: 7 g/dL (ref 6.5–8.1)

## 2020-05-28 LAB — CBC WITH DIFFERENTIAL/PLATELET
Abs Immature Granulocytes: 0.03 10*3/uL (ref 0.00–0.07)
Basophils Absolute: 0 10*3/uL (ref 0.0–0.1)
Basophils Relative: 1 %
Eosinophils Absolute: 0.1 10*3/uL (ref 0.0–0.5)
Eosinophils Relative: 2 %
HCT: 36.4 % (ref 36.0–46.0)
Hemoglobin: 11.6 g/dL — ABNORMAL LOW (ref 12.0–15.0)
Immature Granulocytes: 1 %
Lymphocytes Relative: 27 %
Lymphs Abs: 1.5 10*3/uL (ref 0.7–4.0)
MCH: 34.6 pg — ABNORMAL HIGH (ref 26.0–34.0)
MCHC: 31.9 g/dL (ref 30.0–36.0)
MCV: 108.7 fL — ABNORMAL HIGH (ref 80.0–100.0)
Monocytes Absolute: 0.4 10*3/uL (ref 0.1–1.0)
Monocytes Relative: 8 %
Neutro Abs: 3.4 10*3/uL (ref 1.7–7.7)
Neutrophils Relative %: 61 %
Platelets: 151 10*3/uL (ref 150–400)
RBC: 3.35 MIL/uL — ABNORMAL LOW (ref 3.87–5.11)
RDW: 13.6 % (ref 11.5–15.5)
WBC: 5.4 10*3/uL (ref 4.0–10.5)
nRBC: 0 % (ref 0.0–0.2)

## 2020-05-28 LAB — FOLATE: Folate: 25 ng/mL (ref >5.9)

## 2020-05-28 LAB — VITAMIN B12: Vitamin B-12: 434 pg/mL (ref 180–914)

## 2020-05-28 LAB — LACTATE DEHYDROGENASE: LDH: 211 U/L — ABNORMAL HIGH (ref 98–192)

## 2020-05-28 NOTE — Progress Notes (Signed)
Hematology/Oncology Consult note [facility] Telephone:([phone] [phone]   Patient Care Team: Leonel Ramsay, MD as PCP - General (Infectious Diseases)  REFERRING PROVIDER: Leonel Ramsay, MD  CHIEF COMPLAINTS/REASON FOR VISIT:  Evaluation of abnormal SPEP  HISTORY OF PRESENTING ILLNESS:   Mackenzie Walsh is a  86 y.o.  female with PMH listed below was seen in consultation at the request of  Leonel Ramsay, MD  for evaluation of abnormal SPEP Patient was recently seen by Dr. Lacinda Axon neurosurgery for thoracic back pain with radiculopathy.  Patient has scoliosis, 3 weeks ago she started to feel severe upper back pain. X-ray of thoracic spine on 10/17/2019 showed multiple thoracic compression fractures.   11/07/2019 CT thoracic spine without contrast showed old and healed compression deformities at T1, T8 and T9.  Potentially more recent compression fracture at T3 and T7. 10/17/2019, patient had blood work-up including SPEP which showed  M spike of 3.7. CBC showed hemoglobin 11.5, MCV 107, platelet count 141.  No recent CMP Patient has CKD with creatinine baseline around 1.5.  Patient has multiple comorbidities including dilated cardiomyopathy, ventricular tachycardia, implantable cardioverter, chronic systolic CHF, CKD stage III, compression fracture, scoliosis deformity of the spine, mitral valve regurgitation, celiac disease, hypothyroidism, hypercholesterolemia, atrial fibrillation on anticoagulation, osteoporosis. She lives with her husband who has Alzheimer's disease.  Her children live in [place].  Her son lives about 30 minutes away from her home.  She gets help for grocery shopping.  She still drives herself and her husband to medical appointments.   INTERVAL HISTORY Mackenzie Walsh is a 86 y.o. female who has above history reviewed by me today presents for follow up visit for management of macrocytosis Problems and  complaints are listed below: Patient reports feeling tired.  Chronic back pain unchanged.  Otherwise no new complaints.  She has lost 7 pounds since 6 months ago.  Appetite is fair.  She takes care of his husband with Alzheimer's disease.  She has shortness of breath for which she is doing pulmonary rehab. Review of Systems  Constitutional: Positive for fatigue and unexpected weight change. Negative for appetite change, chills and fever.  HENT:   Negative for hearing loss and voice change.   Eyes: Negative for eye problems.  Respiratory: Negative for chest tightness and cough.   Cardiovascular: Negative for chest pain.  Gastrointestinal: Negative for abdominal distention, abdominal pain and blood in stool.  Endocrine: Negative for hot flashes.  Genitourinary: Negative for difficulty urinating and frequency.   Musculoskeletal: Positive for back pain. Negative for arthralgias.  Skin: Negative for itching and rash.  Neurological: Negative for extremity weakness.  Hematological: Negative for adenopathy.  Psychiatric/Behavioral: Negative for confusion.    MEDICAL HISTORY:  Past Medical History:  Diagnosis Date  . A-fib ([place])   . Allergy   . Anemia   . Arrhythmia   . Atrial fibrillation ([place])   . Broken femur ([place]) 2017  . Cataract   . Celiac disease   . Chronic pain syndrome   . Chronic systolic CHF (congestive heart failure) ([place])   . CKD (chronic kidney disease)   . COPD (chronic obstructive pulmonary disease) ([place])   . Dilated cardiomyopathy ([place])   . Hyperlipidemia   . Hypertension   . Migraines   . Mitral valve regurgitation   . Osteoporosis   . Posterior rhinorrhea   . Scoliosis deformity of spine   . Senile hyperkeratosis   . Thyroid disease  hypothyroid  . Ventricular tachyarrhythmia ([place])     SURGICAL HISTORY: Past Surgical History:  Procedure Laterality Date  . ablation arrythmia focus    . APPENDECTOMY  1945  . CARDIAC DEFIBRILLATOR PLACEMENT  2016  .  CARDIAC SURGERY    . CARDIOVERSION EXTERNAL X4    . PACEMAKER IMPLANT    . REPLACEMENT TOTAL KNEE RIGHT  2016  . TONSILLECTOMY  1945    SOCIAL HISTORY: Social History   Socioeconomic History  . Marital status: Married    Spouse name: Not on file  . Number of children: Not on file  . Years of education: Not on file  . Highest education level: Not on file  Occupational History  . Not on file  Tobacco Use  . Smoking status: Never Smoker  . Smokeless tobacco: Never Used  Vaping Use  . Vaping Use: Never used  Substance and Sexual Activity  . Alcohol use: Not Currently  . Drug use: Never  . Sexual activity: Not on file  Other Topics Concern  . Not on file  Social History Narrative  . Not on file   Social Determinants of Health   Financial Resource Strain:   . Difficulty of Paying Living Expenses: Not on file  Food Insecurity:   . Worried About Charity fundraiser in the Last Year: Not on file  . Ran Out of Food in the Last Year: Not on file  Transportation Needs:   . Lack of Transportation (Medical): Not on file  . Lack of Transportation (Non-Medical): Not on file  Physical Activity:   . Days of Exercise per Week: Not on file  . Minutes of Exercise per Session: Not on file  Stress:   . Feeling of Stress : Not on file  Social Connections:   . Frequency of Communication with Friends and Family: Not on file  . Frequency of Social Gatherings with Friends and Family: Not on file  . Attends Religious Services: Not on file  . Active Member of Clubs or Organizations: Not on file  . Attends Archivist Meetings: Not on file  . Marital Status: Not on file  Intimate Partner Violence:   . Fear of Current or Ex-Partner: Not on file  . Emotionally Abused: Not on file  . Physically Abused: Not on file  . Sexually Abused: Not on file    FAMILY HISTORY: Family History  Problem Relation Age of Onset  . Pneumonia Mother   . Osteoporosis Mother   . Stroke Father   .  Heart failure Father   . Hypertension Father   . Obesity Father   . Heart failure Sister     ALLERGIES:  is allergic to erythromycin base, gluten meal, and dofetilide.  MEDICATIONS:  Current Outpatient Medications  Medication Sig Dispense Refill  . Acetaminophen 500 MG capsule Take 2 capsules (1,000 mg total) by mouth 3 times/day as needed-between meals & bedtime. 30 capsule   . albuterol (PROVENTIL) (2.5 MG/3ML) 0.083% nebulizer solution Take 3 mLs (2.5 mg total) by nebulization every 4 (four) hours as needed for wheezing or shortness of breath. 75 mL 2  . amiodarone (PACERONE) 200 MG tablet Take 200 mg by mouth daily.     . bifidobacterium infantis (ALIGN) capsule Take 1 capsule by mouth daily.    Marland Kitchen CALCIUM CITRATE PO Take 600 mg by mouth 2 (two) times daily as needed.    . carvedilol (COREG) 12.5 MG tablet Take 12.5 mg by mouth 2 (two) times  daily with a meal.     . CITRACAL MAXIMUM 315-250 MG-UNIT TABS     . Coenzyme Q10 (COQ-10) 100 MG CAPS Take 100 mg by mouth daily.     . fluticasone (FLONASE) 50 MCG/ACT nasal spray Place 1-2 sprays into both nostrils daily.     . furosemide (LASIX) 40 MG tablet Take 20 mg by mouth daily.    Marland Kitchen gabapentin (NEURONTIN) 300 MG capsule Take 300 mg by mouth 3 (three) times daily.     Marland Kitchen levothyroxine (SYNTHROID) 112 MCG tablet Take 112 mcg by mouth daily before breakfast.     . losartan (COZAAR) 25 MG tablet Take 25 mg by mouth daily.     . Magnesium Oxide 200 MG TABS Take 200 mg by mouth daily.     . Multiple Vitamin (MULTI-VITAMIN DAILY PO) Take 1 tablet by mouth daily.     Marland Kitchen omeprazole (PRILOSEC) 40 MG capsule Take 40 mg by mouth daily.    . Rivaroxaban (XARELTO) 15 MG TABS tablet Take 15 mg by mouth daily with supper.     . simvastatin (ZOCOR) 10 MG tablet Take 10 mg by mouth at bedtime.    Marland Kitchen spironolactone (ALDACTONE) 25 MG tablet Take 12.5 mg by mouth daily.      No current facility-administered medications for this visit.     PHYSICAL  EXAMINATION: ECOG PERFORMANCE STATUS: 1 - Symptomatic but completely ambulatory Vitals:   05/28/20 0947  BP: 131/80  Pulse: 81  Resp: 18  Temp: (!) 96.8 F (36 C)   Filed Weights   05/28/20 0947  Weight: 135 lb 1.6 oz (61.3 kg)    Physical Exam Constitutional:      General: She is not in acute distress.    Comments: Frail appearance  HENT:     Head: Normocephalic and atraumatic.  Eyes:     General: No scleral icterus. Cardiovascular:     Rate and Rhythm: Normal rate and regular rhythm.     Heart sounds: Normal heart sounds.  Pulmonary:     Effort: Pulmonary effort is normal. No respiratory distress.     Breath sounds: No wheezing.  Abdominal:     General: Bowel sounds are normal. There is no distension.     Palpations: Abdomen is soft.  Musculoskeletal:        General: Normal range of motion.     Cervical back: Normal range of motion and neck supple.     Comments: Scoliosis of the spine  Skin:    General: Skin is warm and dry.     Findings: No erythema or rash.  Neurological:     Mental Status: She is alert and oriented to person, place, and time. Mental status is at baseline.     Cranial Nerves: No cranial nerve deficit.     Coordination: Coordination normal.  Psychiatric:        Mood and Affect: Mood normal.     LABORATORY DATA:  I have reviewed the data as listed Lab Results  Component Value Date   WBC 5.4 05/28/2020   HGB 11.6 (L) 05/28/2020   HCT 36.4 05/28/2020   MCV 108.7 (H) 05/28/2020   PLT 151 05/28/2020   Recent Labs    11/13/19 1106 01/16/20 0114 05/28/20 0925  NA 138 137 139  K 4.3 4.7 4.0  CL 101 98 99  CO2 28 28 29   GLUCOSE 86 134* 97  BUN 34* 35* 27*  CREATININE 1.53* 1.66* 1.36*  CALCIUM 9.1 9.0  9.3  GFRNONAA 31* 28* 38*  GFRAA 36* 32*  --   PROT 7.2 6.6 7.0  ALBUMIN 4.1 4.0 4.2  AST 29 35 33  ALT 20 24 18   ALKPHOS 78 51 57  BILITOT 0.8 1.3* 1.0   Iron/TIBC/Ferritin/ %Sat No results found for: IRON, TIBC, FERRITIN,  IRONPCTSAT    RADIOGRAPHIC STUDIES: I have personally reviewed the radiological images as listed and agreed with the findings in the report. No results found.    ASSESSMENT & PLAN:  1. Abnormal SPEP   2. Macrocytosis    #Abnormal SPEP, Patient has no M protein in blood or urine.  Serum light chain ratio was also normal.  Skeletal survey showed diffuse osteopenia which limits assessment of lytic lesion.  No evidence of focal bone lesion. No need for additional work-up at this point.  #Chronic microcytic anemia, with normal vitamin B12 and folate level. I am concerned that she has underlying bone marrow disorder, i.e. myelodysplastic syndrome.  We discussed again the option of proceeding with a bone marrow biopsy and patient is not interested.  She feels that current today she is the main caregiver for her husband as she does not want to proceed additional treatments or work-up.  She agrees with being monitored, and she will consider proceeding with bone marrow biopsy if her blood counts significantly drops in the future. 6 months  Orders Placed This Encounter  Procedures  . CBC with Differential/Platelet    Standing Status:   Future    Standing Expiration Date:   05/28/2021  . Comprehensive metabolic panel    Standing Status:   Future    Standing Expiration Date:   05/28/2021  . Lactate dehydrogenase    Standing Status:   Future    Standing Expiration Date:   05/28/2021  . Vitamin B12    Standing Status:   Future    Standing Expiration Date:   05/28/2021  . Folate    Standing Status:   Future    Standing Expiration Date:   05/28/2021    All questions were answered. The patient knows to call the clinic with any problems questions or concerns.  cc Leonel Ramsay, MD   Earlie Server, MD, PhD Hematology Oncology [facility] Pager- [phone] 05/28/2020  .

## 2020-05-28 NOTE — Progress Notes (Signed)
Patient here for follow up. Pt reports she has shortness of breath and is doing pulmonary rehab.

## 2020-05-29 ENCOUNTER — Other Ambulatory Visit: Payer: Self-pay

## 2020-05-29 DIAGNOSIS — J9611 Chronic respiratory failure with hypoxia: Secondary | ICD-10-CM | POA: Diagnosis not present

## 2020-05-29 NOTE — Progress Notes (Signed)
Daily Session Note  Patient Details  Name: Mackenzie Walsh MRN: [id number] Date of Birth: January 01, 1932 Referring Provider:     Pulmonary Rehab from 03/12/2020 in [facility] Cardiac and Pulmonary Rehab  Referring Provider Lanney Gins, Fraud MD      Encounter Date: 05/29/2020  Check In:  Session Check In - 05/29/20 1009      Check-In   Supervising physician immediately available to respond to emergencies See telemetry face sheet for immediately available ER MD    Location [facility]-Cardiac & Pulmonary Rehab    Staff Present Birdie Sons, MPA, Elveria Rising, BA, ACSM CEP, Exercise Physiologist;Joseph Tessie Fass RCP,RRT,BSRT    Virtual Visit No    Medication changes reported     No    Fall or balance concerns reported    No    Warm-up and Cool-down Performed on first and last piece of equipment    Resistance Training Performed Yes    VAD Patient? No    PAD/SET Patient? No      Pain Assessment   Currently in Pain? No/denies              Social History   Tobacco Use  Smoking Status Never Smoker  Smokeless Tobacco Never Used    Goals Met:  Independence with exercise equipment Exercise tolerated well No report of cardiac concerns or symptoms Strength training completed today  Goals Unmet:  Not Applicable  Comments: Pt able to follow exercise prescription today without complaint.  Will continue to monitor for progression.    Dr. Emily Filbert is Medical Director for [place] and [facility].

## 2020-05-31 ENCOUNTER — Other Ambulatory Visit: Payer: Self-pay

## 2020-05-31 DIAGNOSIS — J9611 Chronic respiratory failure with hypoxia: Secondary | ICD-10-CM | POA: Diagnosis not present

## 2020-05-31 NOTE — Progress Notes (Signed)
Daily Session Note  Patient Details  Name: Mackenzie Walsh MRN: [id number] Date of Birth: 08/07/1931 Referring Provider:   Flowsheet Row Pulmonary Rehab from 03/12/2020 in [facility] Cardiac and Pulmonary Rehab  Referring Provider Lanney Gins, Fraud MD      Encounter Date: 05/31/2020  Check In:  Session Check In - 05/31/20 0942      Check-In   Supervising physician immediately available to respond to emergencies See telemetry face sheet for immediately available ER MD    Location [facility]-Cardiac & Pulmonary Rehab    Staff Present Birdie Sons, MPA, RN;Joseph Darrin Nipper, [place], RCEP, CCRP, CCET    Virtual Visit No    Medication changes reported     No    Fall or balance concerns reported    No    Warm-up and Cool-down Performed on first and last piece of equipment    Resistance Training Performed Yes    VAD Patient? No    PAD/SET Patient? No      Pain Assessment   Currently in Pain? No/denies              Social History   Tobacco Use  Smoking Status Never Smoker  Smokeless Tobacco Never Used    Goals Met:  Independence with exercise equipment Exercise tolerated well No report of cardiac concerns or symptoms Strength training completed today  Goals Unmet:  Not Applicable  Comments: Pt able to follow exercise prescription today without complaint.  Will continue to monitor for progression.    Dr. Emily Filbert is Medical Director for [place] and [facility].

## 2020-06-03 ENCOUNTER — Other Ambulatory Visit: Payer: Self-pay

## 2020-06-03 ENCOUNTER — Encounter: Payer: Medicare Other | Admitting: *Deleted

## 2020-06-03 DIAGNOSIS — J9611 Chronic respiratory failure with hypoxia: Secondary | ICD-10-CM

## 2020-06-03 NOTE — Patient Instructions (Signed)
Discharge Patient Instructions  Patient Details  Name: Mackenzie Walsh MRN: [id number] Date of Birth: 10/26/1931 Referring Provider:  Vida Rigger, MD   Number of Visits: 66  Reason for Discharge:  Patient reached a stable level of exercise. Patient independent in their exercise.  Smoking History:  Social History   Tobacco Use  Smoking Status Never Smoker  Smokeless Tobacco Never Used    Diagnosis:  Chronic respiratory failure with hypoxia (HCC)  Initial Exercise Prescription:  Initial Exercise Prescription - 03/12/20 1100      Date of Initial Exercise RX and Referring Provider   Date 03/12/20    Referring Provider Karna Christmas, Fraud MD      Treadmill   MPH 0.7    Grade 0    Minutes 15    METs 1.5      Recumbant Bike   Level 1    RPM 50    Watts 5    Minutes 15    METs 1.5      NuStep   Level 1    SPM 80    Minutes 15    METs 1.5      REL-XR   Level 1    Speed 50    Minutes 15    METs 1.5      Prescription Details   Frequency (times per week) 3    Duration Progress to 30 minutes of continuous aerobic without signs/symptoms of physical distress      Intensity   THRR 40-80% of Max Heartrate 102-123    Ratings of Perceived Exertion 11-13    Perceived Dyspnea 0-4      Progression   Progression Continue to progress workloads to maintain intensity without signs/symptoms of physical distress.      Resistance Training   Training Prescription Yes    Weight 2 lb    Reps 10-15           Discharge Exercise Prescription (Final Exercise Prescription Changes):  Exercise Prescription Changes - 05/23/20 1100      Response to Exercise   Blood Pressure (Admit) 92/60    Blood Pressure (Exercise) 92/52    Blood Pressure (Exit) 96/52    Heart Rate (Admit) 80 bpm    Heart Rate (Exercise) 80 bpm    Heart Rate (Exit) 80 bpm    Oxygen Saturation (Admit) 93 %    Oxygen Saturation (Exercise) 91 %    Oxygen Saturation (Exit) 96 %    Rating of Perceived  Exertion (Exercise) 13    Perceived Dyspnea (Exercise) 1    Duration Continue with 30 min of aerobic exercise without signs/symptoms of physical distress.    Intensity THRR unchanged      Progression   Progression Continue to progress workloads to maintain intensity without signs/symptoms of physical distress.    Average METs 1.2      Resistance Training   Training Prescription Yes    Weight 2 lb    Reps 10-15      Interval Training   Interval Training No      NuStep   Level 3    SPM 80    Minutes 15    METs 1.2           Functional Capacity:  6 Minute Walk    Row Name 03/12/20 1120 05/27/20 1142       6 Minute Walk   Phase Initial Discharge    Distance 410 feet 400 feet    Walk Time 4.32  minutes 4.5 minutes    # of Rest Breaks 3  7 sec, 50 sec, 32 sec 3    MPH 1.08 1.01    METS 0.52 0.9    RPE 17 17    Perceived Dyspnea  4 4    VO2 Peak 1.82 3.1    Symptoms Yes (comment) Yes (comment)    Comments SOB SOB    Resting HR 81 bpm 80 bpm    Resting BP 130/64 134/62    Resting Oxygen Saturation  94 % 95 %    Exercise Oxygen Saturation  during 6 min walk 89 % 85 %    Max Ex. HR 95 bpm 131 bpm    Max Ex. BP 134/66 134/64    2 Minute Post BP 128/64 132/62         Interval HR   1 Minute HR 95 --    2 Minute HR 90 --    3 Minute HR 85 --    4 Minute HR 89 --    5 Minute HR 83 --    6 Minute HR 94 131    2 Minute Post HR 80 81    Interval Heart Rate? Yes Yes         Interval Oxygen   Interval Oxygen? Yes --    Baseline Oxygen Saturation % 94 % 95 %    1 Minute Oxygen Saturation % 92 % --    1 Minute Liters of Oxygen 0 L  Room Air 0 L    2 Minute Oxygen Saturation % 89 % --    2 Minute Liters of Oxygen 0 L 0 L    3 Minute Oxygen Saturation % 92 % --    3 Minute Liters of Oxygen 0 L 0 L    4 Minute Oxygen Saturation % 90 % --    4 Minute Liters of Oxygen 0 L 0 L    5 Minute Oxygen Saturation % 91 % --    5 Minute Liters of Oxygen 0 L 0 L    6 Minute  Oxygen Saturation % 89 % 85 %    6 Minute Liters of Oxygen 0 L 0 L    2 Minute Post Oxygen Saturation % 94 % 93 %    2 Minute Post Liters of Oxygen 0 L 0 L           Nutrition:  Nutrition Therapy & Goals - 03/18/20 0916      Nutrition Therapy   Diet Pulmonary MNT    Protein (specify units) 75g    Fiber 25 grams    Whole Grain Foods 3 servings    Saturated Fats 12 max. grams    Fruits and Vegetables 5 servings/day    Sodium 1.5 grams      Personal Nutrition Goals   Nutrition Goal ST: Add protein food to breakfast consistently - egg or protein shake for example, eat more moist foods to help with swallowing LT: improve SOB    Comments No problems breathing during or after eating. Reports having trouble swallowing. Doesn't like spicy food. Appetite is somewhat lower than it has been. B: blueberries with dry cereal and milk w/ oj or an egg with toast and sausage with a banana every morning. L: Toasted sandwich with ham and cheese, slice of cheese with [place] breast and tomato with a side salad. sometimes lunch from [employer] at [place]. S: fruit with some yogurt. D: dining hall - meat,  starch, vegetables with salad. Drinks: OJ and decaf coffee, water and apple juice, decaf tea. Pt uses gluten free bread. Pt reports she has been losing some weight 138-133 in last few months. Discussed pulmonary medical nutrition therapy. Pt reported not not wanting to change much of what she is eating, is willing to add in more protein to breakfast.      Intervention Plan   Intervention Prescribe, educate and counsel regarding individualized specific dietary modifications aiming towards targeted core components such as weight, hypertension, lipid management, diabetes, heart failure and other comorbidities.;Nutrition handout(s) given to patient.    Expected Outcomes Short Term Goal: Understand basic principles of dietary content, such as calories, fat, sodium, cholesterol and nutrients.;Short Term Goal: A  plan has been developed with personal nutrition goals set during dietitian appointment.;Long Term Goal: Adherence to prescribed nutrition plan.          Goals reviewed with patient; copy given to patient.

## 2020-06-03 NOTE — Progress Notes (Signed)
Daily Session Note  Patient Details  Name: Mackenzie Walsh MRN: [id number] Date of Birth: [birth year] Referring Provider:   Flowsheet Row Pulmonary Rehab from 03/12/2020 in [facility]  Referring Provider Lanney Gins, Fraud MD      Encounter Date: 06/03/2020  Check In:  Session Check In - 06/03/20 1025      Check-In   Supervising physician immediately available to respond to emergencies See telemetry face sheet for immediately available ER MD    Location [facility]    Staff Present Renita Papa, RN BSN;Joseph [address] [place], [place], ACSM CEP, Exercise Physiologist;Amanda Oletta Darter, [place], ACSM CEP, Exercise Physiologist    Virtual Visit No    Medication changes reported     No    Fall or balance concerns reported    No    Warm-up and Cool-down Performed on first and last piece of equipment    Resistance Training Performed Yes    VAD Patient? No    PAD/SET Patient? No      Pain Assessment   Currently in Pain? No/denies              Social History   Tobacco Use  Smoking Status Never Smoker  Smokeless Tobacco Never Used    Goals Met:  Independence with exercise equipment Exercise tolerated well No report of cardiac concerns or symptoms Strength training completed today  Goals Unmet:  Not Applicable  Comments: Pt able to follow exercise prescription today without complaint.  Will continue to monitor for progression.    Dr. Emily Filbert is Medical Director for [facility].

## 2020-06-04 ENCOUNTER — Other Ambulatory Visit: Payer: Self-pay | Admitting: Pulmonary Disease

## 2020-06-04 DIAGNOSIS — R0600 Dyspnea, unspecified: Secondary | ICD-10-CM

## 2020-06-05 ENCOUNTER — Other Ambulatory Visit: Payer: Self-pay

## 2020-06-05 ENCOUNTER — Encounter: Payer: Medicare Other | Admitting: *Deleted

## 2020-06-05 DIAGNOSIS — J9611 Chronic respiratory failure with hypoxia: Secondary | ICD-10-CM | POA: Diagnosis not present

## 2020-06-05 NOTE — Progress Notes (Signed)
Pulmonary Individual Treatment Plan  Patient Details  Name: Mackenzie Walsh MRN: [id number] Date of Birth: 10-10-1931 Referring Provider:   Flowsheet Row [facility]  Referring Provider Karna ChristmasAleskerov, Fraud MD      Initial Encounter Date:  Flowsheet Row [facility]monary [facility]  Date 03/12/20      Visit Diagnosis: Chronic respiratory failure with hypoxia (HCC)  Patient's Home Medications on Admission:  Current Outpatient Medications:  .  Acetaminophen 500 MG capsule, Take 2 capsules (1,000 mg total) by mouth 3 times/day as needed-between meals & bedtime., Disp: 30 capsule, Rfl:  .  albuterol (PROVENTIL) (2.5 MG/3ML) 0.083% nebulizer solution, Take 3 mLs (2.5 mg total) by nebulization every 4 (four) hours as needed for wheezing or shortness of breath., Disp: 75 mL, Rfl: 2 .  amiodarone (PACERONE) 200 MG tablet, Take 200 mg by mouth daily. , Disp: , Rfl:  .  bifidobacterium infantis (ALIGN) capsule, Take 1 capsule by mouth daily., Disp: , Rfl:  .  CALCIUM CITRATE PO, Take 600 mg by mouth 2 (two) times daily as needed., Disp: , Rfl:  .  carvedilol (COREG) 12.5 MG tablet, Take 12.5 mg by mouth 2 (two) times daily with a meal. , Disp: , Rfl:  .  CITRACAL MAXIMUM 315-250 MG-UNIT TABS, , Disp: , Rfl:  .  Coenzyme Q10 (COQ-10) 100 MG CAPS, Take 100 mg by mouth daily. , Disp: , Rfl:  .  fluticasone (FLONASE) 50 MCG/ACT nasal spray, Place 1-2 sprays into both nostrils daily. , Disp: , Rfl:  .  furosemide (LASIX) 40 MG tablet, Take 20 mg by mouth daily., Disp: , Rfl:  .  gabapentin (NEURONTIN) 300 MG capsule, Take 300 mg by mouth 3 (three) times daily. , Disp: , Rfl:  .  levothyroxine (SYNTHROID) 112 MCG tablet, Take 112 mcg by mouth daily before breakfast. , Disp: , Rfl:  .  losartan (COZAAR) 25 MG tablet, Take 25 mg by mouth daily. , Disp: , Rfl:  .  Magnesium Oxide 200 MG TABS, Take 200 mg by  mouth daily. , Disp: , Rfl:  .  Multiple Vitamin (MULTI-VITAMIN DAILY PO), Take 1 tablet by mouth daily. , Disp: , Rfl:  .  omeprazole (PRILOSEC) 40 MG capsule, Take 40 mg by mouth daily., Disp: , Rfl:  .  Rivaroxaban (XARELTO) 15 MG TABS tablet, Take 15 mg by mouth daily with supper. , Disp: , Rfl:  .  simvastatin (ZOCOR) 10 MG tablet, Take 10 mg by mouth at bedtime., Disp: , Rfl:  .  spironolactone (ALDACTONE) 25 MG tablet, Take 12.5 mg by mouth daily. , Disp: , Rfl:   Past Medical History: Past Medical History:  Diagnosis Date  . A-fib (HCC)   . Allergy   . Anemia   . Arrhythmia   . Atrial fibrillation (HCC)   . Broken femur (HCC) 2017  . Cataract   . Celiac disease   . Chronic pain syndrome   . Chronic systolic CHF (congestive heart failure) (HCC)   . CKD (chronic kidney disease)   . COPD (chronic obstructive pulmonary disease) (HCC)   . Dilated cardiomyopathy (HCC)   . Hyperlipidemia   . Hypertension   . Migraines   . Mitral valve regurgitation   . Osteoporosis   . Posterior rhinorrhea   . Scoliosis deformity of spine   . Senile hyperkeratosis   . Thyroid disease    hypothyroid  . Ventricular  tachyarrhythmia (HCC)     Tobacco Use: Social History   Tobacco Use  Smoking Status Never Smoker  Smokeless Tobacco Never Used    Labs: Recent Review Flowsheet Data   There is no flowsheet data to display.      Pulmonary Assessment Scores:  Pulmonary Assessment Scores    Row Name 03/12/20 1129 05/31/20 0956       ADL UCSD   ADL Phase Entry Exit    SOB Score total 61 60    Rest 0 0    Walk 3 2    Stairs 5 4    Bath 2 3    Dress 2 2    Shop 3 3         CAT Score   CAT Score 12 19         mMRC Score   mMRC Score 3 3           UCSD: Self-administered rating of dyspnea associated with activities of daily living (ADLs) 6-point scale (0 = "not at all" to 5 = "maximal or unable to do because of breathlessness")  Scoring Scores range from 0 to 120.   Minimally important difference is 5 units  CAT: CAT can identify the health impairment of COPD patients and is better correlated with disease progression.  CAT has a scoring range of zero to 40. The CAT score is classified into four groups of low (less than 10), medium (10 - 20), high (21-30) and very high (31-40) based on the impact level of disease on health status. A CAT score over 10 suggests significant symptoms.  A worsening CAT score could be explained by an exacerbation, poor medication adherence, poor inhaler technique, or progression of COPD or comorbid conditions.  CAT MCID is 2 points  mMRC: mMRC (Modified Medical Research Council) Dyspnea Scale is used to assess the degree of baseline functional disability in patients of respiratory disease due to dyspnea. No minimal important difference is established. A decrease in score of 1 point or greater is considered a positive change.   Pulmonary Function Assessment:   Exercise Target Goals: Exercise Program Goal: Individual exercise prescription set using results from initial 6 min walk test and THRR while considering  patient's activity barriers and safety.   Exercise Prescription Goal: Initial exercise prescription builds to 30-45 minutes a day of aerobic activity, 2-3 days per week.  Home exercise guidelines will be given to patient during program as part of exercise prescription that the participant will acknowledge.  Education: Aerobic Exercise: - Group verbal and visual presentation on the components of exercise prescription. Introduces F.I.T.T principle from ACSM for exercise prescriptions.  Reviews F.I.T.T. principles of aerobic exercise including progression. Written material given at graduation. Flowsheet Row Pulmonary Rehab from 05/29/2020 in [facility] Cardiac and Pulmonary Rehab  Date 04/17/20  Educator [facility]  Instruction Review Code 1- Verbalizes Understanding      Education: Resistance Exercise: - Group verbal and visual  presentation on the components of exercise prescription. Introduces F.I.T.T principle from ACSM for exercise prescriptions  Reviews F.I.T.T. principles of resistance exercise including progression. Written material given at graduation.    Education: Exercise & Equipment Safety: - Individual verbal instruction and demonstration of equipment use and safety with use of the equipment. Flowsheet Row Pulmonary Rehab from 05/29/2020 in [facility] Cardiac and Pulmonary Rehab  Date 03/12/20  Educator [facility]  Instruction Review Code 1- Verbalizes Understanding      Education: Exercise Physiology & General Exercise Guidelines: - Group  verbal and written instruction with models to review the exercise physiology of the cardiovascular system and associated critical values. Provides general exercise guidelines with specific guidelines to those with heart or lung disease.  Flowsheet Row Pulmonary Rehab from 05/29/2020 in [facility] Cardiac and Pulmonary Rehab  Date 04/03/20  Educator [facility]  Instruction Review Code 1- Verbalizes Understanding      Education: Flexibility, Balance, Mind/Body Relaxation: - Group verbal and visual presentation with interactive activity on the components of exercise prescription. Introduces F.I.T.T principle from ACSM for exercise prescriptions. Reviews F.I.T.T. principles of flexibility and balance exercise training including progression. Also discusses the mind body connection.  Reviews various relaxation techniques to help reduce and manage stress (i.e. Deep breathing, progressive muscle relaxation, and visualization). Balance handout provided to take home. Written material given at graduation. Flowsheet Row Pulmonary Rehab from 05/29/2020 in [facility] Cardiac and Pulmonary Rehab  Date 04/24/20  Educator [facility]  Instruction Review Code 1- Verbalizes Understanding      Activity Barriers & Risk Stratification:  Activity Barriers & Cardiac Risk Stratification - 03/12/20 1122      Activity Barriers &  Cardiac Risk Stratification   Activity Barriers Right Knee Replacement;Other (comment);Neck/Spine Problems;Shortness of Breath;Deconditioning;Muscular Weakness;Back Problems;Balance Concerns;Assistive Device;History of Falls    Comments Rod in femur, scoliosis, spinal stenosis, marginal vision in left eye, limited ROM from ICD, kyphosis, scoliosis           6 Minute Walk:  6 Minute Walk    Row Name 03/12/20 1120 05/27/20 1142       6 Minute Walk   Phase Initial Discharge    Distance 410 feet 400 feet    Walk Time 4.32 minutes 4.5 minutes    # of Rest Breaks 3  7 sec, 50 sec, 32 sec 3    MPH 1.08 1.01    METS 0.52 0.9    RPE 17 17    Perceived Dyspnea  4 4    VO2 Peak 1.82 3.1    Symptoms Yes (comment) Yes (comment)    Comments SOB SOB    Resting HR 81 bpm 80 bpm    Resting BP 130/64 134/62    Resting Oxygen Saturation  94 % 95 %    Exercise Oxygen Saturation  during 6 min walk 89 % 85 %    Max Ex. HR 95 bpm 131 bpm    Max Ex. BP 134/66 134/64    2 Minute Post BP 128/64 132/62         Interval HR   1 Minute HR 95 --    2 Minute HR 90 --    3 Minute HR 85 --    4 Minute HR 89 --    5 Minute HR 83 --    6 Minute HR 94 131    2 Minute Post HR 80 81    Interval Heart Rate? Yes Yes         Interval Oxygen   Interval Oxygen? Yes --    Baseline Oxygen Saturation % 94 % 95 %    1 Minute Oxygen Saturation % 92 % --    1 Minute Liters of Oxygen 0 L  Room Air 0 L    2 Minute Oxygen Saturation % 89 % --    2 Minute Liters of Oxygen 0 L 0 L    3 Minute Oxygen Saturation % 92 % --    3 Minute Liters of Oxygen 0 L 0 L    4  Minute Oxygen Saturation % 90 % --    4 Minute Liters of Oxygen 0 L 0 L    5 Minute Oxygen Saturation % 91 % --    5 Minute Liters of Oxygen 0 L 0 L    6 Minute Oxygen Saturation % 89 % 85 %    6 Minute Liters of Oxygen 0 L 0 L    2 Minute Post Oxygen Saturation % 94 % 93 %    2 Minute Post Liters of Oxygen 0 L 0 L          Oxygen Initial  Assessment:  Oxygen Initial Assessment - 03/12/20 1129      Home Oxygen   Home Oxygen Device Home Concentrator;E-Tanks    Sleep Oxygen Prescription Continuous    Liters per minute 2    Home Exercise Oxygen Prescription None    Home Resting Oxygen Prescription None    Compliance with Home Oxygen Use Yes      Initial 6 min Walk   Oxygen Used None      Program Oxygen Prescription   Program Oxygen Prescription None      Intervention   Short Term Goals To learn and exhibit compliance with exercise, home and travel O2 prescription;To learn and understand importance of monitoring SPO2 with pulse oximeter and demonstrate accurate use of the pulse oximeter.;To learn and understand importance of maintaining oxygen saturations>88%;To learn and demonstrate proper pursed lip breathing techniques or other breathing techniques.;To learn and demonstrate proper use of respiratory medications    Long  Term Goals Exhibits compliance with exercise, home and travel O2 prescription;Verbalizes importance of monitoring SPO2 with pulse oximeter and return demonstration;Maintenance of O2 saturations>88%;Exhibits proper breathing techniques, such as pursed lip breathing or other method taught during program session;Compliance with respiratory medication;Demonstrates proper use of MDI's           Oxygen Re-Evaluation:  Oxygen Re-Evaluation    Row Name 04/10/20 1016 04/29/20 1019           Program Oxygen Prescription   Program Oxygen Prescription None None             Home Oxygen   Home Oxygen Device Home Concentrator;E-Tanks Home Concentrator;E-Tanks      Sleep Oxygen Prescription Continuous Continuous      Liters per minute 2 2      Home Exercise Oxygen Prescription None None      Home Resting Oxygen Prescription None None      Compliance with Home Oxygen Use Yes Yes             Goals/Expected Outcomes   Short Term Goals To learn and demonstrate proper pursed lip breathing techniques or other  breathing techniques.;To learn and exhibit compliance with exercise, home and travel O2 prescription To learn and demonstrate proper pursed lip breathing techniques or other breathing techniques.;To learn and exhibit compliance with exercise, home and travel O2 prescription      Long  Term Goals Exhibits compliance with exercise, home and travel O2 prescription;Exhibits proper breathing techniques, such as pursed lip breathing or other method taught during program session Exhibits compliance with exercise, home and travel O2 prescription;Exhibits proper breathing techniques, such as pursed lip breathing or other method taught during program session      Comments Informed patient how to perform the Pursed Lipped breathing technique. Told patient to Inhale through the nose and out the mouth with pursed lips to keep their airways open, help oxygenate them better, practice when  at rest or doing strenuous activity. Patient Verbalizes understanding of technique and will work on and be reiterated during [facility]. Doesn't normally use oxygen tank and was told she doesn't need it. She is still using the home concentrator. Reviewed PLB techniques and encouraged her to exercise at home and continue to attend rehab to improve breathing.      Goals/Expected Outcomes Short: use PLB with exertion. Long: use PLB on exertion proficiently and independently. Short: use PLB with exertion, excercise at home, attend rehab. Long: use PLB on exertion proficiently and independently.             Oxygen Discharge (Final Oxygen Re-Evaluation):  Oxygen Re-Evaluation - 04/29/20 1019      Program Oxygen Prescription   Program Oxygen Prescription None      Home Oxygen   Home Oxygen Device Home Concentrator;E-Tanks    Sleep Oxygen Prescription Continuous    Liters per minute 2    Home Exercise Oxygen Prescription None    Home Resting Oxygen Prescription None    Compliance with Home Oxygen Use Yes      Goals/Expected  Outcomes   Short Term Goals To learn and demonstrate proper pursed lip breathing techniques or other breathing techniques.;To learn and exhibit compliance with exercise, home and travel O2 prescription    Long  Term Goals Exhibits compliance with exercise, home and travel O2 prescription;Exhibits proper breathing techniques, such as pursed lip breathing or other method taught during program session    Comments Doesn't normally use oxygen tank and was told she doesn't need it. She is still using the home concentrator. Reviewed PLB techniques and encouraged her to exercise at home and continue to attend rehab to improve breathing.    Goals/Expected Outcomes Short: use PLB with exertion, excercise at home, attend rehab. Long: use PLB on exertion proficiently and independently.           Initial Exercise Prescription:  Initial Exercise Prescription - 03/12/20 1100      Date of Initial Exercise RX and Referring Provider   Date 03/12/20    Referring Provider Karna Christmas, Fraud MD      Treadmill   MPH 0.7    Grade 0    Minutes 15    METs 1.5      Recumbant Bike   Level 1    RPM 50    Watts 5    Minutes 15    METs 1.5      NuStep   Level 1    SPM 80    Minutes 15    METs 1.5      REL-XR   Level 1    Speed 50    Minutes 15    METs 1.5      Prescription Details   Frequency (times per week) 3    Duration Progress to 30 minutes of continuous aerobic without signs/symptoms of physical distress      Intensity   THRR 40-80% of Max Heartrate 102-123    Ratings of Perceived Exertion 11-13    Perceived Dyspnea 0-4      Progression   Progression Continue to progress workloads to maintain intensity without signs/symptoms of physical distress.      Resistance Training   Training Prescription Yes    Weight 2 lb    Reps 10-15           Perform Capillary Blood Glucose checks as needed.  Exercise Prescription Changes:  Exercise Prescription Changes    Row Name  03/12/20 1100  03/13/20 1600 03/28/20 0700 04/10/20 1300 04/24/20 1500     Response to Exercise   Blood Pressure (Admit) 130/64 84/58 98/64  132/74 102/58   Blood Pressure (Exercise) 134/66 102/78 122/70 120/66 112/66   Blood Pressure (Exit) 128/64 102/58 118/72 106/60 100/58   Heart Rate (Admit) 81 bpm 80 bpm 80 bpm 76 bpm 80 bpm   Heart Rate (Exercise) 95 bpm 58 bpm 81 bpm 72 bpm 80 bpm   Heart Rate (Exit) 79 bpm 80 bpm 80 bpm 79 bpm 90 bpm   Oxygen Saturation (Admit) 94 % 93 % 95 % 90 % 90 %   Oxygen Saturation (Exercise) 89 % 84 % 91 % 89 % 92 %   Oxygen Saturation (Exit) 94 % 92 % 95 % 92 % 90 %   Rating of Perceived Exertion (Exercise) 17 11 12 12 13    Perceived Dyspnea (Exercise) 4 -- 2 2 1    Symptoms SOB -- SOb -- SOB   Comments walk test results first day -- -- --   Duration -- Progress to 30 minutes of  aerobic without signs/symptoms of physical distress Continue with 30 min of aerobic exercise without signs/symptoms of physical distress. Continue with 30 min of aerobic exercise without signs/symptoms of physical distress. Continue with 30 min of aerobic exercise without signs/symptoms of physical distress.   Intensity -- THRR unchanged THRR unchanged THRR unchanged THRR unchanged     Progression   Progression -- Continue to progress workloads to maintain intensity without signs/symptoms of physical distress. Continue to progress workloads to maintain intensity without signs/symptoms of physical distress. Continue to progress workloads to maintain intensity without signs/symptoms of physical distress. Continue to progress workloads to maintain intensity without signs/symptoms of physical distress.   Average METs -- 1.2 1.2 1.2 1.4     Resistance Training   Training Prescription -- Yes Yes Yes Yes   Weight -- 2 lb 2 lb 2 lb 2 lb   Reps -- 10-15 10-15 10-15 10-15     Interval Training   Interval Training -- -- No No No     Treadmill   MPH -- -- -- -- 0.7   Grade -- -- -- -- 0   Minutes --  -- -- -- 15   METs -- -- -- -- 1.5     Recumbant Bike   Level -- -- 1 -- --   Minutes -- -- 15 -- --     NuStep   Level -- 1 1 3 3    SPM -- 80 -- 80 --   Minutes -- 15 15 15 30    METs -- 1.2 1.3 1.2 1.4     Biostep-RELP   Level -- -- 1 -- --   Minutes -- -- 15 -- --   METs -- -- 1 -- --     Home Exercise Plan   Plans to continue exercise at -- -- -- -- Home (comment)  walking   Frequency -- -- -- -- Add 1 additional day to program exercise sessions.   Initial Home Exercises Provided -- -- -- -- 04/22/20   Row Name 05/08/20 1300 05/23/20 1100           Response to Exercise   Blood Pressure (Admit) 118/60 92/60      Blood Pressure (Exercise) 124/62 92/52      Blood Pressure (Exit) 102/58 96/52      Heart Rate (Admit) 96 bpm 80 bpm      Heart Rate (  Exercise) 80 bpm 80 bpm      Heart Rate (Exit) 80 bpm 80 bpm      Oxygen Saturation (Admit) 94 % 93 %      Oxygen Saturation (Exercise) 93 % 91 %      Oxygen Saturation (Exit) 94 % 96 %      Rating of Perceived Exertion (Exercise) 12 13      Perceived Dyspnea (Exercise) 3 1      Symptoms SOB --      Duration Continue with 30 min of aerobic exercise without signs/symptoms of physical distress. Continue with 30 min of aerobic exercise without signs/symptoms of physical distress.      Intensity THRR unchanged THRR unchanged             Progression   Progression Continue to progress workloads to maintain intensity without signs/symptoms of physical distress. Continue to progress workloads to maintain intensity without signs/symptoms of physical distress.      Average METs 1.5 1.2             Resistance Training   Training Prescription Yes Yes      Weight 2 lb 2 lb      Reps 10-15 10-15             Interval Training   Interval Training No No             NuStep   Level 3 3      SPM 80 80      Minutes 15 15      METs 1.5 1.2             Exercise Comments:  Exercise Comments    Row Name 06/05/20 1000            Exercise Comments Mackenzie Walsh graduated today from  rehab with 36 sessions completed.  Details of the patient's exercise prescription and what She needs to do in order to continue the prescription and progress were discussed with patient.  Patient was given a copy of prescription and goals.  Patient verbalized understanding.  Mackenzie Walsh plans to continue to exercise by exercising at her community gym .              Exercise Goals and Review:  Exercise Goals    Row Name 03/12/20 1125             Exercise Goals   Increase Physical Activity Yes       Intervention Provide advice, education, support and counseling about physical activity/exercise needs.;Develop an individualized exercise prescription for aerobic and resistive training based on initial evaluation findings, risk stratification, comorbidities and participant's personal goals.       Expected Outcomes Short Term: Attend rehab on a regular basis to increase amount of physical activity.;Long Term: Add in home exercise to make exercise part of routine and to increase amount of physical activity.;Long Term: Exercising regularly at least 3-5 days a week.       Increase Strength and Stamina Yes       Intervention Provide advice, education, support and counseling about physical activity/exercise needs.;Develop an individualized exercise prescription for aerobic and resistive training based on initial evaluation findings, risk stratification, comorbidities and participant's personal goals.       Expected Outcomes Short Term: Increase workloads from initial exercise prescription for resistance, speed, and METs.;Long Term: Improve cardiorespiratory fitness, muscular endurance and strength as measured by increased METs and functional capacity ( );Short Term: Perform resistance training exercises routinely during  rehab and add in resistance training at home       Able to understand and use rate of perceived exertion (RPE) scale Yes        Intervention Provide education and explanation on how to use RPE scale       Expected Outcomes Short Term: Able to use RPE daily in rehab to express subjective intensity level;Long Term:  Able to use RPE to guide intensity level when exercising independently       Able to understand and use Dyspnea scale Yes       Intervention Provide education and explanation on how to use Dyspnea scale       Expected Outcomes Short Term: Able to use Dyspnea scale daily in rehab to express subjective sense of shortness of breath during exertion;Long Term: Able to use Dyspnea scale to guide intensity level when exercising independently       Knowledge and understanding of Target Heart Rate Range (THRR) Yes       Intervention Provide education and explanation of THRR including how the numbers were predicted and where they are located for reference       Expected Outcomes Short Term: Able to state/look up THRR;Short Term: Able to use daily as guideline for intensity in rehab;Long Term: Able to use THRR to govern intensity when exercising independently       Able to check pulse independently Yes       Intervention Provide education and demonstration on how to check pulse in carotid and radial arteries.;Review the importance of being able to check your own pulse for safety during independent exercise       Expected Outcomes Short Term: Able to explain why pulse checking is important during independent exercise;Long Term: Able to check pulse independently and accurately       Understanding of Exercise Prescription Yes       Intervention Provide education, explanation, and written materials on patient's individual exercise prescription       Expected Outcomes Short Term: Able to explain program exercise prescription;Long Term: Able to explain home exercise prescription to exercise independently              Exercise Goals Re-Evaluation :  Exercise Goals Re-Evaluation    Row Name 03/13/20 1140 03/28/20 0742 04/10/20  1021 04/10/20 1333 04/22/20 1307     Exercise Goal Re-Evaluation   Exercise Goals Review Increase Physical Activity;Able to understand and use rate of perceived exertion (RPE) scale;Understanding of Exercise Prescription;Knowledge and understanding of Target Heart Rate Range (THRR);Increase Strength and Stamina;Able to understand and use Dyspnea scale Increase Physical Activity;Increase Strength and Stamina;Understanding of Exercise Prescription Increase Physical Activity;Increase Strength and Stamina Increase Physical Activity;Increase Strength and Stamina Increase Physical Activity;Increase Strength and Stamina   Comments Reviewed RPE and dyspnea scales, THR and program prescription with pt today.  Pt voiced understanding and was given a copy of goals to take home. Mackenzie Walsh is off to a good start in rehab.  She uses a towel to help cushion her back on the bike and the NuStep.  We will continue to work with her progression. Patient has been walking the halls in her apartment for exercise. She has yet to be informed of home exercise. She has been doing well in [facility] and has been able to increase some of her workloads. She has a rod in her left leg and sometimes it inhibits her from doing other exercises. Patient has been walking the halls in her apartment for exercise. She  has yet to be informed of home exercise. She has been doing well in [facility] and has been able to increase some of her workloads. She has a rod in her left leg and sometimes it inhibits her from doing other exercises. Reviewed home exercise with pt today.  Pt plans to wak and use videos at home for exercise.  Reviewed THR, pulse, RPE, sign and symptoms, pulse oximetery and when to call 911 or MD.  Also discussed weather considerations and indoor options.  Pt voiced understanding.   Expected Outcomes Short: Use RPE daily to regulate intensity. Long: Follow program prescription in THR. Short: start to increase workloads Long: Continue  follow program prescription Short: continue to walk at home and talk to EP about home exercise. Long: maintain an exercise routine at home independently pose [facility]. Short: continue to walk at home and talk to EP about home exercise. Long: maintain an exercise routine at home independently pose [facility]. Short: add one day of exercise outside program sessions   Row Name 04/24/20 1528 04/29/20 1000 05/08/20 1351 05/23/20 1129       Exercise Goal Re-Evaluation   Exercise Goals Review Increase Physical Activity;Increase Strength and Stamina;Understanding of Exercise Prescription Increase Physical Activity;Increase Strength and Stamina;Understanding of Exercise Prescription Increase Physical Activity;Increase Strength and Stamina;Understanding of Exercise Prescription Increase Physical Activity;Increase Strength and Stamina    Comments Mackenzie Walsh has been doing well in rehab.  She now on level 3 for the NuStep.  We will continue to monitor her progress. She sometimes will walk around the apartment for a set amount of time. discussed more structured exercise at home. Discussed importance of exercising outside of rehab. she has access to a gym at her residence. Wants to go with her husband because he goes as well. Discussed PLB and warm up/cool down. Mackenzie Walsh has moved up resistance to level 3 on T4.  We reviewed keeping SPM at 80-100 even with an increase in level to make sure you are improving. Mackenzie Walsh has 6 more sessions. She works at [employer] while in class.  We expect her to improve post .    Expected Outcomes Short: Continue to work on maintaining spm  Long: Continue to improve stamina. ST: walk 20 minutes of structured walking - will start at 5 minutes around the apartment 2x/week when not at rehab or use seated machines at gym for residents for at least 20 minutes. LT: exercise aerobically at least 20 minutes 5x/week. Resistance at least 2x/week. Short:  continue to exercise consistently Long: build  overall stamina Short: try level 4 on NS Long: improve 6 MWT           Discharge Exercise Prescription (Final Exercise Prescription Changes):  Exercise Prescription Changes - 05/23/20 1100      Response to Exercise   Blood Pressure (Admit) 92/60    Blood Pressure (Exercise) 92/52    Blood Pressure (Exit) 96/52    Heart Rate (Admit) 80 bpm    Heart Rate (Exercise) 80 bpm    Heart Rate (Exit) 80 bpm    Oxygen Saturation (Admit) 93 %    Oxygen Saturation (Exercise) 91 %    Oxygen Saturation (Exit) 96 %    Rating of Perceived Exertion (Exercise) 13    Perceived Dyspnea (Exercise) 1    Duration Continue with 30 min of aerobic exercise without signs/symptoms of physical distress.    Intensity THRR unchanged      Progression   Progression Continue to progress workloads to maintain intensity  without signs/symptoms of physical distress.    Average METs 1.2      Resistance Training   Training Prescription Yes    Weight 2 lb    Reps 10-15      Interval Training   Interval Training No      NuStep   Level 3    SPM 80    Minutes 15    METs 1.2           Nutrition:  Target Goals: Understanding of nutrition guidelines, daily intake of sodium 1500mg , cholesterol 200mg , calories 30% from fat and 7% or less from saturated fats, daily to have 5 or more servings of fruits and vegetables.  Education: All About Nutrition: -Group instruction provided by verbal, written material, interactive activities, discussions, models, and posters to present general guidelines for heart healthy nutrition including fat, fiber, MyPlate, the role of sodium in heart healthy nutrition, utilization of the nutrition label, and utilization of this knowledge for meal planning. Follow up email sent as well. Written material given at graduation. Flowsheet Row Pulmonary Rehab from 05/29/2020 in [facility] Cardiac and Pulmonary Rehab  Date 05/01/20  Educator [facility]  Instruction Review Code 1- Verbalizes Understanding       Biometrics:  Pre Biometrics - 03/12/20 1126      Pre Biometrics   Height 5' 2.2" (1.58 m)    Weight 133 lb (60.3 kg)    BMI (Calculated) 24.17    Single Leg Stand 1.3 seconds            Nutrition Therapy Plan and Nutrition Goals:  Nutrition Therapy & Goals - 03/18/20 0916      Nutrition Therapy   Diet Pulmonary MNT    Protein (specify units) 75g    Fiber 25 grams    Whole Grain Foods 3 servings    Saturated Fats 12 max. grams    Fruits and Vegetables 5 servings/day    Sodium 1.5 grams      Personal Nutrition Goals   Nutrition Goal ST: Add protein food to breakfast consistently - egg or protein shake for example, eat more moist foods to help with swallowing LT: improve SOB    Comments No problems breathing during or after eating. Reports having trouble swallowing. Doesn't like spicy food. Appetite is somewhat lower than it has been. B: blueberries with dry cereal and milk w/ oj or an egg with toast and sausage with a banana every morning. L: Toasted sandwich with ham and cheese, slice of cheese with [place] breast and tomato with a side salad. sometimes lunch from [employer] at [place]. S: fruit with some yogurt. D: dining hall - meat, starch, vegetables with salad. Drinks: OJ and decaf coffee, water and apple juice, decaf tea. Pt uses gluten free bread. Pt reports she has been losing some weight 138-133 in last few months. Discussed pulmonary medical nutrition therapy. Pt reported not not wanting to change much of what she is eating, is willing to add in more protein to breakfast.      Intervention Plan   Intervention Prescribe, educate and counsel regarding individualized specific dietary modifications aiming towards targeted core components such as weight, hypertension, lipid management, diabetes, heart failure and other comorbidities.;Nutrition handout(s) given to patient.    Expected Outcomes Short Term Goal: Understand basic principles of dietary content, such as  calories, fat, sodium, cholesterol and nutrients.;Short Term Goal: A plan has been developed with personal nutrition goals set during dietitian appointment.;Long Term Goal: Adherence to prescribed nutrition plan.  Nutrition Assessments:  Nutrition Assessments - 05/31/20 1000      MEDFICTS Scores   Post Score 37          MEDIFICTS Score Key:  ?70 Need to make dietary changes   40-70 Heart Healthy Diet  ? 40 Therapeutic Level Cholesterol Diet   Picture Your Plate Scores:  <16 Unhealthy dietary pattern with much room for improvement.  41-50 Dietary pattern unlikely to meet recommendations for good health and room for improvement.  51-60 More healthful dietary pattern, with some room for improvement.   >60 Healthy dietary pattern, although there may be some specific behaviors that could be improved.   Nutrition Goals Re-Evaluation:  Nutrition Goals Re-Evaluation    Row Name 04/03/20 1008 04/29/20 1013           Goals   Nutrition Goal ST: Add  protein shake on the mornings she does not eat eggs, continue to eat more moist foods to help with swallowing LT: improve SOB ST: continue with current changes, use swallowing difficulty techniques LT: swallow better, continue to meet protein needs      Comment She reports having an egg every other morning with some fruits. Will try a protien drink like ensure or boost. Worse time swallowing and will address it during her MD appointment tomorrow. She reports no changes at this time with her diet. She is having protein shake - doesn't know which - ensure? She still reports her swallowing is worse - she fell and hit her head on the floor and feels thats what is making it worse. Doesn't remember what MD said. reviewed techniques to help such as having moist foods.      Expected Outcome Have an easier time swallowing and meet protein needs. ST: continue with current changes, use swallowing difficulty techniques LT: swallow better,  continue to meet protein needs             Nutrition Goals Discharge (Final Nutrition Goals Re-Evaluation):  Nutrition Goals Re-Evaluation - 04/29/20 1013      Goals   Nutrition Goal ST: continue with current changes, use swallowing difficulty techniques LT: swallow better, continue to meet protein needs    Comment She reports no changes at this time with her diet. She is having protein shake - doesn't know which - ensure? She still reports her swallowing is worse - she fell and hit her head on the floor and feels thats what is making it worse. Doesn't remember what MD said. reviewed techniques to help such as having moist foods.    Expected Outcome ST: continue with current changes, use swallowing difficulty techniques LT: swallow better, continue to meet protein needs           Psychosocial: Target Goals: Acknowledge presence or absence of significant depression and/or stress, maximize coping skills, provide positive support system. Participant is able to verbalize types and ability to use techniques and skills needed for reducing stress and depression.   Education: Stress, Anxiety, and Depression - Group verbal and visual presentation to define topics covered.  Reviews how body is impacted by stress, anxiety, and depression.  Also discusses healthy ways to reduce stress and to treat/manage anxiety and depression.  Written material given at graduation. Flowsheet Row Pulmonary Rehab from 05/29/2020 in [facility] Cardiac and Pulmonary Rehab  Date 03/27/20  Educator [facility]  Instruction Review Code 1- [employer] Understanding      Education: Sleep Hygiene -Provides group verbal and written instruction about how sleep can affect your health.  Define sleep hygiene, discuss sleep cycles and impact of sleep habits. Review good sleep hygiene tips.    Initial Review & Psychosocial Screening:  Initial Psych Review & Screening - 03/11/20 1122      Initial Review   Current issues with Current Stress  Concerns    Source of Stress Concerns Family    Comments Husband diagnosed with stage 1 memory loss      Family Dynamics   Good Support System? Yes   Son and daughter in law.  husband of 58     Barriers   Psychosocial barriers to participate in program There are no identifiable barriers or psychosocial needs.;The patient should benefit from training in stress management and relaxation.      Screening Interventions   Interventions Encouraged to exercise;To provide support and resources with identified psychosocial needs;Provide feedback about the scores to participant    Expected Outcomes Short Term goal: Utilizing psychosocial counselor, staff and physician to assist with identification of specific Stressors or current issues interfering with healing process. Setting desired goal for each stressor or current issue identified.;Long Term Goal: Stressors or current issues are controlled or eliminated.;Short Term goal: Identification and review with participant of any Quality of Life or Depression concerns found by scoring the questionnaire.;Long Term goal: The participant improves quality of Life and PHQ9 Scores as seen by post scores and/or verbalization of changes           Quality of Life Scores:  Scores of 19 and below usually indicate a poorer quality of life in these areas.  A difference of  2-3 points is a clinically meaningful difference.  A difference of 2-3 points in the total score of the Quality of Life Index has been associated with significant improvement in overall quality of life, self-image, physical symptoms, and general health in studies assessing change in quality of life.  PHQ-9: Recent Review Flowsheet Data    Depression screen [facility] 2/9 05/31/2020 03/12/2020   Decreased Interest 0 1   Down, Depressed, Hopeless 0 0   PHQ - 2 Score 0 1   Altered sleeping 0 1   Tired, decreased energy 1 2   Change in appetite 0 0   Feeling bad or failure about yourself  0 0   Trouble  concentrating 1 0   Moving slowly or fidgety/restless 0 0   Suicidal thoughts 0 0   PHQ-9 Score 2 4   Difficult doing work/chores Not difficult at all Somewhat difficult     Interpretation of Total Score  Total Score Depression Severity:  1-4 = Minimal depression, 5-9 = Mild depression, 10-14 = Moderate depression, 15-19 = Moderately severe depression, 20-27 = Severe depression   Psychosocial Evaluation and Intervention:  Psychosocial Evaluation - 03/11/20 1132      Psychosocial Evaluation & Interventions   Interventions Encouraged to exercise with the program and follow exercise prescription    Comments Mackenzie Walsh is coming to the program without any major barriers. She is concerned about her husband of 60 years that has been recently diagnosed with Stage 1 Memory Loss. They have a support system with their son and duaghter in law that live in [place] and they live in a continous care community and have all the resources there for them to utilize. She hope to be able to breath better after completing the program.    Expected Outcomes STG: Amreen will attend her scheduled sessions. LTG: Mackenzie Walsh will continue to follow the exercise and eduction guidelines to maintain  what she gained in the program.    Continue Psychosocial Services  Follow up required by staff           Psychosocial Re-Evaluation:  Psychosocial Re-Evaluation    Row Name 04/10/20 1020 04/29/20 1008           Psychosocial Re-Evaluation   Current issues with None Identified Current Anxiety/Panic;Current Stress Concerns      Comments Patient reports no issues with their current mental states, sleep, stress, depression or anxiety. Will follow up with patient in a few weeks for any changes. She reports anxiety with shortness of breath which she feels has gotten worse. She feels she is sleeping well and getting about 6-7 hours of sleep per night. She reports he husband has memory issues and has a caregiver most days of  the week which causes her stress. She watches the news and musical programs, sits on balcony to reduce stress. Discussed other stress management. She reports having a good support system in her daughter in-law and son 30 minutes away and sees them at least 1x/week. She still drives.      Expected Outcomes Short: Continue to exercise regularly to support mental health and notify staff of any changes. Long: maintain mental health and well being through teaching of rehab or prescribed medications independently. ST; continue to exercise regularly to improve shortness of breath. Continue stress management techniques. LT: manage stress and anxiety, improve shortness of breath.      Interventions Encouraged to attend Pulmonary Rehabilitation for the exercise Encouraged to attend Pulmonary Rehabilitation for the exercise      Continue Psychosocial Services  Follow up required by staff Follow up required by staff      Comments -- Husband diagnosed with stage 1 memory loss, issues with shortness of breath, rod in her leg and back pain.             Initial Review   Source of Stress Concerns -- Family;Chronic Illness             Psychosocial Discharge (Final Psychosocial Re-Evaluation):  Psychosocial Re-Evaluation - 04/29/20 1008      Psychosocial Re-Evaluation   Current issues with Current Anxiety/Panic;Current Stress Concerns    Comments She reports anxiety with shortness of breath which she feels has gotten worse. She feels she is sleeping well and getting about 6-7 hours of sleep per night. She reports he husband has memory issues and has a caregiver most days of the week which causes her stress. She watches the news and musical programs, sits on balcony to reduce stress. Discussed other stress management. She reports having a good support system in her daughter in-law and son 30 minutes away and sees them at least 1x/week. She still drives.    Expected Outcomes ST; continue to exercise regularly to  improve shortness of breath. Continue stress management techniques. LT: manage stress and anxiety, improve shortness of breath.    Interventions Encouraged to attend Pulmonary Rehabilitation for the exercise    Continue Psychosocial Services  Follow up required by staff    Comments Husband diagnosed with stage 1 memory loss, issues with shortness of breath, rod in her leg and back pain.      Initial Review   Source of Stress Concerns Family;Chronic Illness           Education: Education Goals: Education classes will be provided on a weekly basis, covering required topics. Participant will state understanding/return demonstration of topics presented.  Learning Barriers/Preferences:  Learning  Barriers/Preferences - 03/11/20 1125      Learning Barriers/Preferences   Learning Barriers None    Learning Preferences None           General Pulmonary Education Topics:  Infection Prevention: - Provides verbal and written material to individual with discussion of infection control including proper hand washing and proper equipment cleaning during exercise session. Flowsheet Row Pulmonary Rehab from 05/29/2020 in [facility] Cardiac and Pulmonary Rehab  Date 03/12/20  Educator [facility]  Instruction Review Code 1- Verbalizes Understanding      Falls Prevention: - Provides verbal and written material to individual with discussion of falls prevention and safety. Flowsheet Row Pulmonary Rehab from 05/29/2020 in [facility] Cardiac and Pulmonary Rehab  Date 03/11/20  Educator SB  Instruction Review Code 1- Verbalizes Understanding      Chronic Lung Disease Review: - Group verbal instruction with posters, models, PowerPoint presentations and videos,  to review new updates, new respiratory medications, new advancements in procedures and treatments. Providing information on websites and "800" numbers for continued self-education. Includes information about supplement oxygen, available portable oxygen systems,  continuous and intermittent flow rates, oxygen safety, concentrators, and Medicare reimbursement for oxygen. Explanation of Pulmonary Drugs, including class, frequency, complications, importance of spacers, rinsing mouth after steroid MDI's, and proper cleaning methods for nebulizers. Review of basic lung anatomy and physiology related to function, structure, and complications of lung disease. Review of risk factors. Discussion about methods for diagnosing sleep apnea and types of masks and machines for OSA. Includes a review of the use of types of environmental controls: home humidity, furnaces, filters, dust mite/pet prevention, HEPA vacuums. Discussion about weather changes, air quality and the benefits of nasal washing. Instruction on Warning signs, infection symptoms, calling MD promptly, preventive modes, and value of vaccinations. Review of effective airway clearance, coughing and/or vibration techniques. Emphasizing that all should Create an Action Plan. Written material given at graduation. Flowsheet Row Pulmonary Rehab from 05/29/2020 in [facility] Cardiac and Pulmonary Rehab  Date 05/22/20  Educator [facility]  Instruction Review Code 1- Verbalizes Understanding      AED/CPR: - Group verbal and written instruction with the use of models to demonstrate the basic use of the AED with the basic ABC's of resuscitation.    Anatomy and Cardiac Procedures: - Group verbal and visual presentation and models provide information about basic cardiac anatomy and function. Reviews the testing methods done to diagnose heart disease and the outcomes of the test results. Describes the treatment choices: Medical Management, Angioplasty, or Coronary Bypass Surgery for treating various heart conditions including Myocardial Infarction, Angina, Valve Disease, and Cardiac Arrhythmias.  Written material given at graduation.   Medication Safety: - Group verbal and visual instruction to review commonly prescribed medications for  heart and lung disease. Reviews the medication, class of the drug, and side effects. Includes the steps to properly store meds and maintain the prescription regimen.  Written material given at graduation. Flowsheet Row Pulmonary Rehab from 05/29/2020 in [facility] Cardiac and Pulmonary Rehab  Date 05/08/20  Educator SB  Instruction Review Code 1- Verbalizes Understanding      Other: -Provides group and verbal instruction on various topics (see comments)   Knowledge Questionnaire Score:  Knowledge Questionnaire Score - 05/31/20 1000      Knowledge Questionnaire Score   Pre Score 17/18 Education Focus: O2 safety    Post Score 13/18            Core Components/Risk Factors/Patient Goals at Admission:  Personal Goals and  Risk Factors at Admission - 03/12/20 1128      Core Components/Risk Factors/Patient Goals on Admission    Weight Management Yes;Weight Maintenance    Intervention Weight Management: Develop a combined nutrition and exercise program designed to reach desired caloric intake, while maintaining appropriate intake of nutrient and fiber, sodium and fats, and appropriate energy expenditure required for the weight goal.;Weight Management: Provide education and appropriate resources to help participant work on and attain dietary goals.    Admit Weight 133 lb (60.3 kg)    Goal Weight: Short Term 133 lb (60.3 kg)    Goal Weight: Long Term 130 lb (59 kg)    Expected Outcomes Short Term: Continue to assess and modify interventions until short term weight is achieved;Long Term: Adherence to nutrition and physical activity/exercise program aimed toward attainment of established weight goal;Weight Maintenance: Understanding of the daily nutrition guidelines, which includes 25-35% calories from fat, 7% or less cal from saturated fats, less than 200mg  cholesterol, less than 1.5gm of sodium, & 5 or more servings of fruits and vegetables daily    Improve shortness of breath with ADL's Yes     Intervention Provide education, individualized exercise plan and daily activity instruction to help decrease symptoms of SOB with activities of daily living.    Expected Outcomes Short Term: Improve cardiorespiratory fitness to achieve a reduction of symptoms when performing ADLs;Long Term: Be able to perform more ADLs without symptoms or delay the onset of symptoms    Hypertension Yes    Intervention Provide education on lifestyle modifcations including regular physical activity/exercise, weight management, moderate sodium restriction and increased consumption of fresh fruit, vegetables, and low fat dairy, alcohol moderation, and smoking cessation.;Monitor prescription use compliance.    Expected Outcomes Short Term: Continued assessment and intervention until BP is < 140/40mm HG in hypertensive participants. < 130/63mm HG in hypertensive participants with diabetes, heart failure or chronic kidney disease.;Long Term: Maintenance of blood pressure at goal levels.    Lipids Yes    Intervention Provide education and support for participant on nutrition & aerobic/resistive exercise along with prescribed medications to achieve LDL 70mg , HDL >40mg .    Expected Outcomes Short Term: Participant states understanding of desired cholesterol values and is compliant with medications prescribed. Participant is following exercise prescription and nutrition guidelines.;Long Term: Cholesterol controlled with medications as prescribed, with individualized exercise RX and with personalized nutrition plan. Value goals: LDL < 70mg , HDL > 40 mg.           Education:Diabetes - Individual verbal and written instruction to review signs/symptoms of diabetes, desired ranges of glucose level fasting, after meals and with exercise. Acknowledge that pre and post exercise glucose checks will be done for 3 sessions at entry of program.   Know Your Numbers and Heart Failure: - Group verbal and visual instruction to discuss  disease risk factors for cardiac and pulmonary disease and treatment options.  Reviews associated critical values for Overweight/Obesity, Hypertension, Cholesterol, and Diabetes.  Discusses basics of heart failure: signs/symptoms and treatments.  Introduces Heart Failure Zone chart for action plan for heart failure.  Written material given at graduation. Flowsheet Row Pulmonary Rehab from 05/29/2020 in [facility] Cardiac [facility] Pulmonary Re[facility]  Date 05/15/20  Educator SB  Instruction Review Code 1- Verbalizes Understanding      Core Components/Risk Factors/Patient Goals Review:   Goals and Risk Factor Review    Row Name 04/10/20 1019 04/29/20 1017           Core Components/Risk Factors/Patient  Goals Review   Personal Goals Review Improve shortness of breath with ADL's Improve shortness of breath with ADL's      Review Spoke to patient about their shortness of breath and what they can do to improve. Patient has been informed of breathing techniques when starting the program. Patient is informed to tell staff if they have had any med changes and that certain meds they are taking or not taking can be causing shortness of breath. Spoke to patient about their shortness of breath and what she can do to improve including exercising at home attending rehab and practicing pursed lip breathing.      Expected Outcomes Short: Attend [facility] regularly to improve shortness of breath with ADL's. Long: maintain independence with ADL's Short: Attend [facility] regularly to improve shortness of breath with ADL's. Exercise at home, use PLB Long: maintain independence with ADL's, improve shortness of breath             Core Components/Risk Factors/Patient Goals at Discharge (Final Review):   Goals and Risk Factor Review - 04/29/20 1017      Core Components/Risk Factors/Patient Goals Review   Personal Goals Review Improve shortness of breath with ADL's    Review Spoke to patient about their shortness of breath and  what she can do to improve including exercising at home attending rehab and practicing pursed lip breathing.    Expected Outcomes Short: Attend [facility] regularly to improve shortness of breath with ADL's. Exercise at home, use PLB Long: maintain independence with ADL's, improve shortness of breath           ITP Comments:  ITP Comments    Row Name 03/11/20 1138 03/12/20 1118 03/13/20 1140 03/18/20 0933 03/27/20 0522   ITP Comments Virtual orientation call completed today. shehas an appointment on Date: 03/12/2020 for EP eval and gym Orientation.  Documentation of diagnosis [facility] be [facility] in [facility] Date: 01/30/2020. Completed and gym orientation. Initial ITP created and sent for review to Dr. Bethann Punches, Medical Director. First full day of exercise!  Patient was oriented to gym and equipment including functions, settings, policies, and procedures.  Patient's individual exercise prescription and treatment plan were reviewed.  All starting workloads were established based on the results of the 6 minute walk test done at initial orientation visit.  The plan for exercise progression was also introduced and progression will be customized based on patient's performance and goals. Completed Initial RD evaluation 30 Day review completed. Medical Director ITP review done, changes made as directed, and signed approval by Medical Director.   Row Name 04/24/20 [phone] 05/22/20 0914 06/05/20 1000       ITP Comments 30 Day review completed. Medical Director ITP review done, changes made as directed, and signed approval by Medical Director. 30 Day review completed. Medical Director ITP review done, changes made as directed, and signed approval by Medical Director. Kayana graduated today from  rehab with 36 sessions completed.  Details of the patient's exercise prescription and what She needs to do in order to continue the prescription and progress were discussed with patient.  Patient was given a copy of  prescription and goals.  Patient verbalized understanding.  Hser plans to continue to exercise by exercising at her community gym .            Comments: Discharge ITP

## 2020-06-05 NOTE — Progress Notes (Signed)
Daily Session Note  Patient Details  Name: Mackenzie Walsh MRN: [id number] Date of Birth: July 30, 1931 Referring Provider:   Flowsheet Row Pulmonary Rehab from 03/12/2020 in [facility] Cardiac and Pulmonary Rehab  Referring Provider Lanney Gins, Fraud MD      Encounter Date: 06/05/2020  Check In:  Session Check In - 06/05/20 0958      Check-In   Supervising physician immediately available to respond to emergencies See telemetry face sheet for immediately available ER MD    Location [facility]-Cardiac & Pulmonary Rehab    Staff Present [name], RN, BSN, CCRP;Joseph Hood RCP,RRT,BSRT;Amanda Samson, [place], ACSM CEP, Exercise Physiologist    Virtual Visit No    Medication changes reported     No    Fall or balance concerns reported    No    Warm-up and Cool-down Performed on first and last piece of equipment    Resistance Training Performed Yes    VAD Patient? No    PAD/SET Patient? No      Pain Assessment   Currently in Pain? No/denies              Social History   Tobacco Use  Smoking Status Never Smoker  Smokeless Tobacco Never Used    Goals Met:  Proper associated with RPD/PD & O2 Sat Independence with exercise equipment Exercise tolerated well No report of cardiac concerns or symptoms  Goals Unmet:  Not Applicable  Comments:  Mackenzie Walsh graduated today from  rehab with 36 sessions completed.  Details of the patient's exercise prescription and what She needs to do in order to continue the prescription and progress were discussed with patient.  Patient was given a copy of prescription and goals.  Patient verbalized understanding.  Mackenzie Walsh plans to continue to exercise by exercising at her community gym .    Dr. Emily Filbert is Medical Director for [facility] and [facility].

## 2020-06-05 NOTE — Progress Notes (Signed)
Discharge Progress Report  Patient Details  Name: Mackenzie Walsh MRN: [id number] Date of Birth: [birth year] Referring Provider:   Flowsheet Row Pulmonary Rehab from 03/12/2020 in [facility] Cardiac and Pulmonary Rehab  Referring Provider Karna Christmas, Fraud MD       Number of Visits: 47  Reason for Discharge:  Patient reached a stable level of exercise. Patient independent in their exercise.  Smoking History:  Social History   Tobacco Use  Smoking Status Never Smoker  Smokeless Tobacco Never Used    Diagnosis:  Chronic respiratory failure with hypoxia (HCC)  ADL UCSD:  Pulmonary Assessment Scores    Row Name 03/12/20 1129 05/31/20 0956       ADL UCSD   ADL Phase Entry Exit    SOB Score total 61 60    Rest 0 0    Walk 3 2    Stairs 5 4    Bath 2 3    Dress 2 2    Shop 3 3         CAT Score   CAT Score 12 19         mMRC Score   mMRC Score 3 3           Initial Exercise Prescription:  Initial Exercise Prescription - 03/12/20 1100      Date of Initial Exercise RX and Referring Provider   Date 03/12/20    Referring Provider Karna Christmas, Fraud MD      Treadmill   MPH 0.7    Grade 0    Minutes 15    METs 1.5      Recumbant Bike   Level 1    RPM 50    Watts 5    Minutes 15    METs 1.5      NuStep   Level 1    SPM 80    Minutes 15    METs 1.5      REL-XR   Level 1    Speed 50    Minutes 15    METs 1.5      Prescription Details   Frequency (times per week) 3    Duration Progress to 30 minutes of continuous aerobic without signs/symptoms of physical distress      Intensity   THRR 40-80% of Max Heartrate 102-123    Ratings of Perceived Exertion 11-13    Perceived Dyspnea 0-4      Progression   Progression Continue to progress workloads to maintain intensity without signs/symptoms of physical distress.      Resistance Training   Training Prescription Yes    Weight 2 lb    Reps 10-15           Discharge Exercise Prescription (Final  Exercise Prescription Changes):  Exercise Prescription Changes - 05/23/20 1100      Response to Exercise   Blood Pressure (Admit) 92/60    Blood Pressure (Exercise) 92/52    Blood Pressure (Exit) 96/52    Heart Rate (Admit) 80 bpm    Heart Rate (Exercise) 80 bpm    Heart Rate (Exit) 80 bpm    Oxygen Saturation (Admit) 93 %    Oxygen Saturation (Exercise) 91 %    Oxygen Saturation (Exit) 96 %    Rating of Perceived Exertion (Exercise) 13    Perceived Dyspnea (Exercise) 1    Duration Continue with 30 min of aerobic exercise without signs/symptoms of physical distress.    Intensity THRR unchanged  Progression   Progression Continue to progress workloads to maintain intensity without signs/symptoms of physical distress.    Average METs 1.2      Resistance Training   Training Prescription Yes    Weight 2 lb    Reps 10-15      Interval Training   Interval Training No      NuStep   Level 3    SPM 80    Minutes 15    METs 1.2           Functional Capacity:  6 Minute Walk    Row Name 03/12/20 1120 05/27/20 1142       6 Minute Walk   Phase Initial Discharge    Distance 410 feet 400 feet    Walk Time 4.32 minutes 4.5 minutes    # of Rest Breaks 3  7 sec, 50 sec, 32 sec 3    MPH 1.08 1.01    METS 0.52 0.9    RPE 17 17    Perceived Dyspnea  4 4    VO2 Peak 1.82 3.1    Symptoms Yes (comment) Yes (comment)    Comments SOB SOB    Resting HR 81 bpm 80 bpm    Resting BP 130/64 134/62    Resting Oxygen Saturation  94 % 95 %    Exercise Oxygen Saturation  during 6 min walk 89 % 85 %    Max Ex. HR 95 bpm 131 bpm    Max Ex. BP 134/66 134/64    2 Minute Post BP 128/64 132/62         Interval HR   1 Minute HR 95 --    2 Minute HR 90 --    3 Minute HR 85 --    4 Minute HR 89 --    5 Minute HR 83 --    6 Minute HR 94 131    2 Minute Post HR 80 81    Interval Heart Rate? Yes Yes         Interval Oxygen   Interval Oxygen? Yes --    Baseline Oxygen Saturation %  94 % 95 %    1 Minute Oxygen Saturation % 92 % --    1 Minute Liters of Oxygen 0 L  Room Air 0 L    2 Minute Oxygen Saturation % 89 % --    2 Minute Liters of Oxygen 0 L 0 L    3 Minute Oxygen Saturation % 92 % --    3 Minute Liters of Oxygen 0 L 0 L    4 Minute Oxygen Saturation % 90 % --    4 Minute Liters of Oxygen 0 L 0 L    5 Minute Oxygen Saturation % 91 % --    5 Minute Liters of Oxygen 0 L 0 L    6 Minute Oxygen Saturation % 89 % 85 %    6 Minute Liters of Oxygen 0 L 0 L    2 Minute Post Oxygen Saturation % 94 % 93 %    2 Minute Post Liters of Oxygen 0 L 0 L           Nutrition & Weight - Outcomes:  Pre Biometrics - 03/12/20 1126      Pre Biometrics   Height 5' 2.2" (1.58 m)    Weight 133 lb (60.3 kg)    BMI (Calculated) 24.17    Single Leg Stand 1.3 seconds  Nutrition:  Nutrition Therapy & Goals - 03/18/20 0916      Nutrition Therapy   Diet Pulmonary MNT    Protein (specify units) 75g    Fiber 25 grams    Whole Grain Foods 3 servings    Saturated Fats 12 max. grams    Fruits and Vegetables 5 servings/day    Sodium 1.5 grams      Personal Nutrition Goals   Nutrition Goal ST: Add protein food to breakfast consistently - egg or protein shake for example, eat more moist foods to help with swallowing LT: improve SOB    Comments No problems breathing during or after eating. Reports having trouble swallowing. Doesn't like spicy food. Appetite is somewhat lower than it has been. B: blueberries with dry cereal and milk w/ oj or an egg with toast and sausage with a banana every morning. L: Toasted sandwich with ham and cheese, slice of cheese with [place] breast and tomato with a side salad. sometimes lunch from [employer] at [place]. S: fruit with some yogurt. D: dining hall - meat, starch, vegetables with salad. Drinks: OJ and decaf coffee, water and apple juice, decaf tea. Pt uses gluten free bread. Pt reports she has been losing some weight 138-133 in  last few months. Discussed pulmonary medical nutrition therapy. Pt reported not not wanting to change much of what she is eating, is willing to add in more protein to breakfast.      Intervention Plan   Intervention Prescribe, educate and counsel regarding individualized specific dietary modifications aiming towards targeted core components such as weight, hypertension, lipid management, diabetes, heart failure and other comorbidities.;Nutrition handout(s) given to patient.    Expected Outcomes Short Term Goal: Understand basic principles of dietary content, such as calories, fat, sodium, cholesterol and nutrients.;Short Term Goal: A plan has been developed with personal nutrition goals set during dietitian appointment.;Long Term Goal: Adherence to prescribed nutrition plan.           Goals reviewed with patient; copy given to patient.

## 2020-06-11 ENCOUNTER — Other Ambulatory Visit: Payer: Self-pay

## 2020-06-11 ENCOUNTER — Ambulatory Visit
Admission: RE | Admit: 2020-06-11 | Discharge: 2020-06-11 | Disposition: A | Discharge status: Home / Self Care | Payer: Medicare Other | Source: Ambulatory Visit | Attending: Pulmonary Disease | Admitting: Pulmonary Disease

## 2020-06-11 DIAGNOSIS — R0600 Dyspnea, unspecified: Secondary | ICD-10-CM | POA: Diagnosis not present

## 2020-07-28 ENCOUNTER — Encounter (INDEPENDENT_AMBULATORY_CARE_PROVIDER_SITE_OTHER): Payer: Medicare Other | Admitting: Cardiovascular Disease

## 2020-07-28 DIAGNOSIS — Z95810 Presence of automatic (implantable) cardiac defibrillator: Secondary | ICD-10-CM

## 2020-09-04 ENCOUNTER — Telehealth (INDEPENDENT_AMBULATORY_CARE_PROVIDER_SITE_OTHER): Payer: Self-pay

## 2020-09-04 NOTE — Telephone Encounter (Addendum)
Spoke with pt re: need for overdue device check. Pt states she has moved to [facility] and has had her device checked at her new cardiology office. Requested information for new cardiologist so may transfer remote transmissions. Pt states she will call back to provide to ensure her new physician is fully managing her device. [facility] number provided for call back so transfer of care may be initiated. Pt verbalized understanding.     ----- Message from Cathren Harsh sent at 07/30/2020  8:23 AM EST -----  Regarding: St. Jude (crt-d)  Patient need a device check

## 2020-09-04 NOTE — Telephone Encounter (Addendum)
Pt released in [facility].  When she calls back please tell her that her new practice can transfer her into their clinic.  We will continue to monitor her until transferred.     I called her to get info for new practice but she could not find it

## 2020-09-24 NOTE — Progress Notes (Signed)
[facility] OFFICE  [address] [place], [place] [address]     [name], [place]    Date of Visit:  12/16/2016  Date of Birth: 1931/07/14  Age: 87 yrs.   Medical Record Number: [id number]  __  CURRENT DIAGNOSES     1. Mitral valve insufficiency nonrheumatic,  I34.0  2. 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 Atrial Fibrillation, Paroxysmal, I48.0  4. Arrhythmia-PVCs symptomatic, I49.3  5. CHF chronic systolic, I50.22  6. Atrial Septal Defect, Q21.1  7. Status post atrial septal defect repair,  Z98.890  8. Shortness of breath, R06.02  9. Device check automatic implantable cardiac defibrillator, Z45.02  10. Long term (current) use of anticoagulants, Z79.01  11. Cardiac arrest, history of, Z86.74  12. Unspecified atrial fibrillation,  I48.91  __  ALLERGIES    Erythromycin Base, Rash  Tikosyn, Intolerance-unknown   __  MEDICATIONS     1. Align 4 mg capsule, Take as Directed  2. carvedilol 12.5 mg tablet, 1 po bid  3. Citracal + D Slow Release 600 mg  calcium-500 unit tablet,ext.release, 2 po qd  4. fluticasone 100 mcg/actuation blister powder for inhalation, Take as Directed  5. Fosamax 70 mg tablet, 1 po qweek  6. gabapentin 100 mg capsule, 1 po tid  7. Lasix 40 mg tablet, 1 po qd   8. magnesium 250 mg tablet, 1 po qd  9. multivitamin capsule, 1 po qd  10. potassium chloride ER 10 mEq tablet,extended release, 1 po bid  11. simvastatin 10 mg tablet, 1 po qhs  12. Synthroid 112 mcg tablet, 1 po qd  13. Tylenol Extra  Strength 500 mg tablet, 1 po tid  14. Vitron-C 65 mg iron-125 mg tablet,delayed release, 2 x per week  15. Xarelto 20 mg tablet, 1 po qd  __  CHIEF COMPLAINT/REASON FOR VISIT   Followup of Atrial Fibrillation, Paroxysmal, Followup of Atrial Septal Defect and Followup of CHF chronic systolic  __  HISTORY OF PRESENT ILLNESS   Mrs. Pickerill returns for clinical followup after recent hospitalization for gastrointestinal bleeding. Ultimately, video capsulography showed ileal lymphangiectasia. There was no  bleeding, and the patient was restarted on Xarelto and Plavix. She has not  had any bleeding since that time. She was transfused for presenting hemoglobin of 7.1. She feels fatigued but otherwise is doing reasonably well. She thinks that her weight is gradually increasing after discharge and will be keeping an eye on it.   __  PAST HISTORY     Past Medical Illnesses:  celiac disease, Chronic back pain, Intermittent steroid spinal injections, COPD;  Past Cardiac Illnesses : Chest pain 2007, arrythmia 2007, PFO, Atrial fibrillation-persistent, Mitral regurgitation; Infectious Diseases: Staph infection at age 57 -37, Chicken  Pox; Surgical Procedures: Tonsilectomy,Apandectomy , Knee surgery 2002, total R knee replacement, ASD Repair;  Trauma History: whip lash 1980; Cardiology Procedures-Invasive: Echo 08, Stress test 08, Cardiac Cath March  2014, Cardioversion May 2015, Cardioversion July 2015, Cardioversion for Afib August 2016, Cardiac Cath (left and right) October 2017, St Jude dual chamber ICD 05/25/16, Cardiac Cath (right) May 2018, ASD closure 11/11/16, ASD closure 11/11/16;  Cardiology Procedures-Noninvasive: Echocardiogram June 2010, CT scan 01/27/07, Treadmill Stress Test, MPI (Nuclear) Study January 2013, Echocardiogram January 2013, TEE February 2013, Echocardiogram December  2013, TEE January 2014, Echocardiogram February 2015, Holter Monitor July 2015, Echocardiogram November 2017, Echocardiogram May 2018; Cardiac Cath Results : no significant obstructive CAD, mild pulm HTN, R to L shunt with Qp:Qs 1.6, LVEDP ; Left Ventricular  Ejection Fraction: LVEF of 20% documented  via echocardiogram on 11/12/2016  SOCIAL HISTORY    Alcohol Use : Does not use alcohol; Smoking: Does not smoke; Never smoker ([id number]); Diet : Regular diet and Caffeine use-1-2 per day; Lifestyle: Married, 2 sons - one in [place] and second in [place];  Exercise: Exercises occasionally;   __  PHYSICAL EXAMINATION     Vital Signs:  Blood  Pressure:  122/80 Sitting, Left arm, regular cuff  122/76 Sitting, Right arm,  regular cuff    Weight: 131.00 lbs.  Height: 64"   BMI: 22   Pulse: 76/min.        Constitutional: Well developed, well nourished, in no acute distress Skin: warm and dry to touch  Head: normocephalic Eyes: conjunctivae and lids normal,  visual fields not performed ENT: No pallor or cyanosis  Neck: JVD noted at 90 degrees, carotid pulses are full and equal bilaterally, no bruits Chest : normal respiratory effort, fine rales both bases Cardiac: Irregularly irregular rhythm with variable 1st heart sound, normal 2nd heart sound, low pitched  holosystolic murmur grade 2/6 left sternal border Abdomen: bowel sounds normoactive, no bruits Peripheral  Pulses: pulses full and equal in all extremities Extremities/Back : 1+ bilateral ankle edema Psychiatric: normal memory  Neurological: oriented to time, person and place, affect appropriate   __    Medications added  today by the physician:    IMPRESSIONS:   1. Chronic systolic congestive heart failure with initial  improvement after ASD closure.   Metolazone discontinued November 23, 2016.  2. Gastrointestinal bleeding resulting in hospitalization December 04, 2016 to December 11, 2016, now   resolved.  3. ASD closure on Nov 11, 2016, for transseptal ASD with  left to right shunt. Planned Plavix for   three months.  4. Nonischemic cardiomyopathy with EF 25% November 2017 and 20% to 25% after previous   improvement Nov 12, 2016.  5. Recurrent atrial fibrillation status post failure of amiodarone  and extensive left and right atrial   focus impulsive rotor ablation with pulmonary vein isolation September 2017. Chronic Xarelto   for anticoagulation.  6. Out-of-hospital cardiac arrest December 2017, on Tikosyn and also with progressive  decline in   left ventricular function, status post ICD December 2017.  7. Kyphoscoliosis with moderate restriction.  8. Hypothyroidism.  9. Severe DLCO abnormality, followed by Dr.  Stacy Gardner.  10. Hypothyroidism, on Synthroid.   11. Intolerance to ACE inhibitor due to hypotension.    RECOMMENDATIONS:   1. The patient is going to keep an eye on her weight and may require  up titration of her diuretic   regimen.  2. We are going to check electrolytes and a CBC.  3. Return clinical followup in six weeks as long as she remains clinically stable.    Encarnacion Slates, MD     Tid: [id number]     cc: Danford Bad MD    shj  ____________________________  Christianne Dolin   Return Visit 15 MIN 6 weeks

## 2020-09-24 NOTE — Progress Notes (Signed)
[name]  [address] [place], [place] [address]     [name], [place]    Date of Visit:  08/19/2015  Date of Birth: Dec 17, 1931  Age: 87 yrs.   Medical Record Number: [id number]  __  CURRENT DIAGNOSES     1. Mitral valve insufficiency nonrheumatic,  I34.0  2. Unspecified atrial fibrillation, I48.91  3. Shortness of breath, R06.02  4. Long term (current) use of anticoagulants, Z79.01  __  ALLERGIES     Erythromycin Base, Rash  __  MEDICATIONS     1. Fosamax 70 mg tablet, 1 po qweek  2. Align  4 mg capsule, Take as Directed  3. gabapentin 100 mg capsule, 1 po tid  4. magnesium 250 mg tablet, 1 po qd  5. Tylenol Extra Strength 500 mg tablet, 1 po tid  6. multivitamin capsule, 1 po qd  7. Claritin 10 mg tablet, 1 po qd   8. lisinopril 5 mg tablet, 1 po qd  9. Synthroid 112 mcg tablet, 1 po qd  10. Vitron-C 65 mg iron-125 mg tablet,delayed release, 2 x per week  11. Lasix 20 mg tablet, 1 po qod  12. amiodarone 200 mg tablet, 1 po qd  13. Citracal +  D Slow Release 600 mg calcium-500 unit tablet,ext.release, 2 po qd  14. CoQ-10 100 mg capsule, 2 po qd  15. Warfarin 5 Mg Tablet, 1 po qd or as directed by [facility]  16. benzonatate 100 mg capsule, 1 po qd  17. simvastatin 10 mg tablet,  1 po qhs  __  CHIEF COMPLAINT/REASON FOR VISIT  Followup of Mitral valve insufficiency nonrheumatic and Followup of Unspecified atrial fibrillation   __  HISTORY OF PRESENT ILLNESS  [name] returns for clinical followup. Unfortunately, she has been suffering from a nonproductive cough for the  last few days. She went to the [facility] this morning and was told that it was not strep throat. She was prescribed Tessalon Perles for symptomatic relief. She thinks that she may have been getting slightly more short of breath over the last month  and a half, but is in sinus rhythm clinically today. She reports that her heart rates and blood pressures have been under good control.  __  PAST HISTORY      Past Medical  Illnesses: celiac disease, Chronic back pain, Intermittent steroid spinal injections, COPD;   Past Cardiac Illnesses: Chest pain 2007, arrythmia 2007, PFO, Atrial fibrillation-persistent, Mitral regurgitation; Infectious Diseases : Staph infection at age 7 -17, Chicken Pox; Surgical Procedures: Tonsilectomy,Apandectomy , Knee surgery 2002;  Trauma History: whip lash 1980; Cardiology Procedures-Invasive: Echo 08, Stress test 08, Cardiac Cath March  2014, Cardioversion May 2015, Cardioversion July 2015, Cardioversion for Afib August 2016; Cardiology Procedures-Noninvasive: Echocardiogram June 2010,  CT scan 01/27/07, Treadmill Stress Test, MPI (Nuclear) Study January 2013, Echocardiogram January 2013, TEE February 2013, Echocardiogram December 2013, TEE January 2014, Echocardiogram February 2015, Holter Monitor July 2015;  Cardiac Cath Results: 08/2012: EF 45%; RCA 20% stenosis in prox.; mild pulm. htn, no significant obstructive CAD.; Left Ventricular Ejection Fraction : LVEF of 60% documented via echocardiogram on 07/28/2013  SOCIAL HISTORY     Alcohol Use: Does not use alcohol; Smoking: Does not smoke; Never smoker ([id number]);  Diet: Regular diet and Caffeine use-1-2 per day; Lifestyle: Married;  Exercise: Exercises occasionally;   __  PHYSICAL EXAMINATION    Vital Signs:   Blood Pressure:  120/72 Sitting, Left arm, regular cuff  122/70 Sitting,  Right arm, regular cuff    Weight:  148.20 lbs.  Height: 65"  BMI: 24    Pulse: 60/min. Apical       Constitutional: Well developed, well nourished, in no acute distress  Skin: warm and dry to touch Head: normocephalic, normal female hair pattern  Eyes: conjunctivae and lids unremarkable, EOMS intact ENT: No pallor or cyanosis  Neck: carotid pulses are full and equal bilaterally, no JVD, no bruits Chest: Normal symmetry, clear  to auscultation bilaterally, kyphosis, cough Cardiac: Regular rhythm, S1 normal, S2 normal, No S3 or S4, Apical impulse not displaced, no murmurs,  gallops  or rubs detected. Abdomen: bowel sounds normoactive, no bruits Peripheral Pulses : pulses full and equal in all extremities Extremities/Back: Normal muscle strength and tone., no edema present, kyphosis present, scoliosis present  Psychiatric: normal memory Neurological: oriented to time, person and place, affect appropriate    __    Medications added today by the physician:      IMPRESSIONS:   1. Cough, likely viral URI, recently prescribed Tessalon Perles.   2. Multifactorial dyspnea, improved after restoration of sinus rhythm, but with some recent  recurrence. Moderate COPD as well as restriction followed by Dr. Stacy Gardner.  3. Paroxysmal atrial fibrillation. Failed Tikosyn and now in sinus rhythm on  amiodarone after  repeat electrical cardioversion January 28, 2015. Last seen by Dr. Amado Coe April 18, 2015.  Normal TSH and liver function.  4. Moderate to severe MR with biatrial enlargement and moderate to severe TR on echo February  2015,  markedly improved after restoration of sinus rhythm on last echo July 27, 2014 to  mild degree of regurgitation.  5. 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 Stacy Gardner.  6. Transesophageal echo 2014: Etiology  of MR was anular dilatation.  7. Cardiac catheterization September 08, 2012: Normal coronary arteries with relatively compensated  hemodynamics and only moderate MR.  8. Kyphoscoliosis with some component of restriction in activity due to back  pain and knee pain.  9. Degenerative joint disease of the knees. Anticipating possible surgery for right sided disease  under evaluation.  10. Right sided neck fullness with remote negative CT of the neck and 2008.  11. Normal CT of the  abdomen and pelvis in 2009 with history of celiac disease.  12. Anemia, followed by Dr. Pecola Leisure.  13. Hypothyroidism, on Synthroid.    RECOMMENDATIONS:   1. The patient should be seen by Dr. Stacy Gardner if her cough does not improve. It may  be related  to an upper  respiratory viral syndrome and she may benefit from low dose steroids, or even  Zithromax depending on how she does.  2. She is going to continue her current regimen for atrial fibrillation and follow up with [place] in  six  months. She will need repeat thyroid function and liver function at that time. Pulmonary  function is followed by Dr. Stacy Gardner and last spirometry was May 29, 2015.  3. We will check echo given her symptoms of dyspnea. I have encouraged  her to obtain this  once she has recovered from her upper respiratory infection, possibly in three weeks.    Encarnacion Slates, MD     Tid: [id number]:    cc: Danford Bad MD  Bryon Lions MD     eca  ____________________________  Christianne Dolin  Return Visit 15 MIN 6 months  2D, color flow, doppler 3 weeks  Lipid Panel Today

## 2020-09-24 NOTE — Progress Notes (Signed)
[facility]  [address] [place], [place] [address]     [name], [place]    Date of Visit:  09/29/2016  Date of Birth: [birth year]  Age: 87 yrs.   Medical Record Number: [id number]  __  CURRENT DIAGNOSES     1. Mitral valve insufficiency nonrheumatic,  I34.0  2. 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 Unspecified atrial fibrillation, I48.91  4. Arrhythmia-PVCs symptomatic, I49.3  5. CHF chronic systolic, I50.22  6. Atrial Septal Defect, Q21.1  7. Shortness of breath, R06.02  8.  Long term (current) use of anticoagulants, Z79.01  9. Cardiac arrest, history of, Z86.74  __  ALLERGIES     Erythromycin Base, Rash  Tikosyn, Intolerance-unknown  __  MEDICATIONS     1. Fosamax 70  mg tablet, 1 po qweek  2. Align 4 mg capsule, Take as Directed  3. gabapentin 100 mg capsule, 1 po tid  4. magnesium 250 mg tablet, 1 po qd  5. Tylenol Extra Strength 500 mg tablet, 1 po tid  6. multivitamin capsule, 1 po qd  7.  Synthroid 112 mcg tablet, 1 po qd  8. Vitron-C 65 mg iron-125 mg tablet,delayed release, 2 x per week  9. Citracal + D Slow Release 600 mg calcium-500 unit tablet,ext.release, 2 po qd  10. simvastatin 10 mg tablet, 1 po qhs  11. CoQ-10  100 mg capsule, 250 mg per pt. 1 po qd  12. fluticasone 100 mcg/actuation blister powder for inhalation, Take as Directed  13. Xarelto 20 mg tablet, qd  14. carvedilol 12.5 mg tablet, 1 po bid  15. Lasix 40 mg tablet, 1 po qd  16.  potassium chloride ER 10 mEq tablet,extended release, bid  __  CHIEF COMPLAINT/REASON FOR VISIT  fu VT, St. Jude ICD  __   HISTORY OF PRESENT ILLNESS  It was a pleasure to see Ms. Backs today in electrophysiology followup. I last saw the patient in January 2018. She is an 87 year old female with a history of persistent atrial  fibrillation with failure of amiodarone therapy. She underwent a complex atrial fibrillation ablation in March of 2017 and was put on Tikosyn therapy during the window period. She had progressive decline of her LV  function with functional mitral regurgitation  and suffered an out of hospital cardiac arrest with full neurologic recovery. While torsades de pointes Tikosyn therapy was considered as an etiology for arrest in the setting of progressive LV dysfunction with an ejection fraction of 35%, a secondary  prevention ICD was recommended. The device was implanted in December 2017. She is now here for followup.     The patient has had very frequent office visits with progressive difficulty with acute systolic heart failure and fluid retention requiring  carefully titrated diuretics. She denies any palpitations, presyncope, frank syncope, shocks from her device, fevers, chills, or night sweats. All other review of systems negative.     Limited device interrogation was performed today. No atrial  fibrillation or other ventricular arrhythmias since last checked in January 2018. She is in ventricular bigeminy on today's visit.   __   PAST HISTORY     Past Medical Illnesses: celiac disease, Chronic back pain, Intermittent steroid spinal  injections, COPD;  Past Cardiac Illnesses: Chest pain 2007, arrythmia 2007, PFO, Atrial fibrillation-persistent,  Mitral regurgitation; Infectious Diseases: Staph infection at age 77 -4, Chicken Pox; Surgical Procedures : Tonsilectomy,Apandectomy , Knee surgery 2002, total R knee replacement; Trauma History: whip lash 1980;  Cardiology Procedures-Invasive: Echo  08, Stress test 08, Cardiac Cath March 2014, Cardioversion May 2015, Cardioversion July 2015, Cardioversion for Afib August 2016, Cardiac Cath (left and right) October  2017, St Jude dual chamber ICD 05/25/16; Cardiology Procedures-Noninvasive: Echocardiogram June 2010, CT scan 01/27/07, Treadmill Stress Test, MPI (Nuclear)  Study January 2013, Echocardiogram January 2013, TEE February 2013, Echocardiogram December 2013, TEE January 2014, Echocardiogram February 2015, Holter Monitor July 2015, Echocardiogram November 2017;  Cardiac Cath  Results: no significant obstructive CAD, mild pulm HTN, R to L shunt with Qp:Qs 1.6, LVEDP ; Left Ventricular Ejection Fraction : LVEF of 25% documented via echocardiogram on 05/19/2016  PHYSICAL EXAMINATION    Vital Signs :  Blood Pressure:  112/70 Sitting, Left arm, regular cuff  118/72 Sitting, Right arm, regular cuff     Weight: 137.00 lbs.  Height: 64"  BMI:  23   Pulse: 86/min.       Constitutional:  Well developed, well nourished, in no acute distress Skin: warm and dry to touch Head : normocephalic, normal female hair pattern Eyes: conjunctivae and lids unremarkable, EOMS intact  ENT: No pallor or cyanosis Neck:  JVD present Chest: Normal symmetry, clear to auscultation bilaterally, normal respiratory effort, ICD site left infraclavicular area  Cardiac: Regular rhythm, S1 normal, S2 normal, No S3 or S4, 2/6 systolic murmur LSB, with ectopic beats Abdomen : bowel sounds normoactive, no bruits Peripheral Pulses: pulses full and equal in all extremities  Extremities/Back: 1+ bilateral ankle edema Psychiatric:  normal memory Neurological: oriented to time, person and place, affect appropriate   __     Medications added today by the physician:      IMPRESSIONS:  1. Recurrent persistent atrial fibrillation with failure of amiodarone therapy with  extensive left   and right atrial ablation and pulmonary vein isolation with focal impulse rotor   modulation-guided bitemporal dispersion ablation in September of 2017.   2. Out of hospital cardiac arrest in December of 2017 status post secondary  prevention   [employer] implantable cardioverter defibrillator.  3. Progressive decline in left ventricular function.  4. Scoliosis.   5. 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 Stacy Gardner.  6.  Acute on chronic systolic heart failure.    ASSESSMENT AND PLAN:  It was a pleasure to see the patient in electrophysiology followup. She has not had any further atrial fibrillation on her  device since implantation off antiarrhythmic drug therapy.  Her device is functioning well without any alerts on today's visit and has healed well.    Her greatest difficulty right now is ongoing chronic systolic heart failure. I do note that she is in ventricular bigeminy with an outflow tract PVC at today's  visit. She has never had much in the way of PVCs until over the last few months, and her myopathy predated its development. I do not think this is PVC-induced myopathy although certainly a high-grade ventricular ectopy could be precipitating worsening  systolic heart failure. Additionally, the PVCs may be a consequence of the congestive heart failure thus what I would advocate doing is ongoing attention to fluid management. I have asked to restart the metolazone for the next three days and will follow  up on her recent blood work. I will also move up her appointment with. Dr. Talmadge Chad for one week's time, and I would expect close outpatient followup over the next few weeks to make sure that she is carefully diuresed. Again she did have a medium-size  ASD on her  November 2017 echo likely a consequence of her transeptal puncture and certainly if this is contributing to worsening heart failure, I will arrange for an echocardiogram in order to better evaluate RV performance. Any consideration of PVC ablation  is going to be deferred until full optimization as described above.    Thurnell Lose, MD    ASF/tutjm    cc: Danford Bad MD    AP  ____________________________  TODAYS ORDERS  RETURN VISIT EP WITH NP/PA 1 month   12 Lead ECG Today

## 2020-09-24 NOTE — Progress Notes (Signed)
[facility] OFFICE  [address] [place], [place] [address]     [name], [place]    Date of Visit:  05/03/2017  Date of Birth: May 02, 1932  Age: 87 yrs.   Medical Record Number: [id number]  Referring Physician: Casimer Bilis MD, [place] J  __   CURRENT DIAGNOSES     1. Mitral valve insufficiency nonrheumatic, I34.0  2. 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 Atrial Fibrillation, Paroxysmal, I48.0  4. Arrhythmia-PVCs symptomatic, I49.3   5. CHF chronic systolic, I50.22  6. Atrial Septal Defect, Q21.1  7. Shortness of breath, R06.02  8. Status post atrial septal defect repair, Z98.890  9. Poisoning By Loop Diuretics, Accidental, Subs, T50.1X1D  10. Device check automatic  implantable cardiac defibrillator, Z45.02  11. Long term (current) use of anticoagulants, Z79.01  12. Cardiac arrest, history of, Z86.74  __  ALLERGIES     Erythromycin Base, Rash  Tikosyn, Intolerance-unknown  __  MEDICATIONS     1. Align 4 mg  capsule, Take as Directed  2. carvedilol 12.5 mg tablet, 1 po qd  3. Citracal + D Slow Release 600 mg calcium-500 unit tablet,ext.release, 2 po qd  4. coenzyme Q10 100 mg capsule, 1 po qd  5. fluticasone 100 mcg/actuation blister powder  for inhalation, Take as Directed  6. Fosamax 70 mg tablet, 1 po qweek  7. gabapentin 100 mg capsule, 1 po tid  8. losartan 25 mg tablet, 1/2 tab po qd, hold sbp less than 100  9. magnesium 200 mg tablet, 1 po qd  10. multivitamin capsule,  1 po qd  11. simvastatin 10 mg tablet, 1 po qhs  12. spironolactone 25 mg tablet, 1/2 tab po qd  13. Synthroid 112 mcg tablet, 1 po qd  14. torsemide 20 mg tablet, 1 po qd sun,mon,wed,fri,sat and 2 po qd tues,thurs  15. Tylenol Extra  Strength 500 mg tablet, 1 po tid  16. Vitron-C 65 mg iron-125 mg tablet,delayed release, 2 x per week  17. Xarelto 15 mg tablet, 1 po qd  __  CHIEF COMPLAINT/REASON FOR VISIT   Followup of CHF chronic systolic and Followup of Shortness of breath  __  HISTORY OF PRESENT ILLNESS  [name] comes in for clinical  followup.  She brings in a record of her weights, which have been generally stable. She continues to have chronic dyspnea on exertion, which is multifactorial. She reports that her husband is hoping that she will get better so that she can go on walks with him.  We reviewed that she has not done that in many years. She denies palpitations, lightheadedness or presyncope. Overall, she seems to be relatively compensated, but clearly declining gradually over time.  ___   PAST HISTORY     Past Medical Illnesses: celiac disease, Chronic back pain, Intermittent steroid spinal  injections, COPD, hypothyroid;  Past Cardiac Illnesses: Chest pain 2007, arrythmia 2007, PFO, Atrial  fibrillation-persistent, Mitral regurgitation, Cardiomyopathy, HFwREF, VT; Infectious Diseases: Staph infection at age 66 -36, Chicken Pox;  Surgical Procedures: Tonsilectomy,Apandectomy , Knee surgery 2002, total R knee replacement, ASD Repair; Trauma History : whip lash 1980; Cardiology Procedures-Invasive: Echo 08, Stress test 08, Cardiac Cath March 2014, Cardioversion May 2015, Cardioversion July 2015, Cardioversion  for Afib August 2016, Cardiac Cath (left and right) October 2017, St Jude dual chamber ICD 05/25/16, Cardiac Cath (right) May 2018, ASD closure 11/11/16, ASD closure 11/11/16; Cardiology Procedures-Noninvasive : CT scan 01/27/07, Treadmill Stress Test, MPI (Nuclear) Study January 2013, TEE 07/2011, 06/2012, Holter Monitor  July 2015, Echocardiogram 11/2008, 06/2011, 05/2012, 07/2013, 04/2016, 10/2016, 03/2017; Cardiac  Cath Results: no significant obstructive CAD, mild pulm HTN, R to L shunt with Qp:Qs 1.6, LVEDP ; Left Ventricular Ejection Fraction : LVEF of 25% documented via echocardiogram on 04/07/2017  SOCIAL HISTORY     Alcohol Use: Does not use alcohol; Smoking: Does not smoke; Never smoker ([id number]);  Diet: Regular diet and Caffeine use-1-2 per day; Lifestyle: Married, 2 sons - one in [place] and second  in [place]; Exercise: Exercises  occasionally;   __  PHYSICAL EXAMINATION     Vital Signs:  Blood Pressure:  118/64 Sitting, Left arm, regular cuff  122/68 Sitting, Right arm, regular cuff     Weight: 135.20 lbs.  Height: 64"  BMI:  23   Pulse: 70/min.       Constitutional:  thin frail chronically ill appearing,pale, using cane Skin: warm and dry to touch Head : normocephalic Eyes: pale conjunctivae ENT: No  cyanosis Neck: JVD present, carotid pulses are full and equal bilaterally, no bruits Chest : clear to auscultation bilaterally, no use of accessory muscles, normal respiratory effort Cardiac: Irregularly regular rhythm with variable 1st heart  sound, normal 2nd heart sound, low pitched holosystolic murmur grade 2/6 left sternal border Abdomen: bowel sounds normoactive  Peripheral Pulses: pulses full and equal in all extremities Extremities/Back: 1+ bilateral pretibial  edema Psychiatric: mild forgetfulness Neurological : oriented to time, person and place, affect appropriate   __    Medications added today by the physician:  carvedilol 12.5 mg tablet, 1  po qd, 90    IMPRESSIONS:   1. Chronic systolic heart failure with reasonable compensation given severe cardiomyopathy and   severe mitral regurgitation. The patient has had stable weights over the last several readings,   although  it is slightly up today in the office.  2. Atrial septal defect, status post closure Nov 11, 2016. She completed three months of Plavix.  3. Persistent atrial fibrillation with controlled ventricular rate. Prior failure of amiodarone with    subsequent extensive left and right atrial ablation with focal impulse rotor modulation-guided   bitemporal dispersion ablation September 2017.  4. CHADS-VASc score of 4 on 15 mg of Xarelto due to a decline in renal function.  5. Recent hospitalization  related to ventricular tachycardia on pacemaker check associated with   hypokalemia and inadvertent Lasix overdose.  6. VF arrest in the setting of new cardiomyopathy while on  Tikosyn, status post ICD implant   December 2017.  7. Cardiac  catheterization showing no significant coronary disease, Nov 11, 2016.  8. Kyphoscoliosis with moderate restriction. Severe DLCO abnormality followed by Dr. Stacy Gardner.  9. Hypothyroidism.  10. Intolerance of ACE inhibitor due to hypotension.     RECOMMENDATIONS:   1. The patient is going to continue on her current schedule. She seems to be reasonably   compensated, but needs frequent office visits to maintain this.  2. I have previously encouraged and again reiterated today that her  son should be more involved   in her care. Unfortunately, they live out of state, and the patient and her husband have made   no plans in terms of advanced directives or care once they are unable to take care of themselves.   I did voice a  concern that her congestive heart failure is progressing and although under control,   could very easily become decompensated.  3. Return clinical followup in our heart failure clinic in one month and  with me in two months, but   certainly  earlier if she develops any trouble.  4. We re-discussed today that she should not stop any of her medications and that pharmacy   can obtain refills from [place] at any time, as long as she lets them know if she is getting low.   We discussed  this at least a couple of times, which makes me concerned that her short-term   memory is starting to decline.    Encarnacion Slates, MD     Tid: [id number]:WU    cc:  [place] J ZIK MD  Danford Bad MD    el   ____________________________  TODAYS ORDERS   Heart Failure Clinic Visit 1 month  Return Visit 15 MIN 2 months

## 2020-09-24 NOTE — Progress Notes (Signed)
[facility] OFFICE  [address] [place], [place] [address]     [name], [place]    Date of Visit:  11/02/2016  Date of Birth: Feb 14, 1932  Age: 87 yrs.   Medical Record Number: [id number]  __  CURRENT DIAGNOSES     1. Arrhythmia-PVCs symptomatic, I49.3   2. CHF chronic systolic, I50.22  3. Atrial Septal Defect, Q21.1  4. Mitral valve insufficiency nonrheumatic, I34.0  5. Cardiomyopathy dilated, I42.0  6. Unspecified atrial fibrillation, I48.91  7. Shortness of breath, R06.02  8.  Device check automatic implantable cardiac defibrillator, Z45.02  9. Long term (current) use of anticoagulants, Z79.01  10. Cardiac arrest, history of, Z86.74  __  ALLERGIES     Erythromycin Base, Rash  Tikosyn, Intolerance-unknown  __  MEDICATIONS     1. Align 4 mg  capsule, Take as Directed  2. carvedilol 12.5 mg tablet, 1 po bid  3. Citracal + D Slow Release 600 mg calcium-500 unit tablet,ext.release, 2 po qd  4. CoQ-10 100 mg capsule, 250 mg per pt. 1 po qd  5. fluticasone 100 mcg/actuation blister  powder for inhalation, Take as Directed  6. Fosamax 70 mg tablet, 1 po qweek  7. gabapentin 100 mg capsule, 1 po tid  8. Lasix 40 mg tablet, 1 po qd  9. magnesium 250 mg tablet, 1 po qd  10. metolazone 2.5 mg tablet, 1 po 30 minutes  prior to lasix 3 times a week  11. multivitamin capsule, 1 po qd  12. potassium chloride ER 10 mEq tablet,extended release, bid except when taking metolazone tid  13. simvastatin 10 mg tablet, 1 po qhs  14. Synthroid 112 mcg tablet, 1  po qd  15. Tylenol Extra Strength 500 mg tablet, 1 po tid  16. Vitron-C 65 mg iron-125 mg tablet,delayed release, 2 x per week  17. Xarelto 20 mg tablet, 1 po qd  __  CHIEF COMPLAINT/REASON  FOR VISIT  Followup of CHF chronic systolic, Followup of Mitral valve insufficiency nonrheumatic and Followup of Shortness of breath  __  HISTORY  OF PRESENT ILLNESS  [name] comes in for clinical followup and, unfortunately, is back in atrial fibrillation as noted on her EP  visit May 8. No changes were made as she has an upcoming procedure with  Dr. Garlan Fair to close her atrial septal defect. She is struggling with heart failure and had an increase in diuretic frequency at the last visit. She is also having more trouble keeping track of the instructions, although every instruction has been written  out. I have encouraged her to involve her sons in her care. At present, there is no plan for them to attend the procedure next week and I have encouraged her to involve them a little bit more closely. She and her husband will probably benefit from a little  bit more of their participation. I have offered to speak with them in the past, although they have declined that to this point. The patient understands that correction of her atrial septal defect may now not be the entire solution to her problem as she  has recurrent atrial fibrillation as well.  __  PAST HISTORY     Past Medical Illnesses : celiac disease, Chronic back pain, Intermittent steroid spinal injections, COPD;  Past Cardiac Illnesses : Chest pain 2007, arrythmia 2007, PFO, Atrial fibrillation-persistent, Mitral regurgitation; Infectious Diseases: Staph infection at age 15 -70, Chicken  Pox; Surgical Procedures: Tonsilectomy,Apandectomy , Knee surgery 2002, total R knee  replacement; Trauma  History: whip lash 1980; Cardiology Procedures-Invasive: Echo 08, Stress test 08, Cardiac Cath March 2014,  Cardioversion May 2015, Cardioversion July 2015, Cardioversion for Afib August 2016, Cardiac Cath (left and right) October 2017, [employer] Jude dual chamber ICD 05/25/16; Cardiology Procedures-Noninvasive : Echocardiogram June 2010, CT scan 01/27/07, Treadmill Stress Test, MPI (Nuclear) Study January 2013, Echocardiogram January 2013, TEE February 2013, Echocardiogram December 2013, TEE January 2014, Echocardiogram February 2015, Holter Monitor July 2015,  Echocardiogram November 2017; Cardiac Cath Results: no significant obstructive CAD, mild pulm  HTN, R to L shunt with Qp:Qs 1.6, LVEDP ;  Left Ventricular Ejection Fraction: LVEF of 25% documented via echocardiogram on 05/19/2016  SOCIAL HISTORY     Alcohol Use: Does not use alcohol; Smoking: Does not smoke; Never smoker ([id number]);  Diet: Regular diet and Caffeine use-1-2 per day; Lifestyle: Married;  Exercise: Exercises occasionally;   __  PHYSICAL EXAMINATION     Vital Signs:  Blood Pressure:  120/70 Sitting, Left arm, regular cuff  120/70 Sitting, Right arm,  regular cuff    Weight: 133.40 lbs.  Height: 64"   BMI: 23   Pulse: 88/min.        Constitutional: Well developed, well nourished, in no acute distress Skin: warm and dry to touch  Head: normocephalic, normal female hair pattern Eyes:  conjunctivae and lids unremarkable, EOMS intact ENT: No pallor or cyanosis  Neck: JVD noted at 90 degrees, carotid pulses are full and equal bilaterally, no bruits Chest : Normal symmetry, clear to auscultation bilaterally, normal respiratory effort, ICD site left infraclavicular area Cardiac: Irregularly irregular rhythm  with variable 1st heart sound, normal 2nd heart sound, low pitched holosystolic murmur grade 2/6 left sternal border Abdomen: bowel sounds normoactive,  no bruits Peripheral Pulses: pulses full and equal in all extremities  Extremities/Back: 1+ bilateral ankle edema Psychiatric:  normal memory Neurological: oriented to time, person and place, affect appropriate   __     Medications added today by the physician:      IMPRESSIONS:   1. Multifactorial acute-on-chronic  systolic congestive heart failure, now dependent on use   of metolazone three times weekly, in addition to higher dose furosemide daily. The   patient is developing problems with electrolyte depletion and we have addressed that   on this visit.   2. Nonischemic cardiomyopathy with ejection fraction 25% November 2017, up to 45%   October 06, 2016. Reasonably tolerating carvedilol.  3. Transseptal ASD with left to right shunt and Qp:Qs  1.6 by cardiac catheterization.   Dilated right-sided  chambers with moderate-to-severe tricuspid regurgitation.  4. Recurrent atrial fibrillation noted on last visit Oct 27, 2016. Status post failure of   amiodarone and extensive left and right atrial focus impulsive rotor modulation   ablation  with pulmonary vein isolation September 2017.  5. Chronic Xarelto for anticoagulation.  6. Out of hospital cardiac arrest December 2017, on Tikosyn and also with progressive   decline in left ventricular function, treated with implantable  cardioverter defibrillator   implant December 2017.  7. Kyphoscoliosis with moderate restriction.  8. Hypothyroidism.  9. Severe DLCO abnormality followed by Dr. Stacy Gardner.   10. Hypothyroidism, on Synthroid.     RECOMMENDATIONS:   1. As above, I have encouraged the patient to involve her sons more closely in her care.   Her husband also apparently requires some assistance as his memory appears to   be  declining.  2. The patient was advised to take 30 mEq  of potassium today and tomorrow, followed   by 30 mEq of potassium on the days that she takes metolazone and 20 mEq of   potassium on other days. The instructions were written out and repeated  to her   with making sure that she understands. She acknowledges that she forgot to   increase her potassium supplements the last time we spoke about it.  3. She was given a lab slip to check repeat electrolytes and magnesium level in one    to two days to ensure that she is not declining further.  4. If her heart failure becomes refractory to outpatient therapy, we may need to   consider elective hospitalization prior to her upcoming procedure.  5. There was also some confusion  about when she should hold her Xarelto. We   will check with Dr. Garlan Fair on his preference for that as the instructions currently   given to her recommend holding the day prior to the procedure and the day of   the procedure.  6. Return  clinical followup with me once the  procedure is completed in approximately   two to three weeks.    Encarnacion Slates, MD     Tid: [id number]    cc: Bryon Lions MD  Danford Bad MD      AP  ____________________________  TODAYS ORDERS  Basic Metabolic Panel 2 days  Magnesium, RBC 2 days  Return Visit 15 MIN 1 month

## 2020-09-24 NOTE — Progress Notes (Signed)
[facility] OFFICE  [address] [place], [place] [address]     [name], [place]    Date of Visit:  04/22/2016  Date of Birth: 05-16-1932  Age: 87 yrs.   Medical Record Number: [id number]  __  CURRENT DIAGNOSES     1. Mitral valve insufficiency nonrheumatic,  I34.0  2. 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 CHF acute on chronic diastolic, I50.33  4. Atrial Septal Defect, Q21.1  5. Long term (current) use of anticoagulants, Z79.01  6. Shortness of breath, R06.02  7. Unspecified atrial fibrillation,  I48.91  __  ALLERGIES    Erythromycin Base, Rash   __  MEDICATIONS     1. Fosamax 70 mg tablet, 1 po qweek  2. Align 4 mg capsule, Take as Directed  3. gabapentin 100 mg capsule, 1 po tid   4. magnesium 250 mg tablet, 1 po qd  5. Tylenol Extra Strength 500 mg tablet, 1 po tid  6. multivitamin capsule, 1 po qd  7. lisinopril 5 mg tablet, 1 po qd  8. Synthroid 112 mcg tablet, 1 po qd  9. Vitron-C 65 mg iron-125 mg tablet,delayed  release, 2 x per week  10. Citracal + D Slow Release 600 mg calcium-500 unit tablet,ext.release, 2 po qd  11. simvastatin 10 mg tablet, 1 po qhs  12. CoQ-10 100 mg capsule, 250 mg per pt. 1 po qd  13. warfarin 5 mg tablet, 1 to 1.5 po  qd or as directed by [facility]  14. fluticasone 100 mcg/actuation blister powder for inhalation, Take as Directed  15. Lasix 20 mg tablet, 1 po qd  16. Tikosyn 250 mcg capsule, 1 PO BID  __   CHIEF COMPLAINT/REASON FOR VISIT  Followup of Atrial Septal Defect, Followup of Cardiomyopathy dilated and Followup of CHF acute on chronic diastolic  __   HISTORY OF PRESENT ILLNESS  [name] returns for clinical followup after she developed congestive heart failure following her extensive ablation for persistent and recurrent atrial fibrillation  in September. She was seen March 13, 2016 after Lasix was reinitiated. She was gradually improving and an echocardiogram was ordered. This was performed April 07, 2016 and showed new LV systolic dysfunction with  an EF of 35%, as well as a left to  right hemodynamically significant shunt across a patent foramen ovale. The patient had severe pulmonary hypertension as well. Due to this issue, she underwent cardiac catheterization, April 16, 2016. Fortunately, this revealed compensated hemodynamics.  The patient continues to have no significant coronary artery disease. She had a left-to-right shunt after transseptal puncture with her ablation, and the hope is that this will resolve over time. Her to Qp:Qs ratio was 1.6, but she had compensated hemodynamics  with an LVEDP of 12 mmHg. The patient does feel somewhat better in terms of her breathing currently, although she is still having palpitations. On ECG today, she has frequent PVCs. We are going to try some potassium supplementation.  __   PAST HISTORY     Past Medical Illnesses: celiac disease, Chronic back pain, Intermittent steroid spinal  injections, COPD;  Past Cardiac Illnesses: Chest pain 2007, arrythmia 2007, PFO, Atrial fibrillation-persistent,  Mitral regurgitation; Infectious Diseases: Staph infection at age 76 -65, Chicken Pox; Surgical Procedures : Tonsilectomy,Apandectomy , Knee surgery 2002, total R knee replacement; Trauma History: whip lash 1980;  Cardiology Procedures-Invasive: Echo 08, Stress test 08, Cardiac Cath March 2014, Cardioversion May 2015, Cardioversion July 2015, Cardioversion for Afib August 2016, Cardiac Cath (left  and right) October  2017; Cardiology Procedures-Noninvasive: Echocardiogram June 2010, CT scan 01/27/07, Treadmill Stress Test, MPI (Nuclear) Study January 2013, Echocardiogram  January 2013, TEE February 2013, Echocardiogram December 2013, TEE January 2014, Echocardiogram February 2015, Holter Monitor July 2015; Cardiac Cath Results : no significant obstructive CAD, mild pulm HTN, R to L shunt with Qp:Qs 1.6, LVEDP ; Left Ventricular Ejection Fraction: LVEF of 50% documented  via echocardiogram on 03/06/2016  SOCIAL  HISTORY    Alcohol Use : Does not use alcohol; Smoking: Does not smoke; Never smoker ([id number]); Diet : Regular diet and Caffeine use-1-2 per day; Lifestyle: Married; Exercise : Exercises occasionally;   __  PHYSICAL EXAMINATION    Vital Signs :  Blood Pressure:  148/70 Sitting, Left arm, regular cuff  148/70 Sitting, Right arm, regular cuff     Weight: 137.00 lbs.  Height: 62"  BMI:  25   Pulse: 77/min. Apical Regular        Constitutional: Well developed, well nourished, in no acute distress Skin: warm and dry to touch  Head: normocephalic, normal female hair pattern Eyes:  conjunctivae and lids unremarkable, EOMS intact ENT: No pallor or cyanosis  Neck: carotid pulses are full and equal bilaterally, no JVD, no bruits Chest : Normal symmetry, clear to auscultation bilaterally, no use of accessory muscles, normal respiratory effort Cardiac: Regular rhythm, S1 normal, S2 normal,  frequent ectopic beats Abdomen: bowel sounds normoactive, no bruits Peripheral Pulses : pulses full and equal in all extremities Extremities/Back: trace bilateral ankle edema  Psychiatric: normal memory Neurological:  oriented to time, person and place, affect appropriate   __    Medications added today by the physician:       IMPRESSIONS:   1. Chronic dyspnea, multifactorial due to elements of both diastolic heart failure and chronic   obstructive pulmonary disease with exacerbation of heart failure symptomatology after    ablation, February 27, 2016, possibly related to atrial stunning and new LV systolic   dysfunction.  2. Cardiac catheterization, April 16, 2016, showing compensated hemodynamics   despite hemodynamically significant left-to-right shunt after  transseptal puncture. No   significant obstructive coronary artery disease.  3. Persistent atrial fibrillation, status post cardioversion, March 05, 2016. Pulmonary   vein isolation, biatrial FIRM-guided bitemporal dispersion, right isthmus  ablation,   February 26, 2016.  Failed Tikosyn and amiodarone in the past.  4. Chronic Coumadin anticoagulation due to CHADS-VASc score of three based on age   and female gender.  5. Moderate to severe mitral valve regurgitation with biatrial  enlargement and severe   tricuspid regurgitation. Mean PAP of 28 mmHg after diuresis on left and right heart   catheterization, April 16, 2016.  6. 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 Stacy Gardner.   7. History of celiac disease.  8. Chronic anemia.  9. Kyphoscoliosis with component of restriction.  10. Right-sided neck fullness with remote negative CT of the neck in 2008.  11. Hypothyroidism, on Synthroid.  12. Situational stress  as the primary caregiver for her husband who is developing progressive   Alzheimer dementia.    RECOMMENDATIONS:   1. The patient is going  to continue on daily diuretic therapy for now and we will supplement   potassium for a limited period of time.  2. We will repeat electrolytes and magnesium in approximately six weeks with clinical followup.  3. We will think about a limited  repeat echo to reevaluate her shunt and EF once she  has had   more time to recover from her extensive ablation. She may be a good candidate for initiation   of carvedilol as well.  4. A followup with Dr. Amado Coe already scheduled for early next  year.     Encarnacion Slates, MD     Tid: [id number]    cc: Danford Bad MD  Bryon Lions MD    Enclosure: Cardiac cath/echo     AP  ____________________________  TODAYS ORDERS  Basic Metabolic Panel 6 weeks  Magnesium, RBC 6 weeks  12 Lead ECG Today  Return Visit  15 MIN 6 weeks

## 2020-09-24 NOTE — Progress Notes (Signed)
[facility] OFFICE  [address] [place], [place] [address]     HEART FAILURE CLINIC VISIT     [place], [place]    Date of Visit:  03/29/2017  Date of Birth: 09/10/1931  Age: 87 yrs.   Medical Record Number: [id number]  __  CURRENT DIAGNOSES     1. Mitral valve insufficiency nonrheumatic,  I34.0  2. 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 Atrial Fibrillation, Paroxysmal, I48.0  4. Arrhythmia-PVCs symptomatic, I49.3  5. CHF chronic systolic, I50.22  6. Atrial Septal Defect, Q21.1  7. Shortness of breath, R06.02  8.  Device check automatic implantable cardiac defibrillator, Z45.02  9. Status post atrial septal defect repair, Z98.890  10. Long term (current) use of anticoagulants, Z79.01  11. Cardiac arrest, history of, Z86.74  __   ALLERGIES    Erythromycin Base, Rash  Tikosyn, Intolerance-unknown  __   MEDICATIONS     1. Align 4 mg capsule, Take as Directed  2. carvedilol 12.5 mg tablet, 1 po bid  3. Citracal + D Slow Release 600 mg calcium-500 unit tablet,ext.release, 2 po qd  4. fluticasone  100 mcg/actuation blister powder for inhalation, Take as Directed  5. Fosamax 70 mg tablet, 1 po qweek  6. gabapentin 100 mg capsule, 1 po tid  7. Lasix 40 mg tablet, 1.5 po qam and .5 po qpm (2:00pm)  8. magnesium 250 mg tablet, 1 po  qd  9. multivitamin capsule, 1 po qd  10. potassium chloride ER 10 mEq tablet,extended release, 1 po bid  11. simvastatin 10 mg tablet, 1 po qhs  12. Synthroid 112 mcg tablet, 1 po qd  13. Tylenol Extra Strength 500 mg tablet, 1 po  tid  14. Vitron-C 65 mg iron-125 mg tablet,delayed release, 2 x per week  15. Xarelto 20 mg tablet, 1 po qd    __  HISTORY OF PRESENT ILLNESS   Ms. Worster was seen in followup in the heart failure clinic today. She was seen on March 04, 2017, by Danford Bad, NP, at which time she was found to be volume overloaded. She has not been able to tolerate ACE inhibitor in the past due to hypotension.  She has also remained off an ARB or Entresto, as well as  spironolactone due to relative hypotension, though we could consider in the future. At the last visit due to volume overload, Lasix was increased to 60 mg daily. It was recommended she do that for  five days, then go back to 40. However, she did not make that change and has remained on 60. Despite the increase in Lasix, she has actually gained weight from 131 pounds to 135 pounds. She is obviously volume overloaded on exam with JVD, bibasilar rales  and edema. Her shortness of breath is quite significant. With just walking a few feet, she becomes dyspneic. She denies any orthopnea or PND. She has been compliant with her medications with missing the dose of Lasix less than once a week. She is also  very compliant with a low-sodium diet. She weighs herself daily and checks her blood pressure daily. On average, her blood pressure is between 100-110 systolic, which may allow for up titration of meds in the future once she is diuresed. She had labs  in August with her PCP, though they are not available at today's visit, and will be requested. She is pale on exam, though she adamantly denies any obvious bleeding. She has had a history of GI bleed in the past.  She has been maintained on Xarelto for  atrial fibrillation.   __  PAST HISTORY     Past Medical Illnesses : celiac disease, Chronic back pain, Intermittent steroid spinal injections, COPD;  Past Cardiac Illnesses : Chest pain 2007, arrythmia 2007, PFO, Atrial fibrillation-persistent, Mitral regurgitation, Cardiomyopathy, HFwREF; Infectious Diseases: Staph infection  at age 21 -66, Chicken Pox; Surgical Procedures: Tonsilectomy,Apandectomy , Knee surgery 2002, total R knee replacement, ASD Repair;  Trauma History: whip lash 1980; Cardiology Procedures-Invasive: Echo 08, Stress test 08, Cardiac Cath March  2014, Cardioversion May 2015, Cardioversion July 2015, Cardioversion for Afib August 2016, Cardiac Cath (left and right) October 2017, St Jude dual chamber ICD  05/25/16, Cardiac Cath (right) May 2018, ASD closure 11/11/16, ASD closure 11/11/16;  Cardiology Procedures-Noninvasive: Echocardiogram June 2010, CT scan 01/27/07, Treadmill Stress Test, MPI (Nuclear) Study January 2013, Echocardiogram January 2013, TEE February 2013, Echocardiogram December  2013, TEE January 2014, Echocardiogram February 2015, Holter Monitor July 2015, Echocardiogram November 2017, Echocardiogram May 2018; Cardiac Cath Results : no significant obstructive CAD, mild pulm HTN, R to L shunt with Qp:Qs 1.6, LVEDP ; Left Ventricular Ejection Fraction: LVEF of 20% documented  via echocardiogram on 11/12/2016  HEART FAILURE MANAGEMENT QUALITY CHECKLIST    Weight Today : 135.00000 lbs.  Goal Dry Weight: 130 lbs.     NYHA Class: III  HF Hospitalization Date:  June 2018    HF Medications:   Beta Blocker: Yes  ACEI / ARB: Not Tolerated  Aldosterone Antagonist: No  Hydralazine: Not Indicated  Nitroglycerine: Not Indicated     Device Therapy:  ICD Candidate?: Yes  Referred to EP?: Yes  ICD Implanted?: Yes  CRT Candidate?: Yes  CRT Device in Place?: Yes     LVEF: 20%  LV Performance Assessed:  11/12/2016  Type of Study: echocardiogram     QRS 120 msec?: Yes  Sleep Apnea Screening:  Needs Screening  Heart Failure Education: Low Sodium Diet, Heart Failure Medications, How to Manage  Heart Failure, Blood Pressure Log and Daily Weight Log  __  PHYSICAL EXAMINATION    Vital Signs :  Blood Pressure:  110/68 Sitting, Left arm, regular cuff  112/70 Sitting, Right arm, regular cuff     Weight: 135.00 lbs.  Height: 64"  BMI:  23   Pulse: 84/min.       Constitutional:  thin frail chronically ill appearing,pale, using cane Skin: warm and dry to touch Head : normocephalic Neck: JVD to jaw line  Chest: no use of accessory muscles, normal respiratory effort, coarse bibasilar rales Cardiac: Irregularly  regular rhythm with variable 1st heart sound, normal 2nd heart sound, low pitched holosystolic murmur grade 2/6 left  sternal border Abdomen: abdomen soft  Peripheral Pulses: 2+ radial pulses b/l Extremities/Back : 2+ bilateral pretibial edema Psychiatric: normal memory  Neurological: oriented to time, person and place, affect appropriate   __    Medications added  today by the physician:  Lasix 40 mg tablet, 1.5 po qam and .5 po qpm (2:00pm), 180  Lasix 40 mg tablet, has been taking 60mg qd since 03/04/17, 90       IMPRESSIONS:   1. Heart failure with reduced ejection fraction, New York Heart Association Class III, acutely  volume overloaded today. Dry weight goal approximately 130 pounds. Weight today 135.  No  change in weight with recent up titration of Lasix. Maintained only on carvedilol at this time  due to relative hypotension. Previous significant hypotension on ACE inhibitor, can reconsider  low dose Aldactone and Entresto in the future  if blood pressure allows.  2. Atrial septal defect, status post closure Nov 11, 2016, completed three months of Plavix.  Assures me she is no longer on Plavix or aspirin therapy and is only on Xarelto with no evidence  of bleeding.  3.  Hospitalization for GI bleed in June 2018. Source was not identified. Require blood transfusion.  Transiently off Plavix at that time and Xarelto, though back on Xarelto now and has completed  Plavix post atrial septal defect.  4. Paroxysmal  atrial fibrillation, status post failure of amiodarone, subsequent extensive left and  right atrial focus impulses rotator ablation with pulmonary vein isolation September 2017.  5. CHADS-VASc score of 4 on Xarelto. Appears anemic today, though  no obvious source of  bleeding. We will recheck H&H.  6. Out of hospital cardiac arrest December 2017.  7. Nonischemic cardiomyopathy, EF 20% to 25% by last echo in May 2018, status post ICD  December 2017.  8. COPD contributing to  dyspnea as well; however, significant volume overload today.  9. Acute scoliosis with moderate restriction, also with severe DLCO abnormality  managed by  Dr. Stacy Gardner.     RECOMMENDATIONS:    1. Increase Lasix from 60 mg daily to 80 mg times two in the morning. After that, she is to take  60 mg in the morning and 20 mg in the afternoon around 2:00.  2. Continue low-sodium diet, less than 1500 mg daily.  3. Continue monitoring  blood pressure and weight with goal dry weight of 130.  4. We will check a BNP and a BMP, as well as an H&H in 10 days. Continue to monitor for any  bleeding. Should she develop any, she is to notify us immediately as she is maintained on   Xarelto with a history of GI bleeding and does appear mildly anemic on exam today.  5. She may be a good candidate for CardioMEMS. We briefly discussed and I provided her with  a brochure to read.  6. Followup with the nurse practitioner in  two weeks in the heart failure clinic to reevaluate volume  status. If blood pressure allows, we could consider low-dose Aldactone or low-dose Entresto,  though will need close monitoring of renal function and electrolytes and blood pressure.  In  regards to the CardioMEMS candidacy, she does have Medicare, has had hospitalization within  a year and is New York Heart Association Class III. This is a device that would measure on an  outpatient basis her pulmonary artery diastolic  pressures remotely on a daily basis and would  allow for closer monitoring of her volume status. It has been shown to reduce hospitalization  by 30% to 40%.    Eve Veryl Winemiller, ANP-C     Tid: [id number]     cc: Molli Posey REESE MD    MG   ____________________________  TODAYS ORDERS  Basic Metabolic Panel 10 days  Brain Natriuretic Peptide  10 days  HH - Hemoglobin & Hematocrit 10 days  Heart Failure Clinic Visit Eve/KK 2 weeks

## 2020-09-24 NOTE — Progress Notes (Signed)
[facility] OFFICE  [address] [place], [place] [address]     [name], [place]    Date of Visit:  03/07/2018  Date of Birth: Feb 13, 1932  Age: 87 yrs.   Medical Record Number: [id number]  __  CURRENT DIAGNOSES     1. Mitral valve insufficiency nonrheumatic,  I34.0  2. 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 Atrial Fibrillation, Paroxysmal, I48.0  4. Arrhythmia-PVCs symptomatic, I49.3  5. CHF chronic systolic, I50.22  6. Atrial Septal Defect, Q21.1  7. Shortness of breath, R06.02  8.  Device check automatic implantable cardiac defibrillator, Z45.02  9. Long term (current) use of anticoagulants, Z79.01  10. Cardiac arrest, history of, Z86.74  11. Status post ICD, Z95.810  12. Status post atrial septal defect repair,  Z98.890  13. Carotid Bruit, R09.89  14. Poisoning By Loop Diuretics, Accidental, Subs, T50.1X1D  __  ALLERGIES     Erythromycin Base, Rash  Tikosyn, Intolerance-unknown  __  MEDICATIONS     1. Align 4 mg  capsule, Take as Directed  2. amiodarone 200 mg tablet, 1 po daily  3. carvedilol 12.5 mg tablet, 1 po bid  4. Citracal + D Slow Release 600 mg calcium-500 unit tablet,ext.release, 2 po qd  5. coenzyme Q10 100 mg capsule, 1 po qd   6. fluticasone 100 mcg/actuation blister powder for inhalation, Take as Directed  7. gabapentin 100 mg capsule, 1 po tid  8. Lasix 40 mg tablet, 1/2 tab qd  9. losartan 25 mg tablet, 1 po qd  10. magnesium 200 mg tablet, 1 po qd  11.  multivitamin capsule, 1 po qd  12. potassium chloride ER 10 mEq capsule,extended release, 1 po qd  13. simvastatin 10 mg tablet, 1 po qhs  14. spironolactone 25 mg tablet, 1/2 tab po qd  15. Synthroid 112 mcg tablet, 1 po qd  16. Tylenol  Extra Strength 500 mg tablet, 1 po tid  17. Vitron-C 65 mg iron-125 mg tablet,delayed release, 2 x per week PRN  18. Xarelto 15 mg tablet, 1 po qd  __  CHIEF COMPLAINT/REASON FOR VISIT   Followup of Atrial Fibrillation, Paroxysmal, Followup of CHF chronic systolic, Followup of Mitral valve insufficiency  nonrheumatic and Followup of Shortness of breath  __  HISTORY OF PRESENT ILLNESS   Mrs. Rojero comes in for clinical followup. We discussed her renal function, which has declined somewhat, but is still above the threshold for discontinuing Xarelto. She has not had any bleeding complications and is on no additional antiplatelet agents.  The patient notes that she has not had any change in her weight or additional swelling after lowering her furosemide dose. She does, however, think there might be a slightly increased component of dyspnea on exertion as well as exercise intolerance. We  discussed enrollment in cardiac rehabilitation for this issue. She has done it before and found it to be beneficial.  __  PAST HISTORY      Past Medical Illnesses: celiac disease, Chronic back pain, Intermittent steroid spinal injections, COPD, hypothyroid, pure hypercholesterolemia, kyphoscoliosis;   Past Cardiac Illnesses: CHF chronic systolic, Chest pain, arrythmia-PVCs symptomatic, PFO, Atrial fibrillation-persistent, Mitral regurgitation, Cardiomyopathy,  HFwREF, VT, atrial septal defect, ICD; Infectious Diseases: Staph infection at age 17 -68, Chicken Pox;  Surgical Procedures: Tonsilectomy,Apandectomy , Knee surgery 2002, total R knee replacement, ASD Repair; Trauma History : whip lash 1980; Cardiology Procedures-Invasive: Echo 08, Stress test 08, Cardiac Cath March 2014, Cardioversion May 2015, Cardioversion July 2015, Cardioversion  for Afib August  2016, Cardiac Cath (left and right) October 2017, St Jude dual chamber ICD 05/25/16, Cardiac Cath (right) May 2018, ASD closure 11/11/16, ASD closure 11/11/16, Pacemaker implant January 2019;  Cardiology Procedures-Noninvasive: CT scan 01/27/07, Treadmill Stress Test, MPI (Nuclear) Study January 2013, TEE 07/2011, 06/2012, Holter Monitor July 2015, Echocardiogram 11/2008, 06/2011, 05/2012, 07/2013, 04/2016,  10/2016, 03/2017, Echocardiogram April 2019; Cardiac Cath Results: no significant  obstructive CAD, mild pulm HTN, R to L shunt with Qp:Qs 1.6, LVEDP ;  Left Ventricular Ejection Fraction: LVEF of 50% documented via echocardiogram on 09/29/2017  SOCIAL HISTORY     Alcohol Use: Does not use alcohol; Smoking: Does not smoke; Never smoker ([id number]);  Diet: Regular diet and Caffeine use-1-2 per day; Lifestyle: Married, 2 sons - one in [place] and second in [place];  Exercise: Exercises occasionally;   __  PHYSICAL EXAMINATION     Vital Signs:  Blood Pressure:  108/68 Sitting, Left arm, regular cuff  110/72 Sitting, Right arm,  regular cuff    Weight: 127.40 lbs.  Height: 64"   BMI: 21.87   Pulse: 68/min. Radial Regular        Constitutional: Well developed, chronically ill appearing, in no acute distress, walking with cane Skin:  surgical scars well-healed, warm and dry to touch, erythematous fingertips Head: normocephalic, normal  female hair pattern Eyes: pale conjunctivae ENT:  No pallor or cyanosis Neck: JVP normal, no carotid bruit, thyroid not enlarged  Chest: Normal symmetry, kyphosis, clear to auscultation bilaterally Cardiac: Regular rhythm, S1 normal,  S2 normal, No S3 or S4, soft mid systolic murmur grade 2/6 heard best at the apex Abdomen: bowel sounds normoactive  Peripheral Pulses: bilateral radial pulse(s) 1+ Extremities/Back : 1+ bilateral pretibial edema, kyphosis present Psychiatric: mild forgetfulness  Neurological: oriented to time, person and place, affect appropriate   __    Medications added  today by the physician:  carvedilol 12.5 mg tablet, 1 po bid, 180      IMPRESSIONS:   1.  Mild dyspnea on exertion without overt physical signs of decompensation with reduction   in furosemide dose.  2. Chronic systolic heart failure with LVEF up to 50% after ICD upgrade. Prior ejection fraction   on TEE June 16, 2017, was  22%.  3. Mild renal insufficiency with mild anemia. This is slightly progressive for which her diuretic   dose was slightly reduced on the last visit.  4. Moderate  mitral regurgitation with moderate pulmonary hypertension, improved to mild  mitral   regurgitation and pulmonary hypertension on last echo.  5. Paroxysmal atrial fibrillation, previously persistent but in sinus rhythm after cardioversion   during biventricular ICD upgrade June 28, 2017. Prior failure of amiodarone  with subsequent   extensive left and right atrial ablation. Focal impulse and rotor modulation-guided bitemporal   dispersion ablation September 2017.  6. Xarelto initiated for anticoagulation. Current creatinine clearance is 24 mL per minute  (reduced   dose for creatinine clearance 15-50 mL per minute).  7. Atrial septal defect, status post closure Nov 11, 2016. She completed three months of Plavix.  8. Severe cardiomyopathy with left bundle branch block and heart failure resulting  in biventricular  ICD upgrade June 28, 2017. Last interrogation remotely January 31, 2018.  9. Ventricular fibrillation arrest in the setting of new cardiomyopathy while on Tikosyn, status post   original ICD implant December 2017.  10.  Cardiac catheterization showing no significant coronary disease, Nov 11, 2017.  11. Kyphoscoliosis with moderate restriction. Severe DLCO  abnormality followed by Dr. Stacy Gardner.  12. Hypothyroidism.  13. Intolerance of ACE inhibitor previously  due to hypotension.    RECOMMENDATIONS:   1. Cardiac rehabilitation.  2. Follow up in six weeks at which point we can consider repeating labs  if needed. I have   stressed that she can have her labs done with Dr. Pecola Leisure as she gets them checked regularly.  3. Carotid Doppler as I heard a left carotid bruit today.    Encarnacion Slates, MD     Tid: [id number]    cc:  Danford Bad MD  ____________________________  TODAYS ORDERS  PHASE II CARDIAC REHAB. [facility]  Return Visit 15 MIN 6 weeks   Carotid Duplex - PVL First Available

## 2020-09-24 NOTE — Progress Notes (Signed)
[facility] OFFICE  [address] [place], [place] [address]     [name], [place]    Date of Visit:  08/14/2016  Date of Birth: [birth year]  Age: 87 yrs.   Medical Record Number: [id number]  __  CURRENT DIAGNOSES     1. Cardiomyopathy dilated, I42.0   2. Unspecified atrial fibrillation, I48.91  3. CHF chronic systolic, I50.22  4. Long term (current) use of anticoagulants, Z79.01  5. Cardiac arrest, history of, Z86.74  6. Mitral valve insufficiency nonrheumatic, I34.0  7. Atrial  Septal Defect, Q21.1  8. Shortness of breath, R06.02  __  ALLERGIES    Erythromycin Base,  Rash  Tikosyn, Intolerance-unknown  __  MEDICATIONS     1. Fosamax 70 mg tablet, 1 po qweek   2. Align 4 mg capsule, Take as Directed  3. gabapentin 100 mg capsule, 1 po tid  4. magnesium 250 mg tablet, 1 po qd  5. Tylenol Extra Strength 500 mg tablet, 1 po tid  6. multivitamin capsule, 1 po qd  7. Synthroid 112 mcg tablet,  1 po qd  8. Vitron-C 65 mg iron-125 mg tablet,delayed release, 2 x per week  9. Citracal + D Slow Release 600 mg calcium-500 unit tablet,ext.release, 2 po qd  10. simvastatin 10 mg tablet, 1 po qhs  11. CoQ-10 100 mg capsule, 250 mg per  pt. 1 po qd  12. fluticasone 100 mcg/actuation blister powder for inhalation, Take as Directed  13. Lasix 20 mg tablet, 1 po qd  14. Xarelto 20 mg tablet, qd  15. carvedilol 12.5 mg tablet, 1 po bid  16. potassium chloride ER 10 mEq  tablet,extended release, 1 po qd with lasix  __  CHIEF COMPLAINT/REASON FOR VISIT  Followup of CHF chronic systolic and Followup of Unspecified  atrial fibrillation  __  HISTORY OF PRESENT ILLNESS  [name] is accompanied by her husband for clinical followup. I have not seen her since  November 2017. She had an electrophysiology consult after hospitalization for cardiac arrest May 19, 2016. It was felt that she had a prolonged QT interval, related to Tikosyn therapy, which was used post-ablation to maintain sinus rhythm. This was  discontinued and  she has made a full recovery. She did have a femoral fracture after the fall during arrest that has been repaired. She is undergoing physical therapy for this issue. Overall, we discussed her clinical course and plan going forward. She  apparently discontinued carvedilol because she felt "wiped out" around 2 o'clock or 3 o'clock in the afternoon. I have suggested that she discontinue her ACE inhibitor for the time being and resume carvedilol. She is to call us if she feels significant  fatigue at which point we could reduce the dose.  __  PAST HISTORY     Past Medical Illnesses : celiac disease, Chronic back pain, Intermittent steroid spinal injections, COPD;  Past Cardiac Illnesses : Chest pain 2007, arrythmia 2007, PFO, Atrial fibrillation-persistent, Mitral regurgitation; Infectious Diseases: Staph infection at age 15 -40, Chicken  Pox; Surgical Procedures: Tonsilectomy,Apandectomy , Knee surgery 2002, total R knee replacement; Trauma  History: whip lash 1980; Cardiology Procedures-Invasive: Echo 08, Stress test 08, Cardiac Cath March 2014,  Cardioversion May 2015, Cardioversion July 2015, Cardioversion for Afib August 2016, Cardiac Cath (left and right) October 2017, St Jude dual chamber ICD 05/25/16; Cardiology Procedures-Noninvasive : Echocardiogram June 2010, CT scan 01/27/07, Treadmill Stress Test, MPI (Nuclear) Study January 2013, Echocardiogram January 2013, TEE February 2013, Echocardiogram December  2013, TEE January 2014, Echocardiogram February 2015, Holter Monitor July 2015,  Echocardiogram November 2017; Cardiac Cath Results: no significant obstructive CAD, mild pulm HTN, R to L shunt with Qp:Qs 1.6, LVEDP ;  Left Ventricular Ejection Fraction: LVEF of 25% documented via echocardiogram on 05/19/2016  SOCIAL HISTORY     Alcohol Use: Does not use alcohol; Smoking: Does not smoke; Never smoker ([id number]);  Diet: Regular diet and Caffeine use-1-2 per day; Lifestyle: Married;  Exercise: Exercises  occasionally;   __  PHYSICAL EXAMINATION     Vital Signs:  Blood Pressure:  140/76 Sitting, Left arm, regular cuff  142/78 Sitting, Right arm,  regular cuff    Weight: 135.00 lbs.  Height: 65"   BMI: 22   Pulse: 64/min.        Constitutional: Well developed, well nourished, in no acute distress Skin: warm and dry to touch  Head: normocephalic, normal female hair pattern Eyes:  conjunctivae and lids unremarkable, EOMS intact ENT: No pallor or cyanosis  Neck: carotid pulses are full and equal bilaterally, no JVD, no bruits Chest : Normal symmetry, clear to auscultation bilaterally, no use of accessory muscles, normal respiratory effort, ICD site healing well Cardiac: Regular rhythm,  S1 normal, S2 normal, No S3 or S4, 2/6 systolic murmur LSB Abdomen: bowel sounds normoactive, no bruits  Peripheral Pulses: pulses full and equal in all extremities Extremities/Back : trace bilateral ankle edema Psychiatric: normal memory  Neurological: oriented to time, person and place, affect appropriate   __    Medications added  today by the physician:        IMPRESSIONS:   1. Cardiac arrest, presumably due to prolonged  QT with Tikosyn therapy May 19, 2016   (torsades type). Tikosyn discontinued.  2. Nonischemic cardiomyopathy with chronic systolic heart failure, clinically compensated. Left  ventricular ejection fraction of 25% on echo May 19, 2016.  3. Transseptal ASD with left to right shunt to QT to QS 1.6.  4. Recurrent persistent atrial fibrillation with failure of amiodarone and extensive left and right atrial   ablation and pulmonary vein isolation with FIRM-guided bitemporal  dispersion ablation in   September 2016.  5. 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 Sheliah Mends.  6. Moderate to severe mitral valve regurgitation with biatrial enlargement and severe tricuspid   regurgitation.  Mean PAP 28 mmHg after diuresis on left and right heart catheterization,   April 16, 2016. No  significant obstructive coronary artery disease.  7. Chronic Xarelto anticoagulation due to CHADS-VASc score of 3 based on age and female   gender.   8. History of celiac disease.  9. Chronic anemia.  10. Kyphoscoliosis with component of restriction.  11. Hypothyroidism, on Synthroid.  12. Situational stress, as the primary caregiver for her husband who apparently has some mild    dementia.    RECOMMENDATIONS:   1. The patient was again advised to resume carvedilol and discontinue lisinopril for now. If she is   unable  to tolerate this from the standpoint of fatigue, then she should be advised to reduce her   carvedilol to 6.25 mg twice daily.  2. Return clinical followup in one month.     Encarnacion Slates, MD     Tid: [id number]:GN     cc: Danford Bad MD    eca  ____________________________  Christianne Dolin   Return Visit 15 MIN 1 month

## 2020-09-24 NOTE — Progress Notes (Signed)
[facility] OFFICE  [address] [place], [place] [address]     [name], [place]    Date of Visit:  03/13/2016  Date of Birth: 01/04/1932  Age: 87 yrs.   Medical Record Number: [id number]  __  CURRENT DIAGNOSES     1. Unspecified atrial fibrillation,  I48.91  2. CHF acute on chronic diastolic, I50.33  3. Shortness of breath, R06.02  4. Long term (current) use of anticoagulants, Z79.01  5. Mitral valve insufficiency nonrheumatic, I34.0  __   ALLERGIES    Erythromycin Base, Rash  __  MEDICATIONS      1. Fosamax 70 mg tablet, 1 po qweek  2. Align 4 mg capsule, Take as Directed  3. gabapentin 100 mg capsule, 1 po tid  4. magnesium 250 mg tablet, 1 po qd  5. Tylenol Extra Strength 500 mg tablet, 1 po tid  6. multivitamin  capsule, 1 po qd  7. lisinopril 5 mg tablet, 1 po qd  8. Synthroid 112 mcg tablet, 1 po qd  9. Vitron-C 65 mg iron-125 mg tablet,delayed release, 2 x per week  10. Citracal + D Slow Release 600 mg calcium-500 unit tablet,ext.release, 2  po qd  11. simvastatin 10 mg tablet, 1 po qhs  12. CoQ-10 100 mg capsule, 250 mg per pt. 1 po qd  13. warfarin 5 mg tablet, 1 to 1.5 po qd or as directed by [facility]  14. Lovenox 60 mg/0.6 mL subcutaneous syringe, 1 SQ BID while INR less  than 2  15. Lasix 20 mg tablet, 1 po bid  16. fluticasone 100 mcg/actuation blister powder for inhalation, Take as Directed  __  CHIEF COMPLAINT/REASON FOR VISIT   Followup of Long term (current) use of anticoagulants, Followup of Mitral valve insufficiency nonrheumatic, Followup of Shortness of breath and Followup of Unspecified atrial fibrillation  __  HISTORY  OF PRESENT ILLNESS  The patient returns to the office today for a followup visit. She was seen a week by Dr. Sheliah Mends for worsening dyspnea. The patient was noted to be in mild heart failure. Her Lasix dose  was increased to 40 mg daily. She previously had been taking 20 mg every other day. Her dyspnea has improved substantially. She was also noted to have a decline  in the pulmonary function studies. Laboratory assessment showed elevated BNP to greater than  1300. Since being on Lasix 40 mg daily she has lost five pounds of weight since her last visit here. She is currently feeling better. Her ankle edema has resolved. She has had no anginal chest discomfort. She has had one bout of atrial fibrillation following  her ablation on March 05, 2016, and underwent successful cardioversion at [facility]. The patient is able to tell when she is in atrial fibrillation by checking her pulse and then becoming more short of breath. She has not experienced  this since her last cardioversion.  __  PAST HISTORY     Past Medical Illnesses : celiac disease, Chronic back pain, Intermittent steroid spinal injections, COPD;  Past Cardiac Illnesses : Chest pain 2007, arrythmia 2007, PFO, Atrial fibrillation-persistent, Mitral regurgitation; Infectious Diseases: Staph infection at age 10 -59, Chicken  Pox; Surgical Procedures: Tonsilectomy,Apandectomy , Knee surgery 2002, total R knee replacement; Trauma  History: whip lash 1980; Cardiology Procedures-Invasive: Echo 08, Stress test 08, Cardiac Cath March 2014,  Cardioversion May 2015, Cardioversion July 2015, Cardioversion for Afib August 2016; Cardiology Procedures-Noninvasive: Echocardiogram June 2010, CT scan  01/27/07, Treadmill  Stress Test, MPI (Nuclear) Study January 2013, Echocardiogram January 2013, TEE February 2013, Echocardiogram December 2013, TEE January 2014, Echocardiogram February 2015, Holter Monitor July 2015;  Cardiac Cath Results: 08/2012: EF 45%; RCA 20% stenosis in prox.; mild pulm. htn, no significant obstructive CAD.; Left Ventricular Ejection Fraction : LVEF of 60% documented via echocardiogram on 07/28/2013  ___  FAMILY HISTORY   Father -- No significant family history  Mother -- Influenza/Pneumonia (cause of death)     __  CARDIAC RISK FACTORS     Tobacco Abuse: has never used tobacco;  Family History of  Heart Disease: positive; Hyperlipidemia: positive;  Hypertension: positive;  Diabetes Mellitus: negative;  Prior History of Heart Disease: negative; Obesity: negative;  Sedentary Life Style:positive; [id number]; Menopausal :biological menopause  __  SOCIAL HISTORY    Alcohol Use : Does not use alcohol; Smoking: Does not smoke; Never smoker ([id number]); Diet : Regular diet and Caffeine use-1-2 per day; Lifestyle: Married; Exercise : Exercises occasionally;   __  REVIEW OF SYSTEMS    General : Denies recent weight loss, weight gain, fever or chills or change in exercise tolerance.; Integumentary : Denies any change in hair or nails, rashes, or skin lesions.; Eyes: wears eye glasses/contact lenses;  Ears, Nose, Throat, Mouth: Denies any hearing loss, epistaxis, hoarseness or difficulty speaking.;Respiratory : dyspnea with exertion; Cardiovascular: Please review  HPI; Abdominal : Denies ulcer disease, hematochezia or melena.; Musculoskeletal:history of arthritis; Neurological  : Denies any history of recurrent strokes, TIA, or seizure disorder.; Psychiatric: Denies any history  of depression, substance abuse or change in cognitive functions.; Endocrine: hypothyroidism;  Hematologic/Immunologic: food allergies  __  PHYSICAL EXAMINATION     Vital Signs:  Blood Pressure:  132/68 Sitting, Left arm, regular cuff  128/68 Sitting, Right arm,  regular cuff    Weight: 139.00 lbs.  Height: 65"   BMI: 23   Pulse: 80/min. Apical Regular        Constitutional: Well developed, well nourished, in no acute distress Skin: warm and dry to touch  Head: normocephalic, normal female hair pattern Eyes:  conjunctivae and lids unremarkable, EOMS intact ENT: No pallor or cyanosis  Neck: carotid pulses are full and equal bilaterally, no JVD, no bruits Chest : Normal symmetry, clear to auscultation bilaterally Cardiac: irregular rrhythm, S1 normal, S2 normal, No S3 or S4, Apical impulse not displaced, no murmurs,  gallops or rubs detected.  Abdomen: bowel sounds normoactive, no bruits Peripheral Pulses : pulses full and equal in all extremities Extremities/Back: trace bilateral ankle edema, ecchymosis R  thigh Psychiatric: normal memory Neurological : oriented to time, person and place, affect appropriate   __    Medications added today by the physician:       IMPRESSIONS:   1. Chronic dyspnea, multifactorial due to elements of both diastolic heart failure and COPD with  recent exacerbation of heart failure symptomatology, improved with Lasix.  2.  Persistent atrial fibrillation status post ablation followed by cardioversion on March 05, 2016,  patient maintaining sinus rhythm currently.  3. Moderate to severe mitral valve regurgitation with biatrial enlargement, moderate-to-severe   tricuspid regurgitation and low normal EF.  4. 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 Sheliah Mends.  5. Normal coronary arteries by cardiac catheterization in 2014.  6. History of celiac disease.  7. Chronic  anemia.  8. Hematoma right thigh following ablation, improving.    RECOMMENDATIONS:   1. Continue Lasix 40 mg through the weekend and then  reduce to  20 mg daily.  2. Check a CBC, electrolytes, Protime in one week.  3. Return to the heart failure clinic for reassessment of volume status in three weeks.  4. Followup with PT/INR is scheduled for next week.  5. Daily weights  recommended.  6. Treatment Plan discussed with patient and husband.    Norville Haggard, MD, [facility]     Tid: [id number]:YN:WG    cc: Danford Bad MD    eca   ____________________________  TODAYS ORDERS  CBC without Differential 1 week  Basic Metabolic Panel 1 week   PT/INR 7 days  Heart Failure Clinic Visit 3 weeks  12 Lead ECG Today

## 2020-09-24 NOTE — Progress Notes (Signed)
[facility] OFFICE  [address] [place], [place] [address]     [name], [place]    Date of Visit:  09/18/2015  Date of Birth: May 21, 1932  Age: 87 yrs.   Medical Record Number: [id number]  Referring Physician: Vincente Poli MD, Jamie Kato  __   CURRENT DIAGNOSES     1. Mitral valve insufficiency nonrheumatic, I34.0  2. Unspecified atrial fibrillation, I48.91  3. Shortness of breath, R06.02  4. Long term (current) use of anticoagulants,  Z79.01  __  ALLERGIES    Erythromycin Base, Rash   __  MEDICATIONS     1. Fosamax 70 mg tablet, 1 po qweek  2. Align 4 mg capsule, Take as Directed  3. gabapentin 100 mg capsule, 1 po tid   4. magnesium 250 mg tablet, 1 po qd  5. Tylenol Extra Strength 500 mg tablet, 1 po tid  6. multivitamin capsule, 1 po qd  7. lisinopril 5 mg tablet, 1 po qd  8. Synthroid 112 mcg tablet, 1 po qd  9. Vitron-C 65 mg iron-125 mg tablet,delayed  release, 2 x per week  10. Lasix 20 mg tablet, 1 po qod  11. Citracal + D Slow Release 600 mg calcium-500 unit tablet,ext.release, 2 po qd  12. CoQ-10 100 mg capsule, 2 po qd  13. Warfarin 5 Mg Tablet, 1 po qd or as directed by [facility]   14. simvastatin 10 mg tablet, 1 po qhs  __  CHIEF COMPLAINT/REASON FOR VISIT  Followup of Long term (current) use of anticoagulants, Followup of  Mitral valve insufficiency nonrheumatic, Followup of Unspecified atrial fibrillation and Pre-Operative Cardiovascular Evaluation  __  HISTORY OF PRESENT ILLNESS   [name] is an 87 year old female who presents to the office today for preoperative cardiac evaluation. She is scheduled to undergo right total knee arthroplasty on October 15, 2014 with Dr. Alma Friendly. She has a history of paroxysmal atrial fibrillation,  mitral regurgitation and obstructive lung disease. She denies chest pain, pressure, tightness, dizziness, presyncope or syncope. She does have some shortness of breath related to her obstructive lung disease; however, this is stable. She is currently   in atrial fibrillation and was seen by EP in the office yesterday, September 17, 2015. She was discontinued off her amiodarone with plans for possible ablation following her knee surgery. She continues Coumadin for anticoagulation without any bleeding issues.  She had a cardiac catheterization in 2014, which showed normal coronary arteries. Her blood pressure is controlled in the office today upon repeat at 108/78.  __  PAST HISTORY      Past Medical Illnesses: celiac disease, Chronic back pain, Intermittent steroid spinal injections, COPD;   Past Cardiac Illnesses: Chest pain 2007, arrythmia 2007, PFO, Atrial fibrillation-persistent, Mitral regurgitation; Infectious Diseases : Staph infection at age 79 -59, Chicken Pox; Surgical Procedures: Tonsilectomy,Apandectomy , Knee surgery 2002;  Trauma History: whip lash 1980; Cardiology Procedures-Invasive: Echo 08, Stress test 08, Cardiac Cath March  2014, Cardioversion May 2015, Cardioversion July 2015, Cardioversion for Afib August 2016; Cardiology Procedures-Noninvasive: Echocardiogram June 2010,  CT scan 01/27/07, Treadmill Stress Test, MPI (Nuclear) Study January 2013, Echocardiogram January 2013, TEE February 2013, Echocardiogram December 2013, TEE January 2014, Echocardiogram February 2015, Holter Monitor July 2015;  Cardiac Cath Results: 08/2012: EF 45%; RCA 20% stenosis in prox.; mild pulm. htn, no significant obstructive CAD.; Left Ventricular Ejection Fraction : LVEF of 60% documented via echocardiogram on 07/28/2013  PHYSICAL EXAMINATION    Vital Signs :  Blood Pressure:  142/90 Sitting, Left arm, regular cuff  140/90 Sitting, Right arm, regular cuff     Weight: 146.00 lbs.  Height: 65"  BMI:  24   Pulse: 80/min. Apical       Constitutional:  Well developed, well nourished, in no acute distress Skin: warm and dry to touch Head : normocephalic, normal female hair pattern Eyes: conjunctivae and lids unremarkable, EOMS intact  ENT: No pallor or cyanosis Neck:  carotid  pulses are full and equal bilaterally, no JVD, no bruits Chest: Normal symmetry, clear to auscultation bilaterally  Cardiac: irregular rrhythm, S1 normal, S2 normal, No S3 or S4, Apical impulse not displaced, no murmurs, gallops or rubs detected. Abdomen : bowel sounds normoactive, no bruits Peripheral Pulses: pulses full and equal in all extremities  Extremities/Back: Normal muscle strength and tone., no edema present, kyphosis present, scoliosis present Psychiatric : normal memory Neurological: oriented to time, person  and place, affect appropriate   __    Medications added today by the physician:    IMPRESSIONS:    1. Preoperative cardiac evaluation for right total knee arthroplasty April 25 with   Dr. Alma Friendly.  2. Paroxysmal atrial fibrillation. Currently in atrial fibrillation. Anticoagulated with Coumadin.  Plan for possible ablation  following knee surgery.  3. Moderate to severe MR with biatrial enlargement and moderate to severe TR on echo February  2015, markedly improved after restoration of sinus rhythm on last echo July 27, 2014 to  mild degree of regurgitation.   4. 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 Stacy Gardner.  5. Transesophageal echo 2014: Etiology of MR was anular dilatation.  6. Cardiac catheterization September 08, 2012: Normal coronary arteries with relatively compensated   hemodynamics and only moderate MR.  7. Kyphoscoliosis with some component of restriction in activity due to back pain and knee pain.  8. Degenerative joint disease of the knees.   9. Right sided neck fullness with remote negative CT of the  neck and 2008.  10. Normal CT of the abdomen and pelvis in 2009 with history of celiac disease.  11. Anemia, followed by Dr. Pecola Leisure.  12. Hypothyroidism, on Synthroid.    RECOMMENDATIONS:    1. The above was discussed with Dr. Tery Sanfilippo. She had a cardiac catheterization in 2014 which   showed normal coronaries. She is at a low to moderate cardiac  risk. She may hold Coumadin   for four days prior to surgery and resume postoperatively.    2. She will be called once her echocardiogram (August 14, 2015) has been interpreted.  3. Continue current medications.  4. Follow-up with Dr. Talmadge Chad in August as previously scheduled.     Florence Canner. Loretha Brasil: [phone]:     cc: Ellard Artis MD  Danford Bad MD    Enc: ECG, PT/INR RESULTS     EL

## 2020-09-24 NOTE — Progress Notes (Signed)
[facility] OFFICE  [address] [place], [place] [address]     TELEMEDICINE VISIT       [place], [place]    Date of Visit:  12/14/2018  Date of Birth: 12-Jul-1931  Age: 87 yrs.   Medical Record Number: [id number]  __  CURRENT DIAGNOSES     1. Mitral valve insufficiency nonrheumatic,  I34.0  2. 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 Atrial Fibrillation, Paroxysmal, I48.0  4. Arrhythmia-PVCs symptomatic, I49.3  5. CHF chronic systolic, I50.22  6. Atrial Septal Defect, Q21.1  7. Shortness of breath, R06.02  8.  Device check automatic implantable cardiac defibrillator, Z45.02  9. Long term (current) use of anticoagulants, Z79.01  10. Cardiac arrest, history of, Z86.74  11. Status post ICD, Z95.810  12. Status post atrial septal defect repair,  Z98.890  13. Carotid Bruit, R09.89  14. Poisoning By Loop Diuretics, Accidental, Subs, T50.1X1D  __  ALLERGIES     Erythromycin Base, Rash  Tikosyn, Intolerance-unknown  __  MEDICATIONS     1. Align 4 mg  capsule, Take as Directed  2. gabapentin 100 mg capsule, 1 po tid  3. Tylenol Extra Strength 500 mg tablet, 1 po tid  4. multivitamin capsule, 1 po qd  5. Citracal + D Slow Release 600 mg calcium-500 unit tablet,ext.release, 2 po qd   6. simvastatin 10 mg tablet, 1 po qhs  7. fluticasone 100 mcg/actuation blister powder for inhalation, Take as Directed  8. coenzyme Q10 100 mg capsule, 1 po qd  9. potassium chloride ER 10 mEq capsule,extended release, 1 po qd  10. Amiodarone  200 Mg Tablet, 1 po daily  11. spironolactone 25 mg tablet, 1/2 tab po qd  12. Synthroid 112 mcg tablet, 1 po qd  13. carvedilol 12.5 mg tablet, 1 po bid  14. losartan 25 mg tablet, 1 po qd  15. magnesium 200 mg tablet, 1 po qd   16. Xarelto 15 Mg Tablet, 1 po qd  17. Lasix 40 mg tablet, 1/2 tab qd  __  CHIEF COMPLAINT/REASON FOR VISIT  Followup of Atrial Fibrillation,  Paroxysmal, Followup of Atrial Septal Defect, Followup of CHF chronic systolic and Followup of Shortness of breath  __  HISTORY OF  PRESENT ILLNESS   The visit today was conducted via telemedicine due to COVID-19 precautions. The patient was in their home and was informed and gave verbal consent to proceed. Mrs. Urquiza is seen for a telehealth visit using [employer]. She is doing quite well overall without  palpitations, lightheadedness or presyncope. She does not describe chest discomfort. Her occasional shortness of breath is unchanged, and she has not had any significant weight gain or development of peripheral edema. Her last echocardiogram showed significant  improvement and now normal left ventricular function. It appears that she is in good shape, and fortunately, this is the case as she is anticipating relocation to [place], [place]. We are going to summarize her history briefly to assist with  continuity of care.    I had the pleasure of meeting Mrs. Casella in May 2007 when she presented for evaluation of multivalvular regurgitation noted on an outside echocardiogram. A repeat study through our practice showed mild to moderate aortic  insufficiency in 2008. She was seen until January 2012 by her primary care provider, and risk factors addressed. She was noted incidentally to be in atrial fibrillation without symptoms at that time. A repeat echocardiogram now showed aortic insufficiency  as well as mitral regurgitation with a preserved  EF. She was placed on anticoagulant therapy with Xarelto and underwent transesophageal-guided cardioversion in February 2013. She noted a significant improvement in her symptoms and exercise capacity, albeit  limited by other factors such as chronic kyphoscoliosis and restriction. She was noted to have a patent foramen ovale at the time of her transesophageal echo. Sometime in the summer, her energy declined, and she was again noted to be in atrial fibrillation  in December 2013. At that point, electrophysiology consultation was requested and a repeat echocardiogram was performed. Surprisingly, she  seemed to have progression of valvular heart disease with bi-atrial enlargement and moderate to severe mitral regurgitation.  It was felt that her valvular disease should be addressed first. She underwent cardiac catheterization in March 2014, which showed minimally abnormal hemodynamics. On ventriculography, it was felt that the degree of mitral regurgitation was less than  that previously noted on her echocardiogram. Fortunately, she had no significant obstructive coronary artery disease. There was some evidence of mild pulmonary hypertension, and she had normal cardiac output. Following this, she underwent optimization  of her medical regimen and pulmonary rehabilitation. She was diagnosed with moderate COPD and improved somewhat clinically; however, over time she reached a threshold of functional improvement, at which point electrophysiology was consulted again. In  May 2015, she underwent initiation of Tikosyn followed by electrical cardioversion and restoration of sinus rhythm. The patient felt significant immediate improvement for the first three days while she was in sinus rhythm. Unfortunately, she reverted  back to atrial fibrillation in June 2015 and was felt to be a dofetilide failure. She underwent a repeat cardioversion in July 2015 after being loaded with amiodarone. This worked well, and she was clinically improved. She maintained normal rhythm over  the next six months on amiodarone therapy with significant improvement in her quality of life. There was some apprehension regarding long-term amiodarone therapy based on her underlying lung disease. At that point, the decision was made to consider pulmonary  vein isolation as a more definitive solution for her arrhythmia. Ultimately, this was performed in September 2017 to include pulmonary vein isolation, bi-atrial substrate modification guided by mapping and right atrial isthmus ablation. Following that,  she did develop an episode of heart failure  felt to be diastolic in origin and resolved with initiation of diuretic therapy. She was noted to have evidence of significant intracardiac shunting and left ventricular systolic dysfunction on subsequent echo  October 2017. She was felt to have a significant intracardiac shunt with Qp:Qs of 1.6 and ultimately required PFO closure. In the interval, she did get admitted with an episode of torsade associated with Tikosyn. This had been reloaded post-ablation.  It was felt that she merit a secondary prevention ICD. This was placed in December 2017. Due to persistent clinical right-sided heart failure, she underwent atrial septal defect closure with an Amplatzer 13 mm atrial septal defect occluder device May  2018. Since that time, she has gradually improved and ultimately underwent biventricular ICD upgrade June 28, 2017. This resulted in significant improvement in left ventricular function to the present. Her echo from December 09, 2018, showed an EF of 60%  with moderate mitral regurgitation and tricuspid regurgitation. Her pulmonary hypertension resolved, and left atrial size improved as well.  __  PAST HISTORY      Past Medical Illnesses: celiac disease, Chronic back pain, Intermittent steroid spinal injections, COPD, hypothyroid, pure hypercholesterolemia, kyphoscoliosis;   Past Cardiac Illnesses: CHF chronic systolic, Chest pain, arrythmia-PVCs symptomatic, PFO, Atrial fibrillation-persistent, Mitral regurgitation,  Cardiomyopathy,  HFwREF, VT, atrial septal defect, ICD; Infectious Diseases: Staph infection at age 14 -76, Chicken Pox;  Surgical Procedures: Tonsilectomy,Apandectomy , Knee surgery 2002, total R knee replacement, ASD Repair; Trauma History : whip lash 1980; Cardiology Procedures-Invasive: Echo 08, Stress test 08, Cardiac Cath March 2014, Cardioversion May 2015, Cardioversion July 2015, Cardioversion  for Afib August 2016, Cardiac Cath (left and right) October 2017, St Jude dual chamber ICD 05/25/16,  Cardiac Cath (right) May 2018, ASD closure 11/11/16, ASD closure 11/11/16, Pacemaker implant January 2019;  Cardiology Procedures-Noninvasive: CT scan 01/27/07, Treadmill Stress Test, MPI (Nuclear) Study January 2013, TEE 07/2011, 06/2012, Holter Monitor July 2015, Echocardiogram 11/2008, 06/2011, 05/2012, 07/2013, 04/2016,  10/2016, 03/2017, Echocardiogram April 2019, Echocardiogram June 2020; Cardiac Cath Results: no significant obstructive CAD, mild pulm HTN, R to L shunt  with Qp:Qs 1.6, LVEDP ; Left Ventricular Ejection Fraction: LVEF of 60% documented via echocardiogram on 12/09/2018;  Peripheral Vascular Procedures: Carotid NIVA September 2019; Peripheral Vasc Procedure Results: 02/2018-  R carotid 50% stenosis, L 50-69% stenosis, repeat 34yr.  ___  FAMILY HISTORY  Father --  No significant family history  Mother -- Influenza/Pneumonia (cause of death)    __  CARDIAC RISK FACTORS      Tobacco Abuse: has never used tobacco; Family History of Heart Disease: positive;  Hyperlipidemia: positive; Hypertension: positive;   Diabetes Mellitus: negative; Prior History of Heart Disease: positive;  Obesity: negative; Sedentary Life Style:positive;  ZOX:[id number]; Menopausal:biological menopause  __   SOCIAL HISTORY    Alcohol Use: Does not use alcohol;  Smoking: Does not smoke; Never smoker ([id number]); Diet: Regular diet and Caffeine use-1-2 per day;  Lifestyle: Married, 2 sons - one in [place] and second in [place]; Exercise: Exercises occasionally;   __   PHYSICAL EXAMINATION    Vital Signs:  Blood Pressure:  105/66 Telehealth L arm  113/76  Telehealth R arm    Weight: 140.00 lbs.  Height:  64.0"  BMI: 24.03   Pulse: 70/min. Telehealth  per pt record         Medications added today by the physician:  Lasix 40 mg tablet, 1/2 tab qd, 45  Xarelto 15 Mg Tablet, 1 po qd, 90        IMPRESSIONS:     1.  Chronic dyspnea on exertion, clinically stable. This is related to underlying chronic systolic CHF (EF now normalized), contribution from  musculoskeletal constraint, MR and COPD.  2. Chronic systolic heart failure with LVEF now normal at 60% after  ICD upgrade. Prior ejection fraction on TEE, June 16, 2017, was 22%.  3. Moderate mitral regurgitation with normal recent pulmonary pressures, but previous moderate pulmonary hypertension.  4. Paroxysmal atrial fibrillation, previously persistent  but in sinus rhythm after cardioversion during biventricular ICD upgrade June 28, 2017. Prior failure of amiodarone with subsequent extensive left and right atrial ablation. Focal impulse and rotor modulation guided bitemporal dispersion ablation September  2017.  5. Ventricular fibrillation arrest in the setting of new cardiomyopathy while on Tikosyn, status post original ICD implant December 2017.  6. Atrial septal defect status post closure Nov 11, 2016. She completed three months of Plavix.   7. Severe cardiomyopathy with left bundle branch block and heart failure resulting in biventricular ICD upgrade June 28, 2017. Last interrogation June 16, 2018.  8. Chronic Xarelto anticoagulation initiated in 2012 with transient interruption  for Coumadin anticoagulation and now back on Xarelto (reduced dose for creatinine clearance 15-50 mL per minute).  9. Cardiac  catheterization showing no significant coronary artery disease, last Nov 11, 2017, and previously in 2014.  10. Kyphoscoliosis  with moderate restriction. Severe DLCO abnormality followed by Dr. Stacy Gardner.  11. Intolerance of ACE inhibitor previously due to hypotension.  12. Hypothyroidism.    RECOMMENDATIONS:     1. The patient appears to be doing quite well  given her multiple comorbidities. Her biventricular ICD has been instrumental improving left ventricular function, and she has maintained sinus rhythm. She continues to have moderate valvular heart disease, but improved filling pressures and resolution  of pulmonary hypertension.  2. The plan would be to continue her current medical regimen  which includes carvedilol, losartan, spironolactone and furosemide for heart failure as well as low-dose amiodarone and Xarelto for maintenance of sinus rhythm.   3. We wish her well in transition to care in [place]. We are certainly available for any questions, and she knows that she can contact us at any time for followup.    Encarnacion Slates, MD     Tid: [id number]:LK    cc: Danford Bad MD

## 2020-09-24 NOTE — Progress Notes (Signed)
[facility]  [address] [place], [place] [address]     [name], [place]    Date of Visit:  07/17/2014  Date of Birth: 01/08/1932  Age: 87 yrs.   Medical Record Number: [id number]  __  CURRENT DIAGNOSES     1. Mitral Regurgitation, 424.0   2. Mitral valve insufficiency nonrheumatic, I34.0  3. Unspecified atrial fibrillation, I48.91  4. Long term (current) use of anticoagulants, Z79.01  5. Shortness of breath, 786.05  __   ALLERGIES    Erythromycin Base, Rash  __  MEDICATIONS      1. Fosamax 70 mg tablet, 1 po qweek  2. Align 4 mg capsule, Take as Directed  3. gabapentin 100 mg capsule, 1 po tid  4. simvastatin 20 mg tablet, 1 po qd  5. magnesium 250 mg tablet, 1 po qd  6. Tylenol Extra Strength  500 mg tablet, 1 po tid  7. multivitamin capsule, 1 po qd  8. Citracal + D 250 mg calcium- 250 unit tablet, chewable, 1 po qd  9. Claritin 10 mg tablet, 1 po qd  10. CoQ-10 100 mg capsule, 1 po qd  11. lisinopril 5 mg tablet, 1 po  qd  12. warfarin 5 mg tablet, 1 po qd or as directed by [facility]  13. amiodarone 200 mg tablet, 1 po qd  14. Synthroid 112 mcg tablet, 1 po qd  15. Vitron-C 65 mg iron-125 mg tablet,delayed release, 2 x per week  16. Lasix 20 mg tablet,  1 po qod  __  CHIEF COMPLAINT/REASON FOR VISIT  fu AF  __  HISTORY OF PRESENT ILLNESS   The patient is an 87 year old female with a history of persistent atrial fibrillation, hypothyroidism, Celiac disease and significant valvular disease including moderate mitral and tricuspid regurgitation and biatrial dilatation. I initially saw her during  a patient consultation at [facility] during a Tikosyn initiation. She unfortunately reverted back to atrial fibrillation within a few days although did notice significant symptomatic improvement. She was initiated on amiodarone and underwent cardioversion  in July 2015 and since that time has been maintaining normal rhythm. She has been in normal rhythm in September follow-up as well as in  today's clinic visit. She is followed by Dr. Finis Bud with PFT testing which shows severe obstruction, hyperinflation,  severe DLC0 abnormalities. She had repeat PFTs in December which now shows moderate obstruction with air trapping but continued severe DLC0 abnormalities. I did discuss her case with Dr. Stacy Gardner who felt comfortable with her continuing amiodarone therapy  for the time being. Again, patient noted significant dyspnea while in atrial fibrillation which did improve since maintaining normal rhythm which has significantly improved her quality of life. All other review of systems are negative.  __   PAST HISTORY     Past Medical Illnesses: celiac disease, Chronic back pain, Intermittent steroid spinal  injections, COPD;  Past Cardiac Illnesses: Chest pain 2007, arrythmia 2007, PFO, Atrial fibrillation-persistent,  Mitral regurgitation; Infectious Diseases: Staph infection at age 83 -45, Chicken Pox; Surgical Procedures : Tonsilectomy,Apandectomy , Knee surgery 2002; Trauma History: whip lash 1980; Cardiology Procedures-Invasive : Echo 08, Stress test 08, Cardiac Cath March 2014, Cardioversion May 2015, Cardioversion July 2015; Cardiology Procedures-Noninvasive: Echocardiogram  June 2010, CT scan 01/27/07, Treadmill Stress Test, MPI (Nuclear) Study January 2013, Echocardiogram January 2013, TEE February 2013, Echocardiogram December 2013, TEE January 2014, Echocardiogram February 2015, Holter Monitor July 2015;  Cardiac Cath Results: 08/2012: EF 45%; RCA 20%  stenosis in prox.; mild pulm. htn, no significant obstructive CAD.; Left Ventricular Ejection Fraction : LVEF of 60% documented via echocardiogram on 07/28/2013  SOCIAL HISTORY    Alcohol Use : Does not use alcohol; Smoking: Does not smoke; Never smoker ([id number]); Diet : Regular diet and Caffeine use-1-2 per day; Lifestyle: Married; Exercise : Exercises occasionally;   __  PHYSICAL EXAMINATION    Vital Signs :  Blood Pressure:  136/70 Sitting, Left  arm, regular cuff  136/70 Sitting, Right arm, regular cuff     Weight: 150.00 lbs.  Height: 65"  BMI:  25   Pulse: 65/min.       Constitutional:  Cooperative, alert and oriented,well developed, well nourished, in no acute distress., Breathing comfortably Skin: warm and dry to touch  Head: normocephalic, normal female hair pattern Eyes:  conjunctivae and lids unremarkable, EOMS intact ENT: No pallor or cyanosis  Neck: carotid pulses are full and equal bilaterally, no JVD, no bruits Chest : Normal symmetry, clear to auscultation bilaterally, normal A-P diameter, kyphosis, No ronchi with good air movement Cardiac: Normal 1st and 2nd heart  sounds without murmur or gallop, regular, 2/6 systolic blowing murmur at LLSB Abdomen: bowel sounds normoactive, no bruits  Peripheral Pulses: pulses full and equal in all extremities Extremities/Back : Normal muscle strength and tone., no edema present, kyphosis present, scoliosis present Psychiatric:  normal memory Neurological: No gross motor or sensory deficits noted, affect appropriate, oriented to  time, person and place.   __    Medications added today by the physician:      ECG:   ECG performed on today's clinic visit shows sinus rhythm with a left anterior fascicular block and a normal QTc.    IMPRESSIONS:  1. Persistent  atrial fibrillation with failure of Tikosyn, now on amiodarone maintaining normal  rhythm.  2. Moderate to severe tricuspid regurgitation and moderate mitral regurgitation and moderate  aortic regurgitation without any operative plan at this  time.  3. Severe left atrial dilatation.  4. Severe DLC0 abnormality and moderate obstructive lung disease followed by Dr. Stacy Gardner with  PFTs performed in December.  5. Hypothyroidism on Synthroid.     ASSESSMENT/PLAN:  It was a pleasure to see Ms. Bennion back in follow-up. She has been maintaining normal rhythm over the last six months on amiodarone therapy with a significant improvement of her quality  of life. She  was symptomatic despite rate controlled atrial fibrillation and thus maintaining a rhythm control strategy is clearly in her benefit. Obviously with her underlying lung disease there is understandable apprehension of long-term amiodarone  therapy. Her recent PFTs are stable although she clearly does not have much reserve. Will continue to maintain our dialogue with Dr. Stacy Gardner to continue to assess feasibility of long-term amiodarone therapy. I agree with prior assessments that pulmonary  vein isolation has lower likelihood of success but may need to consider as an option but will defer for the time being. Patient will continue on Coumadin for thromboembolic risk reduction which she is tolerating tell. Will check liver function tests and  thyroid function tests during this visit. Additionally we have arranged for repeat echocardiogram in order to evaluation for any evidence of valvular progression and she will schedule a follow-up with Dr. Talmadge Chad to that end. I will plan to see the  patient in six months' time but am always available sooner should issues arise.     Jarvis Morgan Amado Coe, MD    ASF/ds    cc:  WHITNEY E. REESE MD  Finis Bud MD    TPA  ____________________________   TODAYS ORDERS  2D, color flow, doppler At [facility]  EP Return Visit 6 months  ALT - Alanine Aminotransferase [facility]  AST - Aspartate Aminotransferase At [facility]  TSH [facility]  12 Lead ECG Today

## 2020-09-24 NOTE — Progress Notes (Signed)
[facility]  [address] [place], [place] [address]     [name], [place]    Date of Visit:  03/30/2016  Date of Birth: 19-Jan-1932  Age: 87 yrs.   Medical Record Number: [id number]  __  CURRENT DIAGNOSES     1. Unspecified atrial fibrillation,  I48.91  2. CHF acute on chronic diastolic, I50.33  3. Shortness of breath, R06.02  4. Long term (current) use of anticoagulants, Z79.01  5. Mitral valve insufficiency nonrheumatic, I34.0  __   ALLERGIES    Erythromycin Base, Rash  __  MEDICATIONS      1. Fosamax 70 mg tablet, 1 po qweek  2. Align 4 mg capsule, Take as Directed  3. gabapentin 100 mg capsule, 1 po tid  4. magnesium 250 mg tablet, 1 po qd  5. Tylenol Extra Strength 500 mg tablet, 1 po tid  6. multivitamin  capsule, 1 po qd  7. lisinopril 5 mg tablet, 1 po qd  8. Synthroid 112 mcg tablet, 1 po qd  9. Vitron-C 65 mg iron-125 mg tablet,delayed release, 2 x per week  10. Citracal + D Slow Release 600 mg calcium-500 unit tablet,ext.release, 2  po qd  11. simvastatin 10 mg tablet, 1 po qhs  12. CoQ-10 100 mg capsule, 250 mg per pt. 1 po qd  13. warfarin 5 mg tablet, 1 to 1.5 po qd or as directed by [facility]  14. fluticasone 100 mcg/actuation blister powder for inhalation, Take  as Directed  15. Lasix 20 mg tablet, 1 po qd  16. Tikosyn 250 mcg capsule, 1 PO BID    __  HISTORY OF PRESENT ILLNESS  [name] comes  in today for followup after an extensive ablation for atrial fibrillation with a PVI, bilateral FIRM with temporal dispersion as well as right isthmus ablation February 26, 2016. She was discharged on Tikosyn post ablation and then had recurrent arrhythmias  that required a cardioversion mid September. She misunderstood discharge instructions and did not restart Coumadin right away and therefore was subtherapeutic during an office visit the next week and was bridged with Lovenox. She has failed Tikosyn and  amiodarone in the past and at the recent cardioversion a TEE showed her EF had  decreased to 45-50%. She had a recent INR check last week that showed an INR of 2.6. Since the cardioversion she believes she has had brief fluttering in her chest but nothing  prolonged. She denies lightheadedness, shortness of breath or syncope. The patient complains of left ankle redness and edema. Her skin is warm to the touch.   __  PAST HISTORY      Past Medical Illnesses: celiac disease, Chronic back pain, Intermittent steroid spinal injections, COPD;   Past Cardiac Illnesses: Chest pain 2007, arrythmia 2007, PFO, Atrial fibrillation-persistent, Mitral regurgitation; Infectious Diseases : Staph infection at age 75 -23, Chicken Pox; Surgical Procedures: Tonsilectomy,Apandectomy , Knee surgery 2002, total R knee replacement;  Trauma History: whip lash 1980; Cardiology Procedures-Invasive: Echo 08, Stress test 08, Cardiac Cath March  2014, Cardioversion May 2015, Cardioversion July 2015, Cardioversion for Afib August 2016; Cardiology Procedures-Noninvasive: Echocardiogram June 2010,  CT scan 01/27/07, Treadmill Stress Test, MPI (Nuclear) Study January 2013, Echocardiogram January 2013, TEE February 2013, Echocardiogram December 2013, TEE January 2014, Echocardiogram February 2015, Holter Monitor July 2015;  Cardiac Cath Results: 08/2012: EF 45%; RCA 20% stenosis in prox.; mild pulm. htn, no significant obstructive CAD.; Left Ventricular Ejection Fraction : LVEF of 60% documented  via echocardiogram on 07/28/2013  ___  FAMILY HISTORY   Father -- No significant family history  Mother -- Influenza/Pneumonia (cause of death)     __  CARDIAC RISK FACTORS     Tobacco Abuse: has never used tobacco;  Family History of Heart Disease: positive; Hyperlipidemia: positive;  Hypertension: positive;  Diabetes Mellitus: negative;  Prior History of Heart Disease: negative; Obesity: negative;  Sedentary Life Style:positive; [id number]; Menopausal :biological menopause  __  SOCIAL HISTORY    Alcohol Use : Does not use alcohol;  Smoking: Does not smoke; Never smoker ([id number]); Diet : Regular diet and Caffeine use-1-2 per day; Lifestyle: Married; Exercise : Exercises occasionally;   __  PHYSICAL EXAMINATION    Vital Signs :  Blood Pressure:  116/66 Sitting, Left arm, regular cuff  122/64 Sitting, Right arm, regular cuff     Weight: 137.00 lbs.  Height: 62"  BMI:  25   Pulse: 75/min. Apical Regular   Respirations:  16/min. regular and relaxed      Constitutional: Well developed, well nourished, in no acute  distress Skin: warm and dry to touch Head:  normocephalic, normal female hair pattern Eyes: conjunctivae and lids unremarkable, EOMS intact ENT : No pallor or cyanosis Neck: carotid pulses are full  and equal bilaterally, no JVD, no bruits Chest: Normal symmetry, clear to auscultation bilaterally  Cardiac: RRR no m/r/g Abdomen: bowel sounds normoactive, no bruits  Peripheral Pulses: pulses full and equal in all extremities Extremities/Back : trace bilateral ankle edema, ecchymosis R thigh Psychiatric: normal memory  Neurological: oriented to time, person and place, affect appropriate   __    Medications added  today by the physician:  Tikosyn 250 mcg capsule, 1 PO BID, 180      ECG:   ECG today demonstrates  sinus rhythm with intact conduction at a rate of 74 beats per minute with two PVCs noted. QTc interval is stable at 435 msec.     IMPRESSIONS:    1. Recurrent atrial fibrillation, now status post extensive bilateral Focal Impulse Rotor Modulation  with temporal dispersion, pulmonary vein isolation, and right isthmus ablation February 26, 2016.  a. Recurrences postoperatively, one requiring  cardioversion March 06, 2016,  with only brief recurrences since.  b. Reloaded on Tikosyn post ablation.  c. 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 Lucillie Garfinkel.   2. CHA2DS2-VASc  score of three, continues on Coumadin, now with therapeutic INRs, previously  had to be bridged with Lovenox post  ablation when INRs were subtherapeutic.   3. Transesophageal echocardiogram March 06, 2016, showing a mildly depressed left ventricular   function 45-50%, likely tachy-mediated.   4. Scoliosis.   5. Right ankle erythema and edema with skin warm to the touch, question of cellulitis.     PLAN:   From an arrhythmia standpoint, Ms. Arena has had brief flutterings in her chest since the cardioversion in the middle of September but nothing prolonged. She continues on Tikosyn and Coumadin with therapeutic INRs. QTc interval today was stable at 435  msec on 250 mcg b.i.d. of Tikosyn. She did have an extensive ablation and therefore we will likely wait longer to wean her off Tikosyn post procedure. I have asked her to follow up with her primary care for what appears to be the beginnings of cellulitis  in her left ankle area. We will plan to see her back in three months' time, obviously sooner should problems arise.  Teresa Slot, [name]    SG/tutlc      cc: Danford Bad MD    eca   ____________________________  TODAYS ORDERS  2D, color flow, doppler 1 week  ZO:XWRUEAV Education ICD-10:  I48.91 MedlinePlus Connect results for ICD-10 I48.91  Diet mgmt edu, guidance and counseling TODAY  12 Lead ECG Today  EP Return Visit 3 months

## 2020-09-24 NOTE — Progress Notes (Signed)
[facility]  [address] [place], [place] [address]     [name], [place]    Date of Visit:  01/11/2015  Date of Birth: [birth year]  Age: 87 yrs.   Medical Record Number: [id number]  __  CURRENT DIAGNOSES     1. Unspecified atrial fibrillation,  I48.91  2. Shortness of breath, R06.02  3. Long term (current) use of anticoagulants, Z79.01  4. Mitral valve insufficiency nonrheumatic, I34.0  __  ALLERGIES     Erythromycin Base, Rash  __  MEDICATIONS     1. Fosamax 70 mg tablet, 1 po qweek  2. Align  4 mg capsule, Take as Directed  3. gabapentin 100 mg capsule, 1 po tid  4. simvastatin 20 mg tablet, 1 po qd  5. magnesium 250 mg tablet, 1 po qd  6. Tylenol Extra Strength 500 mg tablet, 1 po tid  7. multivitamin capsule, 1 po qd   8. Claritin 10 mg tablet, 1 po qd  9. lisinopril 5 mg tablet, 1 po qd  10. warfarin 5 mg tablet, 1 po qd or as directed by [facility]  11. Synthroid 112 mcg tablet, 1 po qd  12. Vitron-C 65 mg iron-125 mg tablet,delayed release, 2 x per  week  13. Lasix 20 mg tablet, 1 po qod  14. amiodarone 200 mg tablet, 1 po qd  15. Citracal + D Slow Release 600 mg calcium-500 unit tablet,ext.release, 2 po qd  16. CoQ-10 100 mg capsule, 2 po qd  __   CHIEF COMPLAINT/REASON FOR VISIT  f/u AF  __  HISTORY OF PRESENT ILLNESS  Patient is an 45 -year-old  female with a history of persistent atrial fibrillation, hypothyroidism, Celiac disease and significant valvular disease. She has failed Tikosyn therapy and has been on amiodarone since June 2015. Prior to maintaining normal rhythm was unclear if what  component of her dyspnea was secondary to her atrial fibrillation versus her moderate tricuspid, aortic and mitral valve disease as well as underlying pulmonary disease. However, since maintaining normal rhythm she had a significant improvement of symptoms.  Additionally she had an echocardiogram since I last saw her which showed overall maintaining normal rhythm has caused significant  improvement of the degree of dilatation of her left atrium, right atrium and marked improvement of her valvular regurgitation  with complete resolution of her tricuspid regurgitation and only mild mitral and aortic regurgitation. She does have underlying pulmonary disease with severe DLC0 abnormalities making [place] wary of her long-term amiodarone use.    Patient describes  three weeks ago she began to notice that her heart rate was irregular but not particularly rapid. This was associated with significant worsening of her fatigue and shortness of breath. She has not had significant difficulty with bleeding. All other review  of systems are negative.  __  PAST HISTORY     Past Medical Illnesses : celiac disease, Chronic back pain, Intermittent steroid spinal injections, COPD;  Past Cardiac Illnesses : Chest pain 2007, arrythmia 2007, PFO, Atrial fibrillation-persistent, Mitral regurgitation; Infectious Diseases: Staph infection at age 48 -59, Chicken  Pox; Surgical Procedures: Tonsilectomy,Apandectomy , Knee surgery 2002; Trauma History : whip lash 1980; Cardiology Procedures-Invasive: Echo 08, Stress test 08, Cardiac Cath March 2014, Cardioversion May 2015, Cardioversion July 2015;  Cardiology Procedures-Noninvasive: Echocardiogram June 2010, CT scan 01/27/07, Treadmill Stress Test, MPI (Nuclear) Study January 2013, Echocardiogram January 2013, TEE February 2013, Echocardiogram December  2013, TEE January 2014, Echocardiogram February 2015, Holter Monitor July  2015; Cardiac Cath Results: 08/2012: EF 45%; RCA 20% stenosis in prox.; mild pulm.  htn, no significant obstructive CAD.; Left Ventricular Ejection Fraction: LVEF of 60% documented via echocardiogram on 07/28/2013   PHYSICAL EXAMINATION    Vital Signs:  Blood Pressure:  138/84 Sitting, Left arm, regular  cuff  130/82 Sitting, Right arm, regular cuff    Weight: 147.00 lbs.  Height:  68"  BMI: 22   Pulse: 73/min.    Respirations: 16/min. regular and  relaxed      Constitutional: Well developed, well nourished,  in no acute distress Skin: warm and dry to touch Head : normocephalic, normal female hair pattern Eyes: conjunctivae and lids unremarkable, EOMS intact  ENT: No pallor or cyanosis Neck: carotid pulses are full and equal bilaterally, no JVD, no bruits  Chest: Normal symmetry, clear to auscultation bilaterally, normal A-P diameter, kyphosis, No ronchi with good air movement Cardiac : Normal 1st and 2nd heart sounds without murmur or gallop, regular, 2/6 systolic blowing murmur at LLSB, irregular Abdomen: bowel sounds normoactive,  no bruits Peripheral Pulses: pulses full and equal in all extremities Extremities/Back : Normal muscle strength and tone., no edema present, kyphosis present, scoliosis present Psychiatric: normal memory  Neurological: No gross motor or sensory deficits noted, affect appropriate, oriented to time, person and place.   __  ECG:  ECG performed in clinic today shows atrial fibrillation with left  anterior fascicular block.    IMPRESSIONS:  1. Persistent atrial fibrillation with failure of Tikosyn on amiodarone maintaining normal rhythm for one year  but now with recurrent atrial fibrillation.  2. Prior moderate to severe tricuspid  regurgitation, moderate mitral regurgitation, and moderate aortic   regurgitation with significant improvement and left atrial remodeling with maintenance of normal rhythm.  3. Mild left atrial dilatation, improved from severe while maintaining  normal rhythm.  4. 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 Stacy Gardner with PFTs   performed in December 2015 and stable.  5. Hypothyroidism on Synthroid.    ASSESSMENT/PLAN:  It was a pleasure to see  the patient back in follow-up. She again has had marked improvement while maintaining normal rhythm both with symptoms as well as allowing for structural remodeling of the heart. Unfortunately she has now reverted back into atrial  fibrillation over the  last few weeks. We discussed management options. These could include more limited approach of cardioversion to see if she again will have long-term maintenance of normal rhythm. With intermittent cardioversions once every year, it would be reasonable  to pursue this strategy. This would, of course, require maintenance of long-term amiodarone therapy and we discussed concerns about amiodarone given her underlying lung disease. Other considerations include attempt at [facility], which I now believe is a more  tenable approach with added change of efficacy given her improved structure disease while maintaining normal rhythm. Patient wishes to start with a more minimalistic approach and we will plan for cardioversion in the near future and follow her clinical  course. Also of note she does have severe scoliosis which may limit the capabilities to perform pulmonary vein isolation. I will plan to see the patient in two months' time following her [place] cardioversion.     Jarvis Morgan Amado Coe, MD    ASF/ds     cc: Danford Bad MD    ds   ____________________________  Christianne Dolin  12 Lead ECG Today  Cardioversion At [facility]

## 2020-09-24 NOTE — Progress Notes (Signed)
[facility] OFFICE  [address] [place], [place] [address]     [name], [place]    Date of Visit:  04/06/2016  Date of Birth: [birth year]  Age: 87 yrs.   Medical Record Number: [id number]  __  CURRENT DIAGNOSES     1. Mitral valve insufficiency nonrheumatic,  I34.0  2. Unspecified atrial fibrillation, I48.91  3. CHF acute on chronic diastolic, I50.33  4. Shortness of breath, R06.02  5. Long term (current) use of anticoagulants, Z79.01  __   ALLERGIES    Erythromycin Base, Rash  __  MEDICATIONS      1. Fosamax 70 mg tablet, 1 po qweek  2. Align 4 mg capsule, Take as Directed  3. gabapentin 100 mg capsule, 1 po tid  4. magnesium 250 mg tablet, 1 po qd  5. Tylenol Extra Strength 500 mg tablet, 1 po tid  6. multivitamin  capsule, 1 po qd  7. lisinopril 5 mg tablet, 1 po qd  8. Synthroid 112 mcg tablet, 1 po qd  9. Vitron-C 65 mg iron-125 mg tablet,delayed release, 2 x per week  10. Citracal + D Slow Release 600 mg calcium-500 unit tablet,ext.release, 2  po qd  11. simvastatin 10 mg tablet, 1 po qhs  12. CoQ-10 100 mg capsule, 250 mg per pt. 1 po qd  13. warfarin 5 mg tablet, 1 to 1.5 po qd or as directed by [facility]  14. fluticasone 100 mcg/actuation blister powder for inhalation, Take  as Directed  15. Lasix 20 mg tablet, 1 po qd  16. Tikosyn 250 mcg capsule, 1 PO BID  __  CHIEF COMPLAINT/REASON FOR VISIT  Followup of  CHF acute on chronic diastolic, Followup of Long term (current) use of anticoagulants, Followup of Mitral valve insufficiency nonrheumatic, Followup of Shortness of breath and Followup of Unspecified atrial fibrillation  __   HISTORY OF PRESENT ILLNESS  [name] is an 87 year old female who returns to the office today for followup. She underwent a TEE-guided cardioversion on March 06, 2016. Shortly following that  procedure, she was seen by Dr. Tery Sanfilippo and noted to have mild volume overload. She had her Lasix increased to 40 mg daily for several days with improvement in her  symptoms. She has been on Lasix 20 mg for the past few weeks and reports to be feeling well.  Her shortness of breath has significantly improved. She denies any chest pain, pressure, tightness, dizziness, presyncope or syncope. She continues on Coumadin without any bleeding issues.  __   PAST HISTORY     Past Medical Illnesses: celiac disease, Chronic back pain, Intermittent steroid spinal  injections, COPD;  Past Cardiac Illnesses: Chest pain 2007, arrythmia 2007, PFO, Atrial fibrillation-persistent,  Mitral regurgitation; Infectious Diseases: Staph infection at age 34 -89, Chicken Pox; Surgical Procedures : Tonsilectomy,Apandectomy , Knee surgery 2002, total R knee replacement; Trauma History: whip lash 1980;  Cardiology Procedures-Invasive: Echo 08, Stress test 08, Cardiac Cath March 2014, Cardioversion May 2015, Cardioversion July 2015, Cardioversion for Afib August 2016;  Cardiology Procedures-Noninvasive: Echocardiogram June 2010, CT scan 01/27/07, Treadmill Stress Test, MPI (Nuclear) Study January 2013, Echocardiogram January 2013, TEE February 2013, Echocardiogram December  2013, TEE January 2014, Echocardiogram February 2015, Holter Monitor July 2015; Cardiac Cath Results: 08/2012: EF 45%; RCA 20% stenosis in prox.; mild pulm.  htn, no significant obstructive CAD.; Left Ventricular Ejection Fraction: LVEF of 50% documented via echocardiogram on 03/06/2016   PHYSICAL EXAMINATION    Vital Signs:  Blood Pressure:  150/70 Sitting, Left arm, regular cuff  142/70 Sitting, Right arm, regular cuff    Weight: 137.00 lbs.   Height: 62"  BMI: 25   Pulse:  70/min.       Constitutional: Well developed, well nourished, in no acute distress  Skin: warm and dry to touch Head: normocephalic, normal  female hair pattern Eyes: conjunctivae and lids unremarkable, EOMS intact ENT : No pallor or cyanosis Neck: carotid pulses are full  and equal bilaterally, no JVD, no bruits Chest: Normal symmetry, clear to auscultation  bilaterally, no use of accessory muscles, normal respiratory effort  Cardiac: RRR no m/r/g Abdomen: bowel sounds normoactive, no bruits  Peripheral Pulses: pulses full and equal in all extremities Extremities/Back : trace bilateral ankle edema Psychiatric: normal memory  Neurological: oriented to time, person and place, affect appropriate   __    Medications added  today by the physician:      IMPRESSIONS:   1. Mild LV dysfunction, likely tachy-mediated. EF  45-50% on TEE March 06, 2016.   2. Persistent atrial fibrillation status post ablation followed by cardioversion on March 05, 2016,  patient maintaining sinus rhythm currently. Anticoagulated with Coumadin. She denies any  bleeding  issues.   3. Moderate to severe mitral valve regurgitation with biatrial enlargement, moderate-to-severe  tricuspid regurgitation and low normal EF.  4. 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 Sheliah Mends.   5. Normal coronary arteries by cardiac catheterization in 2014.  6. History of celiac disease.  7. Chronic anemia.  8. Hematoma right thigh following ablation, improving.    RECOMMENDATIONS:    1. Continue Lasix 20 mg daily.   2. Repeat echocardiogram tomorrow as scheduled to reassess LV function.   3. Follow-up with Dr. Amado Coe January 9 as scheduled.   4. Follow-up with Dr. Talmadge Chad in three months as scheduled.      [name]. Teresa Stagner, NP-C     Tid: [id number]    cc: Teresa Bad MD    eca   ____________________________   [name]  Return Visit 15 MIN 3 months

## 2020-09-24 NOTE — Progress Notes (Signed)
[facility] OFFICE  [address] [place], [place] [address]     [name], [place]    Date of Visit:  08/24/2018  Date of Birth: 1932/03/18  Age: 87 yrs.   Medical Record Number: [id number]  __  CURRENT DIAGNOSES     1. Mitral valve insufficiency nonrheumatic,  I34.0  2. 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 Atrial Fibrillation, Paroxysmal, I48.0  4. Arrhythmia-PVCs symptomatic, I49.3  5. CHF chronic systolic, I50.22  6. Atrial Septal Defect, Q21.1  7. Shortness of breath, R06.02  8.  Device check automatic implantable cardiac defibrillator, Z45.02  9. Long term (current) use of anticoagulants, Z79.01  10. Cardiac arrest, history of, Z86.74  11. Status post ICD, Z95.810  12. Status post atrial septal defect repair,  Z98.890  13. Carotid Bruit, R09.89  14. Poisoning By Loop Diuretics, Accidental, Subs, T50.1X1D  __  ALLERGIES     Erythromycin Base, Rash  Tikosyn, Intolerance-unknown  __  MEDICATIONS     1. Align 4 mg  capsule, Take as Directed  2. gabapentin 100 mg capsule, 1 po tid  3. Tylenol Extra Strength 500 mg tablet, 1 po tid  4. multivitamin capsule, 1 po qd  5. Citracal + D Slow Release 600 mg calcium-500 unit tablet,ext.release, 2 po qd   6. simvastatin 10 mg tablet, 1 po qhs  7. fluticasone 100 mcg/actuation blister powder for inhalation, Take as Directed  8. coenzyme Q10 100 mg capsule, 1 po qd  9. magnesium 200 mg tablet, 1 po qd  10. Xarelto 15 mg tablet, 1 po qd   11. potassium chloride ER 10 mEq capsule,extended release, 1 po qd  12. Lasix 40 mg tablet, 1/2 tab qd  13. Vitron-C 65 mg iron-125 mg tablet,delayed release, 2 x per week PRN  14. carvedilol 12.5 mg tablet, 1 po bid  15. Amiodarone 200  Mg Tablet, 1 po daily  16. spironolactone 25 mg tablet, 1/2 tab po qd  17. Losartan 25 Mg Tablet, 1 po qd  18. Synthroid 112 mcg tablet, 1 po qd  __  CHIEF COMPLAINT/REASON FOR VISIT   Followup of CHF chronic systolic, Followup of Mitral valve insufficiency nonrheumatic and Followup of Shortness of  breath  __  HISTORY OF PRESENT ILLNESS   [name] comes in for routine clinical followup. She continues to have occasional dyspnea, which we now feel is more related to musculoskeletal constraints. She denies PND, orthopnea or worsening pedal edema. She thinks that she does have a little bit  of pedal edema when her diuretic dose was decreased and would like to take half tablet alternating with one tablet every other day. Otherwise, overall, she is doing quite well.  __  PAST HISTORY      Past Medical Illnesses: celiac disease, Chronic back pain, Intermittent steroid spinal injections, COPD, hypothyroid, pure hypercholesterolemia, kyphoscoliosis;   Past Cardiac Illnesses: CHF chronic systolic, Chest pain, arrythmia-PVCs symptomatic, PFO, Atrial fibrillation-persistent, Mitral regurgitation, Cardiomyopathy,  HFwREF, VT, atrial septal defect, ICD; Infectious Diseases: Staph infection at age 75 -85, Chicken Pox;  Surgical Procedures: Tonsilectomy,Apandectomy , Knee surgery 2002, total R knee replacement, ASD Repair; Trauma History : whip lash 1980; Cardiology Procedures-Invasive: Echo 08, Stress test 08, Cardiac Cath March 2014, Cardioversion May 2015, Cardioversion July 2015, Cardioversion  for Afib August 2016, Cardiac Cath (left and right) October 2017, St Jude dual chamber ICD 05/25/16, Cardiac Cath (right) May 2018, ASD closure 11/11/16, ASD closure 11/11/16, Pacemaker implant January 2019;  Cardiology Procedures-Noninvasive: CT scan 01/27/07,  Treadmill Stress Test, MPI (Nuclear) Study January 2013, TEE 07/2011, 06/2012, Holter Monitor July 2015, Echocardiogram 11/2008, 06/2011, 05/2012, 07/2013, 04/2016,  10/2016, 03/2017, Echocardiogram April 2019; Cardiac Cath Results: no significant obstructive CAD, mild pulm HTN, R to L shunt with Qp:Qs 1.6, LVEDP ;  Left Ventricular Ejection Fraction: LVEF of 50% documented via echocardiogram on 09/29/2017; Peripheral Vascular Procedures : Carotid NIVA September 2019;  Peripheral Vasc Procedure Results: 02/2018- R carotid 50% stenosis, L 50-69% stenosis, repeat 44yr.   SOCIAL HISTORY    Alcohol Use: Does not use alcohol;  Smoking: Does not smoke; Never smoker ([id number]); Diet: Regular diet and Caffeine use-1-2 per day;  Lifestyle: Married, 2 sons - one in [place] and second in [place]; Exercise: Exercises occasionally;   __   PHYSICAL EXAMINATION    Vital Signs:  Blood Pressure:  128/72 Sitting, Left arm, regular  cuff  124/76 Sitting, Right arm, regular cuff    Weight: 139.00 lbs.  Height:  64.0"  BMI: 23.86   Pulse: 64/min. Apical  Regular       Constitutional: Well developed, chronically ill appearing, in no acute distress, walking with cane  Skin: surgical scars well-healed, warm and dry to touch, erythematous fingertips Head: normocephalic,  normal female hair pattern Eyes: pale conjunctivae ENT : No pallor or cyanosis Neck: JVP normal, no carotid bruit, thyroid not enlarged Chest : Normal symmetry, kyphosis, clear to auscultation bilaterally Cardiac: Regular rhythm, S1 normal, S2 normal, No S3 or S4, soft mid systolic murmur grade  2/6 heard best at the apex Abdomen: bowel sounds normoactive Peripheral Pulses : bilateral radial pulse(s) 1+ Extremities/Back: 1+ bilateral pretibial edema, kyphosis present Psychiatric : mild forgetfulness Neurological: oriented to time, person and place, affect appropriate   __    Medications added today by the physician:   Losartan 25 Mg Tablet, 1 po qd, 90      IMPRESSIONS:   1. Chronic dyspnea on exertion, clinically stable. Contribution from musculoskeletal constraint   as well as MR and chronic systolic CHF.Marland Kitchen  2. Chronic systolic heart failure  with LVEF up to 50% after ICD upgrade. Prior ejection fraction   on TEE June 16, 2017, was 22%.  3. Mild renal insufficiency with mild anemia.   4. Moderate mitral regurgitation with moderate pulmonary hypertension, improved to mild mitral    regurgitation and pulmonary hypertension on last echo,  09/29/17.  5. Paroxysmal atrial fibrillation, previously persistent but in sinus rhythm after cardioversion   during biventricular ICD upgrade June 28, 2017. Prior failure of amiodarone with  subsequent   extensive left and right atrial ablation. Focal impulse and rotor modulation-guided bitemporal   dispersion ablation September 2017.  6. Xarelto initiated for anticoagulation. Current creatinine clearance is 24 mL per minute (reduced    dose for creatinine clearance 15-50 mL per minute).  7. Atrial septal defect, status post closure Nov 11, 2016. She completed three months of Plavix.  8. Severe cardiomyopathy with left bundle branch block and heart failure resulting in biventricular   ICD upgrade June 28, 2017. Last interrogation remotely 06/16/18.  9. Ventricular fibrillation arrest in the setting of new cardiomyopathy while on Tikosyn, status post   original ICD implant December 2017.  10. Cardiac catheterization showing  no significant coronary disease, Nov 11, 2017.  11. Kyphoscoliosis with moderate restriction. Severe DLCO abnormality followed by Dr. Stacy Gardner.  12. Hypothyroidism.  13. Intolerance of ACE inhibitor previously due to hypotension.     RECOMMENDATION:   Return clinical followup in four  months and recheck echocardiogram ahead at that visit for followup of her mitral valve disease and reassess left ventricular function.    Encarnacion Slates, MD     Tid: [id number]:EX     cc: Danford Bad MD  Bryon Lions MD  ____________________________  TODAYS ORDERS  2D, color flow, doppler 4 months  Return Visit 15 MIN 4 months

## 2020-09-24 NOTE — Progress Notes (Signed)
Young, Teresa    Date of Visit:  03/05/2016  Date of Birth: [birth year]  Age: 87 yrs.   Medical Record Number: [id number]  __  CURRENT DIAGNOSES     1. Unspecified atrial fibrillation,  I48.91  2. Long term (current) use of anticoagulants, Z79.01  3. Mitral valve insufficiency nonrheumatic, I34.0  4. Shortness of breath, R06.02  __  ALLERGIES     Erythromycin Base, Rash  __  MEDICATIONS     1. Fosamax 70 mg tablet, 1 po qweek  2. Align  4 mg capsule, Take as Directed  3. gabapentin 100 mg capsule, 1 po tid  4. magnesium 250 mg tablet, 1 po qd  5. Tylenol Extra Strength 500 mg tablet, 1 po tid  6. multivitamin capsule, 1 po qd  7. lisinopril 5 mg tablet, 1 po qd   8. Synthroid 112 mcg tablet, 1 po qd  9. Vitron-C 65 mg iron-125 mg tablet,delayed release, 2 x per week  10. Lasix 20 mg tablet, 1 po qod  11. Citracal + D Slow Release 600 mg calcium-500 unit tablet,ext.release, 2 po qd  12. simvastatin  10 mg tablet, 1 po qhs  13. CoQ-10 100 mg capsule, 250 mg per pt. 1 po qd  14. warfarin 5 mg tablet, 1 to 1.5 po qd or as directed by [facility]  15. Lovenox 60 mg/0.6 mL subcutaneous syringe, 1 SQ BID while INR less than 2    __   HISTORY OF PRESENT ILLNESS  [name] comes in today for a problem visit after a recent extensive ablation for atrial fibrillation with a PVI, bilateral FIRM and temporal diversion as well as a right  isthmus ablation February 26, 2016. She is an 87 year old with a history of persistent atrial fibrillation with failure of Tikosyn and amiodarone. She was restarted on Tikosyn post-ablation and had runs of atrial arrhythmias here and there, but overall  was discharged in normal rhythm. Her INR was 3.5 and so Coumadin was held Friday night and she was supposed to restart it Saturday, but there were problems with EPIC at the time and she states she never got a list of instructions. She therefore did not  start Coumadin again until Monday, in which her INR was 1.1 on Monday and is 1.6  today. Her dosing was increased to 7.5 mg for the last two nights from 5 mg a day. She takes 7.5 mg once a week. She had taken 7.5 mg once a week prior to surgery. She was  discharged on Saturday and then on Sunday she believes she went back into atrial fibrillation with elevated heart rates and has been noting shortness of breath and fatigue. She denies any lightheadedness, dizziness, weakness in her extremities, slurred  speech or syncope.  __  PAST HISTORY     Past Medical Illnesses : celiac disease, Chronic back pain, Intermittent steroid spinal injections, COPD;  Past Cardiac Illnesses : Chest pain 2007, arrythmia 2007, PFO, Atrial fibrillation-persistent, Mitral regurgitation; Infectious Diseases: Staph infection at age 27 -59, Chicken  Pox; Surgical Procedures: Tonsilectomy,Apandectomy , Knee surgery 2002, total R knee replacement; Trauma  History: whip lash 1980; Cardiology Procedures-Invasive: Echo 08, Stress test 08, Cardiac Cath March 2014,  Cardioversion May 2015, Cardioversion July 2015, Cardioversion for Afib August 2016; Cardiology Procedures-Noninvasive: Echocardiogram June 2010, CT scan  01/27/07, Treadmill Stress Test, MPI (Nuclear) Study January 2013, Echocardiogram January 2013, TEE February 2013, Echocardiogram December 2013, TEE January 2014, Echocardiogram February 2015, Holter Monitor July 2015;  Cardiac Cath Results: 08/2012: EF 45%; RCA 20% stenosis in prox.; mild pulm. htn, no significant obstructive CAD.; Left Ventricular Ejection Fraction : LVEF of 60% documented via echocardiogram on 07/28/2013  ___  FAMILY HISTORY  Father --  No significant family history  Mother -- Influenza/Pneumonia (cause of death)    __  CARDIAC RISK FACTORS      Tobacco Abuse: has never used tobacco; Family History of Heart Disease: positive;  Hyperlipidemia: positive; Hypertension: positive;   Diabetes Mellitus: negative; Prior History of Heart Disease: negative;  Obesity: negative; Sedentary Life Style:positive;   ZOX:[id number]; Menopausal:biological menopause  __   SOCIAL HISTORY    Alcohol Use: Does not use alcohol;  Smoking: Does not smoke; Never smoker ([id number]); Diet: Regular diet and Caffeine use-1-2 per day;  Lifestyle: Married; Exercise: Exercises occasionally;   __   PHYSICAL EXAMINATION    Vital Signs:  Blood Pressure:  120/80 Sitting, Left arm, regular  cuff  120/78 Sitting, Right arm, regular cuff    Weight: 144.00 lbs.  Height:  62"  BMI: 26   Pulse: 117/min. Apical Irregularly,  irregular   Respirations: 15/min. regular and relaxed      Constitutional:  Well developed, well nourished, in no acute distress Skin: warm and dry to touch Head : normocephalic, normal female hair pattern Eyes: conjunctivae and lids unremarkable, EOMS intact  ENT: No pallor or cyanosis Neck: carotid pulses are full and equal bilaterally, no JVD, no bruits  Chest: Normal symmetry, clear to auscultation bilaterally Cardiac: irregular rrhythm, S1 normal, S2  normal, No S3 or S4, Apical impulse not displaced, no murmurs, gallops or rubs detected. Abdomen: bowel sounds normoactive, no bruits  Peripheral Pulses: pulses full and equal in all extremities Extremities/Back: Normal muscle strength  and tone., no edema present, kyphosis present, scoliosis present Psychiatric: normal memory Neurological : oriented to time, person and place, affect appropriate   __    Medications added today by the physician:  Lovenox 60 mg/0.6 mL subcutaneous  syringe, 1 SQ BID while INR less than 2, 10      ECG:   ECG today demonstrates atrial fibrillation with rapid ventricular response at 118 beats per minute. QTc interval is stable at 440 msec.    IMPRESSIONS:   1. Recurrent atrial  fibrillation status post extensive ablation and Tikosyn loading February 26, 2016.  2. History of persistent atrial fibrillation, status post PVI, biatrial FIRM-guided bitemporal  dispersion, right isthmus ablation February 26, 2016. Failed Tikosyn  and amiodarone in the  past.  3.  CHADS-VASc three (age and female gender). Continues on Coumadin with subtherapeutic  INRs post-ablation.  4. Echo on September 11, 2015, showing an EF of 55% with moderate LAE, a volume index of  37 mL/m2,  moderate MR, moderate TR, moderate pulmonary hypertension.  5. Severe DLCO abnormality with moderate obstructive pattern, followed by Dr. Stacy Gardner.  6. Scoliosis.    PLAN:   From an arrhythmia standpoint, [name] was discharged on  Saturday from the hospital and Coumadin had been held Friday night given an INR of 3.5, and she was supposed to restart it on Saturday evening; however, misunderstood and did not restart it until Monday. Her INR Monday was 1.1 and they increased her dose  to 7.5 over the last two days, and today her INR is 1.6. I have recommended that she bridge with Lovenox given her recent ablation and cardioversion to normal rhythm. I have prescribed 60 mg syringes. Her weight is 65 kg, and  she will do b.i.d. dosing.  Given her symptomatic atrial fibrillation with shortness of breath and fatigue though, ongoing since Sunday, we will plan a TEE and cardioversion for her tomorrow. She will get an INR tomorrow as well as hopefully Lovenox can be discontinued after her  INR results are above two. I have explained that while she heals up from this ablation, we would like to maintain normal rhythm and therefore we will plan a cardioversion as well as keep her on the Tikosyn in the short term at least. She will go back  to her normal dosing of Coumadin tonight as she got two extra 7.5 over the last two nights and therefore her INR should be in the therapeutic range tomorrow. We will plan a TEE and cardioversion. She is n.p.o. at midnight and continue with Tikosyn.     Loreli Slot, PA-C     Tid: [id number]:[id number]    cc: Danford Bad MD    eca

## 2020-09-24 NOTE — Progress Notes (Signed)
[facility] OFFICE  [address] [place], [place] [address]     [name], [place]    Date of Visit:  06/07/2017  Date of Birth: May 05, 1932  Age: 87 yrs.   Medical Record Number: [id number]  Referring Physician: Casimer Bilis MD, [place] J  __   CURRENT DIAGNOSES     1. Mitral valve insufficiency nonrheumatic, I34.0  2. 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 Atrial Fibrillation, Paroxysmal, I48.0  4. Atrial Fibrillation, Persistent,  I48.1  5. Arrhythmia-PVCs symptomatic, I49.3  6. CHF chronic systolic, I50.22  7. Atrial Septal Defect, Q21.1  8. Shortness of breath, R06.02  9. Device check automatic implantable cardiac defibrillator, Z45.02  10. Long term (current)  use of anticoagulants, Z79.01  11. Cardiac arrest, history of, Z86.74  12. Status post atrial septal defect repair, Z98.890  13. Poisoning By Loop Diuretics, Accidental, Subs, T50.1X1D  __   ALLERGIES    Erythromycin Base, Rash  Tikosyn, Intolerance-unknown  __   MEDICATIONS     1. Align 4 mg capsule, Take as Directed  2. amiodarone 200 mg tablet, 1 po daily  3. carvedilol 12.5 mg tablet, 1 po bid  4. Citracal + D Slow Release 600 mg calcium-500  unit tablet,ext.release, 2 po qd  5. coenzyme Q10 100 mg capsule, 1 po qd  6. fluticasone 100 mcg/actuation blister powder for inhalation, Take as Directed  7. Fosamax 70 mg tablet, 1 po qweek  8. gabapentin 100 mg capsule, 1 po tid   9. Lasix 40 mg tablet, 1 TAB po daily  10. losartan 25 mg tablet, 1/2 tab po qd, hold sbp less than 100  11. magnesium 200 mg tablet, 1 po qd  12. multivitamin capsule, 1 po qd  13. simvastatin 10 mg tablet, 1 po qhs  14. spironolactone  25 mg tablet, 1/2 tab po qd  15. Synthroid 112 mcg tablet, 1 po qd  16. Tylenol Extra Strength 500 mg tablet, 1 po tid  17. Vitron-C 65 mg iron-125 mg tablet,delayed release, 2 x per week  18. Xarelto 15 mg tablet, 1 po qd  __   CHIEF COMPLAINT/REASON FOR VISIT  Followup of Mitral valve insufficiency nonrheumatic  __  HISTORY OF PRESENT ILLNESS   Ms.  [name] is here for followup. She continues to have dyspnea on exertion, which has not really improved. She is going to be undergoing an LV lead placement by the good Dr. Amado Coe.    She underwent a transesophageal echocardiogram performed on May 27, 2017, and this was done by Dr. Johnell Comings. The patient did not have severe mitral regurgitation, with an effective regurgitant orifice of 0.18 cm2. Dr. Fidel Levy saw the degree of regurgitation was no more than moderate.   __    PHYSICAL EXAMINATION    Vital Signs:  Blood Pressure:       Constitutional: thin frail chronically ill appearing,pale, using cane Skin: surgical scars well-healed,  warm and dry to touch, no apparent skin lesions, no apparent masses noted Head: normocephalic, no tenderness,  abnormal female hair pattern Eyes: pale conjunctivae ENT : Ears, Nose and throat reveal no gross abnormalities, No pallor or cyanosis Neck:  JVD present, carotid pulses are full and equal bilaterally, no bruits Chest: clear to auscultation bilaterally, Normal symmetry, no intercostal retraction,  no use of accessory muscles Cardiac: Irregularly irregular rhythym; normal S1 and S2. Grade 2/6 SM @ [facility]  Abdomen: bowel sounds normoactive Peripheral Pulses:  bilateral radial pulse(s) 1+ Extremities/Back: 1+ bilateral pretibial edema Psychiatric :  mild forgetfulness Neurological: oriented to time,  person and place, affect appropriate   __    Medications added today by the physician:       IMPRESSIONS:   1. Progressively worse dilated cardiomyopathy with an ejection fraction that is now 22% with  chronic systolic heart failure.  2. Moderate mitral regurgitation by transesophageal  echocardiogram with an ERO of 0.18.  3. Persistent atrial fibrillation - she was not able to be cardioverted despite having been  loaded with amiodarone.  4. The patient is planned for an LV lead placement by Dr. Amado Coe.  5. CHADS-VASc score  of 4 - on Xarelto.  6. VF arrest in the setting of  cardiomyopathy while on Tikosyn - she has an ICD and this will  be upgraded to a bi-V ICD.  7. Nonobstructive coronary disease by cardiac catheterization on Nov 11, 2016.  8. Status post  closure of an iatrogenic atrial septal defect with a 13 mm Amplatzer septal  occluder performed on Nov 06, 2016.  9. Kyphoscoliosis with moderate restriction and severe reduction in DLCO, followed by  Dr. Stacy Gardner.  10. Hypothyroidism.   11. History of relative iatrogenic hypotension.    RECOMMENDATIONS AND DISCUSSION:   At this time, I do not believe the patient will benefit from  mitral valve clip procedure given the fact that she has no more than moderate regurgitation at this time. The patient will be undergoing upgrade from an ICD to a bi-V ICD, which hopefully will help her symptoms. She will continue to follow up with Dr.  Talmadge Chad, and can see me as needed per Dr. Talmadge Chad.    Massie Maroon, MD, FACC, FSCAI     Tid: [id number]:[id number]    cc:  [place] J ZIK MD  Danford Bad MD    MG

## 2020-09-24 NOTE — Progress Notes (Signed)
[facility] OFF[facility]  [address] [place], [place] [address]     [name], [place]    Date of Visit:  01/27/2017  Date of Birth: 1931/12/12  Age: 87 yrs.   Medical Record Number: [id number]  __  CURRENT DIAGNOSES     1. Mitral valve insufficiency nonrheumatic,  I34.0  2. 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 Atrial Fibrillation, Paroxysmal, I48.0  4. Arrhythmia-PVCs symptomatic, I49.3  5. CHF chronic systolic, I50.22  6. Atrial Septal Defect, Q21.1  7. Device check automatic implantable cardiac  defibrillator, Z45.02  8. Status post atrial septal defect repair, Z98.890  9. Long term (current) use of anticoagulants, Z79.01  10. Cardiac arrest, history of, Z86.74  11. Shortness of breath, R06.02  12. Unspecified atrial fibrillation,  I48.91  __  ALLERGIES    Erythromycin Base, Rash  Tikosyn, Intolerance-unknown   __  MEDICATIONS     1. Align 4 mg capsule, Take as Directed  2. carvedilol 12.5 mg tablet, 1 po bid  3. Citracal + D Slow Release 600 mg  calcium-500 unit tablet,ext.release, 2 po qd  4. clopidogrel 75 mg tablet, 1 po qd  5. fluticasone 100 mcg/actuation blister powder for inhalation, Take as Directed  6. Fosamax 70 mg tablet, 1 po qweek  7. gabapentin 100 mg capsule, 1  po tid  8. Lasix 40 mg tablet, 1 po qd  9. magnesium 250 mg tablet, 1 po qd  10. multivitamin capsule, 1 po qd  11. potassium chloride ER 10 mEq tablet,extended release, 1 po bid  12. simvastatin 10 mg tablet, 1 po qhs  13. Synthroid  112 mcg tablet, 1 po qd  14. Tylenol Extra Strength 500 mg tablet, 1 po tid  15. Vitron-C 65 mg iron-125 mg tablet,delayed release, 2 x per week  16. Xarelto 20 mg tablet, 1 po qd  __   CHIEF COMPLAINT/REASON FOR VISIT  Followup of CHF chronic systolic and Followup of Shortness of breath  __  HISTORY OF PRESENT ILLNESS   [name] returns for clinical followup. From a cardiac standpoint, she seems to be doing reasonably well, although she continues to have dyspnea on exertion. She informs me today  that she has had diarrhea for a month and is getting a GI workup.  She has also been slightly anemic, although she does not describe any obvious evidence of gastrointestinal bleeding. She is still on clopidogrel and Xarelto. We discussed discontinuation of clopidogrel at the end of August as it will have been three months  post-ASD closure.  __  PAST HISTORY     Past Medical Illnesses : celiac disease, Chronic back pain, Intermittent steroid spinal injections, COPD;  Past Cardiac Illnesses : Chest pain 2007, arrythmia 2007, PFO, Atrial fibrillation-persistent, Mitral regurgitation; Infectious Diseases: Staph infection at age 74 -73, Chicken  Pox; Surgical Procedures: Tonsilectomy,Apandectomy , Knee surgery 2002, total R knee replacement, ASD Repair;  Trauma History: whip lash 1980; Cardiology Procedures-Invasive: Echo 08, Stress test 08, Cardiac Cath March  2014, Cardioversion May 2015, Cardioversion July 2015, Cardioversion for Afib August 2016, Cardiac Cath (left and right) October 2017, St Jude dual chamber ICD 05/25/16, Cardiac Cath (right) May 2018, ASD closure 11/11/16, ASD closure 11/11/16;  Cardiology Procedures-Noninvasive: Echocardiogram June 2010, CT scan 01/27/07, Treadmill Stress Test, MPI (Nuclear) Study January 2013, Echocardiogram January 2013, TEE February 2013, Echocardiogram December  2013, TEE January 2014, Echocardiogram February 2015, Holter Monitor July 2015, Echocardiogram November 2017, Echocardiogram May 2018; Cardiac Cath Results : no significant obstructive  CAD, mild pulm HTN, R to L shunt with Qp:Qs 1.6, LVEDP ; Left Ventricular Ejection Fraction: LVEF of 20% documented  via echocardiogram on 11/12/2016  SOCIAL HISTORY    Alcohol Use : Does not use alcohol; Smoking: Does not smoke; Never smoker ([id number]); Diet : Regular diet and Caffeine use-1-2 per day; Lifestyle: Married, 2 sons - one in [place] and second in [place];  Exercise: Exercises occasionally;   __  PHYSICAL EXAMINATION     Vital  Signs:  Blood Pressure:  118/70 Sitting, Left arm, regular cuff  114/70 Sitting, Right arm,  regular cuff    Weight: 127.40 lbs.  Height: 64"   BMI: 22   Pulse: 80/min. Apical Irregular        Constitutional: Well developed, chronically ill appearing, in no acute distress Skin: warm and dry to touch  Head: normocephalic Eyes: conjunctivae and lids normal,  visual fields not performed ENT: No pallor or cyanosis  Neck: carotid pulses are full and equal bilaterally, no bruits Chest : normal respiratory effort, fine rales both bases Cardiac: Irregularly regular rhythm with variable 1st heart sound, normal 2nd heart sound, low pitched  holosystolic murmur grade 2/6 left sternal border Abdomen: bowel sounds normoactive, no bruits Peripheral  Pulses: pulses full and equal in all extremities Extremities/Back : 1+ bilateral ankle edema Psychiatric: normal memory  Neurological: oriented to time, person and place, affect appropriate   __    Medications added  today by the physician:  clopidogrel 75 mg tablet, 1 po qd, 1      IMPRESSIONS:   1. Chronic  dyspnea on exertion, multifactorial.  2. Chronic systolic congestive heart failure with initial improvement after ASD closure. Metolazone   discontinued November 23, 2016. The patient still has dyspnea on exertion.  3. Atrial septal defect closure,  Nov 11, 2016, for transseptal ASD with left to right shunt.   Planned Plavix for three months.  4. Gastrointestinal bleeding resulting in hospitalization, December 04, 2016, to December 11, 2016.   Transiently off Plavix, but back on clopidogrel at  present.  5. Nonischemic cardiomyopathy with EF 25%, November 2017, and 20%-25% after previous   improvement, Nov 12, 2016.  6. Recurrent atrial fibrillation, status post failure of amiodarone and extensive left and right atrial   focus  impulsive rotor ablation with pulmonary vein isolation, September 2017. Chronic Xarelto   for anticoagulation.  7. Out-of-hospital cardiac arrest, December 2017, on  Tikosyn and also with progressive decline in l  eft ventricular function,  status post ICD, December 2017.  8. Kyphoscoliosis with moderate restriction. Severe DLCO abnormality, followed by Dr. Stacy Gardner.  9. Hypothyroidism.  10. Intolerance to ACE inhibitor due to hypotension.     RECOMMENDATIONS:   1. The patient is going to continue on her current medical regimen as she seems to be doing   reasonably well from a cardiac standpoint. She clearly needs GI workup and nutritional    supplementation.  2. Return clinical followup in about six weeks.    Encarnacion Slates, MD     Tid: [id number]    cc: Danford Bad MD  Alvino Chapel Micki Riley MD    LK   ____________________________   Christianne Dolin  Return Visit 15 MIN 3 months

## 2020-09-24 NOTE — Progress Notes (Signed)
[name], [name]    Date of Visit:  02/06/2015  Date of Birth: Feb 12, 1932  Age: 87 yrs.   Medical Record Number: [id number]  __  CURRENT DIAGNOSES     1. Mitral valve insufficiency nonrheumatic,  I34.0  2. Unspecified atrial fibrillation, I48.91  3. Shortness of breath, R06.02  4. Long term (current) use of anticoagulants, Z79.01  __  ALLERGIES     Erythromycin Base, Rash  __  MEDICATIONS     1. Fosamax 70 mg tablet, 1 po qweek  2. Align  4 mg capsule, Take as Directed  3. gabapentin 100 mg capsule, 1 po tid  4. simvastatin 20 mg tablet, 1 po qd  5. magnesium 250 mg tablet, 1 po qd  6. Tylenol Extra Strength 500 mg tablet, 1 po tid  7. multivitamin capsule, 1 po qd   8. Claritin 10 mg tablet, 1 po qd  9. lisinopril 5 mg tablet, 1 po qd  10. warfarin 5 mg tablet, 1 po qd or as directed by [facility]  11. Synthroid 112 mcg tablet, 1 po qd  12. Vitron-C 65 mg iron-125 mg tablet,delayed release, 2 x per  week  13. Lasix 20 mg tablet, 1 po qod  14. amiodarone 200 mg tablet, 1 po qd  15. Citracal + D Slow Release 600 mg calcium-500 unit tablet,ext.release, 2 po qd  16. CoQ-10 100 mg capsule, 2 po qd  __   CHIEF COMPLAINT/REASON FOR VISIT  Followup of Mitral valve insufficiency nonrheumatic and Followup of Unspecified atrial fibrillation  __  HISTORY  OF PRESENT ILLNESS  Mrs. Ezra comes in for clinical followup. Fortunately, she remains in sinus rhythm after her recent repeat electrical cardioversion. I agree completely with every effort to maintain  sinus rhythm given the fact that she had such improvement in her degree of MR as well as underlying chamber sizes by echocardiography. She does not specifically describe any change in her dyspnea and has completed pulmonary rehab. She has been under a  lot of stress related to her husband's sibling's death as well as other family members going through situational stressors. She is trying to keep track of everything herself. They had to give their car to a family  member and then purchase another one  as well as get a new computer setup. Fortunately, her son was helpful with some of this, but both children live out of state.   __  PAST HISTORY      Past Medical Illnesses: celiac disease, Chronic back pain, Intermittent steroid spinal injections, COPD;   Past Cardiac Illnesses: Chest pain 2007, arrythmia 2007, PFO, Atrial fibrillation-persistent, Mitral regurgitation; Infectious Diseases : Staph infection at age 70 -54, Chicken Pox; Surgical Procedures: Tonsilectomy,Apandectomy , Knee surgery 2002;  Trauma History: whip lash 1980; Cardiology Procedures-Invasive: Echo 08, Stress test 08, Cardiac Cath March  2014, Cardioversion May 2015, Cardioversion July 2015; Cardiology Procedures-Noninvasive: Echocardiogram June 2010, CT scan 01/27/07, Treadmill Stress Test,  MPI (Nuclear) Study January 2013, Echocardiogram January 2013, TEE February 2013, Echocardiogram December 2013, TEE January 2014, Echocardiogram February 2015, Holter Monitor July 2015; Cardiac Cath  Results: 08/2012: EF 45%; RCA 20% stenosis in prox.; mild pulm. htn, no significant obstructive CAD.; Left Ventricular Ejection Fraction : LVEF of 60% documented via echocardiogram on 07/28/2013  ___  FAMILY HISTORY   Father -- No significant family history  Mother -- Influenza/Pneumonia (cause of death)     SOCIAL HISTORY    Alcohol Use: Does not use alcohol;  Smoking:  Does not smoke; Never smoker ([id number]); Diet: Regular diet and Caffeine use-1-2 per day;  Lifestyle: Married; Exercise: Exercises occasionally;   __   PHYSICAL EXAMINATION    Vital Signs:  Blood Pressure:   150/70 Sitting, Left arm, regular cuff  154/70 Sitting, Right arm, regular cuff    Weight: 146.00 lbs.   Height: 65"  BMI: 24   Pulse:  60/min.   Respirations: 14/min.       Constitutional:  Well developed, well nourished, in no acute distress Skin: warm and dry to touch Head : normocephalic, normal female hair pattern Eyes: conjunctivae and lids  unremarkable, EOMS intact  ENT: No pallor or cyanosis Neck:  carotid pulses are full and equal bilaterally, no JVD, no bruits Chest: Normal symmetry, clear to auscultation bilaterally, normal A-P diameter, kyphosis,  No ronchi with good air movement Cardiac: Regular rhythm, S1 normal, S2 normal, No S3 or S4, Apical impulse not displaced, no murmurs, gallops or rubs  detected. Abdomen: bowel sounds normoactive, no bruits Peripheral Pulses : pulses full and equal in all extremities Extremities/Back: Normal muscle strength and tone., no edema  present, kyphosis present, scoliosis present Psychiatric: normal memory  Neurological: oriented to time, person and place, affect appropriate   __    Medications added  today by the physician:      IMPRESSIONS:  1. Multifactorial dyspnea improved after restoration  of sinus rhythm, but with some residual  difficulty due to moderate COPD as well as restriction.   2. Paroxysmal atrial fibrillation. Failed Tikosyn and now in sinus rhythm on amiodarone after  repeat electrical cardioversion January 28, 2015,  followed by Dr. Amado Coe.  3. Moderate to severe MR with biatrial enlargement and moderate to severe TR on echo February  2015, markedly improved after restoration of sinus rhythm on last echo July 27, 2014, to  mild degree of regurgitation.    4. Severe DL CO abnormality with moderate obstructive disease, followed by Dr. Stacy Gardner.  5. Small right pleural effusion likely developing while she was in recurrent atrial fibrillation, resolved  clinically.  6. Transesophageal echo 2014:  Etiology of MR was annual dilatation.  7. Cardiac catheterization September 08, 2012: Normal coronary arteries with relatively compensated  hemodynamics and only moderate MR.  8. Kyphoscoliosis with some component of restriction in activity due  to back pain and knee pain.   9. Right-sided neck fullness with remote negative CT of the neck in 2008.  10. Normal CT of the abdomen and pelvis in 2009 with history  of celiac disease.  11. Anemia followed by Dr. Pecola Leisure.  12. Hypothyroidism  on Synthroid.    RECOMMENDATIONS:  1. The patient is going to continue on her current medical regimen as she seems to be doing  reasonably  well.   2. She has clinical followup set up with Dr. Amado Coe for management of atrial fibrillation in October.   3. She is going to followup for general cardiology in six months and we might consider repeat  echocardiography at that time.     Encarnacion Slates, MD     Tid: [id number]    cc: Danford Bad MD  Bryon Lions MD     ECA   ____________________________  Christianne Dolin  Return Visit 15 MIN 6 months  PT/INR 3 weeks

## 2020-09-24 NOTE — Progress Notes (Signed)
[facility] OFFICE  [address] [place], [place] [address]     [name], [place]    Date of Visit:  10/25/2017  Date of Birth: April 14, 1932  Age: 87 yrs.   Medical Record Number: [id number]  __  CURRENT DIAGNOSES     1. Mitral valve insufficiency nonrheumatic,  I34.0  2. 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 Atrial Fibrillation, Paroxysmal, I48.0  4. Atrial Fibrillation, Persistent, I48.1  5. Arrhythmia-PVCs symptomatic, I49.3  6. CHF chronic systolic, I50.22  7. Atrial Septal Defect, Q21.1   8. Shortness of breath, R06.02  9. Device check automatic implantable cardiac defibrillator, Z45.02  10. Long term (current) use of anticoagulants, Z79.01  11. Cardiac arrest, history of, Z86.74  12. Status post ICD, Z95.810  13. Status  post atrial septal defect repair, Z98.890  14. Poisoning By Loop Diuretics, Accidental, Subs, T50.1X1D  __  ALLERGIES     Erythromycin Base, Rash  Tikosyn, Intolerance-unknown  __  MEDICATIONS     1. Align 4 mg  capsule, Take as Directed  2. amiodarone 200 mg tablet, 1 po daily  3. carvedilol 12.5 mg tablet, 1 po bid  4. Citracal + D Slow Release 600 mg calcium-500 unit tablet,ext.release, 2 po qd  5. coenzyme Q10 100 mg capsule, 1 po qd   6. fluticasone 100 mcg/actuation blister powder for inhalation, Take as Directed  7. Fosamax 70 mg tablet, 1 po qweek  8. gabapentin 100 mg capsule, 1 po tid  9. Lasix 40 mg tablet, 1 TAB po daily  10. losartan 25 mg tablet, 1 po qd   11. magnesium 200 mg tablet, 1 po qd  12. multivitamin capsule, 1 po qd  13. simvastatin 10 mg tablet, 1 po qhs  14. spironolactone 25 mg tablet, 1/2 tab po qd  15. Synthroid 112 mcg tablet, 1 po qd  16. Tylenol Extra Strength 500  mg tablet, 1 po tid  17. Vitron-C 65 mg iron-125 mg tablet,delayed release, 2 x per week  18. Xarelto 15 mg tablet, 1 po qd  __  CHIEF COMPLAINT/REASON FOR VISIT   Followup of Atrial Fibrillation, Paroxysmal, Followup of CHF chronic systolic and Followup of Mitral valve insufficiency  nonrheumatic  __  HISTORY OF PRESENT ILLNESS   [name] comes in for clinical followup and is doing extremely well, all things considered. Her last echo, September 29, 2017, showed improvement in left ventricular function up to 50% and reduction in her mitral valve regurgitation and pulmonary hypertension.  This occurred after her ICD upgrade. She is clinically compensated with regards to heart failure at present and on a good regimen. She was unable to take ACE inhibitors previously due to fluctuating renal function and hypotension.  __   PAST HISTORY     Past Medical Illnesses: celiac disease, Chronic back pain, Intermittent steroid spinal  injections, COPD, hypothyroid;  Past Cardiac Illnesses: Chest pain 2007, arrythmia 2007, PFO, Atrial  fibrillation-persistent, Mitral regurgitation, Cardiomyopathy, HFwREF, VT; Infectious Diseases: Staph infection at age 89 -35, Chicken Pox;  Surgical Procedures: Tonsilectomy,Apandectomy , Knee surgery 2002, total R knee replacement, ASD Repair; Trauma History : whip lash 1980; Cardiology Procedures-Invasive: Echo 08, Stress test 08, Cardiac Cath March 2014, Cardioversion May 2015, Cardioversion July 2015, Cardioversion  for Afib August 2016, Cardiac Cath (left and right) October 2017, St Jude dual chamber ICD 05/25/16, Cardiac Cath (right) May 2018, ASD closure 11/11/16, ASD closure 11/11/16, Pacemaker implant January 2019;  Cardiology Procedures-Noninvasive: CT scan 01/27/07, Treadmill Stress Test, MPI (Nuclear)  Study January 2013, TEE 07/2011, 06/2012, Holter Monitor July 2015, Echocardiogram 11/2008, 06/2011, 05/2012, 07/2013, 04/2016,  10/2016, 03/2017, Echocardiogram April 2019; Cardiac Cath Results: no significant obstructive CAD, mild pulm HTN, R to L shunt with Qp:Qs 1.6, LVEDP ;  Left Ventricular Ejection Fraction: LVEF of 50% documented via echocardiogram on 09/29/2017  SOCIAL HISTORY     Alcohol Use: Does not use alcohol; Smoking: Does not smoke; Never smoker ([id number]);   Diet: Regular diet and Caffeine use-1-2 per day; Lifestyle: Married, 2 sons - one in [place] and second in [place];  Exercise: Exercises occasionally;   __  PHYSICAL EXAMINATION     Vital Signs:  Blood Pressure:  124/74 Sitting, Right arm, large cuff  128/78 Sitting, Left arm,  large cuff    Weight: 128.60 lbs.  Height: 64"   BMI: 22   Pulse: 66/min. Apical Regular        Constitutional: Well developed, chronically ill appearing, in no acute distress, walking with cane Skin:  surgical scars well-healed, warm and dry to touch, erythematous fingertips Head: normocephalic, normal  female hair pattern Eyes: pale conjunctivae ENT:  No pallor or cyanosis Neck: JVP normal, no carotid bruit, thyroid not enlarged  Chest: Normal symmetry, kyphosis, clear to auscultation bilaterally Cardiac: Regular rhythm, S1 normal,  S2 normal, No S3 or S4, soft mid systolic murmur grade 2/6 heard best at the apex Abdomen: bowel sounds normoactive  Peripheral Pulses: bilateral radial pulse(s) 1+ Extremities/Back : 1+ bilateral pretibial edema Psychiatric: mild forgetfulness  Neurological: oriented to time, person and place, affect appropriate   __    Medications added  today by the physician:  Xarelto 15 mg tablet, 1 po qd, 90      IMPRESSIONS:   1. Class II  chronic systolic heart failure with left ventricular ejection fraction up to 50% after  ICD upgrade. Prior ejection fraction on TEE, June 16, 2017, was 22%.  2. Moderate mitral regurgitation with moderate pulmonary hypertension, now improved  to mild  mitral regurgitation with mild pulmonary hypertension.  3. Atrial septal defect, status post closure, Nov 11, 2016. She completed three months of  Plavix.  4. Paroxysmal atrial fibrillation, previously persistent, now in sinus  rhythm after cardioversion  during biventricular ICD upgrade. Prior failure of amiodarone with subsequent extensive left  and right atrial ablation. Focal impulse and rotor modulation-guided bitemporal  dispersion  ablation, September 2017.   5. Severe cardiomyopathy with left bundle branch block and heart failure resulting in biventricular  ICD upgrade, June 28, 2017. Last interrogation, May 04, 2017.  6. CHADS-VASc score of four, on 15 mg of Xarelto due to creatinine clearance  of 30 mL/min.  7. Ventricular fibrillation arrest in the setting of new cardiomyopathy while on Tikosyn, status  post original ICD implant, December 2017.  8. Cardiac catheterization showing no significant coronary disease, Nov 11, 2016.   9. Kyphoscoliosis with moderate restriction. Severe DLCO abnormality, followed with Dr. Stacy Gardner.  10. Hypothyroidism.  11. Intolerance of ACE inhibitor previously due to hypotension.    RECOMMENDATIONS:    1. Continue current medical therapy as the patient seems to be very well compensated.  2. Return for clinical followup at three months at which time we might rechallenge with low-dose  ACE inhibitor if blood pressure remains stable.     Encarnacion Slates, MD     Tid: [id number]    cc: Danford Bad MD    mg  ____________________________  Christianne Dolin   Return Visit 15 MIN  3 months

## 2020-09-24 NOTE — Progress Notes (Signed)
[facility]  [address] [place], [place] [address]     [name], [place]    Date of Visit:  04/18/2015  Date of Birth: April 22, 1932  Age: 87 yrs.   Medical Record Number: [id number]  __  CURRENT DIAGNOSES     1. Mitral valve insufficiency nonrheumatic,  I34.0  2. Unspecified atrial fibrillation, I48.91  3. Shortness of breath, R06.02  4. Long term (current) use of anticoagulants, Z79.01  __  ALLERGIES     Erythromycin Base, Rash  __  MEDICATIONS     1. Fosamax 70 mg tablet, 1 po qweek  2. Align  4 mg capsule, Take as Directed  3. gabapentin 100 mg capsule, 1 po tid  4. simvastatin 20 mg tablet, 1 po qd  5. magnesium 250 mg tablet, 1 po qd  6. Tylenol Extra Strength 500 mg tablet, 1 po tid  7. multivitamin capsule, 1 po qd   8. Claritin 10 mg tablet, 1 po qd  9. lisinopril 5 mg tablet, 1 po qd  10. Synthroid 112 mcg tablet, 1 po qd  11. Vitron-C 65 mg iron-125 mg tablet,delayed release, 2 x per week  12. Lasix 20 mg tablet, 1 po qod  13. amiodarone 200  mg tablet, 1 po qd  14. Citracal + D Slow Release 600 mg calcium-500 unit tablet,ext.release, 2 po qd  15. CoQ-10 100 mg capsule, 2 po qd  16. Warfarin 5 Mg Tablet, 1 po qd or as directed by [facility]  __   CHIEF COMPLAINT/REASON FOR VISIT  Followup of s/p cardioversion 01/28/15  __  HISTORY OF PRESENT ILLNESS   The patient is an 87 year old female with a history of persistent atrial fibrillation, hypothyroidism, celiac disease, and severe valvular disease. She has failed Tikosyn therapy and has been on amiodarone therapy since June of 2015. Prior to maintaining  normal rhythm it was unclear of what component of her dyspnea was secondary to her atrial fibrillation versus her moderate tricuspid regurgitation, aortic and mitral valve disease with underlying pulmonary disease. However, maintaining normal rhythm she  had significant improvement of symptomatology. She had done well over the year with improvement of her structural heart disease with  marked reduction of her left atrial dilatation, improvement of her degree of regurgitation with only mild mitral and aortic  regurgitation. She has underlying pulmonary disease with severe DLCO, making [place] wary of longterm amiodarone use. I last saw the patient in July where she noted three weeks of worsening shortness of breath with associated recurrence of atrial fibrillation.  She was in atrial fibrillation during that visit with controlled ventricular rate at 70 beats per minute. We discussed management options. Given her isolated recurrence of atrial fibrillation over the course of nearly a year, she opted to proceed with  cardioversion to see how much longer she can have until another breakthrough. She underwent cardioversion subsequently this summer and now returns for followup. She continues to be in normal rhythm, feeling well without any complaints of worsening dyspnea,  palpitations or difficulty with bleeding.   __  REVIEW OF SYSTEMS     General: Denies recent weight loss, weight gain, fever or chills or change in exercise tolerance.; Integumentary : Denies any change in hair or nails, rashes, or skin lesions.; Eyes: wears eye glasses/contact lenses;  Ears, Nose, Throat, Mouth: Denies any hearing loss, epistaxis, hoarseness or difficulty speaking.;Respiratory : dyspnea with exertion; Cardiovascular: Please review HPI; Abdominal  : Denies ulcer disease, hematochezia or melena.;Musculoskeletal:history  of arthritis; Neurological  : Denies any history of recurrent strokes, TIA, or seizure disorder.; Psychiatric: Denies any history of depression, substance abuse or change in cognitive  functions.; Endocrine: hypothyroidism; Hematologic/Immunologic : food allergies  __  PHYSICAL EXAMINATION    Vital Signs:  Blood Pressure:   160/68 Sitting, Left arm, regular cuff  160/68 Sitting, Right arm, regular cuff    Weight: 145.00 lbs.   Height: 65"  BMI: 24   Pulse:  57/min.   Respirations: 16/min.       Constitutional:   Well developed, well nourished, in no acute distress Skin: warm and dry to touch Head : normocephalic, normal female hair pattern Eyes: conjunctivae and lids unremarkable, EOMS intact  ENT: No pallor or cyanosis Neck: carotid pulses are full and equal bilaterally, no JVD, no bruits  Chest: Normal symmetry, clear to auscultation bilaterally, normal A-P diameter, kyphosis, No ronchi with good air movement Cardiac : Regular rhythm, S1 normal, S2 normal, No S3 or S4, Apical impulse not displaced, no murmurs, gallops or rubs detected. Abdomen: bowel sounds normoactive,  no bruits Peripheral Pulses: pulses full and equal in all extremities Extremities/Back : Normal muscle strength and tone., no edema present, kyphosis present, scoliosis present Psychiatric: normal memory  Neurological: oriented to time, person and place, affect appropriate   __    Medications added today by the physician:       ECG:   ECG was performed in today's clinic visit showing sinus rhythm with a left bundle branch block morphology with baseline artifact.     IMPRESSIONS:   1. Persistent atrial fibrillation with failure of Tikosyn, on amiodarone therapy,  with a single episode  after maintenance of normal rhythm over a one year period, with rate-controlled atrial fibrillation.   2. Moderate to severe tricuspid regurgitation, moderate mitral regurgitation, and moderate aortic  regurgitation  with significant improvement with left atrial remodeling with maintenance of normal  rhythm.  3. Mild left atrial dilatation, improved from severe, with maintenance of normal rhythm.   4. Severe DLCO abnormality with moderate obstructive disease,  followed by Dr. Stacy Gardner with  pulmonary function tests last performed in December of 2015 and stable.  5. Hypothyroidism, on Synthroid.     ASSESSMENT AND PLAN:   It was a pleasure to see the patient back in followup. She is maintaining  normal rhythm after her cardioversion this summer and feels well without symptoms  to suggest recurrence. She is due for an upcoming pulmonary followup in the coming months and I will be curious to see her pulmonary function tests. Certainly if she has  worsening decline of her pulmonary function, would have a low threshold to undergo pulmonary vein isolation. Certainly I would be apprehensive of this longterm amiodarone exposure given her underlying pulmonary disease, but she wishes to hold off on any  invasive treatment with pulmonary vein isolation at the current time. We will plan to see the patient in six months' time, but will get screening safety labs for her amiodarone with TSH and pulmonary function tests today. Follow up with Dr. Talmadge Chad  in four months' time.     Jarvis Morgan Amado Coe, MD    ASF/tutlc     cc: Danford Bad MD    eca    ____________________________  TODAYS ORDERS  12 Lead ECG Today  EP Return Visit 6 months  Return Visit 15 MIN 4 months  AST - Aspartate Aminotransferase At Patient Convenience  ALT - Alanine Aminotransferase At Patient Convenience  TSH At Patient Convenience
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
## 2020-09-24 NOTE — Progress Notes (Signed)
[facility] OFFICE  [address] [place], [place] [address]     [name], [place]    Date of Visit:  10/07/2016  Date of Birth: 28-Apr-1932  Age: 87 yrs.   Medical Record Number: [id number]  __  CURRENT DIAGNOSES     1. Mitral valve insufficiency nonrheumatic,  I34.0  2. 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 Unspecified atrial fibrillation, I48.91  4. Arrhythmia-PVCs symptomatic, I49.3  5. CHF chronic systolic, I50.22  6. Atrial Septal Defect, Q21.1  7. Shortness of breath, R06.02  8.  Device check automatic implantable cardiac defibrillator, Z45.02  9. Long term (current) use of anticoagulants, Z79.01  10. Cardiac arrest, history of, Z86.74  __  ALLERGIES     Erythromycin Base, Rash  Tikosyn, Intolerance-unknown  __  MEDICATIONS     1. Fosamax 70  mg tablet, 1 po qweek  2. Align 4 mg capsule, Take as Directed  3. gabapentin 100 mg capsule, 1 po tid  4. magnesium 250 mg tablet, 1 po qd  5. Tylenol Extra Strength 500 mg tablet, 1 po tid  6. multivitamin capsule, 1 po qd  7.  Synthroid 112 mcg tablet, 1 po qd  8. Vitron-C 65 mg iron-125 mg tablet,delayed release, 2 x per week  9. Citracal + D Slow Release 600 mg calcium-500 unit tablet,ext.release, 2 po qd  10. simvastatin 10 mg tablet, 1 po qhs  11. CoQ-10  100 mg capsule, 250 mg per pt. 1 po qd  12. fluticasone 100 mcg/actuation blister powder for inhalation, Take as Directed  13. carvedilol 12.5 mg tablet, 1 po bid  14. Lasix 40 mg tablet, 1 po qd  15. potassium chloride ER 10 mEq tablet,extended  release, bid  16. Xarelto 20 mg tablet, 1 po qd  17. metolazone 2.5 mg tablet, 1 po 30 minutes prior to lasix 3 times a week  __  CHIEF COMPLAINT/REASON FOR VISIT   ASD and Followup of Cardiomyopathy dilated  __  HISTORY OF PRESENT ILLNESS  Ms. Gerst returns today for followup. I discussed her case with Dr.  Amado Coe and Dr. Talmadge Chad. The patient has had a decline in left ventricular ejection fraction; however, today's echo actually shows an ejection fraction of 45%.  There is continuous left to right shunting. The right ventricle and right atrium remain dilated  and there is moderate to severe tricuspid regurgitation.     The patient does complain of dyspnea on exertion. She also complains of ankle edema and she is requiring metolazone to control this as well as stockings.      PHYSICAL EXAMINATION    Vital Signs:  Blood Pressure:   112/68 Sitting, Left arm, regular cuff  112/68 Sitting, Right arm, regular cuff    Weight: 130.80 lbs.   Height: 64"  BMI: 22   Pulse:  72/min.       Constitutional: Well developed, well nourished, in no acute distress  Skin: warm and dry to touch Head: normocephalic, normal  female hair pattern Eyes: conjunctivae and lids unremarkable, EOMS intact ENT : No pallor or cyanosis Neck: JVD noted at 90 degrees  Chest: Normal symmetry, clear to auscultation bilaterally, normal respiratory effort, ICD site left infraclavicular area Cardiac : Regular rhythm, S1 normal, S2 normal, No S3 or S4, 2/6 systolic murmur LSB, with ectopic beats Abdomen: bowel sounds normoactive, no bruits  Peripheral Pulses: pulses full and equal in all extremities Extremities/Back : 1+ bilateral ankle edema Psychiatric: normal memory  Neurological: oriented to time, person and  place, affect appropriate   __    Medications added  today by the physician:        IMPRESSIONS:   1. Iatrogenic atrial septal defect, which remains  patent and the patient developed symptoms of right   heart failure reportedly after the ablation procedure.   2. Cardiac catheterization done in October revealed a Qp.Qs of 1.6.   3. Echocardiogram shows a dilated right ventricle and right  atrium with moderate to severe tricuspid   regurgitation.   4. Dilated cardiomyopathy, however, the ejection fraction appears to improve to 45%.  5. History of sudden death, status post ICD implant.   6. History of atrial fibrillation-status  post complex ablation.   7. Chronic anticoagulation with Xarelto.   8. History of  celiac disease.   9. Kyphoscoliosis with moderate restriction.   10. Hypothyroidism.   11. Chronic anemia.      RECOMMENDATIONS AND DISCUSSION:   The patient's ejection fraction, which has now improved to 45%, is unrelated to her atrial septal defect; however, the continuous left to right shunt, which persists  greater than six months post ablation, and the fact that her right ventricle is dilated as is her right atrium and that she has moderate to severe regurgitation and the fact that she has symptoms of right sided failure, indicates a closure may be of benefit.  We discussed percutaneous closure of the atrial septal defect with ICE guidance. I explained the risks of the procedure, including stroke, device embolization, emergency surgery, major bleeding episode, and worsening of her rhythm issues. I also explained  to her that there is a possibility that the closure may not help her symptoms; however, I am hoping that with shutting down of the continuous shunt, her RV size may decrease and therefore, tricuspid regurgitation may also improve. In reviewing her echocardiogram,  which was done in March 2017 before the ablation, her RV does not appear to be as enlarged with only one parameter being abnormal, whereas the RV measurements are worse on the most recent echo. Basically the degree of tricuspid regurgitation was described  as moderate, whereas it is now more moderate to severe.     We will schedule the closure in the next several weeks. She was advised to hold her Xarelto for 72 hours before the procedure.    Massie Maroon, MD, FACC, FSCAI     Tid: [id number]     cc: Encarnacion Slates MD  ADAM Amado Coe MD  Danford Bad MD     eca  ____________________________  Christianne Dolin  Basic Metabolic Panel First Available  CBC with Differential First Available  Left Heart  Cath 2 weeks

## 2020-09-24 NOTE — Progress Notes (Signed)
[facility]  [address] [place], [place] [address]     [name], [place]    Date of Visit:  12/10/2015  Date of Birth: [birth year]  Age: 87 yrs.   Medical Record Number: [id number]  __  CURRENT DIAGNOSES     1. Mitral valve insufficiency nonrheumatic,  I34.0  2. Unspecified atrial fibrillation, I48.91  3. Long term (current) use of anticoagulants, Z79.01  4. Shortness of breath, R06.02  __  ALLERGIES     Erythromycin Base, Rash  __  MEDICATIONS     1. Fosamax 70 mg tablet, 1 po qweek  2. Align  4 mg capsule, Take as Directed  3. gabapentin 100 mg capsule, 1 po tid  4. magnesium 250 mg tablet, 1 po qd  5. Tylenol Extra Strength 500 mg tablet, 1 po tid  6. multivitamin capsule, 1 po qd  7. lisinopril 5 mg tablet, 1 po qd   8. Synthroid 112 mcg tablet, 1 po qd  9. Vitron-C 65 mg iron-125 mg tablet,delayed release, 2 x per week  10. Lasix 20 mg tablet, 1 po qod  11. Citracal + D Slow Release 600 mg calcium-500 unit tablet,ext.release, 2 po qd  12. simvastatin  10 mg tablet, 1 po qhs  13. warfarin 5 mg tablet, 1 po qd or as directed by [facility]  14. CoQ-10 100 mg capsule, 1 po qd  __  CHIEF COMPLAINT/REASON FOR VISIT   Followup of Persitsent Atrial fibrillation  __  HISTORY OF PRESENT ILLNESS  The patient is an 87 year old female with a history of persistent atrial  fibrillation, hypothyroidism, celiac disease, and severe valvular disease. She failed Tikosyn therapy and had been on amiodarone since June of 2015. Prior to maintaining normal rhythm it was unclear what component of her dyspnea was secondary to atrial  fibrillation versus her moderate tricuspid regurgitation and moderate aortic and mitral regurgitation with underlying pulmonary disease, however with the maintenance of normal rhythm she had significant improvement of symptomology and improvement of her  regurgitation to mild to moderate aortic and moderate mitral, with decrease in her left atrial size. Unfortunately she had episodic  breakthrough on amiodarone requiring cardioversions. Given lack of efficacy and concerns of affecting underlying pulmonary  condition, I discontinued her amiodarone in April. Since I last saw the patient she underwent knee surgery with knee replacement and is healing well. All other review of systems is negative.   __   PAST HISTORY     Past Medical Illnesses: celiac disease, Chronic back pain, Intermittent steroid spinal  injections, COPD;  Past Cardiac Illnesses: Chest pain 2007, arrythmia 2007, PFO, Atrial fibrillation-persistent,  Mitral regurgitation; Infectious Diseases: Staph infection at age 13 -64, Chicken Pox; Surgical Procedures : Tonsilectomy,Apandectomy , Knee surgery 2002; Trauma History: whip lash 1980; Cardiology Procedures-Invasive : Echo 08, Stress test 08, Cardiac Cath March 2014, Cardioversion May 2015, Cardioversion July 2015, Cardioversion for Afib August 2016; Cardiology Procedures-Noninvasive : Echocardiogram June 2010, CT scan 01/27/07, Treadmill Stress Test, MPI (Nuclear) Study January 2013, Echocardiogram January 2013, TEE February 2013, Echocardiogram December 2013, TEE January 2014, Echocardiogram February 2015, Holter Monitor July 2015;  Cardiac Cath Results: 08/2012: EF 45%; RCA 20% stenosis in prox.; mild pulm. htn, no significant obstructive CAD.; Left Ventricular Ejection Fraction : LVEF of 60% documented via echocardiogram on 07/28/2013  ___  FAMILY HISTORY   Father -- No significant family history  Mother -- Influenza/Pneumonia (cause of death)     SOCIAL HISTORY  Alcohol Use: Does not use alcohol;  Smoking: Does not smoke; Never smoker ([id number]); Diet: Regular diet and Caffeine use-1-2 per day;  Lifestyle: Married; Exercise: Exercises occasionally;   __   PHYSICAL EXAMINATION    Vital Signs:  Blood Pressure:   110/70 Sitting, Left arm, regular cuff  116/80 Sitting, Right arm, regular cuff    Weight: 143.00 lbs.   Height: 65"  BMI: 24   Pulse:  98/min. Apical Irregularly,  irregular       Constitutional: Well developed, well nourished,  in no acute distress Skin: warm and dry to touch Head : normocephalic, normal female hair pattern Eyes: conjunctivae and lids unremarkable, EOMS intact  ENT: No pallor or cyanosis Neck:  carotid pulses are full and equal bilaterally, no JVD, no bruits Chest: Normal symmetry, clear to auscultation bilaterally  Cardiac: irregular rrhythm, S1 normal, S2 normal, No S3 or S4, Apical impulse not displaced, no murmurs, gallops or rubs detected. Abdomen : bowel sounds normoactive, no bruits Peripheral Pulses: pulses full and equal in all extremities  Extremities/Back: Normal muscle strength and tone., no edema present, kyphosis present, scoliosis present Psychiatric : normal memory Neurological: oriented to time, person  and place, affect appropriate   __    Medications added today by the physician:       ECG:  ECG performed in Clinic today shows sinus rhythm with borderline rate control, with left anterior fascicular block, with incomplete left bundle.     IMPRESSIONS:   1. Persistent atrial fibrillation with failure of Tikosyn and amiodarone.   2. Mixed valve disease with moderate mitral and mild to moderate aortic regurgitation.   3. Severe DLCO  abnormality, moderate obstructive pattern, followed by Dr. Stacy Gardner.  4. Scoliosis.    ASSESSMENT AND PLAN:  It was a pleasure to see the patient  in followup. She has significant improvement of symptoms while in normal rhythm and also with maintenance of normal rhythm she had beneficial remodeling with left aortic and mitral regurgitation and reduction in the size of her atrium. We discussed considerations  of either leaving her in rate-controlled atrial fibrillation with tolerating of symptoms, versus further attempts to maintain normal rhythm. Given the fact that she does have such a symptomatic improvement and has already failed amiodarone and Tikosyn,  and has had beneficial cardiac remodeling while in normal  rhythm, I see value in further attempts to maintain normal rhythm. We discussed the steps of an ablation, some of the chances, and the definition of success and the steps of the procedure, as well  as potential complications. We also discussed leaving her in atrial fibrillation with controlled ventricular rates. The patient wishes to proceed with further attempts to maintain normal rhythm and we will work on scheduling in the near future.     Jarvis Morgan Amado Coe, MD    ASF/tutbh    cc: Danford Bad MD    rw  ___________________________   Christianne Dolin  AF Ablation w/ICE At [facility]  12 Lead ECG Today

## 2020-09-24 NOTE — Progress Notes (Signed)
[facility] OFFICE  [address] [place], [place] [address]     [name], [place]    Date of Visit:  09/25/2016  Date of Birth: 02-21-1932  Age: 87 yrs.   Medical Record Number: [id number]  __  CURRENT DIAGNOSES     1. Mitral valve insufficiency nonrheumatic,  I34.0  2. 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 Unspecified atrial fibrillation, I48.91  4. CHF chronic systolic, I50.22  5. Atrial Septal Defect, Q21.1  6. Shortness of breath, R06.02  7. Long term (current) use of anticoagulants,  Z79.01  8. Cardiac arrest, history of, Z86.74  __  ALLERGIES    Erythromycin Base, Rash   Tikosyn, Intolerance-unknown  __  MEDICATIONS     1. Fosamax 70 mg tablet, 1 po qweek  2.  Align 4 mg capsule, Take as Directed  3. gabapentin 100 mg capsule, 1 po tid  4. magnesium 250 mg tablet, 1 po qd  5. Tylenol Extra Strength 500 mg tablet, 1 po tid  6. multivitamin capsule, 1 po qd  7. Synthroid 112 mcg tablet, 1  po qd  8. Vitron-C 65 mg iron-125 mg tablet,delayed release, 2 x per week  9. Citracal + D Slow Release 600 mg calcium-500 unit tablet,ext.release, 2 po qd  10. simvastatin 10 mg tablet, 1 po qhs  11. CoQ-10 100 mg capsule, 250 mg per  pt. 1 po qd  12. fluticasone 100 mcg/actuation blister powder for inhalation, Take as Directed  13. Xarelto 20 mg tablet, qd  14. carvedilol 12.5 mg tablet, 1 po bid  15. potassium chloride ER 10 mEq tablet,extended release, 2 po qd with  lasix  16. Lasix 40 mg tablet, 1 po qd  __  CHIEF COMPLAINT/REASON FOR VISIT  Followup of CHF chronic systolic  __   HISTORY OF PRESENT ILLNESS  Mrs. Ellingwood comes in accompanied by her husband. She is feeling better and brings in a record of her home weights. They went down until she came to the end of her metolazone  therapy, at which point they have leveled off. Her breathing is better and she is now back to physical therapy at least once a week. Her repeat electrolytes seem to be at target. As she is not losing weight and she still has some swelling,  I have recommended  that she change her Lasix regimen from 20 mg twice daily to 40 mg once daily. In addition, we may have to add metolazone, but hopefully only on an intermittent basis. Additionally, I have encouraged her to increase her potassium intake for the next couple  of days as her last potassium level was on the lower end.  __  PAST HISTORY     Past Medical  Illnesses: celiac disease, Chronic back pain, Intermittent steroid spinal injections, COPD;  Past Cardiac  Illnesses: Chest pain 2007, arrythmia 2007, PFO, Atrial fibrillation-persistent, Mitral regurgitation; Infectious Diseases : Staph infection at age 37 -59, Chicken Pox; Surgical Procedures: Tonsilectomy,Apandectomy , Knee surgery 2002, total R knee replacement;  Trauma History: whip lash 1980; Cardiology Procedures-Invasive: Echo 08, Stress test 08, Cardiac Cath March  2014, Cardioversion May 2015, Cardioversion July 2015, Cardioversion for Afib August 2016, Cardiac Cath (left and right) October 2017, St Jude dual chamber ICD 05/25/16; Cardiology Procedures-Noninvasive : Echocardiogram June 2010, CT scan 01/27/07, Treadmill Stress Test, MPI (Nuclear) Study January 2013, Echocardiogram January 2013, TEE February 2013, Echocardiogram December 2013, TEE January 2014, Echocardiogram February 2015, Holter Monitor July 2015,  Echocardiogram November 2017; Cardiac Cath  Results: no significant obstructive CAD, mild pulm HTN, R to L shunt with Qp:Qs 1.6, LVEDP ;  Left Ventricular Ejection Fraction: LVEF of 25% documented via echocardiogram on 05/19/2016  SOCIAL HISTORY     Alcohol Use: Does not use alcohol; Smoking: Does not smoke; Never smoker ([id number]);  Diet: Regular diet and Caffeine use-1-2 per day; Lifestyle: Married;  Exercise: Exercises occasionally;   __  PHYSICAL EXAMINATION     Vital Signs:  Blood Pressure:  120/62 Sitting, Left arm, regular cuff  122/70 Sitting, Right arm,  regular cuff    Weight: 134.80 lbs.  Height: 65"   BMI: 22    Pulse: 82/min. Apical Regular        Constitutional: Well developed, well nourished, in no acute distress Skin: warm and dry to touch  Head: normocephalic, normal female hair pattern Eyes:  conjunctivae and lids unremarkable, EOMS intact ENT: No pallor or cyanosis  Neck: JVD present Chest: Normal symmetry, clear to auscultation  bilaterally, normal respiratory effort, ICD site left infraclavicular area Cardiac: Regular rhythm, S1 normal, S2 normal, No S3 or S4, 2/6 systolic murmur  LSB, with ectopic beats Abdomen: bowel sounds normoactive, no bruits Peripheral Pulses : pulses full and equal in all extremities Extremities/Back: 1+ bilateral ankle edema  Psychiatric: normal memory Neurological:  oriented to time, person and place, affect appropriate   __    Medications added today by the physician:  Lasix 40 mg tablet, 1 po qd, 1       IMPRESSIONS:   1. Acute on chronic systolic congestive heart failure, improved with BNP down and peripheral   edema improved.  2. Nonischemic cardiomyopathy with EF 25% on last echo on May 19, 2016.  3. Transseptal ASD with left-to-right shunt and QP/QS 1.6.  4. Cardiac arrest, presumably due to prolonged QT with Tikosyn therapy on May 19, 2016   (torsades type). Tikosyn discontinued and ICD implanted. Followup with Dr.  Amado Coe next week.  5. Recurrent persistent atrial fibrillation with failure of amiodarone and extensive left and right   atrial ablation and pulmonary vein isolation with FIRM-guided bitemporal dispersion ablation   in September 2016.  6.  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 Stacy Gardner.  7. Moderate to severe mitral valve regurgitation with biatrial enlargement and severe tricuspid   regurgitation. Right and left heart catheterization on April 16, 2016: Mean PA peak 28   mmHg. No significant obstructive CAD.  8. Chronic Xarelto anticoagulation due to CHA2DS2-VASc score of three based on age and   female gender.  9.  History of celiac disease.  10. Chronic anemia.  11.  Kyphoscoliosis with component of restriction.  12. Hypothyroidism, on Synthroid.    RECOMMENDATIONS:   1. The patient is going to change her  Lasix regimen to 40 mg once daily as opposed to   20 mg b.i.d.  2. She is going to increase her potassium supplementation to 30 mEq daily for Saturday   and Sunday.  3. She is going to get routine lab tests early next week, either Monday  or Tuesday.  4. She has a followup with Dr. Amado Coe scheduled later in the week and should come back to   see me in about three weeks. Certainly, she should call us earlier if she does not   experience an improvement in her weight and degree  of swelling. We may need to   ultimately put her on metolazone once or twice weekly, but I did  caution her about   electrolyte depletion.    Encarnacion Slates, MD     Tid: [id number]    cc: Bryon Lions MD  Molli Posey REESE MD     AP  ____________________________  Christianne Dolin   Return Visit 15 MIN 3 weeks  Basic Metabolic Panel 3 days  Magnesium, RBC 3 days  Brain Natriuretic Peptide 3 days

## 2020-09-24 NOTE — Progress Notes (Signed)
[facility] OFFICE  [address] [place], [place] [address]     [name], [place]    Date of Visit:  07/20/2017  Date of Birth: 1931-12-23  Age: 87 yrs.   Medical Record Number: [id number]  Referring Physician: Casimer Bilis MD, [place] J  __   CURRENT DIAGNOSES     1. Mitral valve insufficiency nonrheumatic, I34.0  2. 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 Atrial Fibrillation, Paroxysmal, I48.0  4. Atrial Fibrillation, Persistent,  I48.1  5. Arrhythmia-PVCs symptomatic, I49.3  6. CHF chronic systolic, I50.22  7. Atrial Septal Defect, Q21.1  8. Shortness of breath, R06.02  9. Device check automatic implantable cardiac defibrillator, Z45.02  10. Long term (current)  use of anticoagulants, Z79.01  11. Cardiac arrest, history of, Z86.74  12. Status post ICD, Z95.810  13. Status post atrial septal defect repair, Z98.890  14. Poisoning By Loop Diuretics, Accidental, Subs, T50.1X1D  __   ALLERGIES    Erythromycin Base, Rash  Tikosyn, Intolerance-unknown  __   MEDICATIONS     1. Align 4 mg capsule, Take as Directed  2. amiodarone 200 mg tablet, 1 po daily  3. carvedilol 12.5 mg tablet, 1 po bid  4. Citracal + D Slow Release 600 mg calcium-500  unit tablet,ext.release, 2 po qd  5. coenzyme Q10 100 mg capsule, 1 po qd  6. fluticasone 100 mcg/actuation blister powder for inhalation, Take as Directed  7. Fosamax 70 mg tablet, 1 po qweek  8. gabapentin 100 mg capsule, 1 po tid   9. Lasix 40 mg tablet, 1 TAB po daily  10. losartan 25 mg tablet, 1 po qd  11. magnesium 200 mg tablet, 1 po qd  12. multivitamin capsule, 1 po qd  13. simvastatin 10 mg tablet, 1 po qhs  14. spironolactone 25 mg tablet, 1/2 tab po  qd  15. Synthroid 112 mcg tablet, 1 po qd  16. Tylenol Extra Strength 500 mg tablet, 1 po tid  17. Vitron-C 65 mg iron-125 mg tablet,delayed release, 2 x per week  18. Xarelto 15 mg tablet, 1 po qd  __   CHIEF COMPLAINT/REASON FOR VISIT  Followup of Atrial Fibrillation, Paroxysmal, Followup of Cardiomyopathy dilated, Followup of  CHF chronic systolic and Followup of Mitral valve insufficiency nonrheumatic   __  HISTORY OF PRESENT ILLNESS  [name] returns for clinical followup and is actually feeling a bit better. She says she is less short of breath  and has a little bit more energy. This is obviously great news and she has actually been successful in some degree of weight loss since I saw her last. She had quite an eventful course since November. She was seen in EP Clinic May 04, 2017, and plans  were made to proceed with electrical cardioversion at [facility]. This was after she received an amiodarone load. She underwent attempted cardioversion, but it was not successful and she was felt to be in heart failure. She required increase  in her diuretics and a transesophageal echo was performed May 27, 2017. Her left ventricular function was further reduced to 25% and there was evidence of moderate mitral regurgitation. There was moderate tricuspid regurgitation as well. Right ventricular  function was reduced as well. There was no evidence of shunt across the atrial septum. The patient was seen in electrophysiology followup June 01, 2017, and risks, benefits and alternatives to upgrading her ICD were discussed. She elected to proceed  and this was done June 28, 2017. As above, she  seems to be doing slightly better.  __  PAST HISTORY      Past Medical Illnesses: celiac disease, Chronic back pain, Intermittent steroid spinal injections, COPD, hypothyroid;   Past Cardiac Illnesses: Chest pain 2007, arrythmia 2007, PFO, Atrial fibrillation-persistent, Mitral regurgitation, Cardiomyopathy, HFwREF, VT; Infectious  Diseases: Staph infection at age 13 -77, Chicken Pox; Surgical Procedures: Tonsilectomy,Apandectomy , Knee  surgery 2002, total R knee replacement, ASD Repair; Trauma History: whip lash 1980; Cardiology Procedures-Invasive : Echo 08, Stress test 08, Cardiac Cath March 2014, Cardioversion May 2015,  Cardioversion July 2015, Cardioversion for Afib August 2016, Cardiac Cath (left and right) October 2017, St Jude dual chamber ICD 05/25/16, Cardiac Cath (right) May 2018, ASD closure  11/11/16, ASD closure 11/11/16, Pacemaker implant January 2019; Cardiology Procedures-Noninvasive: CT scan 01/27/07, Treadmill Stress Test, MPI (Nuclear) Study  January 2013, TEE 07/2011, 06/2012, Holter Monitor July 2015, Echocardiogram 11/2008, 06/2011, 05/2012, 07/2013, 04/2016, 10/2016, 03/2017; Cardiac Cath Results : no significant obstructive CAD, mild pulm HTN, R to L shunt with Qp:Qs 1.6, LVEDP ; Left Ventricular Ejection Fraction: LVEF of 25% documented  via echocardiogram on 04/07/2017  SOCIAL HISTORY    Alcohol Use : Does not use alcohol; Smoking: Does not smoke; Never smoker ([id number]); Diet : Regular diet and Caffeine use-1-2 per day; Lifestyle: Married, 2 sons - one in [place] and second in [place];  Exercise: Exercises occasionally;   __  PHYSICAL EXAMINATION     Vital Signs:  Blood Pressure:  136/70 Sitting, Left arm, regular cuff  136/68 Sitting, Right arm,  regular cuff    Weight: 126.00 lbs.  Height: 64"   BMI: 21   Pulse: 68/min.        Constitutional: thin frail chronically ill appearing,pale, using cane Skin: surgical scars well-healed,  warm and dry to touch, erythematous fingertips, bilateral petecchial rash on legs Head: normocephalic,  normal female hair pattern Eyes: pale conjunctivae ENT : Ears, Nose and throat reveal no gross abnormalities, No pallor or cyanosis Neck:  JVP normal, no carotid bruit, thyroid not enlarged Chest: Normal symmetry, kyphosis, clear to auscultation bilaterally  Cardiac: Irregularly irregular rhythym; normal S1 and S2. Grade 2/6 SM @ LSB Abdomen: bowel sounds normoactive  Peripheral Pulses: bilateral radial pulse(s) 1+ Extremities/Back : 1+ bilateral pretibial edema Psychiatric: mild forgetfulness  Neurological: oriented to time, person and place, affect appropriate   __    Medications added   today by the physician:  losartan 25 mg tablet, 1 po qd, 90  spironolactone 25 mg tablet, 1/2 tab po qd, 45      IMPRESSIONS:    1. Class III chronic systolic heart failure with left ventricular ejection fraction 22% by TEE   May 27, 2017.  2. Moderate mitral regurgitation with moderate pulmonary hypertension on transesophageal   echo. No shunt across the  atrial septum. Evidence for right ventricular dysfunction as well.  3. Atrial septal defect status post closure Nov 11, 2016. She completed three months of   Plavix.  4. Persistent atrial fibrillation, currently in sinus rhythm after cardioversion  during biventricular   ICD upgrade. Prior failure of amiodarone with subsequent extensive left and right atrial   ablation with focal impulse rotor modulation guided bitemporal dispersion ablation   September 2017.  5. Severe cardiomyopathy  with left bundle branch block and heart failure resulting in   biventricular ICD upgrade on June 28, 2017.  6. CHADS-VASc score of four on 15 mg of Xarelto due to a decline in renal function.  7. VF arrest in the setting of new cardiomyopathy  while on Tikosyn status post original ICD   implant December 2017.  8. Cardiac catheterization showing no significant coronary disease Nov 11, 2016.  9. Kyphoscoliosis with moderate restriction. Severe DLCO abnormality followed with    Dr. Stacy Gardner.  10. Hypothyroidism.  11. Intolerant of ACE inhibitor due to hypotension previously.    RECOMMENDATIONS:   1. Will increase  losartan to 25 mg daily and refill spironolactone.  2. Early followup in one month to ensure that she is remaining clinically stable and does   not require further adjustment of her medication. The fact that she is feeling better in   sinus  rhythm is consistent with her previous history.    Encarnacion Slates, MD     Tid: [id number]    cc: Bryon Lions MD  [place] Docia Furl MD   Danford Bad MD    AP  ____________________________  Christianne Dolin

## 2020-09-24 NOTE — Progress Notes (Signed)
[facility] OFFICE  [address] [place], [place] [address]     [name], [place]    Date of Visit:  09/11/2016  Date of Birth: 1931-10-11  Age: 87 yrs.   Medical Record Number: [id number]  __  CURRENT DIAGNOSES     1. Mitral valve insufficiency nonrheumatic,  I34.0  2. 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 Unspecified atrial fibrillation, I48.91  4. CHF chronic systolic, I50.22  5. Shortness of breath, R06.02  6. Cardiac arrest, history of, Z86.74  7. Long term (current) use of anticoagulants,  Z79.01  8. Atrial Septal Defect, Q21.1  __  ALLERGIES    Erythromycin Base, Rash  Tikosyn,  Intolerance-unknown  __  MEDICATIONS     1. Fosamax 70 mg tablet, 1 po qweek  2. Align 4  mg capsule, Take as Directed  3. gabapentin 100 mg capsule, 1 po tid  4. magnesium 250 mg tablet, 1 po qd  5. Tylenol Extra Strength 500 mg tablet, 1 po tid  6. multivitamin capsule, 1 po qd  7. Synthroid 112 mcg tablet, 1 po qd   8. Vitron-C 65 mg iron-125 mg tablet,delayed release, 2 x per week  9. Citracal + D Slow Release 600 mg calcium-500 unit tablet,ext.release, 2 po qd  10. simvastatin 10 mg tablet, 1 po qhs  11. CoQ-10 100 mg capsule, 250 mg per pt. 1 po qd   12. fluticasone 100 mcg/actuation blister powder for inhalation, Take as Directed  13. Lasix 20 mg tablet, 1 po qd  14. Xarelto 20 mg tablet, qd  15. carvedilol 12.5 mg tablet, 1 po bid  16. potassium chloride ER 10 mEq tablet,extended  release, 1 po qd with lasix  17. Myrbetriq 25 mg tablet,extended release, 1 po qd  __  CHIEF COMPLAINT/REASON FOR VISIT  Shortness of breath   __  HISTORY OF PRESENT ILLNESS  Breanah dog returns for clinical followup. Unfortunately, she appears to have developed significant volume overload  since I saw her August 14, 2016. At that visit, we discontinued lisinopril due to severe fatigue and resumed carvedilol. Additionally, she has had addition of Mybetriq in the interim. This was to help with urinary frequency. It is a little unclear as   to whether she has developed decompensated heart failure related to decline in urine output or addition of the beta blocker. Either way, we discussed increasing furosemide to b.i.d. dosing and obtaining some labs. She was encouraged to weigh herself daily  and to call us if there is no improvement. She and her husband understand that she should potentially be hospitalized if she fails outpatient therapy.  __  PAST HISTORY      Past Medical Illnesses: celiac disease, Chronic back pain, Intermittent steroid spinal injections, COPD;   Past Cardiac Illnesses: Chest pain 2007, arrythmia 2007, PFO, Atrial fibrillation-persistent, Mitral regurgitation; Infectious Diseases : Staph infection at age 11 -58, Chicken Pox; Surgical Procedures: Tonsilectomy,Apandectomy , Knee surgery 2002, total R knee replacement;  Trauma History: whip lash 1980; Cardiology Procedures-Invasive: Echo 08, Stress test 08, Cardiac Cath March  2014, Cardioversion May 2015, Cardioversion July 2015, Cardioversion for Afib August 2016, Cardiac Cath (left and right) October 2017, St Jude dual chamber ICD 05/25/16; Cardiology Procedures-Noninvasive : Echocardiogram June 2010, CT scan 01/27/07, Treadmill Stress Test, MPI (Nuclear) Study January 2013, Echocardiogram January 2013, TEE February 2013, Echocardiogram December 2013, TEE January 2014, Echocardiogram February 2015, Holter Monitor July 2015,  Echocardiogram November 2017; Cardiac Cath Results: no significant obstructive CAD, mild pulm  HTN, R to L shunt with Qp:Qs 1.6, LVEDP ;  Left Ventricular Ejection Fraction: LVEF of 25% documented via echocardiogram on 05/19/2016  ___   FAMILY HISTORY  Father -- No significant family history   Mother -- Influenza/Pneumonia (cause of death)    SOCIAL HISTORY     Alcohol Use: Does not use alcohol; Smoking: Does not smoke; Never smoker ([id number]);  Diet: Regular diet and Caffeine use-1-2 per day; Lifestyle: Married;  Exercise: Exercises occasionally;    __  PHYSICAL EXAMINATION     Vital Signs:  Blood Pressure:  132/70 Sitting, Left arm, regular cuff  130/70 Sitting, Right arm,  regular cuff    Weight: 145.20 lbs.  Height: 65"   BMI: 24   Pulse: 84/min. Radial        Constitutional: Well developed, well nourished, in no acute distress Skin: warm and dry to touch  Head: normocephalic, normal female hair pattern Eyes:  conjunctivae and lids unremarkable, EOMS intact ENT: No pallor or cyanosis  Neck: carotid pulses are full and equal bilaterally, no JVD, no bruits Chest : Normal symmetry, clear to auscultation bilaterally, normal respiratory effort, ICD site left infraclavicular area Cardiac: Regular rhythm, S1 normal,  S2 normal, No S3 or S4, 2/6 systolic murmur LSB, with ectopic beats Abdomen: bowel sounds normoactive, no bruits  Peripheral Pulses: pulses full and equal in all extremities Extremities/Back : 3+ bilateral calf edema Psychiatric: normal memory  Neurological: oriented to time, person and place, affect appropriate   __    Medications added  today by the physician:        IMPRESSIONS:   1. Dyspnea on exertion with peripheral edema  consistent with decompensated acute-on-chronic   systolic congestive heart failure.  2. Nonischemic cardiomyopathy with EF 25% on last echo May 19, 2016.  3. Transseptal ASD with left to right shunt QP to QS 1.6.  4. Cardiac arrest,  presumably due to prolonged QT with Tikosyn therapy May 19, 2016   (torsades type). Tikosyn discontinued and ICD implanted.  5. Recurrent persistent atrial fibrillation with failure of amiodarone and extensive left and right atrial    ablation and pulmonary vein isolation with FIRM-guided bitemporal dispersion ablation in   September 2016.  6. 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 Stacy Gardner.  7. Moderate to severe mitral valve regurgitation  with biatrial enlargement and severe tricuspid   regurgitation. Right and left heart catheterization, April 16, 2016: Mean PA peak 28 mmHg.   No significant obstructive CAD.  8. Chronic Xarelto anticoagulation due to CHADS-VASc score of  three based on age and female   gender.  9. History of celiac disease.  10. Chronic anemia.  11. Kyphoscoliosis with component of restriction.  12. Hypothyroidism, on Synthroid.  13. Situational stress, as the primary caregiver  for her husband who has been reported to have some   mild dementia.    RECOMMENDATIONS:   1. Increase furosemide to b.i.d. dosing. The patient  is to weigh herself daily and report back if this   does not result in improvement. She understands that she may require hospitalization if she fails   outpatient therapy and attempt at diuresis.  2. Early return follow up next week for clinical  reassessment. She was also provided him a lab slip to   check BNP and BMP levels.    Encarnacion Slates, MD     Tid: [id number]    cc: Danford Bad MD     eca  ____________________________  Christianne Dolin  Return Visit 15 MIN 1 week  Brain Natriuretic Peptide First Available  Basic Metabolic  Panel First Available  12 Lead ECG Today

## 2020-09-24 NOTE — Progress Notes (Signed)
[name]  [address] [place], [place] [address]     [name], [place]    Date of Visit:  10/27/2016  Date of Birth: 1931-08-24  Age: 87 yrs.   Medical Record Number: [id number]  __  CURRENT DIAGNOSES     1. Arrhythmia-PVCs symptomatic, I49.3   2. Atrial Septal Defect, Q21.1  3. Shortness of breath, R06.02  4. Device check automatic implantable cardiac defibrillator, Z45.02  5. Long term (current) use of anticoagulants, Z79.01  6. Cardiac arrest, history of, Z86.74  7. CHF  chronic systolic, I50.22  8. Mitral valve insufficiency nonrheumatic, I34.0  9. Cardiomyopathy dilated, I42.0  10. Unspecified atrial fibrillation, I48.91  __  ALLERGIES     Erythromycin Base, Rash  Tikosyn, Intolerance-unknown  __  MEDICATIONS     1. Fosamax 70  mg tablet, 1 po qweek  2. Align 4 mg capsule, Take as Directed  3. gabapentin 100 mg capsule, 1 po tid  4. magnesium 250 mg tablet, 1 po qd  5. Tylenol Extra Strength 500 mg tablet, 1 po tid  6. multivitamin capsule, 1 po qd  7.  Synthroid 112 mcg tablet, 1 po qd  8. Vitron-C 65 mg iron-125 mg tablet,delayed release, 2 x per week  9. Citracal + D Slow Release 600 mg calcium-500 unit tablet,ext.release, 2 po qd  10. simvastatin 10 mg tablet, 1 po qhs  11. CoQ-10  100 mg capsule, 250 mg per pt. 1 po qd  12. fluticasone 100 mcg/actuation blister powder for inhalation, Take as Directed  13. carvedilol 12.5 mg tablet, 1 po bid  14. Lasix 40 mg tablet, 1 po qd  15. potassium chloride ER 10 mEq tablet,extended  release, bid  16. Xarelto 20 mg tablet, 1 po qd  17. metolazone 2.5 mg tablet, 1 po 30 minutes prior to lasix 3 times a week  __  CHIEF COMPLAINT/REASON FOR VISIT   Followup of Arrhythmia-PVCs symptomatic  __  HISTORY OF PRESENT ILLNESS  [name] comes in today for followup after seeing [name] who recommended  she proceed with an ASD closure especially given that her RA and RV were dilated with moderate to severe regurgitation, and she was noting  worsening symptoms of right-sided Teresa failure. She is an 87 year old female with a history of atrial fibrillation  with failure of amiodarone therapy with an extensive left and right atrial ablation with a PVI and focal impulse rotor modulation [facility]) ablation September 2017 with prior use of Tikosyn with a cardiac arrest December 2017. Postoperatively she had a progressive  decline in her LV function with an EF of 35% and presented to the hospital with a cardiac arrest while on Tikosyn. Tikosyn was considered a possible etiology for the arrest but in the setting of progressive LV dysfunction, a secondary ICD was recommended  and implanted December 2017. Since her last visit, she continues on metolazone every other day for progressive CHF symptoms but is also taking Lasix only every other day now. She notes ongoing lower extremity edema as well as dyspnea on exertion. Her  weight is at 135 pounds today. At her last visit, she was 130 pounds on our scale.   __  PAST HISTORY      Past Medical Illnesses: celiac disease, Chronic back pain, Intermittent steroid spinal injections, COPD;   Past Cardiac Illnesses: Chest pain 2007, arrythmia 2007, PFO, Atrial fibrillation-persistent, Mitral regurgitation; Infectious Diseases : Staph infection at age 42 -89, Chicken Pox; Surgical Procedures: Tonsilectomy,Apandectomy ,  Knee surgery 2002, total R knee replacement;  Trauma History: whip lash 1980; Cardiology Procedures-Invasive: Echo 08, Stress test 08, Cardiac Cath March  2014, Cardioversion May 2015, Cardioversion July 2015, Cardioversion for Afib August 2016, Cardiac Cath (left and right) October 2017, [name] dual chamber ICD 05/25/16; Cardiology Procedures-Noninvasive : Echocardiogram June 2010, CT scan 01/27/07, Treadmill Stress Test, MPI (Nuclear) Study January 2013, Echocardiogram January 2013, TEE February 2013, Echocardiogram December 2013, TEE January 2014, Echocardiogram February 2015, Holter Monitor July 2015,   Echocardiogram November 2017; Cardiac Cath Results: no significant obstructive CAD, mild pulm HTN, R to L shunt with Qp:Qs 1.6, LVEDP ;  Left Ventricular Ejection Fraction: LVEF of 25% documented via echocardiogram on 05/19/2016  ___   FAMILY HISTORY  Father -- No significant family history   Mother -- Influenza/Pneumonia (cause of death)    __  CARDIAC RISK FACTORS      Tobacco Abuse: has never used tobacco; Family History of Teresa Disease: positive;  Hyperlipidemia: positive; Hypertension: positive;   Diabetes Mellitus: negative; Prior History of Teresa Disease: negative;  Obesity: negative; Sedentary Life Style:positive; Age :positive; Menopausal:biological menopause  __  SOCIAL HISTORY     Alcohol Use: Does not use alcohol; Smoking: Does not smoke; Never smoker ([id number]);  Diet: Regular diet and Caffeine use-1-2 per day; Lifestyle: Married;  Exercise: Exercises occasionally;   __  PHYSICAL EXAMINATION     Vital Signs:  Blood Pressure:  110/70 Sitting, Right arm, regular cuff     Weight: 135.00 lbs.  Height: 64"  BMI:  23   Pulse: 80/min.   Respirations: 16/min.        Constitutional: Well developed, well nourished, in no acute distress Skin: warm and dry to touch  Head: normocephalic, normal female hair pattern Eyes:  conjunctivae and lids unremarkable, EOMS intact ENT: No pallor or cyanosis  Neck: JVD noted at 90 degrees Chest: Normal symmetry,  clear to auscultation bilaterally, normal respiratory effort, ICD site left infraclavicular area Cardiac: Regular rhythm, S1 normal, S2 normal, No S3 or  S4, 2/6 systolic murmur LSB, with ectopic beats Abdomen: bowel sounds normoactive, no bruits Peripheral  Pulses: pulses full and equal in all extremities Extremities/Back : 1+ bilateral ankle edema Psychiatric: normal memory  Neurological: oriented to time, person and place, affect appropriate   __    Medications added  today by the physician:      IMPRESSIONS:  1. Recurrent persistent atrial fibrillation with  failure of amiodarone and extensive left and right  atrial   focal impulse rotor modulation ablation with pulmonary vein isolation September 2017.  a. Recurrent atrial fibrillation May 7th and 8th noted on device check with   controlled ventricular response, asymptomatic.  b. Continues  on Xarelto for anticoagulation and carvedilol for rate control.  2. Progressive right-sided Teresa failure symptoms with an atrial septal defect now scheduled for   closure 11/11/2016 with [name].  3. Out of hospital cardiac arrest December  2017 on Tikosyn and also with progressive decline   in left ventricular function to 35% status post implantable cardioverter defibrillator implant   December 2017.  a. Most recent echocardiogram showed an improvement in left ventricular    function to 45% on 10/06/2016 with right atrial and right ventricle dilation.   There was also right to left shunting across the septal puncture site   noted at that time.  b. Echocardiogram November 2017 showing a probable medium-sized    atrial septal defect with mild left to right  shunting and an ejection fraction   of 25%.  4. Scoliosis.  5. Severe DLCO abnormality with moderate obstruction followed by Dr. Stacy Gardner.    PLAN:  From an arrhythmia standpoint, Ms.  Bonfiglio unfortunately reverted back to afib it looks like yesterday, but the rates are well controlled, and she continues on Xarelto for anticoagulation. She is scheduled to undergo an ASD closure with [name] at the end of the month, and we will address  afib after the closure. She sees Dr. Talmadge Chad next week, but I have asked her to increase her Lasix to every day rather than every other day when she takes metolazone. She does have significant lower extremity edema today, and her weight is up 5 pounds  from her last visit. I have asked her to take 40 mg a day and continue with the metolazone every other day until she sees Dr. Talmadge Chad next week.    Teresa Slot, PA-C    [id number]/[id number]    cc: Danford Bad MD    AP

## 2020-09-24 NOTE — Progress Notes (Signed)
[name], [name]    Date of Visit:  11/23/2016  Date of Birth: [birth year]  Age: 87 yrs.   Medical Record Number: [id number]  __  CURRENT DIAGNOSES     1. Mitral valve insufficiency nonrheumatic,  I34.0  2. 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 Atrial Fibrillation, Paroxysmal, I48.0  4. Arrhythmia-PVCs symptomatic, I49.3  5. CHF chronic systolic, I50.22  6. Atrial Septal Defect, Q21.1  7. Shortness of breath, R06.02  8.  Device check automatic implantable cardiac defibrillator, Z45.02  9. Long term (current) use of anticoagulants, Z79.01  10. Cardiac arrest, history of, Z86.74  11. Unspecified atrial fibrillation, I48.91  __   ALLERGIES    Erythromycin Base, Rash  Tikosyn, Intolerance-unknown  __   MEDICATIONS     1. Align 4 mg capsule, Take as Directed  2. carvedilol 12.5 mg tablet, 1 po bid  3. Citracal + D Slow Release 600 mg calcium-500 unit tablet,ext.release, 2 po qd  4. clopidogrel  75 mg tablet, 1 po qd  5. CoQ-10 100 mg capsule, 250 mg per pt. 1 po qd  6. fluticasone 100 mcg/actuation blister powder for inhalation, Take as Directed  7. Fosamax 70 mg tablet, 1 po qweek  8. gabapentin 100 mg capsule, 1 po tid   9. Lasix 40 mg tablet, 1 po qd  10. magnesium 250 mg tablet, 1 po qd  11. metolazone 2.5 mg tablet, 1 po 30 minutes prior to lasix 3 times a week  12. multivitamin capsule, 1 po qd  13. potassium chloride ER 10 mEq tablet,extended release,  1 po bid  14. simvastatin 10 mg tablet, 1 po qhs  15. Synthroid 112 mcg tablet, 1 po qd  16. Tylenol Extra Strength 500 mg tablet, 1 po tid  17. Vitron-C 65 mg iron-125 mg tablet,delayed release, 2 x per week  18. Xarelto 20 mg tablet,  1 po qd  __  CHIEF COMPLAINT/REASON FOR VISIT  Hospital Follow up  __  HISTORY OF PRESENT  ILLNESS  Pt is an 87 year old female coming in s/p ASD closure on 11/11/16 with Dr. Garlan Fair. Pt notes the procedure went well. Pt was placed on Plavix post-op for 3 months. She is also continuing to take  Xarelto for afib. The pt denies any  bleeding or significant bruising. The pt denies any complaints of CPs. She does admit to DOE and fatigue, which has not improved much since the procedure. Pt would like to discuss stopping Metolazone, which she is taking  3 days per week for CHF. On the days she takes Metolazone, she feels very weak and tired. She is also on Lasix 40 mg daily for fluid management. Pt denies any PND or orthopnea.    __  PAST HISTORY     Past Medical Illnesses: celiac disease, Chronic back pain, Intermittent steroid spinal injections, COPD;   Past Cardiac Illnesses: Chest pain 2007, arrythmia 2007, PFO, Atrial fibrillation-persistent, Mitral regurgitation; Infectious Diseases : Staph infection at age 54 -79, Chicken Pox; Surgical Procedures: Tonsilectomy,Apandectomy , Knee surgery 2002, total R knee replacement;  Trauma History: whip lash 1980; Cardiology Procedures-Invasive: Echo 08, Stress test 08, Cardiac Cath  March 2014, Cardioversion May 2015, Cardioversion July 2015, Cardioversion for Afib August 2016, Cardiac Cath (left and right) October 2017, St Jude dual chamber ICD 05/25/16, Cardiac Cath (right) May 2018, ASD closure 11/11/16, ASD closure 11/11/16;  Cardiology Procedures-Noninvasive: Echocardiogram June 2010, CT scan 01/27/07, Treadmill Stress Test, MPI (Nuclear) Study January 2013, Echocardiogram January 2013, TEE February 2013,  Echocardiogram December  2013, TEE January 2014, Echocardiogram February 2015, Holter Monitor July 2015, Echocardiogram November 2017, Echocardiogram May 2018; Cardiac Cath Results : no significant obstructive CAD, mild pulm HTN, R to L shunt with Qp:Qs 1.6, LVEDP ; Left Ventricular Ejection Fraction: LVEF of 20% documented  via echocardiogram on 11/12/2016  ___  FAMILY HISTORY  Father --  No significant family history  Mother -- Influenza/Pneumonia (cause of death)    __  CARDIAC RISK FACTORS      Tobacco Abuse: has never used tobacco; Family History of Heart Disease: positive;  Hyperlipidemia:  positive; Hypertension: positive;   Diabetes Mellitus: negative; Prior History of Heart Disease: negative;  Obesity: negative; Sedentary Life Style:positive;  [id number]; Menopausal:biological menopause  __   SOCIAL HISTORY    Alcohol Use: Does not use alcohol;  Smoking: Does not smoke; Never smoker ([id number]); Diet: Regular diet and Caffeine use-1-2 per day;  Lifestyle: Married; Exercise: Exercises occasionally;   __   PHYSICAL EXAMINATION    Vital Signs:  Blood Pressure:  108/70 Sitting, Left arm, regular  cuff  112/70 Sitting, Right arm, regular cuff    Weight: 128.00 lbs.  Height:  64"  BMI: 22   Pulse: 84/min.        Constitutional: Well developed, well nourished, in no acute distress Skin: warm and dry to touch  Head: normocephalic Eyes: conjunctivae and lids normal, visual fields not performed  ENT: No pallor or cyanosis Neck: JVD noted at 90 degrees, carotid pulses are full and equal bilaterally,  no bruits Chest: normal respiratory effort, fine rales both bases Cardiac : Irregularly irregular rhythm with variable 1st heart sound, normal 2nd heart sound, low pitched holosystolic murmur grade 2/6 left sternal border Abdomen : bowel sounds normoactive, no bruits Peripheral Pulses: pulses full and equal in all extremities  Extremities/Back: 1+ bilateral ankle edema Psychiatric: normal memory  Neurological: oriented to time, person and place, affect appropriate   __      Medications added today by the physician:     ECG: Afib, paced, PVCs    IMPRESSIONS:   1. S/P ASD closure on 11/11/16. Transseptal ASD with left to right shunt and Qp:Qs 1.6 by cardiac   catheterization. Now on Plavix for 3 months.  2. Acute-on-chronic systolic congestive heart  failure, now improving since ASD closure. Pt is   currently on metolazone three times weekly, in addition to higher dose furosemide daily.   The patient is developing problems with electrolyte depletion and we have addressed that   on this  visit.  3. Dilated  right-sided chambers with moderate-to-severe tricuspid regurgitation.  4. Nonischemic cardiomyopathy with ejection fraction 25% November 2017, up to 45%   October 06, 2016. Currentlyon carvedilol.   5. Recurrent atrial  fibrillation status post failure of amiodarone and extensive left and right atrial   focus impulsive rotor modulation ablation with pulmonary vein isolation September 2017.On   chronic Xarelto for anticoagulation.  6. Out of hospital cardiac  arrest December 2017, on Tikosyn and also with progressive   decline in left ventricular function, treated with implantable cardioverter defibrillator   implant December 2017.  7. Kyphoscoliosis with moderate restriction.  8. Hypothyroidism.   9. Severe DLCO abnormality followed by Dr. Stacy Gardner.   10. Hypothyroidism, on Synthroid.  11. Hypokalemia, supplemented preop  12. Intolerance to ACE inhibitor due to hypotension.    RECOMMENDATIONS:   1. Will stop Metolazone since  pt feels poorly on these days and will closely monitor CHF  symptoms. Continue low salt diet.  2. Will check a BMP, CBC, and TSH in view of recent hypokalemia and potassium   supplementation as well as persistent fatigue.  3. 2D echocardiogram  today to assess ASD closure, per protocol.  4. Continue Plavix for 3 months s/p ASD closure.  5. Will increase pts Carvedilol to 18.75 mg at nighttime for CMP. Pt to continue morning   dose at 12.5mg .   6. Follow up with Dr. Talmadge Chad  in 3-4 weeks or sooner if necessary.    cc: Danford Bad MD    ____________________________   TODAYS ORDERS  Basic Metabolic Panel Today  CBC without Differential Today  TSH Today  12 Lead ECG Today

## 2020-09-24 NOTE — Progress Notes (Signed)
[facility] - Inpatient OFFICE  [address] [place], [place] [address]     [name], [place]    Date of Visit:  10/02/2016  Date of Birth: [birth year]  Age: 87 yrs.   Medical Record Number: [id number]  __  CURRENT DIAGNOSES     1. Mitral valve insufficiency nonrheumatic,  I34.0  2. 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 Shortness of breath, R06.02  4. Device check automatic implantable cardiac defibrillator, Z45.02  5. CHF chronic systolic, I50.22  6. Atrial Septal Defect, Q21.1  7. Long term (current)  use of anticoagulants, Z79.01  8. Cardiac arrest, history of, Z86.74  9. Unspecified atrial fibrillation, I48.91  10. Arrhythmia-PVCs symptomatic, I49.3  __  ALLERGIES     Erythromycin Base, Rash  Tikosyn, Intolerance-unknown  __  MEDICATIONS     1. Fosamax 70  mg tablet, 1 po qweek  2. Align 4 mg capsule, Take as Directed  3. gabapentin 100 mg capsule, 1 po tid  4. magnesium 250 mg tablet, 1 po qd  5. Tylenol Extra Strength 500 mg tablet, 1 po tid  6. multivitamin capsule, 1 po qd  7.  Synthroid 112 mcg tablet, 1 po qd  8. Vitron-C 65 mg iron-125 mg tablet,delayed release, 2 x per week  9. Citracal + D Slow Release 600 mg calcium-500 unit tablet,ext.release, 2 po qd  10. simvastatin 10 mg tablet, 1 po qhs  11. CoQ-10  100 mg capsule, 250 mg per pt. 1 po qd  12. fluticasone 100 mcg/actuation blister powder for inhalation, Take as Directed  13. carvedilol 12.5 mg tablet, 1 po bid  14. Lasix 40 mg tablet, 1 po qd  15. potassium chloride ER 10 mEq tablet,extended  release, bid  16. Xarelto 20 mg tablet, 1 po qd  __  CHIEF COMPLAINT/REASON FOR VISIT  Followup of Cardiomyopathy dilated, Followup of CHF  chronic systolic, Followup of Mitral valve insufficiency nonrheumatic and Followup of Shortness of breath  __  HISTORY OF PRESENT ILLNESS  Mrs.  [name] comes in for clinical followup. I spoke with her between the last visit and the present one. Unfortunately, when she discontinued metolazone, she started to gain weight and  correspondingly, her BNP level rose from 526 back up to 1261. She took metolazone  in addition to her furosemide yesterday and today, and her weight is back down. She does seem to be quite dependent currently on metolazone therapy and we discussed taking it on a more scheduled basis at least three times weekly. She is going to have  a repeat echocardiogram and structural heart visit with Dr. Garlan Fair. This was discussed between Dr. Amado Coe and Dr. Garlan Fair after the last electrophysiology followup. I agree that her main problem now is related to underlying persistent congestive heart failure.  Her last echo May 19, 2016, showed a visually estimated ejection fraction of 25% with mild MR, moderate LA enlargement and a medium sized atrial septal defect with left to right shunt. On that study, her systolic right-sided pressure was actually  normal.  __  PAST HISTORY     Past Medical Illnesses : celiac disease, Chronic back pain, Intermittent steroid spinal injections, COPD;  Past Cardiac Illnesses : Chest pain 2007, arrythmia 2007, PFO, Atrial fibrillation-persistent, Mitral regurgitation; Infectious Diseases: Staph infection at age 52 -88, Chicken  Pox; Surgical Procedures: Tonsilectomy,Apandectomy , Knee surgery 2002, total R knee replacement; Trauma  History: whip lash 1980; Cardiology Procedures-Invasive: Echo 08, Stress test 08, Cardiac Cath March 2014,  Cardioversion May 2015, Cardioversion  July 2015, Cardioversion for Afib August 2016, Cardiac Cath (left and right) October 2017, St Jude dual chamber ICD 05/25/16; Cardiology Procedures-Noninvasive : Echocardiogram June 2010, CT scan 01/27/07, Treadmill Stress Test, MPI (Nuclear) Study January 2013, Echocardiogram January 2013, TEE February 2013, Echocardiogram December 2013, TEE January 2014, Echocardiogram February 2015, Holter Monitor July 2015,  Echocardiogram November 2017; Cardiac Cath Results: no significant obstructive CAD, mild pulm HTN, R to L shunt with Qp:Qs  1.6, LVEDP ;  Left Ventricular Ejection Fraction: LVEF of 25% documented via echocardiogram on 05/19/2016  SOCIAL HISTORY     Alcohol Use: Does not use alcohol; Smoking: Does not smoke; Never smoker ([id number]);  Diet: Regular diet and Caffeine use-1-2 per day; Lifestyle: Married;  Exercise: Exercises occasionally;   __  PHYSICAL EXAMINATION     Vital Signs:  Blood Pressure:  126/52 Sitting, Left arm, regular cuff  126/58 Sitting, Right arm,  regular cuff    Weight: 134.00 lbs.  Height: 64"   BMI: 23   Pulse: 60/min.        Constitutional: Well developed, well nourished, in no acute distress Skin: warm and dry to touch  Head: normocephalic, normal female hair pattern Eyes:  conjunctivae and lids unremarkable, EOMS intact ENT: No pallor or cyanosis  Neck: JVD present, carotid pulses are full and equal bilaterally Chest : Normal symmetry, clear to auscultation bilaterally, normal respiratory effort, ICD site left infraclavicular area Cardiac: Regular rhythm, S1 normal,  S2 normal, No S3 or S4, 2/6 systolic murmur LSB, with ectopic beats Abdomen: bowel sounds normoactive, no bruits  Peripheral Pulses: pulses full and equal in all extremities Extremities/Back : 1+ bilateral ankle edema Psychiatric: normal memory  Neurological: oriented to time, person and place, affect appropriate   __    Medications added  today by the physician:        IMPRESSIONS:   1. Acute on chronic systolic congestive heart  failure, now fairly dependent on use of metolazone three   times weekly, in addition to higher dose furosemide.  2. Nonischemic cardiomyopathy with ejection fraction 25% on echo May 19, 2016, down from   35% one month previously.   3. Transseptal ASD with a left-to-right shunt and to Qp:Qs 1.6 by prior study.  4. Cardiac arrest, presumably due to prolonged QT with Tikosyn therapy on May 19, 2016   (torsades type). Tikosyn discontinued and ICD implanted. Last EP followup  demonstrated no   evidence of atrial  fibrillation since implant.  5. Recurrent persistent atrial fibrillation, now in sinus rhythm with prior failure of amiodarone and   extensive left and right atrial ablation and pulmonary vein isolation  with FIRM-guided bitemporal   dispersion ablation in September 2016.  6. Moderate to severe mitral valve regurgitation and biatrial enlargement in the past. Right and left heart   catheterization on April 16, 2016: Mean PA peak 28 mmHg.  No significant obstructive CAD.  7. 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 Stacy Gardner.  8. Chronic Xarelto anticoagulation.  9. History of celiac disease.  10. Chronic anemia.  11. Kyphoscoliosis with  component of restriction.  12. Hypothyroidism, on Synthroid.    RECOMMENDATIONS:   1. The patient is going to take metolazone half an hour  prior to furosemide, approximately three times   weekly and monitor her daily weights. Her furosemide dose has been increased to 40 mg daily.  2. She is going to take 20 mEq of potassium daily and repeat labs in about three  weeks.  3. She  is to return for an echocardiogram.  4. She has an appointment with Dr. Garlan Fair next week for consideration of ASD closure. She may really   benefit from right heart catheterization to get a better sense of what is precipitating her heart failure.   5. She does have enough blood pressure now to consider a trial of ACE inhibitor as well. In the past, she   has been relatively hypotensive, precluding addition.    Encarnacion Slates, MD     Tid: [id number]    cc: Bryon Lions MD  Danford Bad MD    eca  ____________________________   Christianne Dolin  Return Visit 30 MIN 1 month  2D, color flow, doppler First Available  Structural/TAVR Consult 4/18, 3pm First Available  Basic Metabolic Panel 3 weeks

## 2020-09-24 NOTE — Progress Notes (Signed)
[facility] OFFICE  [address] [place], [place] [address]     [name], [place]    Date of Visit:  09/18/2016  Date of Birth: 1932-05-30  Age: 87 yrs.   Medical Record Number: [id number]  __  CURRENT DIAGNOSES     1. Mitral valve insufficiency nonrheumatic,  I34.0  2. 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 Unspecified atrial fibrillation, I48.91  4. CHF chronic systolic, I50.22  5. Atrial Septal Defect, Q21.1  6. Shortness of breath, R06.02  7. Long term (current) use of anticoagulants,  Z79.01  8. Cardiac arrest, history of, Z86.74  __  ALLERGIES    Erythromycin Base, Rash   Tikosyn, Intolerance-unknown  __  MEDICATIONS     1. Fosamax 70 mg tablet, 1 po qweek  2.  Align 4 mg capsule, Take as Directed  3. gabapentin 100 mg capsule, 1 po tid  4. magnesium 250 mg tablet, 1 po qd  5. Tylenol Extra Strength 500 mg tablet, 1 po tid  6. multivitamin capsule, 1 po qd  7. Synthroid 112 mcg tablet, 1  po qd  8. Vitron-C 65 mg iron-125 mg tablet,delayed release, 2 x per week  9. Citracal + D Slow Release 600 mg calcium-500 unit tablet,ext.release, 2 po qd  10. simvastatin 10 mg tablet, 1 po qhs  11. CoQ-10 100 mg capsule, 250 mg per  pt. 1 po qd  12. fluticasone 100 mcg/actuation blister powder for inhalation, Take as Directed  13. Xarelto 20 mg tablet, qd  14. carvedilol 12.5 mg tablet, 1 po bid  15. Lasix 20 mg tablet, 1 po bid  16. metolazone 2.5 mg tablet,  Take as Directed 1 pill 30 min before morning lasix  17. potassium chloride ER 10 mEq tablet,extended release, 2 po qd with lasix  __  CHIEF COMPLAINT/REASON FOR VISIT   Followup of CHF chronic systolic  __  HISTORY OF PRESENT ILLNESS  [name] is an 87 year old female who returns to the office today for  followup. She was seen by Dr. Michelle Piper one week ago. At that time, she had significant volume overload. Her Lasix was increased to twice daily. She had labs done on September 14, 2016, which showed a BNP of 1300. When Dr. Talmadge Chad spoke to the patient,   the patient noted that she has not diuresed with the increased Lasix. Metolazone was then added with a five pound diuresis in the last three days. [name] notes an improvement in her dyspnea. She denies any chest pain, pressure, tightness, orthopnea,  dizziness, presyncope or syncope.  __  PAST HISTORY     Past Medical Illnesses : celiac disease, Chronic back pain, Intermittent steroid spinal injections, COPD;  Past Cardiac Illnesses : Chest pain 2007, arrythmia 2007, PFO, Atrial fibrillation-persistent, Mitral regurgitation; Infectious Diseases: Staph infection at age 5 -89, Chicken  Pox; Surgical Procedures: Tonsilectomy,Apandectomy , Knee surgery 2002, total R knee replacement; Trauma  History: whip lash 1980; Cardiology Procedures-Invasive: Echo 08, Stress test 08, Cardiac Cath March 2014,  Cardioversion May 2015, Cardioversion July 2015, Cardioversion for Afib August 2016, Cardiac Cath (left and right) October 2017, St Jude dual chamber ICD 05/25/16; Cardiology Procedures-Noninvasive : Echocardiogram June 2010, CT scan 01/27/07, Treadmill Stress Test, MPI (Nuclear) Study January 2013, Echocardiogram January 2013, TEE February 2013, Echocardiogram December 2013, TEE January 2014, Echocardiogram February 2015, Holter Monitor July 2015,  Echocardiogram November 2017; Cardiac Cath Results: no significant obstructive CAD, mild pulm HTN, R to L shunt with Qp:Qs 1.6, LVEDP ;  Left Ventricular Ejection Fraction: LVEF of 25% documented via echocardiogram on 05/19/2016     CURRENT CATH RESULTS  Date of Study or Procedure:    Study/Procedure Performed:     LV: LMT:  LAD: DIAG1:  DIAG2: LCFX: OM1:  OM2: RCA:  PDA: PLB: AM:  LIMA: RIMA:  __   PHYSICAL EXAMINATION    Vital Signs:  Blood Pressure:   150/72 Sitting, Left arm, regular cuff  146/72 Sitting, Right arm, regular cuff    Weight: 137.80 lbs.   Height: 65"  BMI: 23   Pulse:  76/min.       Constitutional: Well developed, well nourished, in no acute distress   Skin: warm and dry to touch Head: normocephalic, normal  female hair pattern Eyes: conjunctivae and lids unremarkable, EOMS intact ENT : No pallor or cyanosis Neck: JVD present  Chest: Normal symmetry, clear to auscultation bilaterally, normal respiratory effort, ICD site left infraclavicular area Cardiac : Regular rhythm, S1 normal, S2 normal, No S3 or S4, 2/6 systolic murmur LSB, with ectopic beats Abdomen: bowel sounds normoactive, no bruits  Peripheral Pulses: pulses full and equal in all extremities Extremities/Back : 2+ bilateral calf edema Psychiatric: normal memory  Neurological: oriented to time, person and place, affect appropriate   __    Medications added  today by the physician:  metolazone 2.5 mg tablet, Take as Directed 1 pill 30 min before morning lasix, 10      IMPRESSIONS:    1. Acute-on-chronic systolic congestive heart failure. She did not diurese with increase in Lasix.   She was then given metolazone for 3 days and she has lost five pounds.   2. Nonischemic cardiomyopathy with EF 25% on last echo May 19, 2016.  3. Transseptal ASD with left to right shunt QP to QS 1.6.  4. Cardiac arrest, presumably due to prolonged QT with Tikosyn therapy May 19, 2016   (torsades type). Tikosyn discontinued and ICD implanted.  5. Recurrent persistent  atrial fibrillation with failure of amiodarone and extensive left and right atrial   ablation and pulmonary vein isolation with FIRM-guided bitemporal dispersion ablation in   September 2016. Anticoagulated with Xarelto.   6. Severe DLCO abnormality  with moderate obstructive disease followed by Dr. Stacy Gardner.  7. Moderate to severe mitral valve regurgitation with biatrial enlargement and severe tricuspid   regurgitation. Right and left heart catheterization, April 16, 2016: Mean PA peak 28  mmHg.   No significant obstructive CAD.  8. Chronic anemia.  9. Kyphoscoliosis with component of restriction.    RECOMMENDATIONS:    1. She will take metolazone for  two more days.   2. Continue Lasix 20 mg BID.   3. Increase potassium o 40 mg QD and increase potassium in diet.   4. Repeat BMP and BNP in 3 days.   5. Continue daily weights.   6. Follow-up in one  week to reassess volume status.     Florence Canner. Steva Colder, NP-C     Tid: [id number]    cc: Danford Bad MD     eca  ____________________________  TODAYS ORDERS  Basic Metabolic Panel 3 days  Brain Natriuretic Peptide 3 days  Return Visit 15 MIN 1  week

## 2020-09-24 NOTE — Progress Notes (Signed)
[facility]  [address] [place], [place] [address]     [name], [place]    Date of Visit:  06/30/2016  Date of Birth: 1931-08-11  Age: 87 yrs.   Medical Record Number: [id number]  __  CURRENT DIAGNOSES     1. Cardiomyopathy dilated, I42.0   2. Unspecified atrial fibrillation, I48.91  3. Long term (current) use of anticoagulants, Z79.01  4. Cardiac arrest, history of, Z86.74  5. Mitral valve insufficiency nonrheumatic, I34.0  6. CHF acute on chronic diastolic, I50.33   7. Atrial Septal Defect, Q21.1  8. Shortness of breath, R06.02  __  ALLERGIES    Erythromycin  Base, Rash  Tikosyn, Intolerance-unknown  __  MEDICATIONS     1. Fosamax 70 mg tablet, 1  po qweek  2. Align 4 mg capsule, Take as Directed  3. gabapentin 100 mg capsule, 1 po tid  4. magnesium 250 mg tablet, 1 po qd  5. Tylenol Extra Strength 500 mg tablet, 1 po tid  6. multivitamin capsule, 1 po qd  7. Synthroid 112  mcg tablet, 1 po qd  8. Vitron-C 65 mg iron-125 mg tablet,delayed release, 2 x per week  9. Citracal + D Slow Release 600 mg calcium-500 unit tablet,ext.release, 2 po qd  10. simvastatin 10 mg tablet, 1 po qhs  11. CoQ-10 100 mg capsule,  250 mg per pt. 1 po qd  12. fluticasone 100 mcg/actuation blister powder for inhalation, Take as Directed  13. Lasix 20 mg tablet, 1 po qd  14. Tikosyn 250 mcg capsule, 1 PO BID  15. lisinopril 5 mg tablet, 1/2 po qd 2.5 mg qd  16.  Xarelto 20 mg tablet, qd  17. Coreg 12.5 mg tablet, bid  __  CHIEF COMPLAINT/REASON FOR VISIT  fu VT, AF, St. Jude ICD  __   HISTORY OF PRESENT ILLNESS  The patient is an 87 year old female with a history of persistent atrial fibrillation with failure of amiodarone therapy. She underwent complex atrial fibrillation ablation  in March of 2017 and was put on Tikosyn during the window period. She had progressive decline of her LV function with functional mitral regurgitation and she suffered an out of hospital cardiac arrest with full neurologic recovery.  While torsades de pointes  from Tikosyn therapy was considered as the etiology of her progressive LV dysfunction and ejection fraction of 35%, a secondary prevention ICD was recommended. She underwent implantation of a dual-chamber St. Jude ICD on December 4 and is now here for  followup. Since her discharge, she has continued to be in rehab. After cardiac arrest, she unfortunately had a femoral fracture and underwent a surgical repair with open repair with internal fixation. She continues to work mostly in a wheelchair as well  as with a cane in recovery. She has not had any palpitations, presyncope, frank syncope, or symptoms of overt atrial fibrillation. All other review of systems is negative.   __  PAST HISTORY      Past Medical Illnesses: celiac disease, Chronic back pain, Intermittent steroid spinal injections, COPD;   Past Cardiac Illnesses: Chest pain 2007, arrythmia 2007, PFO, Atrial fibrillation-persistent, Mitral regurgitation; Infectious Diseases : Staph infection at age 81 -84, Chicken Pox; Surgical Procedures: Tonsilectomy,Apandectomy , Knee surgery 2002, total R knee replacement;  Trauma History: whip lash 1980; Cardiology Procedures-Invasive: Echo 08, Stress test 08, Cardiac Cath March  2014, Cardioversion May 2015, Cardioversion July 2015, Cardioversion for Afib August 2016, Cardiac Cath (left and right) October  2017, St Jude dual chamber ICD 05/25/16; Cardiology Procedures-Noninvasive : Echocardiogram June 2010, CT scan 01/27/07, Treadmill Stress Test, MPI (Nuclear) Study January 2013, Echocardiogram January 2013, TEE February 2013, Echocardiogram December 2013, TEE January 2014, Echocardiogram February 2015, Holter Monitor July 2015;  Cardiac Cath Results: no significant obstructive CAD, mild pulm HTN, R to L shunt with Qp:Qs 1.6, LVEDP ; Left Ventricular Ejection Fraction : LVEF of 50% documented via echocardiogram on 03/06/2016  PHYSICAL EXAMINATION    Vital Signs :  Blood Pressure:  102/64  Sitting, Left arm, regular cuff  106/56 Sitting, Right arm, regular cuff     Weight: 140.00 lbs.  Height: 65"  BMI:  23   Pulse: 60/min.       Constitutional:  Well developed, well nourished, in no acute distress Skin: warm and dry to touch Head : normocephalic, normal female hair pattern Eyes: conjunctivae and lids unremarkable, EOMS intact  ENT: No pallor or cyanosis Neck:  carotid pulses are full and equal bilaterally, no JVD, no bruits Chest: Normal symmetry, clear to auscultation bilaterally, no use of accessory muscles,  normal respiratory effort, ICD site healing well Cardiac: Regular rhythm, S1 normal, S2 normal, frequent ectopic beats  Abdomen: bowel sounds normoactive, no bruits Peripheral Pulses:  pulses full and equal in all extremities Extremities/Back: trace bilateral ankle edema Psychiatric : normal memory Neurological: oriented to time, person  and place, affect appropriate   __    Medications added today by the physician:       IMPRESSIONS:  1. Recurrent persistent atrial fibrillation, with failure of amiodarone with extensive left and right atrial ablation   and pulmonary vein isolation and FIRM-guided bitemporal dispersion  ablation in September 2016. She   had been on Tikosyn in the past and then put on Tikosyn during the window period.   2. Progressive decline in left ventricular function with ejection fraction dropped to 35% with PFO with   bidirectional shunt  and severe pulmonary hypertension.   3. Scoliosis.   4. 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 Stacy Gardner.     ASSESSMENT AND PLAN:   It was a pleasure to see the patient in followup. She has clearly had a rocky last number of weeks and continues to be on the road to recovery. Her ICD was interrogated today, showing stable and adequate lead parameters. She has been in sinus rhythm since  device implantation with only brief mode switches for PAT lasting a number of seconds. She has had no ventricular  events. Her P wave sensing is 2.1 millivolts, R wave sensing 11.6 millivolts, capture threshold of 1 volt and 0.5 volts at 0.5 msec with  a pacing impedance of 410 and 400 ohms. She met with a member of our cardiac monitoring center and will do routine device care with a device check every three months remotely and an in-person check every year. The etiology of her event is unclear. She  has had a progressive decline of her LV function and is currently on guideline-directed medical therapy with an ACE inhibitor and beta blockade. I would like to reappraise her ejection fraction in three months' time. It is unclear if this was precipitated  by the Tikosyn and certainly this will continue to be held. Fortunately, she is maintaining a normal rhythm. She will remain on lifelong systemic anticoagulation. I asked her to follow up with Dr. Talmadge Chad in four to six weeks' time and I will plan to  see her in three  months' time to check in.     Jarvis Morgan Amado Coe, MD    ASF/tumam    cc: Danford Bad MD     eca  ____________________________  Christianne Dolin  EP Return Visit 3 months  Return Visit 15 MIN 6 weeks

## 2020-09-24 NOTE — Progress Notes (Signed)
[facility] OFFICE  [address] [place], [place] [address]     [name], [place]    Date of Visit:  02/19/2016  Date of Birth: 27-Jan-1932  Age: 87 yrs.   Medical Record Number: [id number]  __  CURRENT DIAGNOSES     1. Mitral valve insufficiency nonrheumatic,  I34.0  2. Unspecified atrial fibrillation, I48.91  3. Shortness of breath, R06.02  4. Long term (current) use of anticoagulants, Z79.01  __  ALLERGIES     Erythromycin Base, Rash  __  MEDICATIONS     1. Fosamax 70 mg tablet, 1 po qweek  2. Align  4 mg capsule, Take as Directed  3. gabapentin 100 mg capsule, 1 po tid  4. magnesium 250 mg tablet, 1 po qd  5. Tylenol Extra Strength 500 mg tablet, 1 po tid  6. multivitamin capsule, 1 po qd  7. lisinopril 5 mg tablet, 1 po qd   8. Synthroid 112 mcg tablet, 1 po qd  9. Vitron-C 65 mg iron-125 mg tablet,delayed release, 2 x per week  10. Lasix 20 mg tablet, 1 po qod  11. Citracal + D Slow Release 600 mg calcium-500 unit tablet,ext.release, 2 po qd  12. simvastatin  10 mg tablet, 1 po qhs  13. CoQ-10 100 mg capsule, 250 mg per pt. 1 po qd  14. warfarin 5 mg tablet, 1 to 1.5 po qd or as directed by  [facility]  __  CHIEF COMPLAINT/REASON FOR VISIT   Followup of Mitral valve insufficiency nonrheumatic, Followup of Shortness of breath and Followup of Unspecified atrial fibrillation  __  HISTORY OF PRESENT ILLNESS   [name] comes in for clinical followup one day ahead of her 84nd birthday. She has had a lot happen since I saw her last. She had a bout with an upper respiratory infection and bronchitis with COPD exacerbation in March 2017. She recovered from that  and then had right knee replacement surgery in April 2017. Following that she has been seeing Dr. Amado Coe intermittently for her management of her atrial fibrillation. She is scheduled for atrial fibrillation ablation February 26, 2016. She has been noticing  increasing dyspnea on exertion, but fortunately does not describe PND, orthopnea or pedal edema.  Her husband is developing signs of early stage Alzheimer's, and she is trying to ensure that she is in optimum health.   __   PAST HISTORY     Past Medical Illnesses: celiac disease, Chronic back pain, Intermittent steroid spinal  injections, COPD;  Past Cardiac Illnesses: Chest pain 2007, arrythmia 2007, PFO, Atrial fibrillation-persistent,  Mitral regurgitation; Infectious Diseases: Staph infection at age 81 -47, Chicken Pox; Surgical Procedures : Tonsilectomy,Apandectomy , Knee surgery 2002, total R knee replacement; Trauma History: whip lash 1980;  Cardiology Procedures-Invasive: Echo 08, Stress test 08, Cardiac Cath March 2014, Cardioversion May 2015, Cardioversion July 2015, Cardioversion for Afib August 2016;  Cardiology Procedures-Noninvasive: Echocardiogram June 2010, CT scan 01/27/07, Treadmill Stress Test, MPI (Nuclear) Study January 2013, Echocardiogram January 2013, TEE February 2013, Echocardiogram December  2013, TEE January 2014, Echocardiogram February 2015, Holter Monitor July 2015; Cardiac Cath Results: 08/2012: EF 45%; RCA 20% stenosis in prox.; mild pulm.  htn, no significant obstructive CAD.; Left Ventricular Ejection Fraction: LVEF of 60% documented via echocardiogram on 07/28/2013   SOCIAL HISTORY    Alcohol Use: Does not use alcohol;  Smoking: Does not smoke; Never smoker ([id number]); Diet: Regular diet and Caffeine use-1-2 per day;  Lifestyle: Married; Exercise: Exercises occasionally;  __   PHYSICAL EXAMINATION    Vital Signs:  Blood Pressure:   140/80 Sitting, Left arm, regular cuff  140/80 Sitting, Right arm, regular cuff    Weight: 141.40 lbs.   Height: 65"  BMI: 23   Pulse:  88/min. Apical Regular       Constitutional: Well developed, well nourished, in no acute distress  Skin: warm and dry to touch Head: normocephalic, normal  female hair pattern Eyes: conjunctivae and lids unremarkable, EOMS intact ENT : No pallor or cyanosis Neck: carotid pulses are full  and equal bilaterally, no  JVD, no bruits Chest: Normal symmetry, clear to auscultation bilaterally  Cardiac: irregular rrhythm, S1 normal, S2 normal, No S3 or S4, Apical impulse not displaced, no murmurs, gallops or rubs detected. Abdomen : bowel sounds normoactive, no bruits Peripheral Pulses: pulses full and equal in all extremities  Extremities/Back: Normal muscle strength and tone., no edema present, kyphosis present, scoliosis present Psychiatric : normal memory Neurological: oriented to time, person  and place, affect appropriate   __    Medications added today by the physician:  warfarin 5 mg tablet, 1 po qd or as directed by [facility], 135   warfarin 5 mg tablet, 1 to 1.5 po qd or as directed by [facility], 135    IMPRESSIONS:   1. Multifactorial dyspnea, improved after restoration of  sinus rhythm, but now recurrent back in   atrial fibrillation. Moderate chronic obstructive pulmonary disease as well as restriction followed   by Dr. Stacy Gardner.  2. Persistent atrial fibrillation, failed Tikosyn and amiodarone discontinued  due to concern of a   pulmonary toxicity long term. She had recurrent atrial fibrillation as well. Anticipating atrial   fibrillation ablation February 26, 2016.  3. Moderate-to-severe mitral regurgitation with biatrial enlargement and moderate-to-severe    tricuspid regurgitation on echo February 2015, markedly improved after restoration of sinus   rhythm on echo February 2016. Last echo, September 11, 2015: Ejection fraction 55% with return   of moderate mitral and tricuspid regurgitation.  4.  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 Stacy Gardner.  5. Transesophageal echo 2014: Etiology of mitral regurgitation was anular dilatation.  6. Cardiac catheterization September 08, 2012: Normal coronary arteries with  relatively compensated   hemodynamics and only moderate mitral regurgitation.  7. Kyphoscoliosis with some component of restriction in activity due to back pain.  8. Right-sided neck  fullness with remote negative CT of the neck in 2008.   9. Normal CT of the abdomen and pelvis in 2009 with history of celiac disease.  10. Anemia, followed by Dr. Pecola Leisure.  11. Hypothyroidism, on Synthroid.    RECOMMENDATIONS:    1. The patient is going to continue on her current regimen and will proceed as planned for her   atrial fibrillation ablation. Hopefully restoration of sinus rhythm will go a long ways towards   improving her quality of life and current  symptomatology.  2. Return general cardiology followup again in six months or earlier for development of new   symptoms or questions.    Encarnacion Slates, MD     Tid: [id number]:    cc: Bryon Lions MD  Danford Bad MD    EL   ____________________________  TODAYS ORDERS  PT/INR 2 days  12 Lead ECG Today  Return Visit 15 MIN 6 months

## 2020-09-24 NOTE — Progress Notes (Signed)
[facility]  [address] [place], [place] [address]     [name], [place]    Date of Visit:  09/17/2015  Date of Birth: 1931/07/27  Age: 87 yrs.   Medical Record Number: [id number]  __  CURRENT DIAGNOSES     1. Mitral valve insufficiency nonrheumatic,  I34.0  2. Unspecified atrial fibrillation, I48.91  3. Shortness of breath, R06.02  4. Long term (current) use of anticoagulants, Z79.01  __  ALLERGIES     Erythromycin Base, Rash  __  MEDICATIONS     1. Fosamax 70 mg tablet, 1 po qweek  2. Align  4 mg capsule, Take as Directed  3. gabapentin 100 mg capsule, 1 po tid  4. magnesium 250 mg tablet, 1 po qd  5. Tylenol Extra Strength 500 mg tablet, 1 po tid  6. multivitamin capsule, 1 po qd  7. Claritin 10 mg tablet, 1 po qd   8. lisinopril 5 mg tablet, 1 po qd  9. Synthroid 112 mcg tablet, 1 po qd  10. Vitron-C 65 mg iron-125 mg tablet,delayed release, 2 x per week  11. Lasix 20 mg tablet, 1 po qod  12. Citracal + D Slow Release 600 mg calcium-500 unit tablet,ext.release,  2 po qd  13. CoQ-10 100 mg capsule, 2 po qd  14. Warfarin 5 Mg Tablet, 1 po qd or as directed by [facility]  15. benzonatate 100 mg capsule, 1 po qd  16. simvastatin 10 mg tablet, 1 po qhs  __   CHIEF COMPLAINT/REASON FOR VISIT  fu AF  __  HISTORY OF PRESENT ILLNESS  The patient is an 87 year old  female with a history of persistent atrial fibrillation, hypothyroidism, celiac disease and severe valvular disease. She failed Tikosyn therapy and has been on amiodarone since June of 2015. Prior to maintaining normal rhythm it was unclear what component  of her dyspnea was secondary to atrial fibrillation versus her moderate tricuspid regurgitation and aortic and mitral valve disease with underlying pulmonary disease. However, with maintenance of normal rhythm she has had significant improvement of symptomatology.  In the summer of 2016 she unfortunately had her first AFib breakthrough on amiodarone and underwent a cardioversion. She  was maintaining normal rhythm during her last followup in October and now returns for followup today. The patient describes over the  last two months she has been more dyspneic with activity. She has an upcoming surgery scheduled for knee replacement. She has had improvement of her symptoms of cough. She has not had hemoptysis. She has noted an irregular heartbeat but no tachycardia.  All other review of systems is negative.   __  PAST HISTORY     Past Medical Illnesses : celiac disease, Chronic back pain, Intermittent steroid spinal injections, COPD;  Past Cardiac Illnesses : Chest pain 2007, arrythmia 2007, PFO, Atrial fibrillation-persistent, Mitral regurgitation; Infectious Diseases: Staph infection at age 24 -62, Chicken  Pox; Surgical Procedures: Tonsilectomy,Apandectomy , Knee surgery 2002; Trauma History : whip lash 1980; Cardiology Procedures-Invasive: Echo 08, Stress test 08, Cardiac Cath March 2014, Cardioversion May 2015, Cardioversion July 2015, Cardioversion  for Afib August 2016; Cardiology Procedures-Noninvasive: Echocardiogram June 2010, CT scan 01/27/07, Treadmill Stress Test, MPI (Nuclear) Study January 2013,  Echocardiogram January 2013, TEE February 2013, Echocardiogram December 2013, TEE January 2014, Echocardiogram February 2015, Holter Monitor July 2015; Cardiac Cath Results : 08/2012: EF 45%; RCA 20% stenosis in prox.; mild pulm. htn, no significant obstructive CAD.; Left Ventricular Ejection Fraction: LVEF of  60% documented  via echocardiogram on 07/28/2013  PHYSICAL EXAMINATION    Vital Signs:   Blood Pressure:  120/74 Sitting, Left arm, regular cuff  124/76 Sitting, Right arm, regular cuff    Weight:  148.00 lbs.  Height: 65"  BMI: 24    Pulse: 70/min.       Constitutional: Well developed, well nourished, in no acute distress  Skin: warm and dry to touch Head: normocephalic, normal female hair pattern  Eyes: conjunctivae and lids unremarkable, EOMS intact ENT: No pallor or cyanosis  Neck:  carotid pulses are full and equal bilaterally, no JVD, no bruits Chest: Normal symmetry, clear  to auscultation bilaterally, kyphosis, cough Cardiac: irregular rrhythm, S1 normal, S2 normal, No S3 or S4, Apical impulse not displaced, no murmurs,  gallops or rubs detected. Abdomen: bowel sounds normoactive, no bruits Peripheral Pulses : pulses full and equal in all extremities Extremities/Back: Normal muscle strength and tone., no edema present, kyphosis present, scoliosis present  Psychiatric: normal memory Neurological: oriented to time, person and place, affect appropriate    __    Medications added today by the physician:        ECG:   ECG performed shows atrial fibrillation with controlled ventricular  rate with left anterior fascicular block.     IMPRESSIONS:   1. Persistent atrial fibrillation with failure of Tikosyn and now failure of amiodarone with her second  recurrence in the last six months.   2. Moderate to severe tricuspid  regurgitation, moderate mitral regurgitation, and moderate aortic  regurgitation with significant improvement and with left atrial remodeling with maintenance of  normal rhythm.  3. Severe DLCO abnormality with moderate obstructive disease,  followed by Dr. Stacy Gardner with  pulmonary function tests last performed in our system December 2015.  4. Scoliosis.     ASSESSMENT AND PLAN:   It was a pleasure to see the patient in followup. We again reviewed management of her atrial  fibrillation. Unfortunately she has had her second breakthrough on amiodarone in the last six months. Clearly it is not an effective medication for her. I have asked her to discontinue it. We discussed management options including a rate-control strategy  of atrial fibrillation, living with her symptomatology with dyspnea, versus further more aggressive attempts at maintaining normal rhythm. At this point with failure of Tikosyn and amiodarone, would next recommend atrial fibrillation ablation. We discussed  the  muted likelihood of effect given persistent atrial fibrillation and underlying valve disease. We discussed the steps of the procedure. For the time being she has upcoming knee replacement surgery in the coming weeks and will delay any decision making  until after she recovers from her surgery. We will continue her on Coumadin for thromboembolic risk reduction. From my standpoint there is no preoperative testing that is needed prior to upcoming orthopedic surgery and at this point the patient is fully  optimized for said surgery. She is on Coumadin and would be okay with being held for the few days prior to surgery and reinitiated once felt safe by the surgical team. We will plan to see the patient in two months' time but are always available sooner  as issues arise.     Jarvis Morgan Amado Coe, MD    ASF/tutlc     cc: Danford Bad MD    eca

## 2020-09-24 NOTE — Progress Notes (Signed)
[facility]  [address] [place], [place] [address]     [name], [place]    Date of Visit:  06/01/2017  Date of Birth: 02-18-1932  Age: 87 yrs.   Medical Record Number: [id number]  Referring Physician: Casimer Bilis MD, [place] J  __   CURRENT DIAGNOSES     1. Mitral valve insufficiency nonrheumatic, I34.0  2. 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 Atrial Fibrillation, Paroxysmal, I48.0  4. Atrial Fibrillation, Persistent,  I48.1  5. Arrhythmia-PVCs symptomatic, I49.3  6. CHF chronic systolic, I50.22  7. Atrial Septal Defect, Q21.1  8. Shortness of breath, R06.02  9. Device check automatic implantable cardiac defibrillator, Z45.02  10. Long term (current)  use of anticoagulants, Z79.01  11. Cardiac arrest, history of, Z86.74  12. Status post atrial septal defect repair, Z98.890  13. Poisoning By Loop Diuretics, Accidental, Subs, T50.1X1D  __   ALLERGIES    Erythromycin Base, Rash  Tikosyn, Intolerance-unknown  __   MEDICATIONS     1. Align 4 mg capsule, Take as Directed  2. amiodarone 200 mg tablet, 1 po daily  3. carvedilol 12.5 mg tablet, 1 po bid  4. Citracal + D Slow Release 600 mg calcium-500  unit tablet,ext.release, 2 po qd  5. coenzyme Q10 100 mg capsule, 1 po qd  6. fluticasone 100 mcg/actuation blister powder for inhalation, Take as Directed  7. Fosamax 70 mg tablet, 1 po qweek  8. gabapentin 100 mg capsule, 1 po tid   9. Lasix 40 mg tablet, 1 TAB po daily  10. losartan 25 mg tablet, 1/2 tab po qd, hold sbp less than 100  11. magnesium 200 mg tablet, 1 po qd  12. multivitamin capsule, 1 po qd  13. simvastatin 10 mg tablet, 1 po qhs  14. spironolactone  25 mg tablet, 1/2 tab po qd  15. Synthroid 112 mcg tablet, 1 po qd  16. Tylenol Extra Strength 500 mg tablet, 1 po tid  17. Vitron-C 65 mg iron-125 mg tablet,delayed release, 2 x per week  18. Xarelto 15 mg tablet, 1 po qd  __   CHIEF COMPLAINT/REASON FOR VISIT  Followup of Cardiomyopathy dilated  __  HISTORY OF PRESENT ILLNESS   Ms. [name]  is an 87 year old female well-known to me from longterm electrophysiology follow-up. She has a history of complex cardiac history including variable LV function with progressive mitral regurgitation. She has persistent atrial fibrillation  with both improvement of her LV function as well as mitral regurgitation with maintenance of sinus rhythm. Unfortunately she failed amiodarone and underwent a complex AFib ablation, September 2017. In December 2017, she had a cardiac arrest with secondary  prevention dual chamber [employer] Jude ICD implantation. She maintained normal rhythm off of antiarrhythmic drugs until April of 2018 where she began to have persistent atrial fibrillation with progressive decline of her LV function with most recent ejection  fraction 25% with moderate to severe regurgitation. She also was having shunting across an ASD which was closed without improvement of heart failure symptoms. She was seen in the hospital in October with worsening heart failure symptoms with 50% ventricular  pacing but is generally rate controlled. She was reprogrammed to minimize ventricular pacing and was initiated on amiodarone with plans for repeated cardioversion attempt. She unfortunately was cardioverted in the end of November after following the amiodarone  load but was unable to be restored to sinus rhythm with an evaluation of potential mitral valve mitral clipping from functional MR. A TEE  was performed which showed only moderate mitral regurgitation. She is now here to follow-up for further management.  In discussion with patient, she continues to get short of breath doing activity. Her weight has been down about three or four pounds with improvement of her lower extremity swelling. She has developed extensive bilateral lower extremity rash since starting  torsemide. All other review of systems is negative.  __  PAST HISTORY     Past Medical Illnesses : celiac disease, Chronic back pain, Intermittent steroid spinal  injections, COPD, hypothyroid;  Past Cardiac Illnesses : Chest pain 2007, arrythmia 2007, PFO, Atrial fibrillation-persistent, Mitral regurgitation, Cardiomyopathy, HFwREF, VT; Infectious Diseases: Staph infection  at age 32 -47, Chicken Pox; Surgical Procedures: Tonsilectomy,Apandectomy , Knee surgery 2002, total R knee replacement, ASD Repair;  Trauma History: whip lash 1980; Cardiology Procedures-Invasive: Echo 08, Stress test 08, Cardiac Cath March  2014, Cardioversion May 2015, Cardioversion July 2015, Cardioversion for Afib August 2016, Cardiac Cath (left and right) October 2017, St Jude dual chamber ICD 05/25/16, Cardiac Cath (right) May 2018, ASD closure 11/11/16, ASD closure 11/11/16;  Cardiology Procedures-Noninvasive: CT scan 01/27/07, Treadmill Stress Test, MPI (Nuclear) Study January 2013, TEE 07/2011, 06/2012, Holter Monitor July 2015, Echocardiogram 11/2008, 06/2011, 05/2012, 07/2013, 04/2016,  10/2016, 03/2017; Cardiac Cath Results: no significant obstructive CAD, mild pulm HTN, R to L shunt with Qp:Qs 1.6, LVEDP ;  Left Ventricular Ejection Fraction: LVEF of 25% documented via echocardiogram on 04/07/2017  PHYSICAL EXAMINATION     Vital Signs:  Blood Pressure:  114/66 Sitting, Left arm, regular cuff  110/62 Sitting, Right arm,  regular cuff    Weight: 127.00 lbs.  Height: 64"   BMI: 22   Pulse: 84/min. apical        Constitutional: thin frail chronically ill appearing,pale, using cane Skin: surgical scars well-healed,  warm and dry to touch, no apparent skin lesions, no apparent masses noted Head: normocephalic, no tenderness,  abnormal female hair pattern Eyes: pale conjunctivae ENT : Ears, Nose and throat reveal no gross abnormalities, No pallor or cyanosis Neck:  JVD present, carotid pulses are full and equal bilaterally, no bruits Chest: clear to auscultation bilaterally, Normal symmetry, no intercostal retraction,  no use of accessory muscles Cardiac: Irregularly irregular rhythym; normal S1 and S2.  Grade 2/6 SM @ [facility]  Abdomen: bowel sounds normoactive Peripheral Pulses:  bilateral radial pulse(s) 1+ Extremities/Back: 1+ bilateral pretibial edema Psychiatric : mild forgetfulness Neurological: oriented to time,  person and place, affect appropriate   __    Medications added today by the physician:  Lasix 40 mg tablet, 1 TAB po daily, 90  magnesium  200 mg tablet, 1 po qd, 60  torsemide 20 mg tablet, 1 po qd, 60    IMPRESSIONS:  1. Persistent atrial fibrillation with prior complex biatrial  ablation in 2017 but ultimate reversion   back to persistent atrial fibrillation, unable to cardiovert despite amiodarone therapy.  2. Progressive nonischemic cardiomyopathy with most recent ejection fraction of 25% with   moderate mitral  regurgitation.  3. Chronic systolic heart failure, class III symptoms.  4. Left bundle branch block.  5. Status post dual chamber secondary prevention ICD.  6. History of cardiac arrest, December 2017.  7. Gastrointestinal bleeding  in the past requiring transfusions with no source identified, tolerating   Xarelto. Kyphoscoliosis with moderate restrictive and severe DLCO abnormality, followed by   Dr. Stacy Gardner.    RECOMMENDATION:   It was a pleasure to see the patient in electrophysiology  follow-up. She has a nonischemic cardiomyopathy and despite guideline directed medical therapy with ongoing difficulty with chronic systolic class III heart failure symptoms, my hope is that we  would be able to restore sinus rhythm with amiodarone helping to improve heart failure symptoms, but unfortunately, even with amiodarone was not able to restore her into sinus rhythm. Given her LV dysfunction, heart failure and left bundle branch block,  I did discuss consideration of upgrading her device, and she wishes to do everything to improve her quality of life and wished to proceed. We will plan at the same time to repeat a cardioversion attempt to see if we help maintain normal rhythm.     She has developed  a diffuse bilateral rash since starting torsemide, and I will switch her to corresponding Lasix to see if that helps improve. She will be mindful of worsening heart failure with this medication change. We will plan to see the patient  at the time of her upcoming procedure.    Jarvis Morgan Amado Coe, MD    ASF/tubbh    cc: [place]  J ZIK MD  Danford Bad MD    shj  ____________________________   Christianne Dolin  ICD Bi-V Upgrade At [employer]

## 2020-09-24 NOTE — Progress Notes (Signed)
[facility] OFFICE  [address] [place], [place] [address]     [name], [place]    Date of Visit:  04/20/2018  Date of Birth: 07/29/1931  Age: 87 yrs.   Medical Record Number: [id number]  __  CURRENT DIAGNOSES     1. Mitral valve insufficiency nonrheumatic,  I34.0  2. 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 Atrial Fibrillation, Paroxysmal, I48.0  4. Arrhythmia-PVCs symptomatic, I49.3  5. CHF chronic systolic, I50.22  6. Atrial Septal Defect, Q21.1  7. Shortness of breath, R06.02  8.  Device check automatic implantable cardiac defibrillator, Z45.02  9. Long term (current) use of anticoagulants, Z79.01  10. Cardiac arrest, history of, Z86.74  11. Status post ICD, Z95.810  12. Status post atrial septal defect repair,  Z98.890  13. Carotid Bruit, R09.89  14. Poisoning By Loop Diuretics, Accidental, Subs, T50.1X1D  __  ALLERGIES     Erythromycin Base, Rash  Tikosyn, Intolerance-unknown  __  MEDICATIONS     1. Align 4 mg  capsule, Take as Directed  2. Amiodarone 200 Mg Tablet, 1 po daily  3. carvedilol 12.5 mg tablet, 1 po bid  4. Citracal + D Slow Release 600 mg calcium-500 unit tablet,ext.release, 2 po qd  5. coenzyme Q10 100 mg capsule, 1 po qd   6. fluticasone 100 mcg/actuation blister powder for inhalation, Take as Directed  7. gabapentin 100 mg capsule, 1 po tid  8. Lasix 40 mg tablet, 1/2 tab qd  9. losartan 25 mg tablet, 1 po qd  10. magnesium 200 mg tablet, 1 po qd  11.  multivitamin capsule, 1 po qd  12. potassium chloride ER 10 mEq capsule,extended release, 1 po qd  13. simvastatin 10 mg tablet, 1 po qhs  14. spironolactone 25 mg tablet, 1/2 tab po qd  15. Synthroid 112 mcg tablet, 1 po qd  16. Tylenol  Extra Strength 500 mg tablet, 1 po tid  17. Vitron-C 65 mg iron-125 mg tablet,delayed release, 2 x per week PRN  18. Xarelto 15 mg tablet, 1 po qd  __  CHIEF COMPLAINT/REASON FOR VISIT   Followup of Arrhythmia-PVCs symptomatic, Followup of Atrial Fibrillation, Paroxysmal, Followup of CHF chronic  systolic and Followup of Mitral valve insufficiency nonrheumatic  __  HISTORY OF PRESENT  ILLNESS  [name] comes in for clinical followup. She is still struggling with her situation at home. She is having a lot of stress related to caregiving for her sister-in-law. She has not been able  to participate in cardiac rehabilitation due to the schedule and requirement for primary care giving. She has no new symptoms of chest discomfort or dyspnea. She remains relatively deconditioned.  __   PAST HISTORY     Past Medical Illnesses: celiac disease, Chronic back pain, Intermittent steroid spinal  injections, COPD, hypothyroid, pure hypercholesterolemia, kyphoscoliosis;  Past Cardiac Illnesses: CHF  chronic systolic, Chest pain, arrythmia-PVCs symptomatic, PFO, Atrial fibrillation-persistent, Mitral regurgitation, Cardiomyopathy, HFwREF, VT, atrial septal defect, ICD; Infectious Diseases : Staph infection at age 32 -58, Chicken Pox; Surgical Procedures: Tonsilectomy,Apandectomy , Knee surgery 2002, total R knee replacement, ASD Repair;  Trauma History: whip lash 1980; Cardiology Procedures-Invasive: Echo 08, Stress test 08, Cardiac Cath March  2014, Cardioversion May 2015, Cardioversion July 2015, Cardioversion for Afib August 2016, Cardiac Cath (left and right) October 2017, St Jude dual chamber ICD 05/25/16, Cardiac Cath (right) May 2018, ASD closure 11/11/16, ASD closure 11/11/16, Pacemaker  implant January 2019; Cardiology Procedures-Noninvasive: CT scan 01/27/07, Treadmill Stress Test, MPI (  Nuclear) Study January 2013, TEE 07/2011, 06/2012, Holter  Monitor July 2015, Echocardiogram 11/2008, 06/2011, 05/2012, 07/2013, 04/2016, 10/2016, 03/2017, Echocardiogram April 2019; Cardiac Cath Results: no significant  obstructive CAD, mild pulm HTN, R to L shunt with Qp:Qs 1.6, LVEDP ; Left Ventricular Ejection Fraction: LVEF of 50% documented via echocardiogram  on 09/29/2017; Peripheral Vascular Procedures: Carotid NIVA September  2019; Peripheral Vasc Procedure  Results: 02/2018- R carotid 50% stenosis, L 50-69% stenosis, repeat 56yr.  SOCIAL HISTORY     Alcohol Use: Does not use alcohol; Smoking: Does not smoke; Never smoker ([id number]);  Diet: Regular diet and Caffeine use-1-2 per day; Lifestyle: Married, 2 sons - one in [place] and second  in [place]; Exercise: Exercises occasionally;   __  PHYSICAL EXAMINATION     Vital Signs:  Blood Pressure:  124/78 Sitting, Left arm, regular cuff  118/76 Sitting, Right arm, regular cuff     Weight: 132.60 lbs.  Height: 64"  BMI:  22.76   Pulse: 68/min. Apical Regular       Constitutional:  Well developed, chronically ill appearing, in no acute distress, walking with cane Skin: surgical scars well-healed, warm and dry to touch, erythematous  fingertips Head: normocephalic, normal female hair pattern Eyes : pale conjunctivae ENT: No pallor or cyanosis Neck : JVP normal, no carotid bruit, thyroid not enlarged Chest: Normal symmetry, kyphosis, clear to auscultation bilaterally  Cardiac: Regular rhythm, S1 normal, S2 normal, No S3 or S4, soft mid systolic murmur grade 2/6 heard best at the apex Abdomen : bowel sounds normoactive Peripheral Pulses: bilateral radial pulse(s) 1+ Extremities/Back : 1+ bilateral pretibial edema, kyphosis present Psychiatric: mild forgetfulness Neurological : oriented to time, person and place, affect appropriate   __    Medications added today by the physician:  Amiodarone 200 Mg Tablet, 1 po daily, 90      IMPRESSIONS:   1. Chronic dyspnea on exertion without overt physical  signs of decompensation with reduction   in furosemide dose.  2. Chronic systolic heart failure with LVEF up to 50% after ICD upgrade. Prior ejection fraction   on TEE June 16, 2017, was 22%.  3. Mild renal insufficiency with mild  anemia.   4. Moderate mitral regurgitation with moderate pulmonary hypertension, improved to mild mitral   regurgitation and pulmonary hypertension on last echo, 09/29/17.  5. Paroxysmal  atrial fibrillation, previously persistent but in sinus  rhythm after cardioversion   during biventricular ICD upgrade June 28, 2017. Prior failure of amiodarone with subsequent   extensive left and right atrial ablation. Focal impulse and rotor modulation-guided bitemporal   dispersion ablation  September 2017.  6. Xarelto initiated for anticoagulation. Current creatinine clearance is 24 mL per minute (reduced   dose for creatinine clearance 15-50 mL per minute).  7. Atrial septal defect, status post closure Nov 11, 2016. She completed  three months of Plavix.  8. Severe cardiomyopathy with left bundle branch block and heart failure resulting in biventricular  ICD upgrade June 28, 2017. Last interrogation remotely January 31, 2018.  9. Ventricular fibrillation arrest in  the setting of new cardiomyopathy while on Tikosyn, status post   original ICD implant December 2017.  10. Cardiac catheterization showing no significant coronary disease, Nov 11, 2017.  11. Kyphoscoliosis with moderate restriction. Severe  DLCO abnormality followed by Dr. Stacy Gardner.  12. Hypothyroidism.  13. Intolerance of ACE inhibitor previously due to hypotension.    RECOMMENDATION:   1. Continue current regimen. Her situation is unlikely to change  until she can be liberated  from her high frequency of caregiving.  2. Return clinical followup in four months.  3. Repeat carotid Doppler, September 2020.    Encarnacion Slates, MD     Tid: [id number]    cc: Danford Bad MD  ____________________________   Christianne Dolin  Return Visit 15 MIN 4 months

## 2020-09-24 NOTE — Progress Notes (Signed)
[facility]  [address] [place], [place] [address]     [name], [place]    Date of Visit:  05/04/2017  Date of Birth: 02-Apr-1932  Age: 87 yrs.   Medical Record Number: [id number]  Referring Physician: Casimer Bilis MD, [place] J  __   CURRENT DIAGNOSES     1. Mitral valve insufficiency nonrheumatic, I34.0  2. 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 Atrial Fibrillation, Paroxysmal, I48.0  4. Arrhythmia-PVCs symptomatic, I49.3   5. CHF chronic systolic, I50.22  6. Atrial Septal Defect, Q21.1  7. Shortness of breath, R06.02  8. Device check automatic implantable cardiac defibrillator, Z45.02  9. Long term (current) use of anticoagulants, Z79.01  10. Status  post atrial septal defect repair, Z98.890  11. Cardiac arrest, history of, Z86.74  12. Poisoning By Loop Diuretics, Accidental, Subs, T50.1X1D  __  ALLERGIES     Erythromycin Base, Rash  Tikosyn, Intolerance-unknown  __  MEDICATIONS     1. Align 4 mg  capsule, Take as Directed  2. amiodarone 200 mg tablet, 1 po daily  3. carvedilol 12.5 mg tablet, 1 po bid  4. Citracal + D Slow Release 600 mg calcium-500 unit tablet,ext.release, 2 po qd  5. coenzyme Q10 100 mg capsule, 1 po qd   6. fluticasone 100 mcg/actuation blister powder for inhalation, Take as Directed  7. Fosamax 70 mg tablet, 1 po qweek  8. gabapentin 100 mg capsule, 1 po tid  9. losartan 25 mg tablet, 1/2 tab po qd, hold sbp less than 100  10. magnesium  200 mg tablet, 1 po qd  11. multivitamin capsule, 1 po qd  12. simvastatin 10 mg tablet, 1 po qhs  13. spironolactone 25 mg tablet, 1/2 tab po qd  14. Synthroid 112 mcg tablet, 1 po qd  15. torsemide 20 mg tablet, 1 po qd sun,mon,wed,fri,sat  and 2 po qd tues,thurs  16. Tylenol Extra Strength 500 mg tablet, 1 po tid  17. Vitron-C 65 mg iron-125 mg tablet,delayed release, 2 x per week  18. Xarelto 15 mg tablet, 1 po qd  __   CHIEF COMPLAINT/REASON FOR VISIT  Followup of Atrial Fibrillation, Paroxysmal and VT d/t hypokalemia  __  HISTORY OF PRESENT  ILLNESS   Ms. Fuelling is a pleasant 87 year old female with a history of persistent atrial fibrillation since April of 2018, who had pulmonary vein isolation and extensive biatrial ablation with biatrial FIRM guided by temporal dispersion in September 2017, who continues  on 200 mg daily of amiodarone for rhythm suppression and 15 mg daily of a renally dosed Xarelto for thromboembolic risk reduction with elevated CHADS-VASc score of 4. She reports to clinic today for followup from her recent hospitalization. On the 15th  of October she was hospitalized with shortness of breath and found to be somewhat volume overloaded and in atrial fibrillation with mostly controlled rates. Her St. Jude dual-chamber ICD was adjusted at that point to minimize ventricular pacing. She was  loaded on amiodarone and has had numerous frequent followups with the heart failure team adjusting her diuretic regimen to get her back down to her dry weight. She reports that she is still short of breath and has dyspnea on exertion but otherwise does  not have any chest pain, dizziness, syncope, or near-syncope or therapy delivered through her ICD.   __  PAST HISTORY      Past Medical Illnesses: celiac disease, Chronic back pain, Intermittent steroid spinal injections, COPD, hypothyroid;   Past Cardiac Illnesses:  Chest pain 2007, arrythmia 2007, PFO, Atrial fibrillation-persistent, Mitral regurgitation, Cardiomyopathy, HFwREF, VT; Infectious  Diseases: Staph infection at age 82 -37, Chicken Pox; Surgical Procedures: Tonsilectomy,Apandectomy , Knee  surgery 2002, total R knee replacement, ASD Repair; Trauma History: whip lash 1980; Cardiology Procedures-Invasive : Echo 08, Stress test 08, Cardiac Cath March 2014, Cardioversion May 2015, Cardioversion July 2015, Cardioversion for Afib August 2016, Cardiac Cath (left and right) October 2017, St Jude dual chamber ICD 05/25/16, Cardiac Cath (right) May 2018, ASD closure  11/11/16, ASD closure 11/11/16;  Cardiology Procedures-Noninvasive: CT scan 01/27/07, Treadmill Stress Test, MPI (Nuclear) Study January 2013, TEE 07/2011, 06/2012,  Holter Monitor July 2015, Echocardiogram 11/2008, 06/2011, 05/2012, 07/2013, 04/2016, 10/2016, 03/2017; Cardiac Cath Results: no significant obstructive CAD,  mild pulm HTN, R to L shunt with Qp:Qs 1.6, LVEDP ; Left Ventricular Ejection Fraction: LVEF of 25% documented via echocardiogram on 04/07/2017   ___  FAMILY HISTORY  Father -- No significant family history  Mother --  Influenza/Pneumonia (cause of death)    __  CARDIAC RISK FACTORS     Tobacco Abuse: has never used tobacco;  Family History of Heart Disease: positive; Hyperlipidemia: positive;  Hypertension: positive;  Diabetes Mellitus: negative;  Prior History of Heart Disease: negative; Obesity: negative;  Sedentary Life Style:positive; [id number]; Menopausal :biological menopause  __  SOCIAL HISTORY     Alcohol Use: Does not use alcohol; Smoking: Does not smoke; Never smoker ([id number]);  Diet: Regular diet and Caffeine use-1-2 per day; Lifestyle: Married, 2 sons - one in [place] and second  in [place]; Exercise: Exercises occasionally;   __  PHYSICAL EXAMINATION     Vital Signs:  Blood Pressure:  90/60 Sitting, Right arm, regular cuff  88/60 Sitting, Left arm, regular cuff     Weight: 133.00 lbs.  Height: 64"  BMI:  23   Pulse: 60/min. Apical Irregular   Respirations:  16/min.       Constitutional: thin frail chronically ill appearing,pale, using cane Skin:  surgical scars well-healed, warm and dry to touch, no apparent skin lesions, no apparent masses noted Head: normocephalic, no tenderness, abnormal female  hair pattern Eyes: pale conjunctivae ENT: Ears,  Nose and throat reveal no gross abnormalities, No pallor or cyanosis Neck: JVD present, carotid pulses are full and equal bilaterally, no bruits  Chest: clear to auscultation bilaterally, Normal symmetry, no intercostal retraction, no use of accessory muscles Cardiac : Irregularly  regular rhythm with variable 1st heart sound, normal 2nd heart sound, low pitched holosystolic murmur grade 2/6 left sternal border Abdomen : bowel sounds normoactive Peripheral Pulses: bilateral radial pulse(s) 1+ Extremities/Back : 1+ bilateral pretibial edema Psychiatric: mild forgetfulness Neurological : oriented to time, person and place, affect appropriate   __    Medications added today by the physician:  amiodarone 200 mg tablet, 1  po daily, 90    IMPRESSIONS:  1. Persistent atrial fibrillation since April 2018 with controlled ventricular response. She   continues on amiodarone and Xarelto with no missed doses.   a. Status post pulmonary vein isolation and extensive  biatrial ablation,   including biatrial FIRM, right atrial isthmus and pulmonary vein isolation   on 27 February 2016. Continues in rate controlled atrial fibrillation at this   point.   2. History of atrial septal defect, status post  atrial septal defect closure on 11 Nov 2016.   3. History of ventricular fibrillation at rest in the setting of a new cardiomyopathy while on   Tikosyn, now status  post St. Jude dual-chamber implantable cardioverter-defibrillator implant.    a. Interrogation of her implantable cardioverter-defibrillator, which is listed   elsewhere in the records, did reveal that she has been in persistent atrial   fibrillation with controlled rates. She does have stable battery and lead   parameters  with battery longevity today estimated at 7.2 years. There were   no other arrhythmias noted and no ICD therapies delivered.   4. Status post left heart catheterization in October 2017 with no significant coronary artery   disease with a positive  right to left shunt and ejection fraction at that time of 35%.   5. Nonischemic cardiomyopathy.   6. Echocardiogram, 06 April 2017, with ejection fraction of 22% with moderate left ventricular   enlargement, grade 3 diastolic dysfunction,  right ventricular enlargement, severe left  atrial   enlargement, with left atrial volume index of 49 mL/m2 with mild aortic insufficiency, mild   pulmonary insufficiency, severe mitral regurgitation, and severe tricuspid regurgitation with    moderate pulmonary hypertension and right ventricular systolic pressure of 57.5 mmHg.   7. History of gastrointestinal bleeding in the past, requiring blood transfusions with no identified   source during extensive gastrointestinal workup. She  continues on Xarelto with no recurrent   bleeding issues.   8. Kyphoscoliosis with moderate restriction and severe DLCO abnormality with continued   followup by Dr. Stacy Gardner.  9. History of frequent premature ventricular contractions in  the past.     RECOMMENDATIONS AND DISCUSSION:  From an arrhythmia standpoint, Ms. Printy does continue to be in rate controlled atrial fibrillation. As she has now been sufficiently loaded on amiodarone and has not converted on her own, I did  recommend at this point that we proceed with cardioversion at [facility] at her earliest convenience. I did review with her the risks, benefits, and potential complications of the procedure as well as the post procedure recovery and followup timeline.  Ms. Stemler was amenable to proceeding and informed consent was obtained in the office today and orders were placed for a cardioversion to occur at [facility] at her earliest convenience. Otherwise, I recommended no other changes to the status  quo. As she was in need of refills for amiodarone, I did send refills for amiodarone 200 mg daily to the pharmacy of her choice and did recommend she follow up with Dr. Marlou Starks in a period of two to three weeks' time. I did review this with Dr. Amado Coe,  who agrees with the assessment and plan.    Karis Juba, PA-C    JO/tumam    cc: [place] J ZIK MD   Danford Bad MD    el   ____________________________  Christianne Dolin  Cardioversion First Available  EP Return Visit 15 minutes  3 weeks

## 2020-09-24 NOTE — Progress Notes (Signed)
[facility] OFFICE  [address] [place], [place] [address]     [name], [place]    Date of Visit:  05/12/2017  Date of Birth: [birth year]  Age: 87 yrs.   Medical Record Number: [id number]  Referring Physician: Casimer Bilis MD, [place] J  __   CURRENT DIAGNOSES     1. Mitral valve insufficiency nonrheumatic, I34.0  2. 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 Atrial Fibrillation, Paroxysmal, I48.0  4. Arrhythmia-PVCs symptomatic, I49.3   5. CHF chronic systolic, I50.22  6. Atrial Septal Defect, Q21.1  7. Shortness of breath, R06.02  8. Device check automatic implantable cardiac defibrillator, Z45.02  9. Long term (current) use of anticoagulants, Z79.01  10. Status  post atrial septal defect repair, Z98.890  11. Cardiac arrest, history of, Z86.74  12. Poisoning By Loop Diuretics, Accidental, Subs, T50.1X1D  __  ALLERGIES     Erythromycin Base, Rash  Tikosyn, Intolerance-unknown  __  MEDICATIONS     1. Align 4 mg  capsule, Take as Directed  2. amiodarone 200 mg tablet, 1 po daily  3. carvedilol 12.5 mg tablet, 1 po bid  4. Citracal + D Slow Release 600 mg calcium-500 unit tablet,ext.release, 2 po qd  5. coenzyme Q10 100 mg capsule, 1 po qd   6. fluticasone 100 mcg/actuation blister powder for inhalation, Take as Directed  7. Fosamax 70 mg tablet, 1 po qweek  8. gabapentin 100 mg capsule, 1 po tid  9. losartan 25 mg tablet, 1/2 tab po qd, hold sbp less than 100  10. magnesium  200 mg tablet, 1 po qd  11. multivitamin capsule, 1 po qd  12. simvastatin 10 mg tablet, 1 po qhs  13. spironolactone 25 mg tablet, 1/2 tab po qd  14. Synthroid 112 mcg tablet, 1 po qd  15. torsemide 20 mg tablet, 1 po qd sun,mon,wed,fri,sat  and 2 po qd tues,thurs  16. Tylenol Extra Strength 500 mg tablet, 1 po tid  17. Vitron-C 65 mg iron-125 mg tablet,delayed release, 2 x per week  18. Xarelto 15 mg tablet, 1 po qd  __   CHIEF COMPLAINT/REASON FOR VISIT  Followup of CHF  __  HISTORY OF PRESENT ILLNESS  Ms. Hauter  is here for a followup today. The  patient was loaded with amiodarone and [place] cardioversion was planned to her device, but this was not successful. She is here for followup. She was felt to be in heart failure, and her dosages of diuretics were increased  transiently to a total of 40 mg a day and she is now back down to 20 mg a day. She has lost about 4 pounds. She thinks her lower extremity edema is better. Her shortness of breath is about the same. She has not had lightheadedness or falls. She does sometimes  get headaches.    PHYSICAL EXAMINATION    Vital Signs :  Blood Pressure:  92/50 Sitting, Right arm, regular cuff  90/46 Sitting, Left arm, regular cuff     Weight: 129.40 lbs.  Height: 64"  BMI:  22   Pulse: 56/min.       Constitutional:  thin frail chronically ill appearing,pale, using cane Skin: surgical scars well-healed, warm and dry to touch, no apparent skin lesions, no apparent masses  noted Head: normocephalic, no tenderness, abnormal female hair pattern  Eyes: pale conjunctivae ENT: Ears, Nose and throat reveal  no gross abnormalities, No pallor or cyanosis Neck: JVD present, carotid pulses are full and equal bilaterally,  no bruits  Chest: clear to auscultation bilaterally, Normal symmetry, no intercostal retraction, no use of accessory muscles  Cardiac: Irregularly irregular rhythym; normal S1 and S2. Grade 2/6 SM @ [facility]SB Abdomen: bowel sounds normoactive  Peripheral Pulses: bilateral radial pulse(s) 1+ Extremities/Back : 1+ bilateral pretibial edema Psychiatric: mild forgetfulness  Neurological: oriented to time, person and place, affect appropriate   __    Medications added  today by the physician:      IMPRESSIONS:   1. Progressively worse dilated cardiomyopathy with  an ejection fraction that is now 22% with   chronic systolic heart failure.  2. 3+ mitral regurgitation - this is secondary to dilated mitral valve annulus from the   cardiomyopathy.  3. Persistent atrial fibrillation - she was not able  to be converted despite having  been loaded   with amiodarone - this is likely due to the progressive dilated cardiomyopathy and mitral   regurgitation.  4. Iatrogenic atrial septal defect status post closure on Nov 11, 2016.  5. CHADS-VASc  score of 4 - on Xarelto.  6. VF arrest in the setting of cardiomyopathy while on Tikosyn - she has an ICD, which was   placed in December 2017.  7. Nonobstructive coronary disease by cardiac catheterization on Nov 11, 2016.  8. Kyphoscoliosis  with moderate restriction and severe reduction in DLCO - followed by   Dr. Stacy Gardner.  9. Hypothyroidism.  10. Relative iatrogenic hypotension.    RECOMMENDATIONS:    1. At this time, the patient should continue her current dose of torsemide at 20 mg p.o.   daily.  2. We will proceed with checking her BMP and magnesium.  3. She may be a candidate for mitral valve clip procedure for her 3+ mitral  regurgitation,   which is secondary based on the recent results of the COAPT trial.  4. We will order a transesophageal echocardiogram to further delineate her valve anatomy   and assess her for possibility of mitral valve clip placement.   5. I will see her back in two to three weeks.    Massie Maroon, MD, FACC, FSCAI     Tid: [id number]    cc:  [place] Docia Furl MD  Danford Bad MD    AP  ____________________________   TODAYS ORDERS  Magnesium Today  TEE 1 week  Return Visit 30 MIN 2 weeks

## 2020-09-24 NOTE — Progress Notes (Signed)
[facility] OFFICE  [address] [place], [place] [address]     [name], [place]    Date of Visit:  03/04/2017  Date of Birth: 11-01-1931  Age: 87 yrs.   Medical Record Number: [id number]  __  CURRENT DIAGNOSES     1. Mitral valve insufficiency nonrheumatic,  I34.0  2. 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 Atrial Fibrillation, Paroxysmal, I48.0  4. Arrhythmia-PVCs symptomatic, I49.3  5. CHF chronic systolic, I50.22  6. Atrial Septal Defect, Q21.1  7. Shortness of breath, R06.02  8.  Device check automatic implantable cardiac defibrillator, Z45.02  9. Status post atrial septal defect repair, Z98.890  10. Long term (current) use of anticoagulants, Z79.01  11. Cardiac arrest, history of, Z86.74  12. Unspecified atrial  fibrillation, I48.91  __  ALLERGIES    Erythromycin Base, Rash  Tikosyn, Intolerance-unknown   __  MEDICATIONS     1. Align 4 mg capsule, Take as Directed  2. carvedilol 12.5 mg tablet, 1 po bid  3. Citracal + D Slow Release 600 mg  calcium-500 unit tablet,ext.release, 2 po qd  4. fluticasone 100 mcg/actuation blister powder for inhalation, Take as Directed  5. Fosamax 70 mg tablet, 1 po qweek  6. gabapentin 100 mg capsule, 1 po tid  7. Lasix 40 mg tablet, 60mg  daily  for 5 days, then resume 40mg  daily  8. magnesium 250 mg tablet, 1 po qd  9. multivitamin capsule, 1 po qd  10. potassium chloride ER 10 mEq tablet,extended release, 1 po bid  11. simvastatin 10 mg tablet, 1 po qhs  12. Synthroid 112  mcg tablet, 1 po qd  13. Tylenol Extra Strength 500 mg tablet, 1 po tid  14. Vitron-C 65 mg iron-125 mg tablet,delayed release, 2 x per week  15. Xarelto 20 mg tablet, 1 po qd  __  CHIEF  COMPLAINT/REASON FOR VISIT  Followup of Shortness of breath  __  HISTORY OF PRESENT ILLNESS   [name] is an 87 year old female who presents for followup of congestive heart failure. She notes increasing shortness of breath with exertion and the need to take some additional deep breaths when she lies down in  bed at night. She also has increased  lower extremity edema and approximately a three to four pound weight gain. All these symptoms have been progressing over the past week. She has known chronic systolic congestive heart failure with an LVEF of 20% to 25% by most recent echocardiogram in  May 2018. She was hospitalized at that time for an ASD closure done on Nov 11, 2016. She was placed on three months of Plavix at that time and then returned to the hospital in mid June with rectal bleeding. No obvious source was identified. Her Xarelto  and Plavix were temporarily held. She received a blood transfusion and her hemoglobin stabilized. At discharge, she was resumed on both the Xarelto and Plavix. She has since completed the three months of Plavix, so she is no longer taking it. She has  had no recurrent rectal bleeding.    She denies chest pain, palpitations, PND, dizziness, presyncope or syncope. Home blood pressure readings had been ranging in the 100s to 120 over 60s to 80s with heart rates in the 70s to 80s.    Patient  was reportedly taking a slightly higher dose of carvedilol at 18.75 mg twice daily. She took this for several weeks and then did not know she was supposed to continue, so has since gone back to  12.5 mg twice daily.  __   PAST HISTORY     Past Medical Illnesses: celiac disease, Chronic back pain, Intermittent steroid spinal  injections, COPD;  Past Cardiac Illnesses: Chest pain 2007, arrythmia 2007, PFO, Atrial fibrillation-persistent,  Mitral regurgitation; Infectious Diseases: Staph infection at age 71 -56, Chicken Pox; Surgical Procedures : Tonsilectomy,Apandectomy , Knee surgery 2002, total R knee replacement, ASD Repair; Trauma History: whip lash 1980;  Cardiology Procedures-Invasive: Echo 08, Stress test 08, Cardiac Cath March 2014, Cardioversion May 2015, Cardioversion July 2015, Cardioversion for Afib August 2016, Cardiac Cath (left and right) October  2017, St Jude dual chamber ICD  05/25/16, Cardiac Cath (right) May 2018, ASD closure 11/11/16, ASD closure 11/11/16; Cardiology Procedures-Noninvasive: Echocardiogram  June 2010, CT scan 01/27/07, Treadmill Stress Test, MPI (Nuclear) Study January 2013, Echocardiogram January 2013, TEE February 2013, Echocardiogram December 2013, TEE January 2014, Echocardiogram February 2015, Holter Monitor July 2015, Echocardiogram  November 2017, Echocardiogram May 2018; Cardiac Cath Results: no significant obstructive CAD, mild pulm HTN, R to L shunt with Qp:Qs 1.6, LVEDP ;  Left Ventricular Ejection Fraction: LVEF of 20% documented via echocardiogram on 11/12/2016  ___   FAMILY HISTORY  Father -- No significant family history   Mother -- Influenza/Pneumonia (cause of death)    __  CARDIAC RISK FACTORS      Tobacco Abuse: has never used tobacco; Family History of Heart Disease: positive;  Hyperlipidemia: positive; Hypertension: positive;   Diabetes Mellitus: negative; Prior History of Heart Disease: negative;  Obesity: negative; Sedentary Life Style:positive; Age :positive; Menopausal:biological menopause  __  SOCIAL HISTORY     Alcohol Use: Does not use alcohol; Smoking: Does not smoke; Never smoker ([id number]);  Diet: Regular diet and Caffeine use-1-2 per day; Lifestyle: Married, 2 sons - one in [place] and second in [place];  Exercise: Exercises occasionally;   __  PHYSICAL EXAMINATION     Vital Signs:  Blood Pressure:  116/66 Sitting, Left arm, regular cuff  114/66 Sitting, Right arm,  regular cuff    Weight: 131.80 lbs.  Height: 25.1968"   BMI: 145   Pulse: 86/min. EKG        Constitutional: Well developed, chronically ill appearing, in no acute distress Skin: warm and dry to touch  Head: normocephalic Neck:  +JVD, at 30 degrees Chest: no use of accessory muscles, normal respiratory effort, coarse bibasilar rales  Cardiac: Irregularly regular rhythm with variable 1st heart sound, normal 2nd heart sound, low pitched holosystolic murmur grade 2/6 left sternal border   Abdomen: abdomen soft Peripheral Pulses: 2+ radial pulses  b/l Extremities/Back: 1+ pretibial/ankle edema b/l, reduced muscle strength b/l Psychiatric : normal memory Neurological: oriented to time, person  and place, affect appropriate   __    Medications added today by the physician:  Lasix 40 mg tablet, 60mg  daily for 5 days, then resume 40mg   daily, 90      ECG:   ECG today shows electrically paced rhythm.    IMPRESSIONS:    1. Chronic systolic congestive heart failure, New [place] Heart Association Class III symptoms.   Appears volume overloaded on exam today with reported three to four pound weight gain at   home and increasing lower extremity edema.  2.  Atrial septal defect, status post closure on Nov 11 2016. Completed three months of Plavix.  3. Recent hospitalization in June 2018 for gastrointestinal bleeding, of which a source was not   identified. Required blood transfusion during her hospitalization.  Transiently off Plavix and  Xarelto, with subsequent completion of the Plavix and has since been back on Xarelto.  4. Paroxysmal atrial fibrillation, status post failure of amiodarone and subsequent extensive left   and right atrial focus  impulsive rotor ablation with pulmonary vein isolation in September 2017.  5. CHADS VASc score of 4, on Xarelto. No acute bleeding.  6. Out of hospital cardiac arrest in December 2017.  7. Nonischemic cardiomyopathy, with LVEF 20% to 25% by  most recent echocardiogram in   May 2018. Status post ICD in December 2017.  8. Chronic multifactorial dyspnea on exertion.  9. Kyphoscoliosis with moderate restriction, also with severe DLCO abnormality. Managed by   Dr. Stacy Gardner.    10. Intolerance to ACE inhibitor, due to hypotension.     RECOMMENDATIONS:   1. Increase Lasix to 60 mg daily for five days, then resume 40 mg  daily thereafter.  2. Continue monitoring weight at home and call if weight is not 130 to 131 pounds after completing   the five days of increased Lasix.  3. Dry  weight is around 130 pounds.  4. We will obtain most recent basic metabolic  panel from PCP (reportedly done in August 2018).  5. Would favor retrial of ARB or Entresto once patient closer to euvolemia, if blood pressure will   allow.  6. Would also benefit from spironolactone if blood pressure will allow.  7.  Continue all other cardiac medications as prescribed.  8. Schedule a followup in heart failure clinic in two weeks, sooner as needed.  9. Continue monitoring blood pressure at home.  10. Routine followup with PCP.    Jovannie Ulibarri L. Izola Price,  ANP-BC, AGACNP-BC     Tid: [id number]    cc: Bryon Lions MD  Danford Bad MD    LK   ____________________________  TODAYS ORDERS   Heart Failure Clinic Visit 2 weeks  12 Lead ECG Today

## 2020-09-24 NOTE — Progress Notes (Signed)
[facility] OFFICE  [address] [place], [place] [address]     [name], [place]    Date of Visit:  01/31/2018  Date of Birth: [birth year]  Age: 87 yrs.   Medical Record Number: [id number]  __  CURRENT DIAGNOSES     1. Mitral valve insufficiency nonrheumatic,  I34.0  2. 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 Atrial Fibrillation, Paroxysmal, I48.0  4. Arrhythmia-PVCs symptomatic, I49.3  5. CHF chronic systolic, I50.22  6. Atrial Septal Defect, Q21.1  7. Shortness of breath, R06.02  8.  Device check automatic implantable cardiac defibrillator, Z45.02  9. Long term (current) use of anticoagulants, Z79.01  10. Cardiac arrest, history of, Z86.74  11. Status post ICD, Z95.810  12. Status post atrial septal defect repair,  Z98.890  13. Poisoning By Loop Diuretics, Accidental, Subs, T50.1X1D  __  ALLERGIES    Erythromycin  Base, Rash  Tikosyn, Intolerance-unknown  __  MEDICATIONS     1. Align 4 mg capsule, Take  as Directed  2. amiodarone 200 mg tablet, 1 po daily  3. carvedilol 12.5 mg tablet, 1 po bid  4. Citracal + D Slow Release 600 mg calcium-500 unit tablet,ext.release, 2 po qd  5. coenzyme Q10 100 mg capsule, 1 po qd  6. fluticasone  100 mcg/actuation blister powder for inhalation, Take as Directed  7. Fosamax 70 mg tablet, 1 po qweek  8. gabapentin 100 mg capsule, 1 po tid  9. Lasix 40 mg tablet, 1/2 tab qd  10. losartan 25 mg tablet, 1 po qd  11. magnesium 200  mg tablet, 1 po qd  12. multivitamin capsule, 1 po qd  13. potassium chloride ER 10 mEq capsule,extended release, 1 po qd  14. simvastatin 10 mg tablet, 1 po qhs  15. spironolactone 25 mg tablet, 1/2 tab po qd  16. Synthroid 112 mcg  tablet, 1 po qd  17. Tylenol Extra Strength 500 mg tablet, 1 po tid  18. Vitron-C 65 mg iron-125 mg tablet,delayed release, 2 x per week  19. Xarelto 15 mg tablet, 1 po qd  __  CHIEF  COMPLAINT/REASON FOR VISIT  Followup of Atrial Fibrillation, Paroxysmal and Followup of CHF chronic systolic  __  HISTORY OF PRESENT  ILLNESS   [name] comes in and is doing reasonably well. She has been maintaining her weight. Her last set of labs showed some degree of renal insufficiency and mild anemia. I have encouraged her to reduce her furosemide and we might even consider discontinuation  if the next set of labs is showing persistent dysfunction. She will also increase her oral hydration. She continues to be under stress in that her sister-in-law lives with her. The sister-in-law has cognitive impairment and the patient and her husband  are looking for a memory care unit.  __  PAST HISTORY     Past Medical Illnesses : celiac disease, Chronic back pain, Intermittent steroid spinal injections, COPD, hypothyroid;  Past Cardiac Illnesses : Chest pain 2007, arrythmia 2007, PFO, Atrial fibrillation-persistent, Mitral regurgitation, Cardiomyopathy, HFwREF, VT, atrial septal defect, ICD; Infectious Diseases : Staph infection at age 86 -41, Chicken Pox; Surgical Procedures: Tonsilectomy,Apandectomy , Knee surgery 2002, total R knee replacement, ASD Repair;  Trauma History: whip lash 1980; Cardiology Procedures-Invasive: Echo 08, Stress test 08, Cardiac Cath March  2014, Cardioversion May 2015, Cardioversion July 2015, Cardioversion for Afib August 2016, Cardiac Cath (left and right) October 2017, St Jude dual chamber ICD 05/25/16, Cardiac Cath (right) May 2018, ASD closure 11/11/16, ASD  closure 11/11/16, Pacemaker  implant January 2019; Cardiology Procedures-Noninvasive: CT scan 01/27/07, Treadmill Stress Test, MPI (Nuclear) Study January 2013, TEE 07/2011, 06/2012, Holter  Monitor July 2015, Echocardiogram 11/2008, 06/2011, 05/2012, 07/2013, 04/2016, 10/2016, 03/2017, Echocardiogram April 2019; Cardiac Cath Results: no significant  obstructive CAD, mild pulm HTN, R to L shunt with Qp:Qs 1.6, LVEDP ; Left Ventricular Ejection Fraction: LVEF of 50% documented via echocardiogram  on 09/29/2017  SOCIAL HISTORY    Alcohol Use : Does not use alcohol;  Smoking: Does not smoke; Never smoker ([id number]); Diet : Regular diet and Caffeine use-1-2 per day; Lifestyle: Married, 2 sons - one in [place] and second in [place];  Exercise: Exercises occasionally;   __  PHYSICAL EXAMINATION    Vital Signs:   Blood Pressure:  102/66 Sitting, Left arm, Pediatric cuff  100/68 Sitting, Right arm, Pediatric cuff    Weight:  127.00 lbs.   Pulse: 70/min. EKG       Constitutional:  Well developed, chronically ill appearing, in no acute distress, walking with cane Skin: surgical scars well-healed, warm and dry to touch, erythematous  fingertips Head: normocephalic, normal female hair pattern Eyes : pale conjunctivae ENT: No pallor or cyanosis Neck : JVP normal, no carotid bruit, thyroid not enlarged Chest: Normal symmetry, kyphosis, clear to auscultation bilaterally  Cardiac: Regular rhythm, S1 normal, S2 normal, No S3 or S4, soft mid systolic murmur grade 2/6 heard best at the apex Abdomen : bowel sounds normoactive Peripheral Pulses: bilateral radial pulse(s) 1+ Extremities/Back : 1+ bilateral pretibial edema Psychiatric: mild forgetfulness Neurological : oriented to time, person and place, affect appropriate   __    Medications added today by the physician:    IMPRESSIONS:   1. Class  II, chronic systolic heart failure, with LVEF up to 50% after ICD upgrade. Prior ejection   fraction on TEE, June 16, 2017, was 22%. The patient has maintained euvolemia for   several months now.  2. Mild renal insufficiency with mild  anemia on last set of labs.  3. Moderate mitral regurgitation with moderate pulmonary hypertension, improved to mild mitral   regurgitation and mild pulmonary hypertension on last echo.  4. Paroxysmal atrial fibrillation, previously persistent  but in sinus rhythm after cardioversion   during biventricular ICD upgrade, June 28, 2017. Prior failure of amiodarone with subsequent   extensive left and right atrial ablation. Focal impulse and rotor modulation-guided bitemporal    dispersion  ablation, September 2017.  5. Xarelto initiated for anticoagulation. The patient's renal function is currently impaired. We are   going to recheck that in a couple of weeks. Her current creatinine clearance is 25 mL/min).  6. Atrial septal  defect status post closure Nov 11, 2016. She completed three months of Plavix.  7. Severe cardiomyopathy with left bundle branch block and heart failure resulting in biventricular   ICD upgrade, June 28, 2017. Last interrogation May 04, 2017.  8. Ventricular fibrillation arrest in the setting of new cardiomyopathy while on Tikosyn, status   post original ICD implant, December 2017.  9. Cardiac catheterization showing no significant coronary disease, Nov 11, 2017.  10.  Kyphoscoliosis with moderate restriction. Severe DLCO abnormality, followed by Dr. Lucillie Garfinkel.  11. Hypothyroidism.  12. Intolerance of ACE inhibitor previously due to hypotension.    RECOMMENDATIONS:   1. Decrease Lasix to 20 mg daily and  recheck labs in two to three weeks.  2. Office visit in one month to reassess. We may need to address ongoing  Xarelto if creatinine   clearance does not improve. Additionally, the patient was encouraged to increase oral   hydration a bit.   3. Early followup in one month. If clinically stable, she could go back to every three months.    Encarnacion Slates, MD     Tid: [id number]:WU    cc: Danford Bad MD   ____________________________   TODAYS ORDERS  Basic Metabolic Panel 3 weeks  12 Lead ECG Today  Return Visit 15 MIN 1 month

## 2020-09-24 NOTE — Progress Notes (Signed)
[facility] OFFICE  [address] [place], [place] [address]     [name], [place]    Date of Visit:  08/23/2017  Date of Birth: [birth year]  Age: 87 yrs.   Medical Record Number: [id number]  __  CURRENT DIAGNOSES     1. Mitral valve insufficiency nonrheumatic,  I34.0  2. 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 Atrial Fibrillation, Paroxysmal, I48.0  4. Atrial Fibrillation, Persistent, I48.1  5. Arrhythmia-PVCs symptomatic, I49.3  6. CHF chronic systolic, I50.22  7. Atrial Septal Defect, Q21.1   8. Shortness of breath, R06.02  9. Device check automatic implantable cardiac defibrillator, Z45.02  10. Long term (current) use of anticoagulants, Z79.01  11. Cardiac arrest, history of, Z86.74  12. Status post ICD, Z95.810  13. Status  post atrial septal defect repair, Z98.890  14. Poisoning By Loop Diuretics, Accidental, Subs, T50.1X1D  __  ALLERGIES     Erythromycin Base, Rash  Tikosyn, Intolerance-unknown  __  MEDICATIONS     1. Align 4 mg  capsule, Take as Directed  2. amiodarone 200 mg tablet, 1 po daily  3. carvedilol 12.5 mg tablet, 1 po bid  4. Citracal + D Slow Release 600 mg calcium-500 unit tablet,ext.release, 2 po qd  5. coenzyme Q10 100 mg capsule, 1 po qd   6. fluticasone 100 mcg/actuation blister powder for inhalation, Take as Directed  7. Fosamax 70 mg tablet, 1 po qweek  8. gabapentin 100 mg capsule, 1 po tid  9. Lasix 40 mg tablet, 1 TAB po daily  10. losartan 25 mg tablet, 1 po qd   11. magnesium 200 mg tablet, 1 po qd  12. multivitamin capsule, 1 po qd  13. simvastatin 10 mg tablet, 1 po qhs  14. spironolactone 25 mg tablet, 1/2 tab po qd  15. Synthroid 112 mcg tablet, 1 po qd  16. Tylenol Extra Strength 500  mg tablet, 1 po tid  17. Vitron-C 65 mg iron-125 mg tablet,delayed release, 2 x per week  18. Xarelto 15 mg tablet, 1 po qd  __  CHIEF COMPLAINT/REASON FOR VISIT   Followup of Atrial Fibrillation, Persistent, Followup of CHF chronic systolic and Followup of Mitral valve insufficiency  nonrheumatic  __  HISTORY OF PRESENT ILLNESS   [name] comes in and is doing reasonably well. She occasionally notes some right neck discomfort as well as a pounding sensation in her head. She is under some pretty significant social stressors at home as her sister-in-law has come to live with her.  She is therefore having to do much of the household work without much help. The patient has been doing reasonably well from a cardiac standpoint without decompensation. She seems to be remaining in sinus rhythm on clinical exam.    __  PAST HISTORY     Past Medical Illnesses:  celiac disease, Chronic back pain, Intermittent steroid spinal injections, COPD, hypothyroid;  Past Cardiac Illnesses : Chest pain 2007, arrythmia 2007, PFO, Atrial fibrillation-persistent, Mitral regurgitation, Cardiomyopathy, HFwREF, VT; Infectious Diseases: Staph infection  at age 71 -59, Chicken Pox; Surgical Procedures: Tonsilectomy,Apandectomy , Knee surgery 2002, total R knee replacement, ASD Repair;  Trauma History: whip lash 1980; Cardiology Procedures-Invasive: Echo 08, Stress test 08, Cardiac Cath March  2014, Cardioversion May 2015, Cardioversion July 2015, Cardioversion for Afib August 2016, Cardiac Cath (left and right) October 2017, St Jude dual chamber ICD 05/25/16, Cardiac Cath (right) May 2018, ASD closure 11/11/16, ASD closure 11/11/16, Pacemaker  implant January 2019; Cardiology Procedures-Noninvasive: CT scan  01/27/07, Treadmill Stress Test, MPI (Nuclear) Study January 2013, TEE 07/2011, 06/2012, Holter  Monitor July 2015, Echocardiogram 11/2008, 06/2011, 05/2012, 07/2013, 04/2016, 10/2016, 03/2017; Cardiac Cath Results: no significant obstructive CAD, mild  pulm HTN, R to L shunt with Qp:Qs 1.6, LVEDP ; Left Ventricular Ejection Fraction: LVEF of 25% documented via echocardiogram on 04/07/2017   SOCIAL HISTORY    Alcohol Use: Does not use alcohol;  Smoking: Does not smoke; Never smoker ([id number]); Diet: Regular diet and Caffeine  use-1-2 per day;  Lifestyle: Married, 2 sons - one in [place] and second in [place]; Exercise: Exercises occasionally;   __   PHYSICAL EXAMINATION    Vital Signs:  Blood Pressure:   126/74 Sitting, Left arm, regular cuff  126/74 Sitting, Right arm, regular cuff    Weight: 128.00 lbs.   Height: 64"  BMI: 22   Pulse:  68/min. apical       Constitutional: Well developed, chronically ill appearing, in no acute  distress, walking with cane Skin: surgical scars well-healed, warm and dry to touch, erythematous fingertips  Head: normocephalic, normal female hair pattern Eyes:  pale conjunctivae ENT: No pallor or cyanosis Neck : JVP normal, no carotid bruit, thyroid not enlarged Chest: Normal symmetry, kyphosis, clear to auscultation  bilaterally Cardiac: Irregularly irregular rhythym; normal S1 and S2. Grade 2/6 SM @ LSB Abdomen : bowel sounds normoactive Peripheral Pulses: bilateral radial pulse(s) 1+  Extremities/Back: 1+ bilateral pretibial edema Psychiatric:  mild forgetfulness Neurological: oriented to time, person and place, affect appropriate   __     Medications added today by the physician:      IMPRESSIONS:   1. Class III chronic systolic heart  failure with left ventricular ejection fraction 22% by TEE   May 27, 2017. The patient has an appointment for repeat echo next month.  2. Moderate mitral regurgitation with moderate pulmonary hypertension on transesophageal echo.   No  shunt across the atrial septum. Evidence for right ventricular dysfunction as well.  3. Atrial septal defect status post closure Nov 11, 2016. She completed three months of Plavix.  4. Persistent atrial fibrillation, currently in sinus rhythm  after cardioversion during biventricular ICD   upgrade. Prior failure of amiodarone with subsequent extensive left and right atrial ablation with   focal impulse and rotor modulation guided bitemporal dispersion ablation September 2017.  5.  Severe cardiomyopathy with left bundle-branch block and heart  failure resulting in biventricular ICD   upgrade June 28, 2017. Last interrogation May 04, 2017.  6. CHADS-VASc score of 4 on 15 mg of Xarelto due to creatinine clearance 30  mL/min.  7. VF arrest in the setting of new cardiomyopathy while on Tikosyn status post original ICD implant   December 2017.  8. Cardiac catheterization showing no significant coronary disease Nov 11, 2016.  9. Kyphoscoliosis with moderate  restriction. Severe DLCO abnormality, followed with Dr. Stacy Gardner.  10. Hypothyroidism.  11. Intolerance of ACE inhibitor previously due to hypotension.    RECOMMENDATIONS:    1. Continue current medical therapy as the patient seems to be reasonably compensated.  2. Return for echocardiogram and clinical followup in two months.  3. I did suggest to the patient that she consider a holiday spent with her sons to  decompress from the   stress at home.    Encarnacion Slates, MD     Tid: [id number]:    cc: Danford Bad MD    OP  ____________________________   Christianne Dolin  Return Visit  15 MIN 2 months

## 2020-09-24 NOTE — Progress Notes (Signed)
[facility] OFFICE  [address] [place], [place] [address]     [name], [place]    Date of Visit:  08/10/2014  Date of Birth: 25-May-1932  Age: 87 yrs.   Medical Record Number: [id number]  __  CURRENT DIAGNOSES     1. Mitral valve insufficiency nonrheumatic,  I34.0  2. Unspecified atrial fibrillation, I48.91  3. Shortness of breath, R06.02  4. Long term (current) use of anticoagulants, Z79.01  __  ALLERGIES     Erythromycin Base, Rash  __  MEDICATIONS     1. Fosamax 70 mg tablet, 1 po qweek  2. Align  4 mg capsule, Take as Directed  3. gabapentin 100 mg capsule, 1 po tid  4. simvastatin 20 mg tablet, 1 po qd  5. magnesium 250 mg tablet, 1 po qd  6. Tylenol Extra Strength 500 mg tablet, 1 po tid  7. multivitamin capsule, 1 po qd   8. Citracal + D 250 mg calcium- 250 unit tablet, chewable, 1 po qd  9. Claritin 10 mg tablet, 1 po qd  10. CoQ-10 100 mg capsule, 1 po qd  11. lisinopril 5 mg tablet, 1 po qd  12. warfarin 5 mg tablet, 1 po qd or as directed by [facility]   13. amiodarone 200 mg tablet, 1 po qd  14. Synthroid 112 mcg tablet, 1 po qd  15. Vitron-C 65 mg iron-125 mg tablet,delayed release, 2 x per week  16. Lasix 20 mg tablet, 1 po qod  __   CHIEF COMPLAINT/REASON FOR VISIT  Followup of Mitral valve insufficiency nonrheumatic and Followup of Shortness of breath  __  HISTORY OF PRESENT  ILLNESS  Mrs. Avalos returns for general cardiology followup, having been almost one year since I saw her last. She is doing much better now that she has been returned back to normal sinus rhythm. Her  echocardiogram also showed significant improvement with decreased mitral regurgitation, decreased tricuspid regurgitation and decreased left atrial chamber size. It does seem that her arrhythmia was precipitating much of her valvular heart disease exacerbation,  as well as symptoms. We discussed that at this point continued rhythm management will be the key to improving her functional capacity. She is working out with  a Chief Executive Officer twice a week and trying to improve her strength and stamina. She is being  followed closely by Dr. Stacy Gardner for underlying restrictive lung disease and COPD. She has developed problems with her right knee and actually got a cortisone shot recently. Her blood pressure is elevated today and we discussed the adverse effect on filling  pressures. She was advised to take her diuretic daily if her blood pressure is elevated, but every other day if it remains stable.    PAST HISTORY      Past Medical Illnesses: celiac disease, Chronic back pain, Intermittent steroid spinal injections, COPD;   Past Cardiac Illnesses: Chest pain 2007, arrythmia 2007, PFO, Atrial fibrillation-persistent, Mitral regurgitation; Infectious Diseases : Staph infection at age 75 -89, Chicken Pox; Surgical Procedures: Tonsilectomy,Apandectomy , Knee surgery 2002;  Trauma History: whip lash 1980; Cardiology Procedures-Invasive: Echo 08, Stress test 08, Cardiac Cath March  2014, Cardioversion May 2015, Cardioversion July 2015; Cardiology Procedures-Noninvasive: Echocardiogram June 2010, CT scan 01/27/07, Treadmill Stress Test,  MPI (Nuclear) Study January 2013, Echocardiogram January 2013, TEE February 2013, Echocardiogram December 2013, TEE January 2014, Echocardiogram February 2015, Holter Monitor July 2015; Cardiac Cath  Results: 08/2012: EF 45%; RCA 20% stenosis in prox.; mild pulm. htn,  no significant obstructive CAD.; Left Ventricular Ejection Fraction : LVEF of 60% documented via echocardiogram on 07/28/2013  SOCIAL HISTORY     Alcohol Use: Does not use alcohol; Smoking: Does not smoke; Never smoker ([id number]);  Diet: Regular diet and Caffeine use-1-2 per day; Lifestyle: Married;  Exercise: Exercises occasionally;   __  PHYSICAL EXAMINATION    Vital Signs:   Blood Pressure:  165/80 Sitting, Left arm, regular cuff  170/86 Sitting, Right arm, regular cuff    Weight:  150.00 lbs.  Height: 65"  BMI: 25    Pulse: 68/min.        Constitutional: Well developed, well nourished, in no acute distress  Skin: warm and dry to touch Head: normocephalic, normal female hair pattern  Eyes: conjunctivae and lids unremarkable, EOMS intact ENT: No pallor or cyanosis  Neck: carotid pulses are full and equal bilaterally, no JVD, no bruits Chest: Normal symmetry, clear  to auscultation bilaterally, normal A-P diameter, kyphosis, No ronchi with good air movement Cardiac: Normal 1st and 2nd heart sounds without murmur  or gallop, regular, 2/6 systolic blowing murmur at LLSB Abdomen: bowel sounds normoactive, no bruits  Peripheral Pulses: pulses full and equal in all extremities Extremities/Back: Normal muscle strength  and tone., no edema present, kyphosis present, scoliosis present Psychiatric: normal memory Neurological : No gross motor or sensory deficits noted, affect appropriate, oriented to time, person and place.   __    Medications added today by the physician:       IMPRESSIONS:  1. Multifactorial dyspnea, now more clearly linked to previous atrial fibrillation,   now in sinus rhythm but with component of moderate COPD, as well as   restriction.  2. Moderate to severe mitral regurgitation with biatrial  enlargement and   moderate to severe tricuspid regurgitation on echo July 28, 2013, now   markedly improved on last echo July 27, 2014 to a mild degree with   decrease in left atrial size.  3. Transesophageal echo 2014: Etiology  of mitral regurgitation was annular  dilatation.  4. Cardiac catheterization September 08, 2012: Relatively compensated   hemodynamics with only moderate mitral regurgitation and essentially normal   coronary arteries.  5. Kyphoscoliosis  with some component of restriction in activity due to back   pain and now right knee pain.  6. Right sided neck fullness with remote negative CT of neck in 2008.  7. Normal CT of the abdomen and pelvis in 2009 with history of celiac disease.   8. Anemia, followed by Dr. Pecola Leisure with  normal blood counts.  9. Atrial fibrillation. Failed Tikosyn and now in sinus rhythm on amiodarone.  10. Hypothyroidism, on Synthroid.    RECOMMENDATIONS:  1. Given the marked improvement in her  valvular heart disease, rhythm control   seems to be the primary goal for management at this time. There is no indication   for surgical intervention on her mitral regurgitation.  2. We did discuss taking her diuretic on a daily basis if her  blood pressure is up. It   is likely elevated today because of the recent cortisone shot.  3. She is going to follow up in six months with Dr. Amado Coe and will see [place] again at that   point as well. She will let us know in the interval if her  blood pressure is not under   better control.    Encarnacion Slates, M.D.     Tid: [id number]    cc: Danford Bad MD  ELLEN Micki Riley MD    rw           ____________________________  Christianne Dolin  Return Visit 15 MIN 6 months        Encarnacion Slates, MD

## 2020-09-24 NOTE — Progress Notes (Signed)
[facility] OFFICE  [address] [place], [place] [address]     [name], [place]    Date of Visit:  04/15/2017  Date of Birth: [birth year]  Age: 87 yrs.   Medical Record Number: [id number]  __  CURRENT DIAGNOSES     1. Mitral valve insufficiency nonrheumatic,  I34.0  2. 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 Atrial Fibrillation, Paroxysmal, I48.0  4. Arrhythmia-PVCs symptomatic, I49.3  5. CHF chronic systolic, I50.22  6. Atrial Septal Defect, Q21.1  7. Shortness of breath, R06.02  8.  Poisoning By Loop Diuretics, Accidental, Subs, T50.1X1D  9. Device check automatic implantable cardiac defibrillator, Z45.02  10. Status post atrial septal defect repair, Z98.890  11. Long term (current) use of anticoagulants, Z79.01   12. Cardiac arrest, history of, Z86.74  __  ALLERGIES    Erythromycin Base, Rash  Tikosyn,  Intolerance-unknown  __  MEDICATIONS     1. Align 4 mg capsule, Take as Directed  2. amiodarone  200 mg tablet, 2 po bid for 2 weeks 1 po bid x 1 week then 1 po qd for one week  3. Arnuity Ellipta 100 mcg/actuation powder for inhalation, Take as Directed  4. carvedilol 12.5 mg tablet, 1 po bid  5. Citracal + D Slow Release 600 mg calcium-500  unit tablet,ext.release, 2 po qd  6. coenzyme Q10 100 mg capsule, 1 po qd  7. fluticasone 100 mcg/actuation blister powder for inhalation, Take as Directed  8. Fosamax 70 mg tablet, 1 po qweek  9. gabapentin 100 mg capsule, 1 po tid   10. losartan 25 mg tablet, 1/2 tab po qd  11. magnesium 200 mg tablet, 1 po qd  12. multivitamin capsule, 1 po qd  13. simvastatin 10 mg tablet, 1 po qhs  14. spironolactone 25 mg tablet, 1/2 tab po qd  15. Synthroid 112 mcg tablet,  1 po qd  16. torsemide 20 mg tablet, 1 po q am  17. Tylenol Extra Strength 500 mg tablet, 1 po tid  18. Vitron-C 65 mg iron-125 mg tablet,delayed release, 2 x per week  19. Xarelto 15 mg tablet, 1 po qd  __   CHIEF COMPLAINT/REASON FOR VISIT  Followup of Atrial Fibrillation, Paroxysmal, Followup of  Cardiomyopathy dilated, Followup of CHF chronic systolic, Followup of Poisoning By Loop Diuretics, Accidental  and Subs  __  HISTORY OF PRESENT ILLNESS  [name] is an 87 year old female, who presents for hospital followup. She was at [facility]  from April 05, 2017 to April 09, 2017. Our office told the patient to go to the hospital after she mistakenly took 240 mg of Lasix for two days straight. At the hospital, she was found to have a BNP of over 3,300, and a low potassium. EP was also  consulted due to an episode of V-tach that had occurred the night before on her pacemaker; however, the patient was asymptomatic. An echocardiogram was done on April 06, 2017, that showed an ejection fraction of 22%, moderately dilated LV, mild AR,  severe MR, and severe TR, as well as moderate pulmonary hypertension. Because of elevated BUN and creatinine, her Xarelto dose was lowered from 20 mg bid to 15 mg bid. The patient's carvedilol was reduced from 12.5mg  bid to 6.25mg  bid, and she was started  on amiodarone by EP. Her Lasix was discontinued and she was also started on torsemide 20 mg daily.    Since the patient has been out of the hospital, she states she  has been feeling slowly better. Yesterday was the first day that she had decreased  shortness of breath, and today she has no shortness of breath at all at rest and is back to her dry weight of 130 pounds. In the past few days, she has been urinating more regularly and the urine is clear. She has noticed some slight lower extremity edema;  however, this is her baseline.  __  PAST HISTORY     Past Medical Illnesses : celiac disease, Chronic back pain, Intermittent steroid spinal injections, COPD, hypothyroid;  Past Cardiac Illnesses : Chest pain 2007, arrythmia 2007, PFO, Atrial fibrillation-persistent, Mitral regurgitation, Cardiomyopathy, HFwREF, VT; Infectious Diseases: Staph infection  at age 50 -25, Chicken Pox; Surgical Procedures:  Tonsilectomy,Apandectomy , Knee surgery 2002, total R knee replacement, ASD Repair;  Trauma History: whip lash 1980; Cardiology Procedures-Invasive: Echo 08, Stress test 08, Cardiac Cath March  2014, Cardioversion May 2015, Cardioversion July 2015, Cardioversion for Afib August 2016, Cardiac Cath (left and right) October 2017, St Jude dual chamber ICD 05/25/16, Cardiac Cath (right) May 2018, ASD closure 11/11/16, ASD closure 11/11/16;  Cardiology Procedures-Noninvasive: CT scan 01/27/07, Treadmill Stress Test, MPI (Nuclear) Study January 2013, TEE 07/2011, 06/2012, Holter Monitor July 2015, Echocardiogram 11/2008, 06/2011, 05/2012, 07/2013, 04/2016,  10/2016, 03/2017; Cardiac Cath Results: no significant obstructive CAD, mild pulm HTN, R to L shunt with Qp:Qs 1.6, LVEDP ;  Left Ventricular Ejection Fraction: LVEF of 25% documented via echocardiogram on 04/07/2017  ___   FAMILY HISTORY  Father -- No significant family history   Mother -- Influenza/Pneumonia (cause of death)    __  CARDIAC RISK FACTORS      Tobacco Abuse: has never used tobacco; Family History of Heart Disease: positive;  Hyperlipidemia: positive; Hypertension: positive;   Diabetes Mellitus: negative; Prior History of Heart Disease: negative;  Obesity: negative; Sedentary Life Style:positive; Age :positive; Menopausal:biological menopause  __  SOCIAL HISTORY     Alcohol Use: Does not use alcohol; Smoking: Does not smoke; Never smoker ([id number]);  Diet: Regular diet and Caffeine use-1-2 per day; Lifestyle: Married, 2 sons - one in [place] and second in [place];  Exercise: Exercises occasionally;   __  REVIEW OF SYSTEMS    __   PHYSICAL EXAMINATION    Vital Signs:  Blood Pressure:   118/70 Sitting, Left arm, regular cuff  118/70 Sitting, Right arm, regular cuff    Weight: 130.00 lbs.   Height: 64"  BMI: 22   Pulse:  86/min. Apical Irregularly, irregular   Respirations: 18/min.        Constitutional: thin frail chronically ill appearing,pale, using cane Skin: warm and  dry to touch  Head: normocephalic Eyes: conjunctivae and lids normal,  Funduscopic exam and visual fields not performed ENT: No cyanosis  Neck: JVD to jaw line bilaterally Chest: no use of accessory  muscles, normal respiratory effort, coarse bibasilar rales on Rt lower lung Cardiac: Irregularly regular rhythm with variable 1st heart sound, normal 2nd  heart sound, low pitched holosystolic murmur grade 2/6 left sternal border Peripheral Pulses: pulses  full and equal in all extremities Extremities/Back: 1+ bilateral pretibial edema Psychiatric : normal memory Neurological: oriented to time, person  and place, affect appropriate   __    Medications added today by the physician:  carvedilol 12.5 mg tablet, 1 po bid, 90     IMPRESSIONS:   1. Acute on chronic heart failure with reduced ejection fraction, at dry weight goal today.  2. Atrial septal defect, status  post closure Nov 11, 2016, completed three months of Plavix.   Xarelto dose was lowered to 15mg  due to decreased renal function during hospitalization.  3. Hospitalization for GI bleed in June 2018. Source was not identified. Require blood transfusion.  Still on Xarelto now.  4. Paroxysmal atrial fibrillation,  status post failure of amiodarone, subsequent extensive left and  right atrial focus impulses rotator ablation with pulmonary vein isolation September 2017.   5. Episode of Vtach on pacemaker. Tapering a loading dose amiodarone and needs follow  up with EP   within the month.   5. CHADS-VASc score of 4 on Xarelto. Dose was reduced from 20mg  to 15mg  due to low creatinine   clearance.   6. Out of hospital cardiac arrest December 2017.  7. Nonischemic cardiomyopathy, EF 20% to  25% by last echo in May 2018, status post ICD  December 2017.  8. COPD contributing to dyspnea as well; however, significant volume overload today.  9. Acute scoliosis with moderate restriction, also with severe DLCO abnormality managed by   Dr. Stacy Gardner.     RECOMMENDATIONS:   1.  Increase carvedilol to 12.5mg  twice daily. Monitor home blood pressure and call the office if   symptomatic  hypotension. May consider switching to [facility] in the future pending recheck of renal   function.  2. Continue low-sodium diet, and check daily weight with goal dry weight of 130.  3. Will recheck a BMP in 2 weeks to assess electrolytes and  follow renal function.  4. Follow up with EP within 1 month. Continue amiodarone as prescribed until then.  5. Followup with Dr. Talmadge Chad in 1 month and close monitoring of heart failure monthly by PA or NP   thereafter. May want to consider  repeating echocardiogram at this time.     Christene Slates. Irving Shows     Tid: [id number]    cc: Danford Bad MD     AP  ____________________________  TODAYS ORDERS  Basic Metabolic Panel 1 month  12 Lead ECG Today  EP Return Visit 30 minutes within 1  month - JD or PA/NP  Return Visit 15 MIN 1 month - with SK

## 2020-10-11 ENCOUNTER — Other Ambulatory Visit: Payer: Self-pay | Admitting: Pulmonary Disease

## 2020-10-11 DIAGNOSIS — R221 Localized swelling, mass and lump, neck: Secondary | ICD-10-CM

## 2020-10-11 DIAGNOSIS — J9611 Chronic respiratory failure with hypoxia: Secondary | ICD-10-CM

## 2020-10-11 DIAGNOSIS — R053 Chronic cough: Secondary | ICD-10-CM

## 2020-10-27 ENCOUNTER — Encounter (INDEPENDENT_AMBULATORY_CARE_PROVIDER_SITE_OTHER): Payer: Medicare Other | Admitting: Cardiovascular Disease

## 2020-10-27 DIAGNOSIS — Z95810 Presence of automatic (implantable) cardiac defibrillator: Secondary | ICD-10-CM

## 2020-11-04 ENCOUNTER — Other Ambulatory Visit (INDEPENDENT_AMBULATORY_CARE_PROVIDER_SITE_OTHER): Payer: Medicare Other | Admitting: Cardiovascular Disease

## 2020-11-04 ENCOUNTER — Ambulatory Visit: Payer: Medicare Other

## 2020-11-04 LAB — REMOTE CARDIAC DEVICE MONITORING
AF Burden Percentage: 18
ICD Shock Recent Count: 0
RV Pacing Percentage: 93

## 2020-11-19 ENCOUNTER — Telehealth (INDEPENDENT_AMBULATORY_CARE_PROVIDER_SITE_OTHER): Payer: Self-pay

## 2020-11-19 NOTE — Telephone Encounter (Signed)
Refill request received for pt's Amiodarone. Noted per 09/04/20 encounter, pt confirmed she has moved to [facility] and has established care locally. Spoke with [facility] deferring refill request to pt's new pharmacy as pt has not been seen in 2 years (cardiology) and >2 years for EP. [facility] noted pt also has new provider and will f/u with pt's new care team for refill request. Thanked pharmacist for her time.

## 2020-11-25 ENCOUNTER — Other Ambulatory Visit: Payer: Medicare Other

## 2020-11-28 ENCOUNTER — Other Ambulatory Visit: Payer: Medicare Other

## 2020-11-28 ENCOUNTER — Ambulatory Visit: Payer: Medicare Other | Admitting: Oncology

## 2020-12-03 ENCOUNTER — Ambulatory Visit: Payer: Medicare Other

## 2020-12-09 ENCOUNTER — Other Ambulatory Visit: Payer: Self-pay

## 2020-12-09 ENCOUNTER — Inpatient Hospital Stay (HOSPITAL_BASED_OUTPATIENT_CLINIC_OR_DEPARTMENT_OTHER): Payer: Medicare Other | Admitting: Oncology

## 2020-12-09 ENCOUNTER — Encounter: Payer: Self-pay | Admitting: Oncology

## 2020-12-09 ENCOUNTER — Telehealth: Payer: Self-pay

## 2020-12-09 ENCOUNTER — Inpatient Hospital Stay: Payer: Medicare Other | Attending: Oncology

## 2020-12-09 VITALS — BP 106/66 | HR 69 | Temp 98.1°F | Resp 16 | Wt 125.8 lb

## 2020-12-09 DIAGNOSIS — N1832 Chronic kidney disease, stage 3b: Secondary | ICD-10-CM | POA: Insufficient documentation

## 2020-12-09 DIAGNOSIS — D649 Anemia, unspecified: Secondary | ICD-10-CM

## 2020-12-09 DIAGNOSIS — R634 Abnormal weight loss: Secondary | ICD-10-CM

## 2020-12-09 DIAGNOSIS — D631 Anemia in chronic kidney disease: Secondary | ICD-10-CM | POA: Insufficient documentation

## 2020-12-09 DIAGNOSIS — R0600 Dyspnea, unspecified: Secondary | ICD-10-CM

## 2020-12-09 DIAGNOSIS — D509 Iron deficiency anemia, unspecified: Secondary | ICD-10-CM | POA: Insufficient documentation

## 2020-12-09 DIAGNOSIS — R778 Other specified abnormalities of plasma proteins: Secondary | ICD-10-CM

## 2020-12-09 DIAGNOSIS — Z7901 Long term (current) use of anticoagulants: Secondary | ICD-10-CM | POA: Diagnosis not present

## 2020-12-09 HISTORY — DX: Iron deficiency anemia, unspecified: D50.9

## 2020-12-09 LAB — FOLATE: Folate: 20.3 ng/mL (ref >5.9)

## 2020-12-09 LAB — COMPREHENSIVE METABOLIC PANEL
ALT: 19 U/L (ref 0–44)
AST: 34 U/L (ref 15–41)
Albumin: 3.9 g/dL (ref 3.5–5.0)
Alkaline Phosphatase: 57 U/L (ref 38–126)
Anion gap: 9 (ref 5–15)
BUN: 26 mg/dL — ABNORMAL HIGH (ref 8–23)
CO2: 29 mmol/L (ref 22–32)
Calcium: 9.1 mg/dL (ref 8.9–10.3)
Chloride: 99 mmol/L (ref 98–111)
Creatinine, Ser: 1.23 mg/dL — ABNORMAL HIGH (ref 0.44–1.00)
GFR, Estimated: 43 mL/min — ABNORMAL LOW (ref >60)
Glucose, Bld: 131 mg/dL — ABNORMAL HIGH (ref 70–99)
Potassium: 4.5 mmol/L (ref 3.5–5.1)
Sodium: 137 mmol/L (ref 135–145)
Total Bilirubin: 0.9 mg/dL (ref 0.3–1.2)
Total Protein: 6.8 g/dL (ref 6.5–8.1)

## 2020-12-09 LAB — IRON AND TIBC
Iron: 48 ug/dL (ref 28–170)
Saturation Ratios: 8 % — ABNORMAL LOW (ref 10.4–31.8)
TIBC: 582 ug/dL — ABNORMAL HIGH (ref 250–450)
UIBC: 534 ug/dL

## 2020-12-09 LAB — CBC WITH DIFFERENTIAL/PLATELET
Abs Immature Granulocytes: 0.02 10*3/uL (ref 0.00–0.07)
Basophils Absolute: 0 10*3/uL (ref 0.0–0.1)
Basophils Relative: 0 %
Eosinophils Absolute: 0.1 10*3/uL (ref 0.0–0.5)
Eosinophils Relative: 2 %
HCT: 34 % — ABNORMAL LOW (ref 36.0–46.0)
Hemoglobin: 10.4 g/dL — ABNORMAL LOW (ref 12.0–15.0)
Immature Granulocytes: 0 %
Lymphocytes Relative: 19 %
Lymphs Abs: 1.1 10*3/uL (ref 0.7–4.0)
MCH: 30.4 pg (ref 26.0–34.0)
MCHC: 30.6 g/dL (ref 30.0–36.0)
MCV: 99.4 fL (ref 80.0–100.0)
Monocytes Absolute: 0.4 10*3/uL (ref 0.1–1.0)
Monocytes Relative: 8 %
Neutro Abs: 4.2 10*3/uL (ref 1.7–7.7)
Neutrophils Relative %: 71 %
Platelets: 174 10*3/uL (ref 150–400)
RBC: 3.42 MIL/uL — ABNORMAL LOW (ref 3.87–5.11)
RDW: 14.5 % (ref 11.5–15.5)
WBC: 5.9 10*3/uL (ref 4.0–10.5)
nRBC: 0 % (ref 0.0–0.2)

## 2020-12-09 LAB — VITAMIN B12: Vitamin B-12: 538 pg/mL (ref 180–914)

## 2020-12-09 LAB — FERRITIN: Ferritin: 19 ng/mL (ref 11–307)

## 2020-12-09 LAB — RETIC PANEL
Immature Retic Fract: 14.9 % (ref 2.3–15.9)
RBC.: 3.15 MIL/uL — ABNORMAL LOW (ref 3.87–5.11)
Retic Count, Absolute: 68.5 10*3/uL (ref 19.0–186.0)
Retic Ct Pct: 2.2 % (ref 0.4–3.1)
Reticulocyte Hemoglobin: 24.9 pg — ABNORMAL LOW (ref >27.9)

## 2020-12-09 LAB — LACTATE DEHYDROGENASE: LDH: 232 U/L — ABNORMAL HIGH (ref 98–192)

## 2020-12-09 NOTE — Addendum Note (Signed)
Addended by: Rickard Patience on: 12/09/2020 11:20 AM   Modules accepted: Orders

## 2020-12-09 NOTE — Addendum Note (Signed)
Addended by: Rickard Patience on: 12/09/2020 11:18 AM   Modules accepted: Orders

## 2020-12-09 NOTE — Telephone Encounter (Signed)
-----   Message from Rickard Patience, MD sent at 12/09/2020 11:22 AM EDT ----- Please let her know that her iron level is quite low and I recommend IV venofer weekly x 4. She is on blood thinner, I recommend her to see GI for evaluation, if she agrees, refer to Dr.[place] thanks.  Also she has lost 10 pounds, recommend CT abdomen pelvis wo contrast- next available. Ordered.  Follow up in 8 weeks, labs prior MD + venofer.labs ordered.

## 2020-12-09 NOTE — Telephone Encounter (Signed)
Patient notified.  Please schedule as MD recommends (Venofer will be *new*) and inform pt of appt details.

## 2020-12-09 NOTE — Progress Notes (Signed)
Hematology/Oncology Consult note [facility] Telephone:([phone] [phone]   Patient Care Team: Mick Sell, MD as PCP - General (Infectious Diseases)  REFERRING PROVIDER: Mick Sell, MD  CHIEF COMPLAINTS/REASON FOR VISIT:  Anemia  HISTORY OF PRESENTING ILLNESS:   Mackenzie Walsh is a  87 y.o.  female with PMH listed below was seen in consultation at the request of  Mick Sell, MD  for evaluation of abnormal SPEP Patient was recently seen by Dr. Adriana Simas neurosurgery for thoracic back pain with radiculopathy.  Patient has scoliosis, 3 weeks ago she started to feel severe upper back pain. X-ray of thoracic spine on 10/17/2019 showed multiple thoracic compression fractures.   11/07/2019 CT thoracic spine without contrast showed old and healed compression deformities at T1, T8 and T9.  Potentially more recent compression fracture at T3 and T7. 10/17/2019, patient had blood work-up including SPEP which showed  M spike of 3.7. CBC showed hemoglobin 11.5, MCV 107, platelet count 141.  No recent CMP Patient has CKD with creatinine baseline around 1.5.  Patient has multiple comorbidities including dilated cardiomyopathy, ventricular tachycardia, implantable cardioverter, chronic systolic CHF, CKD stage III, compression fracture, scoliosis deformity of the spine, mitral valve regurgitation, celiac disease, hypothyroidism, hypercholesterolemia, atrial fibrillation on anticoagulation, osteoporosis. She lives with her husband who has Alzheimer's disease.  Her children live in [place].  Her son lives about 30 minutes away from her home.  She gets help for grocery shopping.  She still drives herself and her husband to medical appointments.   INTERVAL HISTORY Mackenzie Walsh is a 87 y.o. female who has above history reviewed by me today presents for follow up visit for management of macrocytosis Problems and complaints are listed  below: Patient feels more shortness of breath with exertion.  She has chronic CHF and follows up with cardiology and pulmonology. Chronic fatigue unchanged.  She has lost 10 pounds since last visit.  Review of Systems  Constitutional:  Positive for fatigue and unexpected weight change. Negative for appetite change, chills and fever.  HENT:   Negative for hearing loss and voice change.   Eyes:  Negative for eye problems.  Respiratory:  Positive for shortness of breath. Negative for chest tightness and cough.   Cardiovascular:  Negative for chest pain.  Gastrointestinal:  Negative for abdominal distention, abdominal pain and blood in stool.  Endocrine: Negative for hot flashes.  Genitourinary:  Negative for difficulty urinating and frequency.   Musculoskeletal:  Positive for back pain. Negative for arthralgias.  Skin:  Negative for itching and rash.  Neurological:  Negative for extremity weakness.  Hematological:  Negative for adenopathy.  Psychiatric/Behavioral:  Negative for confusion.    MEDICAL HISTORY:  Past Medical History:  Diagnosis Date   A-fib [facility])    Allergy    Anemia    Arrhythmia    Atrial fibrillation (HCC)    Broken femur (HCC) 2017   Cataract    Celiac disease    Chronic pain syndrome    Chronic systolic CHF (congestive heart failure) (HCC)    CKD (chronic kidney disease)    COPD (chronic obstructive pulmonary disease) (HCC)    Dilated cardiomyopathy (HCC)    Hyperlipidemia    Hypertension    Migraines    Mitral valve regurgitation    Osteoporosis    Posterior rhinorrhea    Scoliosis deformity of spine    Senile hyperkeratosis    Thyroid disease    hypothyroid   Ventricular  tachyarrhythmia (HCC)     SURGICAL HISTORY: Past Surgical History:  Procedure Laterality Date   ablation arrythmia focus     APPENDECTOMY  1945   CARDIAC DEFIBRILLATOR PLACEMENT  2016   CARDIAC SURGERY     CARDIOVERSION EXTERNAL X4     PACEMAKER IMPLANT     REPLACEMENT TOTAL  KNEE RIGHT  2016   TONSILLECTOMY  1945    SOCIAL HISTORY: Social History   Socioeconomic History   Marital status: Married    Spouse name: Not on file   Number of children: Not on file   Years of education: Not on file   Highest education level: Not on file  Occupational History   Not on file  Tobacco Use   Smoking status: Never   Smokeless tobacco: Never  Vaping Use   Vaping Use: Never used  Substance and Sexual Activity   Alcohol use: Not Currently   Drug use: Never   Sexual activity: Not on file  Other Topics Concern   Not on file  Social History Narrative   Not on file   Social Determinants of Health   Financial Resource Strain: Not on file  Food Insecurity: Not on file  Transportation Needs: Not on file  Physical Activity: Not on file  Stress: Not on file  Social Connections: Not on file  Intimate Partner Violence: Not on file    FAMILY HISTORY: Family History  Problem Relation Age of Onset   Pneumonia Mother    Osteoporosis Mother    Stroke Father    Heart failure Father    Hypertension Father    Obesity Father    Heart failure Sister     ALLERGIES:  is allergic to erythromycin base, gluten meal, and dofetilide.  MEDICATIONS:  Current Outpatient Medications  Medication Sig Dispense Refill   Acetaminophen 500 MG capsule Take 2 capsules (1,000 mg total) by mouth 3 times/day as needed-between meals & bedtime. 30 capsule    albuterol (PROVENTIL) (2.5 MG/3ML) 0.083% nebulizer solution Take 3 mLs (2.5 mg total) by nebulization every 4 (four) hours as needed for wheezing or shortness of breath. 75 mL 2   amiodarone (PACERONE) 200 MG tablet Take 200 mg by mouth daily.      bifidobacterium infantis (ALIGN) capsule Take 1 capsule by mouth daily.     CALCIUM CITRATE PO Take 600 mg by mouth 2 (two) times daily as needed.     carvedilol (COREG) 12.5 MG tablet Take 12.5 mg by mouth 2 (two) times daily with a meal.      CITRACAL MAXIMUM 315-250 MG-UNIT TABS       Coenzyme Q10 (COQ-10) 100 MG CAPS Take 100 mg by mouth daily.      fluticasone (FLONASE) 50 MCG/ACT nasal spray Place 1-2 sprays into both nostrils daily.      furosemide (LASIX) 40 MG tablet Take 20 mg by mouth daily.     gabapentin (NEURONTIN) 300 MG capsule Take 300 mg by mouth 3 (three) times daily.      levothyroxine (SYNTHROID) 112 MCG tablet Take 112 mcg by mouth daily before breakfast.      losartan (COZAAR) 25 MG tablet Take 25 mg by mouth daily.      Magnesium Oxide 200 MG TABS Take 200 mg by mouth daily.      Multiple Vitamin (MULTI-VITAMIN DAILY PO) Take 1 tablet by mouth daily.      omeprazole (PRILOSEC) 40 MG capsule Take 40 mg by mouth daily.  Rivaroxaban (XARELTO) 15 MG TABS tablet Take 15 mg by mouth daily with supper.      simvastatin (ZOCOR) 10 MG tablet Take 10 mg by mouth at bedtime.     spironolactone (ALDACTONE) 25 MG tablet Take 12.5 mg by mouth daily.      No current facility-administered medications for this visit.     PHYSICAL EXAMINATION: ECOG PERFORMANCE STATUS: 1 - Symptomatic but completely ambulatory Vitals:   12/09/20 0944  BP: 106/66  Pulse: 69  Resp: 16  Temp: 98.1 F (36.7 C)   Filed Weights   12/09/20 0944  Weight: 125 lb 12.8 oz (57.1 kg)    Physical Exam Constitutional:      General: She is not in acute distress.    Comments: Frail appearance  HENT:     Head: Normocephalic and atraumatic.  Eyes:     General: No scleral icterus. Cardiovascular:     Rate and Rhythm: Normal rate and regular rhythm.     Heart sounds: Normal heart sounds.  Pulmonary:     Effort: Pulmonary effort is normal. No respiratory distress.     Breath sounds: No wheezing.  Abdominal:     General: Bowel sounds are normal. There is no distension.     Palpations: Abdomen is soft.  Musculoskeletal:        General: Normal range of motion.     Cervical back: Normal range of motion and neck supple.     Comments: Scoliosis of the spine  Skin:    General: Skin  is warm and dry.     Findings: No erythema or rash.  Neurological:     Mental Status: She is alert and oriented to person, place, and time. Mental status is at baseline.     Cranial Nerves: No cranial nerve deficit.     Coordination: Coordination normal.  Psychiatric:        Mood and Affect: Mood normal.    LABORATORY DATA:  I have reviewed the data as listed Lab Results  Component Value Date   WBC 5.9 12/09/2020   HGB 10.4 (L) 12/09/2020   HCT 34.0 (L) 12/09/2020   MCV 99.4 12/09/2020   PLT 174 12/09/2020   Recent Labs    01/16/20 0114 05/28/20 0925 12/09/20 0921  NA 137 139 137  K 4.7 4.0 4.5  CL 98 99 99  CO2 28 29 29   GLUCOSE 134* 97 131*  BUN 35* 27* 26*  CREATININE 1.66* 1.36* 1.23*  CALCIUM 9.0 9.3 9.1  GFRNONAA 28* 38* 43*  GFRAA 32*  --   --   PROT 6.6 7.0 6.8  ALBUMIN 4.0 4.2 3.9  AST 35 33 34  ALT 24 18 19   ALKPHOS 51 57 57  BILITOT 1.3* 1.0 0.9    Iron/TIBC/Ferritin/ %Sat No results found for: IRON, TIBC, FERRITIN, IRONPCTSAT    RADIOGRAPHIC STUDIES: I have personally reviewed the radiological images as listed and agreed with the findings in the report. No results found.    ASSESSMENT & PLAN:  1. Iron deficiency anemia, unspecified iron deficiency anemia type   2. Anemia in stage 3b chronic kidney disease (HCC)   3. Weight loss    #Chronic macrocytosis, resolved. However anemia has gotten worse.  Most likely due to anemia secondary to chronic kidney disease.  Rule out other etiologies. Check folate, B12, iron TIBC ferritin, reticulocyte panel  Labs showed iron deficiency anemia. Plan IV iron with Venofer 200mg  weekly x 4 doses. Allergy reactions/infusion reaction including anaphylactic  reaction discussed with patient. Other side effects include but not limited to high blood pressure, skin rash, weight gain, leg swelling, etc. Patient voices understanding and willing to proceed.  Etiology of the iron deficiency unknown, suspect chronic blood  loss from GI tract, patient is on chronic anticoagulation for atrial fibrillation which may increase her risk of GI bleeding.  #Dyspnea on exertion, multifactorial from CHF, pulmonary hypertension etc. Anemia probably attribute to that as well.  Hopefully correcting iron deficiency helps her symptoms  #Unintentional weight loss, 10 pounds since last visit 13-month ago, recommend patient to see gastroenterology for further evaluation. Check CT abdomen pelvis wo contrast  #Abnormal SPEP, resolved Patient has no M protein in blood or urine.  Serum light chain ratio was also normal.  Skeletal survey showed diffuse osteopenia which limits assessment of lytic lesion.  No evidence of focal bone lesion.No need for additional work-up at this point.  Follow-up in 8 weeks   Orders Placed This Encounter  Procedures   Iron and TIBC    Standing Status:   Future    Number of Occurrences:   1    Standing Expiration Date:   12/09/2021   Ferritin    Standing Status:   Future    Number of Occurrences:   1    Standing Expiration Date:   06/10/2021   Retic Panel    Standing Status:   Future    Number of Occurrences:   1    Standing Expiration Date:   12/09/2021    All questions were answered. The patient knows to call the clinic with any problems questions or concerns.  cc Mick Sell, MD   Rickard Patience, MD, PhD Hematology Oncology [facility] Pager- [phone] 12/09/2020  .

## 2020-12-09 NOTE — Telephone Encounter (Signed)
Referral faxed to GI.  Please schedule the CT

## 2020-12-09 NOTE — Progress Notes (Signed)
Patient denies new problems/concerns today.   °

## 2020-12-10 ENCOUNTER — Encounter: Payer: Self-pay | Admitting: Oncology

## 2020-12-13 ENCOUNTER — Inpatient Hospital Stay: Payer: Medicare Other

## 2020-12-13 ENCOUNTER — Other Ambulatory Visit: Payer: Self-pay

## 2020-12-13 VITALS — BP 95/68 | HR 71 | Temp 96.0°F | Resp 18

## 2020-12-13 DIAGNOSIS — D509 Iron deficiency anemia, unspecified: Secondary | ICD-10-CM | POA: Diagnosis not present

## 2020-12-13 MED ORDER — SODIUM CHLORIDE 0.9 % IV SOLN
Freq: Once | INTRAVENOUS | Status: AC
Start: 2020-12-13 — End: 2020-12-13
  Filled 2020-12-13: qty 250

## 2020-12-13 MED ORDER — IRON SUCROSE 20 MG/ML IV SOLN
200.0000 mg | Freq: Once | INTRAVENOUS | Status: AC
Start: 2020-12-13 — End: 2020-12-13
  Administered 2020-12-13: 200 mg via INTRAVENOUS
  Filled 2020-12-13: qty 10

## 2020-12-13 MED ORDER — SODIUM CHLORIDE 0.9 % IV SOLN: 200.0000 mg | Freq: Once | INTRAVENOUS | Status: DC

## 2020-12-13 NOTE — Progress Notes (Signed)
Pt tolerated Venofer infusion well with no signs of complications. Post Venofer infusion pt.'s BP 82/54, pt is asymptomatic and has no complaints at this time. MD made aware, no new orders given. Water given to pt to drink. BP rechecked- 95/68. Pt asymptomatic and has no complaints. MD made aware, Per MD pt may be discharged. Pt updated and all questions answered at this time. RN educated pt on the importance of notifying the clinic if any complications occur at home, pt verbalized understanding and all questions answered at this time. VSS. Pt stable for discharge.   Allysa Governale [employer]

## 2020-12-13 NOTE — Patient Instructions (Signed)
[facility] [place] [facility]  Discharge Instructions: Thank you for choosing [facility] to provide your oncology and hematology care.  If you have a lab appointment with the Cancer Center, please go directly to the Cancer Center and check in at the registration area.  Wear comfortable clothing and clothing appropriate for easy access to any Portacath or PICC line.   We strive to give you quality time with your provider. You may need to reschedule your appointment if you arrive late (15 or more minutes).  Arriving late affects you and other patients whose appointments are after yours.  Also, if you miss three or more appointments without notifying the office, you may be dismissed from the clinic at the provider's discretion.      For prescription refill requests, have your pharmacy contact our office and allow 72 hours for refills to be completed.    Today you received Venofer   To help prevent nausea and vomiting after your treatment, we encourage you to take your nausea medication as directed.  BELOW ARE SYMPTOMS THAT SHOULD BE REPORTED IMMEDIATELY: . *FEVER GREATER THAN 100.4 F (38 C) OR HIGHER . *CHILLS OR SWEATING . *NAUSEA AND VOMITING THAT IS NOT CONTROLLED WITH YOUR NAUSEA MEDICATION . *UNUSUAL SHORTNESS OF BREATH . *UNUSUAL BRUISING OR BLEEDING . *URINARY PROBLEMS (pain or burning when urinating, or frequent urination) . *BOWEL PROBLEMS (unusual diarrhea, constipation, pain near the anus) . TENDERNESS IN MOUTH AND THROAT WITH OR WITHOUT PRESENCE OF ULCERS (sore throat, sores in mouth, or a toothache) . UNUSUAL RASH, SWELLING OR PAIN  . UNUSUAL VAGINAL DISCHARGE OR ITCHING   Items with * indicate a potential emergency and should be followed up as soon as possible or go to the Emergency Department if any problems should occur.  Please show the CHEMOTHERAPY ALERT CARD or IMMUNOTHERAPY ALERT CARD at check-in to the Emergency Department and triage  nurse.  Should you have questions after your visit or need to cancel or reschedule your appointment, please contact [facility] [place] [facility]  [phone] and follow the prompts.  Office hours are 8:00 a.m. to 4:30 p.m. Monday - Friday. Please note that voicemails left after 4:00 p.m. may not be returned until the following business day.  We are closed weekends and major holidays. You have access to a nurse at all times for urgent questions. Please call the main number to the clinic [phone] and follow the prompts.  For any non-urgent questions, you may also contact your provider using MyChart. We now offer e-Visits for anyone 18 and older to request care online for non-urgent symptoms. For details visit [email or web address].   Also download the MyChart app! Go to the app store, search "MyChart", open the app, select [facility], and log in with your MyChart username and password.  Due to Covid, a mask is required upon entering the hospital/clinic. If you do not have a mask, one will be given to you upon arrival. For doctor visits, patients may have 1 support person aged 18 or older with them. For treatment visits, patients cannot have anyone with them due to current Covid guidelines and our immunocompromised population.  

## 2020-12-20 ENCOUNTER — Inpatient Hospital Stay: Payer: Medicare Other | Attending: Oncology

## 2020-12-20 VITALS — BP 104/67 | HR 69 | Temp 96.6°F | Resp 20

## 2020-12-20 DIAGNOSIS — D631 Anemia in chronic kidney disease: Secondary | ICD-10-CM | POA: Diagnosis not present

## 2020-12-20 DIAGNOSIS — N1832 Chronic kidney disease, stage 3b: Secondary | ICD-10-CM | POA: Diagnosis not present

## 2020-12-20 DIAGNOSIS — D509 Iron deficiency anemia, unspecified: Secondary | ICD-10-CM | POA: Diagnosis not present

## 2020-12-20 MED ORDER — SODIUM CHLORIDE 0.9 % IV SOLN
Freq: Once | INTRAVENOUS | Status: AC
Start: 2020-12-20 — End: 2020-12-20
  Filled 2020-12-20: qty 250

## 2020-12-20 MED ORDER — SODIUM CHLORIDE 0.9 % IV SOLN: 200.0000 mg | Freq: Once | INTRAVENOUS | Status: DC

## 2020-12-20 MED ORDER — IRON SUCROSE 20 MG/ML IV SOLN
200.0000 mg | Freq: Once | INTRAVENOUS | Status: AC
Start: 2020-12-20 — End: 2020-12-20
  Administered 2020-12-20: 200 mg via INTRAVENOUS
  Filled 2020-12-20: qty 10

## 2020-12-20 NOTE — Patient Instructions (Signed)

## 2020-12-27 ENCOUNTER — Inpatient Hospital Stay: Payer: Medicare Other

## 2020-12-27 VITALS — BP 100/50 | HR 68 | Temp 97.0°F | Resp 18

## 2020-12-27 DIAGNOSIS — D509 Iron deficiency anemia, unspecified: Secondary | ICD-10-CM

## 2020-12-27 MED ORDER — IRON SUCROSE 20 MG/ML IV SOLN
200.0000 mg | Freq: Once | INTRAVENOUS | Status: AC
Start: 2020-12-27 — End: 2020-12-27
  Administered 2020-12-27: 200 mg via INTRAVENOUS
  Filled 2020-12-27: qty 10

## 2020-12-27 MED ORDER — SODIUM CHLORIDE 0.9 % IV SOLN
Freq: Once | INTRAVENOUS | Status: AC
  Filled 2020-12-27: qty 250

## 2020-12-27 MED ORDER — SODIUM CHLORIDE 0.9 % IV SOLN: 200.0000 mg | Freq: Once | INTRAVENOUS | Status: DC

## 2020-12-27 NOTE — Patient Instructions (Signed)
[facility]ENTER [place] [facility]   Discharge Instructions: Thank you for choosing [facility] to provide your oncology and hematology care.  If you have a lab appointment with the Cancer Center, please go directly to the Cancer Center and check in at the registration area.  We strive to give you quality time with your provider. You may need to reschedule your appointment if you arrive late (15 or more minutes).  Arriving late affects you and other patients whose appointments are after yours.  Also, if you miss three or more appointments without notifying the office, you may be dismissed from the clinic at the provider's discretion.      For prescription refill requests, have your pharmacy contact our office and allow 72 hours for refills to be completed.    Today you received the following: Venofer.      BELOW ARE SYMPTOMS THAT SHOULD BE REPORTED IMMEDIATELY: *FEVER GREATER THAN 100.4 F (38 C) OR HIGHER *CHILLS OR SWEATING *NAUSEA AND VOMITING THAT IS NOT CONTROLLED WITH YOUR NAUSEA MEDICATION *UNUSUAL SHORTNESS OF BREATH *UNUSUAL BRUISING OR BLEEDING *URINARY PROBLEMS (pain or burning when urinating, or frequent urination) *BOWEL PROBLEMS (unusual diarrhea, constipation, pain near the anus) TENDERNESS IN MOUTH AND THROAT WITH OR WITHOUT PRESENCE OF ULCERS (sore throat, sores in mouth, or a toothache) UNUSUAL RASH, SWELLING OR PAIN  UNUSUAL VAGINAL DISCHARGE OR ITCHING   Items with * indicate a potential emergency and should be followed up as soon as possible or go to the Emergency Department if any problems should occur.  Should you have questions after your visit or need to cancel or reschedule your appointment, please contact [facility] [place] [facility]  [phone] and follow the prompts.  Office hours are 8:00 a.m. to 4:30 p.m. Monday - Friday. Please note that voicemails left after 4:00 p.m. may not be returned until the following  business day.  We are closed weekends and major holidays. You have access to a nurse at all times for urgent questions. Please call the main number to the clinic [phone] and follow the prompts.  For any non-urgent questions, you may also contact your provider using MyChart. We now offer e-Visits for anyone 18 and older to request care online for non-urgent symptoms. For details visit [email or web address].   Also download the MyChart app! Go to the app store, search "MyChart", open the app, select [facility], and log in with your MyChart username and password.  Due to Covid, a mask is required upon entering the hospital/clinic. If you do not have a mask, one will be given to you upon arrival. For doctor visits, patients may have 1 support person aged 18 or older with them. For treatment visits, patients cannot have anyone with them due to current Covid guidelines and our immunocompromised population.  

## 2020-12-31 ENCOUNTER — Other Ambulatory Visit: Payer: Self-pay

## 2020-12-31 ENCOUNTER — Ambulatory Visit
Admission: RE | Admit: 2020-12-31 | Discharge: 2020-12-31 | Disposition: A | Discharge status: Home / Self Care | Payer: Medicare Other | Source: Ambulatory Visit | Attending: Oncology | Admitting: Oncology

## 2020-12-31 DIAGNOSIS — R634 Abnormal weight loss: Secondary | ICD-10-CM | POA: Insufficient documentation

## 2021-01-01 ENCOUNTER — Ambulatory Visit
Admission: RE | Admit: 2021-01-01 | Discharge: 2021-01-01 | Disposition: A | Discharge status: Home / Self Care | Payer: Medicare Other | Source: Ambulatory Visit | Attending: Pulmonary Disease | Admitting: Pulmonary Disease

## 2021-01-01 DIAGNOSIS — R053 Chronic cough: Secondary | ICD-10-CM | POA: Insufficient documentation

## 2021-01-01 DIAGNOSIS — J9611 Chronic respiratory failure with hypoxia: Secondary | ICD-10-CM | POA: Diagnosis present

## 2021-01-01 DIAGNOSIS — R221 Localized swelling, mass and lump, neck: Secondary | ICD-10-CM | POA: Insufficient documentation

## 2021-01-03 ENCOUNTER — Inpatient Hospital Stay: Payer: Medicare Other

## 2021-01-03 ENCOUNTER — Other Ambulatory Visit: Payer: Self-pay

## 2021-01-03 VITALS — BP 107/71 | HR 70 | Temp 96.7°F | Resp 20

## 2021-01-03 DIAGNOSIS — D509 Iron deficiency anemia, unspecified: Secondary | ICD-10-CM | POA: Diagnosis not present

## 2021-01-03 MED ORDER — SODIUM CHLORIDE 0.9 % IV SOLN: 200.0000 mg | Freq: Once | INTRAVENOUS | Status: DC

## 2021-01-03 MED ORDER — IRON SUCROSE 20 MG/ML IV SOLN
200.0000 mg | Freq: Once | INTRAVENOUS | Status: AC
  Administered 2021-01-03: 200 mg via INTRAVENOUS
  Filled 2021-01-03: qty 10

## 2021-01-03 MED ORDER — SODIUM CHLORIDE 0.9 % IV SOLN
Freq: Once | INTRAVENOUS | Status: AC
  Filled 2021-01-03: qty 250

## 2021-01-03 NOTE — Patient Instructions (Signed)

## 2021-01-10 ENCOUNTER — Telehealth: Payer: Self-pay

## 2021-01-10 DIAGNOSIS — R1900 Intra-abdominal and pelvic swelling, mass and lump, unspecified site: Secondary | ICD-10-CM

## 2021-01-10 NOTE — Telephone Encounter (Signed)
-----   Message from Rickard Patience, MD sent at 01/09/2021  9:34 PM EDT ----- Please let her know that there is non specific finding in the pelvis that warrant further evaluation. Please arrange her to do pelvis MRI wo contrast. - reason is pelvis mass

## 2021-01-10 NOTE — Telephone Encounter (Signed)
Spoke to pt regarding obtaining MRI. Pt verbalized understanding.

## 2021-01-16 NOTE — Telephone Encounter (Signed)
Spoke to [place] at [facility] MRI and she states that since pt has pacemaker/ defib, this will need to be done at [facility] and appt will need to be set up by calling [phone]. Can you please schedule MRI and notify pt of appt. Thanks

## 2021-01-16 NOTE — Telephone Encounter (Signed)
received message from Mackenzie Walsh with Radiology stating that " patient cannot have MRI, her pacemaker is not safe. " Please advise on what you would like to do.

## 2021-01-17 ENCOUNTER — Encounter: Payer: Self-pay | Admitting: Oncology

## 2021-01-17 ENCOUNTER — Other Ambulatory Visit: Payer: Self-pay | Admitting: Oncology

## 2021-01-23 NOTE — Telephone Encounter (Signed)
MRI can not be done due to pacemaker. please switch to PET same reason pelvic mass  Pt informed that a PET will be scheduled. Please schedule and call pt with appt. Thanks

## 2021-01-23 NOTE — Addendum Note (Signed)
Addended by: Coralee Rud on: 01/23/2021 02:13 PM   Modules accepted: Orders

## 2021-01-26 DIAGNOSIS — Z95810 Presence of automatic (implantable) cardiac defibrillator: Secondary | ICD-10-CM

## 2021-02-03 ENCOUNTER — Other Ambulatory Visit (INDEPENDENT_AMBULATORY_CARE_PROVIDER_SITE_OTHER): Payer: Medicare Other | Admitting: Cardiovascular Disease

## 2021-02-03 LAB — REMOTE CARDIAC DEVICE MONITORING
AF Burden Percentage: 11
ICD Shock Recent Count: 0
RV Pacing Percentage: 93

## 2021-02-04 ENCOUNTER — Other Ambulatory Visit: Payer: Medicare Other

## 2021-02-04 ENCOUNTER — Other Ambulatory Visit: Payer: Self-pay

## 2021-02-04 ENCOUNTER — Ambulatory Visit
Admission: RE | Admit: 2021-02-04 | Discharge: 2021-02-04 | Disposition: A | Discharge status: Home / Self Care | Payer: Medicare Other | Source: Ambulatory Visit | Attending: Oncology | Admitting: Oncology

## 2021-02-04 ENCOUNTER — Ambulatory Visit: Payer: Medicare Other | Admitting: Oncology

## 2021-02-04 ENCOUNTER — Ambulatory Visit: Payer: Medicare Other

## 2021-02-04 DIAGNOSIS — Z950 Presence of cardiac pacemaker: Secondary | ICD-10-CM | POA: Insufficient documentation

## 2021-02-04 DIAGNOSIS — R1900 Intra-abdominal and pelvic swelling, mass and lump, unspecified site: Secondary | ICD-10-CM | POA: Diagnosis not present

## 2021-02-04 DIAGNOSIS — M84459A Pathological fracture, hip, unspecified, initial encounter for fracture: Secondary | ICD-10-CM | POA: Insufficient documentation

## 2021-02-04 LAB — GLUCOSE, CAPILLARY: Glucose-Capillary: 100 mg/dL — ABNORMAL HIGH (ref 70–99)

## 2021-02-04 MED ORDER — FLUDEOXYGLUCOSE F - 18 (FDG) INJECTION
6.1000 | Freq: Once | INTRAVENOUS | Status: AC | PRN
  Administered 2021-02-04: 6.6 via INTRAVENOUS

## 2021-02-05 ENCOUNTER — Inpatient Hospital Stay: Payer: Medicare Other | Attending: Oncology

## 2021-02-05 ENCOUNTER — Inpatient Hospital Stay (HOSPITAL_BASED_OUTPATIENT_CLINIC_OR_DEPARTMENT_OTHER): Payer: Medicare Other | Admitting: Oncology

## 2021-02-05 ENCOUNTER — Encounter: Payer: Self-pay | Admitting: Oncology

## 2021-02-05 ENCOUNTER — Other Ambulatory Visit
Admission: RE | Admit: 2021-02-05 | Discharge: 2021-02-05 | Disposition: A | Discharge status: Home / Self Care | Payer: Medicare Other | Source: Ambulatory Visit | Attending: Oncology | Admitting: Oncology

## 2021-02-05 ENCOUNTER — Inpatient Hospital Stay: Payer: Medicare Other

## 2021-02-05 VITALS — BP 113/92 | HR 70 | Temp 97.7°F | Resp 18 | Wt 118.2 lb

## 2021-02-05 DIAGNOSIS — Z79899 Other long term (current) drug therapy: Secondary | ICD-10-CM | POA: Diagnosis not present

## 2021-02-05 DIAGNOSIS — N1832 Chronic kidney disease, stage 3b: Secondary | ICD-10-CM | POA: Diagnosis present

## 2021-02-05 DIAGNOSIS — D631 Anemia in chronic kidney disease: Secondary | ICD-10-CM | POA: Diagnosis present

## 2021-02-05 DIAGNOSIS — D509 Iron deficiency anemia, unspecified: Secondary | ICD-10-CM | POA: Insufficient documentation

## 2021-02-05 DIAGNOSIS — R634 Abnormal weight loss: Secondary | ICD-10-CM | POA: Insufficient documentation

## 2021-02-05 DIAGNOSIS — R0600 Dyspnea, unspecified: Secondary | ICD-10-CM | POA: Diagnosis present

## 2021-02-05 DIAGNOSIS — T148XXA Other injury of unspecified body region, initial encounter: Secondary | ICD-10-CM

## 2021-02-05 DIAGNOSIS — R1900 Intra-abdominal and pelvic swelling, mass and lump, unspecified site: Secondary | ICD-10-CM | POA: Diagnosis not present

## 2021-02-05 LAB — CBC WITH DIFFERENTIAL/PLATELET
Abs Immature Granulocytes: 0.04 10*3/uL (ref 0.00–0.07)
Basophils Absolute: 0 10*3/uL (ref 0.0–0.1)
Basophils Relative: 0 %
Eosinophils Absolute: 0.1 10*3/uL (ref 0.0–0.5)
Eosinophils Relative: 2 %
HCT: 39.5 % (ref 36.0–46.0)
Hemoglobin: 12.5 g/dL (ref 12.0–15.0)
Immature Granulocytes: 1 %
Lymphocytes Relative: 25 %
Lymphs Abs: 1.2 10*3/uL (ref 0.7–4.0)
MCH: 32.5 pg (ref 26.0–34.0)
MCHC: 31.6 g/dL (ref 30.0–36.0)
MCV: 102.6 fL — ABNORMAL HIGH (ref 80.0–100.0)
Monocytes Absolute: 0.4 10*3/uL (ref 0.1–1.0)
Monocytes Relative: 7 %
Neutro Abs: 3.1 10*3/uL (ref 1.7–7.7)
Neutrophils Relative %: 65 %
Platelets: 158 10*3/uL (ref 150–400)
RBC: 3.85 MIL/uL — ABNORMAL LOW (ref 3.87–5.11)
RDW: 18.5 % — ABNORMAL HIGH (ref 11.5–15.5)
WBC: 4.8 10*3/uL (ref 4.0–10.5)
nRBC: 0 % (ref 0.0–0.2)

## 2021-02-05 LAB — IRON AND TIBC
Iron: 77 ug/dL (ref 28–170)
Saturation Ratios: 21 % (ref 10.4–31.8)
TIBC: 367 ug/dL (ref 250–450)
UIBC: 290 ug/dL

## 2021-02-05 LAB — RETIC PANEL
Immature Retic Fract: 12.1 % (ref 2.3–15.9)
RBC.: 3.79 MIL/uL — ABNORMAL LOW (ref 3.87–5.11)
Retic Count, Absolute: 50.4 10*3/uL (ref 19.0–186.0)
Retic Ct Pct: 1.3 % (ref 0.4–3.1)
Reticulocyte Hemoglobin: 35.7 pg (ref >27.9)

## 2021-02-05 LAB — FERRITIN: Ferritin: 255 ng/mL (ref 11–307)

## 2021-02-05 NOTE — Progress Notes (Signed)
Hematology/Oncology Consult note [facility] [phone] [phone]   Patient Care Team: Mick Sell, MD as PCP - General (Infectious Diseases)  REFERRING PROVIDER: Mick Sell, MD  CHIEF COMPLAINTS/REASON FOR VISIT:  Anemia, weight loss, pelvic mass  HISTORY OF PRESENTING ILLNESS:   Mackenzie Walsh is a  87 y.o.  female with PMH listed below was seen in consultation at the request of  Mick Sell, MD  for evaluation of abnormal SPEP Patient was recently seen by Dr. Adriana Simas neurosurgery for thoracic back pain with radiculopathy.  Patient has scoliosis, 3 weeks ago she started to feel severe upper back pain. X-ray of thoracic spine on 10/17/2019 showed multiple thoracic compression fractures.   11/07/2019 CT thoracic spine without contrast showed old and healed compression deformities at T1, T8 and T9.  Potentially more recent compression fracture at T3 and T7. 10/17/2019, patient had blood work-up including SPEP which showed  M spike of 3.7. CBC showed hemoglobin 11.5, MCV 107, platelet count 141.  No recent CMP Patient has CKD with creatinine baseline around 1.5.  Patient has multiple comorbidities including dilated cardiomyopathy, ventricular tachycardia, implantable cardioverter, chronic systolic CHF, CKD stage III, compression fracture, scoliosis deformity of the spine, mitral valve regurgitation, celiac disease, hypothyroidism, hypercholesterolemia, atrial fibrillation on anticoagulation, osteoporosis. She lives with her husband who has Alzheimer's disease.  Her children live in [place].  Her son lives about 30 minutes away from her home.  She gets help for grocery shopping.  She still drives herself and her husband to medical appointments.   INTERVAL HISTORY Mackenzie Walsh is a 87 y.o. female who has above history reviewed by me today presents for follow up visit for management of macrocytosis Patient  continues to lose weight.  Compared to her last visit, she has lost another 7 pounds.  Appetite is fair. Chronic shortness of breath. She reports that " I whack my side" 3 weeks ago.  Denies significant pain.  She manages to walk with her cane.  Review of Systems  Constitutional:  Positive for fatigue and unexpected weight change. Negative for appetite change, chills and fever.  HENT:   Negative for hearing loss and voice change.   Eyes:  Negative for eye problems.  Respiratory:  Positive for shortness of breath. Negative for chest tightness and cough.   Cardiovascular:  Negative for chest pain.  Gastrointestinal:  Negative for abdominal distention, abdominal pain and blood in stool.  Endocrine: Negative for hot flashes.  Genitourinary:  Negative for difficulty urinating and frequency.   Musculoskeletal:  Positive for back pain. Negative for arthralgias.  Skin:  Negative for itching and rash.  Neurological:  Negative for extremity weakness.  Hematological:  Negative for adenopathy.  Psychiatric/Behavioral:  Negative for confusion.    MEDICAL HISTORY:  Past Medical History:  Diagnosis Date   A-fib [facility])    Allergy    Anemia    Arrhythmia    Atrial fibrillation (HCC)    Broken femur (HCC) 2017   Cataract    Celiac disease    Chronic pain syndrome    Chronic systolic CHF (congestive heart failure) (HCC)    CKD (chronic kidney disease)    COPD (chronic obstructive pulmonary disease) (HCC)    Dilated cardiomyopathy (HCC)    Hyperlipidemia    Hypertension    Iron deficiency anemia 12/09/2020   Migraines    Mitral valve regurgitation    Osteoporosis    Posterior rhinorrhea    Scoliosis  deformity of spine    Senile hyperkeratosis    Thyroid disease    hypothyroid   Ventricular tachyarrhythmia (HCC)     SURGICAL HISTORY: Past Surgical History:  Procedure Laterality Date   ablation arrythmia focus     APPENDECTOMY  1945   CARDIAC DEFIBRILLATOR PLACEMENT  2016   CARDIAC  SURGERY     CARDIOVERSION EXTERNAL X4     PACEMAKER IMPLANT     REPLACEMENT TOTAL KNEE RIGHT  2016   TONSILLECTOMY  1945    SOCIAL HISTORY: Social History   Socioeconomic History   Marital status: Married    Spouse name: Not on file   Number of children: Not on file   Years of education: Not on file   Highest education level: Not on file  Occupational History   Not on file  Tobacco Use   Smoking status: Never   Smokeless tobacco: Never  Vaping Use   Vaping Use: Never used  Substance and Sexual Activity   Alcohol use: Not Currently   Drug use: Never   Sexual activity: Not on file  Other Topics Concern   Not on file  Social History Narrative   Not on file   Social Determinants of Health   Financial Resource Strain: Not on file  Food Insecurity: Not on file  Transportation Needs: Not on file  Physical Activity: Not on file  Stress: Not on file  Social Connections: Not on file  Intimate Partner Violence: Not on file    FAMILY HISTORY: Family History  Problem Relation Age of Onset   Pneumonia Mother    Osteoporosis Mother    Stroke Father    Heart failure Father    Hypertension Father    Obesity Father    Heart failure Sister     ALLERGIES:  is allergic to erythromycin base, gluten meal, and dofetilide.  MEDICATIONS:  Current Outpatient Medications  Medication Sig Dispense Refill   Acetaminophen 500 MG capsule Take 2 capsules (1,000 mg total) by mouth 3 times/day as needed-between meals & bedtime. 30 capsule    albuterol (PROVENTIL) (2.5 MG/3ML) 0.083% nebulizer solution Take 3 mLs (2.5 mg total) by nebulization every 4 (four) hours as needed for wheezing or shortness of breath. 75 mL 2   amiodarone (PACERONE) 200 MG tablet Take 200 mg by mouth daily.      bifidobacterium infantis (ALIGN) capsule Take 1 capsule by mouth daily.     CALCIUM CITRATE PO Take 600 mg by mouth 2 (two) times daily as needed.     carvedilol (COREG) 12.5 MG tablet Take 12.5 mg by  mouth 2 (two) times daily with a meal.      CITRACAL MAXIMUM 315-250 MG-UNIT TABS      Coenzyme Q10 (COQ-10) 100 MG CAPS Take 100 mg by mouth daily.      fluticasone (FLONASE) 50 MCG/ACT nasal spray Place 1-2 sprays into both nostrils daily.      furosemide (LASIX) 40 MG tablet Take 20 mg by mouth daily.     gabapentin (NEURONTIN) 300 MG capsule Take 300 mg by mouth 3 (three) times daily.      levothyroxine (SYNTHROID) 112 MCG tablet Take 112 mcg by mouth daily before breakfast.      losartan (COZAAR) 25 MG tablet Take 25 mg by mouth daily.      Magnesium Oxide 200 MG TABS Take 200 mg by mouth daily.      Multiple Vitamin (MULTI-VITAMIN DAILY PO) Take 1 tablet by mouth daily.  omeprazole (PRILOSEC) 40 MG capsule Take 40 mg by mouth daily.     Rivaroxaban (XARELTO) 15 MG TABS tablet Take 15 mg by mouth daily with supper.      simvastatin (ZOCOR) 10 MG tablet Take 10 mg by mouth at bedtime.     spironolactone (ALDACTONE) 25 MG tablet Take 12.5 mg by mouth daily.      No current facility-administered medications for this visit.     PHYSICAL EXAMINATION: ECOG PERFORMANCE STATUS: 1 - Symptomatic but completely ambulatory Vitals:   02/05/21 1017  BP: (!) 113/92  Pulse: 70  Resp: 18  Temp: 97.7 F (36.5 C)   Filed Weights   02/05/21 1017  Weight: 118 lb 3.2 oz (53.6 kg)    Physical Exam Constitutional:      General: She is not in acute distress.    Comments: Frail appearance  HENT:     Head: Normocephalic and atraumatic.  Eyes:     General: No scleral icterus. Cardiovascular:     Rate and Rhythm: Normal rate and regular rhythm.     Heart sounds: Normal heart sounds.  Pulmonary:     Effort: Pulmonary effort is normal. No respiratory distress.     Breath sounds: No wheezing.  Abdominal:     General: Bowel sounds are normal. There is no distension.     Palpations: Abdomen is soft.  Musculoskeletal:        General: Normal range of motion.     Cervical back: Normal range  of motion and neck supple.     Comments: Scoliosis of the spine  Skin:    General: Skin is warm and dry.     Findings: No erythema or rash.  Neurological:     Mental Status: She is alert and oriented to person, place, and time. Mental status is at baseline.     Cranial Nerves: No cranial nerve deficit.     Coordination: Coordination normal.  Psychiatric:        Mood and Affect: Mood normal.    LABORATORY DATA:  I have reviewed the data as listed Lab Results  Component Value Date   WBC 4.8 02/05/2021   HGB 12.5 02/05/2021   HCT 39.5 02/05/2021   MCV 102.6 (H) 02/05/2021   PLT 158 02/05/2021   Recent Labs    05/28/20 0925 12/09/20 0921  NA 139 137  K 4.0 4.5  CL 99 99  CO2 29 29  GLUCOSE 97 131*  BUN 27* 26*  CREATININE 1.36* 1.23*  CALCIUM 9.3 9.1  GFRNONAA 38* 43*  PROT 7.0 6.8  ALBUMIN 4.2 3.9  AST 33 34  ALT 18 19  ALKPHOS 57 57  BILITOT 1.0 0.9    Iron/TIBC/Ferritin/ %Sat    Component Value Date/Time   IRON 77 02/05/2021 0955   TIBC 367 02/05/2021 0955   FERRITIN 255 02/05/2021 0955   IRONPCTSAT 21 02/05/2021 0955      RADIOGRAPHIC STUDIES: I have personally reviewed the radiological images as listed and agreed with the findings in the report. NM PET Image Initial (PI) Skull Base To Thigh  Result Date: 02/04/2021 CLINICAL DATA:  Initial treatment strategy for pelvic mass. EXAM: NUCLEAR MEDICINE PET SKULL BASE TO THIGH TECHNIQUE: 6.6 mCi F-18 FDG was injected intravenously. Full-ring PET imaging was performed from the skull base to thigh after the radiotracer. CT data was obtained and used for attenuation correction and anatomic localization. Fasting blood glucose: 100 mg/dl COMPARISON:  26/37/8588. FINDINGS: Mediastinal blood pool activity: SUV  max 2.3 Liver activity: SUV max NA NECK: No hypermetabolic lymph nodes in the neck. Incidental CT findings: none CHEST: No FDG avid axillary, supraclavicular, mediastinal, or hilar lymph nodes. Within the posterior  left base there is a subpleural area of nodular the architectural distortion with surrounding scar or atelectasis measuring 1.8 cm within SUV max of 2.17, image 105/3. Along the posterolateral right base there is a focal area of pleural thickening with mild increased FDG uptake within SUV max of 2.95, image 105/3. Incidental CT findings: Areas of mild bronchiectasis and tree-in-bud nodularity noted in the right middle lobe and lingula. Patient has a left chest wall ICD. Mild cardiac enlargement. Aortic atherosclerosis and coronary artery calcifications ABDOMEN/PELVIS: No abnormal FDG uptake within the liver, pancreas, spleen or adrenal glands. No FDG avid lymph nodes within the abdomen. Corresponding to the CT abnormality there is a poorly defined area along the right pelvic sidewall which is FDG avid. This measures approximately 2.8 x 2.1 cm and has an SUV max of 5.5. Incidental CT findings: Aortic atherosclerosis.  No aneurysm. SKELETON: Bandlike area of increased uptake along the left posterior chest wall is identified with a distribution following that of the left trapezius muscle, SUV max is equal to 5.37. There is also a small intramuscular focus of increased radiotracer uptake within the left latissimus dorsi muscle at the insertion on the scapula with SUV max of 2.49. Previous ORIF of proximal left femur fracture. Large focus of heterotopic ossification is again seen arising off the lesser trochanter of the proximal left femur. This area of heterotopic ossification has fractured when compared with 12/31/2020 Incidental CT findings: none IMPRESSION: 1. The poorly defined, difficult to characterize mass along the pelvic sidewall is FDG avid within SUV max of 5.5. This localizes to the inferior margin of the right SI joint. Differential considerations for this abnormality are not changed in may include inflammatory/infectious process versus malignancy. Unfortunately, patient's ICD may preclude more definitive  characterization with contrast enhanced pelvic MRI. CT of the pelvis with contrast material may provide additional diagnostic information. 2. Two foci of increased uptake within the lower lung zones have a CT appearance favoring postinflammatory/infectious changes. 3. Intramuscular uptake is noted involving the left trapezius muscle and left latissimus muscle. Findings may be the sequelae of muscular strain or trauma. 4. Interval fracture of focal area of heterotopic ossification arising off the lesser trochanter of the proximal left femur. Correlate for any clinical signs of recent trauma. Electronically Signed   By: Signa Kell M.D.   On: 02/04/2021 16:41      ASSESSMENT & PLAN:  1. Iron deficiency anemia, unspecified iron deficiency anemia type   2. Anemia in stage 3b chronic kidney disease (HCC)   3. Pelvic mass   4. Fracture    # iron deficiency anemia. Status post IV Venofer.  She tolerates well Hemoglobin has completely normalized to 12.5.  Iron panel has improved. Discussed with her about necessity of GI work-up.  Patient is on chronic anticoagulation for atrial fibrillation.  At risk of GI bleeding. Patient has been contacted by [facility] gastroenterology for procedure.   #Dyspnea on exertion, multifactorial from CHF, pulmonary hypertension etc. symptoms are stable.  #Unintentional weight loss, she lost another 7 pounds within the last 2 months.  CT scan showed right pelvic wall mass, indeterminate.  Patient is not able to get MRI for further catheterization due to ICD.  A PET scan was obtained and I reviewed results with her. PET scan showed a  poorly defined difficult to characterize mass along right pelvic wall, SUV 5.5.  This localized to the inferior margin of the right SI joint.  Inflammation versus malignancy.  There is also 2 foci of increased uptake in the lower lung zones which probably due to inflammation. I had discussion with patient about CT-guided biopsy versus  monitoring and repeat scan in 3 months. Patient is not enthusiastic about biopsy at this point.  Considering her frailty, multiple other medical conditions, we will schedule her for follow-up in 3 months with repeat scan.    #Interval fracture of the focal area of lesser trochanter of the proximal left femur.  Urgent referral to orthopedic surgeon. We also sent a copy of PET scan report to primary care provider Dr. Jarrett Ables office.  I asked patient to talk to primary care provider if she is not able to get an orthopedic surgeon appointment soon enough.   Follow-up in CT abdomen pelvis in 3 months, lab MD +/- Venofer.   Orders Placed This Encounter  Procedures   CT Abdomen Pelvis Wo Contrast    Standing Status:   Future    Order Specific Question:   Preferred imaging location?    Answer:   [facility]    Order Specific Question:   Is Oral Contrast requested for this exam?    Answer:   Yes, Per Radiology protocol   AMB referral to orthopedics    Referral Priority:   Urgent    Referral Type:   Consultation    Referral Reason:   Specialty Services Required    Requested Specialty:   Orthopedic Surgery    Number of Visits Requested:   1    All questions were answered. The patient knows to call the clinic with any problems questions or concerns.  cc Mick Sell, MD   Rickard Patience, MD, PhD Hematology Oncology [facility] at [facility]  02/05/2021  .

## 2021-02-05 NOTE — Progress Notes (Signed)
Pt here for follow up. Pt reports having trouble swallowing

## 2021-02-12 ENCOUNTER — Encounter: Payer: Self-pay | Admitting: *Deleted

## 2021-02-13 ENCOUNTER — Ambulatory Visit: Admission: RE | Admit: 2021-02-13 | Payer: Medicare Other | Source: Home / Self Care

## 2021-02-13 ENCOUNTER — Encounter: Admission: RE | Payer: Self-pay | Source: Home / Self Care

## 2021-02-13 HISTORY — DX: Presence of automatic (implantable) cardiac defibrillator: Z95.810

## 2021-02-13 HISTORY — DX: Presence of cardiac pacemaker: Z95.0

## 2021-02-13 SURGERY — COLONOSCOPY
Anesthesia: General

## 2021-02-18 ENCOUNTER — Telehealth: Payer: Self-pay

## 2021-02-18 NOTE — Telephone Encounter (Signed)
Received fax notification from [facility] orthopedics that patient has declined to schedule an appt at this time.

## 2021-04-09 ENCOUNTER — Encounter: Payer: Self-pay | Admitting: Internal Medicine

## 2021-04-10 ENCOUNTER — Encounter
Admission: RE | Disposition: A | Discharge status: Home / Self Care | Payer: Self-pay | Source: Home / Self Care | Attending: Gastroenterology

## 2021-04-10 ENCOUNTER — Encounter: Payer: Self-pay | Admitting: Internal Medicine

## 2021-04-10 ENCOUNTER — Ambulatory Visit: Payer: Medicare Other | Admitting: Anesthesiology

## 2021-04-10 ENCOUNTER — Ambulatory Visit
Admission: RE | Admit: 2021-04-10 | Discharge: 2021-04-10 | Disposition: A | Discharge status: Home / Self Care | Payer: Medicare Other | Attending: Gastroenterology | Admitting: Gastroenterology

## 2021-04-10 DIAGNOSIS — Z79899 Other long term (current) drug therapy: Secondary | ICD-10-CM | POA: Diagnosis not present

## 2021-04-10 DIAGNOSIS — D509 Iron deficiency anemia, unspecified: Secondary | ICD-10-CM | POA: Diagnosis not present

## 2021-04-10 DIAGNOSIS — R197 Diarrhea, unspecified: Secondary | ICD-10-CM | POA: Diagnosis not present

## 2021-04-10 DIAGNOSIS — Z79890 Hormone replacement therapy (postmenopausal): Secondary | ICD-10-CM | POA: Insufficient documentation

## 2021-04-10 DIAGNOSIS — Z96651 Presence of right artificial knee joint: Secondary | ICD-10-CM | POA: Diagnosis not present

## 2021-04-10 DIAGNOSIS — R634 Abnormal weight loss: Secondary | ICD-10-CM | POA: Insufficient documentation

## 2021-04-10 DIAGNOSIS — Z888 Allergy status to other drugs, medicaments and biological substances status: Secondary | ICD-10-CM | POA: Insufficient documentation

## 2021-04-10 DIAGNOSIS — Z6821 Body mass index (BMI) 21.0-21.9, adult: Secondary | ICD-10-CM | POA: Insufficient documentation

## 2021-04-10 DIAGNOSIS — K900 Celiac disease: Secondary | ICD-10-CM | POA: Insufficient documentation

## 2021-04-10 DIAGNOSIS — Z881 Allergy status to other antibiotic agents status: Secondary | ICD-10-CM | POA: Diagnosis not present

## 2021-04-10 DIAGNOSIS — K224 Dyskinesia of esophagus: Secondary | ICD-10-CM | POA: Insufficient documentation

## 2021-04-10 DIAGNOSIS — K640 First degree hemorrhoids: Secondary | ICD-10-CM | POA: Diagnosis not present

## 2021-04-10 DIAGNOSIS — Z9981 Dependence on supplemental oxygen: Secondary | ICD-10-CM | POA: Diagnosis not present

## 2021-04-10 DIAGNOSIS — Z95810 Presence of automatic (implantable) cardiac defibrillator: Secondary | ICD-10-CM | POA: Diagnosis not present

## 2021-04-10 DIAGNOSIS — I4891 Unspecified atrial fibrillation: Secondary | ICD-10-CM | POA: Insufficient documentation

## 2021-04-10 DIAGNOSIS — K529 Noninfective gastroenteritis and colitis, unspecified: Secondary | ICD-10-CM | POA: Diagnosis not present

## 2021-04-10 DIAGNOSIS — K2950 Unspecified chronic gastritis without bleeding: Secondary | ICD-10-CM | POA: Insufficient documentation

## 2021-04-10 DIAGNOSIS — K5520 Angiodysplasia of colon without hemorrhage: Secondary | ICD-10-CM | POA: Diagnosis not present

## 2021-04-10 DIAGNOSIS — Z7901 Long term (current) use of anticoagulants: Secondary | ICD-10-CM | POA: Diagnosis not present

## 2021-04-10 DIAGNOSIS — Z951 Presence of aortocoronary bypass graft: Secondary | ICD-10-CM | POA: Insufficient documentation

## 2021-04-10 HISTORY — PX: COLONOSCOPY: SHX5424

## 2021-04-10 HISTORY — DX: Cardiac arrhythmia, unspecified: I49.9

## 2021-04-10 HISTORY — PX: ESOPHAGOGASTRODUODENOSCOPY (EGD) WITH PROPOFOL: SHX5813

## 2021-04-10 SURGERY — COLONOSCOPY
Anesthesia: General

## 2021-04-10 MED ORDER — PROPOFOL 500 MG/50ML IV EMUL
INTRAVENOUS | Status: AC
  Filled 2021-04-10: qty 50

## 2021-04-10 MED ORDER — LIDOCAINE HCL (CARDIAC) PF 100 MG/5ML IV SOSY
PREFILLED_SYRINGE | INTRAVENOUS | Status: DC | PRN
  Administered 2021-04-10: 40 mg via INTRAVENOUS

## 2021-04-10 MED ORDER — PROPOFOL 500 MG/50ML IV EMUL
INTRAVENOUS | Status: DC | PRN
  Administered 2021-04-10: 120 ug/kg/min via INTRAVENOUS

## 2021-04-10 MED ORDER — LIDOCAINE HCL (PF) 2 % IJ SOLN
INTRAMUSCULAR | Status: AC
  Filled 2021-04-10: qty 5

## 2021-04-10 MED ORDER — SODIUM CHLORIDE 0.9 % IV SOLN: INTRAVENOUS | Status: DC

## 2021-04-10 MED ORDER — EPHEDRINE 5 MG/ML INJ
INTRAVENOUS | Status: AC
  Filled 2021-04-10: qty 5

## 2021-04-10 MED ORDER — EPHEDRINE SULFATE 50 MG/ML IJ SOLN
INTRAMUSCULAR | Status: DC | PRN
  Administered 2021-04-10: 5 mg via INTRAVENOUS
  Administered 2021-04-10: 10 mg via INTRAVENOUS
  Administered 2021-04-10 (×2): 5 mg via INTRAVENOUS

## 2021-04-10 MED ORDER — STERILE WATER FOR IRRIGATION IR SOLN
Status: DC | PRN
  Administered 2021-04-10: 60 mL

## 2021-04-10 NOTE — Anesthesia Postprocedure Evaluation (Signed)
Anesthesia Post Note  Patient: Mackenzie Walsh  Procedure(s) Performed: COLONOSCOPY ESOPHAGOGASTRODUODENOSCOPY (EGD) WITH PROPOFOL  Patient location during evaluation: Endoscopy Anesthesia Type: General Level of consciousness: awake and alert Pain management: pain level controlled Vital Signs Assessment: post-procedure vital signs reviewed and stable Respiratory status: spontaneous breathing, nonlabored ventilation, respiratory function stable and patient connected to nasal cannula oxygen Cardiovascular status: blood pressure returned to baseline and stable Postop Assessment: no apparent nausea or vomiting Anesthetic complications: no   No notable events documented.   Last Vitals:  Vitals:   04/10/21 0804 04/10/21 1014  BP: (!) 114/96 (!) 87/59  Pulse: 70 89  Resp: 18 17  Temp: (!) 35.6 C (!) 35.6 C  SpO2: 91% 100%    Last Pain:  Vitals:   04/10/21 0804  TempSrc: Temporal                 Cleda Mccreedy Christan Defranco

## 2021-04-10 NOTE — Anesthesia Procedure Notes (Signed)
Date/Time: 04/10/2021 9:35 AM Performed by: Tonia Ghent Pre-anesthesia Checklist: Patient identified, Emergency Drugs available, Suction available, Patient being monitored and Timeout performed Patient Re-evaluated:Patient Re-evaluated prior to induction Oxygen Delivery Method: Nasal cannula Preoxygenation: Pre-oxygenation with 100% oxygen Induction Type: IV induction Airway Equipment and Method: Bite block Placement Confirmation: positive ETCO2 and CO2 detector

## 2021-04-10 NOTE — Interval H&P Note (Signed)
History and Physical Interval Note: Preprocedure H&P from 04/10/21  was reviewed and there was no interval change after seeing and examining the patient.  Written consent was obtained from the patient after discussion of risks, benefits, and alternatives. Patient has consented to proceed with Esophagogastroduodenoscopy and Colonoscopy with possible intervention   04/10/2021 9:19 AM  Mackenzie Walsh  has presented today for surgery, with the diagnosis of (D50.9) IRON DEFICIENCY ANEMIA (R63.4) UNINTENTIONAL WEIGHT LOSS.  The various methods of treatment have been discussed with the patient and family. After consideration of risks, benefits and other options for treatment, the patient has consented to  Procedure(s): COLONOSCOPY (N/A) ESOPHAGOGASTRODUODENOSCOPY (EGD) WITH PROPOFOL (N/A) as a surgical intervention.  The patient's history has been reviewed, patient examined, no change in status, stable for surgery.  I have reviewed the patient's chart and labs.  Questions were answered to the patient's satisfaction.     Jaynie Collins

## 2021-04-10 NOTE — Op Note (Signed)
[facility] Patient Name: Mackenzie Walsh Procedure Date: 04/10/2021 9:07 AM MRN: [id number] Account #: [id number] Date of Birth: 04/01/1932 Admit Type: Outpatient Age: 87 Room: [facility] ENDO ROOM 1 Gender: Female Note Status: Finalized Instrument Name: Upper Endoscope [id number] Procedure:             Upper GI endoscopy Indications:           Iron deficiency anemia, Celiac disease, Weight loss Providers:             Rueben Bash, DO Referring MD:          Adrian Prows (Referring MD) Medicines:             Monitored Anesthesia Care Complications:         No immediate complications. Estimated blood loss:                         Minimal. Procedure:             Pre-Anesthesia Assessment:                        - Prior to the procedure, a History and Physical was                         performed, and patient medications and allergies were                         reviewed. The patient is competent. The risks and                         benefits of the procedure and the sedation options and                         risks were discussed with the patient. All questions                         were answered and informed consent was obtained.                         Patient identification and proposed procedure were                         verified by the physician, the nurse, the anesthetist                         and the technician in the endoscopy suite. Mental                         Status Examination: alert and oriented. Airway                         Examination: normal oropharyngeal airway and neck                         mobility. Respiratory Examination: clear to                         auscultation. CV Examination: RRR, no murmurs, no S3  or S4. Prophylactic Antibiotics: The patient does not                         require prophylactic antibiotics. Prior                         Anticoagulants: The patient has  taken Xarelto                         (rivaroxaban), last dose was 3 days prior to                         procedure. ASA Grade Assessment: III - A patient with                         severe systemic disease. After reviewing the risks and                         benefits, the patient was deemed in satisfactory                         condition to undergo the procedure. The anesthesia                         plan was to use monitored anesthesia care (MAC).                         Immediately prior to administration of medications,                         the patient was re-assessed for adequacy to receive                         sedatives. The heart rate, respiratory rate, oxygen                         saturations, blood pressure, adequacy of pulmonary                         ventilation, and response to care were monitored                         throughout the procedure. The physical status of the                         patient was re-assessed after the procedure.                        After obtaining informed consent, the endoscope was                         passed under direct vision. Throughout the procedure,                         the patient's blood pressure, pulse, and oxygen                         saturations were monitored continuously. The Endoscope  was introduced through the mouth, and advanced to the                         second part of duodenum. The upper GI endoscopy was                         accomplished without difficulty. The patient tolerated                         the procedure well. Findings:      The duodenal bulb, first portion of the duodenum and second portion of       the duodenum were normal. Biopsies for histology were taken with a cold       forceps for evaluation of celiac disease. Estimated blood loss was       minimal.      Scattered mild inflammation characterized by erythema was found in the       entire examined  stomach. Biopsies were taken with a cold forceps for       Helicobacter pylori testing. Estimated blood loss was minimal.      The Z-line was regular and was found 37 cm from the incisors.      Esophagogastric landmarks were identified: the gastroesophageal junction       was found at 37 cm from the incisors.      Abnormal motility was noted in the esophagus. The cricopharyngeus was       normal. There is tertiary contractions of the esophageal body. The       distal esophagus/lower esophageal sphincter is open. Tertiary       peristaltic waves are noted. Biopsies were taken with a cold forceps for       histology. Estimated blood loss was minimal.      Normal mucosa was found in the entire esophagus. A guidewire was placed       and the scope was withdrawn. Dilation was performed with a Savary       dilator with no resistance at 51 Fr. The dilation site was examined       following endoscope reinsertion and showed mild mucosal disruption.       Estimated blood loss was minimal. Impression:            - Normal duodenal bulb, first portion of the duodenum                         and second portion of the duodenum. Biopsied.                        - Gastritis. Biopsied.                        - Z-line regular, 37 cm from the incisors.                        - Esophagogastric landmarks identified.                        - Abnormal esophageal motility. Biopsied.                        - Normal mucosa was found in the entire esophagus.  Dilated. Recommendation:        - Discharge patient to home.                        - Resume previous diet and soft diet today.                        - Resume Xarelto (rivaroxaban) at prior dose tomorrow.                         Refer to referring physician for further adjustment of                         therapy.                        - Continue present medications.                        - Await pathology results.                         - Return to referring physician as previously                         scheduled. Procedure Code(s):     --- Professional ---                        [phone], Esophagogastroduodenoscopy, flexible,                         transoral; with insertion of guide wire followed by                         passage of dilator(s) through esophagus over guide wire                        [id number], 59,51, Esophagogastroduodenoscopy, flexible,                         transoral; with biopsy, single or multiple Diagnosis Code(s):     --- Professional ---                        K29.70, Gastritis, unspecified, without bleeding                        K22.4, Dyskinesia of esophagus                        D50.9, Iron deficiency anemia, unspecified                        K90.0, Celiac disease                        R63.4, Abnormal weight loss CPT copyright 2019 American [employer]. All rights reserved. The codes documented in this report are preliminary and upon coder review may  be revised to meet current compliance requirements. Attending Participation:      I personally performed the entire procedure. Volney American, DO Annamaria Helling DO, DO 04/10/2021 10:20:44 AM This report  has been signed electronically. Number of Addenda: 0 Note Initiated On: 04/10/2021 9:07 AM Estimated Blood Loss:  Estimated blood loss was minimal.      [facility]

## 2021-04-10 NOTE — Anesthesia Preprocedure Evaluation (Addendum)
Anesthesia Evaluation  Patient identified by MRN, date of birth, ID band Patient awake    Reviewed: Allergy & Precautions, NPO status , Patient's Chart, lab work & pertinent test results  History of Anesthesia Complications Negative for: history of anesthetic complications  Airway Mallampati: III  TM Distance: >3 FB Neck ROM: limited    Dental  (+) Chipped, Poor Dentition, Missing   Pulmonary COPD,  COPD inhaler and oxygen dependent,    Pulmonary exam normal        Cardiovascular hypertension, (-) angina+CHF  + dysrhythmias Atrial Fibrillation + pacemaker + Cardiac Defibrillator      Neuro/Psych  Headaches, negative psych ROS   GI/Hepatic negative GI ROS, Neg liver ROS, neg GERD  ,  Endo/Other  negative endocrine ROS  Renal/GU Renal disease  negative genitourinary   Musculoskeletal   Abdominal   Peds  Hematology  (+) Blood dyscrasia, anemia ,   Anesthesia Other Findings Past Medical History: No date: A-fib (HCC) No date: AICD (automatic cardioverter/defibrillator) present No date: Allergy No date: Anemia No date: Arrhythmia No date: Atrial fibrillation (HCC) 2017: Broken femur (HCC) No date: Cataract No date: Celiac disease No date: Chronic pain syndrome No date: Chronic systolic CHF (congestive heart failure) (HCC) No date: CKD (chronic kidney disease) No date: COPD (chronic obstructive pulmonary disease) (HCC) No date: Dilated cardiomyopathy (HCC) No date: Dysrhythmia No date: Hyperlipidemia No date: Hypertension 12/09/2020: Iron deficiency anemia No date: Migraines No date: Mitral valve regurgitation No date: Osteoporosis No date: Posterior rhinorrhea No date: Presence of permanent cardiac pacemaker No date: Scoliosis deformity of spine No date: Senile hyperkeratosis No date: Thyroid disease     Comment:  hypothyroid No date: Ventricular tachyarrhythmia  Past Surgical History: No date:  ablation arrythmia focus 1945: APPENDECTOMY 2016: CARDIAC DEFIBRILLATOR PLACEMENT No date: CARDIAC SURGERY No date: CARDIOVERSION EXTERNAL X4 No date: CORONARY ARTERY BYPASS GRAFT No date: PACEMAKER IMPLANT 2016: REPLACEMENT TOTAL KNEE RIGHT 1945: TONSILLECTOMY     Reproductive/Obstetrics negative OB ROS                            Anesthesia Physical Anesthesia Plan  ASA: 3  Anesthesia Plan: General   Post-op Pain Management:    Induction: Intravenous  PONV Risk Score and Plan: Propofol infusion and TIVA  Airway Management Planned: Natural Airway and Nasal Cannula  Additional Equipment:   Intra-op Plan:   Post-operative Plan:   Informed Consent: I have reviewed the patients History and Physical, chart, labs and discussed the procedure including the risks, benefits and alternatives for the proposed anesthesia with the patient or authorized representative who has indicated his/her understanding and acceptance.     Dental Advisory Given  Plan Discussed with: Anesthesiologist, CRNA and Surgeon  Anesthesia Plan Comments: (Patient consented for risks of anesthesia including but not limited to:  - adverse reactions to medications - risk of airway placement if required - damage to eyes, teeth, lips or other oral mucosa - nerve damage due to positioning  - sore throat or hoarseness - Damage to heart, brain, nerves, lungs, other parts of body or loss of life  Patient voiced understanding.)        Anesthesia Quick Evaluation

## 2021-04-10 NOTE — Op Note (Signed)
[facility] Gastroenterology Patient Name: Mackenzie Walsh Procedure Date: 04/10/2021 9:06 AM MRN: [id number] Account #: [id number] Date of Birth: 21-Sep-1931 Admit Type: Outpatient Age: 87 Room: [facility] ENDO ROOM 1 Gender: Female Note Status: Finalized Instrument Name: Colonoscope [id number] Procedure:             Colonoscopy Indications:           Diarrhea, Iron deficiency anemia, Weight loss Providers:             Rueben Bash, DO Referring MD:          Adrian Prows (Referring MD) Medicines:             Monitored Anesthesia Care Complications:         No immediate complications. Estimated blood loss:                         Minimal. Procedure:             Pre-Anesthesia Assessment:                        - Prior to the procedure, a History and Physical was                         performed, and patient medications and allergies were                         reviewed. The patient is competent. The risks and                         benefits of the procedure and the sedation options and                         risks were discussed with the patient. All questions                         were answered and informed consent was obtained.                         Patient identification and proposed procedure were                         verified by the physician, the nurse, the anesthetist                         and the technician in the endoscopy suite. Mental                         Status Examination: alert and oriented. Airway                         Examination: normal oropharyngeal airway and neck                         mobility. Respiratory Examination: clear to                         auscultation. CV Examination: RRR, no murmurs, no S3  or S4. Prophylactic Antibiotics: The patient does not                         require prophylactic antibiotics. Prior                         Anticoagulants: The patient has taken Xarelto                          (rivaroxaban), last dose was 3 days prior to                         procedure. ASA Grade Assessment: III - A patient with                         severe systemic disease. After reviewing the risks and                         benefits, the patient was deemed in satisfactory                         condition to undergo the procedure. The anesthesia                         plan was to use monitored anesthesia care (MAC).                         Immediately prior to administration of medications,                         the patient was re-assessed for adequacy to receive                         sedatives. The heart rate, respiratory rate, oxygen                         saturations, blood pressure, adequacy of pulmonary                         ventilation, and response to care were monitored                         throughout the procedure. The physical status of the                         patient was re-assessed after the procedure.                        After obtaining informed consent, the colonoscope was                         passed under direct vision. Throughout the procedure,                         the patient's blood pressure, pulse, and oxygen                         saturations were monitored continuously. The  Colonoscope was introduced through the anus and                         advanced to the the terminal ileum, with                         identification of the appendiceal orifice and IC                         valve. The colonoscopy was performed without                         difficulty. The patient tolerated the procedure well.                         The quality of the bowel preparation was evaluated                         using the BBPS [facility] Bowel Preparation Scale) with                         scores of: Right Colon = 3, Transverse Colon = 3 and                         Left Colon = 3 (entire mucosa seen well with no                          residual staining, small fragments of stool or opaque                         liquid). The total BBPS score equals 9. Findings:      The perianal and digital rectal examinations were normal. Pertinent       negatives include normal sphincter tone.      The terminal ileum appeared normal.      Non-bleeding internal hemorrhoids were found during retroflexion. The       hemorrhoids were Grade I (internal hemorrhoids that do not prolapse).       Estimated blood loss: none.      One small localized angioectasia without bleeding was found in the       descending colon. Estimated blood loss: none.      Normal mucosa was found in the entire colon. Biopsies for histology were       taken with a cold forceps from the right colon and left colon for       evaluation of microscopic colitis. Estimated blood loss was minimal.      The exam was otherwise without abnormality on direct and retroflexion       views. Impression:            - The examined portion of the ileum was normal.                        - Non-bleeding internal hemorrhoids.                        - One non-bleeding colonic angioectasia.                        -  Normal mucosa in the entire examined colon. Biopsied.                        - The examination was otherwise normal on direct and                         retroflexion views. Recommendation:        - Discharge patient to home.                        - Resume previous diet.                        - Continue present medications.                        - Resume Xarelto (rivaroxaban) at prior dose tomorrow.                         Refer to referring physician for further adjustment of                         therapy.                        - Await pathology results.                        - Repeat colonoscopy for surveillance based on                         pathology results.                        - Return to referring physician as previously                          scheduled. Procedure Code(s):     --- Professional ---                        [phone], Colonoscopy, flexible; with biopsy, single or                         multiple Diagnosis Code(s):     --- Professional ---                        K64.0, First degree hemorrhoids                        K55.20, Angiodysplasia of colon without hemorrhage                        R19.7, Diarrhea, unspecified                        D50.9, Iron deficiency anemia, unspecified                        R63.4, Abnormal weight loss CPT copyright 2019 American Medical Association. All rights reserved. The codes documented in this report are preliminary and upon coder review may  be revised to meet current compliance requirements.  Attending Participation:      I personally performed the entire procedure. Volney American, DO Annamaria Helling DO, DO 04/10/2021 10:24:22 AM This report has been signed electronically. Number of Addenda: 0 Note Initiated On: 04/10/2021 9:06 AM Scope Withdrawal Time: 0 hours 9 minutes 8 seconds  Total Procedure Duration: 0 hours 25 minutes 52 seconds  Estimated Blood Loss:  Estimated blood loss was minimal.      [facility]

## 2021-04-10 NOTE — Transfer of Care (Signed)
Immediate Anesthesia Transfer of Care Note  Patient: Mackenzie Walsh  Procedure(s) Performed: COLONOSCOPY ESOPHAGOGASTRODUODENOSCOPY (EGD) WITH PROPOFOL  Patient Location: PACU  Anesthesia Type:General  Level of Consciousness: awake and sedated  Airway & Oxygen Therapy: Patient Spontanous Breathing and Patient connected to nasal cannula oxygen  Post-op Assessment: Report given to RN  Post vital signs: Reviewed and stable  Last Vitals:  Vitals Value Taken Time  BP    Temp    Pulse    Resp    SpO2      Last Pain:  Vitals:   04/10/21 0804  TempSrc: Temporal         Complications: No notable events documented.

## 2021-04-10 NOTE — H&P (Signed)
[facility] Pre-Procedure H&P   Patient ID: Mackenzie Walsh is a 87 y.o. female.  Gastroenterology Provider: Jaynie Collins, DO  Referring Provider: Jacob Moores, PA PCP: Mackenzie Sell, MD  Date: 04/10/2021  HPI [name] is a 87 y.o. female who presents today for Esophagogastroduodenoscopy and Colonoscopy for iron deficiency anemia, dysphagia, reflux, weight loss, diarrhea.  Loose stool, trouble with solids/liquids/pills in swallowing but no pain. No blood in the stool. Weight ahs gone from 140 to 125. Hgb 10.4 ferritin 19 iron sat 8%. H/o celiac disease per patient. Last colon >10 y ago. GFR 31. A fib, but oac has been held for 3 days. EF 40-45%.  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 No other acute gi complaints.   Past Medical History:  Diagnosis Date   A-fib [facility])    AICD (automatic cardioverter/defibrillator) present    Allergy    Anemia    Arrhythmia    Atrial fibrillation (HCC)    Broken femur (HCC) 2017   Cataract    Celiac disease    Chronic pain syndrome    Chronic systolic CHF (congestive heart failure) (HCC)    CKD (chronic kidney disease)    COPD (chronic obstructive pulmonary disease) (HCC)    Dilated cardiomyopathy (HCC)    Dysrhythmia    Hyperlipidemia    Hypertension    Iron deficiency anemia 12/09/2020   Migraines    Mitral valve regurgitation    Osteoporosis    Posterior rhinorrhea    Presence of permanent cardiac pacemaker    Scoliosis deformity of spine    Senile hyperkeratosis    Thyroid disease    hypothyroid   Ventricular tachyarrhythmia     Past Surgical History:  Procedure Laterality Date   ablation arrythmia focus     APPENDECTOMY  1945   CARDIAC DEFIBRILLATOR PLACEMENT  2016   CARDIAC SURGERY     CARDIOVERSION EXTERNAL X4     CORONARY ARTERY BYPASS GRAFT     PACEMAKER IMPLANT     REPLACEMENT TOTAL KNEE RIGHT  2016   TONSILLECTOMY  1945    Family History No h/o GI disease or  malignancy  Review of Systems  Constitutional:  Positive for unexpected weight change. Negative for activity change, appetite change, fatigue and fever.  HENT:  Positive for nosebleeds, trouble swallowing and voice change.   Respiratory:  Negative for shortness of breath and wheezing.   Cardiovascular:  Negative for chest pain and palpitations.  Gastrointestinal:  Negative for abdominal distention, abdominal pain, anal bleeding, blood in stool, constipation, diarrhea, nausea, rectal pain and vomiting.       Reflux  Musculoskeletal:  Negative for arthralgias and myalgias.  Skin:  Negative for color change and pallor.  Neurological:  Negative for dizziness, syncope and weakness.  Psychiatric/Behavioral:  Negative for confusion.   All other systems reviewed and are negative.   Medications No current facility-administered medications on file prior to encounter.   Current Outpatient Medications on File Prior to Encounter  Medication Sig Dispense Refill   amiodarone (PACERONE) 200 MG tablet Take 200 mg by mouth daily.      furosemide (LASIX) 40 MG tablet Take 20 mg by mouth daily.     gabapentin (NEURONTIN) 300 MG capsule Take 300 mg by mouth 3 (three) times daily.      levothyroxine (SYNTHROID) 112 MCG tablet Take 112 mcg by mouth daily before breakfast.      losartan (COZAAR) 25 MG tablet Take 25 mg by mouth daily.  omeprazole (PRILOSEC) 40 MG capsule Take 40 mg by mouth daily.     simvastatin (ZOCOR) 10 MG tablet Take 10 mg by mouth at bedtime.     Acetaminophen 500 MG capsule Take 2 capsules (1,000 mg total) by mouth 3 times/day as needed-between meals & bedtime. 30 capsule    albuterol (PROVENTIL) (2.5 MG/3ML) 0.083% nebulizer solution Take 3 mLs (2.5 mg total) by nebulization every 4 (four) hours as needed for wheezing or shortness of breath. 75 mL 2   bifidobacterium infantis (ALIGN) capsule Take 1 capsule by mouth daily.     CALCIUM CITRATE PO Take 600 mg by mouth 2 (two) times  daily as needed.     carvedilol (COREG) 12.5 MG tablet Take 12.5 mg by mouth 2 (two) times daily with a meal.      CITRACAL MAXIMUM 315-250 MG-UNIT TABS      Coenzyme Q10 (COQ-10) 100 MG CAPS Take 100 mg by mouth daily.      fluticasone (FLONASE) 50 MCG/ACT nasal spray Place 1-2 sprays into both nostrils daily.      Magnesium Oxide 200 MG TABS Take 200 mg by mouth daily.      Multiple Vitamin (MULTI-VITAMIN DAILY PO) Take 1 tablet by mouth daily.      Rivaroxaban (XARELTO) 15 MG TABS tablet Take 15 mg by mouth daily with supper.      spironolactone (ALDACTONE) 25 MG tablet Take 12.5 mg by mouth daily.       Pertinent medications related to GI and procedure were reviewed by me with the patient prior to the procedure   Current Facility-Administered Medications:    0.9 %  sodium chloride infusion, , Intravenous, Continuous, Mackenzie Collins, DO, Last Rate: 20 mL/hr at 04/10/21 9604, New Bag at 04/10/21 5409  sodium chloride 20 mL/hr at 04/10/21 8119       Allergies  Allergen Reactions   Erythromycin Base Hives   Gluten Meal Other (See Comments)    Celiac Disease   Dofetilide Rash   Allergies were reviewed by me prior to the procedure  Objective    Vitals:   04/10/21 0804  BP: (!) 114/96  Pulse: 70  Resp: 18  Temp: (!) 96 F (35.6 C)  TempSrc: Temporal  SpO2: 91%  Weight: 52.2 kg  Height: 5\' 2"  (1.575 m)     Physical Exam Vitals and nursing note reviewed.  Constitutional:      General: She is not in acute distress.    Appearance: She is not ill-appearing, toxic-appearing or diaphoretic.  HENT:     Head: Normocephalic and atraumatic.     Nose: Nose normal.     Mouth/Throat:     Mouth: Mucous membranes are moist.     Pharynx: Oropharynx is clear.  Eyes:     General: No scleral icterus.    Extraocular Movements: Extraocular movements intact.  Cardiovascular:     Rate and Rhythm: Normal rate and regular rhythm.     Heart sounds: Normal heart sounds. No  murmur heard.   No friction rub. No gallop.  Pulmonary:     Effort: Pulmonary effort is normal. No respiratory distress.     Breath sounds: Normal breath sounds. No wheezing, rhonchi or rales.  Abdominal:     General: Abdomen is flat. Bowel sounds are normal. There is no distension.     Palpations: Abdomen is soft.     Tenderness: There is no abdominal tenderness. There is no guarding or rebound.  Musculoskeletal:  Cervical back: Neck supple.     Right lower leg: No edema.     Left lower leg: No edema.  Skin:    General: Skin is warm and dry.     Coloration: Skin is not jaundiced or pale.  Neurological:     General: No focal deficit present.     Mental Status: She is alert and oriented to person, place, and time. Mental status is at baseline.  Psychiatric:        Mood and Affect: Mood normal.        Behavior: Behavior normal.        Thought Content: Thought content normal.        Judgment: Judgment normal.     Assessment:  [name] is a 87 y.o. female  who presents today for Esophagogastroduodenoscopy and Colonoscopy for iron deficiency anemia, dysphagia, reflux, weight loss.  Plan:  Esophagogastroduodenoscopy and Colonoscopy with possible intervention today  Esophagogastroduodenoscopy and colonoscopy with possible biopsy, control of bleeding, polypectomy, and interventions as necessary has been discussed with the patient/patient representative. Informed consent was obtained from the patient/patient representative after explaining the indication, nature, and risks of the procedure including but not limited to death, bleeding, perforation, missed neoplasm/lesions, cardiorespiratory compromise, and reaction to medications. Opportunity for questions was given and appropriate answers were provided. Patient/patient representative has verbalized understanding is amenable to undergoing the procedure.   Mackenzie Collins, DO  [facility]  Portions of  the record may have been created with voice recognition software. Occasional wrong-word or 'sound-a-like' substitutions may have occurred due to the inherent limitations of voice recognition software.  Read the chart carefully and recognize, using context, where substitutions may have occurred.

## 2021-04-11 ENCOUNTER — Encounter: Payer: Self-pay | Admitting: Gastroenterology

## 2021-04-14 ENCOUNTER — Other Ambulatory Visit: Payer: Self-pay | Admitting: Internal Medicine

## 2021-04-14 DIAGNOSIS — R1310 Dysphagia, unspecified: Secondary | ICD-10-CM

## 2021-04-14 DIAGNOSIS — K219 Gastro-esophageal reflux disease without esophagitis: Secondary | ICD-10-CM

## 2021-04-14 LAB — SURGICAL PATHOLOGY

## 2021-04-25 ENCOUNTER — Ambulatory Visit
Admission: RE | Admit: 2021-04-25 | Discharge: 2021-04-25 | Disposition: A | Discharge status: Home / Self Care | Payer: Medicare Other | Source: Ambulatory Visit | Attending: Internal Medicine | Admitting: Internal Medicine

## 2021-04-25 ENCOUNTER — Other Ambulatory Visit: Payer: Self-pay

## 2021-04-25 DIAGNOSIS — K219 Gastro-esophageal reflux disease without esophagitis: Secondary | ICD-10-CM

## 2021-04-25 DIAGNOSIS — R1310 Dysphagia, unspecified: Secondary | ICD-10-CM | POA: Insufficient documentation

## 2021-04-26 NOTE — Progress Notes (Signed)
Modified Barium Swallow Progress Note  Patient Details  Name: Mackenzie Walsh MRN: [id number] Date of Birth: 23-Sep-1931  Today's Date: 04/26/2021  Modified Barium Swallow completed.  Full report located under Chart Review in the Imaging Section.  Brief recommendations include the following:  Clinical Impression  Pt presents with adequate oropharyngeal abilities when consuming thin liquids via cup and straw, nectar thick liquids via cup, puree, and graham cracker. during the study, she reported sensation of nasal regurgitation but barium was not visualized in nasopharynx. Throughout the study, pt experienced globus sensation but pt's pharyngeal strength was good with no residue left in the pharynx. Pt did experience difficulty consuming the barium tablet whole. She required multiple boluses of applesauce to clear tablet from vallecula. At baseline, pt breaks her pills in half and doesn't consume any medicine as large as the barium tablet. Video feedback was provided to help pt comprehend that globus sensation was not within her pharynx. Reflux and aspiration precautions were reviewed with pt.   Swallow Evaluation Recommendations       SLP Diet Recommendations: Regular solids;Thin liquid   Liquid Administration via: Cup;Straw   Medication Administration: Whole meds with liquid   Supervision: Patient able to self feed   Compensations: Minimize environmental distractions;Slow rate;Small sips/bites   Postural Changes: Seated upright at 90 degrees   Oral Care Recommendations: Oral care BID      Alexio Sroka B. Dreama Saa M.S., CCC-SLP, [facility] Speech-Language Pathologist Rehabilitation Services Office [phone]   Kynslei Art 04/26/2021,1:54 PM

## 2021-04-27 DIAGNOSIS — Z95810 Presence of automatic (implantable) cardiac defibrillator: Secondary | ICD-10-CM

## 2021-05-07 ENCOUNTER — Other Ambulatory Visit (INDEPENDENT_AMBULATORY_CARE_PROVIDER_SITE_OTHER): Payer: Medicare Other | Admitting: Cardiovascular Disease

## 2021-05-07 ENCOUNTER — Telehealth: Payer: Self-pay | Admitting: Oncology

## 2021-05-07 LAB — REMOTE CARDIAC DEVICE MONITORING
AF Burden Percentage: 11
ICD Shock Recent Count: 0
RV Pacing Percentage: 95

## 2021-05-07 NOTE — Telephone Encounter (Signed)
Caregiver called in to cancel appt for 11-18. Please call to reschedule at [phone]

## 2021-05-08 ENCOUNTER — Ambulatory Visit
Admission: RE | Admit: 2021-05-08 | Discharge: 2021-05-08 | Disposition: A | Discharge status: Home / Self Care | Payer: Medicare Other | Source: Ambulatory Visit | Attending: Oncology | Admitting: Oncology

## 2021-05-08 ENCOUNTER — Other Ambulatory Visit: Payer: Self-pay

## 2021-05-08 DIAGNOSIS — R1900 Intra-abdominal and pelvic swelling, mass and lump, unspecified site: Secondary | ICD-10-CM | POA: Insufficient documentation

## 2021-05-09 ENCOUNTER — Inpatient Hospital Stay: Payer: Medicare Other

## 2021-05-09 ENCOUNTER — Inpatient Hospital Stay: Payer: Medicare Other | Admitting: Nurse Practitioner

## 2021-05-12 ENCOUNTER — Other Ambulatory Visit: Payer: Medicare Other

## 2021-05-12 ENCOUNTER — Other Ambulatory Visit: Payer: Self-pay

## 2021-05-12 DIAGNOSIS — D509 Iron deficiency anemia, unspecified: Secondary | ICD-10-CM

## 2021-05-13 ENCOUNTER — Inpatient Hospital Stay: Payer: Medicare Other

## 2021-05-13 ENCOUNTER — Inpatient Hospital Stay (HOSPITAL_BASED_OUTPATIENT_CLINIC_OR_DEPARTMENT_OTHER): Payer: Medicare Other | Admitting: Oncology

## 2021-05-13 ENCOUNTER — Encounter: Payer: Self-pay | Admitting: Oncology

## 2021-05-13 ENCOUNTER — Other Ambulatory Visit: Payer: Self-pay

## 2021-05-13 ENCOUNTER — Inpatient Hospital Stay: Payer: Medicare Other | Attending: Oncology

## 2021-05-13 VITALS — BP 78/50 | HR 67 | Temp 97.6°F | Resp 18 | Wt 115.5 lb

## 2021-05-13 VITALS — BP 91/54 | HR 66 | Resp 18

## 2021-05-13 DIAGNOSIS — R634 Abnormal weight loss: Secondary | ICD-10-CM | POA: Diagnosis not present

## 2021-05-13 DIAGNOSIS — I959 Hypotension, unspecified: Secondary | ICD-10-CM | POA: Insufficient documentation

## 2021-05-13 DIAGNOSIS — R1900 Intra-abdominal and pelvic swelling, mass and lump, unspecified site: Secondary | ICD-10-CM | POA: Diagnosis not present

## 2021-05-13 DIAGNOSIS — D509 Iron deficiency anemia, unspecified: Secondary | ICD-10-CM | POA: Diagnosis not present

## 2021-05-13 LAB — IRON AND TIBC
Iron: 69 ug/dL (ref 28–170)
Saturation Ratios: 18 % (ref 10.4–31.8)
TIBC: 388 ug/dL (ref 250–450)
UIBC: 319 ug/dL

## 2021-05-13 LAB — CBC WITH DIFFERENTIAL/PLATELET
Abs Immature Granulocytes: 0.02 10*3/uL (ref 0.00–0.07)
Basophils Absolute: 0 10*3/uL (ref 0.0–0.1)
Basophils Relative: 0 %
Eosinophils Absolute: 0.1 10*3/uL (ref 0.0–0.5)
Eosinophils Relative: 2 %
HCT: 33.3 % — ABNORMAL LOW (ref 36.0–46.0)
Hemoglobin: 10.5 g/dL — ABNORMAL LOW (ref 12.0–15.0)
Immature Granulocytes: 0 %
Lymphocytes Relative: 17 %
Lymphs Abs: 0.8 10*3/uL (ref 0.7–4.0)
MCH: 35 pg — ABNORMAL HIGH (ref 26.0–34.0)
MCHC: 31.5 g/dL (ref 30.0–36.0)
MCV: 111 fL — ABNORMAL HIGH (ref 80.0–100.0)
Monocytes Absolute: 0.5 10*3/uL (ref 0.1–1.0)
Monocytes Relative: 10 %
Neutro Abs: 3.4 10*3/uL (ref 1.7–7.7)
Neutrophils Relative %: 71 %
Platelets: 133 10*3/uL — ABNORMAL LOW (ref 150–400)
RBC: 3 MIL/uL — ABNORMAL LOW (ref 3.87–5.11)
RDW: 14.3 % (ref 11.5–15.5)
WBC: 4.8 10*3/uL (ref 4.0–10.5)
nRBC: 0 % (ref 0.0–0.2)

## 2021-05-13 LAB — FERRITIN: Ferritin: 154 ng/mL (ref 11–307)

## 2021-05-13 MED ORDER — SODIUM CHLORIDE 0.9 % IV SOLN
Freq: Once | INTRAVENOUS | Status: AC
  Filled 2021-05-13: qty 250

## 2021-05-13 MED ORDER — IRON SUCROSE 20 MG/ML IV SOLN
200.0000 mg | Freq: Once | INTRAVENOUS | Status: AC
  Administered 2021-05-13: 200 mg via INTRAVENOUS
  Filled 2021-05-13: qty 10

## 2021-05-13 MED ORDER — SODIUM CHLORIDE 0.9 % IV SOLN: 200.0000 mg | Freq: Once | INTRAVENOUS | Status: DC

## 2021-05-13 NOTE — Patient Instructions (Signed)
CANCER CENTER [facility] REGIONAL MEDICAL ONCOLOGY  Discharge Instructions: Thank you for choosing [facility] to provide your oncology and hematology care.  If you have a lab appointment with the Cancer Center, please go directly to the Cancer Center and check in at the registration area.  Wear comfortable clothing and clothing appropriate for easy access to any Portacath or PICC line.   We strive to give you quality time with your provider. You may need to reschedule your appointment if you arrive late (15 or more minutes).  Arriving late affects you and other patients whose appointments are after yours.  Also, if you miss three or more appointments without notifying the office, you may be dismissed from the clinic at the provider's discretion.      For prescription refill requests, have your pharmacy contact our office and allow 72 hours for refills to be completed.    Today you received the following chemotherapy and/or immunotherapy agents VENOFER and hydration      To help prevent nausea and vomiting after your treatment, we encourage you to take your nausea medication as directed.  BELOW ARE SYMPTOMS THAT SHOULD BE REPORTED IMMEDIATELY: *FEVER GREATER THAN 100.4 F (38 C) OR HIGHER *CHILLS OR SWEATING *NAUSEA AND VOMITING THAT IS NOT CONTROLLED WITH YOUR NAUSEA MEDICATION *UNUSUAL SHORTNESS OF BREATH *UNUSUAL BRUISING OR BLEEDING *URINARY PROBLEMS (pain or burning when urinating, or frequent urination) *BOWEL PROBLEMS (unusual diarrhea, constipation, pain near the anus) TENDERNESS IN MOUTH AND THROAT WITH OR WITHOUT PRESENCE OF ULCERS (sore throat, sores in mouth, or a toothache) UNUSUAL RASH, SWELLING OR PAIN  UNUSUAL VAGINAL DISCHARGE OR ITCHING   Items with * indicate a potential emergency and should be followed up as soon as possible or go to the Emergency Department if any problems should occur.  Please show the CHEMOTHERAPY ALERT CARD or IMMUNOTHERAPY ALERT CARD  at check-in to the Emergency Department and triage nurse.  Should you have questions after your visit or need to cancel or reschedule your appointment, please contact CANCER CENTER [facility] REG[facility]AL MEDICAL ONCOLOGY  [phone] and follow the prompts.  Office hours are 8:00 a.m. to 4:30 p.m. Monday - Friday. Please note that voicemails left after 4:00 p.m. may not be returned until the following business day.  We are closed weekends and major holidays. You have access to a nurse at all times for urgent questions. Please call the main number to the clinic [phone] and follow the prompts.  For any non-urgent questions, you may also contact your provider using MyChart. We now offer e-Visits for anyone 55 and older to request care online for non-urgent symptoms. For details visit [email or web address].   Also download the MyChart app! Go to the app store, search "MyChart", open the app, select , and log in with your MyChart username and password.  Due to Covid, a mask is required upon entering the hospital/clinic. If you do not have a mask, one will be given to you upon arrival. For doctor visits, patients may have 1 support person aged 35 or older with them. For treatment visits, patients cannot have anyone with them due to current Covid guidelines and our immunocompromised population.   Iron Sucrose Injection What is this medication? IRON SUCROSE (EYE ern SOO krose) treats low levels of iron (iron deficiency anemia) in people with kidney disease. Iron is a mineral that plays an important role in making red blood cells, which carry oxygen from your lungs to the rest of your body. This  medicine may be used for other purposes; ask your health care provider or pharmacist if you have questions. COMMON BRAND NAME(S): Venofer What should I tell my care team before I take this medication? They need to know if you have any of these conditions: Anemia not caused by low iron levels Heart  disease High levels of iron in the blood Kidney disease Liver disease An unusual or allergic reaction to iron, other medications, foods, dyes, or preservatives Pregnant or trying to get pregnant Breast-feeding How should I use this medication? This medication is for infusion into a vein. It is given in a hospital or clinic setting. Talk to your care team about the use of this medication in children. While this medication may be prescribed for children as young as 2 years for selected conditions, precautions do apply. Overdosage: If you think you have taken too much of this medicine contact a poison control center or emergency room at once. NOTE: This medicine is only for you. Do not share this medicine with others. What if I miss a dose? It is important not to miss your dose. Call your care team if you are unable to keep an appointment. What may interact with this medication? Do not take this medication with any of the following: Deferoxamine Dimercaprol Other iron products This medication may also interact with the following: Chloramphenicol Deferasirox This list may not describe all possible interactions. Give your health care provider a list of all the medicines, herbs, non-prescription drugs, or dietary supplements you use. Also tell them if you smoke, drink alcohol, or use illegal drugs. Some items may interact with your medicine. What should I watch for while using this medication? Visit your care team regularly. Tell your care team if your symptoms do not start to get better or if they get worse. You may need blood work done while you are taking this medication. You may need to follow a special diet. Talk to your care team. Foods that contain iron include: whole grains/cereals, dried fruits, beans, or peas, leafy green vegetables, and organ meats (liver, kidney). What side effects may I notice from receiving this medication? Side effects that you should report to your care team as  soon as possible: Allergic reactions--skin rash, itching, hives, swelling of the face, lips, tongue, or throat Low blood pressure--dizziness, feeling faint or lightheaded, blurry vision Shortness of breath Side effects that usually do not require medical attention (report to your care team if they continue or are bothersome): Flushing Headache Joint pain Muscle pain Nausea Pain, redness, or irritation at injection site This list may not describe all possible side effects. Call your doctor for medical advice about side effects. You may report side effects to FDA at 1-800-FDA-1088. Where should I keep my medication? This medication is given in a hospital or clinic and will not be stored at home. NOTE: This sheet is a summary. It may not cover all possible information. If you have questions about this medicine, talk to your doctor, pharmacist, or health care provider.  2022 Elsevier/Gold Standard (2020-11-01 00:00:00)  Dehydration, Adult Dehydration is condition in which there is not enough water or other fluids in the body. This happens when a person loses more fluids than he or she takes in. Important body parts cannot work right without the right amount of fluids. Any loss of fluids from the body can cause dehydration. Dehydration can be mild, worse, or very bad. It should be treated right away to keep it from getting very  bad. What are the causes? This condition may be caused by: Conditions that cause loss of water or other fluids, such as: Watery poop (diarrhea). Vomiting. Sweating a lot. Peeing (urinating) a lot. Not drinking enough fluids, especially when you: Are ill. Are doing things that take a lot of energy to do. Other illnesses and conditions, such as fever or infection. Certain medicines, such as medicines that take extra fluid out of the body (diuretics). Lack of safe drinking water. Not being able to get enough water and food. What increases the risk? The following  factors may make you more likely to develop this condition: Having a long-term (chronic) illness that has not been treated the right way, such as: Diabetes. Heart disease. Kidney disease. Being 47 years of age or older. Having a disability. Living in a place that is high above the ground or sea (high in altitude). The thinner, dried air causes more fluid loss. Doing exercises that put stress on your body for a long time. What are the signs or symptoms? Symptoms of dehydration depend on how bad it is. Mild or worse dehydration Thirst. Dry lips or dry mouth. Feeling dizzy or light-headed, especially when you stand up from sitting. Muscle cramps. Your body making: Dark pee (urine). Pee may be the color of tea. Less pee than normal. Less tears than normal. Headache. Very bad dehydration Changes in skin. Skin may: Be cold to the touch (clammy). Be blotchy or pale. Not go back to normal right after you lightly pinch it and let it go. Little or no tears, pee, or sweat. Changes in vital signs, such as: Fast breathing. Low blood pressure. Weak pulse. Pulse that is more than 100 beats a minute when you are sitting still. Other changes, such as: Feeling very thirsty. Eyes that look hollow (sunken). Cold hands and feet. Being mixed up (confused). Being very tired (lethargic) or having trouble waking from sleep. Short-term weight loss. Loss of consciousness. How is this treated? Treatment for this condition depends on how bad it is. Treatment should start right away. Do not wait until your condition gets very bad. Very bad dehydration is an emergency. You will need to go to a hospital. Mild or worse dehydration can be treated at home. You may be asked to: Drink more fluids. Drink an oral rehydration solution (ORS). This drink helps get the right amounts of fluids and salts and minerals in the blood (electrolytes). Very bad dehydration can be treated: With fluids through an IV  tube. By getting normal levels of salts and minerals in your blood. This is often done by giving salts and minerals through a tube. The tube is passed through your nose and into your stomach. By treating the root cause. Follow these instructions at home: Oral rehydration solution If told by your doctor, drink an ORS: Make an ORS. Use instructions on the package. Start by drinking small amounts, about  cup (120 mL) every 5-10 minutes. Slowly drink more until you have had the amount that your doctor said to have. Eating and drinking     Drink enough clear fluid to keep your pee pale yellow. If you were told to drink an ORS, finish the ORS first. Then, start slowly drinking other clear fluids. Drink fluids such as: Water. Do not drink only water. Doing that can make the salt (sodium) level in your body get too low. Water from ice chips you suck on. Fruit juice that you have added water to (diluted). Low-calorie sports drinks.  Eat foods that have the right amounts of salts and minerals, such as: Bananas. Oranges. Potatoes. Tomatoes. Spinach. Do not drink alcohol. Avoid: Drinks that have a lot of sugar. These include: High-calorie sports drinks. Fruit juice that you did not add water to. Soda. Caffeine. Foods that are greasy or have a lot of fat or sugar. General instructions Take over-the-counter and prescription medicines only as told by your doctor. Do not take salt tablets. Doing that can make the salt level in your body get too high. Return to your normal activities as told by your doctor. Ask your doctor what activities are safe for you. Keep all follow-up visits as told by your doctor. This is important. Contact a doctor if: You have pain in your belly (abdomen) and the pain: Gets worse. Stays in one place. You have a rash. You have a stiff neck. You get angry or annoyed (irritable) more easily than normal. You are more tired or have a harder time waking than  normal. You feel: Weak or dizzy. Very thirsty. Get help right away if you have: Any symptoms of very bad dehydration. Symptoms of vomiting, such as: You cannot eat or drink without vomiting. Your vomiting gets worse or does not go away. Your vomit has blood or green stuff in it. Symptoms that get worse with treatment. A fever. A very bad headache. Problems with peeing or pooping (having a bowel movement), such as: Watery poop that gets worse or does not go away. Blood in your poop (stool). This may cause poop to look black and tarry. Not peeing in 6-8 hours. Peeing only a small amount of very dark pee in 6-8 hours. Trouble breathing. These symptoms may be an emergency. Do not wait to see if the symptoms will go away. Get medical help right away. Call your local emergency services (911 in the [place] Do not drive yourself to the hospital. Summary Dehydration is a condition in which there is not enough water or other fluids in the body. This happens when a person loses more fluids than he or she takes in. Treatment for this condition depends on how bad it is. Treatment should be started right away. Do not wait until your condition gets very bad. Drink enough clear fluid to keep your pee pale yellow. If you were told to drink an oral rehydration solution (ORS), finish the ORS first. Then, start slowly drinking other clear fluids. Take over-the-counter and prescription medicines only as told by your doctor. Get help right away if you have any symptoms of very bad dehydration. This information is not intended to replace advice given to you by your health care provider. Make sure you discuss any questions you have with your health care provider. Document Revised: 01/19/2019 Document Reviewed: 01/19/2019 Elsevier Patient Education  [place].

## 2021-05-13 NOTE — Progress Notes (Signed)
Hematology/Oncology Consult note Mackenzie Walsh [phone] [phone]   Patient Care Team: Leonel Ramsay, MD as PCP - General (Infectious Diseases)  REFERRING PROVIDER: Leonel Ramsay, MD  CHIEF COMPLAINTS/REASON FOR VISIT:  Anemia, weight loss, pelvic mass  HISTORY OF PRESENTING ILLNESS:  patient has PACEMAKER   Mackenzie Walsh is a  87 y.o.  female with PMH listed below was seen in consultation at the request of  Leonel Ramsay, MD  for evaluation of abnormal SPEP Patient was recently seen by Dr. Lacinda Axon neurosurgery for thoracic back pain with radiculopathy.  Patient has scoliosis, 3 weeks ago she started to feel severe upper back pain. X-ray of thoracic spine on 10/17/2019 showed multiple thoracic compression fractures.   11/07/2019 CT thoracic spine without contrast showed old and healed compression deformities at T1, T8 and T9.  Potentially more recent compression fracture at T3 and T7. 10/17/2019, patient had blood work-up including SPEP which showed  M spike of 3.7. CBC showed hemoglobin 11.5, MCV 107, platelet count 141.  No recent CMP Patient has CKD with creatinine baseline around 1.5.  Patient has multiple comorbidities including dilated cardiomyopathy, ventricular tachycardia, implantable cardioverter, chronic systolic CHF, CKD stage III, compression fracture, scoliosis deformity of the spine, mitral valve regurgitation, celiac disease, hypothyroidism, hypercholesterolemia, atrial fibrillation on anticoagulation, osteoporosis. She lives with her husband who has Alzheimer's disease.  Her children live in [place].  Her son lives about 30 minutes away from her home.  She gets help for grocery shopping.  She still drives herself and her husband to medical appointments.  INTERVAL HISTORY Patient presents with above history for follow-up for macrocytic anemia. She reports having a stressful past three months. Her husband  recently passed away after rapid decline with dementia. She is here with her caregiver today. States she has probably not been eating or drinking well d/t stress. Reports overall feeling tired and not sleeping well. She is in a wheelchair today.   Review of Systems  Constitutional:  Positive for fatigue. Negative for appetite change, fever and unexpected weight change.  HENT:   Negative for nosebleeds, sore throat and trouble swallowing.   Eyes: Negative.   Respiratory: Negative.  Negative for cough, shortness of breath and wheezing.   Cardiovascular: Negative.  Negative for chest pain and leg swelling.  Gastrointestinal:  Negative for abdominal pain, blood in stool, constipation, diarrhea, nausea and vomiting.  Endocrine: Negative.   Genitourinary: Negative.  Negative for bladder incontinence, hematuria and nocturia.   Musculoskeletal: Negative.  Negative for back pain and flank pain.  Skin: Negative.   Neurological: Negative.  Negative for dizziness, headaches, light-headedness and numbness.  Hematological: Negative.   Psychiatric/Behavioral:  Negative for confusion. The patient is nervous/anxious.    MEDICAL HISTORY:  Past Medical History:  Diagnosis Date   A-fib [facility])    AICD (automatic cardioverter/defibrillator) present    Allergy    Anemia    Arrhythmia    Atrial fibrillation (HCC)    Broken femur ([place]) 2017   Cataract    Celiac disease    Chronic pain syndrome    Chronic systolic CHF (congestive heart failure) (HCC)    CKD (chronic kidney disease)    COPD (chronic obstructive pulmonary disease) ([place])    Dilated cardiomyopathy (HCC)    Dysrhythmia    Hyperlipidemia    Hypertension    Iron deficiency anemia 12/09/2020   Migraines    Mitral valve regurgitation    Osteoporosis  Posterior rhinorrhea    Presence of permanent cardiac pacemaker    Scoliosis deformity of spine    Senile hyperkeratosis    Thyroid disease    hypothyroid   Ventricular tachyarrhythmia      SURGICAL HISTORY: Past Surgical History:  Procedure Laterality Date   ablation arrythmia focus     [place]  2016   [place] EXTERNAL X4     COLONOSCOPY N/A 04/10/2021   Procedure: COLONOSCOPY;  Surgeon: Annamaria Helling, DO;  Location: [facility] ENDOSCOPY;  Service: Gastroenterology;  Laterality: N/A;   CORONARY ARTERY BYPASS GRAFT     ESOPHAGOGASTRODUODENOSCOPY (EGD) WITH PROPOFOL N/A 04/10/2021   Procedure: ESOPHAGOGASTRODUODENOSCOPY (EGD) WITH PROPOFOL;  Surgeon: Annamaria Helling, DO;  Location: [facility] ENDOSCOPY;  Service: Gastroenterology;  Laterality: N/A;   PACEMAKER IMPLANT     REPLACEMENT TOTAL KNEE RIGHT  2016   TONSILLECTOMY  1945    SOCIAL HISTORY: Social History   Socioeconomic History   Marital status: Married    Spouse name: Not on file   Number of children: Not on file   Years of education: Not on file   Highest education level: Not on file  Occupational History   Not on file  Tobacco Use   Smoking status: Never   Smokeless tobacco: Never  Vaping Use   Vaping Use: Never used  Substance and Sexual Activity   Alcohol use: Not Currently   Drug use: Never   Sexual activity: Not on file  Other Topics Concern   Not on file  Social History Narrative   Not on file   Social Determinants of Health   Financial Resource Strain: Not on file  Food Insecurity: Not on file  Transportation Needs: Not on file  Physical Activity: Not on file  Stress: Not on file  Social Connections: Not on file  Intimate Partner Violence: Not on file    FAMILY HISTORY: Family History  Problem Relation Age of Onset   Pneumonia Mother    Osteoporosis Mother    Stroke Father    Heart failure Father    Hypertension Father    Obesity Father    Heart failure Sister     ALLERGIES:  is allergic to erythromycin base, gluten meal, and dofetilide.  MEDICATIONS:  Current Outpatient Medications   Medication Sig Dispense Refill   Acetaminophen 500 MG capsule Take 2 capsules (1,000 mg total) by mouth 3 times/day as needed-between meals & bedtime. 30 capsule    amiodarone (PACERONE) 200 MG tablet Take 200 mg by mouth daily.      bifidobacterium infantis (ALIGN) capsule Take 1 capsule by mouth daily.     CALCIUM CITRATE PO Take 600 mg by mouth 2 (two) times daily as needed.     carvedilol (COREG) 12.5 MG tablet Take 12.5 mg by mouth 2 (two) times daily with a meal.      CITRACAL MAXIMUM 315-250 MG-UNIT TABS      Coenzyme Q10 (COQ-10) 100 MG CAPS Take 100 mg by mouth daily.      fluticasone (FLONASE) 50 MCG/ACT nasal spray Place 1-2 sprays into both nostrils daily.      furosemide (LASIX) 40 MG tablet Take 20 mg by mouth daily.     gabapentin (NEURONTIN) 300 MG capsule Take 300 mg by mouth 3 (three) times daily.      levothyroxine (SYNTHROID) 112 MCG tablet Take 112 mcg by mouth daily before breakfast.  losartan (COZAAR) 25 MG tablet Take 25 mg by mouth daily.      Magnesium Oxide 200 MG TABS Take 200 mg by mouth daily.      Multiple Vitamin (MULTI-VITAMIN DAILY PO) Take 1 tablet by mouth daily.      Rivaroxaban (XARELTO) 15 MG TABS tablet Take 15 mg by mouth daily with supper.      simvastatin (ZOCOR) 10 MG tablet Take 10 mg by mouth at bedtime.     spironolactone (ALDACTONE) 25 MG tablet Take 12.5 mg by mouth daily.      albuterol (PROVENTIL) (2.5 MG/3ML) 0.083% nebulizer solution Take 3 mLs (2.5 mg total) by nebulization every 4 (four) hours as needed for wheezing or shortness of breath. (Patient not taking: Reported on 05/13/2021) 75 mL 2   omeprazole (PRILOSEC) 40 MG capsule Take 40 mg by mouth daily. (Patient not taking: Reported on 05/13/2021)     No current facility-administered medications for this visit.     PHYSICAL EXAMINATION: ECOG PERFORMANCE STATUS: 1 - Symptomatic but completely ambulatory Vitals:   05/13/21 1055  BP: (!) 78/50  Pulse: 67  Resp: 18  Temp: 97.6  F (36.4 C)   Filed Weights   05/13/21 1055  Weight: 115 lb 8 oz (52.4 kg)    Physical Exam Constitutional:      Appearance: Normal appearance.  HENT:     Head: Normocephalic and atraumatic.  Eyes:     Pupils: Pupils are equal, round, and reactive to light.  Cardiovascular:     Rate and Rhythm: Normal rate and regular rhythm.     Heart sounds: Normal heart sounds. No murmur heard. Pulmonary:     Effort: Pulmonary effort is normal.     Breath sounds: Normal breath sounds. No wheezing.  Abdominal:     General: Bowel sounds are normal. There is no distension.     Palpations: Abdomen is soft.     Tenderness: There is no abdominal tenderness.  Musculoskeletal:        General: Normal range of motion.     Cervical back: Normal range of motion.  Skin:    General: Skin is warm and dry.     Findings: No rash.  Neurological:     Mental Status: She is alert and oriented to person, place, and time.  Psychiatric:        Judgment: Judgment normal.    LABORATORY DATA:  I have reviewed the data as listed Lab Results  Component Value Date   WBC 4.8 05/13/2021   HGB 10.5 (L) 05/13/2021   HCT 33.3 (L) 05/13/2021   MCV 111.0 (H) 05/13/2021   PLT 133 (L) 05/13/2021   Recent Labs    05/28/20 0925 12/09/20 0921  NA 139 137  K 4.0 4.5  CL 99 99  CO2 29 29  GLUCOSE 97 131*  BUN 27* 26*  CREATININE 1.36* 1.23*  CALCIUM 9.3 9.1  GFRNONAA 38* 43*  PROT 7.0 6.8  ALBUMIN 4.2 3.9  AST 33 34  ALT 18 19  ALKPHOS 57 57  BILITOT 1.0 0.9    Iron/TIBC/Ferritin/ %Sat    Component Value Date/Time   IRON 77 02/05/2021 0955   TIBC 367 02/05/2021 0955   FERRITIN 255 02/05/2021 0955   IRONPCTSAT 21 02/05/2021 0955      RADIOGRAPHIC STUDIES: I have personally reviewed the radiological images as listed and agreed with the findings in the report. CT Abdomen Pelvis Wo Contrast  Result Date: 05/09/2021 CLINICAL DATA:  Pelvic  mass. Chronic shortness of breath. Loss of weight. EXAM:  CT ABDOMEN AND PELVIS WITHOUT CONTRAST TECHNIQUE: Multidetector CT imaging of the abdomen and pelvis was performed following the standard protocol without IV contrast. COMPARISON:  NM pet 02/04/2021; CT abdomen and pelvis 12/31/2020; CT chest 06/11/2020. FINDINGS: Lower chest: Heart size is enlarged. An implantable cardioverter-defibrillator is in place with leads in the right atrial appendage, coronary sinus and right ventricle. Coronary artery calcifications and aortic atherosclerosis noted. Extensive pleuroparenchymal scarring noted within both lower lobes, right middle lobe and lingula. Hepatobiliary: No focal liver abnormality is seen. No gallstones, gallbladder wall thickening, or biliary dilatation. Pancreas: The pancreas appears indistinct. As noted previously, there is very little, if any, fat adjacent to the pancreas to provide inherent contrast. No signs of gross inflammation. Spleen: Normal in size without focal abnormality. Adrenals/Urinary Tract: No adrenal mass identified. No signs of nephrolithiasis, kidney mass or hydronephrosis. Urinary bladder appears normal for degree of distension. Stomach/Bowel: Stomach appears within normal limits. No bowel wall thickening, inflammation or distension. The appendix is not confidently identified. No signs of pericecal inflammation, however. Vascular/Lymphatic: Aortic atherosclerosis without aneurysm. No abdominal adenopathy identified. At the bifurcation of the right common iliac artery, there is a mass in the right hemipelvis inseparable from the adjacent vasculature. This measures 4.8 x 3.2 cm, image 51/2. Formally, this measured 5.0 x 3.7 cm (when remeasured). Reproductive: Uterus and bilateral adnexa are unremarkable. Other: No free fluid or fluid collections. Musculoskeletal: Bones are diffusely osteopenic. Previous open reduction internal fixation of the left femur. Scoliosis and degenerative disc disease noted within the thoracolumbar spine. Severe facet  arthropathy noted involving the right lower lumbar facet joints. No acute or suspicious osseous findings. IMPRESSION: 1. No acute findings identified within the abdomen or pelvis. 2. Mass in the right hemipelvis is inseparable from the adjacent vasculature and is slightly decreased in size from 12/31/2020. Etiology remains indeterminate but relative stability suggests an indolent or possibly benign process. Recommend continued interval follow-up with repeat CT of the pelvis with IV and oral contrast in 3 months. 3. Aortic atherosclerosis (ICD10-I70.0). Electronically Signed   By: Kerby Moors M.D.   On: 05/09/2021 13:15   DG SWALLOW FUNC OP MEDICARE SPEECH PATH  Result Date: 04/26/2021 Table formatting from the original result was not included. Objective Swallowing Evaluation: Type of Study: MBS-Modified Barium Swallow Study  Patient Details Name: AREON KAMMER MRN: [id number] Date of Birth: 01/03/1932 Today's Date: 04/26/2021 Time: SLP Start Time (ACUTE ONLY): [id number] -SLP Stop Time (ACUTE ONLY): 1310 SLP Time Calculation (min) (ACUTE ONLY): 25 min Past Medical History: Past Medical History: Diagnosis Date  A-fib ([place])   AICD (automatic cardioverter/defibrillator) present   Allergy   Anemia   Arrhythmia   Atrial fibrillation ([place])   Broken femur ([place]) 2017  Cataract   Celiac disease   Chronic pain syndrome   Chronic systolic CHF (congestive heart failure) (HCC)   CKD (chronic kidney disease)   COPD (chronic obstructive pulmonary disease) ([place])   Dilated cardiomyopathy ([place])   Dysrhythmia   Hyperlipidemia   Hypertension   Iron deficiency anemia 12/09/2020  Migraines   Mitral valve regurgitation   Osteoporosis   Posterior rhinorrhea   Presence of permanent cardiac pacemaker   Scoliosis deformity of spine   Senile hyperkeratosis   Thyroid disease   hypothyroid  Ventricular tachyarrhythmia  Past Surgical History: Past Surgical History: Procedure Laterality Date  ablation arrythmia focus    [place]  2016  CARDIAC SURGERY    CARDIOVERSION EXTERNAL X4    COLONOSCOPY N/A 04/10/2021  Procedure: COLONOSCOPY;  Surgeon: Jaynie Collins, DO;  Location: [facility] ENDOSCOPY;  Service: Gastroenterology;  Laterality: N/A;  CORONARY ARTERY BYPASS GRAFT    ESOPHAGOGASTRODUODENOSCOPY (EGD) WITH PROPOFOL N/A 04/10/2021  Procedure: ESOPHAGOGASTRODUODENOSCOPY (EGD) WITH PROPOFOL;  Surgeon: Jaynie Collins, DO;  Location: [facility] ENDOSCOPY;  Service: Gastroenterology;  Laterality: N/A;  PACEMAKER IMPLANT    REPLACEMENT TOTAL KNEE RIGHT  2016  TONSILLECTOMY  1945 HPI: Pt is an 87 year old female who was referred by Dr Einar Crow for an instrumental swallow study d/t globus sensation that is present throughout pt's enitre day. Of note pt had an EGD  on 04/10/2021 that noted abnormal esophageal motility. No recent respiratory decline found in pt's chart.  Subjective: pt pleasant, good historian Assessment / Plan / Recommendation CHL IP CLINICAL IMPRESSIONS 04/26/2021 Clinical Impression Pt presents with adequate oropharyngeal abilities when consuming thin liquids via cup and straw, nectar thick liquids via cup, puree, and graham cracker. during the study, she reported sensation of nasal regurgitation but barium was not visualized in nasopharynx. Throughout the study, pt experienced globus sensation but pt's pharyngeal strength was good with no residue left in the pharynx. Pt did experience difficulty consuming the barium tablet whole. She required multiple boluses of applesauce to clear tablet from vallecula. At baseline, pt breaks her pills in half and doesn't consume any medicine as large as the barium tablet. Video feedback was provided to help pt comprehend that globus sensation was not within her pharynx. Reflux and aspiration precautions were reviewed with pt. SLP Visit Diagnosis Dysphagia, unspecified (R13.10) Attention and concentration deficit following -- Frontal lobe and  executive function deficit following -- Impact on safety and function Mild aspiration risk   CHL IP TREATMENT RECOMMENDATION 04/26/2021 Treatment Recommendations No treatment recommended at this time   No flowsheet data found. CHL IP DIET RECOMMENDATION 04/26/2021 SLP Diet Recommendations Regular solids;Thin liquid Liquid Administration via Cup;Straw Medication Administration Whole meds with liquid Compensations Minimize environmental distractions;Slow rate;Small sips/bites Postural Changes Seated upright at 90 degrees   CHL IP OTHER RECOMMENDATIONS 04/26/2021 Recommended Consults -- Oral Care Recommendations Oral care BID Other Recommendations --   CHL IP FOLLOW UP RECOMMENDATIONS 04/26/2021 Follow up Recommendations None   No flowsheet data found.     CHL IP ORAL PHASE 04/26/2021 Oral Phase WFL Oral - Pudding Teaspoon -- Oral - Pudding Cup -- Oral - Honey Teaspoon -- Oral - Honey Cup -- Oral - Nectar Teaspoon -- Oral - Nectar Cup -- Oral - Nectar Straw -- Oral - Thin Teaspoon -- Oral - Thin Cup -- Oral - Thin Straw -- Oral - Puree -- Oral - Mech Soft -- Oral - Regular -- Oral - Multi-Consistency -- Oral - Pill -- Oral Phase - Comment --  CHL IP PHARYNGEAL PHASE 04/26/2021 Pharyngeal Phase WFL Pharyngeal- Pudding Teaspoon -- Pharyngeal -- Pharyngeal- Pudding Cup -- Pharyngeal -- Pharyngeal- Honey Teaspoon -- Pharyngeal -- Pharyngeal- Honey Cup -- Pharyngeal -- Pharyngeal- Nectar Teaspoon -- Pharyngeal -- Pharyngeal- Nectar Cup -- Pharyngeal -- Pharyngeal- Nectar Straw -- Pharyngeal -- Pharyngeal- Thin Teaspoon -- Pharyngeal -- Pharyngeal- Thin Cup -- Pharyngeal -- Pharyngeal- Thin Straw -- Pharyngeal -- Pharyngeal- Puree -- Pharyngeal -- Pharyngeal- Mechanical Soft -- Pharyngeal -- Pharyngeal- Regular -- Pharyngeal -- Pharyngeal- Multi-consistency -- Pharyngeal -- Pharyngeal- Pill -- Pharyngeal -- Pharyngeal Comment --  CHL IP CERVICAL ESOPHAGEAL PHASE 04/26/2021 Cervical Esophageal Phase WFL Pudding Teaspoon --  Pudding  Cup -- Honey Teaspoon -- Honey Cup -- Nectar Teaspoon -- Nectar Cup -- Nectar Straw -- Thin Teaspoon -- Thin Cup -- Thin Straw -- Puree -- Mechanical Soft -- Regular -- Multi-consistency -- Pill -- Cervical Esophageal Comment -- Happi B. Rutherford Nail M.S., CCC-SLP, [place] Office [phone] Stormy Fabian 04/26/2021, 1:57 PM            CLINICAL DATA:  Dysphagia. Esophagitis EXAM: MODIFIED BARIUM SWALLOW TECHNIQUE: Different consistencies of barium were administered orally to the patient by the Speech Pathologist. Imaging of the pharynx was performed in the lateral projection. The radiologist was present in the fluoroscopy room for this study, providing personal supervision. FLUOROSCOPY TIME:  Fluoroscopy Time:  2.1 minute Radiation Exposure Index (if provided by the fluoroscopic device): 12 mGy Number of Acquired Spot Images: 0 COMPARISON:  None. FINDINGS: Real-time fluoroscopy of the swallowing function was performed with a speech pathologist present. Multiple consistencies of barium were administered which included water, nectar, applesauce, and Graham cracker. Normal swallow function. No laryngeal penetration or tracheal aspiration. Small amounts of vallecular residual which clear with subsequent swallows. At the end of the examination a 13 mm barium tablet was administered which transited through the esophagus and esophagogastric junction without delay. IMPRESSION: Modified barium swallow as described above. Please refer to the Speech Pathologists report for complete details and recommendations. Electronically Signed   By: Kathreen Devoid M.D.   On: 04/25/2021 13:49      ASSESSMENT & PLAN:  No diagnosis found.  Iron deficiency anemia- Etiology likely GI. Recent colonoscopy and egd  showed non-bleeding internal hemorrhoids and one non-bleeding colonic angioectasia. She is s/p IV venofer last given from 12/13/20-01/03/21 with good tolerance. Labs from today show a  hemoglobin of 10.5 (12.5), MCV 111.0. Iron panel and ferritin are pending. Given drop in hemoglobin, recommend IV iron today. Will discuss additional iron infusions once labs result.   Hypotension- On several Bp medications. Her PCP recemtly decreased her losartan and lasix. I have asked her to hold her lasix until needed for lower extremity swelling. Will give 500 ml of Iv fluids with iron today. Follow-up with PCP.   Unintentional weight loss- Weight down 3 lbs since August. She has had work-up including Ct scan and Pet scan which showed possible mass along pelvic wall with suv 5.5. Repeat Ct scan from 05/09/21 shows no new acute findings with slightly decreased mass in right hemipelvis. Given stability, this is likely a benign process. Recommend Ct scan in 3 months for follow-up.  Right hip/femur fracture- Pet scan back in August showed fracture of previously ORIF left femur since previous imaging in July. Most recent imaging from 05/08/21 shows previous open reduction internal fixation.Patient declined follow-up with ortho.   Disposition- Iron today.  RTC in 3 months for follow-up with Dr. Tasia Catchings +/- IV venofer. Needs repeat Ct scan in 3 months.   Addendum-Iron labs are stable. No additional iron needed. Monitor.    I spent 25 minutes dedicated to the care of this patient (face-to-face and non-face-to-face) on the date of the encounter to include what is described in the assessment and plan.  No orders of the defined types were placed in this encounter.   All questions were answered. The patient knows to call the clinic with any problems questions or concerns.  cc Leonel Ramsay, MD   Faythe Casa, NP 05/13/2021 3:26 PM .

## 2021-05-14 ENCOUNTER — Ambulatory Visit: Payer: Medicare Other | Admitting: Oncology

## 2021-05-14 ENCOUNTER — Ambulatory Visit: Payer: Medicare Other

## 2021-05-26 ENCOUNTER — Emergency Department: Payer: Medicare Other

## 2021-05-26 ENCOUNTER — Other Ambulatory Visit: Payer: Self-pay

## 2021-05-26 ENCOUNTER — Inpatient Hospital Stay
Admission: EM | Admit: 2021-05-26 | Discharge: 2021-05-29 | DRG: 535 | Disposition: A | Discharge status: Skilled Nursing Facility | Payer: Medicare Other | Attending: Student | Admitting: Student

## 2021-05-26 DIAGNOSIS — E440 Moderate protein-calorie malnutrition: Secondary | ICD-10-CM | POA: Diagnosis present

## 2021-05-26 DIAGNOSIS — I5032 Chronic diastolic (congestive) heart failure: Secondary | ICD-10-CM | POA: Diagnosis not present

## 2021-05-26 DIAGNOSIS — Z66 Do not resuscitate: Secondary | ICD-10-CM | POA: Diagnosis present

## 2021-05-26 DIAGNOSIS — I10 Essential (primary) hypertension: Secondary | ICD-10-CM

## 2021-05-26 DIAGNOSIS — Z8262 Family history of osteoporosis: Secondary | ICD-10-CM

## 2021-05-26 DIAGNOSIS — Z888 Allergy status to other drugs, medicaments and biological substances status: Secondary | ICD-10-CM

## 2021-05-26 DIAGNOSIS — I130 Hypertensive heart and chronic kidney disease with heart failure and stage 1 through stage 4 chronic kidney disease, or unspecified chronic kidney disease: Secondary | ICD-10-CM | POA: Diagnosis present

## 2021-05-26 DIAGNOSIS — Z881 Allergy status to other antibiotic agents status: Secondary | ICD-10-CM

## 2021-05-26 DIAGNOSIS — Z6820 Body mass index (BMI) 20.0-20.9, adult: Secondary | ICD-10-CM

## 2021-05-26 DIAGNOSIS — Z96651 Presence of right artificial knee joint: Secondary | ICD-10-CM | POA: Diagnosis present

## 2021-05-26 DIAGNOSIS — Z951 Presence of aortocoronary bypass graft: Secondary | ICD-10-CM

## 2021-05-26 DIAGNOSIS — S32591A Other specified fracture of right pubis, initial encounter for closed fracture: Secondary | ICD-10-CM | POA: Diagnosis not present

## 2021-05-26 DIAGNOSIS — K219 Gastro-esophageal reflux disease without esophagitis: Secondary | ICD-10-CM | POA: Diagnosis present

## 2021-05-26 DIAGNOSIS — W19XXXA Unspecified fall, initial encounter: Secondary | ICD-10-CM

## 2021-05-26 DIAGNOSIS — N1831 Chronic kidney disease, stage 3a: Secondary | ICD-10-CM | POA: Diagnosis present

## 2021-05-26 DIAGNOSIS — M810 Age-related osteoporosis without current pathological fracture: Secondary | ICD-10-CM | POA: Diagnosis present

## 2021-05-26 DIAGNOSIS — E785 Hyperlipidemia, unspecified: Secondary | ICD-10-CM | POA: Diagnosis present

## 2021-05-26 DIAGNOSIS — S32599A Other specified fracture of unspecified pubis, initial encounter for closed fracture: Secondary | ICD-10-CM | POA: Diagnosis present

## 2021-05-26 DIAGNOSIS — D539 Nutritional anemia, unspecified: Secondary | ICD-10-CM | POA: Diagnosis present

## 2021-05-26 DIAGNOSIS — I4891 Unspecified atrial fibrillation: Secondary | ICD-10-CM | POA: Diagnosis present

## 2021-05-26 DIAGNOSIS — N183 Chronic kidney disease, stage 3 unspecified: Secondary | ICD-10-CM | POA: Diagnosis present

## 2021-05-26 DIAGNOSIS — J449 Chronic obstructive pulmonary disease, unspecified: Secondary | ICD-10-CM | POA: Diagnosis present

## 2021-05-26 DIAGNOSIS — Z8249 Family history of ischemic heart disease and other diseases of the circulatory system: Secondary | ICD-10-CM

## 2021-05-26 DIAGNOSIS — I340 Nonrheumatic mitral (valve) insufficiency: Secondary | ICD-10-CM | POA: Diagnosis present

## 2021-05-26 DIAGNOSIS — G8929 Other chronic pain: Secondary | ICD-10-CM | POA: Diagnosis present

## 2021-05-26 DIAGNOSIS — Z91018 Allergy to other foods: Secondary | ICD-10-CM

## 2021-05-26 DIAGNOSIS — Z95810 Presence of automatic (implantable) cardiac defibrillator: Secondary | ICD-10-CM

## 2021-05-26 DIAGNOSIS — Z7901 Long term (current) use of anticoagulants: Secondary | ICD-10-CM

## 2021-05-26 DIAGNOSIS — Z79890 Hormone replacement therapy (postmenopausal): Secondary | ICD-10-CM

## 2021-05-26 DIAGNOSIS — Z79899 Other long term (current) drug therapy: Secondary | ICD-10-CM

## 2021-05-26 DIAGNOSIS — E039 Hypothyroidism, unspecified: Secondary | ICD-10-CM | POA: Diagnosis present

## 2021-05-26 DIAGNOSIS — U071 COVID-19: Secondary | ICD-10-CM | POA: Diagnosis present

## 2021-05-26 LAB — COMPREHENSIVE METABOLIC PANEL
ALT: 91 U/L — ABNORMAL HIGH (ref 0–44)
AST: 41 U/L (ref 15–41)
Albumin: 3.1 g/dL — ABNORMAL LOW (ref 3.5–5.0)
Alkaline Phosphatase: 72 U/L (ref 38–126)
Anion gap: 5 (ref 5–15)
BUN: 35 mg/dL — ABNORMAL HIGH (ref 8–23)
CO2: 27 mmol/L (ref 22–32)
Calcium: 8.5 mg/dL — ABNORMAL LOW (ref 8.9–10.3)
Chloride: 103 mmol/L (ref 98–111)
Creatinine, Ser: 1.13 mg/dL — ABNORMAL HIGH (ref 0.44–1.00)
GFR, Estimated: 47 mL/min — ABNORMAL LOW (ref >60)
Glucose, Bld: 93 mg/dL (ref 70–99)
Potassium: 4.1 mmol/L (ref 3.5–5.1)
Sodium: 135 mmol/L (ref 135–145)
Total Bilirubin: 1.4 mg/dL — ABNORMAL HIGH (ref 0.3–1.2)
Total Protein: 5.7 g/dL — ABNORMAL LOW (ref 6.5–8.1)

## 2021-05-26 LAB — CBC
HCT: 33 % — ABNORMAL LOW (ref 36.0–46.0)
Hemoglobin: 10.5 g/dL — ABNORMAL LOW (ref 12.0–15.0)
MCH: 35 pg — ABNORMAL HIGH (ref 26.0–34.0)
MCHC: 31.8 g/dL (ref 30.0–36.0)
MCV: 110 fL — ABNORMAL HIGH (ref 80.0–100.0)
Platelets: 163 10*3/uL (ref 150–400)
RBC: 3 MIL/uL — ABNORMAL LOW (ref 3.87–5.11)
RDW: 13.8 % (ref 11.5–15.5)
WBC: 8 10*3/uL (ref 4.0–10.5)
nRBC: 0 % (ref 0.0–0.2)

## 2021-05-26 LAB — RESP PANEL BY RT-PCR (FLU A&B, COVID) ARPGX2
Influenza A by PCR: NEGATIVE
Influenza B by PCR: NEGATIVE
SARS Coronavirus 2 by RT PCR: POSITIVE — AB

## 2021-05-26 LAB — TROPONIN I (HIGH SENSITIVITY): Troponin I (High Sensitivity): 15 ng/L (ref <18)

## 2021-05-26 LAB — BRAIN NATRIURETIC PEPTIDE: B Natriuretic Peptide: 519.2 pg/mL — ABNORMAL HIGH (ref 0.0–100.0)

## 2021-05-26 MED ORDER — ALBUTEROL SULFATE HFA 108 (90 BASE) MCG/ACT IN AERS
2.0000 | INHALATION_SPRAY | RESPIRATORY_TRACT | Status: DC | PRN
  Filled 2021-05-26: qty 6.7

## 2021-05-26 MED ORDER — METHOCARBAMOL 500 MG PO TABS
500.0000 mg | ORAL_TABLET | Freq: Three times a day (TID) | ORAL | Status: DC | PRN
  Administered 2021-05-27 (×2): 500 mg via ORAL
  Filled 2021-05-26 (×3): qty 1

## 2021-05-26 MED ORDER — OYSTER SHELL CALCIUM/D3 500-5 MG-MCG PO TABS
1.0000 | ORAL_TABLET | Freq: Every day | ORAL | Status: DC
  Administered 2021-05-27 – 2021-05-29 (×3): 1 via ORAL
  Filled 2021-05-26 (×3): qty 1

## 2021-05-26 MED ORDER — DM-GUAIFENESIN ER 30-600 MG PO TB12
1.0000 | ORAL_TABLET | Freq: Two times a day (BID) | ORAL | Status: DC | PRN
  Filled 2021-05-26: qty 1

## 2021-05-26 MED ORDER — ACETAMINOPHEN 325 MG PO TABS
650.0000 mg | ORAL_TABLET | Freq: Four times a day (QID) | ORAL | Status: DC | PRN
  Administered 2021-05-27: 650 mg via ORAL
  Filled 2021-05-26: qty 2

## 2021-05-26 MED ORDER — LEVOTHYROXINE SODIUM 112 MCG PO TABS
112.0000 ug | ORAL_TABLET | Freq: Every day | ORAL | Status: DC
  Administered 2021-05-27 – 2021-05-29 (×3): 112 ug via ORAL
  Filled 2021-05-26 (×4): qty 1

## 2021-05-26 MED ORDER — PANTOPRAZOLE SODIUM 40 MG PO TBEC: 40.0000 mg | DELAYED_RELEASE_TABLET | Freq: Every day | ORAL | Status: DC | PRN

## 2021-05-26 MED ORDER — SIMVASTATIN 20 MG PO TABS
10.0000 mg | ORAL_TABLET | Freq: Every day | ORAL | Status: DC
  Administered 2021-05-26 – 2021-05-28 (×3): 10 mg via ORAL
  Filled 2021-05-26 (×3): qty 1

## 2021-05-26 MED ORDER — OXYCODONE-ACETAMINOPHEN 5-325 MG PO TABS
1.0000 | ORAL_TABLET | ORAL | Status: DC | PRN
  Administered 2021-05-28 – 2021-05-29 (×2): 1 via ORAL
  Filled 2021-05-26 (×2): qty 1

## 2021-05-26 MED ORDER — ALIGN PO CAPS: 1.0000 | ORAL_CAPSULE | Freq: Every day | ORAL | Status: DC

## 2021-05-26 MED ORDER — AMIODARONE HCL 200 MG PO TABS
200.0000 mg | ORAL_TABLET | Freq: Every day | ORAL | Status: DC
  Administered 2021-05-27 – 2021-05-29 (×3): 200 mg via ORAL
  Filled 2021-05-26 (×3): qty 1

## 2021-05-26 MED ORDER — HYDRALAZINE HCL 20 MG/ML IJ SOLN: 5.0000 mg | INTRAMUSCULAR | Status: DC | PRN

## 2021-05-26 MED ORDER — SPIRONOLACTONE 25 MG PO TABS: 12.5000 mg | ORAL_TABLET | Freq: Every day | ORAL | Status: DC

## 2021-05-26 MED ORDER — MAGNESIUM OXIDE -MG SUPPLEMENT 400 (240 MG) MG PO TABS
200.0000 mg | ORAL_TABLET | Freq: Every day | ORAL | Status: DC
  Administered 2021-05-27 – 2021-05-29 (×3): 200 mg via ORAL
  Filled 2021-05-26 (×3): qty 1

## 2021-05-26 MED ORDER — ACETAMINOPHEN 500 MG PO TABS
1000.0000 mg | ORAL_TABLET | Freq: Once | ORAL | Status: AC
  Administered 2021-05-26: 1000 mg via ORAL
  Filled 2021-05-26: qty 2

## 2021-05-26 MED ORDER — COQ-10 100 MG PO CAPS: 100.0000 mg | ORAL_CAPSULE | Freq: Every day | ORAL | Status: DC

## 2021-05-26 MED ORDER — GABAPENTIN 300 MG PO CAPS
300.0000 mg | ORAL_CAPSULE | Freq: Three times a day (TID) | ORAL | Status: DC
  Administered 2021-05-26 – 2021-05-29 (×8): 300 mg via ORAL
  Filled 2021-05-26 (×8): qty 1

## 2021-05-26 MED ORDER — MORPHINE SULFATE (PF) 2 MG/ML IV SOLN: 0.5000 mg | INTRAVENOUS | Status: DC | PRN

## 2021-05-26 MED ORDER — ONDANSETRON HCL 4 MG/2ML IJ SOLN: 4.0000 mg | Freq: Three times a day (TID) | INTRAMUSCULAR | Status: DC | PRN

## 2021-05-26 MED ORDER — FLUTICASONE PROPIONATE 50 MCG/ACT NA SUSP
1.0000 | Freq: Every day | NASAL | Status: DC | PRN
Start: 2021-05-26 — End: 2021-05-29
  Filled 2021-05-26: qty 16

## 2021-05-26 MED ORDER — ADULT MULTIVITAMIN W/MINERALS CH
1.0000 | ORAL_TABLET | Freq: Every day | ORAL | Status: DC
  Administered 2021-05-27 – 2021-05-29 (×3): 1 via ORAL
  Filled 2021-05-26 (×3): qty 1

## 2021-05-26 MED ORDER — CARVEDILOL 12.5 MG PO TABS
12.5000 mg | ORAL_TABLET | Freq: Two times a day (BID) | ORAL | Status: DC
  Administered 2021-05-27: 12.5 mg via ORAL
  Filled 2021-05-26 (×3): qty 1

## 2021-05-26 NOTE — ED Triage Notes (Signed)
Pt brought  in by [facility] from [facility] after falling yesterday. Pt states she fell asleep at the table and states slid into the floor. Pt has hx of the same. Pt was dx with Covid 1 week ago. Pt has had decreased intake since. Pt has hematoma to right forehead and co pain to right hip.

## 2021-05-26 NOTE — ED Notes (Signed)
Patient placed on Purewick. Urine was noted in brief that was changed.

## 2021-05-26 NOTE — ED Notes (Signed)
Rn aware of bed assigned 

## 2021-05-26 NOTE — H&P (Signed)
History and Physical    Mackenzie Locketriscilla Y Sistrunk ZOX:[id number] DOB: 10-17-1931 DOA: 05/26/2021  Referring MD/NP/PA:   PCP: Mick SellFitzgerald, David P, MD   Patient coming from:  The patient is coming from independent living facility   Chief Complaint: Fall  HPI: Mackenzie Walsh is a 87 y.o. female with medical history significant of hypertension, hyperlipidemia, COPD on 2 L oxygen, GERD, hypothyroidism, atrial fibrillation on Xarelto, chronic pain, AICD, mitral valve regurgitation, migraine headaches, anemia, CKD-3A, dCHF, who presents with fall.  Per report, pt fell asleep at the table and slid into the floor. Not sure if she lost consciousness or not.  Patient complains of right hip pain, cannot walk on right leg due to severe pain. She has a hematoma in right forehead.  Patient patient was diagnosed with COVID approximately 1 week ago.  She has mild dry cough, no shortness of breath or chest pain.  No fever or chills.  She has poor appetite and decreased oral intake.  No nausea, vomiting, diarrhea or abdominal pain.  No symptoms of UTI.  ED Course: pt was found to have positive COVID test, troponin level 15, WBC 8.0, renal function close to baseline, temperature normal, soft blood pressure 99/68, heart rate 78, RR 20, oxygen saturation 97% on home level of 3 L oxygen.  Chest x-ray negative.  CT of head and neck negative for acute injury.  CT of right hip showed pubic rami fracture.  Patient is placed on MedSurg bed observation.  CT-right hip 1. Acute minimally displaced fracture involving the parasymphyseal aspect of the right pubic bone extending into the right superior pubic ramus. 2. Additional nondisplaced fracture involving the midportion of the right inferior pubic ramus. 3. Small focal hematoma along the superior margin of the right superior pubic ramus fracture site measuring 3.1 x 1.9 x 2.3 cm.   CT-right hip: 1. Acute minimally displaced fracture involving the parasymphyseal aspect  of the right pubic bone extending into the right superior pubic ramus. 2. Additional nondisplaced fracture involving the midportion of the right inferior pubic ramus. 3. Small focal hematoma along the superior margin of the right superior pubic ramus fracture site measuring 3.1 x 1.9 x 2.3 cm.  Review of Systems:   General: no fevers, chills, no body weight gain, has poor appetite, has fatigue HEENT: no blurry vision, hearing changes or sore throat Respiratory: no dyspnea, has coughing, no wheezing CV: no chest pain, no palpitations GI: no nausea, vomiting, abdominal pain, diarrhea, constipation GU: no dysuria, burning on urination, increased urinary frequency, hematuria  Ext: no leg edema Neuro: no unilateral weakness, numbness, or tingling, no vision change or hearing loss. Has fall. Skin: no rash, no skin tear. MSK: has right hip pain Heme: No easy bruising.  Travel history: No recent long distant travel.  Allergy:  Allergies  Allergen Reactions   Erythromycin Base Hives   Gluten Meal Other (See Comments)    Celiac Disease   Dofetilide Rash    Past Medical History:  Diagnosis Date   A-fib (HCC)    AICD (automatic cardioverter/defibrillator) present    Allergy    Anemia    Arrhythmia    Atrial fibrillation (HCC)    Broken femur (HCC) 2017   Cataract    Celiac disease    Chronic pain syndrome    Chronic systolic CHF (congestive heart failure) (HCC)    CKD (chronic kidney disease)    COPD (chronic obstructive pulmonary disease) (HCC)    Dilated cardiomyopathy (HCC)  Dysrhythmia    Hyperlipidemia    Hypertension    Iron deficiency anemia 12/09/2020   Migraines    Mitral valve regurgitation    Osteoporosis    Posterior rhinorrhea    Presence of permanent cardiac pacemaker    Scoliosis deformity of spine    Senile hyperkeratosis    Thyroid disease    hypothyroid   Ventricular tachyarrhythmia     Past Surgical History:  Procedure Laterality Date    ablation arrythmia focus     APPENDECTOMY  1945   CARDIAC DEFIBRILLATOR PLACEMENT  2016   CARDIAC SURGERY     CARDIOVERSION EXTERNAL X4     COLONOSCOPY N/A 04/10/2021   Procedure: COLONOSCOPY;  Surgeon: Jaynie Collins, DO;  Location: [facility] ENDOSCOPY;  Service: Gastroenterology;  Laterality: N/A;   CORONARY ARTERY BYPASS GRAFT     ESOPHAGOGASTRODUODENOSCOPY (EGD) WITH PROPOFOL N/A 04/10/2021   Procedure: ESOPHAGOGASTRODUODENOSCOPY (EGD) WITH PROPOFOL;  Surgeon: Jaynie Collins, DO;  Location: [facility] ENDOSCOPY;  Service: Gastroenterology;  Laterality: N/A;   PACEMAKER IMPLANT     REPLACEMENT TOTAL KNEE RIGHT  2016   TONSILLECTOMY  1945    Social History:  reports that she has never smoked. She has never used smokeless tobacco. She reports that she does not currently use alcohol. She reports that she does not use drugs.  Family History:  Family History  Problem Relation Age of Onset   Pneumonia Mother    Osteoporosis Mother    Stroke Father    Heart failure Father    Hypertension Father    Obesity Father    Heart failure Sister      Prior to Admission medications   Medication Sig Start Date End Date Taking? Authorizing Provider  Acetaminophen 500 MG capsule Take 2 capsules (1,000 mg total) by mouth 3 times/day as needed-between meals & bedtime. 01/17/20   Danford, Earl Lites, MD  albuterol (PROVENTIL) (2.5 MG/3ML) 0.083% nebulizer solution Take 3 mLs (2.5 mg total) by nebulization every 4 (four) hours as needed for wheezing or shortness of breath. Patient not taking: Reported on 05/13/2021 01/17/20 02/05/21  Alberteen Sam, MD  amiodarone (PACERONE) 200 MG tablet Take 200 mg by mouth daily.  08/07/19   [provider]  bifidobacterium infantis (ALIGN) capsule Take 1 capsule by mouth daily.    [provider]  CALCIUM CITRATE PO Take 600 mg by mouth 2 (two) times daily as needed.    [provider]  carvedilol (COREG) 12.5 MG tablet Take  12.5 mg by mouth 2 (two) times daily with a meal.     [provider]  CITRACAL MAXIMUM 315-250 MG-UNIT TABS  07/03/19   [provider]  Coenzyme Q10 (COQ-10) 100 MG CAPS Take 100 mg by mouth daily.     [provider]  fluticasone (FLONASE) 50 MCG/ACT nasal spray Place 1-2 sprays into both nostrils daily.  09/15/19   [provider]  furosemide (LASIX) 40 MG tablet Take 20 mg by mouth daily.    [provider]  gabapentin (NEURONTIN) 300 MG capsule Take 300 mg by mouth 3 (three) times daily.     [provider]  LAGEVRIO 200 MG CAPS capsule Take 4 capsules by mouth 2 (two) times daily. 05/19/21   [provider]  levothyroxine (SYNTHROID) 112 MCG tablet Take 112 mcg by mouth daily before breakfast.  06/29/19   [provider]  losartan (COZAAR) 25 MG tablet Take 25 mg by mouth daily.  09/19/19  [provider]  Magnesium Oxide 200 MG TABS Take 200 mg by mouth daily.     [provider]  Multiple Vitamin (MULTI-VITAMIN DAILY PO) Take 1 tablet by mouth daily.     [provider]  omeprazole (PRILOSEC) 40 MG capsule Take 40 mg by mouth daily. Patient not taking: Reported on 05/13/2021 12/05/19   [provider]  predniSONE (DELTASONE) 20 MG tablet Take 1 tablet by mouth See admin instructions. Take 3 tablets by mouth for 2 days, then  Take 2 tablets by mouth for 2 days, then  Take 1 tablet by mouth for 2 days 05/19/21   [provider]  Rivaroxaban (XARELTO) 15 MG TABS tablet Take 15 mg by mouth daily with supper.  10/03/19   [provider]  simvastatin (ZOCOR) 10 MG tablet Take 10 mg by mouth at bedtime. 10/16/19   [provider]  spironolactone (ALDACTONE) 25 MG tablet Take 12.5 mg by mouth daily.  09/19/19   [provider]    Physical Exam: Vitals:   05/26/21 0954 05/26/21 1416 05/26/21 1430 05/26/21 1639  BP: 99/68 115/75 120/72 90/61  Pulse: 80 79 78 81   Resp: 20 16 16    Temp: 98.1 F (36.7 C)     TempSrc: Oral     SpO2: 96% 99% 97% 94%  Weight: 49.9 kg     Height: 5\' 2"  (1.575 m)      General: Not in acute distress HEENT:       Eyes: PERRL, EOMI, no scleral icterus.       ENT: No discharge from the ears and nose, no pharynx injection, no tonsillar enlargement.        Neck: No JVD, no bruit, no mass felt. Heme: No neck lymph node enlargement. Cardiac: S1/S2, RRR, No gallops or rubs. Respiratory: No rales, wheezing, rhonchi or rubs. GI: Soft, nondistended, nontender, no rebound pain, no organomegaly, BS present. GU: No hematuria Ext: No pitting leg edema bilaterally. 1+DP/PT pulse bilaterally. Musculoskeletal: has tenderness in right hip and pelvis Skin: No rashes.  Neuro: Alert, oriented X3, cranial nerves II-XII grossly intact, moves all extremities normally.  Psych: Patient is not psychotic, no suicidal or hemocidal ideation.  Labs on Admission: I have personally reviewed following labs and imaging studies  CBC: Recent Labs  Lab 05/26/21 1030  WBC 8.0  HGB 10.5*  HCT 33.0*  MCV 110.0*  PLT 163   Basic Metabolic Panel: Recent Labs  Lab 05/26/21 1030  NA 135  K 4.1  CL 103  CO2 27  GLUCOSE 93  BUN 35*  CREATININE 1.13*  CALCIUM 8.5*   GFR: Estimated Creatinine Clearance: 27.6 mL/min (A) (by C-G formula based on SCr of 1.13 mg/dL (H)). Liver Function Tests: Recent Labs  Lab 05/26/21 1030  AST 41  ALT 91*  ALKPHOS 72  BILITOT 1.4*  PROT 5.7*  ALBUMIN 3.1*   No results for input(s): LIPASE, AMYLASE in the last 168 hours. No results for input(s): AMMONIA in the last 168 hours. Coagulation Profile: No results for input(s): INR, PROTIME in the last 168 hours. Cardiac Enzymes: No results for input(s): CKTOTAL, CKMB, CKMBINDEX, TROPONINI in the last 168 hours. BNP (last 3 results) No results for input(s): PROBNP in the last 8760 hours. HbA1C: No results for input(s): HGBA1C in the last 72  hours. CBG: No results for input(s): GLUCAP in the last 168 hours. Lipid Profile: No results for input(s): CHOL, HDL, LDLCALC, TRIG, CHOLHDL, LDLDIRECT in the last 72  hours. Thyroid Function Tests: No results for input(s): TSH, T4TOTAL, FREET4, T3FREE, THYROIDAB in the last 72 hours. Anemia Panel: No results for input(s): VITAMINB12, FOLATE, FERRITIN, TIBC, IRON, RETICCTPCT in the last 72 hours. Urine analysis: No results found for: COLORURINE, APPEARANCEUR, LABSPEC, PHURINE, GLUCOSEU, HGBUR, BILIRUBINUR, KETONESUR, PROTEINUR, UROBILINOGEN, NITRITE, LEUKOCYTESUR Sepsis Labs: @LABRCNTIP (procalcitonin:4,lacticidven:4) ) Recent Results (from the past 240 hour(s))  Resp Panel by RT-PCR (Flu A&B, Covid) Nasopharyngeal Swab     Status: Abnormal   Collection Time: 05/26/21 10:30 AM   Specimen: Nasopharyngeal Swab; Nasopharyngeal(NP) swabs in vial transport medium  Result Value Ref Range Status   SARS Coronavirus 2 by RT PCR POSITIVE (A) NEGATIVE Final    Comment: RESULT CALLED TO, READ BACK BY AND VERIFIED WITH: C/LORRIE LEMINS 1221 05/26/21 AMK (NOTE) SARS-CoV-2 target nucleic acids are DETECTED.  The SARS-CoV-2 RNA is generally detectable in upper respiratory specimens during the acute phase of infection. Positive results are indicative of the presence of the identified virus, but do not rule out bacterial infection or co-infection with other pathogens not detected by the test. Clinical correlation with patient history and other diagnostic information is necessary to determine patient infection status. The expected result is Negative.  Fact Sheet for Patients: EntrepreneurPulse.com.au  Fact Sheet for Healthcare Providers: IncredibleEmployment.be  This test is not yet approved or cleared by the [place] FDA and  has been authorized for detection and/or diagnosis of SARS-CoV-2 by FDA under an Emergency Use Authorization (EUA).  This EUA  will remain in effect (meaning this test can be  used) for the duration of  the COVID-19 declaration under Section 564(b)(1) of the Act, 21 U.S.C. section 360bbb-3(b)(1), unless the authorization is terminated or revoked sooner.     Influenza A by PCR NEGATIVE NEGATIVE Final   Influenza B by PCR NEGATIVE NEGATIVE Final    Comment: (NOTE) The Xpert Xpress SARS-CoV-2/FLU/RSV plus assay is intended as an aid in the diagnosis of influenza from Nasopharyngeal swab specimens and should not be used as a sole basis for treatment. Nasal washings and aspirates are unacceptable for Xpert Xpress SARS-CoV-2/FLU/RSV testing.  Fact Sheet for Patients: EntrepreneurPulse.com.au  Fact Sheet for Healthcare Providers: IncredibleEmployment.be  This test is not yet approved or cleared by the [place] FDA and has been authorized for detection and/or diagnosis of SARS-CoV-2 by FDA under an Emergency Use Authorization (EUA). This EUA will remain in effect (meaning this test can be used) for the duration of the COVID-19 declaration under Section 564(b)(1) of the Act, 21 U.S.C. section 360bbb-3(b)(1), unless the authorization is terminated or revoked.  Performed at [facility], [address] [place], [place] [address]      Radiological Exams on Admission: DG Chest 1 View  Result Date: 05/26/2021 CLINICAL DATA:  weakness EXAM: CHEST  1 VIEW COMPARISON:  01/15/2020. FINDINGS: No consolidation. No visible pleural effusions or pneumothorax. Similar cardiomediastinal silhouette. Left subclavian approach cardiac rhythm maintenance device. Osteopenia. Thoracic spine compression fractures are not well evaluated on this single PA radiograph. IMPRESSION: No evidence of acute cardiopulmonary disease. Electronically Signed   By: Margaretha Sheffield M.D.   On: 05/26/2021 11:35   CT HEAD WO CONTRAST (5MM)  Result Date: 05/26/2021 CLINICAL DATA:  87 year old female  with history of neck trauma from a fall. EXAM: CT HEAD WITHOUT CONTRAST CT CERVICAL SPINE WITHOUT CONTRAST TECHNIQUE: Multidetector CT imaging of the head and cervical spine was performed following the standard protocol without intravenous contrast. Multiplanar CT image reconstructions of the cervical spine  were also generated. COMPARISON:  No priors. FINDINGS: CT HEAD FINDINGS Brain: Moderate cerebral and mild cerebellar atrophy. Patchy and confluent areas of decreased attenuation are noted throughout the deep and periventricular white matter of the cerebral hemispheres bilaterally, compatible with chronic microvascular ischemic disease. No evidence of acute infarction, hemorrhage, hydrocephalus, extra-axial collection or mass lesion/mass effect. Vascular: No hyperdense vessel or unexpected calcification. Skull: Normal. Negative for fracture or focal lesion. Sinuses/Orbits: Multifocal mucosal thickening throughout the visualized paranasal sinuses, compatible with chronic sinusitis. Air-fluid levels are also noted in the frontal sinuses bilaterally (left greater than right). Other: None. CT CERVICAL SPINE FINDINGS Alignment: Exaggeration of normal cervical lordosis, likely positional. Alignment is otherwise anatomic. Skull base and vertebrae: Choose chronic compression fracture of superior endplate of T1, similar to prior chest CT 06/11/2020, with 25% loss of anterior vertebral body height. No acute fracture. No primary bone lesion or focal pathologic process. Soft tissues and spinal canal: No prevertebral fluid or swelling. No visible canal hematoma. Disc levels: Mild multilevel degenerative disc disease. Moderate multilevel facet arthropathy, most severe in the left side. Upper chest: Unremarkable. Other: None. IMPRESSION: 1. No evidence of significant acute traumatic injury to the skull, brain or cervical spine. 2. Sinusitis with both acute and chronic features, as above. 3. Moderate cerebral and mild cerebellar  atrophy with extensive chronic microvascular ischemic changes in the cerebral white matter, as above. 4. Mild multilevel degenerative disc disease and cervical spondylosis. Electronically Signed   By: Vinnie Langton M.D.   On: 05/26/2021 11:19   CT Cervical Spine Wo Contrast  Result Date: 05/26/2021 CLINICAL DATA:  87 year old female with history of neck trauma from a fall. EXAM: CT HEAD WITHOUT CONTRAST CT CERVICAL SPINE WITHOUT CONTRAST TECHNIQUE: Multidetector CT imaging of the head and cervical spine was performed following the standard protocol without intravenous contrast. Multiplanar CT image reconstructions of the cervical spine were also generated. COMPARISON:  No priors. FINDINGS: CT HEAD FINDINGS Brain: Moderate cerebral and mild cerebellar atrophy. Patchy and confluent areas of decreased attenuation are noted throughout the deep and periventricular white matter of the cerebral hemispheres bilaterally, compatible with chronic microvascular ischemic disease. No evidence of acute infarction, hemorrhage, hydrocephalus, extra-axial collection or mass lesion/mass effect. Vascular: No hyperdense vessel or unexpected calcification. Skull: Normal. Negative for fracture or focal lesion. Sinuses/Orbits: Multifocal mucosal thickening throughout the visualized paranasal sinuses, compatible with chronic sinusitis. Air-fluid levels are also noted in the frontal sinuses bilaterally (left greater than right). Other: None. CT CERVICAL SPINE FINDINGS Alignment: Exaggeration of normal cervical lordosis, likely positional. Alignment is otherwise anatomic. Skull base and vertebrae: Choose chronic compression fracture of superior endplate of T1, similar to prior chest CT 06/11/2020, with 25% loss of anterior vertebral body height. No acute fracture. No primary bone lesion or focal pathologic process. Soft tissues and spinal canal: No prevertebral fluid or swelling. No visible canal hematoma. Disc levels: Mild multilevel  degenerative disc disease. Moderate multilevel facet arthropathy, most severe in the left side. Upper chest: Unremarkable. Other: None. IMPRESSION: 1. No evidence of significant acute traumatic injury to the skull, brain or cervical spine. 2. Sinusitis with both acute and chronic features, as above. 3. Moderate cerebral and mild cerebellar atrophy with extensive chronic microvascular ischemic changes in the cerebral white matter, as above. 4. Mild multilevel degenerative disc disease and cervical spondylosis. Electronically Signed   By: Vinnie Langton M.D.   On: 05/26/2021 11:19   CT Hip Right Wo Contrast  Result Date: 05/26/2021 CLINICAL DATA:  Right  hip pain after fall 1 day ago EXAM: CT OF THE RIGHT HIP WITHOUT CONTRAST TECHNIQUE: Multidetector CT imaging of the right hip was performed according to the standard protocol. Multiplanar CT image reconstructions were also generated. COMPARISON:  X-ray 05/26/2021 FINDINGS: Bones/Joint/Cartilage Acute minimally displaced fracture involving the parasymphyseal aspect of the right pubic bone extending into the right superior pubic ramus. Additional nondisplaced fracture involving the midportion of the right inferior pubic ramus. Pubic symphysis intact without diastasis. Mild arthropathy of the pubic symphysis with chondrocalcinosis. Right hip joint is intact without fracture or dislocation. Right hip joint space is relatively well preserved. Bones appear demineralized. Ligaments Suboptimally assessed by CT. Muscles and Tendons No acute musculotendinous abnormality by CT. Soft tissues Small focal hematoma along the superior margin of the right superior pubic ramus fracture site measuring 3.1 x 1.9 x 2.3 cm. IMPRESSION: 1. Acute minimally displaced fracture involving the parasymphyseal aspect of the right pubic bone extending into the right superior pubic ramus. 2. Additional nondisplaced fracture involving the midportion of the right inferior pubic ramus. 3. Small  focal hematoma along the superior margin of the right superior pubic ramus fracture site measuring 3.1 x 1.9 x 2.3 cm. Electronically Signed   By: Davina Poke D.O.   On: 05/26/2021 13:41   DG Hip Unilat W or Wo Pelvis 2-3 Views Right  Result Date: 05/26/2021 CLINICAL DATA:  Hip pain EXAM: DG HIP (WITH OR WITHOUT PELVIS) 2-3V RIGHT COMPARISON:  CT abdomen/pelvis 05/08/2021 FINDINGS: Postsurgical changes reflecting intramedullary nail and screw fixation of the left hip are noted. Deformity of the proximal left femur is similar to the prior CT abdomen/pelvis. No right hip fracture is seen. Femoroacetabular alignment is normal. The SI joints and symphysis pubis are intact. IMPRESSION: 1. No evidence of right hip fracture. 2. Postsurgical changes in the left hip without definite evidence of perihardware fracture. Electronically Signed   By: Valetta Mole M.D.   On: 05/26/2021 11:35     EKG: I have personally reviewed.  Paced rhythm, QTC 509  Assessment/Plan Principal Problem:   Fall Active Problems:   Chronic diastolic CHF (congestive heart failure) (HCC)   A-fib (HCC)   COPD (chronic obstructive pulmonary disease) (HCC)   Hyperlipidemia   Hypertension   Hypothyroidism   CKD (chronic kidney disease), stage IIIa   Macrocytic anemia   Pubic ramus fracture ([place])   COVID-19 virus infection   Fall and pubic ramus fracture: ED physician discussed with Dr. Posey Pronto of orthopedic surgery.  He recommended weightbearing as tolerated.  Patient is from independent living facility, patient may need to go back to the same facility, but to SNF unit  -place MedSurg bed for observation -Fall precaution -PT/OT -As needed Tylenol, Percocet, morphine for pain -As needed Robaxin for muscle spasm -TOC consult  Chronic diastolic CHF (congestive heart failure) ([place]): 2D echo on 11/28/2020 showed EF > 55%.  Patient does not have leg edema or JVD.  CHF seem to be compensated.  Blood pressures are soft, will hold  diuretics. -Hold Lasix and spironolactone -Check BNP  A-fib (HCC) -Hold Xarelto due to hematoma -Continue Coreg, amiodarone  COPD (chronic obstructive pulmonary disease) ([place]):: Stable -Bronchodilators  Hyperlipidemia -Zocor  Hypertension: Blood pressure soft -Continue Coreg -Hold Cozaar, Lasix, spironolactone  Hypothyroidism -Synthroid  CKD (chronic kidney disease), stage IIIa: Stable -Follow-up with BMP  Macrocytic anemia: Hemoglobin 10.5 -Follow-up with CBC  COVID-19 virus infection: Patient has minimal symptoms -As needed Mucinex, albuterol   DVT ppx: SCD Code Status: DNR  per her daughter Family Communication: Yes, patient's daughter by phone Disposition Plan:  Anticipate discharge back to independent living facility skilled nursing home unit Consults called:  EDP discussed with Dr. Posey Pronto of orthopedic surgery Admission status and Level of care: Telemetry Medical:     for obs      Status is: Observation  The patient remains OBS appropriate and will d/c before 2 midnights.         Date of Service 05/26/2021    Ivor Costa [facility]   If 7PM-7AM, please contact night-coverage [email or web address] 05/26/2021, 5:59 PM

## 2021-05-26 NOTE — Evaluation (Signed)
Physical Therapy Evaluation Patient Details Name: Mackenzie Walsh MRN: [id number] DOB: March 31, 1932 Today's Date: 05/26/2021  History of Present Illness  Pt is an 87 y.o. female presenting to hospital 05/26/21 after a fall (pt dozed off and fell to the ground while at dining hall); c/o R hip pain and unable to bear weight.  CT scan of the right hip demonstrates an inferior and superior pubic ramus fracture with a small hematoma.   PMH includes a-fib, AICD, anemia, a-fib, broken femur 2017, chronic pain syndrome, chronic systolic CHF, CKD, COPD, htn, permanent cardiac pacemaker, scoliosis deformity of spine, cardiac surgery, CABG, and R TKR.  Clinical Impression  Prior to hospital admission, pt was modified independent with functional mobility (no AD use in home but used 4ww vs SPC outside of home); lives at [facility] at [place].  Currently pt is min to mod assist with bed mobility; min assist to stand from higher stretcher bed up to RW; and pt unable to take any steps (to walk) with RW use and 1 assist (pt unable to offload R LE (via UE support through RW) in order to take any steps with L LE d/t R hip/pelvic pain).  Pt would benefit from skilled PT to address noted impairments and functional limitations (see below for any additional details).  Upon hospital discharge, pt would benefit from SNF.    Recommendations for follow up therapy are one component of a multi-disciplinary discharge planning process, led by the attending physician.  Recommendations may be updated based on patient status, additional functional criteria and insurance authorization.  Follow Up Recommendations Skilled nursing-short term rehab (<3 hours/day)    Assistance Recommended at Discharge Frequent or constant Supervision/Assistance  Functional Status Assessment Patient has had a recent decline in their functional status and demonstrates the ability to make significant improvements in function in a reasonable and  predictable amount of time.  Equipment Recommendations  Rolling walker (2 wheels);BSC/3in1    Recommendations for Other Services OT consult     Precautions / Restrictions Precautions Precautions: Fall Precaution Comments: Monitor BP Restrictions Other Position/Activity Restrictions: Per ED physician note 12/5: "Discussed with Dr. Posey Pronto who recommends weightbearing as tolerated"      Mobility  Bed Mobility Overal bed mobility: Needs Assistance Bed Mobility: Supine to Sit;Sit to Supine     Supine to sit: Min assist;Mod assist;HOB elevated (assist for trunk and B LE's) Sit to supine: Min assist;Mod assist;HOB elevated (assist for B LE's)   General bed mobility comments: vc's for technique; increased effort/time to perform d/t R pelvic pain    Transfers Overall transfer level: Needs assistance Equipment used: Rolling walker (2 wheels) Transfers: Sit to/from Stand Sit to Stand: Min assist;From elevated surface           General transfer comment: assist to stand and control descent sitting from higher stretcher bed; vc's for technique    Ambulation/Gait Ambulation/Gait assistance: Min assist;Mod assist Gait Distance (Feet): 0 Feet Assistive device: Rolling walker (2 wheels)         General Gait Details: pt unable to offload R LE (via UE support through RW) in order to take any steps with L LE (max vc's for technique and walker use)  Stairs            Wheelchair Mobility    Modified Rankin (Stroke Patients Only)       Balance Overall balance assessment: Needs assistance Sitting-balance support: Single extremity supported;Feet supported Sitting balance-Leahy Scale: Fair Sitting balance - Comments: steady static  sitting   Standing balance support: Bilateral upper extremity supported Standing balance-Leahy Scale: Fair Standing balance comment: steady static standing (decreased WB'ing noted through R LE)                              Pertinent Vitals/Pain Pain Assessment: Faces Faces Pain Scale: Hurts a little bit (6-8/10 with activity) Pain Location: mild R pelvic pain at rest (2/10); 6-8/10 with activity Pain Descriptors / Indicators: Sharp;Shooting;Aching Pain Intervention(s): Limited activity within patient's tolerance;Monitored during session;Premedicated before session;Repositioned HR and O2 on 2 L via nasal cannula stable and WFL throughout treatment session.  BP 90/61 prior to OOB mobility.    Home Living Family/patient expects to be discharged to:: Private residence Living Arrangements: Alone   Type of Home: Apartment Home Access: Level entry;Elevator       Home Layout: One level Home Equipment: Rollator (4 wheels);Cane - single point Additional Comments: Independent Living at [facility].    Prior Function Prior Level of Function : Independent/Modified Independent             Mobility Comments: No AD use in apt; uses 4ww down hallway to door and then uses SPC outside.  No other falls in past 6 months. ADLs Comments: Has 1 hour apt cleaning assist every other week.  Can eat at dining hall.     Hand Dominance        Extremity/Trunk Assessment   Upper Extremity Assessment Upper Extremity Assessment: Generalized weakness    Lower Extremity Assessment Lower Extremity Assessment: RLE deficits/detail (L LE at least 3/5 AROM hip flexion, knee flexion/extension, and DF/PF) RLE Deficits / Details: at least 3/5 AROM DF/PF; fair R quad set strength RLE: Unable to fully assess due to pain       Communication   Communication: No difficulties  Cognition Arousal/Alertness: Awake/alert Behavior During Therapy: WFL for tasks assessed/performed Overall Cognitive Status: Within Functional Limits for tasks assessed                                          General Comments  Nursing cleared pt for participation in physical therapy.  Pt agreeable to PT session.     Exercises   Bed mobility and transfer training.   Assessment/Plan    PT Assessment Patient needs continued PT services  PT Problem List Decreased strength;Decreased activity tolerance;Decreased balance;Decreased mobility;Decreased knowledge of use of DME;Decreased knowledge of precautions;Pain       PT Treatment Interventions DME instruction;Gait training;Functional mobility training;Therapeutic activities;Therapeutic exercise;Balance training;Patient/family education    PT Goals (Current goals can be found in the Care Plan section)  Acute Rehab PT Goals Patient Stated Goal: to improve mobility PT Goal Formulation: With patient Time For Goal Achievement: 06/09/21 Potential to Achieve Goals: Good    Frequency 7X/week   Barriers to discharge Decreased caregiver support      Co-evaluation               AM-PAC PT "6 Clicks" Mobility  Outcome Measure Help needed turning from your back to your side while in a flat bed without using bedrails?: A Little Help needed moving from lying on your back to sitting on the side of a flat bed without using bedrails?: A Lot Help needed moving to and from a bed to a chair (including a wheelchair)?: A Lot Help needed standing  up from a chair using your arms (e.g., wheelchair or bedside chair)?: A Lot Help needed to walk in hospital room?: Total Help needed climbing 3-5 steps with a railing? : Total 6 Click Score: 11    End of Session Equipment Utilized During Treatment: Gait belt Activity Tolerance: Patient limited by pain Patient left: in bed;with call bell/phone within reach;Other (comment) (in stretcher bed with B rails up and bed in lowest position) Nurse Communication: Mobility status;Precautions PT Visit Diagnosis: Other abnormalities of gait and mobility (R26.89);Muscle weakness (generalized) (M62.81);History of falling (Z91.81);Pain Pain - Right/Left: Right Pain - part of body: Hip    Time: [id number] PT Time Calculation (min) (ACUTE ONLY):  31 min   Charges:   PT Evaluation $PT Eval Low Complexity: 1 Low PT Treatments $Therapeutic Activity: 8-22 mins       Leitha Bleak, PT 05/26/21, 5:41 PM

## 2021-05-26 NOTE — ED Triage Notes (Signed)
Pt arrived via ems from home, independent living at [facility]. EMS reports pt fell asleep at lunch at table, woke up on ground, unable to get up due to rt hip pain. Family assisted to bed, pt can stand with assistance but unable to bear weight, or move rt hip/leg. Pt a&o x 4.   Bp 110/66 HR 98 93% 2L [place] chronic

## 2021-05-26 NOTE — ED Provider Notes (Signed)
[facility]  ____________________________________________   Event Date/Time   First MD Initiated Contact with Patient 05/26/21 1221     (approximate)  I have reviewed the triage vital signs and the nursing notes.   HISTORY  Chief Complaint Fall    HPI JONALYN MEASE is a 87 y.o. female with past medical history of atrial fibrillation, CKD, chronic systolic heart failure with AICD, COPD on 3 L nasal cannula chronically who presents after a fall tonight.  Patient was sitting at the dining hall when she "dozed off" and fell to the ground.  She fell on her right hip, did hit her head.  Unclear if she lost consciousness.  Is coming by her daughter-in-law who notes that the 2 of them are both getting over [place].  The patient denies any fevers chills decreased appetite shortness of breath.  Does have a mild cough.  Denies urinary symptoms.  Patient complains of pain in the right hip and was unable to bear weight on it this morning.         Past Medical History:  Diagnosis Date   A-fib [facility])    AICD (automatic cardioverter/defibrillator) present    Allergy    Anemia    Arrhythmia    Atrial fibrillation ([place])    Broken femur ([place]) 2017   Cataract    Celiac disease    Chronic pain syndrome    Chronic systolic CHF (congestive heart failure) (HCC)    CKD (chronic kidney disease)    COPD (chronic obstructive pulmonary disease) ([place])    Dilated cardiomyopathy ([place])    Dysrhythmia    Hyperlipidemia    Hypertension    Iron deficiency anemia 12/09/2020   Migraines    Mitral valve regurgitation    Osteoporosis    Posterior rhinorrhea    Presence of permanent cardiac pacemaker    Scoliosis deformity of spine    Senile hyperkeratosis    Thyroid disease    hypothyroid   Ventricular tachyarrhythmia     Patient Active Problem List   Diagnosis Date Noted   Normocytic anemia 12/09/2020   Anemia in stage 3b chronic kidney disease ([place]) 12/09/2020    Weight loss 12/09/2020   Iron deficiency anemia 12/09/2020   Abnormal SPEP 05/28/2020   Macrocytosis 05/28/2020   COPD with chronic bronchitis ([place]) 01/16/2020   Chronic diastolic CHF (congestive heart failure) ([place]) 01/16/2020   Chronic anticoagulation 01/16/2020   Acute respiratory failure with hypoxia ([place]) 01/16/2020   Fall at home, initial encounter 01/16/2020   Compression fracture of thoracic spine, non-traumatic ([place]) 01/16/2020   Hypoxia 01/16/2020    Past Surgical History:  Procedure Laterality Date   ablation arrythmia focus     [place]  2016   CARDIAC SURGERY     CARDIOVERSION EXTERNAL X4     COLONOSCOPY N/A 04/10/2021   Procedure: COLONOSCOPY;  Surgeon: Annamaria Helling, DO;  Location: [facility] ENDOSCOPY;  Service: Gastroenterology;  Laterality: N/A;   CORONARY ARTERY BYPASS GRAFT     ESOPHAGOGASTRODUODENOSCOPY (EGD) WITH PROPOFOL N/A 04/10/2021   Procedure: ESOPHAGOGASTRODUODENOSCOPY (EGD) WITH PROPOFOL;  Surgeon: Annamaria Helling, DO;  Location[facility] ENDOSCOPY;  Service: Gastroenterology;  Laterality: N/A;   PACEMAKER IMPLANT     REPLACEMENT TOTAL KNEE RIGHT  2016   TONSILLECTOMY  1945    Prior to Admission medications   Medication Sig Start Date End Date Taking? Authorizing Provider  Acetaminophen 500 MG capsule Take 2 capsules (1,000 mg  total) by mouth 3 times/day as needed-between meals & bedtime. 01/17/20   Danford, Earl Lites, MD  albuterol (PROVENTIL) (2.5 MG/3ML) 0.083% nebulizer solution Take 3 mLs (2.5 mg total) by nebulization every 4 (four) hours as needed for wheezing or shortness of breath. Patient not taking: Reported on 05/13/2021 01/17/20 02/05/21  Alberteen Sam, MD  amiodarone (PACERONE) 200 MG tablet Take 200 mg by mouth daily.  08/07/19   [provider]  bifidobacterium infantis (ALIGN) capsule Take 1 capsule by mouth daily.    [provider]  CALCIUM CITRATE PO Take  600 mg by mouth 2 (two) times daily as needed.    [provider]  carvedilol (COREG) 12.5 MG tablet Take 12.5 mg by mouth 2 (two) times daily with a meal.     [provider]  CITRACAL MAXIMUM 315-250 MG-UNIT TABS  07/03/19   [provider]  Coenzyme Q10 (COQ-10) 100 MG CAPS Take 100 mg by mouth daily.     [provider]  fluticasone (FLONASE) 50 MCG/ACT nasal spray Place 1-2 sprays into both nostrils daily.  09/15/19   [provider]  furosemide (LASIX) 40 MG tablet Take 20 mg by mouth daily.    [provider]  gabapentin (NEURONTIN) 300 MG capsule Take 300 mg by mouth 3 (three) times daily.     [provider]  levothyroxine (SYNTHROID) 112 MCG tablet Take 112 mcg by mouth daily before breakfast.  06/29/19   [provider]  losartan (COZAAR) 25 MG tablet Take 25 mg by mouth daily.  09/19/19   [provider]  Magnesium Oxide 200 MG TABS Take 200 mg by mouth daily.     [provider]  Multiple Vitamin (MULTI-VITAMIN DAILY PO) Take 1 tablet by mouth daily.     [provider]  omeprazole (PRILOSEC) 40 MG capsule Take 40 mg by mouth daily. Patient not taking: Reported on 05/13/2021 12/05/19   [provider]  Rivaroxaban (XARELTO) 15 MG TABS tablet Take 15 mg by mouth daily with supper.  10/03/19   [provider]  simvastatin (ZOCOR) 10 MG tablet Take 10 mg by mouth at bedtime. 10/16/19   [provider]  spironolactone (ALDACTONE) 25 MG tablet Take 12.5 mg by mouth daily.  09/19/19   [provider]    Allergies Erythromycin base, Gluten meal, and Dofetilide  Family History  Problem Relation Age of Onset   Pneumonia Mother    Osteoporosis Mother    Stroke Father    Heart failure Father    Hypertension Father    Obesity Father    Heart failure Sister     Social History Social History   Tobacco Use   Smoking status: Never   Smokeless tobacco: Never   Vaping Use   Vaping Use: Never used  Substance Use Topics   Alcohol use: Not Currently   Drug use: Never    Review of Systems   Review of Systems  Constitutional:  Negative for chills and fever.  Respiratory:  Positive for cough. Negative for shortness of breath.   Cardiovascular:  Negative for chest pain.  Gastrointestinal:  Negative for abdominal pain, nausea and vomiting.  Musculoskeletal:  Positive for arthralgias and myalgias.  Neurological:  Negative for headaches.  All other systems reviewed and are negative.  Physical Exam Updated Vital Signs BP 120/72   Pulse 78   Temp 98.1 F (36.7 C) (Oral)   Resp 16   Ht 5\' 2"  (1.575 m)  Wt 49.9 kg   SpO2 97%   BMI 20.12 kg/m   Physical Exam Vitals and nursing note reviewed.  Constitutional:      General: She is not in acute distress.    Appearance: Normal appearance.  HENT:     Head: Normocephalic and atraumatic.  Eyes:     General: No scleral icterus.    Conjunctiva/sclera: Conjunctivae normal.  Pulmonary:     Effort: Pulmonary effort is normal. No respiratory distress.     Breath sounds: No stridor.  Abdominal:     General: Abdomen is flat.     Palpations: Abdomen is soft.  Musculoskeletal:        General: No deformity or signs of injury.     Cervical back: Normal range of motion.     Comments: Pain with palpation over the right greater trochanter Pain with logrolling 2+ DP pulses bilaterally, 5 out of 5 strength with plantar flexion dorsiflexion No pain or swelling to the knee or ankle bilaterally  Skin:    General: Skin is dry.     Coloration: Skin is not jaundiced or pale.  Neurological:     General: No focal deficit present.     Mental Status: She is alert and oriented to person, place, and time. Mental status is at baseline.  Psychiatric:        Mood and Affect: Mood normal.        Behavior: Behavior normal.     LABS (all labs ordered are listed, but only abnormal results are displayed)  Labs  Reviewed  RESP PANEL BY RT-PCR (FLU A&B, COVID) ARPGX2 - Abnormal; Notable for the following components:      Result Value   SARS Coronavirus 2 by RT PCR POSITIVE (*)    All other components within normal limits  CBC - Abnormal; Notable for the following components:   RBC 3.00 (*)    Hemoglobin 10.5 (*)    HCT 33.0 (*)    MCV 110.0 (*)    MCH 35.0 (*)    All other components within normal limits  COMPREHENSIVE METABOLIC PANEL - Abnormal; Notable for the following components:   BUN 35 (*)    Creatinine, Ser 1.13 (*)    Calcium 8.5 (*)    Total Protein 5.7 (*)    Albumin 3.1 (*)    ALT 91 (*)    Total Bilirubin 1.4 (*)    GFR, Estimated 47 (*)    All other components within normal limits  TROPONIN I (HIGH SENSITIVITY)   ____________________________________________  EKG  V paced rhythm, low voltage in the limb leads, left axis deviation no obvious acute ischemic changes ____________________________________________  RADIOLOGY Almeta Monas, personally viewed and evaluated these images (plain radiographs) as part of my medical decision making, as well as reviewing the written report by the radiologist.  ED MD interpretation:  I reviewed the CT scan of the brain which does not show any acute intracranial process    I reviewed the CT of the cervical spine which does not show any acute fracture or misalignment       ____________________________________________   PROCEDURES  Procedure(s) performed (including Critical Care):  Procedures   ____________________________________________   INITIAL IMPRESSION / ASSESSMENT AND PLAN / ED COURSE  87 year old female presents after a fall from sitting that occurred last night.  She is unable to bear weight on the right hip secondary to pain today.  Denies other symptoms.  On exam she is frail-appearing but  nontoxic.  She has tenderness over the greater trochanter on the right and pain with logrolling but is neurovascularly  intact.  No obvious deformity or shortening.  X-ray of the hip does not show any obvious fracture but given her inability to bear weight will obtain a CT to rule out occult fracture.  CT head and C-spine are negative for acute findings.  Her labs are all at her baseline.  COVID test is positive but this is known to be since last week.  CT scan of the right hip demonstrates a inferior and superior pubic ramus fracture with a small hematoma.  Patient and her daughter-in-law prefer to be discharged home.  Patient currently stays at an assisted living facility but would be able to get 24-hour care.  Discussed with Dr. Posey Pronto who recommends weightbearing as tolerated and PT consult in the ED. we did discuss the hematoma and the fact that she is on Xarelto but he did not feel that she required any additional monitoring or serial hemoglobins.  Patient's daughter-in-law strongly wants for her to go back to the facility.  Currently trying to see if she can get 24-hour care versus be placed in the rehab facility.    Patient unable to get 24-hour care today but will likely be able to be placed in the rehab facility tomorrow.  Will admit to the hospital for PT evaluation and placement.   Clinical Course as of 05/26/21 1535  Mon May 26, 2021  1410 Physical therapy  [KM]    Clinical Course User Index [KM] Rada Hay, MD     ____________________________________________   FINAL CLINICAL IMPRESSION(S) / ED DIAGNOSES  Final diagnoses:  Closed fracture of ramus of right pubis, initial encounter [facility])     ED Discharge Orders     None        Note:  This document was prepared using Dragon voice recognition software and may include unintentional dictation errors.    Rada Hay, MD 05/26/21 1535

## 2021-05-27 ENCOUNTER — Encounter: Payer: Self-pay | Admitting: Internal Medicine

## 2021-05-27 DIAGNOSIS — I10 Essential (primary) hypertension: Secondary | ICD-10-CM | POA: Diagnosis not present

## 2021-05-27 DIAGNOSIS — W19XXXA Unspecified fall, initial encounter: Secondary | ICD-10-CM | POA: Diagnosis not present

## 2021-05-27 LAB — BASIC METABOLIC PANEL
Anion gap: 4 — ABNORMAL LOW (ref 5–15)
BUN: 27 mg/dL — ABNORMAL HIGH (ref 8–23)
CO2: 30 mmol/L (ref 22–32)
Calcium: 8.3 mg/dL — ABNORMAL LOW (ref 8.9–10.3)
Chloride: 102 mmol/L (ref 98–111)
Creatinine, Ser: 0.95 mg/dL (ref 0.44–1.00)
GFR, Estimated: 58 mL/min — ABNORMAL LOW (ref >60)
Glucose, Bld: 109 mg/dL — ABNORMAL HIGH (ref 70–99)
Potassium: 4.2 mmol/L (ref 3.5–5.1)
Sodium: 136 mmol/L (ref 135–145)

## 2021-05-27 LAB — MAGNESIUM: Magnesium: 1.9 mg/dL (ref 1.7–2.4)

## 2021-05-27 LAB — CBC
HCT: 31.3 % — ABNORMAL LOW (ref 36.0–46.0)
Hemoglobin: 9.8 g/dL — ABNORMAL LOW (ref 12.0–15.0)
MCH: 34 pg (ref 26.0–34.0)
MCHC: 31.3 g/dL (ref 30.0–36.0)
MCV: 108.7 fL — ABNORMAL HIGH (ref 80.0–100.0)
Platelets: 149 10*3/uL — ABNORMAL LOW (ref 150–400)
RBC: 2.88 MIL/uL — ABNORMAL LOW (ref 3.87–5.11)
RDW: 13.9 % (ref 11.5–15.5)
WBC: 5.3 10*3/uL (ref 4.0–10.5)
nRBC: 0 % (ref 0.0–0.2)

## 2021-05-27 LAB — VITAMIN B12: Vitamin B-12: 1210 pg/mL — ABNORMAL HIGH (ref 180–914)

## 2021-05-27 LAB — HEPATIC FUNCTION PANEL
ALT: 74 U/L — ABNORMAL HIGH (ref 0–44)
AST: 36 U/L (ref 15–41)
Albumin: 3 g/dL — ABNORMAL LOW (ref 3.5–5.0)
Alkaline Phosphatase: 72 U/L (ref 38–126)
Bilirubin, Direct: 0.3 mg/dL — ABNORMAL HIGH (ref 0.0–0.2)
Indirect Bilirubin: 0.6 mg/dL (ref 0.3–0.9)
Total Bilirubin: 0.9 mg/dL (ref 0.3–1.2)
Total Protein: 5.5 g/dL — ABNORMAL LOW (ref 6.5–8.1)

## 2021-05-27 LAB — FOLATE: Folate: 12.9 ng/mL (ref >5.9)

## 2021-05-27 LAB — PHOSPHORUS: Phosphorus: 2.6 mg/dL (ref 2.5–4.6)

## 2021-05-27 LAB — IRON AND TIBC
Iron: 34 ug/dL (ref 28–170)
Saturation Ratios: 12 % (ref 10.4–31.8)
TIBC: 281 ug/dL (ref 250–450)
UIBC: 247 ug/dL

## 2021-05-27 LAB — VITAMIN D 25 HYDROXY (VIT D DEFICIENCY, FRACTURES): Vit D, 25-Hydroxy: 61.65 ng/mL (ref 30–100)

## 2021-05-27 MED ORDER — ENSURE ENLIVE PO LIQD
237.0000 mL | Freq: Two times a day (BID) | ORAL | Status: DC
  Administered 2021-05-28 – 2021-05-29 (×2): 237 mL via ORAL

## 2021-05-27 NOTE — TOC Initial Note (Signed)
Transition of Care [facility]) - Initial/Assessment Note    Patient Details  Name: Mackenzie Walsh MRN: [id number] Date of Birth: 1931/10/25  Transition of Care [facility]) CM/SW Contact:    Caryn Section, RN Phone Number: 05/27/2021, 3:32 PM  Clinical Narrative:      Patient lives at [facility] at [place], requires SNF placement at this time.  [facility] can [facility] patient on Thursday due to COVID status.  Family aware.  TOC to follow             Expected Discharge Plan: Skilled Nursing Facility     Patient Goals and CMS Choice        Expected Discharge Plan and Services Expected Discharge Plan: Skilled Nursing Facility   Discharge Planning Services: CM Consult Post Acute Care Choice: Skilled Nursing Facility Living arrangements for the past 2 months: Assisted Living Facility                                      Prior Living Arrangements/Services Living arrangements for the past 2 months: Assisted Living Facility Lives with:: Facility Resident Patient language and need for interpreter reviewed:: Yes (spoke with daughter in law) Do you feel safe going back to the place where you live?: Yes ([facility] will be short term rehab)      Need for Family Participation in Patient Care: Yes (Comment) Care giver support system in place?: Yes (comment)   Criminal Activity/Legal Involvement Pertinent to Current Situation/Hospitalization: No - Comment as needed  Activities of Daily Living Home Assistive Devices/Equipment: Shower chair with back, Other (Comment) (rollator) ADL Screening (condition at time of admission) Patient's cognitive ability adequate to safely complete daily activities?: Yes Is the patient deaf or have difficulty hearing?: No Does the patient have difficulty seeing, even when wearing glasses/contacts?: No Does the patient have difficulty concentrating, remembering, or making decisions?: No Patient able to express need for assistance with ADLs?: Yes Does  the patient have difficulty dressing or bathing?: No Independently performs ADLs?: Yes (appropriate for developmental age) Does the patient have difficulty walking or climbing stairs?: Yes Weakness of Legs: Both Weakness of Arms/Hands: Both  Permission Sought/Granted   Permission granted to share information with : Yes, Verbal Permission Granted     Permission granted to share info w AGENCY: [facility]        Emotional Assessment         Alcohol / Substance Use: Not Applicable Psych Involvement: No (comment)  Admission diagnosis:  Fall [W19.XXXA] Closed fracture of ramus of right pubis, initial encounter [facility]) [S32.591A] Patient Active Problem List   Diagnosis Date Noted   Fall 05/26/2021   A-fib [facility])    COPD (chronic obstructive pulmonary disease) (HCC)    Hyperlipidemia    Hypertension    Hypothyroidism    CKD (chronic kidney disease), stage IIIa    Macrocytic anemia    Pubic ramus fracture (HCC)    COVID-19 virus infection    Normocytic anemia 12/09/2020   Anemia in stage 3b chronic kidney disease (HCC) 12/09/2020   Weight loss 12/09/2020   Iron deficiency anemia 12/09/2020   Abnormal SPEP 05/28/2020   Macrocytosis 05/28/2020   COPD with chronic bronchitis (HCC) 01/16/2020   Chronic diastolic CHF (congestive heart failure) (HCC) 01/16/2020   Chronic anticoagulation 01/16/2020   Acute respiratory failure with hypoxia (HCC) 01/16/2020   Fall at home, initial encounter 01/16/2020   Compression fracture  of thoracic spine, non-traumatic ([place]) 01/16/2020   Hypoxia 01/16/2020   PCP:  Leonel Ramsay, MD Pharmacy:   [facility], [place] - [address] [address] Phone: [phone] Fax: [phone]     Social Determinants of Health (SDOH) Interventions    Readmission Risk Interventions No flowsheet data found.

## 2021-05-27 NOTE — Evaluation (Signed)
Occupational Therapy Evaluation Patient Details Name: Mackenzie Walsh MRN: [id number] DOB: 04/20/1932 Today's Date: 05/27/2021   History of Present Illness Pt is an 87 y.o. female presenting to hospital 05/26/21 after a fall (pt dozed off and fell to the ground while at dining hall); c/o R hip pain and unable to bear weight.  CT scan of the right hip demonstrates an inferior and superior pubic ramus fracture with a small hematoma.   PMH includes a-fib, AICD, anemia, a-fib, broken femur 2017, chronic pain syndrome, chronic systolic CHF, CKD, COPD, htn, permanent cardiac pacemaker, scoliosis deformity of spine, cardiac surgery, CABG, and R TKR.   Clinical Impression   Chart reviewed, RN cleared pt for participation in OT evaluation following a fall and pelvic fractures. Pt greeted in bed, daughter in law present. Pt alert and oriented x 4, agreeable to OT evaluation. Pt reports "stiffness" throughout BLE. PTA pt lived in independent living, assistance with IADL, completed ADL with MOD I. Pt performs supine>sit with MOD A, STS with MOD A with RW, SPT with MAX A. Pt unable to take step with LLE due to pain. Pt performs seated grooming and feeding tasks with SET UP. Pt is left in bedside chair, NAD, all needs met. RN aware of pt status. OT recommends discharge to SAR to address functional deficits, will continue to follow while admitted.      Recommendations for follow up therapy are one component of a multi-disciplinary discharge planning process, led by the attending physician.  Recommendations may be updated based on patient status, additional functional criteria and insurance authorization.   Follow Up Recommendations  Skilled nursing-short term rehab (<3 hours/day)    Assistance Recommended at Discharge Frequent or constant Supervision/Assistance  Functional Status Assessment  Patient has had a recent decline in their functional status and demonstrates the ability to make significant improvements  in function in a reasonable and predictable amount of time.  Equipment Recommendations   (per next venue of care)    Recommendations for Other Services       Precautions / Restrictions Precautions Precautions: Fall Restrictions Weight Bearing Restrictions: Yes RLE Weight Bearing: Weight bearing as tolerated LLE Weight Bearing: Weight bearing as tolerated      Mobility Bed Mobility Overal bed mobility: Needs Assistance Bed Mobility: Supine to Sit     Supine to sit: Mod assist;HOB elevated     General bed mobility comments: vcs for technique    Transfers Overall transfer level: Needs assistance Equipment used: Rolling walker (2 wheels) Transfers: Sit to/from Stand;Bed to chair/wheelchair/BSC Sit to Stand: From elevated surface;Mod assist Stand pivot transfers: Max assist         General transfer comment: pt unable to take steps with LLE at time of transfer requring SPT      Balance Overall balance assessment: Needs assistance Sitting-balance support: Bilateral upper extremity supported;Feet supported Sitting balance-Leahy Scale: Fair     Standing balance support: Bilateral upper extremity supported;During functional activity Standing balance-Leahy Scale: Fair Standing balance comment: heavy reliance on RW                           ADL either performed or assessed with clinical judgement   ADL Overall ADL's : Needs assistance/impaired Eating/Feeding: Set up   Grooming: Wash/dry face;Sitting               Lower Body Dressing: Maximal assistance   Toilet Transfer: Maximal assistance Toilet Transfer Details (indicate cue type  and reason): SPT; simulated Toileting- Clothing Manipulation and Hygiene: Maximal assistance       Functional mobility during ADLs: Maximal assistance;Rolling walker (2 wheels) General ADL Comments: able to complete grooming and feeding tasks in seated with SET UP;     Vision Baseline Vision/History:  (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) Patient Visual Report: No change from baseline              Pertinent Vitals/Pain Pain Assessment: Faces Faces Pain Scale: Hurts even more Pain Location: no reported pain a trest, 8/10 with activity Pain Descriptors / Indicators: Tightness Pain Intervention(s): Limited activity within patient's tolerance;Repositioned;Premedicated before session;Monitored during session     Hand Dominance     Extremity/Trunk Assessment Upper Extremity Assessment Upper Extremity Assessment: Generalized weakness   Lower Extremity Assessment Lower Extremity Assessment: Generalized weakness   Cervical / Trunk Assessment Cervical / Trunk Assessment: Kyphotic   Communication Communication Communication: No difficulties   Cognition Arousal/Alertness: Awake/alert Behavior During Therapy: WFL for tasks assessed/performed Overall Cognitive Status: Within Functional Limits for tasks assessed                                 General Comments: alert and oriented x4        Exercises Other Exercises Other Exercises: educated pt and daugther in law re: role of OT, role of rehab, importance of progressive mobility, out of bed mobility        Home Living Family/patient expects to be discharged to:: Skilled nursing facility Living Arrangements: Alone   Type of Home: Apartment (Independent living facility) Home Access: Level entry;Elevator     Home Layout: One level               Home Equipment: Rollator (4 wheels);Cane - single point   Additional Comments: Independent Living at [facility].      Prior Functioning/Environment Prior Level of Function : Independent/Modified Independent             Mobility Comments: no AD use in apt; 4ww and SPC for community distances ADLs Comments: Cleaner 2x per month; dining hall use as needed; pt still cooks/cleans when she wants to        OT Problem List: Decreased strength;Impaired balance (sitting  and/or standing);Decreased knowledge of precautions;Pain;Decreased activity tolerance;Decreased knowledge of use of DME or AE;Impaired UE functional use      OT Treatment/Interventions: Self-care/ADL training;DME and/or AE instruction;Therapeutic activities;Patient/family education;Modalities;Therapeutic exercise    OT Goals(Current goals can be found in the care plan section) Acute Rehab OT Goals Patient Stated Goal: to go to rehab OT Goal Formulation: With patient/family Time For Goal Achievement: 06/10/21 Potential to Achieve Goals: Good ADL Goals Pt Will Perform Grooming: standing;with min assist Pt Will Perform Lower Body Bathing: with min assist;sitting/lateral leans Pt Will Transfer to Toilet: with min assist;bedside commode Pt Will Perform Toileting - Clothing Manipulation and hygiene: with min assist;sit to/from stand  OT Frequency: Min 3X/week    AM-PAC OT "6 Clicks" Daily Activity     Outcome Measure Help from another person eating meals?: None Help from another person taking care of personal grooming?: A Little Help from another person toileting, which includes using toliet, bedpan, or urinal?: A Lot Help from another person bathing (including washing, rinsing, drying)?: A Lot Help from another person to put on and taking off regular upper body clothing?: A Little Help from another person to put on and  taking off regular lower body clothing?: A Lot 6 Click Score: 16   End of Session Equipment Utilized During Treatment: Gait belt;Rolling walker (2 wheels) Nurse Communication: Mobility status  Activity Tolerance: Patient limited by pain Patient left: in chair;with call bell/phone within reach;with chair alarm set  OT Visit Diagnosis: Unsteadiness on feet (R26.81);Other abnormalities of gait and mobility (R26.89);History of falling (Z91.81)                Time: 1062-6948 OT Time Calculation (min): 34 min Charges:  OT General Charges $OT Visit: 1 Visit OT  Evaluation $OT Eval Moderate Complexity: 1 Mod OT Treatments $Self Care/Home Management : 8-22 mins  Shanon Payor, OTD OTR/L  05/27/21, 11:11 AM

## 2021-05-27 NOTE — Progress Notes (Signed)
[facility] Progress Note  Patient: Mackenzie Walsh    [id number]  DOA: 05/26/2021     Date of Service: the patient was seen and examined on 05/27/2021  Chief Complaint  Patient presents with   Fall   Brief hospital course: MIGUELINA FRATANGELO is a 87 y.o. female with medical history significant of hypertension, hyperlipidemia, COPD on 2 L oxygen, GERD, hypothyroidism, atrial fibrillation on Xarelto, chronic pain, AICD, mitral valve regurgitation, migraine headaches, anemia, CKD-3A, dCHF, who presents with fall. pt fell asleep at the table and slid on the floor. C/o right hip pain, not sure about LOC.  ED work-up patient was found to have COVID-positive and Chest x-ray negative.  CT of head and neck negative for acute injury.  CT of right hip showed pubic rami fracture.   CT-right hip 1. Acute minimally displaced fracture involving the parasymphyseal aspect of the right pubic bone extending into the right superior pubic ramus. 2. Additional nondisplaced fracture involving the midportion of the right inferior pubic ramus. 3. Small focal hematoma along the superior margin of the right superior pubic ramus fracture site measuring 3.1 x 1.9 x 2.3 cm.    Assessment and Plan: Principal Problem:   Fall Active Problems:   Chronic diastolic CHF (congestive heart failure) (HCC)   A-fib (HCC)   COPD (chronic obstructive pulmonary disease) (HCC)   Hyperlipidemia   Hypertension   Hypothyroidism   CKD (chronic kidney disease), stage IIIa   Macrocytic anemia   Pubic ramus fracture ([place])   COVID-19 virus infection     Fall and pubic ramus fracture: ED physician discussed with Dr. Posey Pronto of orthopedic surgery.  He recommended weightbearing as tolerated.  Patient is from independent living facility, patient may need to go back to the same facility, but to SNF unit Hematoma due to fall, apply ice pack to stop bleeding was advised to patient Continue Fall precaution -PT/OT -As needed  Tylenol, Percocet, morphine for pain -As needed Robaxin for muscle spasm -TOC consult Patient cannot go to SNF until Thursday due to COVID as per social worker   Chronic diastolic CHF (congestive heart failure) ([place]): 2D echo on 11/28/2020 showed EF > 55%.  Patient does not have leg edema or JVD.  CHF seem to be compensated.  Blood pressures are soft, will hold diuretics. -Hold Lasix and spironolactone -Check BNP   A-fib (HCC) -Hold Xarelto due to hematoma -Continue Coreg, amiodarone   COPD (chronic obstructive pulmonary disease) ([place]):: Stable -Bronchodilators   Hyperlipidemia -Zocor   Hypertension: Blood pressure soft -Continue Coreg -Hold Cozaar, Lasix, spironolactone   Hypothyroidism -Synthroid   CKD (chronic kidney disease), stage IIIa: Stable -Follow-up with BMP   Macrocytic anemia: Hemoglobin 10.5 Iron and folate within normal range -Follow-up with CBC   COVID-19 virus infection: Patient has minimal symptoms -As needed Mucinex, albuterol Saturating well on room air  Body mass index is 20.12 kg/m.  Interventions:       Diet: Heart healthy DVT Prophylaxis: SCD, pharmacological prophylaxis contraindicated due to pelvic hematoma due to fall    Advance goals of care discussion: DNR  Family Communication: family was present at bedside, at the time of interview.  The pt provided permission to discuss medical plan with the family. Opportunity was given to ask question and all questions were answered satisfactorily.   Disposition:  Pt is from ALF, admitted with Fall and pelvic fracture, COVID viral infection, still has pain, COVID-positive, which precludes a safe discharge. Discharge to SNF, when SNF will  take the patient most likely on Thursday as per TOC due to COVID.  Subjective: No significant overnight events, patient still complaining of pain in the right hip area due to fracture, denied any worsening of shortness of breath, no cough.  Patient is recovering  from COVID-19 infection.  Denied any chest pain or palpitations.  Physical Exam: General:  alert oriented to time, place, and person.  Appear in mild distress, affect appropriate Eyes: PERRLA ENT: Oral Mucosa Clear, moist  Neck: no JVD,  Cardiovascular: S1 and S2 Present, no Murmur,  Respiratory: good respiratory effort, Bilateral Air entry equal and Decreased, no Crackles, no wheezes Abdomen: Bowel Sound present, Soft and no tenderness,  Skin: no rashes Extremities: no Pedal edema, no calf tenderness Neurologic: without any new focal findings Gait not checked due to patient safety concerns  Vitals:   05/27/21 0550 05/27/21 0805 05/27/21 1054 05/27/21 1145  BP: 105/70 107/74  123/75  Pulse: 79 80  80  Resp: 17 16  18   Temp: 98.2 F (36.8 C) (!) 97.4 F (36.3 C)  (!) 97.4 F (36.3 C)  TempSrc:      SpO2: 97% 96% 95% 100%  Weight:      Height:        Intake/Output Summary (Last 24 hours) at 05/27/2021 1413 Last data filed at 05/27/2021 0900 Gross per 24 hour  Intake 420 ml  Output --  Net 420 ml   Filed Weights   05/26/21 0954 05/27/21 0500  Weight: 49.9 kg 49.9 kg    Data Reviewed: I have personally reviewed and interpreted daily labs, tele strips, imagings as discussed above. I reviewed all nursing notes, pharmacy notes, vitals, pertinent old records I have discussed plan of care as described above with RN and patient/family.  CBC: Recent Labs  Lab 05/26/21 1030 05/27/21 0432  WBC 8.0 5.3  HGB 10.5* 9.8*  HCT 33.0* 31.3*  MCV 110.0* 108.7*  PLT 163 149*   Basic Metabolic Panel: Recent Labs  Lab 05/26/21 1030 05/27/21 0847  NA 135 136  K 4.1 4.2  CL 103 102  CO2 27 30  GLUCOSE 93 109*  BUN 35* 27*  CREATININE 1.13* 0.95  CALCIUM 8.5* 8.3*  MG  --  1.9  PHOS  --  2.6    Studies: No results found.  Scheduled Meds:  amiodarone  200 mg Oral Daily   calcium-vitamin D  1 tablet Oral Daily   carvedilol  12.5 mg Oral BID WC   gabapentin  300 mg  Oral TID   levothyroxine  112 mcg Oral QAC breakfast   magnesium oxide  200 mg Oral Daily   multivitamin with minerals  1 tablet Oral Daily   simvastatin  10 mg Oral QHS   Continuous Infusions: PRN Meds: acetaminophen, albuterol, dextromethorphan-guaiFENesin, fluticasone, hydrALAZINE, methocarbamol, morphine injection, ondansetron (ZOFRAN) IV, oxyCODONE-acetaminophen, pantoprazole  Time spent: 35 minutes  Author: 14/06/22. MD [facility] 05/27/2021 2:13 PM  To reach On-call, see care teams to locate the attending and reach out to them via [email or web address]. If 7PM-7AM, please contact night-coverage If you still have difficulty reaching the attending provider, please page the [facility] (Director on Call) for [facility] on amion for assistance.

## 2021-05-27 NOTE — Progress Notes (Signed)
Initial Nutrition Assessment  DOCUMENTATION CODES:   Not applicable  INTERVENTION:   -MVI with minerals daily -Ensure Enlive po BID, each supplement provides 350 kcal and 20 grams of protein  -Liberalize diet to regular  NUTRITION DIAGNOSIS:   Increased nutrient needs related to acute illness as evidenced by estimated needs.  GOAL:   Patient will meet greater than or equal to 90% of their needs  MONITOR:   PO intake, Supplement acceptance, Labs, Weight trends, Skin, I & O's  REASON FOR ASSESSMENT:   Malnutrition Screening Tool    ASSESSMENT:   Mackenzie Walsh is a 87 y.o. female with medical history significant of hypertension, hyperlipidemia, COPD on 2 L oxygen, GERD, hypothyroidism, atrial fibrillation on Xarelto, chronic pain, AICD, mitral valve regurgitation, migraine headaches, anemia, CKD-3A, dCHF, who presents with fall.  Pt admitted with fall and pubic ramus fracture.   Reviewed I/O's: +180 ml x 24 hours  Pt unavailable at time of visit. Pt receiving nursing care at time of visit. RD unable to obtain further nutrition-related history or complete nutrition-focused physical exam at this time.     Pt currently on a heart healthy diet. No meal completions currently available at this time.   Reviewed wt hx; pt has experienced a 4.4% wt loss over the past month, which is not significant for time frame.   Per TOC notes, plan to d/c back to [facility] [place] on Thursday (05/29/21) due to COVID isolation status.   Medications reviewed and include calcium with vitamin D and magnesium oxide.   Labs reviewed.   Diet Order:   Diet Order             Diet Heart Room service appropriate? Yes; Fluid consistency: Thin  Diet effective now                   EDUCATION NEEDS:   No education needs have been identified at this time  Skin:  Skin Assessment: Reviewed RN Assessment  Last BM:  05/23/21  Height:   Ht Readings from Last 1 Encounters:  05/26/21 5'  2" (1.575 m)    Weight:   Wt Readings from Last 1 Encounters:  05/27/21 49.9 kg    Ideal Body Weight:  50 kg  BMI:  Body mass index is 20.12 kg/m.  Estimated Nutritional Needs:   Kcal:  1500-1700  Protein:  75-90 grams  Fluid:  > 1.5 L    Levada Schilling, RD, LDN, CDCES Registered Dietitian II Certified Diabetes Care and Education Specialist Please refer to [facility] for RD and/or RD on-call/weekend/after hours pager

## 2021-05-27 NOTE — Progress Notes (Signed)
Physical Therapy Treatment Patient Details Name: Mackenzie Walsh MRN: [id number] DOB: [birth year] Today's Date: 05/27/2021   History of Present Illness Pt is an 87 y.o. female presenting to hospital 05/26/21 after a fall (pt dozed off and fell to the ground while at dining hall); c/o R hip pain and unable to bear weight.  CT scan of the right hip demonstrates an inferior and superior pubic ramus fracture with a small hematoma.   PMH includes a-fib, AICD, anemia, a-fib, broken femur 2017, chronic pain syndrome, chronic systolic CHF, CKD, COPD, htn, permanent cardiac pacemaker, scoliosis deformity of spine, cardiac surgery, CABG, and R TKR.    PT Comments    Pt sitting in recliner finishing breakfast upon PT arrival; pt and pt's daughter in law (present during session) had questions regarding pt's discharge to SNF (TOC notified).  Tolerated sitting LE ex's fairly well with assist for R LE.  Mod assist x1 first trial standing and min assist x1 2nd trial standing up to RW with vc's for overall technique.  In standing (using RW), pt able to take 5 steps in place with R LE (decreased foot clearance and increased effort/time to take steps) before requiring sitting rest break d/t fatigue/pain.  Then pt stood with assist and with max cueing for technique pt eventually able to take small step forward/back and then forward/back with L LE; cueing for upright posture and increasing UE support through walker to offweight R LE required.  Pain 2/10 R pelvis at rest beginning of session; 8-9/10 with activity; and 5/10 at rest end of session (pt declining pain meds).  Will continue to focus on strengthening and progressive functional mobility during hospitalization.   Recommendations for follow up therapy are one component of a multi-disciplinary discharge planning process, led by the attending physician.  Recommendations may be updated based on patient status, additional functional criteria and insurance  authorization.  Follow Up Recommendations  Skilled nursing-short term rehab (<3 hours/day)     Assistance Recommended at Discharge Frequent or constant Supervision/Assistance  Equipment Recommendations  Rolling walker (2 wheels);BSC/3in1    Recommendations for Other Services OT consult     Precautions / Restrictions Precautions Precautions: Fall Precaution Comments: Monitor BP Restrictions Weight Bearing Restrictions: Yes Other Position/Activity Restrictions: Per ED physician note 12/5: "Discussed with Dr. Allena Katz who recommends weightbearing as tolerated"     Mobility  Bed Mobility         General bed mobility comments: Deferred (pt in recliner beginning/end of session)    Transfers Overall transfer level: Needs assistance Equipment used: Rolling walker (2 wheels) Transfers: Sit to/from Stand Sit to Stand: Mod assist;Min assist         General transfer comment: mod assist 1st trial standing; min assist 2nd trial standing; vc's for scooting forward towards edge of chair prior to standing and also UE/LE placement and overall technique with standing    Ambulation/Gait Ambulation/Gait assistance: Min assist;Mod assist Gait Distance (Feet): 0 Feet Assistive device: Rolling walker (2 wheels)   Gait velocity: decreased     General Gait Details: pt able to take 5 steps in place with R LE (decreased foot clearance and increased effort/time to take steps); pt had sitting rest break d/t fatigue/pain; then with max cueing for technique pt eventually able to take step forward/back and then forward/back with L LE; cueing for upright posture and increasing UE support through walker to offweight R LE   Stairs  Wheelchair Mobility    Modified Rankin (Stroke Patients Only)       Balance Overall balance assessment: Needs assistance Sitting-balance support: No upper extremity supported;Feet supported Sitting balance-Leahy Scale: Good Sitting balance -  Comments: steady sitting reaching within BOS   Standing balance support: Bilateral upper extremity supported;During functional activity Standing balance-Leahy Scale: Fair Standing balance comment: pt requiring UE support through RW for static standing balance with initial vc's to shift weight forward d/t mild posterior lean                            Cognition Arousal/Alertness: Awake/alert Behavior During Therapy: WFL for tasks assessed/performed Overall Cognitive Status: Within Functional Limits for tasks assessed                                 General Comments: A&O x4        Exercises General Exercises - Lower Extremity Long Arc QuadBarbaraann Boys;Right;AROM;Left;Strengthening;10 reps;Seated Hip Flexion/Marching: AAROM;Right;AROM;Left;Strengthening;10 reps;Seated    General Comments        Pertinent Vitals/Pain Pain Assessment: Faces Pain Score: 5  Faces Pain Scale: Hurts even more Pain Location: R hip/pelvis Pain Descriptors / Indicators: Aching;Sore Pain Intervention(s): Limited activity within patient's tolerance;Monitored during session;Premedicated before session;Repositioned Vitals (HR and O2 on 2 L via nasal cannula) stable and WFL throughout treatment session.    Home Living           Prior Function            PT Goals (current goals can now be found in the care plan section) Acute Rehab PT Goals Patient Stated Goal: to improve mobility PT Goal Formulation: With patient Time For Goal Achievement: 06/09/21 Potential to Achieve Goals: Good Progress towards PT goals: Progressing toward goals    Frequency    7X/week      PT Plan Current plan remains appropriate    Co-evaluation              AM-PAC PT "6 Clicks" Mobility   Outcome Measure  Help needed turning from your back to your side while in a flat bed without using bedrails?: A Little Help needed moving from lying on your back to sitting on the side of a flat bed  without using bedrails?: A Lot Help needed moving to and from a bed to a chair (including a wheelchair)?: A Lot Help needed standing up from a chair using your arms (e.g., wheelchair or bedside chair)?: A Lot Help needed to walk in hospital room?: Total Help needed climbing 3-5 steps with a railing? : Total 6 Click Score: 11    End of Session Equipment Utilized During Treatment: Gait belt Activity Tolerance: Patient limited by pain Patient left: in chair;with call bell/phone within reach;with chair alarm set;with family/visitor present;Other (comment) (B heels floating via pillow support) Nurse Communication: Mobility status;Precautions PT Visit Diagnosis: Other abnormalities of gait and mobility (R26.89);Muscle weakness (generalized) (M62.81);History of falling (Z91.81);Pain Pain - Right/Left: Right Pain - part of body: Hip     Time: 3235-5732 PT Time Calculation (min) (ACUTE ONLY): 53 min  Charges:  $Gait Training: 8-22 mins $Therapeutic Exercise: 8-22 mins $Therapeutic Activity: 23-37 mins                    Hendricks Limes, PT 05/27/21, 2:12 PM

## 2021-05-28 DIAGNOSIS — I4891 Unspecified atrial fibrillation: Secondary | ICD-10-CM | POA: Diagnosis present

## 2021-05-28 DIAGNOSIS — Z95810 Presence of automatic (implantable) cardiac defibrillator: Secondary | ICD-10-CM | POA: Diagnosis not present

## 2021-05-28 DIAGNOSIS — J449 Chronic obstructive pulmonary disease, unspecified: Secondary | ICD-10-CM | POA: Diagnosis present

## 2021-05-28 DIAGNOSIS — Z881 Allergy status to other antibiotic agents status: Secondary | ICD-10-CM | POA: Diagnosis not present

## 2021-05-28 DIAGNOSIS — M810 Age-related osteoporosis without current pathological fracture: Secondary | ICD-10-CM | POA: Diagnosis present

## 2021-05-28 DIAGNOSIS — Z66 Do not resuscitate: Secondary | ICD-10-CM | POA: Diagnosis present

## 2021-05-28 DIAGNOSIS — Z91018 Allergy to other foods: Secondary | ICD-10-CM | POA: Diagnosis not present

## 2021-05-28 DIAGNOSIS — I10 Essential (primary) hypertension: Secondary | ICD-10-CM | POA: Diagnosis not present

## 2021-05-28 DIAGNOSIS — W19XXXA Unspecified fall, initial encounter: Secondary | ICD-10-CM | POA: Diagnosis present

## 2021-05-28 DIAGNOSIS — U071 COVID-19: Secondary | ICD-10-CM | POA: Diagnosis present

## 2021-05-28 DIAGNOSIS — K219 Gastro-esophageal reflux disease without esophagitis: Secondary | ICD-10-CM | POA: Diagnosis present

## 2021-05-28 DIAGNOSIS — E785 Hyperlipidemia, unspecified: Secondary | ICD-10-CM | POA: Diagnosis present

## 2021-05-28 DIAGNOSIS — N1831 Chronic kidney disease, stage 3a: Secondary | ICD-10-CM | POA: Diagnosis present

## 2021-05-28 DIAGNOSIS — E039 Hypothyroidism, unspecified: Secondary | ICD-10-CM | POA: Diagnosis present

## 2021-05-28 DIAGNOSIS — D539 Nutritional anemia, unspecified: Secondary | ICD-10-CM | POA: Diagnosis present

## 2021-05-28 DIAGNOSIS — I130 Hypertensive heart and chronic kidney disease with heart failure and stage 1 through stage 4 chronic kidney disease, or unspecified chronic kidney disease: Secondary | ICD-10-CM | POA: Diagnosis present

## 2021-05-28 DIAGNOSIS — G8929 Other chronic pain: Secondary | ICD-10-CM | POA: Diagnosis present

## 2021-05-28 DIAGNOSIS — E440 Moderate protein-calorie malnutrition: Secondary | ICD-10-CM | POA: Diagnosis present

## 2021-05-28 DIAGNOSIS — S32591A Other specified fracture of right pubis, initial encounter for closed fracture: Secondary | ICD-10-CM | POA: Diagnosis present

## 2021-05-28 DIAGNOSIS — Z6820 Body mass index (BMI) 20.0-20.9, adult: Secondary | ICD-10-CM | POA: Diagnosis not present

## 2021-05-28 DIAGNOSIS — Z888 Allergy status to other drugs, medicaments and biological substances status: Secondary | ICD-10-CM | POA: Diagnosis not present

## 2021-05-28 DIAGNOSIS — Z96651 Presence of right artificial knee joint: Secondary | ICD-10-CM | POA: Diagnosis present

## 2021-05-28 DIAGNOSIS — I340 Nonrheumatic mitral (valve) insufficiency: Secondary | ICD-10-CM | POA: Diagnosis present

## 2021-05-28 DIAGNOSIS — I5032 Chronic diastolic (congestive) heart failure: Secondary | ICD-10-CM | POA: Diagnosis present

## 2021-05-28 DIAGNOSIS — Z951 Presence of aortocoronary bypass graft: Secondary | ICD-10-CM | POA: Diagnosis not present

## 2021-05-28 LAB — PHOSPHORUS: Phosphorus: 2.2 mg/dL — ABNORMAL LOW (ref 2.5–4.6)

## 2021-05-28 LAB — CBC
HCT: 32 % — ABNORMAL LOW (ref 36.0–46.0)
Hemoglobin: 10 g/dL — ABNORMAL LOW (ref 12.0–15.0)
MCH: 34.1 pg — ABNORMAL HIGH (ref 26.0–34.0)
MCHC: 31.3 g/dL (ref 30.0–36.0)
MCV: 109.2 fL — ABNORMAL HIGH (ref 80.0–100.0)
Platelets: 145 10*3/uL — ABNORMAL LOW (ref 150–400)
RBC: 2.93 MIL/uL — ABNORMAL LOW (ref 3.87–5.11)
RDW: 13.7 % (ref 11.5–15.5)
WBC: 5.2 10*3/uL (ref 4.0–10.5)
nRBC: 0 % (ref 0.0–0.2)

## 2021-05-28 LAB — BASIC METABOLIC PANEL
Anion gap: 3 — ABNORMAL LOW (ref 5–15)
BUN: 28 mg/dL — ABNORMAL HIGH (ref 8–23)
CO2: 30 mmol/L (ref 22–32)
Calcium: 8.2 mg/dL — ABNORMAL LOW (ref 8.9–10.3)
Chloride: 103 mmol/L (ref 98–111)
Creatinine, Ser: 0.87 mg/dL (ref 0.44–1.00)
GFR, Estimated: >60 mL/min (ref >60)
Glucose, Bld: 92 mg/dL (ref 70–99)
Potassium: 4 mmol/L (ref 3.5–5.1)
Sodium: 136 mmol/L (ref 135–145)

## 2021-05-28 LAB — MAGNESIUM: Magnesium: 2 mg/dL (ref 1.7–2.4)

## 2021-05-28 MED ORDER — K PHOS MONO-SOD PHOS DI & MONO 155-852-130 MG PO TABS
500.0000 mg | ORAL_TABLET | Freq: Three times a day (TID) | ORAL | Status: DC
  Administered 2021-05-28 – 2021-05-29 (×4): 500 mg via ORAL
  Filled 2021-05-28 (×6): qty 2

## 2021-05-28 NOTE — Progress Notes (Signed)
[facility] Progress Note  Patient: Mackenzie Walsh    WUJ:[id number]  DOA: 05/26/2021     Date of Service: the patient was seen and examined on 05/28/2021  Chief Complaint  Patient presents with   Fall   Brief hospital course: Mackenzie Walsh is a 87 y.o. female with medical history significant of hypertension, hyperlipidemia, COPD on 2 L oxygen, GERD, hypothyroidism, atrial fibrillation on Xarelto, chronic pain, AICD, mitral valve regurgitation, migraine headaches, anemia, CKD-3A, dCHF, who presents with fall. pt fell asleep at the table and slid on the floor. C/o right hip pain, not sure about LOC.  ED work-up patient was found to have COVID-positive and Chest x-ray negative.  CT of head and neck negative for acute injury.  CT of right hip showed pubic rami fracture.   CT-right hip 1. Acute minimally displaced fracture involving the parasymphyseal aspect of the right pubic bone extending into the right superior pubic ramus. 2. Additional nondisplaced fracture involving the midportion of the right inferior pubic ramus. 3. Small focal hematoma along the superior margin of the right superior pubic ramus fracture site measuring 3.1 x 1.9 x 2.3 cm.    Assessment and Plan: Principal Problem:   Fall Active Problems:   Chronic diastolic CHF (congestive heart failure) (HCC)   A-fib (HCC)   COPD (chronic obstructive pulmonary disease) (HCC)   Hyperlipidemia   Hypertension   Hypothyroidism   CKD (chronic kidney disease), stage IIIa   Macrocytic anemia   Pubic ramus fracture (HCC)   COVID-19 virus infection     Fall and pubic ramus fracture: ED physician discussed with Dr. Allena Katz of orthopedic surgery.  He recommended weightbearing as tolerated.  Patient is from independent living facility, patient may need to go back to the same facility, but to SNF unit Hematoma due to fall, apply ice pack to stop bleeding was advised to patient Continue Fall precaution -PT/OT -As needed  Tylenol, Percocet, morphine for pain -As needed Robaxin for muscle spasm -TOC consult Patient cannot go to SNF until Thursday due to COVID as per social worker   Chronic diastolic CHF (congestive heart failure) (HCC): 2D echo on 11/28/2020 showed EF > 55%.  Patient does not have leg edema or JVD.  CHF seem to be compensated.  Blood pressures are soft, will hold diuretics. -Hold Lasix and spironolactone -Check BNP   A-fib (HCC) -Hold Xarelto due to hematoma -Continue Coreg, amiodarone   COPD (chronic obstructive pulmonary disease) (HCC):: Stable, uses oxygen at nighttime -Bronchodilators   Hyperlipidemia -Zocor   Hypertension: Blood pressure soft -Continue Coreg -Hold Cozaar, Lasix, spironolactone   Hypothyroidism -Synthroid   CKD (chronic kidney disease), stage IIIa: Stable -Follow-up with BMP   Macrocytic anemia: Hemoglobin 10.5 Iron and folate within normal range -Follow-up with CBC   COVID-19 virus infection: Patient has minimal symptoms -As needed Mucinex, albuterol Saturating well on room air  Hypophosphatemia, phos repleted.  Body mass index is 20.12 kg/m.  Interventions:       Diet: Heart healthy DVT Prophylaxis: SCD, pharmacological prophylaxis contraindicated due to pelvic hematoma due to fall    Advance goals of care discussion: DNR  Family Communication: family was present at bedside, at the time of interview.  The pt provided permission to discuss medical plan with the family. Opportunity was given to ask question and all questions were answered satisfactorily.   Disposition:  Pt is from ALF, admitted with Fall and pelvic fracture, COVID viral infection, still has pain, COVID-positive, which precludes a  safe discharge. Discharge to SNF, when SNF will take the patient most likely on Thursday as per TOC due to COVID.  Subjective: No significant overnight events, patient pain is improving but is still has pain 4/10, denies any worsening of shortness of  breath, no any other COVID symptoms.  Denied any chest pain palpitations.   Physical Exam: General:  alert oriented to time, place, and person.  Appear in mild distress, affect appropriate Eyes: PERRLA ENT: Oral Mucosa Clear, moist  Neck: no JVD,  Cardiovascular: S1 and S2 Present, no Murmur,  Respiratory: good respiratory effort, Bilateral Air entry equal and Decreased, no Crackles, no wheezes Abdomen: Bowel Sound present, Soft and no tenderness,  Skin: no rashes Extremities: no Pedal edema, no calf tenderness Neurologic: without any new focal findings Gait not checked due to patient safety concerns  Vitals:   05/27/21 2157 05/28/21 0602 05/28/21 0756 05/28/21 1149  BP: 105/77 105/70 109/72 91/63  Pulse: 79 80 80 81  Resp: 20 16 17 15   Temp: 97.8 F (36.6 C) 97.8 F (36.6 C) 98.7 F (37.1 C) (!) 97.5 F (36.4 C)  TempSrc:      SpO2: 96% 98% 98% 95%  Weight:      Height:        Intake/Output Summary (Last 24 hours) at 05/28/2021 1402 Last data filed at 05/28/2021 1151 Gross per 24 hour  Intake 240 ml  Output 1100 ml  Net -860 ml   Filed Weights   05/26/21 0954 05/27/21 0500  Weight: 49.9 kg 49.9 kg    Data Reviewed: I have personally reviewed and interpreted daily labs, tele strips, imagings as discussed above. I reviewed all nursing notes, pharmacy notes, vitals, pertinent old records I have discussed plan of care as described above with RN and patient/family.  CBC: Recent Labs  Lab 05/26/21 1030 05/27/21 0432 05/28/21 0207  WBC 8.0 5.3 5.2  HGB 10.5* 9.8* 10.0*  HCT 33.0* 31.3* 32.0*  MCV 110.0* 108.7* 109.2*  PLT 163 149* [id number]*   Basic Metabolic Panel: Recent Labs  Lab 05/26/21 1030 05/27/21 0847 05/28/21 0207  NA 135 136 136  K 4.1 4.2 4.0  CL 103 102 103  CO2 27 30 30   GLUCOSE 93 109* 92  BUN 35* 27* 28*  CREATININE 1.13* 0.95 0.87  CALCIUM 8.5* 8.3* 8.2*  MG  --  1.9 2.0  PHOS  --  2.6 2.2*    Studies: No results found.  Scheduled  Meds:  amiodarone  200 mg Oral Daily   calcium-vitamin D  1 tablet Oral Daily   carvedilol  12.5 mg Oral BID WC   feeding supplement  237 mL Oral BID BM   gabapentin  300 mg Oral TID   levothyroxine  112 mcg Oral QAC breakfast   magnesium oxide  200 mg Oral Daily   multivitamin with minerals  1 tablet Oral Daily   phosphorus  500 mg Oral TID   simvastatin  10 mg Oral QHS   Continuous Infusions: PRN Meds: acetaminophen, albuterol, dextromethorphan-guaiFENesin, fluticasone, hydrALAZINE, methocarbamol, morphine injection, ondansetron (ZOFRAN) IV, oxyCODONE-acetaminophen, pantoprazole  Time spent: 35 minutes  Author: Val Riles. MD [facility] 05/28/2021 2:02 PM  To reach On-call, see care teams to locate the attending and reach out to them via [email or web address]. If 7PM-7AM, please contact night-coverage If you still have difficulty reaching the attending provider, please page the [facility] (Director on Call) for [facility] on amion for assistance.

## 2021-05-28 NOTE — Progress Notes (Signed)
Occupational Therapy Treatment Patient Details Name: Mackenzie Walsh MRN: [id number] DOB: 03-Mar-1932 Today's Date: 05/28/2021   History of present illness Pt is an 87 y.o. female presenting to hospital 05/26/21 after a fall (pt dozed off and fell to the ground while at dining hall); c/o R hip pain and unable to bear weight.  CT scan of the right hip demonstrates an inferior and superior pubic ramus fracture with a small hematoma.   PMH includes a-fib, AICD, anemia, a-fib, broken femur 2017, chronic pain syndrome, chronic systolic CHF, CKD, COPD, htn, permanent cardiac pacemaker, scoliosis deformity of spine, cardiac surgery, CABG, and R TKR.   OT comments  Pt seen for OT tx this date to f/u re: safety with ADLs/ADL mobility. Pt requires MIN/MOD A for STS And tolerates 3 trials for ADL participation. OT engages pt in seated UB bathing with MIN A and dressing with SETUP. She requires MOD A for posterior peri care in standing d/t limited tolerance for more dynamic tasks 2/2 pain. Pt returned to chair eng of session with all needs met and in reach. Will continue to follow acutely.    Recommendations for follow up therapy are one component of a multi-disciplinary discharge planning process, led by the attending physician.  Recommendations may be updated based on patient status, additional functional criteria and insurance authorization.    Follow Up Recommendations  Skilled nursing-short term rehab (<3 hours/day)    Assistance Recommended at Discharge Frequent or constant Supervision/Assistance  Equipment Recommendations  Other (comment) (defer to next venue of care)    Recommendations for Other Services      Precautions / Restrictions Precautions Precautions: Fall Precaution Comments: Monitor BP Restrictions Weight Bearing Restrictions: Yes RLE Weight Bearing: Weight bearing as tolerated LLE Weight Bearing: Weight bearing as tolerated Other Position/Activity Restrictions: Per ED physician  note 12/5: "Discussed with Dr. Posey Pronto who recommends weightbearing as tolerated"       Mobility Bed Mobility Overal bed mobility: Needs Assistance Bed Mobility: Supine to Sit     Supine to sit: Mod assist;HOB elevated     General bed mobility comments: up to chair pre/post session    Transfers Overall transfer level: Needs assistance Equipment used: Rolling walker (2 wheels) Transfers: Sit to/from Stand Sit to Stand: Min assist;Mod assist           General transfer comment: 3 standing trials from recliner to participate in LB ADLs including bathing and dressing.     Balance Overall balance assessment: Needs assistance Sitting-balance support: No upper extremity supported;Feet supported Sitting balance-Leahy Scale: Good Sitting balance - Comments: G static   Standing balance support: Bilateral upper extremity supported;During functional activity;Reliant on assistive device for balance Standing balance-Leahy Scale: Fair Standing balance comment: UE support with SUPV/CGA for static standing, MIN A For dynamic standing tasks such as posterior peri care, primarily d/t limited tolerance for R hip/knee flexion 2/2 pain                           ADL either performed or assessed with clinical judgement   ADL Overall ADL's : Needs assistance/impaired     Grooming: Wash/dry face;Set up;Sitting   Upper Body Bathing: Minimal assistance;Sitting   Lower Body Bathing: Minimal assistance;Moderate assistance;Sit to/from stand Lower Body Bathing Details (indicate cue type and reason): MIN/MOD A for posterior LB bathign in standing, pt able to help with anterior with CGA for standing balance with RW Upper Body Dressing : Set up;Sitting  Lower Body Dressing: Moderate assistance;Sit to/from stand Lower Body Dressing Details (indicate cue type and reason): cltohing mgt over hips                    Extremity/Trunk Assessment              Vision        Perception     Praxis      Cognition Arousal/Alertness: Awake/alert Behavior During Therapy: WFL for tasks assessed/performed Overall Cognitive Status: Within Functional Limits for tasks assessed                                 General Comments: A&O x4          Exercises Other Exercises Other Exercises: engaged pt in bathing/dressing/grooming tasks.   Shoulder Instructions       General Comments Pt's breakfast tray arrived during session. Author recommended pt perform ther ex handout to imporve strength after eating.    Pertinent Vitals/ Pain       Pain Assessment: Faces Pain Score: 3  Faces Pain Scale: Hurts a little bit Pain Location: R hip/pelvis Pain Descriptors / Indicators: Aching;Sore Pain Intervention(s): Limited activity within patient's tolerance;Monitored during session;Repositioned  Home Living                                          Prior Functioning/Environment              Frequency  Min 3X/week        Progress Toward Goals  OT Goals(current goals can now be found in the care plan section)  Progress towards OT goals: Progressing toward goals  Acute Rehab OT Goals Patient Stated Goal: to go to rehab OT Goal Formulation: With patient/family Time For Goal Achievement: 06/10/21 Potential to Achieve Goals: Good  Plan Discharge plan remains appropriate    Co-evaluation                 AM-PAC OT "6 Clicks" Daily Activity     Outcome Measure   Help from another person eating meals?: None Help from another person taking care of personal grooming?: A Little Help from another person toileting, which includes using toliet, bedpan, or urinal?: A Lot Help from another person bathing (including washing, rinsing, drying)?: A Lot Help from another person to put on and taking off regular upper body clothing?: A Little Help from another person to put on and taking off regular lower body clothing?: A Lot 6  Click Score: 16    End of Session Equipment Utilized During Treatment: Gait belt;Rolling walker (2 wheels)  OT Visit Diagnosis: Unsteadiness on feet (R26.81);Other abnormalities of gait and mobility (R26.89);History of falling (Z91.81)   Activity Tolerance Patient limited by pain   Patient Left in chair;with call bell/phone within reach;with chair alarm set   Nurse Communication Mobility status        Time: 2919-1660 OT Time Calculation (min): 29 min  Charges: OT General Charges $OT Visit: 1 Visit OT Treatments $Self Care/Home Management : 8-22 mins $Therapeutic Activity: 8-22 mins  Gerrianne Scale, MS, OTR/L ascom [phone] 05/28/21, 3:10 PM

## 2021-05-28 NOTE — Progress Notes (Signed)
Nutrition Follow-up  DOCUMENTATION CODES:   Non-severe (moderate) malnutrition in context of chronic illness  INTERVENTION:   -Continue MVI with minerals daily -Continue Ensure Enlive po BID, each supplement provides 350 kcal and 20 grams of protein  -Continue gluten free diet    NUTRITION DIAGNOSIS:   Moderate Malnutrition related to chronic illness (COPD) as evidenced by mild fat depletion, moderate fat depletion, mild muscle depletion, moderate muscle depletion.  Ongoing  GOAL:   Patient will meet greater than or equal to 90% of their needs  Progressing   MONITOR:   PO intake, Supplement acceptance, Labs, Weight trends, Skin, I & O's  REASON FOR ASSESSMENT:   Malnutrition Screening Tool    ASSESSMENT:   Mackenzie Walsh is a 87 y.o. female with medical history significant of hypertension, hyperlipidemia, COPD on 2 L oxygen, GERD, hypothyroidism, atrial fibrillation on Xarelto, chronic pain, AICD, mitral valve regurgitation, migraine headaches, anemia, CKD-3A, dCHF, who presents with fall.  Reviewed I/O's: -560 ml x 24 hours and -380 ml since admission  UOP: 800 ml x 24 hours   Spoke with pt at bedside, who reports feeling a little better today. She reports she did not eat well yesterday, but "made up for it" at breakfast. Pt consumed 90% of breakfast meals. Pt confirms that she has followed a gluten free diet over the past 10 years secondary to celiac disease.  PTA pt consumed 3 meals per day (Breakfast: eggs, cereal, milk, and coffee; Lunch: sandwich; Dinner: salads or meat, starch, and vegetable). Family often provides evening meal. Pt reports she has access to the dining room but often does not partake secondary to dietary restrictions. Intake has been less over the past month as pt's husband was moved to a memory care facility and pt has had difficulty with this transition.   Pt reports her UBW is around 120#. She estimates she has lost about 5-8# over the past 2  months. Reviewed wt hx; pt has experienced a 4.4% wt loss over the past month, which is not significant for time frame.   Discussed importance of good meal and supplement intake to promote healing. Pt amenable to Ensure.   Labs reviewed: Phos: 2.2.   NUTRITION - FOCUSED PHYSICAL EXAM:  Flowsheet Row Most Recent Value  Orbital Region Mild depletion  Upper Arm Region Mild depletion  Thoracic and Lumbar Region No depletion  Buccal Region Mild depletion  Temple Region Moderate depletion  Clavicle Bone Region Moderate depletion  Clavicle and Acromion Bone Region Moderate depletion  Scapular Bone Region Moderate depletion  Dorsal Hand Mild depletion  Patellar Region Mild depletion  Anterior Thigh Region Mild depletion  Posterior Calf Region Mild depletion  Edema (RD Assessment) None  Hair Reviewed  Eyes Reviewed  Mouth Reviewed  Skin Reviewed  Nails Reviewed       Diet Order:   Diet Order             Diet gluten free Room service appropriate? Yes; Fluid consistency: Thin  Diet effective now                   EDUCATION NEEDS:   Education needs have been addressed  Skin:  Skin Assessment: Reviewed RN Assessment  Last BM:  05/23/21  Height:   Ht Readings from Last 1 Encounters:  05/26/21 5\' 2"  (1.575 m)    Weight:   Wt Readings from Last 1 Encounters:  05/27/21 49.9 kg    Ideal Body Weight:  50 kg  BMI:  Body mass index is 20.12 kg/m.  Estimated Nutritional Needs:   Kcal:  1500-1700  Protein:  75-90 grams  Fluid:  > 1.5 L    Levada Schilling, RD, LDN, CDCES Registered Dietitian II Certified Diabetes Care and Education Specialist Please refer to [facility] for RD and/or RD on-call/weekend/after hours pager

## 2021-05-28 NOTE — Progress Notes (Signed)
Physical Therapy Treatment Patient Details Name: Mackenzie Walsh MRN: [id number] DOB: 23-Aug-1931 Today's Date: 05/28/2021   History of Present Illness Pt is an 87 y.o. female presenting to hospital 05/26/21 after a fall (pt dozed off and fell to the ground while at dining hall); c/o R hip pain and unable to bear weight.  CT scan of the right hip demonstrates an inferior and superior pubic ramus fracture with a small hematoma.   PMH includes a-fib, AICD, anemia, a-fib, broken femur 2017, chronic pain syndrome, chronic systolic CHF, CKD, COPD, htn, permanent cardiac pacemaker, scoliosis deformity of spine, cardiac surgery, CABG, and R TKR.    PT Comments    Pt was long sitting in bed upon arriving. She required a little encouragement but once agreeable does fully participate. Pt reports no pain at rest that elevated with movements and wt bearing. She continues to require a lot of assistance to achieve EOB short sit with increased time and mod assist. Stood EOB 2 x prior to ambulating several ft to recliner. Pt does fatigue quickly and does c/o increased pain in wt bearing. Pt's breakfast tray arrived  during session. Author highly recommends DC to SNF to address deficits while maximizing independence with ADLs.    Recommendations for follow up therapy are one component of a multi-disciplinary discharge planning process, led by the attending physician.  Recommendations may be updated based on patient status, additional functional criteria and insurance authorization.  Follow Up Recommendations  Skilled nursing-short term rehab (<3 hours/day)     Assistance Recommended at Discharge Frequent or constant Supervision/Assistance  Equipment Recommendations  Rolling walker (2 wheels);BSC/3in1       Precautions / Restrictions Precautions Precautions: Fall Precaution Comments: Monitor BP Restrictions Weight Bearing Restrictions: Yes RLE Weight Bearing: Weight bearing as tolerated LLE Weight  Bearing: Weight bearing as tolerated     Mobility  Bed Mobility Overal bed mobility: Needs Assistance Bed Mobility: Supine to Sit     Supine to sit: Mod assist;HOB elevated     General bed mobility comments: pt required mod assist Z+ increased time to exit L sid eof bed. pain limiting more so than strength limiting.    Transfers Overall transfer level: Needs assistance Equipment used: Rolling walker (2 wheels) Transfers: Sit to/from Stand Sit to Stand: Min assist;Mod assist           General transfer comment: Pt stood 2 x EOB prior to ambulating to recliner    Ambulation/Gait Ambulation/Gait assistance: Min assist;Mod assist Gait Distance (Feet): 3 Feet Assistive device: Rolling walker (2 wheels) Gait Pattern/deviations: Step-to pattern;Ataxic;Trunk flexed;Narrow base of support;Decreased stride length Gait velocity: decreased     General Gait Details: Pt was able to ambulate from EOB to recliner ~ 3 ft with pediatric/youth RW. Antalgic, step to pattern with poor standing posture throughout. Cnstant vcs for posture correction and to improve lateral wt shift to allow opposite LE advancent.      Balance Overall balance assessment: Needs assistance Sitting-balance support: No upper extremity supported;Feet supported Sitting balance-Leahy Scale: Good     Standing balance support: Bilateral upper extremity supported;During functional activity;Reliant on assistive device for balance Standing balance-Leahy Scale: Fair      Cognition Arousal/Alertness: Awake/alert Behavior During Therapy: WFL for tasks assessed/performed Overall Cognitive Status: Within Functional Limits for tasks assessed    General Comments: A&O x4           General Comments General comments (skin integrity, edema, etc.): Pt's breakfast tray arrived during session. Author recommended  pt perform ther ex handout to imporve strength after eating.      Pertinent Vitals/Pain Pain Assessment:  0-10 Pain Score: 3  Faces Pain Scale: Hurts a little bit Pain Location: R hip/pelvis Pain Descriptors / Indicators: Aching;Sore Pain Intervention(s): Limited activity within patient's tolerance;Monitored during session;Premedicated before session;Repositioned     PT Goals (current goals can now be found in the care plan section) Acute Rehab PT Goals Patient Stated Goal: none stated Progress towards PT goals: Progressing toward goals    Frequency    7X/week      PT Plan Current plan remains appropriate       AM-PAC PT "6 Clicks" Mobility   Outcome Measure  Help needed turning from your back to your side while in a flat bed without using bedrails?: A Little Help needed moving from lying on your back to sitting on the side of a flat bed without using bedrails?: A Little Help needed moving to and from a bed to a chair (including a wheelchair)?: A Lot Help needed standing up from a chair using your arms (e.g., wheelchair or bedside chair)?: A Lot Help needed to walk in hospital room?: A Lot Help needed climbing 3-5 steps with a railing? : A Lot 6 Click Score: 14    End of Session Equipment Utilized During Treatment: Gait belt Activity Tolerance: Patient limited by pain;Patient tolerated treatment well Patient left: in chair;with call bell/phone within reach;with chair alarm set;with family/visitor present;Other (comment) Nurse Communication: Mobility status;Precautions PT Visit Diagnosis: Other abnormalities of gait and mobility (R26.89);Muscle weakness (generalized) (M62.81);History of falling (Z91.81);Pain Pain - Right/Left: Right Pain - part of body: Hip     Time: 0626-9485 PT Time Calculation (min) (ACUTE ONLY): 24 min  Charges:  $Gait Training: 8-22 mins $Therapeutic Activity: 8-22 mins                    Jetta Lout PTA 05/28/21, 1:37 PM

## 2021-05-29 DIAGNOSIS — E440 Moderate protein-calorie malnutrition: Secondary | ICD-10-CM | POA: Insufficient documentation

## 2021-05-29 LAB — BASIC METABOLIC PANEL
Anion gap: 6 (ref 5–15)
BUN: 24 mg/dL — ABNORMAL HIGH (ref 8–23)
CO2: 32 mmol/L (ref 22–32)
Calcium: 8.2 mg/dL — ABNORMAL LOW (ref 8.9–10.3)
Chloride: 101 mmol/L (ref 98–111)
Creatinine, Ser: 0.82 mg/dL (ref 0.44–1.00)
GFR, Estimated: >60 mL/min (ref >60)
Glucose, Bld: 86 mg/dL (ref 70–99)
Potassium: 4 mmol/L (ref 3.5–5.1)
Sodium: 139 mmol/L (ref 135–145)

## 2021-05-29 LAB — CBC
HCT: 31.1 % — ABNORMAL LOW (ref 36.0–46.0)
Hemoglobin: 9.5 g/dL — ABNORMAL LOW (ref 12.0–15.0)
MCH: 33.7 pg (ref 26.0–34.0)
MCHC: 30.5 g/dL (ref 30.0–36.0)
MCV: 110.3 fL — ABNORMAL HIGH (ref 80.0–100.0)
Platelets: 141 10*3/uL — ABNORMAL LOW (ref 150–400)
RBC: 2.82 MIL/uL — ABNORMAL LOW (ref 3.87–5.11)
RDW: 13.5 % (ref 11.5–15.5)
WBC: 5.3 10*3/uL (ref 4.0–10.5)
nRBC: 0 % (ref 0.0–0.2)

## 2021-05-29 LAB — PHOSPHORUS: Phosphorus: 3.8 mg/dL (ref 2.5–4.6)

## 2021-05-29 LAB — MAGNESIUM: Magnesium: 2 mg/dL (ref 1.7–2.4)

## 2021-05-29 NOTE — NC FL2 (Signed)
[place] LEVEL OF CARE SCREENING TOOL     IDENTIFICATION  Patient Name: Mackenzie Walsh Birthdate: Dec 16, 1931 Sex: female Admission Date (Current Location): 05/26/2021  [facility] and [place] Number:  Engineering geologist and Address:  [facility], [address], [place], [place] [address]      Provider Number: [id number]  Attending Physician Name and Address:  Val Riles, MD  Relative Name and Phone Number:  Ladona Ridgel (Daughter)   [phone] [facility])    Current Level of Care: Hospital Recommended Level of Care: [place] Prior Approval Number:    Date Approved/Denied:   PASRR Number: [id number]  Discharge Plan: SNF    Current Diagnoses: Patient Active Problem List   Diagnosis Date Noted   Malnutrition of moderate degree 05/29/2021   Closed fracture of multiple pubic rami, right, initial encounter ([place]) 05/28/2021   Fall 05/26/2021   A-fib ([place])    COPD (chronic obstructive pulmonary disease) ([place])    Hyperlipidemia    Hypertension    Hypothyroidism    CKD (chronic kidney disease), stage IIIa    Macrocytic anemia    Pubic ramus fracture ([place])    COVID-19 virus infection    Normocytic anemia 12/09/2020   Anemia in stage 3b chronic kidney disease ([place]) 12/09/2020   Weight loss 12/09/2020   Iron deficiency anemia 12/09/2020   Abnormal SPEP 05/28/2020   Macrocytosis 05/28/2020   COPD with chronic bronchitis ([place]) 01/16/2020   Chronic diastolic CHF (congestive heart failure) ([place]) 01/16/2020   Chronic anticoagulation 01/16/2020   Acute respiratory failure with hypoxia ([place]) 01/16/2020   Fall at home, initial encounter 01/16/2020   Compression fracture of thoracic spine, non-traumatic ([place]) 01/16/2020   Hypoxia 01/16/2020    Orientation RESPIRATION BLADDER Height & Weight     Self, Time, Situation  O2 (nasal cannula 2 l) Incontinent Weight: 49.9 kg Height:  5\' 2"  (157.5 cm)  BEHAVIORAL  SYMPTOMS/MOOD NEUROLOGICAL BOWEL NUTRITION STATUS        Diet (gluten free)  AMBULATORY STATUS COMMUNICATION OF NEEDS Skin   Limited Assist (ambulated 30 feet moderate assist x2) Verbally Normal                       Personal Care Assistance Level of Assistance  Bathing, Feeding, Dressing Bathing Assistance: Limited assistance Feeding assistance: Limited assistance Dressing Assistance: Limited assistance     Functional Limitations Info  Sight, Hearing, Speech Sight Info: Impaired Hearing Info: Impaired Speech Info: Adequate    SPECIAL CARE FACTORS FREQUENCY  PT (By licensed PT), OT (By licensed OT)     PT Frequency: 5x weekly OT Frequency: 5x weekly            Contractures Contractures Info: Not present    Additional Factors Info  Code Status, Allergies Code Status Info: DNR Allergies Info: Erythromycin  Gluten Meal  Celiac Disease           Current Medications (05/29/2021):  This is the current hospital active medication list Current Facility-Administered Medications  Medication Dose Route Frequency Provider Last Rate Last Admin   acetaminophen (TYLENOL) tablet 650 mg  650 mg Oral Q6H PRN Ivor Costa, MD   650 mg at 05/27/21 2155   albuterol (VENTOLIN HFA) 108 (90 Base) MCG/ACT inhaler 2 puff  2 puff Inhalation Q4H PRN Ivor Costa, MD       amiodarone (PACERONE) tablet 200 mg  200 mg Oral Daily Ivor Costa, MD   200 mg at  05/29/21 0855   calcium-vitamin D (OSCAL WITH D) 500-5 MG-MCG per tablet 1 tablet  1 tablet Oral Daily Lorretta Harp, MD   1 tablet at 05/29/21 0855   carvedilol (COREG) tablet 12.5 mg  12.5 mg Oral BID WC Gillis Santa, MD   12.5 mg at 05/27/21 1631   dextromethorphan-guaiFENesin (MUCINEX DM) 30-600 MG per 12 hr tablet 1 tablet  1 tablet Oral BID PRN Lorretta Harp, MD       feeding supplement (ENSURE ENLIVE / ENSURE PLUS) liquid 237 mL  237 mL Oral BID BM Gillis Santa, MD   237 mL at 05/29/21 0857   fluticasone (FLONASE) 50 MCG/ACT nasal spray 1-2  spray  1-2 spray Each Nare Daily PRN Lorretta Harp, MD       gabapentin (NEURONTIN) capsule 300 mg  300 mg Oral TID Lorretta Harp, MD   300 mg at 05/29/21 1950   hydrALAZINE (APRESOLINE) injection 5 mg  5 mg Intravenous Q2H PRN Lorretta Harp, MD       levothyroxine (SYNTHROID) tablet 112 mcg  112 mcg Oral QAC breakfast Lorretta Harp, MD   112 mcg at 05/29/21 0551   magnesium oxide (MAG-OX) tablet 200 mg  200 mg Oral Daily Lorretta Harp, MD   200 mg at 05/29/21 0855   methocarbamol (ROBAXIN) tablet 500 mg  500 mg Oral Q8H PRN Lorretta Harp, MD   500 mg at 05/27/21 2154   morphine 2 MG/ML injection 0.5 mg  0.5 mg Intravenous Q3H PRN Lorretta Harp, MD       multivitamin with minerals tablet 1 tablet  1 tablet Oral Daily Lorretta Harp, MD   1 tablet at 05/29/21 0855   ondansetron (ZOFRAN) injection 4 mg  4 mg Intravenous Q8H PRN Lorretta Harp, MD       oxyCODONE-acetaminophen (PERCOCET/ROXICET) 5-325 MG per tablet 1 tablet  1 tablet Oral Q4H PRN Lorretta Harp, MD   1 tablet at 05/29/21 0855   pantoprazole (PROTONIX) EC tablet 40 mg  40 mg Oral Daily PRN Lorretta Harp, MD       phosphorus (K PHOS NEUTRAL) tablet 500 mg  500 mg Oral TID Gillis Santa, MD   500 mg at 05/29/21 0855   simvastatin (ZOCOR) tablet 10 mg  10 mg Oral QHS Lorretta Harp, MD   10 mg at 05/28/21 2135     Discharge Medications: Please see discharge summary for a list of discharge medications.  Relevant Imaging Results:  Relevant Lab Results:   Additional Information COVID + facility is aware SSN [id number]  Caryn Section, RN

## 2021-05-29 NOTE — Progress Notes (Signed)
Vitals entered manually ° °

## 2021-05-29 NOTE — Progress Notes (Signed)
Report was called to Cammy Copa, Charity fundraiser at [facility]. All questions were answered. EMS transportation is being set up by RN CM. Waiting for discharge orders.

## 2021-05-29 NOTE — TOC Progression Note (Signed)
Transition of Care [facility]) - Progression Note    Patient Details  Name: Mackenzie Walsh MRN: [id number] Date of Birth: [birth year]  [facility] of [facility]) CM/SW Contact  Caryn Section, RN Phone Number: 05/29/2021, 9:30 AM  Clinical Narrative:   Patient can be discharged to SNF at [facility] today as per Doristine Counter.  Patient will go to room 340 at facility.  Care team, including MD aware.  EMS to transport patient    Expected Discharge Plan: Skilled Nursing Facility    Expected Discharge Plan and Services Expected Discharge Plan: Skilled Nursing Facility   Discharge Planning Services: CM Consult Post Acute Care Choice: Skilled Nursing Facility Living arrangements for the past 2 months: Assisted Living Facility                                       Social Determinants of Health (SDOH) Interventions    Readmission Risk Interventions No flowsheet data found.

## 2021-05-29 NOTE — Discharge Summary (Signed)
[facility] Discharge Summary   Patient: Mackenzie Walsh [id number]  PCP: Leonel Ramsay, MD  Date of admission: 05/26/2021   Date of discharge:  05/29/2021     Discharge Diagnoses:   Principal Problem:   Fall and right pubic rami fracture with hematoma Active Problems:   Chronic diastolic CHF (congestive heart failure) (HCC)   A-fib (HCC)   COPD (chronic obstructive pulmonary disease) (HCC)   Hyperlipidemia   Hypertension   Hypothyroidism   CKD (chronic kidney disease), stage IIIa   Macrocytic anemia   Pubic ramus fracture ([place])   COVID-19 virus infection   Closed fracture of multiple pubic rami, right, initial encounter ([place])   Malnutrition of moderate degree   Admitted From: ALF Disposition:  SNF   Recommendations for Outpatient Follow-up:  PCP: in 1 wk Orthopedics in 1 to 2 weeks Follow up LABS/TEST: CBC after 1 week   Diet recommendation: Cardiac diet  Activity: The patient is advised to gradually reintroduce usual activities, as tolerated  Discharge Condition: stable  Code Status: DNR   History of present illness: As per the H and P dictated on admission Hospital Course:  Mackenzie Walsh is a 87 y.o. female with medical history significant of hypertension, hyperlipidemia, COPD on 2 L oxygen, GERD, hypothyroidism, atrial fibrillation on Xarelto, chronic pain, AICD, mitral valve regurgitation, migraine headaches, anemia, CKD-3A, dCHF, who presents with fall. pt fell asleep at the table and slid on the floor. C/o right hip pain, not sure about LOC.  ED work-up patient was found to have COVID-positive and Chest x-ray negative.  CT of head and neck negative for acute injury.  CT of right hip showed pubic rami fracture.   CT-right hip 1. Acute minimally displaced fracture involving the parasymphyseal aspect of the right pubic bone extending into the right superior pubic ramus. 2. Additional nondisplaced fracture involving the midportion of the right  inferior pubic ramus. 3. Small focal hematoma along the superior margin of the right superior pubic ramus fracture site measuring 3.1 x 1.9 x 2.3 cm.  Assessment and Plan: Fall and pubic ramus fracture: ED physician discussed with Dr. Posey Pronto of orthopedic surgery.  He recommended weightbearing as tolerated.  Patient is from independent living facility, patient may need to go back to the same facility, but to SNF unit. Hematoma due to fall, apply ice pack to stop bleeding was advised to patient. Continue Fall precaution.  Continue Tylenol as needed, during hospital stay patient was also given Percocet and morphine and Robaxin for pain control which can be resumed at the SNF facility if needed.  Patient was seen by PT and OT, recommended SNF placement which has been arranged today, patient was waiting due to COVID infection.  Medically optimized to discharge today.  Chronic diastolic CHF (congestive heart failure) ([place]): 2D echo on 11/28/2020 showed EF > 55%.  Patient does not have leg edema or JVD.  CHF seem to be compensated.  Blood pressures are soft, so held Lasix and spironolactone and losartan during hospital stay, discontinued on discharge.  Monitor BP and titrate medication accordingly.  BNP 519 but clinically she was not volume overload.  Continue to reassess and restart diuretics when needs A-fib, Hold Xarelto due to hematoma during hospital stay, resumed on discharge.  Repeat CBC after 1 week. Continue Coreg, amiodarone COPD (chronic obstructive pulmonary disease), Stable, uses oxygen at nighttime, use inhalers as needed  Hyperlipidemia, continued Zocor Hypertension: Blood pressure soft, Continue Coreg, discontinued Cozaar, Lasix, spironolactone Hypothyroidism, Synthroid  CKD (chronic kidney disease), stage IIIa: Stable Macrocytic anemia: Hemoglobin 10.5, Iron and folate within normal range COVID-19 virus infection: Patient had minimal symptoms, currently asymptomatic, saturating well on room air.   Use albuterol as needed and Mucinex as needed. Hypophosphatemia, phos repleted. Body mass index is 20.12 kg/m.  Nutrition Problem: Moderate Malnutrition Etiology: chronic illness (COPD) Nutrition Interventions: Interventions: MVI, Ensure Enlive (each supplement provides 350kcal and 20 grams of protein), Liberalize Diet     Patient was seen by physical therapy, who recommended SNF placement, which was arranged. On the day of the discharge the patient's vitals were stable, and no other acute medical condition were reported by patient. the patient was felt safe to be discharge at [facility].  Consultants: Orthopedics Dr. Posey Pronto, recommended weightbearing as tolerated and follow-up as an outpatient Procedures: None  Discharge Exam: General: Appear in no distress, no Rash; Oral Mucosa Clear, moist. Cardiovascular: S1 and S2 Present, no Murmur, Respiratory: normal respiratory effort, Bilateral Air entry present and no Crackles, no wheezes Abdomen: Bowel Sound present, Soft and no tenderness, no hernia Extremities: no Pedal edema, no calf tenderness Neurology: alert and oriented to time, place, and person affect appropriate.  Filed Weights   05/26/21 0954 05/27/21 0500  Weight: 49.9 kg 49.9 kg   Vitals:   05/29/21 0918 05/29/21 1204  BP: 117/75 121/75  Pulse: 84 81  Resp: 16 16  Temp: 97.9 F (36.6 C) 98 F (36.7 C)  SpO2: 100% 99%    DISCHARGE MEDICATION: Allergies as of 05/29/2021       Reactions   Erythromycin Base Hives   Gluten Meal Other (See Comments)   Celiac Disease   Dofetilide Rash        Medication List     STOP taking these medications    furosemide 40 MG tablet Commonly known as: LASIX   Lagevrio 200 MG Caps capsule Generic drug: molnupiravir EUA   losartan 25 MG tablet Commonly known as: COZAAR   predniSONE 20 MG tablet Commonly known as: DELTASONE   spironolactone 25 MG tablet Commonly known as: ALDACTONE       TAKE these medications     Acetaminophen 500 MG capsule Take 2 capsules (1,000 mg total) by mouth 3 times/day as needed-between meals & bedtime.   albuterol (2.5 MG/3ML) 0.083% nebulizer solution Commonly known as: PROVENTIL Take 3 mLs (2.5 mg total) by nebulization every 4 (four) hours as needed for wheezing or shortness of breath.   amiodarone 200 MG tablet Commonly known as: PACERONE Take 200 mg by mouth daily.   bifidobacterium infantis capsule Take 1 capsule by mouth daily.   CALCIUM CITRATE PO Take 600 mg by mouth daily.   carvedilol 12.5 MG tablet Commonly known as: COREG Take 12.5 mg by mouth 2 (two) times daily with a meal.   Citracal Maximum 315-6.25 MG-MCG Tabs Generic drug: Calcium Citrate-Vitamin D3 Take 1 tablet by mouth daily.   CoQ-10 100 MG Caps Take 100 mg by mouth daily.   fluticasone 50 MCG/ACT nasal spray Commonly known as: FLONASE Place 1-2 sprays into both nostrils daily.   gabapentin 300 MG capsule Commonly known as: NEURONTIN Take 300 mg by mouth 3 (three) times daily.   levothyroxine 112 MCG tablet Commonly known as: SYNTHROID Take 112 mcg by mouth daily before breakfast.   Magnesium Oxide 200 MG Tabs Take 200 mg by mouth daily.   MULTI-VITAMIN DAILY PO Take 1 tablet by mouth daily.   omeprazole 40 MG capsule Commonly known as: PRILOSEC  Take 40 mg by mouth daily.   Rivaroxaban 15 MG Tabs tablet Commonly known as: XARELTO Take 15 mg by mouth daily with supper.   simvastatin 10 MG tablet Commonly known as: ZOCOR Take 10 mg by mouth at bedtime.       Allergies  Allergen Reactions   Erythromycin Base Hives   Gluten Meal Other (See Comments)    Celiac Disease   Dofetilide Rash   Discharge Instructions     Call MD for:  severe uncontrolled pain   Complete by: As directed    Call MD for:  temperature >100.4   Complete by: As directed    Diet - low sodium heart healthy   Complete by: As directed    Discharge instructions   Complete by: As  directed    Follow with PCP, should be seen by an MD in 1 to 2 days, monitor blood pressure and titrate medication accordingly.  Discontinued losartan, Lasix and spironolactone.  Xarelto was held during hospital stay secondary to hematoma but resumed on discharge to be continued for VTE prophylaxis.  Repeat CBC after 1 week Follow with orthopedics in 1 to 2 weeks as an outpatient   Increase activity slowly   Complete by: As directed        The results of significant diagnostics from this hospitalization (including imaging, microbiology, ancillary and laboratory) are listed below for reference.    Significant Diagnostic Studies: CT Abdomen Pelvis Wo Contrast  Result Date: 05/09/2021 CLINICAL DATA:  Pelvic mass. Chronic shortness of breath. Loss of weight. EXAM: CT ABDOMEN AND PELVIS WITHOUT CONTRAST TECHNIQUE: Multidetector CT imaging of the abdomen and pelvis was performed following the standard protocol without IV contrast. COMPARISON:  NM pet 02/04/2021; CT abdomen and pelvis 12/31/2020; CT chest 06/11/2020. FINDINGS: Lower chest: Heart size is enlarged. An implantable cardioverter-defibrillator is in place with leads in the right atrial appendage, coronary sinus and right ventricle. Coronary artery calcifications and aortic atherosclerosis noted. Extensive pleuroparenchymal scarring noted within both lower lobes, right middle lobe and lingula. Hepatobiliary: No focal liver abnormality is seen. No gallstones, gallbladder wall thickening, or biliary dilatation. Pancreas: The pancreas appears indistinct. As noted previously, there is very little, if any, fat adjacent to the pancreas to provide inherent contrast. No signs of gross inflammation. Spleen: Normal in size without focal abnormality. Adrenals/Urinary Tract: No adrenal mass identified. No signs of nephrolithiasis, kidney mass or hydronephrosis. Urinary bladder appears normal for degree of distension. Stomach/Bowel: Stomach appears within  normal limits. No bowel wall thickening, inflammation or distension. The appendix is not confidently identified. No signs of pericecal inflammation, however. Vascular/Lymphatic: Aortic atherosclerosis without aneurysm. No abdominal adenopathy identified. At the bifurcation of the right common iliac artery, there is a mass in the right hemipelvis inseparable from the adjacent vasculature. This measures 4.8 x 3.2 cm, image 51/2. Formally, this measured 5.0 x 3.7 cm (when remeasured). Reproductive: Uterus and bilateral adnexa are unremarkable. Other: No free fluid or fluid collections. Musculoskeletal: Bones are diffusely osteopenic. Previous open reduction internal fixation of the left femur. Scoliosis and degenerative disc disease noted within the thoracolumbar spine. Severe facet arthropathy noted involving the right lower lumbar facet joints. No acute or suspicious osseous findings. IMPRESSION: 1. No acute findings identified within the abdomen or pelvis. 2. Mass in the right hemipelvis is inseparable from the adjacent vasculature and is slightly decreased in size from 12/31/2020. Etiology remains indeterminate but relative stability suggests an indolent or possibly benign process. Recommend continued interval follow-up with  repeat CT of the pelvis with IV and oral contrast in 3 months. 3. Aortic atherosclerosis (ICD10-I70.0). Electronically Signed   By: Kerby Moors M.D.   On: 05/09/2021 13:15   DG Chest 1 View  Result Date: 05/26/2021 CLINICAL DATA:  weakness EXAM: CHEST  1 VIEW COMPARISON:  01/15/2020. FINDINGS: No consolidation. No visible pleural effusions or pneumothorax. Similar cardiomediastinal silhouette. Left subclavian approach cardiac rhythm maintenance device. Osteopenia. Thoracic spine compression fractures are not well evaluated on this single PA radiograph. IMPRESSION: No evidence of acute cardiopulmonary disease. Electronically Signed   By: Margaretha Sheffield M.D.   On: 05/26/2021 11:35    CT HEAD WO CONTRAST (5MM)  Result Date: 05/26/2021 CLINICAL DATA:  87 year old female with history of neck trauma from a fall. EXAM: CT HEAD WITHOUT CONTRAST CT CERVICAL SPINE WITHOUT CONTRAST TECHNIQUE: Multidetector CT imaging of the head and cervical spine was performed following the standard protocol without intravenous contrast. Multiplanar CT image reconstructions of the cervical spine were also generated. COMPARISON:  No priors. FINDINGS: CT HEAD FINDINGS Brain: Moderate cerebral and mild cerebellar atrophy. Patchy and confluent areas of decreased attenuation are noted throughout the deep and periventricular white matter of the cerebral hemispheres bilaterally, compatible with chronic microvascular ischemic disease. No evidence of acute infarction, hemorrhage, hydrocephalus, extra-axial collection or mass lesion/mass effect. Vascular: No hyperdense vessel or unexpected calcification. Skull: Normal. Negative for fracture or focal lesion. Sinuses/Orbits: Multifocal mucosal thickening throughout the visualized paranasal sinuses, compatible with chronic sinusitis. Air-fluid levels are also noted in the frontal sinuses bilaterally (left greater than right). Other: None. CT CERVICAL SPINE FINDINGS Alignment: Exaggeration of normal cervical lordosis, likely positional. Alignment is otherwise anatomic. Skull base and vertebrae: Choose chronic compression fracture of superior endplate of T1, similar to prior chest CT 06/11/2020, with 25% loss of anterior vertebral body height. No acute fracture. No primary bone lesion or focal pathologic process. Soft tissues and spinal canal: No prevertebral fluid or swelling. No visible canal hematoma. Disc levels: Mild multilevel degenerative disc disease. Moderate multilevel facet arthropathy, most severe in the left side. Upper chest: Unremarkable. Other: None. IMPRESSION: 1. No evidence of significant acute traumatic injury to the skull, brain or cervical spine. 2.  Sinusitis with both acute and chronic features, as above. 3. Moderate cerebral and mild cerebellar atrophy with extensive chronic microvascular ischemic changes in the cerebral white matter, as above. 4. Mild multilevel degenerative disc disease and cervical spondylosis. Electronically Signed   By: Vinnie Langton M.D.   On: 05/26/2021 11:19   CT Cervical Spine Wo Contrast  Result Date: 05/26/2021 CLINICAL DATA:  87 year old female with history of neck trauma from a fall. EXAM: CT HEAD WITHOUT CONTRAST CT CERVICAL SPINE WITHOUT CONTRAST TECHNIQUE: Multidetector CT imaging of the head and cervical spine was performed following the standard protocol without intravenous contrast. Multiplanar CT image reconstructions of the cervical spine were also generated. COMPARISON:  No priors. FINDINGS: CT HEAD FINDINGS Brain: Moderate cerebral and mild cerebellar atrophy. Patchy and confluent areas of decreased attenuation are noted throughout the deep and periventricular white matter of the cerebral hemispheres bilaterally, compatible with chronic microvascular ischemic disease. No evidence of acute infarction, hemorrhage, hydrocephalus, extra-axial collection or mass lesion/mass effect. Vascular: No hyperdense vessel or unexpected calcification. Skull: Normal. Negative for fracture or focal lesion. Sinuses/Orbits: Multifocal mucosal thickening throughout the visualized paranasal sinuses, compatible with chronic sinusitis. Air-fluid levels are also noted in the frontal sinuses bilaterally (left greater than right). Other: None. CT CERVICAL SPINE FINDINGS Alignment: Exaggeration  of normal cervical lordosis, likely positional. Alignment is otherwise anatomic. Skull base and vertebrae: Choose chronic compression fracture of superior endplate of T1, similar to prior chest CT 06/11/2020, with 25% loss of anterior vertebral body height. No acute fracture. No primary bone lesion or focal pathologic process. Soft tissues and spinal  canal: No prevertebral fluid or swelling. No visible canal hematoma. Disc levels: Mild multilevel degenerative disc disease. Moderate multilevel facet arthropathy, most severe in the left side. Upper chest: Unremarkable. Other: None. IMPRESSION: 1. No evidence of significant acute traumatic injury to the skull, brain or cervical spine. 2. Sinusitis with both acute and chronic features, as above. 3. Moderate cerebral and mild cerebellar atrophy with extensive chronic microvascular ischemic changes in the cerebral white matter, as above. 4. Mild multilevel degenerative disc disease and cervical spondylosis. Electronically Signed   By: Vinnie Langton M.D.   On: 05/26/2021 11:19   CT Hip Right Wo Contrast  Result Date: 05/26/2021 CLINICAL DATA:  Right hip pain after fall 1 day ago EXAM: CT OF THE RIGHT HIP WITHOUT CONTRAST TECHNIQUE: Multidetector CT imaging of the right hip was performed according to the standard protocol. Multiplanar CT image reconstructions were also generated. COMPARISON:  X-ray 05/26/2021 FINDINGS: Bones/Joint/Cartilage Acute minimally displaced fracture involving the parasymphyseal aspect of the right pubic bone extending into the right superior pubic ramus. Additional nondisplaced fracture involving the midportion of the right inferior pubic ramus. Pubic symphysis intact without diastasis. Mild arthropathy of the pubic symphysis with chondrocalcinosis. Right hip joint is intact without fracture or dislocation. Right hip joint space is relatively well preserved. Bones appear demineralized. Ligaments Suboptimally assessed by CT. Muscles and Tendons No acute musculotendinous abnormality by CT. Soft tissues Small focal hematoma along the superior margin of the right superior pubic ramus fracture site measuring 3.1 x 1.9 x 2.3 cm. IMPRESSION: 1. Acute minimally displaced fracture involving the parasymphyseal aspect of the right pubic bone extending into the right superior pubic ramus. 2.  Additional nondisplaced fracture involving the midportion of the right inferior pubic ramus. 3. Small focal hematoma along the superior margin of the right superior pubic ramus fracture site measuring 3.1 x 1.9 x 2.3 cm. Electronically Signed   By: Davina Poke D.O.   On: 05/26/2021 13:41   DG Hip Unilat W or Wo Pelvis 2-3 Views Right  Result Date: 05/26/2021 CLINICAL DATA:  Hip pain EXAM: DG HIP (WITH OR WITHOUT PELVIS) 2-3V RIGHT COMPARISON:  CT abdomen/pelvis 05/08/2021 FINDINGS: Postsurgical changes reflecting intramedullary nail and screw fixation of the left hip are noted. Deformity of the proximal left femur is similar to the prior CT abdomen/pelvis. No right hip fracture is seen. Femoroacetabular alignment is normal. The SI joints and symphysis pubis are intact. IMPRESSION: 1. No evidence of right hip fracture. 2. Postsurgical changes in the left hip without definite evidence of perihardware fracture. Electronically Signed   By: Valetta Mole M.D.   On: 05/26/2021 11:35    Microbiology: Recent Results (from the past 240 hour(s))  Resp Panel by RT-PCR (Flu A&B, Covid) Nasopharyngeal Swab     Status: Abnormal   Collection Time: 05/26/21 10:30 AM   Specimen: Nasopharyngeal Swab; Nasopharyngeal(NP) swabs in vial transport medium  Result Value Ref Range Status   SARS Coronavirus 2 by RT PCR POSITIVE (A) NEGATIVE Final    Comment: RESULT CALLED TO, READ BACK BY AND VERIFIED WITH: C/LORRIE LEMINS 1221 05/26/21 AMK (NOTE) SARS-CoV-2 target nucleic acids are DETECTED.  The SARS-CoV-2 RNA is generally detectable in  upper respiratory specimens during the acute phase of infection. Positive results are indicative of the presence of the identified virus, but do not rule out bacterial infection or co-infection with other pathogens not detected by the test. Clinical correlation with patient history and other diagnostic information is necessary to determine patient infection status. The expected  result is Negative.  Fact Sheet for Patients: EntrepreneurPulse.com.au  Fact Sheet for Healthcare Providers: IncredibleEmployment.be  This test is not yet approved or cleared by the [place] FDA and  has been authorized for detection and/or diagnosis of SARS-CoV-2 by FDA under an Emergency Use Authorization (EUA).  This EUA will remain in effect (meaning this test can be  used) for the duration of  the COVID-19 declaration under Section 564(b)(1) of the Act, 21 U.S.C. section 360bbb-3(b)(1), unless the authorization is terminated or revoked sooner.     Influenza A by PCR NEGATIVE NEGATIVE Final   Influenza B by PCR NEGATIVE NEGATIVE Final    Comment: (NOTE) The Xpert Xpress SARS-CoV-2/FLU/RSV plus assay is intended as an aid in the diagnosis of influenza from Nasopharyngeal swab specimens and should not be used as a sole basis for treatment. Nasal washings and aspirates are unacceptable for Xpert Xpress SARS-CoV-2/FLU/RSV testing.  Fact Sheet for Patients: EntrepreneurPulse.com.au  Fact Sheet for Healthcare Providers: IncredibleEmployment.be  This test is not yet approved or cleared by the [place] FDA and has been authorized for detection and/or diagnosis of SARS-CoV-2 by FDA under an Emergency Use Authorization (EUA). This EUA will remain in effect (meaning this test can be used) for the duration of the COVID-19 declaration under Section 564(b)(1) of the Act, 21 U.S.C. section 360bbb-3(b)(1), unless the authorization is terminated or revoked.  Performed at [facility], [address] [place], [place] [address]      Labs: CBC: Recent Labs  Lab 05/26/21 1030 05/27/21 0432 05/28/21 0207 05/29/21 0452  WBC 8.0 5.3 5.2 5.3  HGB 10.5* 9.8* 10.0* 9.5*  HCT 33.0* 31.3* 32.0* 31.1*  MCV 110.0* 108.7* 109.2* 110.3*  PLT 163 149* 145* [id number]*   Basic Metabolic Panel: Recent Labs   Lab 05/26/21 1030 05/27/21 0847 05/28/21 0207 05/29/21 0452  NA 135 136 136 139  K 4.1 4.2 4.0 4.0  CL 103 102 103 101  CO2 27 30 30  32  GLUCOSE 93 109* 92 86  BUN 35* 27* 28* 24*  CREATININE 1.13* 0.95 0.87 0.82  CALCIUM 8.5* 8.3* 8.2* 8.2*  MG  --  1.9 2.0 2.0  PHOS  --  2.6 2.2* 3.8   Liver Function Tests: Recent Labs  Lab 05/26/21 1030 05/27/21 0847  AST 41 36  ALT 91* 74*  ALKPHOS 72 72  BILITOT 1.4* 0.9  PROT 5.7* 5.5*  ALBUMIN 3.1* 3.0*   No results for input(s): LIPASE, AMYLASE in the last 168 hours. No results for input(s): AMMONIA in the last 168 hours. Cardiac Enzymes: No results for input(s): CKTOTAL, CKMB, CKMBINDEX, TROPONINI in the last 168 hours. BNP (last 3 results) Recent Labs    05/26/21 1030  BNP 519.2*   CBG: No results for input(s): GLUCAP in the last 168 hours.  Time spent: 35 minutes  Signed:  Val Riles  [facility]  05/29/2021 12:12 PM

## 2021-06-26 ENCOUNTER — Telehealth: Payer: Self-pay | Admitting: Oncology

## 2021-06-26 NOTE — Telephone Encounter (Signed)
Daughter-in-law called to ? Reschedule pt's appt for 08-12-21. Pt is currently in a rehab. Call back at [phone]

## 2021-07-03 ENCOUNTER — Telehealth: Payer: Self-pay | Admitting: Oncology

## 2021-07-03 NOTE — Telephone Encounter (Signed)
Marcene Brawn has been updated with appointments

## 2021-07-03 NOTE — Telephone Encounter (Signed)
Daughter called wanting to talk to RN about pt. Pt has a fracture hip and is in SNF. They can get pt here but it will be hard. Appt is 2-21. Call back at [phone] to help decide what to do.

## 2021-07-03 NOTE — Telephone Encounter (Signed)
Called and spoke to pt's daughter Moshe Salisbury [phone]). Pt's is in rehab and they would like to push out appts a month (March) to allow recovery time. Please move out CT to March and MD/ venofer a few days after CT. Pt will get labs at main hospital when she is there for CT.  Please update Cara with appts.

## 2021-07-27 DIAGNOSIS — Z95810 Presence of automatic (implantable) cardiac defibrillator: Secondary | ICD-10-CM

## 2021-08-08 ENCOUNTER — Other Ambulatory Visit (INDEPENDENT_AMBULATORY_CARE_PROVIDER_SITE_OTHER): Payer: Medicare Other | Admitting: Cardiovascular Disease

## 2021-08-08 ENCOUNTER — Other Ambulatory Visit: Payer: Medicare Other

## 2021-08-08 LAB — REMOTE CARDIAC DEVICE MONITORING
AF Burden Percentage: 36
ICD Shock Recent Count: 0
RV Pacing Percentage: 95

## 2021-08-12 ENCOUNTER — Other Ambulatory Visit: Payer: Medicare Other

## 2021-08-12 ENCOUNTER — Ambulatory Visit: Payer: Medicare Other | Admitting: Oncology

## 2021-08-12 ENCOUNTER — Ambulatory Visit: Payer: Medicare Other

## 2021-09-12 ENCOUNTER — Other Ambulatory Visit: Payer: Self-pay

## 2021-09-12 ENCOUNTER — Ambulatory Visit
Admission: RE | Admit: 2021-09-12 | Discharge: 2021-09-12 | Disposition: A | Discharge status: Home / Self Care | Payer: Medicare Other | Source: Ambulatory Visit | Attending: Oncology | Admitting: Oncology

## 2021-09-12 ENCOUNTER — Other Ambulatory Visit
Admission: RE | Admit: 2021-09-12 | Discharge: 2021-09-12 | Disposition: A | Discharge status: Home / Self Care | Payer: Medicare Other | Source: Ambulatory Visit | Attending: Oncology | Admitting: Oncology

## 2021-09-12 DIAGNOSIS — D509 Iron deficiency anemia, unspecified: Secondary | ICD-10-CM

## 2021-09-12 DIAGNOSIS — R1900 Intra-abdominal and pelvic swelling, mass and lump, unspecified site: Secondary | ICD-10-CM | POA: Insufficient documentation

## 2021-09-12 LAB — IRON AND TIBC
Iron: 32 ug/dL (ref 28–170)
Saturation Ratios: 10 % — ABNORMAL LOW (ref 10.4–31.8)
TIBC: 329 ug/dL (ref 250–450)
UIBC: 297 ug/dL

## 2021-09-12 LAB — CBC WITH DIFFERENTIAL/PLATELET
Abs Immature Granulocytes: 0.02 10*3/uL (ref 0.00–0.07)
Basophils Absolute: 0 10*3/uL (ref 0.0–0.1)
Basophils Relative: 0 %
Eosinophils Absolute: 0 10*3/uL (ref 0.0–0.5)
Eosinophils Relative: 0 %
HCT: 34.5 % — ABNORMAL LOW (ref 36.0–46.0)
Hemoglobin: 10.6 g/dL — ABNORMAL LOW (ref 12.0–15.0)
Immature Granulocytes: 0 %
Lymphocytes Relative: 15 %
Lymphs Abs: 1 10*3/uL (ref 0.7–4.0)
MCH: 31.9 pg (ref 26.0–34.0)
MCHC: 30.7 g/dL (ref 30.0–36.0)
MCV: 103.9 fL — ABNORMAL HIGH (ref 80.0–100.0)
Monocytes Absolute: 0.5 10*3/uL (ref 0.1–1.0)
Monocytes Relative: 8 %
Neutro Abs: 5.1 10*3/uL (ref 1.7–7.7)
Neutrophils Relative %: 77 %
Platelets: 161 10*3/uL (ref 150–400)
RBC: 3.32 MIL/uL — ABNORMAL LOW (ref 3.87–5.11)
RDW: 14.6 % (ref 11.5–15.5)
WBC: 6.8 10*3/uL (ref 4.0–10.5)
nRBC: 0 % (ref 0.0–0.2)

## 2021-09-12 LAB — FERRITIN: Ferritin: 168 ng/mL (ref 11–307)

## 2021-09-12 MED ORDER — IOHEXOL 300 MG/ML  SOLN
75.0000 mL | Freq: Once | INTRAMUSCULAR | Status: AC | PRN
  Administered 2021-09-12: 75 mL via INTRAVENOUS

## 2021-09-15 ENCOUNTER — Encounter: Payer: Self-pay | Admitting: Oncology

## 2021-09-15 ENCOUNTER — Ambulatory Visit
Admission: RE | Admit: 2021-09-15 | Discharge: 2021-09-15 | Disposition: A | Discharge status: Home / Self Care | Payer: Medicare Other | Source: Ambulatory Visit | Attending: Oncology | Admitting: Oncology

## 2021-09-15 ENCOUNTER — Inpatient Hospital Stay: Payer: Medicare Other | Attending: Oncology | Admitting: Oncology

## 2021-09-15 ENCOUNTER — Inpatient Hospital Stay: Payer: Medicare Other

## 2021-09-15 ENCOUNTER — Other Ambulatory Visit: Payer: Self-pay

## 2021-09-15 VITALS — BP 123/79 | HR 79 | Resp 18

## 2021-09-15 VITALS — BP 140/98 | HR 76 | Temp 97.3°F | Resp 18 | Wt 113.2 lb

## 2021-09-15 DIAGNOSIS — R051 Acute cough: Secondary | ICD-10-CM

## 2021-09-15 DIAGNOSIS — D509 Iron deficiency anemia, unspecified: Secondary | ICD-10-CM | POA: Insufficient documentation

## 2021-09-15 DIAGNOSIS — D631 Anemia in chronic kidney disease: Secondary | ICD-10-CM

## 2021-09-15 DIAGNOSIS — R1900 Intra-abdominal and pelvic swelling, mass and lump, unspecified site: Secondary | ICD-10-CM | POA: Insufficient documentation

## 2021-09-15 DIAGNOSIS — N1832 Chronic kidney disease, stage 3b: Secondary | ICD-10-CM

## 2021-09-15 MED ORDER — IRON SUCROSE 20 MG/ML IV SOLN
200.0000 mg | Freq: Once | INTRAVENOUS | Status: AC
  Administered 2021-09-15: 200 mg via INTRAVENOUS
  Filled 2021-09-15: qty 10

## 2021-09-15 MED ORDER — SODIUM CHLORIDE 0.9 % IV SOLN
Freq: Once | INTRAVENOUS | Status: AC
  Filled 2021-09-15: qty 250

## 2021-09-15 MED ORDER — SODIUM CHLORIDE 0.9 % IV SOLN: 200.0000 mg | Freq: Once | INTRAVENOUS | Status: DC

## 2021-09-15 NOTE — Progress Notes (Signed)
Patient here for follow up. Pt reports that she has developed a cough with a lot of phlegm, worse in the mornings.  ?

## 2021-09-15 NOTE — Progress Notes (Signed)
?Hematology/Oncology Progress note ?[phone] [phone] ?  ? ? ? ?Patient Care Team: ?Leonel Ramsay, MD as PCP - General (Infectious Diseases) ? ?REFERRING PROVIDER: ?Leonel Ramsay, MD  ?CHIEF COMPLAINTS/REASON FOR VISIT:  ?Anemia, weight loss, pelvic mass ? ?HISTORY OF PRESENTING ILLNESS:  ? ?Mackenzie Walsh is a  88 y.o.  female with PMH listed below was seen in consultation at the request of  Leonel Ramsay, MD  for evaluation of abnormal SPEP ?Patient was recently seen by Dr. Lacinda Axon neurosurgery for thoracic back pain with radiculopathy.  Patient has scoliosis, 3 weeks ago she started to feel severe upper back pain. ?X-ray of thoracic spine on 10/17/2019 showed multiple thoracic compression fractures.   ?11/07/2019 CT thoracic spine without contrast showed old and healed compression deformities at T1, T8 and T9.  Potentially more recent compression fracture at T3 and T7. ?10/17/2019, patient had blood work-up including SPEP which showed  M spike of 3.7. ?CBC showed hemoglobin 11.5, MCV 107, platelet count 141.  No recent CMP ?Patient has CKD with creatinine baseline around 1.5. ? ?Patient has multiple comorbidities including dilated cardiomyopathy, ventricular tachycardia, implantable cardioverter, chronic systolic CHF, CKD stage III, compression fracture, scoliosis deformity of the spine, mitral valve regurgitation, celiac disease, hypothyroidism, hypercholesterolemia, atrial fibrillation on anticoagulation, osteoporosis. ?She lives with her husband who has Alzheimer's disease.  Her children live in [place].  Her son lives about 30 minutes away from her home.  She gets help for grocery shopping.  She still drives herself and her husband to medical appointments. ? ? ?INTERVAL HISTORY ?Mackenzie Walsh is a 88 y.o. female who has above history reviewed by me today presents for follow up visit for pelvic mass, history of iron deficiency, unintentional weight  loss ?Patient reports cough for 3 days, productive with greenish-yellowish sputum.  No fever or chills.  Patient is on nasal cannula oxygen. ?She lost 2 pounds comparing to 4 months ago. ? ?Review of Systems  ?Constitutional:  Positive for fatigue and unexpected weight change. Negative for appetite change, chills and fever.  ?HENT:   Negative for hearing loss and voice change.   ?Eyes:  Negative for eye problems.  ?Respiratory:  Positive for shortness of breath. Negative for chest tightness and cough.   ?Cardiovascular:  Negative for chest pain.  ?Gastrointestinal:  Negative for abdominal distention, abdominal pain and blood in stool.  ?Endocrine: Negative for hot flashes.  ?Genitourinary:  Negative for difficulty urinating and frequency.   ?Musculoskeletal:  Positive for back pain. Negative for arthralgias.  ?Skin:  Negative for itching and rash.  ?Neurological:  Negative for extremity weakness.  ?Hematological:  Negative for adenopathy.  ?Psychiatric/Behavioral:  Negative for confusion.   ? ?MEDICAL HISTORY:  ?Past Medical History:  ?Diagnosis Date  ? A-fib ([place])   ? AICD (automatic cardioverter/defibrillator) present   ? Allergy   ? Anemia   ? Arrhythmia   ? Atrial fibrillation ([place])   ? Broken femur ([place]) 2017  ? Cataract   ? Celiac disease   ? Chronic pain syndrome   ? Chronic systolic CHF (congestive heart failure) ([place])   ? CKD (chronic kidney disease)   ? COPD (chronic obstructive pulmonary disease) ([place])   ? Dilated cardiomyopathy ([place])   ? Dysrhythmia   ? Hyperlipidemia   ? Hypertension   ? Iron deficiency anemia 12/09/2020  ? Migraines   ? Mitral valve regurgitation   ? Osteoporosis   ? Posterior rhinorrhea   ? Presence of  permanent cardiac pacemaker   ? Scoliosis deformity of spine   ? Senile hyperkeratosis   ? Thyroid disease   ? hypothyroid  ? Ventricular tachyarrhythmia   ? ? ?SURGICAL HISTORY: ?Past Surgical History:  ?Procedure Laterality Date  ? ablation arrythmia focus    ? APPENDECTOMY  1945  ?  CARDIAC DEFIBRILLATOR PLACEMENT  2016  ? CARDIAC SURGERY    ? CARDIOVERSION EXTERNAL X4    ? COLONOSCOPY N/A 04/10/2021  ? Procedure: COLONOSCOPY;  Surgeon: Annamaria Helling, DO;  Location: [name] ENDOSCOPY;  Service: Gastroenterology;  Laterality: N/A;  ? CORONARY ARTERY BYPASS GRAFT    ? ESOPHAGOGASTRODUODENOSCOPY (EGD) WITH PROPOFOL N/A 04/10/2021  ? Procedure: ESOPHAGOGASTRODUODENOSCOPY (EGD) WITH PROPOFOL;  Surgeon: Annamaria Helling, DO;  Location: [facility] ENDOSCOPY;  Service: Gastroenterology;  Laterality: N/A;  ? PACEMAKER IMPLANT    ? REPLACEMENT TOTAL KNEE RIGHT  2016  ? TONSILLECTOMY  1945  ? ? ?SOCIAL HISTORY: ?Social History  ? ?Socioeconomic History  ? Marital status: Married  ?  Spouse name: Not on file  ? Number of children: Not on file  ? Years of education: Not on file  ? Highest education level: Not on file  ?Occupational History  ? Not on file  ?Tobacco Use  ? Smoking status: Never  ? Smokeless tobacco: Never  ?Vaping Use  ? Vaping Use: Never used  ?Substance and Sexual Activity  ? Alcohol use: Not Currently  ? Drug use: Never  ? Sexual activity: Not on file  ?Other Topics Concern  ? Not on file  ?Social History Narrative  ? Not on file  ? ?Social Determinants of Health  ? ?Financial Resource Strain: Not on file  ?Food Insecurity: Not on file  ?Transportation Needs: Not on file  ?Physical Activity: Not on file  ?Stress: Not on file  ?Social Connections: Not on file  ?Intimate Partner Violence: Not on file  ? ? ?FAMILY HISTORY: ?Family History  ?Problem Relation Age of Onset  ? Pneumonia Mother   ? Osteoporosis Mother   ? Stroke Father   ? Heart failure Father   ? Hypertension Father   ? Obesity Father   ? Heart failure Sister   ? ? ?ALLERGIES:  is allergic to erythromycin base, gluten meal, and dofetilide. ? ?MEDICATIONS:  ?Current Outpatient Medications  ?Medication Sig Dispense Refill  ? Acetaminophen 500 MG capsule Take 2 capsules (1,000 mg total) by mouth 3 times/day as needed-between  meals & bedtime. 30 capsule   ? albuterol (PROVENTIL) (2.5 MG/3ML) 0.083% nebulizer solution Take 3 mLs (2.5 mg total) by nebulization every 4 (four) hours as needed for wheezing or shortness of breath. 75 mL 2  ? amiodarone (PACERONE) 200 MG tablet Take 200 mg by mouth daily.     ? bifidobacterium infantis (ALIGN) capsule Take 1 capsule by mouth daily.    ? CALCIUM CITRATE PO Take 600 mg by mouth daily.    ? carvedilol (COREG) 12.5 MG tablet Take 12.5 mg by mouth 2 (two) times daily with a meal.     ? CITRACAL MAXIMUM 315-250 MG-UNIT TABS Take 1 tablet by mouth daily.    ? Coenzyme Q10 (COQ-10) 100 MG CAPS Take 100 mg by mouth daily.     ? fluticasone (FLONASE) 50 MCG/ACT nasal spray Place 1-2 sprays into both nostrils daily.     ? gabapentin (NEURONTIN) 300 MG capsule Take 300 mg by mouth 3 (three) times daily.     ? levothyroxine (SYNTHROID) 112 MCG tablet Take  112 mcg by mouth daily before breakfast.     ? Magnesium Oxide 200 MG TABS Take 200 mg by mouth daily.     ? Multiple Vitamin (MULTI-VITAMIN DAILY PO) Take 1 tablet by mouth daily.     ? omeprazole (PRILOSEC) 40 MG capsule Take 40 mg by mouth daily.    ? Rivaroxaban (XARELTO) 15 MG TABS tablet Take 15 mg by mouth daily with supper.     ? simvastatin (ZOCOR) 10 MG tablet Take 10 mg by mouth at bedtime.    ? ?No current facility-administered medications for this visit.  ? ? ? ?PHYSICAL EXAMINATION: ?ECOG PERFORMANCE STATUS: 1 - Symptomatic but completely ambulatory ?Vitals:  ? 09/15/21 1308  ?BP: (!) 140/98  ?Pulse: 76  ?Resp: 18  ?Temp: (!) 97.3 ?F (36.3 ?C)  ? ?Filed Weights  ? 09/15/21 1308  ?Weight: 113 lb 3.2 oz (51.3 kg)  ? ? ?Physical Exam ?Constitutional:   ?   General: She is not in acute distress. ?   Comments: Frail appearance  ?HENT:  ?   Head: Normocephalic and atraumatic.  ?Eyes:  ?   General: No scleral icterus. ?Cardiovascular:  ?   Rate and Rhythm: Normal rate and regular rhythm.  ?   Heart sounds: Normal heart sounds.  ?Pulmonary:  ?    Effort: Pulmonary effort is normal. No respiratory distress.  ?   Breath sounds: No wheezing.  ?   Comments: Severely decreased breath sound bilateral lower lung fields ?Abdominal:  ?   General: Bowel sounds are norma

## 2021-09-15 NOTE — Patient Instructions (Signed)

## 2021-09-16 ENCOUNTER — Telehealth: Payer: Self-pay

## 2021-09-16 NOTE — Telephone Encounter (Signed)
Patient informed and verbalized understanding

## 2021-09-16 NOTE — Telephone Encounter (Signed)
-----   Message from Earlie Server, MD sent at 09/15/2021 10:39 PM EDT ----- ?Let her know that chest Xray showed no signs of pneumonia.  If her cough persists, recommend her to call primary care provider.  Thanks ?

## 2021-09-29 ENCOUNTER — Inpatient Hospital Stay: Payer: Medicare Other | Attending: Oncology

## 2021-09-29 VITALS — BP 95/62 | HR 79 | Temp 96.0°F | Resp 18

## 2021-09-29 DIAGNOSIS — N1832 Chronic kidney disease, stage 3b: Secondary | ICD-10-CM | POA: Insufficient documentation

## 2021-09-29 DIAGNOSIS — D509 Iron deficiency anemia, unspecified: Secondary | ICD-10-CM | POA: Diagnosis present

## 2021-09-29 MED ORDER — IRON SUCROSE 20 MG/ML IV SOLN
200.0000 mg | Freq: Once | INTRAVENOUS | Status: AC
  Administered 2021-09-29: 200 mg via INTRAVENOUS
  Filled 2021-09-29: qty 10

## 2021-09-29 MED ORDER — SODIUM CHLORIDE 0.9 % IV SOLN
INTRAVENOUS | Status: DC
  Filled 2021-09-29: qty 250

## 2021-09-29 MED ORDER — SODIUM CHLORIDE 0.9 % IV SOLN: 200.0000 mg | Freq: Once | INTRAVENOUS | Status: DC

## 2021-09-29 NOTE — Patient Instructions (Signed)
[facility]  Discharge Instructions: °Thank you for choosing [facility] to provide your oncology and hematology care.  °If you have a lab appointment with the Cancer Center, please go directly to the Cancer Center and check in at the registration area. ° °Wear comfortable clothing and clothing appropriate for easy access to any Portacath or PICC line.  ° °We strive to give you quality time with your provider. You may need to reschedule your appointment if you arrive late (15 or more minutes).  Arriving late affects you and other patients whose appointments are after yours.  Also, if you miss three or more appointments without notifying the office, you may be dismissed from the clinic at the provider’s discretion.    °  °For prescription refill requests, have your pharmacy contact our office and allow 72 hours for refills to be completed.   ° °Today you received the following : Venofer °  °To help prevent nausea and vomiting after your treatment, we encourage you to take your nausea medication as directed. ° °BELOW ARE SYMPTOMS THAT SHOULD BE REPORTED IMMEDIATELY: °*FEVER GREATER THAN 100.4 F (38 °C) OR HIGHER °*CHILLS OR SWEATING °*NAUSEA AND VOMITING THAT IS NOT CONTROLLED WITH YOUR NAUSEA MEDICATION °*UNUSUAL SHORTNESS OF BREATH °*UNUSUAL BRUISING OR BLEEDING °*URINARY PROBLEMS (pain or burning when urinating, or frequent urination) °*BOWEL PROBLEMS (unusual diarrhea, constipation, pain near the anus) °TENDERNESS IN MOUTH AND THROAT WITH OR WITHOUT PRESENCE OF ULCERS (sore throat, sores in mouth, or a toothache) °UNUSUAL RASH, SWELLING OR PAIN  °UNUSUAL VAGINAL DISCHARGE OR ITCHING  ° °Items with * indicate a potential emergency and should be followed up as soon as possible or go to the Emergency Department if any problems should occur. ° °Please show the CHEMOTHERAPY ALERT CARD or IMMUNOTHERAPY ALERT CARD at check-in to the Emergency Department and triage  nurse. ° °Should you have questions after your visit or need to cancel or reschedule your appointment, please contact [facility]  [phone] and follow the prompts.  Office hours are 8:00 a.m. to 4:30 p.m. Monday - Friday. Please note that voicemails left after 4:00 p.m. may not be returned until the following business day.  We are closed weekends and major holidays. You have access to a nurse at all times for urgent questions. Please call the main number to the clinic [phone] and follow the prompts. ° °For any non-urgent questions, you may also contact your provider using MyChart. We now offer e-Visits for anyone 18 and older to request care online for non-urgent symptoms. For details visit [email or web address]. °  °Also download the MyChart app! Go to the app store, search "MyChart", open the app, select [place], and log in with your MyChart username and password. ° °Due to Covid, a mask is required upon entering the hospital/clinic. If you do not have a mask, one will be given to you upon arrival. For doctor visits, patients may have 1 support person aged 18 or older with them. For treatment visits, patients cannot have anyone with them due to current Covid guidelines and our immunocompromised population.  °

## 2021-10-06 ENCOUNTER — Inpatient Hospital Stay: Payer: Medicare Other

## 2021-10-06 VITALS — BP 108/70 | HR 80 | Temp 97.2°F | Resp 20

## 2021-10-06 DIAGNOSIS — D509 Iron deficiency anemia, unspecified: Secondary | ICD-10-CM

## 2021-10-06 MED ORDER — IRON SUCROSE 20 MG/ML IV SOLN
200.0000 mg | Freq: Once | INTRAVENOUS | Status: AC
  Administered 2021-10-06: 200 mg via INTRAVENOUS
  Filled 2021-10-06: qty 10

## 2021-10-06 MED ORDER — SODIUM CHLORIDE 0.9 % IV SOLN: 200.0000 mg | Freq: Once | INTRAVENOUS | Status: DC

## 2021-10-06 MED ORDER — SODIUM CHLORIDE 0.9 % IV SOLN
Freq: Once | INTRAVENOUS | Status: DC | PRN
  Filled 2021-10-06: qty 250

## 2021-10-06 MED ORDER — SODIUM CHLORIDE 0.9 % IV SOLN
Freq: Once | INTRAVENOUS | Status: AC
  Filled 2021-10-06: qty 250

## 2021-10-06 NOTE — Patient Instructions (Signed)

## 2021-10-18 ENCOUNTER — Other Ambulatory Visit: Payer: Self-pay

## 2021-10-18 ENCOUNTER — Emergency Department
Admission: EM | Admit: 2021-10-18 | Discharge: 2021-10-18 | Disposition: A | Discharge status: Skilled Nursing Facility | Payer: Medicare Other | Attending: Emergency Medicine | Admitting: Emergency Medicine

## 2021-10-18 ENCOUNTER — Encounter: Payer: Self-pay | Admitting: Emergency Medicine

## 2021-10-18 ENCOUNTER — Emergency Department: Payer: Medicare Other

## 2021-10-18 DIAGNOSIS — Z20822 Contact with and (suspected) exposure to covid-19: Secondary | ICD-10-CM | POA: Diagnosis not present

## 2021-10-18 DIAGNOSIS — J9621 Acute and chronic respiratory failure with hypoxia: Secondary | ICD-10-CM | POA: Diagnosis not present

## 2021-10-18 DIAGNOSIS — N189 Chronic kidney disease, unspecified: Secondary | ICD-10-CM | POA: Insufficient documentation

## 2021-10-18 DIAGNOSIS — Z950 Presence of cardiac pacemaker: Secondary | ICD-10-CM | POA: Diagnosis not present

## 2021-10-18 DIAGNOSIS — I5023 Acute on chronic systolic (congestive) heart failure: Secondary | ICD-10-CM | POA: Diagnosis not present

## 2021-10-18 DIAGNOSIS — Z7901 Long term (current) use of anticoagulants: Secondary | ICD-10-CM | POA: Diagnosis not present

## 2021-10-18 DIAGNOSIS — I509 Heart failure, unspecified: Secondary | ICD-10-CM

## 2021-10-18 DIAGNOSIS — I130 Hypertensive heart and chronic kidney disease with heart failure and stage 1 through stage 4 chronic kidney disease, or unspecified chronic kidney disease: Secondary | ICD-10-CM | POA: Insufficient documentation

## 2021-10-18 DIAGNOSIS — J449 Chronic obstructive pulmonary disease, unspecified: Secondary | ICD-10-CM | POA: Diagnosis not present

## 2021-10-18 DIAGNOSIS — Z96651 Presence of right artificial knee joint: Secondary | ICD-10-CM | POA: Diagnosis not present

## 2021-10-18 DIAGNOSIS — R0602 Shortness of breath: Secondary | ICD-10-CM | POA: Diagnosis present

## 2021-10-18 LAB — CBC WITH DIFFERENTIAL/PLATELET
Abs Immature Granulocytes: 0.03 10*3/uL (ref 0.00–0.07)
Basophils Absolute: 0 10*3/uL (ref 0.0–0.1)
Basophils Relative: 0 %
Eosinophils Absolute: 0.2 10*3/uL (ref 0.0–0.5)
Eosinophils Relative: 2 %
HCT: 38.8 % (ref 36.0–46.0)
Hemoglobin: 11.7 g/dL — ABNORMAL LOW (ref 12.0–15.0)
Immature Granulocytes: 0 %
Lymphocytes Relative: 14 %
Lymphs Abs: 1.2 10*3/uL (ref 0.7–4.0)
MCH: 32.1 pg (ref 26.0–34.0)
MCHC: 30.2 g/dL (ref 30.0–36.0)
MCV: 106.6 fL — ABNORMAL HIGH (ref 80.0–100.0)
Monocytes Absolute: 0.8 10*3/uL (ref 0.1–1.0)
Monocytes Relative: 10 %
Neutro Abs: 6.4 10*3/uL (ref 1.7–7.7)
Neutrophils Relative %: 74 %
Platelets: 210 10*3/uL (ref 150–400)
RBC: 3.64 MIL/uL — ABNORMAL LOW (ref 3.87–5.11)
RDW: 16.4 % — ABNORMAL HIGH (ref 11.5–15.5)
WBC: 8.7 10*3/uL (ref 4.0–10.5)
nRBC: 0 % (ref 0.0–0.2)

## 2021-10-18 LAB — BRAIN NATRIURETIC PEPTIDE: B Natriuretic Peptide: 372.4 pg/mL — ABNORMAL HIGH (ref 0.0–100.0)

## 2021-10-18 LAB — BASIC METABOLIC PANEL
Anion gap: 6 (ref 5–15)
BUN: 27 mg/dL — ABNORMAL HIGH (ref 8–23)
CO2: 39 mmol/L — ABNORMAL HIGH (ref 22–32)
Calcium: 8.9 mg/dL (ref 8.9–10.3)
Chloride: 90 mmol/L — ABNORMAL LOW (ref 98–111)
Creatinine, Ser: 0.94 mg/dL (ref 0.44–1.00)
GFR, Estimated: 59 mL/min — ABNORMAL LOW (ref >60)
Glucose, Bld: 95 mg/dL (ref 70–99)
Potassium: 4.3 mmol/L (ref 3.5–5.1)
Sodium: 135 mmol/L (ref 135–145)

## 2021-10-18 LAB — TROPONIN I (HIGH SENSITIVITY)
Troponin I (High Sensitivity): 12 ng/L (ref <18)
Troponin I (High Sensitivity): 12 ng/L (ref <18)

## 2021-10-18 LAB — RESP PANEL BY RT-PCR (FLU A&B, COVID) ARPGX2
Influenza A by PCR: NEGATIVE
Influenza B by PCR: NEGATIVE
SARS Coronavirus 2 by RT PCR: NEGATIVE

## 2021-10-18 LAB — PROCALCITONIN: Procalcitonin: <0.1 ng/mL

## 2021-10-18 MED ORDER — IPRATROPIUM-ALBUTEROL 0.5-2.5 (3) MG/3ML IN SOLN
3.0000 mL | Freq: Once | RESPIRATORY_TRACT | Status: AC
  Administered 2021-10-18: 3 mL via RESPIRATORY_TRACT
  Filled 2021-10-18: qty 3

## 2021-10-18 MED ORDER — FUROSEMIDE 10 MG/ML IJ SOLN
20.0000 mg | Freq: Once | INTRAMUSCULAR | Status: AC
  Administered 2021-10-18: 20 mg via INTRAVENOUS
  Filled 2021-10-18: qty 4

## 2021-10-18 MED ORDER — PREDNISONE 50 MG PO TABS: 50.0000 mg | ORAL_TABLET | Freq: Every day | ORAL | 0 refills | Status: AC

## 2021-10-18 MED ORDER — METHYLPREDNISOLONE SODIUM SUCC 125 MG IJ SOLR
125.0000 mg | Freq: Once | INTRAMUSCULAR | Status: AC
Start: 2021-10-18 — End: 2021-10-18
  Administered 2021-10-18: 125 mg via INTRAVENOUS
  Filled 2021-10-18: qty 2

## 2021-10-18 NOTE — ED Notes (Signed)
RN to bedside to introduce self to pt. Son at bedside. Purewick placed at request of pt. Pt CAOx4 and in no acute distress.  ?

## 2021-10-18 NOTE — ED Triage Notes (Signed)
?  Patient BIB EMS for SOB that started last night.  On EMS arrival patient was 62% on 3 L [place].  Patient was placed on NRB @ 10L.  Patient 92% on 2 L on arrival.  Patient has no complaints of pain but still feels SOB. ?

## 2021-10-18 NOTE — ED Notes (Signed)
Resp swab sent to lab. 

## 2021-10-18 NOTE — ED Provider Notes (Signed)
?  Clinical Course as of 10/18/21 0945  ?Sat Oct 18, 2021  ?0715 Patient received in signout from Dr. Don Perking.  Signs of CHF exacerbation, wheezing getting diuresis and breathing treatments. [DS]  ?5427 Assessed the patient and introduced myself to her and her son who is at the bedside.  She reports feeling better after 2 breathing treatments and Lasix.  Has been out about 200 cc of urine..  We discussed second troponin, further observation and disposition.  Possibility of returning to her facility if she continues to feel better and pending second troponin.  They are in agreement. [DS]  ?0623 Reassessed.  Feeling much better.  Remains on her chronic 2 L nasal cannula.  Respiratory rate is slowed to the low 20s.  Wheezing has resolved and basilar crackles have improved.  She is put out nearly 600 cc of urine. ? ?I discussed with patient and her son.  Offered observation admission, but she declines indicating she would prefer to go home.  She has a telehealth follow-up visit already scheduled with her PCP on Monday.  We discussed steroids, breathing treatments and diuretics at home and return precautions for the ED.  They expressed understanding and agreement. [DS]  ?  ?Clinical Course User Index ?[DS] Delton Prairie, MD  ? ?.1-3 Lead EKG Interpretation ?Performed by: Delton Prairie, MD ?Authorized by: Delton Prairie, MD  ? ?  Interpretation: normal   ?  ECG rate:  80 ?  ECG rate assessment: normal   ?  Rhythm: paced   ?  Ectopy: none   ?  Conduction: normal   ? ? ?  ?Delton Prairie, MD ?10/18/21 [phone] ? ?

## 2021-10-18 NOTE — ED Notes (Signed)
Attempted to call [facility]. No answer. Report given to son for DC instructions.  ?

## 2021-10-18 NOTE — Discharge Instructions (Addendum)
Just for this weekend, please use your Lasix/furosemide fluid pill every day.  We already gave her the dose for today while in the ED, so please take this medication tomorrow (Sunday) as well. After that, return to every-other-day dosing.  ? ?Continue all of her other medications as she typically takes them. ? ?She is also being discharged with a prescription for a steroid called prednisone to take once daily for the next 4 days.  We also give her the dose for today while she was in the ED, so please start this medication on Sunday and take once daily. ? ?She can use her albuterol nebulizer/breathing machine every 4-6 hours as needed. ? ?Follow-up with Dr. Sampson Goon on Monday, but if she were to worsen in the meantime please return to the ED. ?

## 2021-10-18 NOTE — ED Provider Notes (Signed)
? ?[facility] ?Provider Note ? ? ? Event Date/Time  ? First MD Initiated Contact with Patient 10/18/21 [phone]   ?  (approximate) ? ? ?History  ? ?Shortness of Breath ? ? ?HPI ? ?Mackenzie Walsh is a 87 y.o. female with history of A-fib on Xarelto, torsades status post AICD, CHF, COPD, chronic respiratory failure on 2 L nasal cannula, hypertension, hyperlipidemia, anemia on iron infusion, pacemaker who presents from her nursing home for shortness of breath.  Patient reports history of chronic shortness of breath however since last night her breathing treatments were no longer helping her.  Per EMS fire department found patient satting 68% on her 2 L.  When EMS arrived she was already on a 15 L nonrebreather.  Patient reports a cough for the last few days.  Denies any chest pain, fever or chills.  She reports compliance with her Xarelto.  Denies any PE or DVT in the past, leg pain or swelling, hemoptysis or exogenous hormones ?  ? ? ?Past Medical History:  ?Diagnosis Date  ? A-fib ([facility])   ? AICD (automatic cardioverter/defibrillator) present   ? Allergy   ? Anemia   ? Arrhythmia   ? Atrial fibrillation ([facility])   ? Broken femur ([facility]) 2017  ? Cataract   ? Celiac disease   ? Chronic pain syndrome   ? Chronic systolic CHF (congestive heart failure) ([facility])   ? CKD (chronic kidney disease)   ? COPD (chronic obstructive pulmonary disease) ([facility])   ? Dilated cardiomyopathy ([facility])   ? Dysrhythmia   ? Hyperlipidemia   ? Hypertension   ? Iron deficiency anemia 12/09/2020  ? Migraines   ? Mitral valve regurgitation   ? Osteoporosis   ? Posterior rhinorrhea   ? Presence of permanent cardiac pacemaker   ? Scoliosis deformity of spine   ? Senile hyperkeratosis   ? Thyroid disease   ? hypothyroid  ? Ventricular tachyarrhythmia ([facility])   ? ? ?Past Surgical History:  ?Procedure Laterality Date  ? ablation arrythmia focus    ? APPENDECTOMY  1945  ? CARDIAC DEFIBRILLATOR PLACEMENT  2016  ? CARDIAC SURGERY    ?  CARDIOVERSION EXTERNAL X4    ? COLONOSCOPY N/A 04/10/2021  ? Procedure: COLONOSCOPY;  Surgeon: Jaynie Collins, DO;  Location: [facility] ENDOSCOPY;  Service: Gastroenterology;  Laterality: N/A;  ? CORONARY ARTERY BYPASS GRAFT    ? ESOPHAGOGASTRODUODENOSCOPY (EGD) WITH PROPOFOL N/A 04/10/2021  ? Procedure: ESOPHAGOGASTRODUODENOSCOPY (EGD) WITH PROPOFOL;  Surgeon: Jaynie Collins, DO;  Location: [facility] ENDOSCOPY;  Service: Gastroenterology;  Laterality: N/A;  ? PACEMAKER IMPLANT    ? REPLACEMENT TOTAL KNEE RIGHT  2016  ? TONSILLECTOMY  1945  ? ? ? ?Physical Exam  ? ?Triage Vital Signs: ?ED Triage Vitals  ?Enc Vitals Group  ?   BP 10/18/21 0614 (!) 147/87  ?   Pulse Rate 10/18/21 0614 80  ?   Resp 10/18/21 0614 (!) 34  ?   Temp 10/18/21 0614 98 ?F (36.7 ?C)  ?   Temp Source 10/18/21 0614 Oral  ?   SpO2 10/18/21 0614 95 %  ?   Weight 10/18/21 0616 113 lb (51.3 kg)  ?   Height 10/18/21 0616 5\' 2"  (1.575 m)  ?   Head Circumference --   ?   Peak Flow --   ?   Pain Score 10/18/21 0616 0  ?   Pain Loc --   ?   Pain Edu? --   ?  Excl. in GC? --   ? ? ?Most recent vital signs: ?Vitals:  ? 10/18/21 0630 10/18/21 0700  ?BP: 126/71 117/81  ?Pulse: 79 79  ?Resp: (!) 25 (!) 21  ?Temp:    ?SpO2: 93% 99%  ? ? ? ?Constitutional: Alert and oriented, mild respiratory distress. ?HEENT: ?     Head: Normocephalic and atraumatic.    ?     Eyes: Conjunctivae are normal. Sclera is non-icteric.  ?     Mouth/Throat: Mucous membranes are moist.  ?     Neck: Supple with no signs of meningismus. ?Cardiovascular: Regular rate and rhythm. No murmurs, gallops, or rubs. 2+ symmetrical distal pulses are present in all extremities.  ?Respiratory: Tachypneic satting 95% on 2 L with crackles bilaterally ?Gastrointestinal: Soft, non tender, and non distended with positive bowel sounds. No rebound or guarding. ?Genitourinary: No CVA tenderness. ?Musculoskeletal: Trace edema bilaterally ?Neurologic: Normal speech and language. Face is symmetric. Moving  all extremities. No gross focal neurologic deficits are appreciated. ?Skin: Skin is warm, dry and intact. No rash noted. ?Psychiatric: Mood and affect are normal. Speech and behavior are normal. ? ?ED Results / Procedures / Treatments  ? ?Labs ?(all labs ordered are listed, but only abnormal results are displayed) ?Labs Reviewed  ?BRAIN NATRIURETIC PEPTIDE - Abnormal; Notable for the following components:  ?    Result Value  ? B Natriuretic Peptide 372.4 (*)   ? All other components within normal limits  ?BASIC METABOLIC PANEL - Abnormal; Notable for the following components:  ? Chloride 90 (*)   ? CO2 39 (*)   ? BUN 27 (*)   ? GFR, Estimated 59 (*)   ? All other components within normal limits  ?CBC WITH DIFFERENTIAL/PLATELET - Abnormal; Notable for the following components:  ? RBC 3.64 (*)   ? Hemoglobin 11.7 (*)   ? MCV 106.6 (*)   ? RDW 16.4 (*)   ? All other components within normal limits  ?RESP PANEL BY RT-PCR (FLU A&B, COVID) ARPGX2  ?PROCALCITONIN  ?TROPONIN I (HIGH SENSITIVITY)  ? ? ? ?EKG ? ?ED ECG REPORT ?I, Nita Sickle, the attending physician, personally viewed and interpreted this ECG. ? ?Sinus rhythm with a rate of 80, right bundle branch block, no ST elevations or depressions.  Unchanged when compared to prior ? ?RADIOLOGY ?I, Nita Sickle, attending MD, have personally viewed and interpreted the images obtained during this visit as below: ? ?Chest x-ray showing hyperinflation with no pneumonia ? ? ?___________________________________________________ ?Interpretation by Radiologist:  ?DG Chest Portable 1 View ? ?Result Date: 10/18/2021 ?CLINICAL DATA:  88 year old female with shortness of breath. EXAM: PORTABLE CHEST 1 VIEW COMPARISON:  Chest radiographs 09/15/2021 and earlier. FINDINGS: Portable AP semi upright view at 0622 hours. Chronic increased AP dimension to the chest and pulmonary hyperinflation. Stable cardiac size and mediastinal contours. Mild cardiomegaly and tortuous thoracic  aorta. Stable left chest AICD. Visualized tracheal air column is within normal limits. Evidence of a small new left pleural effusion since March. But no pneumothorax, pulmonary edema or other acute pulmonary opacity. Stable lung markings allowing for portable technique. Thoracic kyphoscoliosis. No acute osseous abnormality identified. Negative visible bowel gas. IMPRESSION: 1. Chronic pulmonary hyperinflation with evidence of a small new left pleural effusion since March. 2. No other acute cardiopulmonary abnormality. Electronically Signed   By: Odessa Fleming M.D.   On: 10/18/2021 06:36   ? ? ? ? ?PROCEDURES: ? ?Critical Care performed: yes ? ?.Critical Care ?Performed by: Don Perking, [place],  MD ?Authorized by: Nita SickleVeronese, [place], MD  ? ?Critical care provider statement:  ?  Critical care time (minutes):  30 ?  Critical care time was exclusive of:  Separately billable procedures and treating other patients ?  Critical care was necessary to treat or prevent imminent or life-threatening deterioration of the following conditions:  Respiratory failure, circulatory failure, CNS failure or compromise and cardiac failure ?  Critical care was time spent personally by me on the following activities:  Development of treatment plan with patient or surrogate, discussions with consultants, evaluation of patient's response to treatment, examination of patient, ordering and review of laboratory studies, ordering and review of radiographic studies, ordering and performing treatments and interventions, pulse oximetry, re-evaluation of patient's condition and review of old charts ?  I assumed direction of critical care for this patient from another provider in my specialty: no   ? ? ? ?IMPRESSION / MDM / ASSESSMENT AND PLAN / ED COURSE  ?I reviewed the triage vital signs and the nursing notes. ? ?88 y.o. female with history of A-fib on Xarelto, torsades status post AICD, CHF, COPD, chronic respiratory failure on 2 L nasal cannula,  hypertension, hyperlipidemia, anemia on iron infusion, pacemaker who presents from her nursing home for shortness of breath.  Patient hypoxic per EMS.  Here she is satting well on her 2 L.  She has trace pitting edema b

## 2021-10-26 DIAGNOSIS — Z95810 Presence of automatic (implantable) cardiac defibrillator: Secondary | ICD-10-CM

## 2021-11-10 ENCOUNTER — Other Ambulatory Visit (INDEPENDENT_AMBULATORY_CARE_PROVIDER_SITE_OTHER): Payer: Medicare Other | Admitting: Cardiovascular Disease

## 2021-11-10 LAB — REMOTE CARDIAC DEVICE MONITORING
AF Burden Percentage: 39
ICD Shock Recent Count: 0
RV Pacing Percentage: 95

## 2021-12-03 ENCOUNTER — Non-Acute Institutional Stay: Payer: Medicare Other | Admitting: Student

## 2021-12-03 DIAGNOSIS — E43 Unspecified severe protein-calorie malnutrition: Secondary | ICD-10-CM

## 2021-12-03 DIAGNOSIS — R531 Weakness: Secondary | ICD-10-CM

## 2021-12-03 DIAGNOSIS — S31000D Unspecified open wound of lower back and pelvis without penetration into retroperitoneum, subsequent encounter: Secondary | ICD-10-CM

## 2021-12-03 DIAGNOSIS — R0602 Shortness of breath: Secondary | ICD-10-CM

## 2021-12-03 DIAGNOSIS — Z515 Encounter for palliative care: Secondary | ICD-10-CM

## 2021-12-03 DIAGNOSIS — R52 Pain, unspecified: Secondary | ICD-10-CM

## 2021-12-03 NOTE — Progress Notes (Signed)
[facility] Pall[facility] Consult Note Telephone: [phone]  Fax: [phone]   Date of encounter: 12/03/21 11:38 AM PATIENT NAME: Mackenzie Walsh [id number]   [id number] (home)  DOB: 11-25-1931 MRN: [id number] PRIMARY CARE PROVIDER:    Leonel Ramsay, MD,  [place] [address] [phone]  REFERRING PROVIDER:   Dr. Frazier Richards  RESPONSIBLE PARTY:    Contact Information     Name Relation Home Work Mobile   [place] Daughter   [phone]   Jimeka, Balan   [phone]   Valoria, Tamburri   [phone]        I met face to face with patient and family in the facility. Palliative Care was asked to follow this patient by consultation request of  Dr. Frazier Richards to address advance care planning and complex medical decision making. This is the initial visit.                                     ASSESSMENT AND PLAN / RECOMMENDATIONS:   Advance Care Planning/Goals of Care: Goals include to maximize quality of life and symptom management. Patient/health care surrogate gave his/her permission to discuss.Our advance care planning conversation included a discussion about:    The value and importance of advance care planning  Experiences with loved ones who have been seriously ill or have died  Exploration of personal, cultural or spiritual beliefs that might influence medical decisions  Exploration of goals of care in the event of a sudden injury or illness  Sons Ronalee Belts and Eddie Dibbles share HCPOA CODE STATUS: DNR  Education on Palliative Medicine vs. Hospice services. Patient expresses not wanting to prolong life. She affirms DNR, manage in facility. She is agreeable to hospice services. She expresses comfort and wishes to cut down on non essential medications.  I spent 40 minutes providing this consultation. More than 50% of the time in this consultation was  spent in counseling and care coordination.  -------------------------------------------------------------------------------------------------------------------------------------  Symptom Management/Plan:  Pain-patient with chronic pain, unstageable wound to sacrum. She will not always ask for medications. Recommend tramadol 50 mg TID routinely and continue PRN tramadol, acetaminophen routinely. Turn and reposition for comfort; geomatt cushion on bed. Unstageable sacral wound-Routine wound care per facility, hydrogel and Mepilex every other day as directed.   Dyspnea-secondary to COPD, respiratory failure, hypertensive heart disease with heart failure. Patient with worsening shortness of breath. Continue oxygen at  2lpm via nasal canula continuously, Duoneb QID PRN shortness of breath/wheezing.  Protein calorie malnutrition-patient with poor appetite; she is thin, very frail. Last albumin 3.0 6 months ago. She is expresses not wanting to "prolong things." She will eat foods she enjoys. Will monitor for worsening dysphagia. Currently receiving gluten free diet with regular consistency.   Generalized weakness-patient currently has OT/PT ordered. She expresses not wanting to continue therapy, wishes to remain in bed for comfort. Education provided on staff repositioning for comfort.   Follow up Palliative Care Visit: Palliative care will continue to follow for complex medical decision making, advance care planning, and clarification of goals. Return  weeks or prn.   This visit was coded based on medical decision making (MDM).  PPS: 30%  HOSPICE ELIGIBILITY/DIAGNOSIS: TBD  Chief Complaint: Palliative Medicine initial consult.   HISTORY OF PRESENT ILLNESS:  Mackenzie Walsh is a 88 y.o. year old female  with COPD, respiratory failure, dysphagia, atrial fibrillation, hypertension, hypertensive heart and kidney disease with heart failure, hypothyroidism, protein calorie malnutrition, GERD, celiac  disease.  Patient moved to SNF. Endorses increased fatigue, shortness of breath. Endorses back pain, pain to sacrum. She had increased difficulty ambulating. She had been using the walker with assist x 1 in her apartment,  to using w/c in past week. She is now bed bound; she has orders for PT/OT but only able to sit up to side of bed for short period in the last day. No recent falls. Has increased difficulty chewing and swallowing. Hx of dysphagia. She ate 10% for breakfast this morning. Her intake has been around 25%.   Patient received resting in bed; she is weak, pale in color, she is wearing oxygen via nasal canula at 2 lpm. She is short of breath and has to pause in between speaking. She also grimaces at times throughout visit; expresses pain to back and wound.   History obtained from review of EMR, discussion with primary team, and interview with family, facility staff/caregiver and/or Ms. Mikkelsen.  I reviewed available labs, medications, imaging, studies and related documents from the EMR.  Records reviewed and summarized above.    Physical Exam: Height 5'8"  last weight 113 pounds 4/23; her healthy weight was 140 pounds Pulse 70 apical/irregular, resp 18, sats 94% on 2 lpm Constitutional: NAD, ill appearing  General: frail appearing, thin  EYES: anicteric sclera, lids intact, no discharge  ENMT: intact hearing, oral mucous membranes moist, voice is weak CV: S1S2,  no LE edema Pulmonary: bases diminished, increased work of breathing, no cough Abdomen:  normo-active BS + 4 quadrants, soft and non tender GU: deferred MSK: + sarcopenia, non-ambulatory Skin: cool and dry, no rashes or wounds on visible skin Neuro: + generalized weakness Psych: non-anxious affect, A and O x 3 Hem/lymph/immuno: no widespread bruising CURRENT PROBLEM LIST:  Patient Active Problem List   Diagnosis Date Noted   Malnutrition of moderate degree 05/29/2021   Closed fracture of multiple pubic rami, right,  initial encounter ([facility]) 05/28/2021   Fall 05/26/2021   A-fib (HCC)    COPD (chronic obstructive pulmonary disease) (HCC)    Hyperlipidemia    Hypertension    Hypothyroidism    CKD (chronic kidney disease), stage IIIa    Macrocytic anemia    Pubic ramus fracture ([facility])    COVID-19 virus infection    Normocytic anemia 12/09/2020   Anemia in stage 3b chronic kidney disease ([facility]) 12/09/2020   Weight loss 12/09/2020   Iron deficiency anemia 12/09/2020   Abnormal SPEP 05/28/2020   Macrocytosis 05/28/2020   COPD with chronic bronchitis (HCC) 01/16/2020   Chronic diastolic CHF (congestive heart failure) ([facility] [facility]) 01/16/2020   Chronic anticoagulation 01/16/2020   Acute respiratory failure with hypoxia ([place] [facility]) 01/16/2020   Fall at home, initial encounter 01/16/2020   Compression fracture of thoracic spine, non-traumatic ([facility] [facility]) 01/16/2020   Hypoxia 01/16/2020   PAST MEDICAL HISTORY:  Active Ambulatory Problems    Diagnosis Date Noted   COPD with chronic bronchitis (HCC) 01/16/2020   Chronic diastolic CHF (congestive heart failure) ([place]) 01/16/2020   Chronic anticoagulation 01/16/2020   Acute respiratory failure with hypoxia ([place]) 01/16/2020   Fall at home, initial encounter 01/16/2020   Compression fracture of thoracic spine, non-traumatic (HCC) 01/16/2020   Hypoxia 01/16/2020   Abnormal SPEP 05/28/2020   Macrocytosis 05/28/2020   Normocytic anemia 12/09/2020   Anemia in stage 3b chronic kidney disease ([place]) 12/09/2020  Weight loss 12/09/2020   Iron deficiency anemia 12/09/2020   Fall 05/26/2021   A-fib ([facility])    COPD (chronic obstructive pulmonary disease) (HCC)    Hyperlipidemia    Hypertension    Hypothyroidism    CKD (chronic kidney disease), stage IIIa    Macrocytic anemia    Pubic ramus fracture ([place])    COVID-19 virus infection    Closed fracture of multiple pubic rami, right, initial encounter ([facility]) 05/28/2021   Malnutrition of moderate degree 05/29/2021   Resolved  Ambulatory Problems    Diagnosis Date Noted   No Resolved Ambulatory Problems   Past Medical History:  Diagnosis Date   AICD (automatic cardioverter/defibrillator) present    Allergy    Anemia    Arrhythmia    Atrial fibrillation ([place])    Broken femur ([facility]) 2017   Cataract    Celiac disease    Chronic pain syndrome    Chronic systolic CHF (congestive heart failure) (HCC)    CKD (chronic kidney disease)    Dilated cardiomyopathy (HCC)    Dysrhythmia    Migraines    Mitral valve regurgitation    Osteoporosis    Posterior rhinorrhea    Presence of permanent cardiac pacemaker    Scoliosis deformity of spine    Senile hyperkeratosis    Thyroid disease    Ventricular tachyarrhythmia ([place])    SOCIAL HX:  Social History   Tobacco Use   Smoking status: Never   Smokeless tobacco: Never  Substance Use Topics   Alcohol use: Not Currently   FAMILY HX:  Family History  Problem Relation Age of Onset   Pneumonia Mother    Osteoporosis Mother    Stroke Father    Heart failure Father    Hypertension Father    Obesity Father    Heart failure Sister       ALLERGIES:  Allergies  Allergen Reactions   Erythromycin Base Hives   Gluten Meal Other (See Comments)    Celiac Disease   Dofetilide Rash     PERTINENT MEDICATIONS:  Outpatient Encounter Medications as of 12/03/2021  Medication Sig   Acetaminophen 500 MG capsule Take 2 capsules (1,000 mg total) by mouth 3 times/day as needed-between meals & bedtime.   albuterol (PROVENTIL) (2.5 MG/3ML) 0.083% nebulizer solution Take 3 mLs (2.5 mg total) by nebulization every 4 (four) hours as needed for wheezing or shortness of breath.   amiodarone (PACERONE) 200 MG tablet Take 200 mg by mouth daily.    bifidobacterium infantis (ALIGN) capsule Take 1 capsule by mouth daily.   CALCIUM CITRATE PO Take 600 mg by mouth daily.   carvedilol (COREG) 12.5 MG tablet Take 12.5 mg by mouth 2 (two) times daily with a meal.    CITRACAL MAXIMUM  315-250 MG-UNIT TABS Take 1 tablet by mouth daily.   Coenzyme Q10 (COQ-10) 100 MG CAPS Take 100 mg by mouth daily.    fluticasone (FLONASE) 50 MCG/ACT nasal spray Place 1-2 sprays into both nostrils daily.    gabapentin (NEURONTIN) 300 MG capsule Take 300 mg by mouth 3 (three) times daily.    levothyroxine (SYNTHROID) 112 MCG tablet Take 112 mcg by mouth daily before breakfast.    Magnesium Oxide 200 MG TABS Take 200 mg by mouth daily.    Multiple Vitamin (MULTI-VITAMIN DAILY PO) Take 1 tablet by mouth daily.    omeprazole (PRILOSEC) 40 MG capsule Take 40 mg by mouth daily.   Rivaroxaban (XARELTO) 15 MG TABS tablet Take 15  mg by mouth daily with supper.    simvastatin (ZOCOR) 10 MG tablet Take 10 mg by mouth at bedtime.   No facility-administered encounter medications on file as of 12/03/2021.   Thank you for the opportunity to participate in the care of Ms. Vantassel.  The palliative care team will continue to follow. Please call our office at [phone] if we can be of additional assistance.   Ezekiel Slocumb, NP   COVID-19 PATIENT SCREENING TOOL Asked and negative response unless otherwise noted:  Have you had symptoms of covid, tested positive or been in contact with someone with symptoms/positive test in the past 5-10 days? No

## 2021-12-16 ENCOUNTER — Telehealth: Payer: Self-pay | Admitting: Oncology

## 2022-01-25 DIAGNOSIS — Z95810 Presence of automatic (implantable) cardiac defibrillator: Secondary | ICD-10-CM

## 2022-02-20 DEATH — deceased

## 2022-03-06 ENCOUNTER — Encounter: Payer: Self-pay | Admitting: Oncology

## 2022-03-13 ENCOUNTER — Ambulatory Visit: Payer: Medicare Other

## 2022-03-18 ENCOUNTER — Ambulatory Visit: Payer: Medicare Other | Admitting: Oncology

## 2022-03-18 ENCOUNTER — Ambulatory Visit: Payer: Medicare Other

## 2022-07-17 IMAGING — RF DG ESOPHAGUS
8 of 9 series · 14 of 16 positions shown · non-contrast
Comparison: CT chest 01/16/2020.

CLINICAL DATA: Dysphagia.  Difficulty swallowing pills.

EXAM:
ESOPHOGRAM / BARIUM SWALLOW / BARIUM TABLET STUDY
TECHNIQUE: Combined double contrast and single contrast examination performed
using effervescent crystals, thick barium liquid, and thin barium
liquid. The patient was observed with fluoroscopy swallowing a 13 mm
barium sulphate tablet.
FLUOROSCOPY TIME:  Fluoroscopy Time:  4 minutes 36 seconds
Radiation Exposure Index (if provided by the fluoroscopic device):
54.4 mGy

[Series 1: fluoro_barium 2fps_bw · 0.17mm/px · 3 of 15 frames shown (1 of 8)]
[frame 3/15]
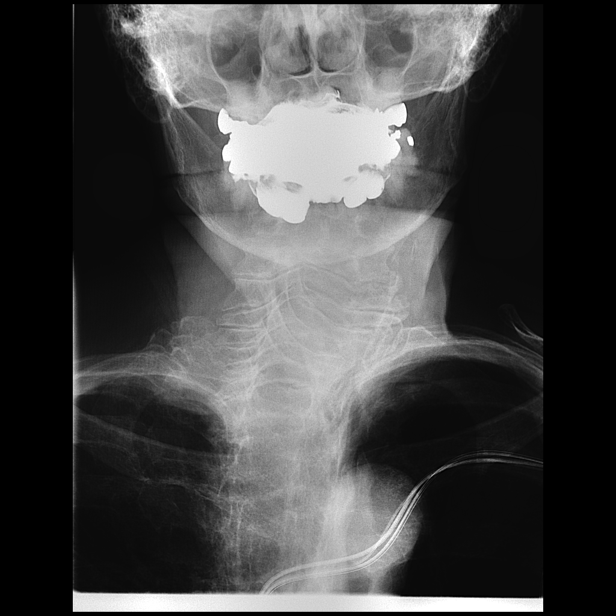
[frame 8/15]
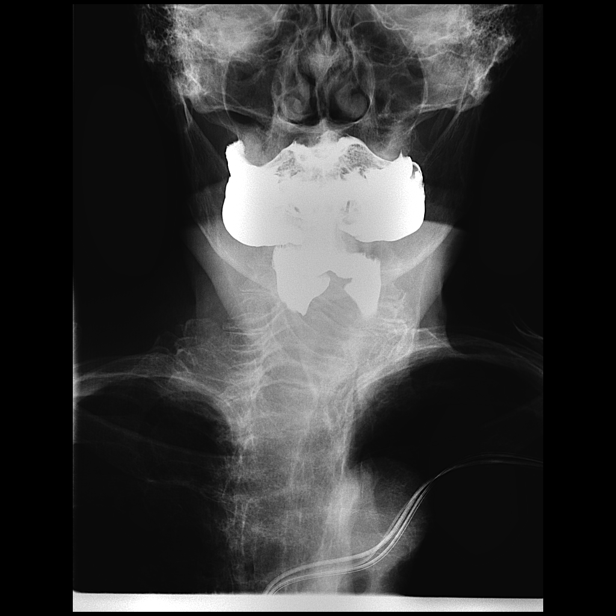
[frame 13/15]
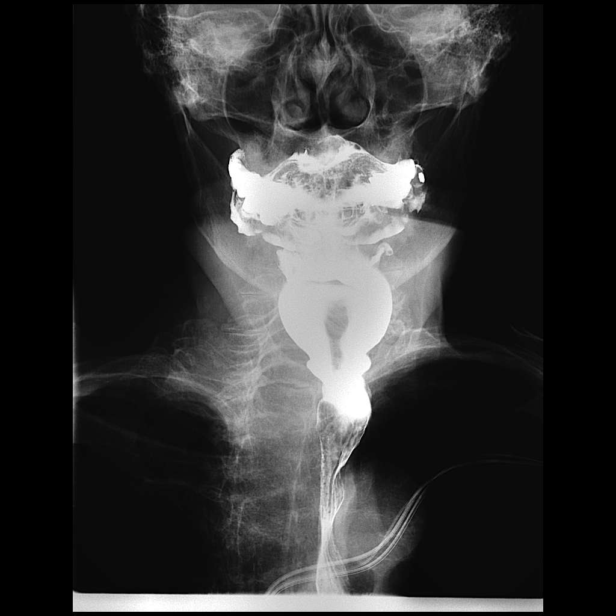

[Series 2: fluoro_barium 2fps_bw · 0.17mm/px · 1 of 2 frames shown (2 of 8)]
[frame 2/2]
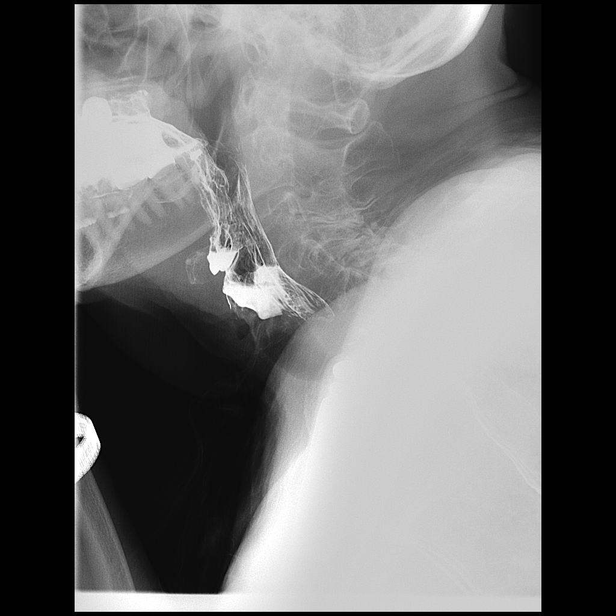

[Series 3: fluoro_barium 2fps_bw · 0.17mm/px · 3 of 18 frames shown (3 of 8)]
[frame 3/18]
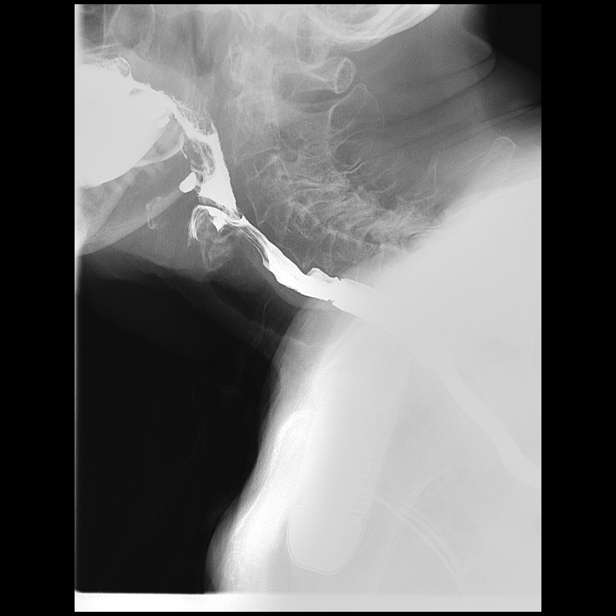
[frame 10/18]
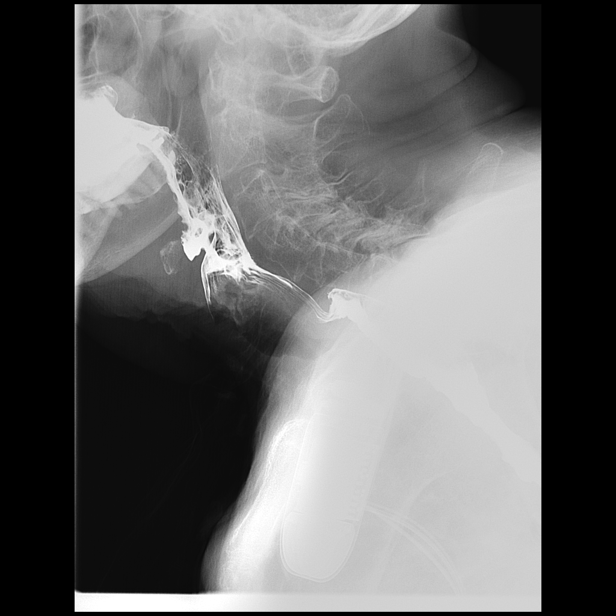
[frame 16/18]
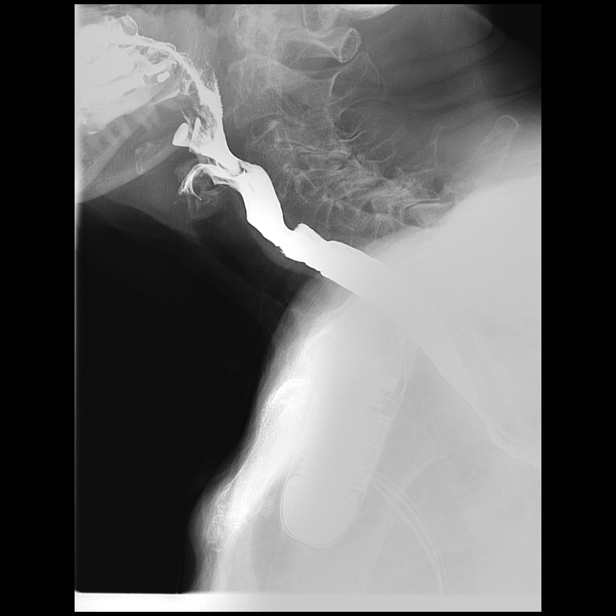

[Series 4: fluoro_barium 2fps_bw · 0.17mm/px · 1 of 1 slices shown (4 of 8)]
[im 1/1]
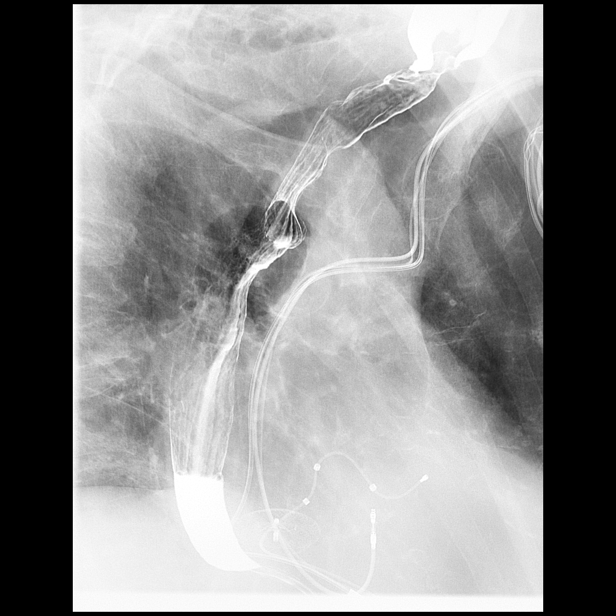

[Series 5: fluoro_barium 2fps_bw · 0.17mm/px · 1 of 1 slices shown (5 of 8)]
[im 1/1]
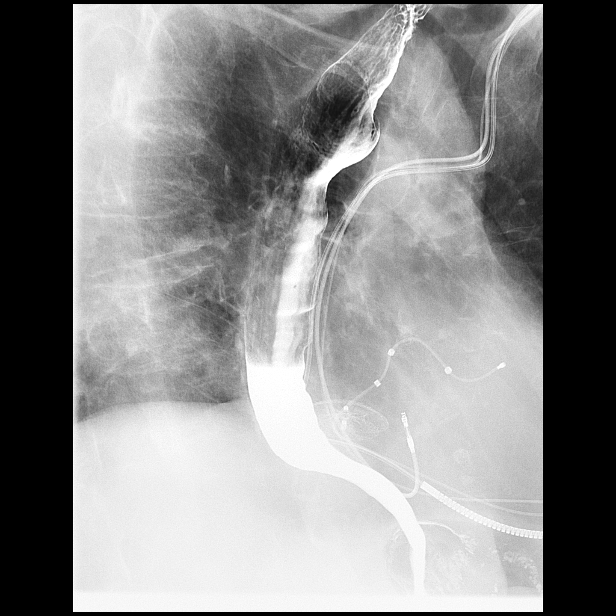

[Series 6: fluoro_barium 2fps_bw · 0.18mm/px · 1 of 1 slices shown (6 of 8)]
[im 1/1]
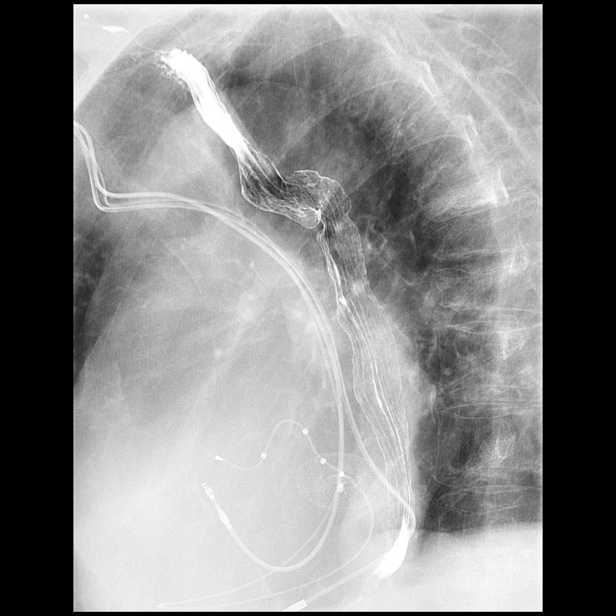

[Series 8: fluoro_barium 2fps_bw · 0.18mm/px · 3 of 3 frames shown (7 of 8)]
[frame 1/3]
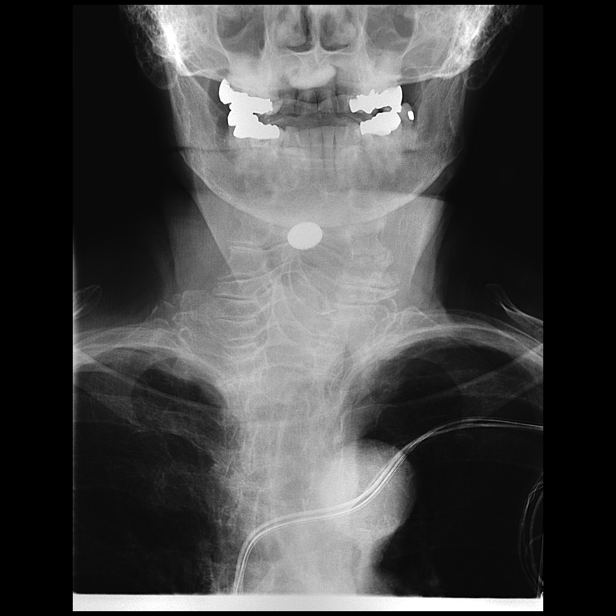
[frame 2/3]
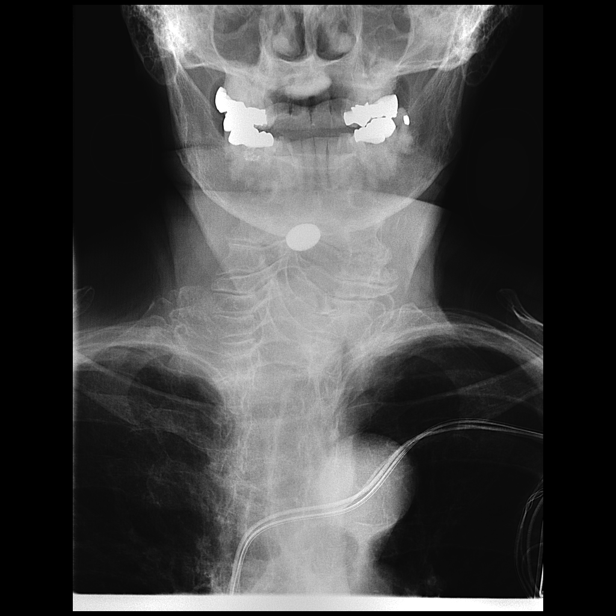
[frame 3/3]
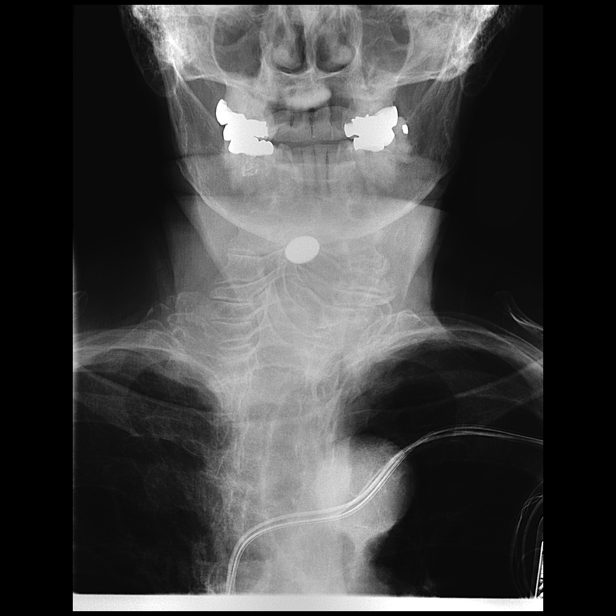

[Series 9: fluoro_barium 2fps_bw · 0.17mm/px · 1 of 1 slices shown (8 of 8)]
[im 1/1]
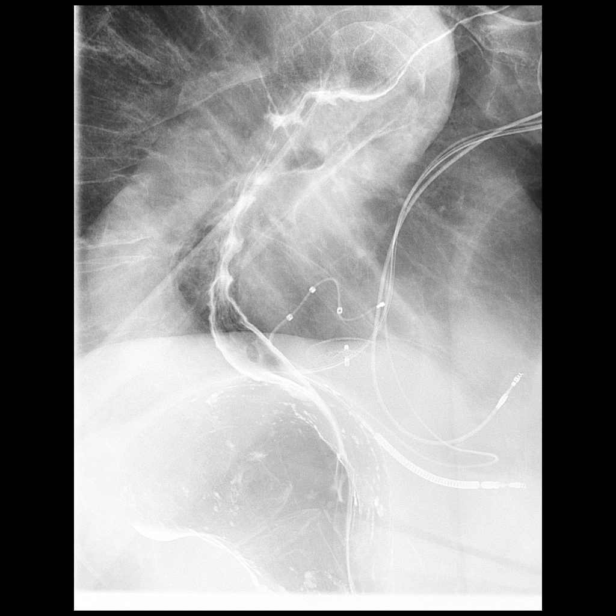

[14 of 16 positions shown; findings below may reference images not displayed]

FINDINGS: Mild penetration cannot be excluded. Mild prominence of the
cricopharyngeus muscle noted. Thoracic esophagus is widely patent.
Tertiary esophageal contractions noted consistent presbyesophagus.
No hiatal hernia. Patient had difficulty swallowing barium tablet.
The barium tablet was retained in a piriform sinuses. The barium
tablet eventually passed with drinking. Barium tablet easily passed
into the stomach.
IMPRESSION: 1. Mild penetration cannot be excluded. Mild prominence of the
cricopharyngeus muscle noted.
2. Thoracic esophagus is widely patent. Tertiary esophageal
contractions noted consistent with presbyesophagus. No hiatal hernia
or reflux.
3. Patient had difficulty swallowing barium tablet. The barium
tablet was retained in a piriform sinus. The barium tablet
eventually passed with drinking. Barium tablet easily passed into
the stomach.

## 2023-04-08 IMAGING — US US SOFT TISSUE HEAD/NECK
1 series · 14 of 19 positions shown · non-contrast
Comparison: None.

CLINICAL DATA: 88-year-old female with a history neck mass

EXAM:
ULTRASOUND OF HEAD/NECK SOFT TISSUES
TECHNIQUE: Ultrasound examination of the head and neck soft tissues was
performed in the area of clinical concern.

[Series 1: us soft tissue head/neck · 0.07mm/px · 14 of 19 slices shown]
[im 1/19]
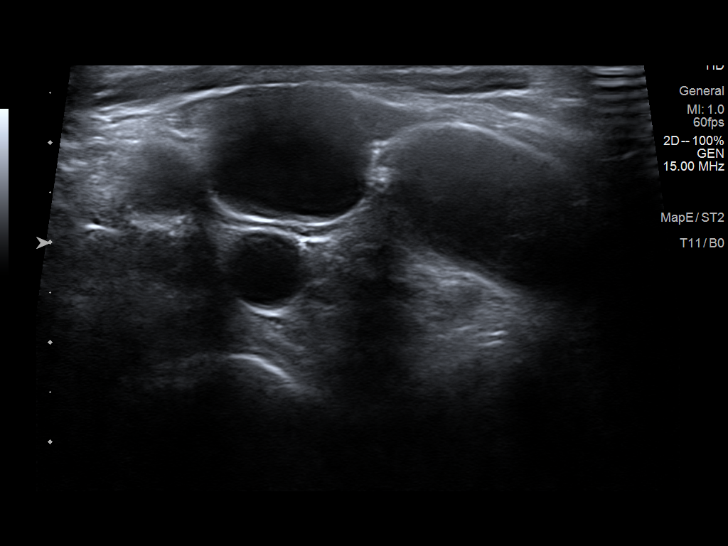
[im 3/19]
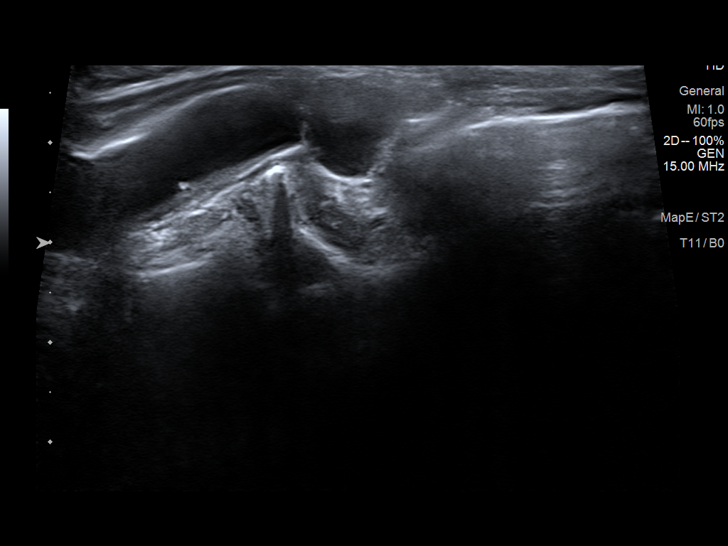
[im 4/19]
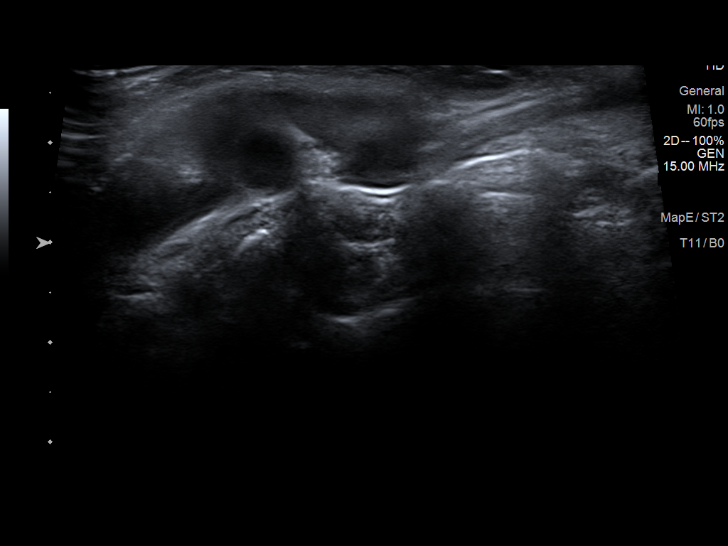
[im 5/19]
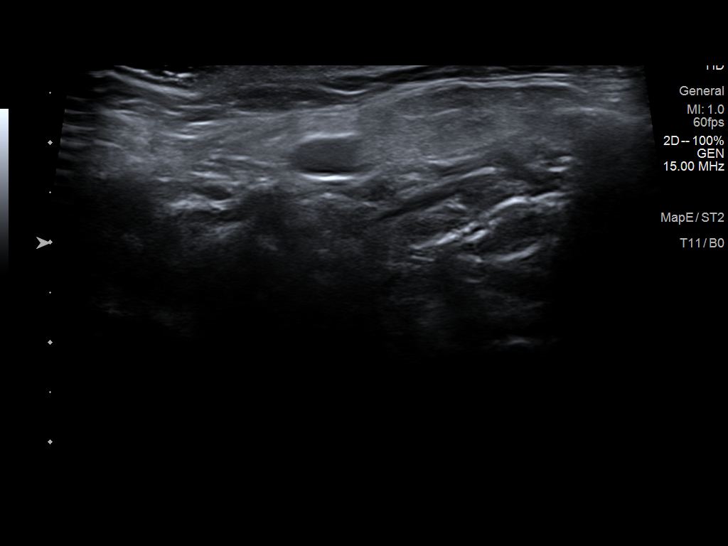
[im 7/19]
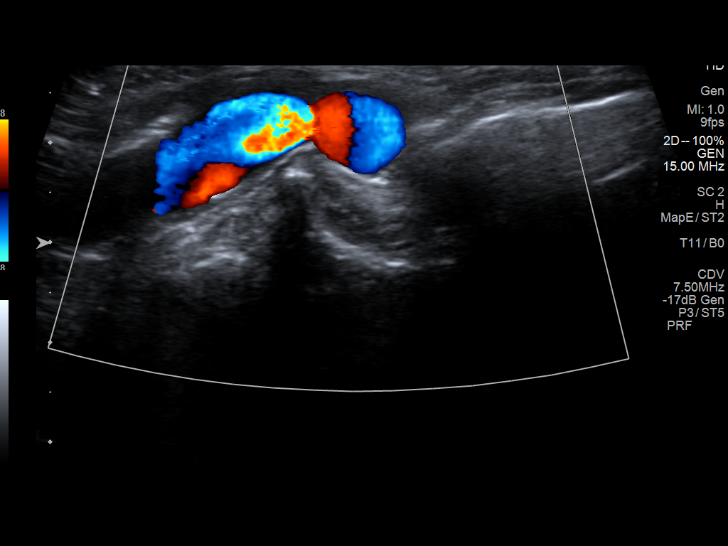
[im 8/19]
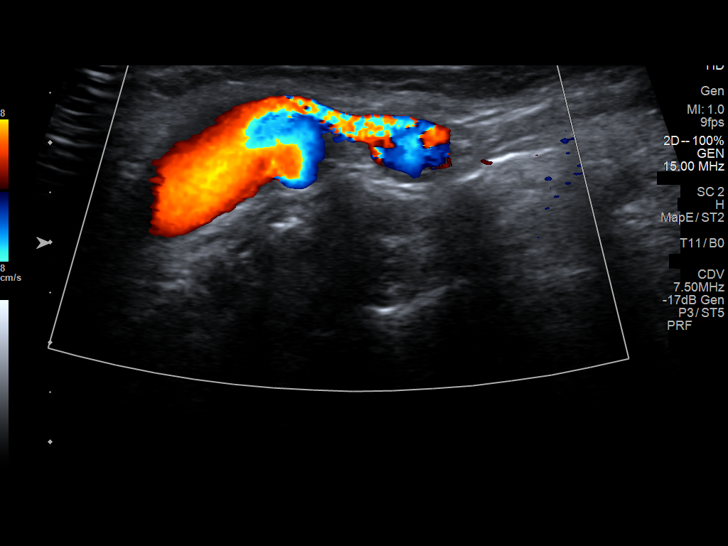
[im 9/19]
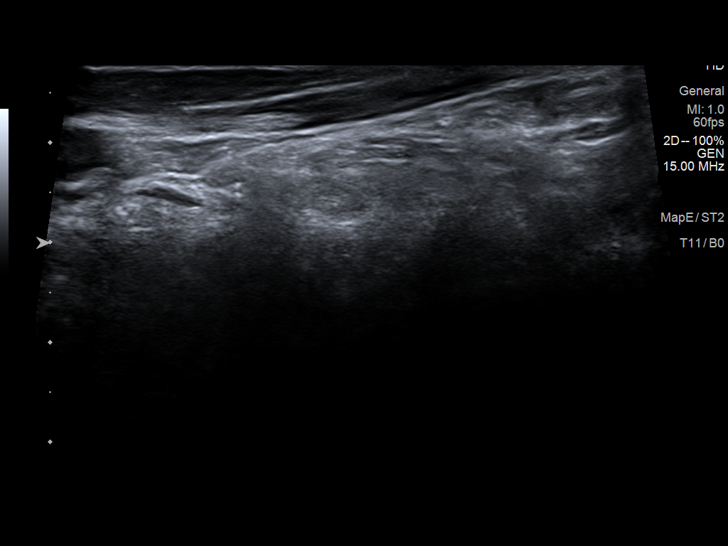
[im 11/19]
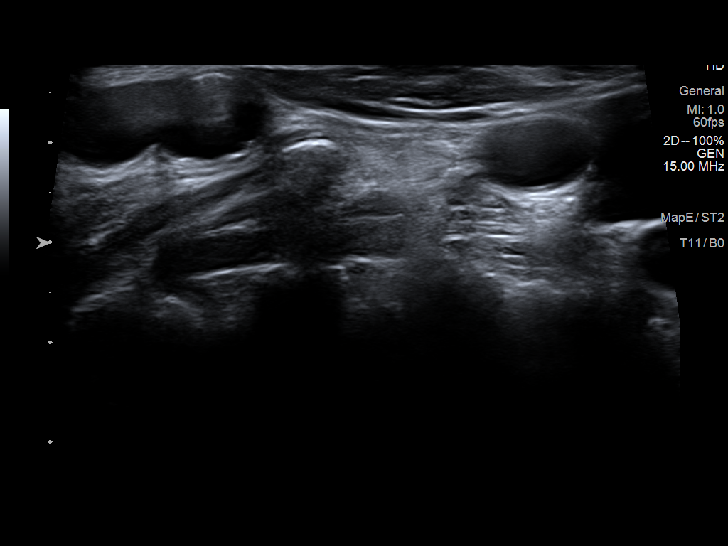
[im 12/19]
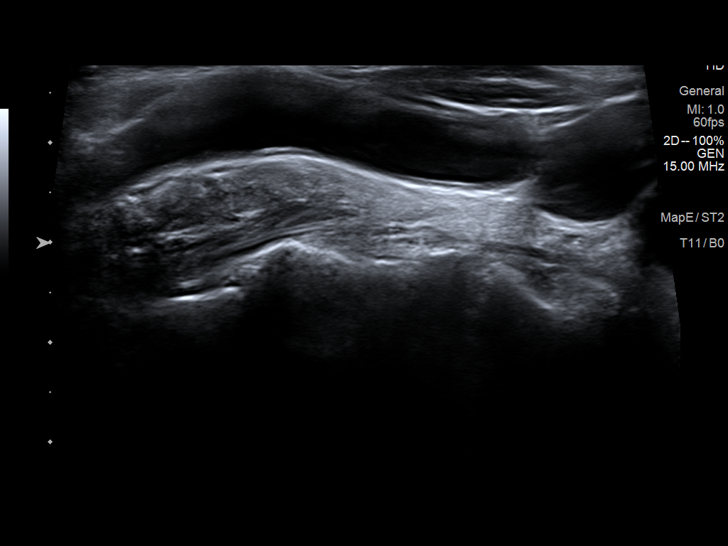
[im 13/19]
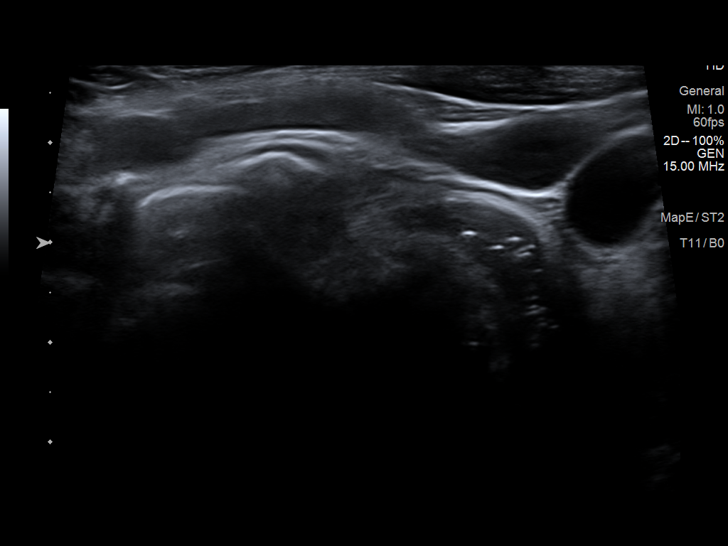
[im 15/19]
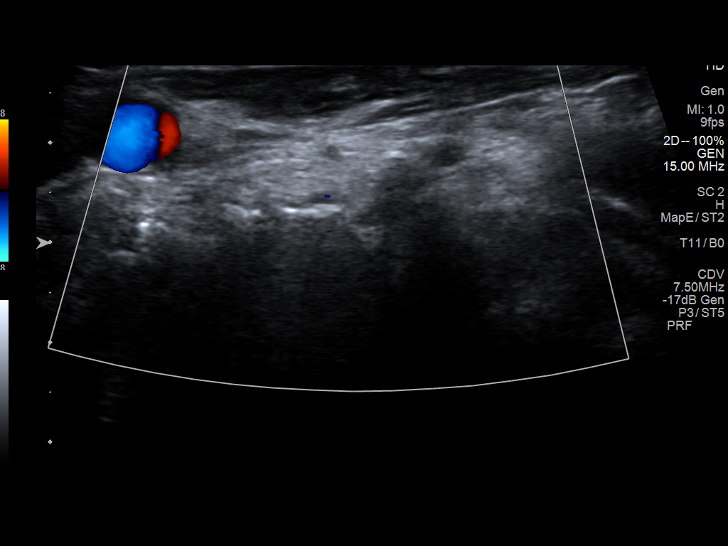
[im 16/19]
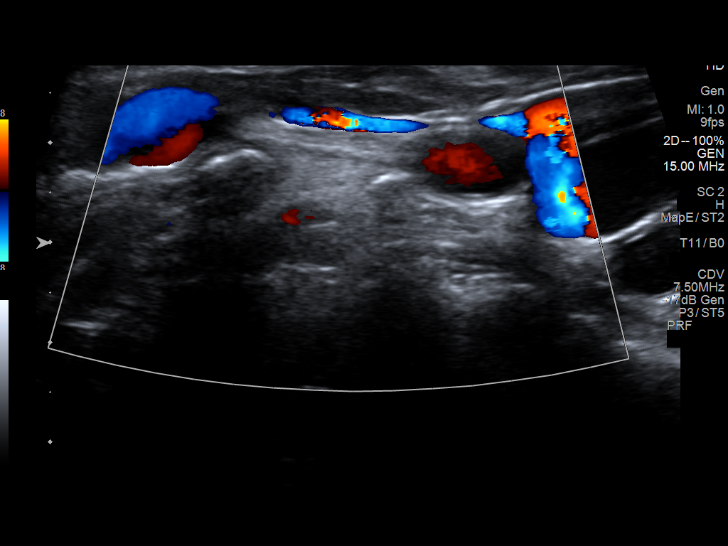
[im 17/19]
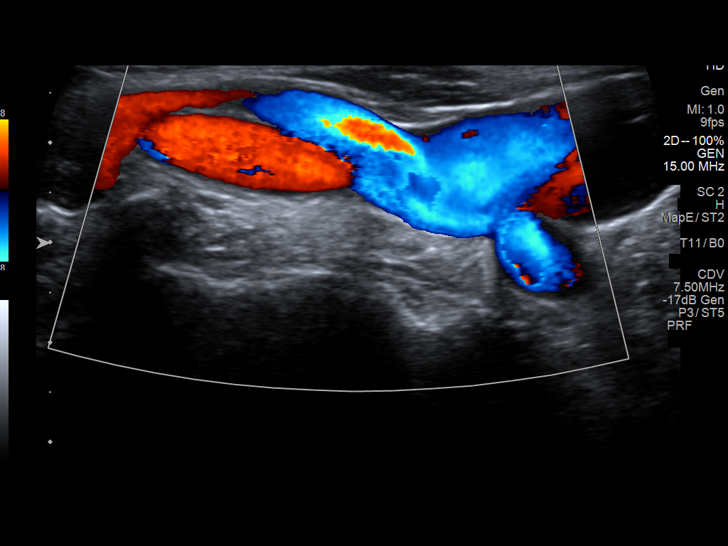
[im 19/19]
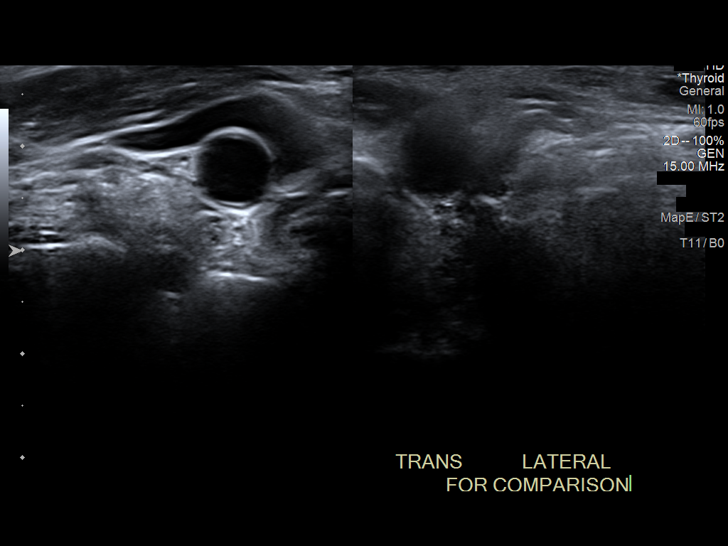

[14 of 19 positions shown; findings below may reference images not displayed]

FINDINGS: Grayscale and color duplex performed in the region of clinical
concern.

No focal fluid.  No adenopathy.  No soft tissue lesion.
IMPRESSION: Unremarkable sonographic survey in the region of clinical concern
